# Patient Record
Sex: Male | Born: 1960 | Hispanic: Yes | Smoking: Former smoker
Health system: Southern US, Community
[De-identification: ages and names within clinical notes are randomized; demographics above are authoritative.]

## PROBLEM LIST (undated history)

## (undated) DIAGNOSIS — I219 Acute myocardial infarction, unspecified: Secondary | ICD-10-CM

## (undated) DIAGNOSIS — D759 Disease of blood and blood-forming organs, unspecified: Secondary | ICD-10-CM

## (undated) DIAGNOSIS — D696 Thrombocytopenia, unspecified: Secondary | ICD-10-CM

## (undated) DIAGNOSIS — R413 Other amnesia: Secondary | ICD-10-CM

## (undated) DIAGNOSIS — N189 Chronic kidney disease, unspecified: Secondary | ICD-10-CM

## (undated) DIAGNOSIS — T7840XA Allergy, unspecified, initial encounter: Secondary | ICD-10-CM

## (undated) DIAGNOSIS — E785 Hyperlipidemia, unspecified: Secondary | ICD-10-CM

## (undated) DIAGNOSIS — I10 Essential (primary) hypertension: Secondary | ICD-10-CM

## (undated) DIAGNOSIS — G4733 Obstructive sleep apnea (adult) (pediatric): Secondary | ICD-10-CM

## (undated) DIAGNOSIS — I5181 Takotsubo syndrome: Secondary | ICD-10-CM

## (undated) DIAGNOSIS — I639 Cerebral infarction, unspecified: Secondary | ICD-10-CM

## (undated) DIAGNOSIS — T4145XA Adverse effect of unspecified anesthetic, initial encounter: Secondary | ICD-10-CM

## (undated) DIAGNOSIS — T40601A Poisoning by unspecified narcotics, accidental (unintentional), initial encounter: Secondary | ICD-10-CM

## (undated) DIAGNOSIS — K5900 Constipation, unspecified: Secondary | ICD-10-CM

## (undated) DIAGNOSIS — G4730 Sleep apnea, unspecified: Secondary | ICD-10-CM

## (undated) DIAGNOSIS — R7303 Prediabetes: Secondary | ICD-10-CM

## (undated) DIAGNOSIS — J9600 Acute respiratory failure, unspecified whether with hypoxia or hypercapnia: Secondary | ICD-10-CM

## (undated) HISTORY — DX: Allergy, unspecified, initial encounter: T78.40XA

## (undated) HISTORY — DX: Acute myocardial infarction, unspecified: I21.9

## (undated) HISTORY — DX: Essential (primary) hypertension: I10

## (undated) HISTORY — DX: Cerebral infarction, unspecified: I63.9

## (undated) HISTORY — PX: KNEE SURGERY: SHX244

## (undated) HISTORY — DX: Sleep apnea, unspecified: G47.30

## (undated) HISTORY — DX: Takotsubo syndrome: I51.81

## (undated) HISTORY — PX: ACHILLES TENDON REPAIR: SUR1153

## (undated) HISTORY — PX: CORONARY ARTERY BYPASS GRAFT: SHX141

## (undated) HISTORY — PX: TRANSTHORACIC ECHOCARDIOGRAM: SHX275

---

## 1997-11-20 ENCOUNTER — Emergency Department (HOSPITAL_COMMUNITY): Admission: EM | Admit: 1997-11-20 | Discharge: 1997-11-20 | Payer: Self-pay | Admitting: Emergency Medicine

## 1999-04-14 ENCOUNTER — Encounter: Payer: Self-pay | Admitting: Family Medicine

## 1999-04-14 ENCOUNTER — Inpatient Hospital Stay (HOSPITAL_COMMUNITY): Admission: EM | Admit: 1999-04-14 | Discharge: 1999-04-17 | Payer: Self-pay | Admitting: Emergency Medicine

## 1999-04-16 ENCOUNTER — Encounter: Payer: Self-pay | Admitting: Family Medicine

## 1999-04-24 ENCOUNTER — Encounter: Admission: RE | Admit: 1999-04-24 | Discharge: 1999-04-24 | Payer: Self-pay | Admitting: Family Medicine

## 1999-04-28 ENCOUNTER — Encounter: Admission: RE | Admit: 1999-04-28 | Discharge: 1999-04-28 | Payer: Self-pay | Admitting: Family Medicine

## 2000-06-12 ENCOUNTER — Emergency Department (HOSPITAL_COMMUNITY): Admission: EM | Admit: 2000-06-12 | Discharge: 2000-06-12 | Payer: Self-pay | Admitting: Emergency Medicine

## 2005-09-25 ENCOUNTER — Ambulatory Visit (HOSPITAL_COMMUNITY): Admission: RE | Admit: 2005-09-25 | Discharge: 2005-09-25 | Payer: Self-pay | Admitting: Chiropractic Medicine

## 2005-10-14 ENCOUNTER — Ambulatory Visit (HOSPITAL_COMMUNITY): Admission: RE | Admit: 2005-10-14 | Discharge: 2005-10-14 | Payer: Self-pay | Admitting: Orthopedic Surgery

## 2007-12-10 ENCOUNTER — Ambulatory Visit (HOSPITAL_COMMUNITY): Admission: RE | Admit: 2007-12-10 | Discharge: 2007-12-10 | Payer: Self-pay | Admitting: Family Medicine

## 2010-06-30 ENCOUNTER — Encounter: Payer: Self-pay | Admitting: Family Medicine

## 2010-10-24 NOTE — Op Note (Signed)
NAMEPADRAIG, NHAN NO.:  [id number]   MEDICAL RECORD NO.:  [id number]          PATIENT TYPE:  AMB   LOCATION:  DAY                          FACILITY:  [facility]   PHYSICIAN:  Ollen Gross, M.D.    DATE OF BIRTH:  [birth year]   DATE OF PROCEDURE:  10/14/2005  DATE OF DISCHARGE:                                 OPERATIVE REPORT   PREOPERATIVE DIAGNOSIS:  Left knee medial meniscal tear.   POSTOPERATIVE DIAGNOSIS:  Left knee medial meniscal tear.   PROCEDURE:  Left knee arthroscopy with meniscal debridement.   SURGEON:  Ollen Gross, M.D.   ASSISTANT:  No assistant.   ANESTHESIA:  General.   ESTIMATED BLOOD LOSS:  Minimal.   DRAINS:  None.   COMPLICATIONS:  None.   CONDITION:  Stable to recovery.   BRIEF CLINICAL NOTE:  Arn is a 50 year old male with an approximately 2  month history of left knee pain and mechanical symptoms.  Exam and history  are consistent with meniscal tear confirmed by MRI. He presents now for  arthroscopy with debridement.   PROCEDURE IN DETAIL:  After successful administration of general anesthetic,  the tourniquet was placed high on the left thigh and left lower extremity  prepped and draped in the usual sterile fashion.  A standard superomedial  and inferolateral incision is made, inflow cannula passed superomedial and  camera passed inferolateral.  Arthroscopic visualization proceeds.  The  undersurface of the patella and the trochlea look normal.  Medial and  lateral gutters are visualized, there is no evidence of any loose bodies.  Flexion and valgus force is applied to the knee and the medial compartment  is entered.  Immediately upon entering the medial compartment, it is noted  that he has a large tear at the body and posterior horn of the medial  meniscus which appears to be unstable.  The medial femoral condyle and  tibial plateau looked normal. A spinal needle was used to localize the  inferomedial portal, small  incision made, dilator placed and a probe placed.  The tear is unstable. This was debrided back to a stable base with baskets  and a 4.2 mm shaver.  The edges of the meniscus are then sealed with the  ArthroCare device.  Intercondylar notch is visualized, ACL appears normal.  The lateral compartment is entered and it is normal.  Arthroscopic  equipment is then removed from the inferior portals which are closed with  interrupted 4-0 nylon.  20 mL of 0.25% Marcaine with epinephrine injected  through the inflow cannula then that is removed and that portal closed with  nylon.  A bulky sterile dressing is applied.  He is awakened and transported  to recovery in stable condition.      Ollen Gross, M.D.  Electronically Signed     FA/MEDQ  D:  10/14/2005  T:  10/15/2005  Job:  [id number]

## 2012-06-08 DIAGNOSIS — D696 Thrombocytopenia, unspecified: Secondary | ICD-10-CM

## 2012-06-08 DIAGNOSIS — J9600 Acute respiratory failure, unspecified whether with hypoxia or hypercapnia: Secondary | ICD-10-CM

## 2012-06-08 DIAGNOSIS — T40601A Poisoning by unspecified narcotics, accidental (unintentional), initial encounter: Secondary | ICD-10-CM

## 2012-06-08 DIAGNOSIS — T8859XA Other complications of anesthesia, initial encounter: Secondary | ICD-10-CM

## 2012-06-08 DIAGNOSIS — N189 Chronic kidney disease, unspecified: Secondary | ICD-10-CM

## 2012-06-08 HISTORY — DX: Other complications of anesthesia, initial encounter: T88.59XA

## 2012-06-08 HISTORY — DX: Acute respiratory failure, unspecified whether with hypoxia or hypercapnia: J96.00

## 2012-06-08 HISTORY — DX: Chronic kidney disease, unspecified: N18.9

## 2012-06-08 HISTORY — DX: Poisoning by unspecified narcotics, accidental (unintentional), initial encounter: T40.601A

## 2012-06-08 HISTORY — DX: Thrombocytopenia, unspecified: D69.6

## 2012-12-29 ENCOUNTER — Encounter (HOSPITAL_COMMUNITY)
Admission: EM | Disposition: A | Discharge status: Skilled Nursing Facility | Payer: Self-pay | Source: Home / Self Care | Attending: Pulmonary Disease

## 2012-12-29 ENCOUNTER — Inpatient Hospital Stay (HOSPITAL_COMMUNITY): Payer: BC Managed Care – PPO

## 2012-12-29 ENCOUNTER — Inpatient Hospital Stay (HOSPITAL_COMMUNITY)
Admission: EM | Admit: 2012-12-29 | Discharge: 2013-01-27 | DRG: 468 | Disposition: A | Discharge status: Skilled Nursing Facility | Payer: BC Managed Care – PPO | Attending: Internal Medicine | Admitting: Internal Medicine

## 2012-12-29 ENCOUNTER — Emergency Department (HOSPITAL_COMMUNITY): Payer: BC Managed Care – PPO

## 2012-12-29 ENCOUNTER — Encounter (HOSPITAL_COMMUNITY): Payer: Self-pay | Admitting: Emergency Medicine

## 2012-12-29 DIAGNOSIS — IMO0002 Reserved for concepts with insufficient information to code with codable children: Secondary | ICD-10-CM

## 2012-12-29 DIAGNOSIS — T50995A Adverse effect of other drugs, medicaments and biological substances, initial encounter: Secondary | ICD-10-CM | POA: Diagnosis not present

## 2012-12-29 DIAGNOSIS — T40601A Poisoning by unspecified narcotics, accidental (unintentional), initial encounter: Principal | ICD-10-CM

## 2012-12-29 DIAGNOSIS — Z87891 Personal history of nicotine dependence: Secondary | ICD-10-CM

## 2012-12-29 DIAGNOSIS — R570 Cardiogenic shock: Secondary | ICD-10-CM

## 2012-12-29 DIAGNOSIS — Z6832 Body mass index (BMI) 32.0-32.9, adult: Secondary | ICD-10-CM

## 2012-12-29 DIAGNOSIS — E785 Hyperlipidemia, unspecified: Secondary | ICD-10-CM | POA: Diagnosis present

## 2012-12-29 DIAGNOSIS — G9340 Encephalopathy, unspecified: Secondary | ICD-10-CM | POA: Diagnosis present

## 2012-12-29 DIAGNOSIS — D62 Acute posthemorrhagic anemia: Secondary | ICD-10-CM | POA: Diagnosis not present

## 2012-12-29 DIAGNOSIS — I498 Other specified cardiac arrhythmias: Secondary | ICD-10-CM | POA: Diagnosis present

## 2012-12-29 DIAGNOSIS — E872 Acidosis, unspecified: Secondary | ICD-10-CM

## 2012-12-29 DIAGNOSIS — Z4889 Encounter for other specified surgical aftercare: Secondary | ICD-10-CM

## 2012-12-29 DIAGNOSIS — I2510 Atherosclerotic heart disease of native coronary artery without angina pectoris: Secondary | ICD-10-CM | POA: Diagnosis present

## 2012-12-29 DIAGNOSIS — E875 Hyperkalemia: Secondary | ICD-10-CM

## 2012-12-29 DIAGNOSIS — T40601D Poisoning by unspecified narcotics, accidental (unintentional), subsequent encounter: Secondary | ICD-10-CM

## 2012-12-29 DIAGNOSIS — I4729 Other ventricular tachycardia: Secondary | ICD-10-CM | POA: Diagnosis present

## 2012-12-29 DIAGNOSIS — I10 Essential (primary) hypertension: Secondary | ICD-10-CM

## 2012-12-29 DIAGNOSIS — D759 Disease of blood and blood-forming organs, unspecified: Secondary | ICD-10-CM | POA: Diagnosis present

## 2012-12-29 DIAGNOSIS — I428 Other cardiomyopathies: Secondary | ICD-10-CM | POA: Diagnosis present

## 2012-12-29 DIAGNOSIS — A419 Sepsis, unspecified organism: Secondary | ICD-10-CM

## 2012-12-29 DIAGNOSIS — E8729 Other acidosis: Secondary | ICD-10-CM

## 2012-12-29 DIAGNOSIS — Z9989 Dependence on other enabling machines and devices: Secondary | ICD-10-CM

## 2012-12-29 DIAGNOSIS — N170 Acute kidney failure with tubular necrosis: Secondary | ICD-10-CM

## 2012-12-29 DIAGNOSIS — R69 Illness, unspecified: Secondary | ICD-10-CM

## 2012-12-29 DIAGNOSIS — T40601S Poisoning by unspecified narcotics, accidental (unintentional), sequela: Secondary | ICD-10-CM

## 2012-12-29 DIAGNOSIS — N179 Acute kidney failure, unspecified: Secondary | ICD-10-CM

## 2012-12-29 DIAGNOSIS — B353 Tinea pedis: Secondary | ICD-10-CM

## 2012-12-29 DIAGNOSIS — I472 Ventricular tachycardia, unspecified: Secondary | ICD-10-CM | POA: Diagnosis present

## 2012-12-29 DIAGNOSIS — I509 Heart failure, unspecified: Secondary | ICD-10-CM | POA: Diagnosis present

## 2012-12-29 DIAGNOSIS — R4182 Altered mental status, unspecified: Secondary | ICD-10-CM

## 2012-12-29 DIAGNOSIS — G4733 Obstructive sleep apnea (adult) (pediatric): Secondary | ICD-10-CM

## 2012-12-29 DIAGNOSIS — D72829 Elevated white blood cell count, unspecified: Secondary | ICD-10-CM | POA: Diagnosis present

## 2012-12-29 DIAGNOSIS — J9600 Acute respiratory failure, unspecified whether with hypoxia or hypercapnia: Secondary | ICD-10-CM

## 2012-12-29 DIAGNOSIS — E8881 Metabolic syndrome: Secondary | ICD-10-CM | POA: Diagnosis present

## 2012-12-29 DIAGNOSIS — D696 Thrombocytopenia, unspecified: Secondary | ICD-10-CM | POA: Diagnosis present

## 2012-12-29 DIAGNOSIS — N5089 Other specified disorders of the male genital organs: Secondary | ICD-10-CM | POA: Diagnosis not present

## 2012-12-29 DIAGNOSIS — R197 Diarrhea, unspecified: Secondary | ICD-10-CM | POA: Diagnosis not present

## 2012-12-29 DIAGNOSIS — I5021 Acute systolic (congestive) heart failure: Secondary | ICD-10-CM

## 2012-12-29 DIAGNOSIS — T400X1A Poisoning by opium, accidental (unintentional), initial encounter: Secondary | ICD-10-CM | POA: Diagnosis present

## 2012-12-29 DIAGNOSIS — I469 Cardiac arrest, cause unspecified: Secondary | ICD-10-CM

## 2012-12-29 DIAGNOSIS — D649 Anemia, unspecified: Secondary | ICD-10-CM

## 2012-12-29 DIAGNOSIS — R6521 Severe sepsis with septic shock: Secondary | ICD-10-CM

## 2012-12-29 DIAGNOSIS — F1910 Other psychoactive substance abuse, uncomplicated: Secondary | ICD-10-CM | POA: Diagnosis present

## 2012-12-29 DIAGNOSIS — R5381 Other malaise: Secondary | ICD-10-CM

## 2012-12-29 DIAGNOSIS — N186 End stage renal disease: Secondary | ICD-10-CM | POA: Diagnosis not present

## 2012-12-29 DIAGNOSIS — I252 Old myocardial infarction: Secondary | ICD-10-CM

## 2012-12-29 DIAGNOSIS — Z7982 Long term (current) use of aspirin: Secondary | ICD-10-CM

## 2012-12-29 DIAGNOSIS — E878 Other disorders of electrolyte and fluid balance, not elsewhere classified: Secondary | ICD-10-CM | POA: Diagnosis not present

## 2012-12-29 DIAGNOSIS — K7200 Acute and subacute hepatic failure without coma: Secondary | ICD-10-CM | POA: Diagnosis present

## 2012-12-29 DIAGNOSIS — K5900 Constipation, unspecified: Secondary | ICD-10-CM | POA: Diagnosis not present

## 2012-12-29 DIAGNOSIS — R680 Hypothermia, not associated with low environmental temperature: Secondary | ICD-10-CM | POA: Diagnosis present

## 2012-12-29 DIAGNOSIS — E8809 Other disorders of plasma-protein metabolism, not elsewhere classified: Secondary | ICD-10-CM | POA: Diagnosis present

## 2012-12-29 DIAGNOSIS — I120 Hypertensive chronic kidney disease with stage 5 chronic kidney disease or end stage renal disease: Secondary | ICD-10-CM | POA: Diagnosis not present

## 2012-12-29 DIAGNOSIS — R6520 Severe sepsis without septic shock: Secondary | ICD-10-CM

## 2012-12-29 DIAGNOSIS — A4901 Methicillin susceptible Staphylococcus aureus infection, unspecified site: Secondary | ICD-10-CM | POA: Diagnosis present

## 2012-12-29 DIAGNOSIS — Z79899 Other long term (current) drug therapy: Secondary | ICD-10-CM

## 2012-12-29 DIAGNOSIS — D539 Nutritional anemia, unspecified: Secondary | ICD-10-CM | POA: Diagnosis present

## 2012-12-29 DIAGNOSIS — I517 Cardiomegaly: Secondary | ICD-10-CM

## 2012-12-29 DIAGNOSIS — D6959 Other secondary thrombocytopenia: Secondary | ICD-10-CM | POA: Diagnosis not present

## 2012-12-29 DIAGNOSIS — L299 Pruritus, unspecified: Secondary | ICD-10-CM | POA: Diagnosis not present

## 2012-12-29 DIAGNOSIS — J690 Pneumonitis due to inhalation of food and vomit: Secondary | ICD-10-CM | POA: Diagnosis present

## 2012-12-29 DIAGNOSIS — R578 Other shock: Secondary | ICD-10-CM | POA: Diagnosis present

## 2012-12-29 HISTORY — PX: TRANSTHORACIC ECHOCARDIOGRAM: SHX275

## 2012-12-29 HISTORY — PX: CARDIAC CATHETERIZATION: SHX172

## 2012-12-29 HISTORY — DX: Obstructive sleep apnea (adult) (pediatric): G47.33

## 2012-12-29 HISTORY — PX: LEFT AND RIGHT HEART CATHETERIZATION WITH CORONARY ANGIOGRAM: SHX5449

## 2012-12-29 HISTORY — DX: Hyperlipidemia, unspecified: E78.5

## 2012-12-29 HISTORY — DX: Cerebral infarction, unspecified: I63.9

## 2012-12-29 LAB — POCT I-STAT 3, ART BLOOD GAS (G3+)
Acid-base deficit: 9 mmol/L — ABNORMAL HIGH (ref 0.0–2.0)
Bicarbonate: 22.7 mEq/L (ref 20.0–24.0)
O2 Saturation: 97 %
O2 Saturation: 93 %
Patient temperature: 98.6
TCO2: 25 mmol/L (ref 0–100)
TCO2: 16 mmol/L (ref 0–100)
pCO2 arterial: 35.7 mmHg (ref 35.0–45.0)
pO2, Arterial: 122 mmHg — ABNORMAL HIGH (ref 80.0–100.0)

## 2012-12-29 LAB — URINE MICROSCOPIC-ADD ON

## 2012-12-29 LAB — URINALYSIS, ROUTINE W REFLEX MICROSCOPIC
Bilirubin Urine: NEGATIVE
Glucose, UA: 250 mg/dL — AB
Ketones, ur: NEGATIVE mg/dL
Leukocytes, UA: NEGATIVE
Leukocytes, UA: NEGATIVE
Nitrite: NEGATIVE
Protein, ur: >300 mg/dL — AB
Specific Gravity, Urine: 1.023 (ref 1.005–1.030)
Specific Gravity, Urine: 1.011 (ref 1.005–1.030)
Urobilinogen, UA: 1 mg/dL (ref 0.0–1.0)
pH: 6 (ref 5.0–8.0)
pH: 7 (ref 5.0–8.0)

## 2012-12-29 LAB — COMPREHENSIVE METABOLIC PANEL
BUN: 19 mg/dL (ref 6–23)
CO2: 21 mEq/L (ref 19–32)
Calcium: 8.4 mg/dL (ref 8.4–10.5)
GFR calc Af Amer: 49 mL/min — ABNORMAL LOW (ref >90)
GFR calc non Af Amer: 42 mL/min — ABNORMAL LOW (ref >90)
Glucose, Bld: 163 mg/dL — ABNORMAL HIGH (ref 70–99)
Total Protein: 7.1 g/dL (ref 6.0–8.3)

## 2012-12-29 LAB — CBC WITH DIFFERENTIAL/PLATELET
Basophils Relative: 1 % (ref 0–1)
Eosinophils Absolute: 0 10*3/uL (ref 0.0–0.7)
Eosinophils Relative: 0 % (ref 0–5)
HCT: 52.2 % — ABNORMAL HIGH (ref 39.0–52.0)
Hemoglobin: 16.3 g/dL (ref 13.0–17.0)
Lymphs Abs: 2.7 10*3/uL (ref 0.7–4.0)
MCH: 33.3 pg (ref 26.0–34.0)
MCHC: 31.2 g/dL (ref 30.0–36.0)
MCV: 106.5 fL — ABNORMAL HIGH (ref 78.0–100.0)
Monocytes Absolute: 0.9 10*3/uL (ref 0.1–1.0)
Neutro Abs: 14 10*3/uL — ABNORMAL HIGH (ref 1.7–7.7)

## 2012-12-29 LAB — RAPID URINE DRUG SCREEN, HOSP PERFORMED
Barbiturates: NOT DETECTED
Benzodiazepines: NOT DETECTED
Cocaine: NOT DETECTED

## 2012-12-29 LAB — ACETAMINOPHEN LEVEL: Acetaminophen (Tylenol), Serum: <15 ug/mL (ref 10–30)

## 2012-12-29 LAB — LACTIC ACID, PLASMA: Lactic Acid, Venous: 9.4 mmol/L — ABNORMAL HIGH (ref 0.5–2.2)

## 2012-12-29 SURGERY — LEFT AND RIGHT HEART CATHETERIZATION WITH CORONARY ANGIOGRAM
Laterality: Bilateral

## 2012-12-29 MED ORDER — INSULIN ASPART 100 UNIT/ML ~~LOC~~ SOLN
2.0000 [IU] | SUBCUTANEOUS | Status: DC
  Administered 2012-12-30: 4 [IU] via SUBCUTANEOUS
  Filled 2012-12-29: qty 1

## 2012-12-29 MED ORDER — VASOPRESSIN 20 UNIT/ML IJ SOLN
0.0300 [IU]/min | INTRAVENOUS | Status: DC
  Administered 2012-12-29: 0.03 [IU]/min via INTRAVENOUS
  Filled 2012-12-29: qty 2.5

## 2012-12-29 MED ORDER — PROPOFOL 10 MG/ML IV EMUL
INTRAVENOUS | Status: AC
  Filled 2012-12-29: qty 100

## 2012-12-29 MED ORDER — SODIUM CHLORIDE 0.9 % IV SOLN
25.0000 ug/h | INTRAVENOUS | Status: DC
  Administered 2012-12-30 – 2012-12-31 (×4): 300 ug/h via INTRAVENOUS
  Administered 2013-01-01: 200 ug/h via INTRAVENOUS
  Administered 2013-01-01: 160 ug/h via INTRAVENOUS
  Administered 2013-01-02: 200 ug/h via INTRAVENOUS
  Administered 2013-01-03 – 2013-01-05 (×3): 100 ug/h via INTRAVENOUS
  Administered 2013-01-06 – 2013-01-07 (×2): 75 ug/h via INTRAVENOUS
  Filled 2012-12-29 (×13): qty 50

## 2012-12-29 MED ORDER — SODIUM CHLORIDE 0.9 % IJ SOLN
3.0000 mL | Freq: Two times a day (BID) | INTRAMUSCULAR | Status: DC
  Administered 2012-12-30 – 2013-01-01 (×5): 3 mL via INTRAVENOUS

## 2012-12-29 MED ORDER — HEPARIN SODIUM (PORCINE) 1000 UNIT/ML IJ SOLN
INTRAMUSCULAR | Status: AC
  Filled 2012-12-29: qty 1

## 2012-12-29 MED ORDER — SODIUM CHLORIDE 0.9 % IV SOLN
1.0000 mg/h | INTRAVENOUS | Status: DC
  Administered 2012-12-29: 1 mg/h via INTRAVENOUS
  Filled 2012-12-29: qty 10

## 2012-12-29 MED ORDER — HEPARIN (PORCINE) IN NACL 2-0.9 UNIT/ML-% IJ SOLN
INTRAMUSCULAR | Status: AC
  Filled 2012-12-29: qty 1000

## 2012-12-29 MED ORDER — VANCOMYCIN HCL 10 G IV SOLR
2000.0000 mg | INTRAVENOUS | Status: DC
  Filled 2012-12-29: qty 2000

## 2012-12-29 MED ORDER — SODIUM CHLORIDE 0.9 % IV SOLN: INTRAVENOUS | Status: DC

## 2012-12-29 MED ORDER — PROPOFOL 10 MG/ML IV EMUL: 5.0000 ug/kg/min | Freq: Once | INTRAVENOUS | Status: DC

## 2012-12-29 MED ORDER — FENTANYL CITRATE 0.05 MG/ML IJ SOLN
100.0000 ug | Freq: Once | INTRAMUSCULAR | Status: AC
  Administered 2012-12-29: 100 ug via INTRAVENOUS
  Filled 2012-12-29: qty 2

## 2012-12-29 MED ORDER — DOBUTAMINE IN D5W 4-5 MG/ML-% IV SOLN
2.5000 ug/kg/min | INTRAVENOUS | Status: DC
  Administered 2012-12-31: 2.5 ug/kg/min via INTRAVENOUS
  Filled 2012-12-29 (×4): qty 250

## 2012-12-29 MED ORDER — NOREPINEPHRINE BITARTRATE 1 MG/ML IJ SOLN
2.0000 ug/min | Freq: Once | INTRAVENOUS | Status: AC
  Administered 2012-12-29: 2 ug/min via INTRAVENOUS
  Filled 2012-12-29: qty 4

## 2012-12-29 MED ORDER — VANCOMYCIN HCL 10 G IV SOLR
1250.0000 mg | Freq: Two times a day (BID) | INTRAVENOUS | Status: DC
  Filled 2012-12-29 (×2): qty 1250

## 2012-12-29 MED ORDER — PHENYLEPHRINE HCL 10 MG/ML IJ SOLN
30.0000 ug/min | INTRAVENOUS | Status: DC
  Administered 2012-12-29: 100 ug/min via INTRAVENOUS
  Filled 2012-12-29 (×2): qty 1

## 2012-12-29 MED ORDER — FENTANYL BOLUS VIA INFUSION
25.0000 ug | Freq: Four times a day (QID) | INTRAVENOUS | Status: DC | PRN
  Administered 2013-01-02: 100 ug via INTRAVENOUS
  Administered 2013-01-02: 50 ug via INTRAVENOUS
  Filled 2012-12-29 (×3): qty 100

## 2012-12-29 MED ORDER — HEPARIN SODIUM (PORCINE) 5000 UNIT/ML IJ SOLN
25000.0000 [IU] | INTRAVENOUS | Status: DC
  Administered 2012-12-29 – 2012-12-31 (×2): 25000 [IU]
  Filled 2012-12-29 (×5): qty 5

## 2012-12-29 MED ORDER — NALOXONE HCL 0.4 MG/ML IJ SOLN
INTRAMUSCULAR | Status: AC
  Filled 2012-12-29: qty 2

## 2012-12-29 MED ORDER — DEXTROSE 50 % IV SOLN
INTRAVENOUS | Status: AC | PRN
  Administered 2012-12-29: 1 via INTRAVENOUS

## 2012-12-29 MED ORDER — ROCURONIUM BROMIDE 50 MG/5ML IV SOLN
INTRAVENOUS | Status: AC | PRN
  Administered 2012-12-29: 100 mg via INTRAVENOUS

## 2012-12-29 MED ORDER — HYDROCORTISONE SOD SUCCINATE 100 MG IJ SOLR
50.0000 mg | Freq: Four times a day (QID) | INTRAMUSCULAR | Status: DC
  Administered 2012-12-30 – 2012-12-31 (×6): 50 mg via INTRAVENOUS
  Filled 2012-12-29 (×8): qty 1
  Filled 2012-12-29: qty 2
  Filled 2012-12-29 (×3): qty 1

## 2012-12-29 MED ORDER — ACETAMINOPHEN 325 MG PO TABS: 650.0000 mg | ORAL_TABLET | ORAL | Status: DC | PRN

## 2012-12-29 MED ORDER — NALOXONE HCL 0.4 MG/ML IJ SOLN
INTRAMUSCULAR | Status: AC
  Administered 2012-12-29: 0.8 mg
  Filled 2012-12-29: qty 2

## 2012-12-29 MED ORDER — MAGNESIUM SULFATE 50 % IJ SOLN
INTRAMUSCULAR | Status: AC | PRN
  Administered 2012-12-29 (×2): 2 g via INTRAVENOUS

## 2012-12-29 MED ORDER — MIDAZOLAM BOLUS VIA INFUSION
1.0000 mg | INTRAVENOUS | Status: DC | PRN
  Administered 2013-01-02: 1 mg via INTRAVENOUS
  Administered 2013-01-02: 2 mg via INTRAVENOUS
  Filled 2012-12-29: qty 2

## 2012-12-29 MED ORDER — SODIUM POLYSTYRENE SULFONATE 15 GM/60ML PO SUSP: 30.0000 g | Freq: Once | ORAL | Status: DC

## 2012-12-29 MED ORDER — ONDANSETRON HCL 4 MG/2ML IJ SOLN: 4.0000 mg | Freq: Four times a day (QID) | INTRAMUSCULAR | Status: DC | PRN

## 2012-12-29 MED ORDER — SODIUM CHLORIDE 0.9 % IV SOLN
250.0000 mL | INTRAVENOUS | Status: DC | PRN
  Administered 2012-12-30: 250 mL via INTRAVENOUS
  Administered 2013-01-09: 08:00:00 via INTRAVENOUS

## 2012-12-29 MED ORDER — HYDROCORTISONE SOD SUCCINATE 100 MG IJ SOLR
100.0000 mg | Freq: Once | INTRAMUSCULAR | Status: AC
  Administered 2012-12-29: 100 mg via INTRAVENOUS

## 2012-12-29 MED ORDER — ATROPINE SULFATE 1 MG/ML IJ SOLN
INTRAMUSCULAR | Status: AC | PRN
  Administered 2012-12-29: 1 mg via INTRAVENOUS

## 2012-12-29 MED ORDER — SODIUM CHLORIDE 0.9 % IV SOLN
50.0000 ug/h | INTRAVENOUS | Status: DC
  Administered 2012-12-29: 50 ug/h via INTRAVENOUS
  Filled 2012-12-29: qty 50

## 2012-12-29 MED ORDER — PIPERACILLIN-TAZOBACTAM 4.5 G IVPB: 4.5000 g | Freq: Once | INTRAVENOUS | Status: DC

## 2012-12-29 MED ORDER — EPINEPHRINE HCL 0.1 MG/ML IJ SOSY
PREFILLED_SYRINGE | INTRAMUSCULAR | Status: AC | PRN
  Administered 2012-12-29: 1 mg via INTRAVENOUS
  Administered 2012-12-29: 0.5 mg via INTRAVENOUS
  Administered 2012-12-29 (×4): 1 mg via INTRAVENOUS

## 2012-12-29 MED ORDER — SODIUM BICARBONATE 8.4 % IV SOLN
INTRAVENOUS | Status: DC
  Administered 2012-12-29 – 2012-12-30 (×3): via INTRAVENOUS
  Filled 2012-12-29 (×7): qty 150

## 2012-12-29 MED ORDER — SODIUM CHLORIDE 0.9 % IV BOLUS (SEPSIS)
1000.0000 mL | Freq: Once | INTRAVENOUS | Status: AC
  Administered 2012-12-29: 1000 mL via INTRAVENOUS

## 2012-12-29 MED ORDER — SODIUM BICARBONATE 8.4 % IV SOLN
INTRAVENOUS | Status: AC | PRN
  Administered 2012-12-29 (×4): 50 meq via INTRAVENOUS

## 2012-12-29 MED ORDER — PIPERACILLIN-TAZOBACTAM 3.375 G IVPB
3.3750 g | Freq: Three times a day (TID) | INTRAVENOUS | Status: DC
  Administered 2012-12-30 – 2012-12-31 (×4): 3.375 g via INTRAVENOUS
  Filled 2012-12-29 (×6): qty 50

## 2012-12-29 MED ORDER — HEPARIN SODIUM (PORCINE) 5000 UNIT/ML IJ SOLN: 5000.0000 [IU] | Freq: Three times a day (TID) | INTRAMUSCULAR | Status: DC

## 2012-12-29 MED ORDER — NITROGLYCERIN 0.2 MG/ML ON CALL CATH LAB
INTRAVENOUS | Status: AC
  Filled 2012-12-29: qty 1

## 2012-12-29 MED ORDER — SODIUM CHLORIDE 0.9 % IJ SOLN: 3.0000 mL | INTRAMUSCULAR | Status: DC | PRN

## 2012-12-29 MED ORDER — PANTOPRAZOLE SODIUM 40 MG IV SOLR
40.0000 mg | Freq: Every day | INTRAVENOUS | Status: DC
  Administered 2012-12-30 – 2012-12-31 (×3): 40 mg via INTRAVENOUS
  Filled 2012-12-29 (×4): qty 40

## 2012-12-29 MED ORDER — SODIUM CHLORIDE 0.9 % IV SOLN
1.0000 mg/h | INTRAVENOUS | Status: DC
  Administered 2012-12-29: 2 mg/h via INTRAVENOUS
  Administered 2012-12-30: 5 mg/h via INTRAVENOUS
  Administered 2012-12-30: 2 mg/h via INTRAVENOUS
  Administered 2012-12-31: 7 mg/h via INTRAVENOUS
  Administered 2012-12-31 (×2): 5 mg/h via INTRAVENOUS
  Administered 2013-01-01 – 2013-01-02 (×3): 3 mg/h via INTRAVENOUS
  Administered 2013-01-03: 1.5 mg/h via INTRAVENOUS
  Filled 2012-12-29 (×10): qty 10

## 2012-12-29 MED ORDER — THIAMINE HCL 100 MG/ML IJ SOLN
Freq: Once | INTRAVENOUS | Status: DC
  Filled 2012-12-29: qty 1000

## 2012-12-29 MED ORDER — SODIUM CHLORIDE 0.9 % IV SOLN: 250.0000 mL | INTRAVENOUS | Status: DC | PRN

## 2012-12-29 MED ORDER — DEXTROSE 5 % IV SOLN
300.0000 mg | INTRAVENOUS | Status: DC | PRN
  Administered 2012-12-29: 300 mg via INTRAVENOUS

## 2012-12-29 MED ORDER — CALCIUM CHLORIDE 10 % IV SOLN
INTRAVENOUS | Status: AC | PRN
  Administered 2012-12-29: 1 g via INTRAVENOUS

## 2012-12-29 MED ORDER — PIPERACILLIN-TAZOBACTAM 3.375 G IVPB 30 MIN
3.3750 g | Freq: Once | INTRAVENOUS | Status: DC
  Filled 2012-12-29: qty 50

## 2012-12-29 MED ORDER — MIDAZOLAM HCL 2 MG/ML PO SYRP: 2.0000 mg | ORAL_SOLUTION | Freq: Once | ORAL | Status: DC

## 2012-12-29 MED ORDER — NOREPINEPHRINE BITARTRATE 1 MG/ML IJ SOLN
2.0000 ug/min | INTRAVENOUS | Status: DC
  Administered 2012-12-29: 10 ug/min via INTRAVENOUS
  Administered 2012-12-31: 2 ug/min via INTRAVENOUS
  Filled 2012-12-29 (×3): qty 4

## 2012-12-29 MED ORDER — VANCOMYCIN HCL 10 G IV SOLR
2000.0000 mg | Freq: Once | INTRAVENOUS | Status: AC
  Administered 2012-12-30: 2000 mg via INTRAVENOUS
  Filled 2012-12-29: qty 2000

## 2012-12-29 MED ORDER — LIDOCAINE HCL (PF) 1 % IJ SOLN
INTRAMUSCULAR | Status: AC
  Filled 2012-12-29: qty 30

## 2012-12-29 NOTE — Code Documentation (Signed)
Shock delivered. 150j. Compressions resumed

## 2012-12-29 NOTE — Procedures (Signed)
Arterial Catheter Insertion Procedure Note Nicholas Potts [id number] [birth year]  Procedure: Insertion of Arterial Catheter  Indications: Blood pressure monitoring  Procedure Details Consent: Unable to obtain consent because of emergent medical necessity. Time Out: Verified patient identification, verified procedure, site/side was marked, verified correct patient position, special equipment/implants available, medications/allergies/relevent history reviewed, required imaging and test results available.  Performed  Maximum sterile technique was used including antiseptics, cap, gloves, gown, hand hygiene, mask and sheet. Skin prep: Chlorhexidine; local anesthetic administered 20 gauge catheter was inserted into right femoral artery using the Seldinger technique.  Evaluation Blood flow good; BP tracing good. Complications: No apparent complications.   Procedure performed under direct supervision of Dr. Molli Knock and with ultrasound guidance for real time vessel cannulation.     Nicholas Brim, NP-C [facility] Pgr: [phone] or [phone]   12/29/2012

## 2012-12-29 NOTE — Significant Event (Signed)
Add Protonix for SUP.  Coralyn Helling, MD 12/29/2012, 10:55 PM

## 2012-12-29 NOTE — Progress Notes (Signed)
Ongoing support for spouse, children, and other family members and friends of the patient. Large gathering of support. Family is very appreciative and respectful of staff efforts for helping the patient. Presence; listening; facilitating communication between staff and family. Good spiritual support also from patient's pastors. Family is very devote in their faith and practise as Jehov[employer]'s Witnesses.

## 2012-12-29 NOTE — H&P (View-Only) (Signed)
HPI: 52 year old male for evaluation of shock. Note at the time of this evaluation the patient was intubated and had been coded 3 separate times. His history is obtained through discussions with critical care medicine and with his family. The patient had repair of his Achilles tendon 2 days prior to admission. He apparently had some difficulties with respiration postoperatively but otherwise did well. He has been taking pain medication as instructed based on his family's report. He was last seen lucid at 9 AM today. At approximately 11:30 AM his family found him unresponsive and "white foam coming from his mouth". He apparently was not complaining of dyspnea or chest pain since his surgery. EMS was called and he was brought to the emergency room where he suffered cardiac arrest 3 separate times. There is note of PEA arrest. I was called to perform an echocardiogram to assess his right heart with concern for pulmonary embolus. Upon my arrival the patient is in shock on 2 pressors and intubated and unresponsive.   (Not in a hospital admission)  No Known Allergies   Past Medical History  Diagnosis Date  . Hyperlipidemia   . OSA (obstructive sleep apnea)     Past Surgical History  Procedure Laterality Date  . Achilles tendon repair      History   Social History  . Marital Status: Married    Spouse Name: N/A    Number of Children: N/A  . Years of Education: N/A   Occupational History  . Not on file.   Social History Main Topics  . Smoking status: Not on file  . Smokeless tobacco: Not on file  . Alcohol Use: Yes     Comment: History of heavy use but only social at present  . Drug Use: Yes     Comment: Prior cocaine use but none since 2000  . Sexually Active: Not on file   Other Topics Concern  . Not on file   Social History Narrative  . No narrative on file    No family history on file.  ROS:  Unobtainable as patient is intubated.  Physical Exam:   Height 6' (1.829 m),  weight 268 lb (121.564 kg).  General:  Well developed/intubated Skin warm/dry HEENT-normal/normal eyelids Neck carotid upstroke bilaterally; no bruits chest - CTA anteriorly CV - regular rhythm and tachycardic/normal S1 and S2; no murmurs, rubs or gallops;  PMI nondisplaced Abdomen -NT/ND, no HSM, no mass, + bowel sounds, no bruit Femoral pulses difficult to palpate. Extremities-right foot in cast. Trace edema on the left. Neuro-not obtainable as patient is intubated.  ECG sinus rhythm with S1 Q 3.  Results for orders placed during the hospital encounter of 12/29/12 (from the past 48 hour(s))  CBC WITH DIFFERENTIAL     Status: Abnormal   Collection Time    12/29/12  2:30 PM      Result Value Range   WBC 17.8 (*) 4.0 - 10.5 K/uL   RBC 4.90  4.22 - 5.81 MIL/uL   Hemoglobin 16.3  13.0 - 17.0 g/dL   HCT 78.4 (*) 69.6 - 29.5 %   MCV 106.5 (*) 78.0 - 100.0 fL   MCH 33.3  26.0 - 34.0 pg   MCHC 31.2  30.0 - 36.0 g/dL   RDW 28.4  13.2 - 44.0 %   Platelets 180  150 - 400 K/uL   Neutrophils Relative % 79 (*) 43 - 77 %   Lymphocytes Relative 15  12 - 46 %   Monocytes  Relative 5  3 - 12 %   Eosinophils Relative 0  0 - 5 %   Basophils Relative 1  0 - 1 %   Neutro Abs 14.0 (*) 1.7 - 7.7 K/uL   Lymphs Abs 2.7  0.7 - 4.0 K/uL   Monocytes Absolute 0.9  0.1 - 1.0 K/uL   Eosinophils Absolute 0.0  0.0 - 0.7 K/uL   Basophils Absolute 0.2 (*) 0.0 - 0.1 K/uL   WBC Morphology MILD LEFT SHIFT (1-5% METAS, OCC MYELO, OCC BANDS)    COMPREHENSIVE METABOLIC PANEL     Status: Abnormal   Collection Time    12/29/12  2:30 PM      Result Value Range   Sodium 137  135 - 145 mEq/L   Potassium 6.2 (*) 3.5 - 5.1 mEq/L   Comment: HEMOLYSIS AT THIS LEVEL MAY AFFECT RESULT   Chloride 97  96 - 112 mEq/L   CO2 21  19 - 32 mEq/L   Glucose, Bld 163 (*) 70 - 99 mg/dL   BUN 19  6 - 23 mg/dL   Creatinine, Ser 1.61 (*) 0.50 - 1.35 mg/dL   Calcium 8.4  8.4 - 09.6 mg/dL   Total Protein 7.1  6.0 - 8.3 g/dL    Albumin 3.2 (*) 3.5 - 5.2 g/dL   AST 045 (*) 0 - 37 U/L   ALT 791 (*) 0 - 53 U/L   Alkaline Phosphatase 121 (*) 39 - 117 U/L   Total Bilirubin 0.6  0.3 - 1.2 mg/dL   GFR calc non Af Amer 42 (*) >90 mL/min   GFR calc Af Amer 49 (*) >90 mL/min   Comment:            The eGFR has been calculated     using the CKD EPI equation.     This calculation has not been     validated in all clinical     situations.     eGFR's persistently     <90 mL/min signify     possible Chronic Kidney Disease.  LACTIC ACID, PLASMA     Status: Abnormal   Collection Time    12/29/12  2:30 PM      Result Value Range   Lactic Acid, Venous 9.4 (*) 0.5 - 2.2 mmol/L  ETHANOL     Status: None   Collection Time    12/29/12  2:30 PM      Result Value Range   Alcohol, Ethyl (B) <11  0 - 11 mg/dL   Comment:            LOWEST DETECTABLE LIMIT FOR     SERUM ALCOHOL IS 11 mg/dL     FOR MEDICAL PURPOSES ONLY  ACETAMINOPHEN LEVEL     Status: None   Collection Time    12/29/12  2:30 PM      Result Value Range   Acetaminophen (Tylenol), Serum <15.0  10 - 30 ug/mL   Comment:            THERAPEUTIC CONCENTRATIONS VARY     SIGNIFICANTLY. A RANGE OF 10-30     ug/mL MAY BE AN EFFECTIVE     CONCENTRATION FOR MANY PATIENTS.     HOWEVER, SOME ARE BEST TREATED     AT CONCENTRATIONS OUTSIDE THIS     RANGE.     ACETAMINOPHEN CONCENTRATIONS     >150 ug/mL AT 4 HOURS AFTER     INGESTION AND >50 ug/mL AT 12  HOURS AFTER INGESTION ARE     OFTEN ASSOCIATED WITH TOXIC     REACTIONS.  URINALYSIS, ROUTINE W REFLEX MICROSCOPIC     Status: Abnormal   Collection Time    12/29/12  3:00 PM      Result Value Range   Color, Urine AMBER (*) YELLOW   Comment: BIOCHEMICALS MAY BE AFFECTED BY COLOR   APPearance TURBID (*) CLEAR   Specific Gravity, Urine 1.023  1.005 - 1.030   pH 6.0  5.0 - 8.0   Glucose, UA 100 (*) NEGATIVE mg/dL   Hgb urine dipstick LARGE (*) NEGATIVE   Bilirubin Urine NEGATIVE  NEGATIVE   Ketones, ur NEGATIVE   NEGATIVE mg/dL   Protein, ur >295 (*) NEGATIVE mg/dL   Urobilinogen, UA 1.0  0.0 - 1.0 mg/dL   Nitrite NEGATIVE  NEGATIVE   Leukocytes, UA NEGATIVE  NEGATIVE  URINE RAPID DRUG SCREEN (HOSP PERFORMED)     Status: None   Collection Time    12/29/12  3:00 PM      Result Value Range   Opiates NONE DETECTED  NONE DETECTED   Cocaine NONE DETECTED  NONE DETECTED   Benzodiazepines NONE DETECTED  NONE DETECTED   Amphetamines NONE DETECTED  NONE DETECTED   Tetrahydrocannabinol NONE DETECTED  NONE DETECTED   Barbiturates NONE DETECTED  NONE DETECTED   Comment:            DRUG SCREEN FOR MEDICAL PURPOSES     ONLY.  IF CONFIRMATION IS NEEDED     FOR ANY PURPOSE, NOTIFY LAB     WITHIN 5 DAYS.                LOWEST DETECTABLE LIMITS     FOR URINE DRUG SCREEN     Drug Class       Cutoff (ng/mL)     Amphetamine      1000     Barbiturate      200     Benzodiazepine   200     Tricyclics       300     Opiates          300     Cocaine          300     THC              50  URINE MICROSCOPIC-ADD ON     Status: Abnormal   Collection Time    12/29/12  3:00 PM      Result Value Range   Squamous Epithelial / LPF FEW (*) RARE   WBC, UA 0-2  <3 WBC/hpf   RBC / HPF 3-6  <3 RBC/hpf   Bacteria, UA FEW (*) RARE   Casts HYALINE CASTS (*) NEGATIVE   Comment: GRANULAR CAST   Urine-Other AMORPHOUS URATES/PHOSPHATES    POCT I-STAT 3, BLOOD GAS (G3+)     Status: Abnormal   Collection Time    12/29/12  3:36 PM      Result Value Range   pH, Arterial 7.106 (*) 7.350 - 7.450   pCO2 arterial 72.2 (*) 35.0 - 45.0 mmHg   pO2, Arterial 122.0 (*) 80.0 - 100.0 mmHg   Bicarbonate 22.7  20.0 - 24.0 mEq/L   TCO2 25  0 - 100 mmol/L   O2 Saturation 97.0     Acid-base deficit 9.0 (*) 0.0 - 2.0 mmol/L   Patient temperature 98.6 F     Collection site RADIAL, ALLEN'S TEST ACCEPTABLE  Drawn by Operator     Sample type ARTERIAL     Comment MD NOTIFIED, REPEAT TEST    POCT I-STAT 3, BLOOD GAS (G3+)     Status:  Abnormal   Collection Time    12/29/12  5:58 PM      Result Value Range   pH, Arterial 7.225 (*) 7.350 - 7.450   pCO2 arterial 35.7  35.0 - 45.0 mmHg   pO2, Arterial 77.0 (*) 80.0 - 100.0 mmHg   Bicarbonate 14.8 (*) 20.0 - 24.0 mEq/L   TCO2 16  0 - 100 mmol/L   O2 Saturation 93.0     Acid-base deficit 12.0 (*) 0.0 - 2.0 mmol/L   Patient temperature 98.6 F     Collection site RADIAL, ALLEN'S TEST ACCEPTABLE     Drawn by Operator     Sample type ARTERIAL      Ct Head Wo Contrast  12/29/2012   *RADIOLOGY REPORT*  Clinical Data: Altered mental status.  Possible overdose.  CT HEAD WITHOUT CONTRAST  Technique:  Contiguous axial images were obtained from the base of the skull through the vertex without contrast.  Comparison: Head CT 12/10/2007.  Findings: There is no evidence of acute intracranial hemorrhage, mass lesion, brain edema or extra-axial fluid collection.  The ventricles and subarachnoid spaces are appropriately sized for age. There is no CT evidence of acute cortical infarction.  There is diffuse mucosal thickening throughout the nasal passages, ethmoid and maxillary sinuses.  The nasopharyngeal airway is closed at the time of imaging (the patient is intubated).  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  The mastoids and middle ears are clear. The calvarium is intact.  IMPRESSION:  1.  No acute intracranial or calvarial findings identified. 2.  Diffuse mucosal thickening throughout the visualized nasal passages and paranasal sinuses. Significant maxillary periodontal disease bilaterally.   Original Report Authenticated By: Carey Bullocks, M.D.   Dg Chest Portable 1 View  12/29/2012   *RADIOLOGY REPORT*  Clinical Data: Unresponsive  PORTABLE CHEST - 1 VIEW  Comparison: Chest radiograph 12/29/2012  Findings: Endotracheal tube is 3.5 cm from carina.  Left central venous line is unchanged.  Stable cardiac silhouette.  There is central venous congestion similar prior.   There is bibasilar atelectasis greater on the right not changed.  IMPRESSION:  1.  Stable support apparatus. 2.  No interval change.  3.  Low lung volumes, basilar atelectasis, and central venous congestion.   Original Report Authenticated By: Genevive Bi, M.D.   Dg Chest Port 1 View  12/29/2012   *RADIOLOGY REPORT*  Clinical Data: Evaluate endotracheal tube and left subclavian line placement  PORTABLE CHEST - 1 VIEW  Comparison: Earlier same day  Findings:  Grossly unchanged enlarged cardiac silhouette and mediastinal contours, possibly attributable to decreased lung volumes and AP projection.  An endotracheal tube overlies the tracheal air column with tip superior to the carina.  Interval placement of a left subclavian vein approach central venous catheter with tip projecting over the superior cavoatrial junction.  Enteric tube tip and side port project below the left hemidiaphragm.  No pneumothorax.  Pulmonary vasculature is indistinct with cephalization of flow.  Small/trace bilateral effusions are suspected with minimal amount of fluid layering within the right minor fissure.  Worsening perihilar opacities.  Unchanged bones.  IMPRESSION: 1.  Appropriately positioned support apparatus as above.  No pneumothorax. 2.  Suspected pulmonary edema. 3.  Reduced lung volumes with worsening perihilar opacities, likely atelectasis.  findings suggestive of  pulmonary edema   Original Report Authenticated By: Tacey Ruiz, MD   Dg Chest Portable 1 View  12/29/2012   *RADIOLOGY REPORT*  Clinical Data: Status post intubation  PORTABLE CHEST - 1 VIEW  Comparison: None.  Findings: An endotracheal tube and nasogastric catheter are noted. Nasogastric catheter appears coiled within the distal esophagus. The endotracheal tube is in satisfactory position.  The lungs are well-aerated with some mild right basilar atelectasis.   IMPRESSION: Tubes and lines as described above.  Mild right basilar atelectasis.   Original Report  Authenticated By: Alcide Clever, M.D.    Assessment/Plan 1 shock-etiology of the patient's shock is unclear to me. Based on the temporal relationship to his recent procedure I am most concerned about pulmonary embolus. However I performed a quick look bedside echocardiogram to assess his right heart and on limited images did not fine right ventricular enlargement. His left ventricle was not well visualized but there appeared to be distal inferior akinesis and apical akinesis. The patient was too unstable to send for CT scan. We also felt he was not a good candidate for thrombolytic therapy if indeed he had a pulmonary embolus given his recent lower extremity surgery. He was also noted to have blood from his NG tube. Given his wall motion abnormalities on bedside echocardiogram I was also concerned about the possibility of a cardiac event. Enzymes were pending at the time of this evaluation. His electrocardiogram did not appear to show ST elevation. I discussed the case at length with Dr. Excell Seltzer and Dr Molli Knock. Given the uncertainty in his diagnosis we feel that cardiac catheterization is indicated. This would help to exclude an acute coronary event. We will also place a PA catheter. If a coronary event is excluded then he won't be treated with heparin for a pulmonary embolus as well as pressor support and ventilatory support. I discussed cardiac catheterization with his family. I explained the risks of myocardial infarction, stroke and death and they agree to proceed. I also explained that the patient was critically ill and may not survive this event. Also note orthopedic surgery removed the patient's cast from his lower extremity to evaluate his incision to make sure infection/sepsis was not contributing to his shock. They felt his incision was healing well. His white blood cell count is noted to be mildly elevated but this most likely represents demargination from the stress of his ongoing illness. Note also  there is concern that the patient aspirated. He will be treated with antibiotics by critical care medicine. 2 acute renal failure-follow renal function closely. 3 ventilator dependent respiratory failure-management per cortical care medicine. 4 elevated liver functions-most likely from shock.  60 minutes critical care time 6:30 PM to 7:30 PM. Olga Millers MD 12/29/2012, 7:34 PM

## 2012-12-29 NOTE — CV Procedure (Addendum)
Cardiac Catheterization Procedure Note  Name: Nicholas Potts MRN: [id number] DOB: 1960/12/25  Procedure: Right Heart Cath, Left Heart Cath, Selective Coronary Angiography, LV angiography, placement of Impella hemodynamic support device  Indication: Cardiogenic shock, cardiac arrest. 52 year old gentleman presented with respiratory arrest initially, then full cardiac arrest. He had episodes of ventricular tachycardia all witnessed in the emergency room and was defibrillated appropriately. He had CPR with return of spontaneous circulation. The patient required multiple vasopressor agents. Bedside echocardiogram demonstrated multiple left ventricular wall motion abnormalities. Considering his young age, we brought him for salvage cardiac catheterization and possible PCI. Emergency implied consent was obtained. I did discuss the situation with the family before the procedure and advise them of our plans for cardiac catheterization, possible PCI, and possible placement of a hemodynamic support device considering his severe shock.   Procedural Details: The left groin was prepped, draped, and anesthetized with 1% lidocaine. Using the modified Seldinger technique a 6 French sheath was placed in the left femoral artery and a 7 French sheath was placed in the left femoral vein. A Swan-Ganz catheter was used for the right heart catheterization. Standard protocol was followed for recording of right heart pressures and sampling of oxygen saturations. Fick cardiac output was calculated. Thermal dilution cardiac output was also performed. Standard Judkins catheters were used for selective coronary angiography and left ventriculography. Following diagnostic cardiac catheterization, I elected to implant an Impella hemodynamic support device. I brought the patient's daughter back to the cardiac cath lab to discuss with her. The patient is a Teacher, English as a foreign language. She understood the increased risk of bleeding with a large  sheath required for Impella. However, the patient is critically ill on 3 vasopressor drugs in severe shock, and I felt this would give him his best chance of survival and recovery. She understood and agreed with the plan. Using an Amplatz superstiff wire, the left femoral arterial sheath was upsized to a 14 [place]. The Impella catheter was prepped using normal technique. Using a pigtail catheter and changing out over a guidewire, the device was placed in the left ventricle without difficulty. There was a good cardiac output obtained of 3.6 L per minute. The device was repositioned in order to optimize this. The sheaths were sewn in place. There were no immediate procedural complications. The patient was transferred to the cardiac ICU for further monitoring and care.  Procedural Findings: Hemodynamics RA 25 RV 44/27 PA 44/33 with a mean of 38 PCWP 30 LV 101/33 AO 101/80 with a mean of 90  Oxygen saturations: PA 49 AO 90  Cardiac Output (Fick) 3.5  Cardiac Index (Fick) 1.46  Coronary angiography: Coronary dominance: right  Left mainstem: The left main is patent without significant disease.  Left anterior descending (LAD): The LAD is patent without significant disease. There are diffuse irregularities noted. There is slow flow throughout the entire coronary tree. Distal vessel disease is noted and vasospasm is suspected.  Left circumflex (LCx): The left circumflex is patent. The obtuse marginal branches are patent without significant disease.  Right coronary artery (RCA): The RCA is patent. There are diffuse irregularities. The PDA and PL branches are patent. There is slow distal flow similar to the appearance of the left coronary artery tree.  Left ventriculography: The base of the heart is hyperdynamic. The entire anterior wall, apical wall, and midinferior wall are akinetic. The estimated left ventricular ejection fraction is 20%  Final Conclusions:   1. Severe cardiogenic shock with  severe left ventricular systolic dysfunction  2. Mild nonobstructive coronary artery disease with slow coronary flow  Recommendations: Continued support in the cardiac ICU. Appreciate the management of the CCM team. A stat echocardiogram will be done to confirm impella position. The critical nature of this patient's condition was conveyed to his family and they understand that we'll take some time now to see if he will recover.  Tonny Bollman 12/29/2012, 10:21 PM

## 2012-12-29 NOTE — Progress Notes (Addendum)
ANTIBIOTIC CONSULT NOTE - INITIAL  Pharmacy Consult for vancomycin Indication: rule out pneumonia  No Known Allergies  Patient Measurements: Height: 6' (182.9 cm) IBW/kg (Calculated) : 77.6 States height/weight per family is 67in/268lbs  Vital Signs:   Intake/Output from previous day:   Intake/Output from this shift:    Labs:  Recent Labs  12/29/12 1430  WBC 17.8*  HGB 16.3  PLT 180   CrCl is unknown because no creatinine reading has been taken. No results found for this basename: VANCOTROUGH, VANCOPEAK, VANCORANDOM, GENTTROUGH, GENTPEAK, GENTRANDOM, TOBRATROUGH, TOBRAPEAK, TOBRARND, AMIKACINPEAK, AMIKACINTROU, AMIKACIN,  in the last 72 hours   Microbiology: No results found for this or any previous visit (from the past 720 hour(s)).  Medical History: No past medical history on file.  Medications:  Anti-infectives   Start     Dose/Rate Route Frequency Ordered Stop   12/29/12 1545  vancomycin (VANCOCIN) 2,000 mg in sodium chloride 0.9 % 500 mL IVPB     2,000 mg 250 mL/hr over 120 Minutes Intravenous To Emergency Dept 12/29/12 1528 12/30/12 1545   12/29/12 1530  piperacillin-tazobactam (ZOSYN) IVPB 3.375 g     3.375 g 100 mL/hr over 30 Minutes Intravenous  Once 12/29/12 1519     12/29/12 1515  piperacillin-tazobactam (ZOSYN) IVPB 4.5 g  Status:  Discontinued     4.5 g 200 mL/hr over 30 Minutes Intravenous  Once 12/29/12 1507 12/29/12 1517     Assessment: 51 yom presented to the ED with possible drug overdose. To start empiric antibiotics for possible pneumonia. No vitals available yet but WBC is elevated at 17.8. No Scr yet available and PMH is unknown at this point. MD ordered 1 x dose of zosyn.  Vanc 7/24>> Zosyn 7/24 x 1  Goal of Therapy:  Vancomycin trough level 15-20 mcg/ml  Plan:  1. Vancomycin 2gm IV x 1 2. F/u renal fxn for additional standing doses 3. F/u continuation of zosyn or other antibiotic  Jackye Dever, Drake Leach 12/29/2012,3:28  PM  Addendum: Scr 1.76  Plan: 1. After loading dose, start vancomycin 1250mg  IV Q12H 2. F/u renal fxn, C&S, clinical status and trough at [facility], PharmD, BCPS Pager # [phone] 12/29/2012 4:26 PM

## 2012-12-29 NOTE — H&P (Signed)
PULMONARY  / CRITICAL CARE MEDICINE  Name: Nicholas Potts MRN: [id number] DOB: [birth year]    ADMISSION DATE:  12/29/2012  REFERRING MD :  EDP- Dr. Dan Humphreys PRIMARY SERVICE: PCCM  CHIEF COMPLAINT:  Acute Overdose  BRIEF PATIENT DESCRIPTION: [name] who presented to [facility] ER on 7/24 with AMS related to oxycodone overdose.  Underwent R ankle surgery 7/20 with RLE cast in place on admit.   SIGNIFICANT EVENTS / STUDIES:  7/20 - R ankle surgery (? Type, no [facility] Records) ........................................................................................................ 7/24 - Admit to [facility] with ER with acute oxy IR OD  LINES / TUBES: L Alexander TLC 7/24>>> OETT 7/24>>> Aline 7/24>>>  CULTURES: Sputum 7/24>>> BCx2 7/24>>> UC 7/24>>>  ANTIBIOTICS: Vanco 7/24>>>> Zosyn 7/2/4>>>  HISTORY OF PRESENT ILLNESS:  [name], with unclear medical history,  who presented to [facility] ER on 7/24 with AMS related to oxycodone overdose - Rx filled on 7/23 and 17 tablets missing from bottle on admit.  Found to have profound lactic acidosis, acute respiratory failure, acute renal failure. Underwent R ankle surgery 7/20 with RLE cast in place on admit.   No other medical information available at time of admit.  Noted previous knee surgery in 2009  PAST MEDICAL HISTORY :   Medical history unclear and no family available at this time.  Patient is altered and unable to answer questions.  No past medical history on file. No past surgical history on file.  Prior to Admission medications   Not on File   No Known Allergies  FAMILY HISTORY:  No family history on file. SOCIAL HISTORY:  has no tobacco, alcohol, and drug history on file.  REVIEW OF SYSTEMS:  Unable to complete as patient is altered.   SUBJECTIVE: RN reports hypotension, need for advanced IV access  VITAL SIGNS: FiO2 (%):  [60 %] 60 % (07/24 1500)  HEMODYNAMICS:    VENTILATOR SETTINGS: Vent Mode:  [-] PRVC FiO2 (%):  [60 %] 60 % Set  Rate:  [12 bmp-22 bmp] 22 bmp Vt Set:  [600 mL] 600 mL PEEP:  [5 cmH20] 5 cmH20 Plateau Pressure:  [20 cmH20] 20 cmH20  INTAKE / OUTPUT: Intake/Output   None     PHYSICAL EXAMINATION: General:  Chronically ill in NAD on vent Neuro:  Altered, sedate on vent, pupils 3mm  HEENT:  OETT, mm pink/moist, short thick neck Cardiovascular:  s1s2 distant, regular Lungs:  resp's even/non-labored, lungs bilaterally coarse Abdomen:  Round/soft, bsx4 active Musculoskeletal:  No acute deformities, RLE with partial hard cast, good cap refill in LE Skin:  Cool, dry, no edema  LABS:  Recent Labs Lab 12/29/12 1430 12/29/12 1536  HGB 16.3  --   WBC 17.8*  --   PLT 180  --   NA 137  --   K 6.2*  --   CL 97  --   CO2 21  --   GLUCOSE 163*  --   BUN 19  --   CREATININE 1.76*  --   CALCIUM 8.4  --   AST 703*  --   ALT 791*  --   ALKPHOS 121*  --   BILITOT 0.6  --   PROT 7.1  --   ALBUMIN 3.2*  --   LATICACIDVEN 9.4*  --   PHART  --  7.106*  PCO2ART  --  72.2*  PO2ART  --  122.0*   No results found for this basename: GLUCAP,  in the last 168 hours  CXR: 7/24  -  low lung volumes, concern for aspiration  ASSESSMENT / PLAN:  PULMONARY A: Acute Respiratory Failure - in setting of OD +/- PE Hypoxemia  Rule out DVT Rule out Aspiration PNA P:   -full vent support -abg in one hour post intubation -CTA to rule out PE given hypoxemia -sedation protocol for goal RASS of 0 to -1 -wean PEEP/FiO2 for sats > 92% -rule out head event, then empiric heparin until CT can be obtained   CARDIOVASCULAR A:  Shock - in setting of acute oxycodone overdose.  Likely Metabolic Syndrome  P:  -levophed to support MAP > 65 -rule out MI -assess EKG -assess ECHO  RENAL A:   Acute Kidney Injury - secondary to overdose, ? Prolonged down time Hyperkalemia Anion Gap Acidosis - lactic  P:   -hydration -follow UOP / serum creatinine -bicarb gtt  GASTROINTESTINAL A:   Morbid  Obesity Increased LFT's P:   -protonix -NPO -OGT -follow LFT's  HEMATOLOGIC A:   Macrocytic Anemia - ??ETOH abuse  P:  -folate / MVI / thiamine  INFECTIOUS A:   Rule out Aspiration PNA P:   -pan culture -vanco / zosyn for aspiration - recent admit for R ankle.  Uncertain of length of stay.  If clarified, can narrow for aspiration coverage  ENDOCRINE A:   Concern for Metabolic Syndrome    P:   -SSI -check A1C  NEUROLOGIC A:   Acute Encephalopathy - in setting of overdose, known oxy IR +/- other substances ? ETOH Abuse P:   -CT head to r/o acute event, if negative empiric heparin until CTA chest obtained -supportive care -sedation protocol, if arouses, montior closely for ETOH withdrawal  I have personally obtained a history, examined the patient, evaluated laboratory and imaging results, formulated the assessment and plan and placed orders. CRITICAL CARE:  The patient is critically ill with multiple organ systems failure and requires high complexity decision making for assessment and support, frequent evaluation and titration of therapies, application of advanced monitoring technologies and extensive interpretation of multiple databases. Critical Care Time devoted to patient care services described in this note is 120 minutes.   Alyson Reedy, M.D. [facility] Pulmonary/Critical Care Medicine. Pager: [phone]. After hours pager: [phone].  12/29/2012, 4:47 PM

## 2012-12-29 NOTE — Procedures (Signed)
Central Venous Catheter Insertion Procedure Note MAXTYN NUZUM [id number] 03-14-1961  Procedure: Insertion of Central Venous Catheter Indications: Drug and/or fluid administration  Procedure Details Consent: Unable to obtain consent because of emergent medical necessity. Time Out: Verified patient identification, verified procedure, site/side was marked, verified correct patient position, special equipment/implants available, medications/allergies/relevent history reviewed, required imaging and test results available.  Performed  Maximum sterile technique was used including antiseptics, cap, gloves, gown, hand hygiene, mask and sheet. Skin prep: Chlorhexidine; local anesthetic administered A antimicrobial bonded/coated triple lumen catheter was placed in the left subclavian vein using the Seldinger technique.  Evaluation Blood flow good Complications: No apparent complications Patient did tolerate procedure well. Chest X-ray ordered to verify placement.  CXR: pending.  U/S used in placement.  YACOUB,WESAM 12/29/2012, 5:27 PM

## 2012-12-29 NOTE — Procedures (Signed)
CPR Note  Called with patient in cardiac arrest, patient developed PEA arrest, ACLS protocol followed.  ROSC with pressors.  Stabilized.  No shock required, glucose/insulin/calcium and bicarb given.  Alyson Reedy, M.D. [facility] Pulmonary/Critical Care Medicine. Pager: [phone]. After hours pager: [phone].

## 2012-12-29 NOTE — Interval H&P Note (Signed)
History and Physical Interval Note:  12/29/2012 8:00 PM  Nicholas Potts  has presented today for surgery, with the diagnosis of stemi  The various methods of treatment have been discussed with the patient and family. After consideration of risks, benefits and other options for treatment, the patient has consented to  Procedure(s): LEFT HEART CATHETERIZATION WITH CORONARY ANGIOGRAM (Bilateral) as a surgical intervention .  The patient's history has been reviewed, patient examined, no change in status, stable for surgery.  I have reviewed the patient's chart and labs.  Questions were answered to the patient's satisfaction.    Pt critically ill s/p cardiac arrest. He had prolonged CPR but now with ROSC on multiple pressor agents. I have reviewed his case in detail with Dr Jens Som and the CCM team. Plan right and left heart cath, pulmonary angiogram, possible hemodynamic support. I have spoken with the family and they understand how critical his condition is.   Tonny Bollman

## 2012-12-29 NOTE — Consult Note (Addendum)
HPI: 52 year old male for evaluation of shock. Note at the time of this evaluation the patient was intubated and had been coded 3 separate times. His history is obtained through discussions with critical care medicine and with his family. The patient had repair of his Achilles tendon 2 days prior to admission. He apparently had some difficulties with respiration postoperatively but otherwise did well. He has been taking pain medication as instructed based on his family's report. He was last seen lucid at 9 AM today. At approximately 11:30 AM his family found him unresponsive and "white foam coming from his mouth". He apparently was not complaining of dyspnea or chest pain since his surgery. EMS was called and he was brought to the emergency room where he suffered cardiac arrest 3 separate times. There is note of PEA arrest. I was called to perform an echocardiogram to assess his right heart with concern for pulmonary embolus. Upon my arrival the patient is in shock on 2 pressors and intubated and unresponsive.   (Not in a hospital admission)  No Known Allergies   Past Medical History  Diagnosis Date  . Hyperlipidemia   . OSA (obstructive sleep apnea)     Past Surgical History  Procedure Laterality Date  . Achilles tendon repair      History   Social History  . Marital Status: Married    Spouse Name: N/A    Number of Children: N/A  . Years of Education: N/A   Occupational History  . Not on file.   Social History Main Topics  . Smoking status: Not on file  . Smokeless tobacco: Not on file  . Alcohol Use: Yes     Comment: History of heavy use but only social at present  . Drug Use: Yes     Comment: Prior cocaine use but none since 2000  . Sexually Active: Not on file   Other Topics Concern  . Not on file   Social History Narrative  . No narrative on file    No family history on file.  ROS:  Unobtainable as patient is intubated.  Physical Exam:   Height 6' (1.829 m),  weight 268 lb (121.564 kg).  General:  Well developed/intubated Skin warm/dry HEENT-normal/normal eyelids Neck carotid upstroke bilaterally; no bruits chest - CTA anteriorly CV - regular rhythm and tachycardic/normal S1 and S2; no murmurs, rubs or gallops;  PMI nondisplaced Abdomen -NT/ND, no HSM, no mass, + bowel sounds, no bruit Femoral pulses difficult to palpate. Extremities-right foot in cast. Trace edema on the left. Neuro-not obtainable as patient is intubated.  ECG sinus rhythm with S1 Q 3.  Results for orders placed during the hospital encounter of 12/29/12 (from the past 48 hour(s))  CBC WITH DIFFERENTIAL     Status: Abnormal   Collection Time    12/29/12  2:30 PM      Result Value Range   WBC 17.8 (*) 4.0 - 10.5 K/uL   RBC 4.90  4.22 - 5.81 MIL/uL   Hemoglobin 16.3  13.0 - 17.0 g/dL   HCT 16.1 (*) 09.6 - 04.5 %   MCV 106.5 (*) 78.0 - 100.0 fL   MCH 33.3  26.0 - 34.0 pg   MCHC 31.2  30.0 - 36.0 g/dL   RDW 40.9  81.1 - 91.4 %   Platelets 180  150 - 400 K/uL   Neutrophils Relative % 79 (*) 43 - 77 %   Lymphocytes Relative 15  12 - 46 %   Monocytes  Relative 5  3 - 12 %   Eosinophils Relative 0  0 - 5 %   Basophils Relative 1  0 - 1 %   Neutro Abs 14.0 (*) 1.7 - 7.7 K/uL   Lymphs Abs 2.7  0.7 - 4.0 K/uL   Monocytes Absolute 0.9  0.1 - 1.0 K/uL   Eosinophils Absolute 0.0  0.0 - 0.7 K/uL   Basophils Absolute 0.2 (*) 0.0 - 0.1 K/uL   WBC Morphology MILD LEFT SHIFT (1-5% METAS, OCC MYELO, OCC BANDS)    COMPREHENSIVE METABOLIC PANEL     Status: Abnormal   Collection Time    12/29/12  2:30 PM      Result Value Range   Sodium 137  135 - 145 mEq/L   Potassium 6.2 (*) 3.5 - 5.1 mEq/L   Comment: HEMOLYSIS AT THIS LEVEL MAY AFFECT RESULT   Chloride 97  96 - 112 mEq/L   CO2 21  19 - 32 mEq/L   Glucose, Bld 163 (*) 70 - 99 mg/dL   BUN 19  6 - 23 mg/dL   Creatinine, Ser 1.19 (*) 0.50 - 1.35 mg/dL   Calcium 8.4  8.4 - 14.7 mg/dL   Total Protein 7.1  6.0 - 8.3 g/dL    Albumin 3.2 (*) 3.5 - 5.2 g/dL   AST 829 (*) 0 - 37 U/L   ALT 791 (*) 0 - 53 U/L   Alkaline Phosphatase 121 (*) 39 - 117 U/L   Total Bilirubin 0.6  0.3 - 1.2 mg/dL   GFR calc non Af Amer 42 (*) >90 mL/min   GFR calc Af Amer 49 (*) >90 mL/min   Comment:            The eGFR has been calculated     using the CKD EPI equation.     This calculation has not been     validated in all clinical     situations.     eGFR's persistently     <90 mL/min signify     possible Chronic Kidney Disease.  LACTIC ACID, PLASMA     Status: Abnormal   Collection Time    12/29/12  2:30 PM      Result Value Range   Lactic Acid, Venous 9.4 (*) 0.5 - 2.2 mmol/L  ETHANOL     Status: None   Collection Time    12/29/12  2:30 PM      Result Value Range   Alcohol, Ethyl (B) <11  0 - 11 mg/dL   Comment:            LOWEST DETECTABLE LIMIT FOR     SERUM ALCOHOL IS 11 mg/dL     FOR MEDICAL PURPOSES ONLY  ACETAMINOPHEN LEVEL     Status: None   Collection Time    12/29/12  2:30 PM      Result Value Range   Acetaminophen (Tylenol), Serum <15.0  10 - 30 ug/mL   Comment:            THERAPEUTIC CONCENTRATIONS VARY     SIGNIFICANTLY. A RANGE OF 10-30     ug/mL MAY BE AN EFFECTIVE     CONCENTRATION FOR MANY PATIENTS.     HOWEVER, SOME ARE BEST TREATED     AT CONCENTRATIONS OUTSIDE THIS     RANGE.     ACETAMINOPHEN CONCENTRATIONS     >150 ug/mL AT 4 HOURS AFTER     INGESTION AND >50 ug/mL AT 12  HOURS AFTER INGESTION ARE     OFTEN ASSOCIATED WITH TOXIC     REACTIONS.  URINALYSIS, ROUTINE W REFLEX MICROSCOPIC     Status: Abnormal   Collection Time    12/29/12  3:00 PM      Result Value Range   Color, Urine AMBER (*) YELLOW   Comment: BIOCHEMICALS MAY BE AFFECTED BY COLOR   APPearance TURBID (*) CLEAR   Specific Gravity, Urine 1.023  1.005 - 1.030   pH 6.0  5.0 - 8.0   Glucose, UA 100 (*) NEGATIVE mg/dL   Hgb urine dipstick LARGE (*) NEGATIVE   Bilirubin Urine NEGATIVE  NEGATIVE   Ketones, ur NEGATIVE   NEGATIVE mg/dL   Protein, ur >213 (*) NEGATIVE mg/dL   Urobilinogen, UA 1.0  0.0 - 1.0 mg/dL   Nitrite NEGATIVE  NEGATIVE   Leukocytes, UA NEGATIVE  NEGATIVE  URINE RAPID DRUG SCREEN (HOSP PERFORMED)     Status: None   Collection Time    12/29/12  3:00 PM      Result Value Range   Opiates NONE DETECTED  NONE DETECTED   Cocaine NONE DETECTED  NONE DETECTED   Benzodiazepines NONE DETECTED  NONE DETECTED   Amphetamines NONE DETECTED  NONE DETECTED   Tetrahydrocannabinol NONE DETECTED  NONE DETECTED   Barbiturates NONE DETECTED  NONE DETECTED   Comment:            DRUG SCREEN FOR MEDICAL PURPOSES     ONLY.  IF CONFIRMATION IS NEEDED     FOR ANY PURPOSE, NOTIFY LAB     WITHIN 5 DAYS.                LOWEST DETECTABLE LIMITS     FOR URINE DRUG SCREEN     Drug Class       Cutoff (ng/mL)     Amphetamine      1000     Barbiturate      200     Benzodiazepine   200     Tricyclics       300     Opiates          300     Cocaine          300     THC              50  URINE MICROSCOPIC-ADD ON     Status: Abnormal   Collection Time    12/29/12  3:00 PM      Result Value Range   Squamous Epithelial / LPF FEW (*) RARE   WBC, UA 0-2  <3 WBC/hpf   RBC / HPF 3-6  <3 RBC/hpf   Bacteria, UA FEW (*) RARE   Casts HYALINE CASTS (*) NEGATIVE   Comment: GRANULAR CAST   Urine-Other AMORPHOUS URATES/PHOSPHATES    POCT I-STAT 3, BLOOD GAS (G3+)     Status: Abnormal   Collection Time    12/29/12  3:36 PM      Result Value Range   pH, Arterial 7.106 (*) 7.350 - 7.450   pCO2 arterial 72.2 (*) 35.0 - 45.0 mmHg   pO2, Arterial 122.0 (*) 80.0 - 100.0 mmHg   Bicarbonate 22.7  20.0 - 24.0 mEq/L   TCO2 25  0 - 100 mmol/L   O2 Saturation 97.0     Acid-base deficit 9.0 (*) 0.0 - 2.0 mmol/L   Patient temperature 98.6 F     Collection site RADIAL, ALLEN'S TEST ACCEPTABLE  Drawn by Operator     Sample type ARTERIAL     Comment MD NOTIFIED, REPEAT TEST    POCT I-STAT 3, BLOOD GAS (G3+)     Status:  Abnormal   Collection Time    12/29/12  5:58 PM      Result Value Range   pH, Arterial 7.225 (*) 7.350 - 7.450   pCO2 arterial 35.7  35.0 - 45.0 mmHg   pO2, Arterial 77.0 (*) 80.0 - 100.0 mmHg   Bicarbonate 14.8 (*) 20.0 - 24.0 mEq/L   TCO2 16  0 - 100 mmol/L   O2 Saturation 93.0     Acid-base deficit 12.0 (*) 0.0 - 2.0 mmol/L   Patient temperature 98.6 F     Collection site RADIAL, ALLEN'S TEST ACCEPTABLE     Drawn by Operator     Sample type ARTERIAL      Ct Head Wo Contrast  12/29/2012   *RADIOLOGY REPORT*  Clinical Data: Altered mental status.  Possible overdose.  CT HEAD WITHOUT CONTRAST  Technique:  Contiguous axial images were obtained from the base of the skull through the vertex without contrast.  Comparison: Head CT 12/10/2007.  Findings: There is no evidence of acute intracranial hemorrhage, mass lesion, brain edema or extra-axial fluid collection.  The ventricles and subarachnoid spaces are appropriately sized for age. There is no CT evidence of acute cortical infarction.  There is diffuse mucosal thickening throughout the nasal passages, ethmoid and maxillary sinuses.  The nasopharyngeal airway is closed at the time of imaging (the patient is intubated).  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  The mastoids and middle ears are clear. The calvarium is intact.  IMPRESSION:  1.  No acute intracranial or calvarial findings identified. 2.  Diffuse mucosal thickening throughout the visualized nasal passages and paranasal sinuses. Significant maxillary periodontal disease bilaterally.   Original Report Authenticated By: Carey Bullocks, M.D.   Dg Chest Portable 1 View  12/29/2012   *RADIOLOGY REPORT*  Clinical Data: Unresponsive  PORTABLE CHEST - 1 VIEW  Comparison: Chest radiograph 12/29/2012  Findings: Endotracheal tube is 3.5 cm from carina.  Left central venous line is unchanged.  Stable cardiac silhouette.  There is central venous congestion similar prior.   There is bibasilar atelectasis greater on the right not changed.  IMPRESSION:  1.  Stable support apparatus. 2.  No interval change.  3.  Low lung volumes, basilar atelectasis, and central venous congestion.   Original Report Authenticated By: Genevive Bi, M.D.   Dg Chest Port 1 View  12/29/2012   *RADIOLOGY REPORT*  Clinical Data: Evaluate endotracheal tube and left subclavian line placement  PORTABLE CHEST - 1 VIEW  Comparison: Earlier same day  Findings:  Grossly unchanged enlarged cardiac silhouette and mediastinal contours, possibly attributable to decreased lung volumes and AP projection.  An endotracheal tube overlies the tracheal air column with tip superior to the carina.  Interval placement of a left subclavian vein approach central venous catheter with tip projecting over the superior cavoatrial junction.  Enteric tube tip and side port project below the left hemidiaphragm.  No pneumothorax.  Pulmonary vasculature is indistinct with cephalization of flow.  Small/trace bilateral effusions are suspected with minimal amount of fluid layering within the right minor fissure.  Worsening perihilar opacities.  Unchanged bones.  IMPRESSION: 1.  Appropriately positioned support apparatus as above.  No pneumothorax. 2.  Suspected pulmonary edema. 3.  Reduced lung volumes with worsening perihilar opacities, likely atelectasis.  findings suggestive of  pulmonary edema   Original Report Authenticated By: Tacey Ruiz, MD   Dg Chest Portable 1 View  12/29/2012   *RADIOLOGY REPORT*  Clinical Data: Status post intubation  PORTABLE CHEST - 1 VIEW  Comparison: None.  Findings: An endotracheal tube and nasogastric catheter are noted. Nasogastric catheter appears coiled within the distal esophagus. The endotracheal tube is in satisfactory position.  The lungs are well-aerated with some mild right basilar atelectasis.   IMPRESSION: Tubes and lines as described above.  Mild right basilar atelectasis.   Original Report  Authenticated By: Alcide Clever, M.D.    Assessment/Plan 1 shock-etiology of the patient's shock is unclear to me. Based on the temporal relationship to his recent procedure I am most concerned about pulmonary embolus. However I performed a quick look bedside echocardiogram to assess his right heart and on limited images did not find right ventricular enlargement. His left ventricle was not well visualized but there appeared to be distal inferior akinesis and apical akinesis. The patient was too unstable to send for CT scan. We also felt he was not a good candidate for thrombolytic therapy if indeed he had a pulmonary embolus given his recent lower extremity surgery. He was also noted to have blood from his NG tube. Given his wall motion abnormalities on bedside echocardiogram I was also concerned about the possibility of a cardiac event. Enzymes were pending at the time of this evaluation. His electrocardiogram did not appear to show ST elevation. I discussed the case at length with Dr. Excell Seltzer and Dr Molli Knock. Given the uncertainty in his diagnosis we feel that cardiac catheterization is indicated. This would help to exclude an acute coronary event. We will also place a PA catheter. If a coronary event is excluded then he will be treated with heparin for a pulmonary embolus as well as pressor support and ventilatory support. I discussed cardiac catheterization with his family. I explained the risks of myocardial infarction, stroke and death and they agree to proceed. I also explained that the patient was critically ill and may not survive this event. Also note orthopedic surgery removed the patient's cast from his lower extremity to evaluate his incision to make sure infection/sepsis was not contributing to his shock. They felt his incision was healing well. His white blood cell count is noted to be mildly elevated but this most likely represents demargination from the stress of his ongoing illness. Note also there  is concern that the patient aspirated. He will be treated with antibiotics by critical care medicine. 2 acute renal failure-follow renal function closely. 3 ventilator dependent respiratory failure-management per cortical care medicine. 4 elevated liver functions-most likely from shock.  60 minutes critical care time 6:30 PM to 7:30 PM. Olga Millers MD 12/29/2012, 7:34 PM

## 2012-12-29 NOTE — Progress Notes (Signed)
Increased rate to 22 BPM due to ABG results.

## 2012-12-29 NOTE — ED Provider Notes (Addendum)
I was asked to assist in difficult intubation in ER pt with shock and hypoxia on arrival to ER (please note that documentation is performed several hours after procedure)  INTUBATION Performed by: Lyanne Co Required items: required blood products, implants, devices, and special equipment available Patient identity confirmed: provided demographic data and hospital-assigned identification number Time out: Immediately prior to procedure a "time out" was called to verify the correct patient, procedure, equipment, support staff and site/side marked as required. Indications: hypoxia, shock Intubation method: Glidescope Laryngoscopy  Preoxygenation: BVM Sedatives: Etomidate Paralytic:Succinylcholine Tube Size: 7-5cuffed Post-procedure assessment: chest rise and ETCO2 monitor Breath sounds: equal and absent over the epigastrium Tube secured with: ETT holder Chest x-ray interpreted by radiologist and me. Chest x-ray findings: endotracheal tube in appropriate position Patient tolerated the procedure well with no immediate complications.     Lyanne Co, MD 12/29/12 2325  Lyanne Co, MD 12/29/12 [phone]

## 2012-12-29 NOTE — Progress Notes (Signed)
Patient intubated by ED physician with 8.0 ET-Tube taped at 26 at lip placed on above settings MD aware.

## 2012-12-29 NOTE — Code Documentation (Signed)
10 units insulin given IV

## 2012-12-29 NOTE — Procedures (Signed)
PATIENT NAME: KEITHEN CAPO MEDICAL RECORD NUMBER: [id number] Birthday: 06/10/1960  Age: 52 y.o. Admit Date: 12/29/2012  Provider: CCM  Indication: PEA arrest  Technical Description:   CPR performance duration: approximately 3 minutes  Was defibrillation or cardioversion used ? no  Was external pacer placed ? Not paced  Was patient intubated pre/post CPR ? pre  Was transvenous pacer placed ? no  Medications Administered Include      Yes/no Amiodarone   Atropin yes  Calcium   Epinephrine yes  Lidocaine   Magnesium   Norepinephrine yes  Phenylephrine yes  Sodium bicarbonate yes  Vasopression yes   Evaluation  Final Status - Was patient successfully resuscitated ? yes  If successfully resuscitated - what is current rhythm ? SR If successfully resuscitated - what is current hemodynamic status ? Unstable   Miscellaneous Information Called back for cardiac arrest in ER.  Initial rhythm VT, CPR initiated.  Patient treated with insulin, D50, bicarb, calcium, dextrose for hyperkalemia.  Multiple rounds of ACLS performed.   Koren Bound 7/24/20146:42 PM

## 2012-12-29 NOTE — Code Documentation (Signed)
Shock delivered 120j 

## 2012-12-29 NOTE — ED Notes (Signed)
Pt unresponsive on EMS arrival. RR 6, BVM assisted ventilation. 4mg  Narcan given, then RR20 unassisted. Pt RR dropped, EMS resumed assisted ventilation. Pt had leg surgery 4d ago. Pt's daughter reported that pt was vomiting last night and had a L sided HA around 9am today. Pt has Rx for oxycodone 5mg  IR with 23 remaining in a bottle of 40 that was filled yesterday. Pupils pinpoint.

## 2012-12-29 NOTE — Progress Notes (Signed)
Echocardiogram 2D Echocardiogram limited has been performed.  Nicholas Potts 12/29/2012, 11:53 PM

## 2012-12-29 NOTE — ED Provider Notes (Signed)
History    CSN: [id number] Arrival date & time 12/29/12  1406  First MD Initiated Contact with Patient 12/29/12 1423     Chief Complaint  Patient presents with  . Drug Overdose   (Consider location/radiation/quality/duration/timing/severity/associated sxs/prior Treatment) HPI Comments: History, review of systems, and physical exam are limited due to patient's status and acuity. Patient with recent left lower extremity surgery 4 days ago, presents via EMS with altered mental status. Per family, his postoperative course has been somewhat complicated. Just after surgery, he had some difficulty waking up after anesthesia. He required overnight hospitalizations. Since then he has continued to use his pain medications as directed; however, he has been somewhat somnolent over the last couple of days. Family noted that he had some episodes of vomiting overnight, appeared diaphoretic this morning and was less arousable. When EMS arrived to the house, he was apneic. Narcan was given without much effect. Attempts at intubation were unsuccessful.  No past medical history on file. No past surgical history on file. No family history on file. History  Substance Use Topics  . Smoking status: Not on file  . Smokeless tobacco: Not on file  . Alcohol Use: Not on file    Review of Systems  Unable to perform ROS: Mental status change  All other systems reviewed and are negative.    Allergies  Review of patient's allergies indicates not on file.  Home Medications  No current outpatient prescriptions on file. There were no vitals taken for this visit. Physical Exam CONSTITUTIONAL  and responsive. No apparent trauma. HEENT normocephalic atraumatic, external ears normal, nose normal, mucus membranes dry. Oropharynx within normal limits EYES  normal conjunctiva, no sclera icterus noted, pupils pinpoint bilaterally NECK  no nuchal rigidity  CARDIOVASCULAR  regular rate on arrival with regular  rhythm. 2+ distal pulses throughout. No significant edema noted of left lower extremity PULMONARY/CHEST WALL  patient with decreased respiratory rate. Breath sounds equal bilaterally. No wheezing, no rales, no rhonchi. ABDOMINAL obese, moderate distention, normal active bowel sounds GU  normal  genital eval MUSCULOSKELETAL  left lower extremity is in a splint. Appears well perfused distally.  NEUROLOGICAL  GCS: E1 V1 M1 = 3; not noted to move extremities. SKIN cool and dry. No rashes PSYCHIATRIC deferred  ED Course  Procedures (including critical care time) Labs Reviewed  BLOOD GAS, ARTERIAL  CBC WITH DIFFERENTIAL  COMPREHENSIVE METABOLIC PANEL  URINALYSIS, ROUTINE W REFLEX MICROSCOPIC  URINE RAPID DRUG SCREEN (HOSP PERFORMED)  LACTIC ACID, PLASMA  ETHANOL  ACETAMINOPHEN LEVEL   No results found. No diagnosis found.  ABG: PH 7.106, PCO2 72.2, PO2 122, bicarbonate 22.7 Results for orders placed during the hospital encounter of 12/29/12  CBC WITH DIFFERENTIAL      Result Value Range   WBC 17.8 (*) 4.0 - 10.5 K/uL   RBC 4.90  4.22 - 5.81 MIL/uL   Hemoglobin 16.3  13.0 - 17.0 g/dL   HCT 78.2 (*) 95.6 - 21.3 %   MCV 106.5 (*) 78.0 - 100.0 fL   MCH 33.3  26.0 - 34.0 pg   MCHC 31.2  30.0 - 36.0 g/dL   RDW 08.6  57.8 - 46.9 %   Platelets 180  150 - 400 K/uL   Neutrophils Relative % 79 (*) 43 - 77 %   Lymphocytes Relative 15  12 - 46 %   Monocytes Relative 5  3 - 12 %   Eosinophils Relative 0  0 - 5 %   Basophils  Relative 1  0 - 1 %   Neutro Abs 14.0 (*) 1.7 - 7.7 K/uL   Lymphs Abs 2.7  0.7 - 4.0 K/uL   Monocytes Absolute 0.9  0.1 - 1.0 K/uL   Eosinophils Absolute 0.0  0.0 - 0.7 K/uL   Basophils Absolute 0.2 (*) 0.0 - 0.1 K/uL   WBC Morphology MILD LEFT SHIFT (1-5% METAS, OCC MYELO, OCC BANDS)    COMPREHENSIVE METABOLIC PANEL      Result Value Range   Sodium 137  135 - 145 mEq/L   Potassium 6.2 (*) 3.5 - 5.1 mEq/L   Chloride 97  96 - 112 mEq/L   CO2 21  19 - 32 mEq/L    Glucose, Bld 163 (*) 70 - 99 mg/dL   BUN 19  6 - 23 mg/dL   Creatinine, Ser 4.54 (*) 0.50 - 1.35 mg/dL   Calcium 8.4  8.4 - 09.8 mg/dL   Total Protein 7.1  6.0 - 8.3 g/dL   Albumin 3.2 (*) 3.5 - 5.2 g/dL   AST 119 (*) 0 - 37 U/L   ALT 791 (*) 0 - 53 U/L   Alkaline Phosphatase 121 (*) 39 - 117 U/L   Total Bilirubin 0.6  0.3 - 1.2 mg/dL   GFR calc non Af Amer 42 (*) >90 mL/min   GFR calc Af Amer 49 (*) >90 mL/min  URINALYSIS, ROUTINE W REFLEX MICROSCOPIC      Result Value Range   Color, Urine AMBER (*) YELLOW   APPearance TURBID (*) CLEAR   Specific Gravity, Urine 1.023  1.005 - 1.030   pH 6.0  5.0 - 8.0   Glucose, UA 100 (*) NEGATIVE mg/dL   Hgb urine dipstick LARGE (*) NEGATIVE   Bilirubin Urine NEGATIVE  NEGATIVE   Ketones, ur NEGATIVE  NEGATIVE mg/dL   Protein, ur >147 (*) NEGATIVE mg/dL   Urobilinogen, UA 1.0  0.0 - 1.0 mg/dL   Nitrite NEGATIVE  NEGATIVE   Leukocytes, UA NEGATIVE  NEGATIVE  URINE RAPID DRUG SCREEN (HOSP PERFORMED)      Result Value Range   Opiates NONE DETECTED  NONE DETECTED   Cocaine NONE DETECTED  NONE DETECTED   Benzodiazepines NONE DETECTED  NONE DETECTED   Amphetamines NONE DETECTED  NONE DETECTED   Tetrahydrocannabinol NONE DETECTED  NONE DETECTED   Barbiturates NONE DETECTED  NONE DETECTED  LACTIC ACID, PLASMA      Result Value Range   Lactic Acid, Venous 9.4 (*) 0.5 - 2.2 mmol/L  ETHANOL      Result Value Range   Alcohol, Ethyl (B) <11  0 - 11 mg/dL  ACETAMINOPHEN LEVEL      Result Value Range   Acetaminophen (Tylenol), Serum <15.0  10 - 30 ug/mL  URINE MICROSCOPIC-ADD ON      Result Value Range   Squamous Epithelial / LPF FEW (*) RARE   WBC, UA 0-2  <3 WBC/hpf   RBC / HPF 3-6  <3 RBC/hpf   Bacteria, UA FEW (*) RARE   Casts HYALINE CASTS (*) NEGATIVE   Urine-Other AMORPHOUS URATES/PHOSPHATES    POCT I-STAT 3, BLOOD GAS (G3+)      Result Value Range   pH, Arterial 7.106 (*) 7.350 - 7.450   pCO2 arterial 72.2 (*) 35.0 - 45.0 mmHg    pO2, Arterial 122.0 (*) 80.0 - 100.0 mmHg   Bicarbonate 22.7  20.0 - 24.0 mEq/L   TCO2 25  0 - 100 mmol/L   O2 Saturation 97.0  Acid-base deficit 9.0 (*) 0.0 - 2.0 mmol/L   Patient temperature 98.6 F     Collection site RADIAL, ALLEN'S TEST ACCEPTABLE     Drawn by Operator     Sample type ARTERIAL     Comment MD NOTIFIED, REPEAT TEST     Ct Head Wo Contrast  12/29/2012   *RADIOLOGY REPORT*  Clinical Data: Altered mental status.  Possible overdose.  CT HEAD WITHOUT CONTRAST  Technique:  Contiguous axial images were obtained from the base of the skull through the vertex without contrast.  Comparison: Head CT 12/10/2007.  Findings: There is no evidence of acute intracranial hemorrhage, mass lesion, brain edema or extra-axial fluid collection.  The ventricles and subarachnoid spaces are appropriately sized for age. There is no CT evidence of acute cortical infarction.  There is diffuse mucosal thickening throughout the nasal passages, ethmoid and maxillary sinuses.  The nasopharyngeal airway is closed at the time of imaging (the patient is intubated).  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  The mastoids and middle ears are clear. The calvarium is intact.  IMPRESSION:  1.  No acute intracranial or calvarial findings identified. 2.  Diffuse mucosal thickening throughout the visualized nasal passages and paranasal sinuses. Significant maxillary periodontal disease bilaterally.   Original Report Authenticated By: Carey Bullocks, M.D.   Dg Chest Port 1 View  12/29/2012   *RADIOLOGY REPORT*  Clinical Data: Evaluate endotracheal tube and left subclavian line placement  PORTABLE CHEST - 1 VIEW  Comparison: Earlier same day  Findings:  Grossly unchanged enlarged cardiac silhouette and mediastinal contours, possibly attributable to decreased lung volumes and AP projection.  An endotracheal tube overlies the tracheal air column with tip superior to the carina.  Interval placement of a  left subclavian vein approach central venous catheter with tip projecting over the superior cavoatrial junction.  Enteric tube tip and side port project below the left hemidiaphragm.  No pneumothorax.  Pulmonary vasculature is indistinct with cephalization of flow.  Small/trace bilateral effusions are suspected with minimal amount of fluid layering within the right minor fissure.  Worsening perihilar opacities.  Unchanged bones.  IMPRESSION: 1.  Appropriately positioned support apparatus as above.  No pneumothorax. 2.  Suspected pulmonary edema. 3.  Reduced lung volumes with worsening perihilar opacities, likely atelectasis.  findings suggestive of pulmonary edema   Original Report Authenticated By: Tacey Ruiz, MD   Dg Chest Portable 1 View  12/29/2012   *RADIOLOGY REPORT*  Clinical Data: Status post intubation  PORTABLE CHEST - 1 VIEW  Comparison: None.  Findings: An endotracheal tube and nasogastric catheter are noted. Nasogastric catheter appears coiled within the distal esophagus. The endotracheal tube is in satisfactory position.  The lungs are well-aerated with some mild right basilar atelectasis.   IMPRESSION: Tubes and lines as described above.  Mild right basilar atelectasis.   Original Report Authenticated By: Alcide Clever, M.D.      Cardiopulmonary Resuscitation (CPR) Procedure Note Directed/Performed by: Ronna Polio, ANN I personally directed ancillary staff and/or performed CPR in an effort to regain return of spontaneous circulation and to maintain cardiac, neuro and systemic perfusion.   CRITICAL CARE Performed by: Ronna Polio, ANN   Total critical care time: 90 minutes  Critical care time was exclusive of separately billable procedures and treating other patients.  Critical care was necessary to treat or prevent imminent or life-threatening deterioration.  Critical care was time spent personally by me on the following activities: development of treatment plan with  patient  and/or surrogate as well as nursing, discussions with consultants, evaluation of patient's response to treatment, examination of patient, obtaining history from patient or surrogate, ordering and performing treatments and interventions, ordering and review of laboratory studies, ordering and review of radiographic studies, pulse oximetry and re-evaluation of patient's condition.  MDM  Patient with no known significant past medical history presents after having surgery on his lower extremity 4 days ago. He was noted to be unresponsive at home this morning and brought in by EMS with apnea.  The patient was intubated upon arrival due to decreased GCS as well as apnea. Initial lactate and leukocytosis as well as hypothermia makes sepsis a concern; therefore, broad-spectrum antibiotics were initiated and cultures were obtained. CT scan was obtained to rule out intracranial pathology and was negative. Initial ABG illustrated respiratory acidosis; therefore, respiratory rate on the ventilator was increased to 22. Shortly after critical care consult, the patient became hypotensive with systolic pressures in the 60s, his heart rate decreased to the 20s and 30s. He then became pulseless, CPR was initiated (VT noted on monitor during CPR and amiodarone and electricity were given) and pulses were regained. Critical care bedside with plan for admission.  The etiology of his altered mental status and shock seem unclear at this point. There is some concern for septic shock and antibiotics have been given. With recent surgery, there is also some concern that he may have developed a pulmonary embolism; however, with the risks of tPA and unclear etiology, will defer this decision to critical care who is now managing the patient. EKG shows no acute STEMI or other arrhythmias to cause hypotension.  His labs are concerning for multi-organ failure.  Family have been updated regarding the patient's status    Ashby Dawes, MD 12/29/12 1800

## 2012-12-29 NOTE — Progress Notes (Signed)
Called ED to get report. RN unable to take call due to patient coding. Left phone number for RN to call.   Alfonso Ellis, RN

## 2012-12-29 NOTE — Progress Notes (Signed)
Support for large family when [employer] called. Patient and family are Jehov[employer]'s Witnesses. Two ministers are present with them, including their [facility] Liaison Minister [place]. Communicated faith information to charge nurse and cath lab staff.

## 2012-12-30 ENCOUNTER — Inpatient Hospital Stay (HOSPITAL_COMMUNITY): Payer: BC Managed Care – PPO

## 2012-12-30 DIAGNOSIS — I517 Cardiomegaly: Secondary | ICD-10-CM

## 2012-12-30 DIAGNOSIS — M7989 Other specified soft tissue disorders: Secondary | ICD-10-CM

## 2012-12-30 LAB — POCT ACTIVATED CLOTTING TIME
Activated Clotting Time: 216 seconds
Activated Clotting Time: 160 seconds
Activated Clotting Time: 160 seconds
Activated Clotting Time: 176 seconds
Activated Clotting Time: 166 seconds

## 2012-12-30 LAB — CBC
MCHC: 32.4 g/dL (ref 30.0–36.0)
MCV: 97.5 fL (ref 78.0–100.0)
Platelets: 130 10*3/uL — ABNORMAL LOW (ref 150–400)
RBC: 4.04 MIL/uL — ABNORMAL LOW (ref 4.22–5.81)
RDW: 13.7 % (ref 11.5–15.5)
RDW: 13.6 % (ref 11.5–15.5)
WBC: 21.5 10*3/uL — ABNORMAL HIGH (ref 4.0–10.5)
WBC: 19.5 10*3/uL — ABNORMAL HIGH (ref 4.0–10.5)

## 2012-12-30 LAB — URINE CULTURE
Colony Count: NO GROWTH
Culture: NO GROWTH

## 2012-12-30 LAB — POCT I-STAT 3, ART BLOOD GAS (G3+)
Acid-base deficit: 5 mmol/L — ABNORMAL HIGH (ref 0.0–2.0)
Acid-base deficit: 7 mmol/L — ABNORMAL HIGH (ref 0.0–2.0)
Acid-base deficit: 9 mmol/L — ABNORMAL HIGH (ref 0.0–2.0)
Bicarbonate: 15.7 mEq/L — ABNORMAL LOW (ref 20.0–24.0)
Bicarbonate: 18.4 mEq/L — ABNORMAL LOW (ref 20.0–24.0)
Bicarbonate: 22.8 mEq/L (ref 20.0–24.0)
Bicarbonate: 31 mEq/L — ABNORMAL HIGH (ref 20.0–24.0)
O2 Saturation: 90 %
O2 Saturation: 96 %
O2 Saturation: 96 %
Patient temperature: 35.5
Patient temperature: 36.2
Patient temperature: 38
TCO2: 17 mmol/L (ref 0–100)
TCO2: 19 mmol/L (ref 0–100)
TCO2: 21 mmol/L (ref 0–100)
TCO2: 24 mmol/L (ref 0–100)
pCO2 arterial: 32.9 mmHg — ABNORMAL LOW (ref 35.0–45.0)
pCO2 arterial: 35.7 mmHg (ref 35.0–45.0)
pH, Arterial: 7.313 — ABNORMAL LOW (ref 7.350–7.450)
pH, Arterial: 7.448 (ref 7.350–7.450)
pH, Arterial: 7.633 (ref 7.350–7.450)
pO2, Arterial: 60 mmHg — ABNORMAL LOW (ref 80.0–100.0)

## 2012-12-30 LAB — VANCOMYCIN, RANDOM: Vancomycin Rm: 22.7 ug/mL

## 2012-12-30 LAB — POCT I-STAT 3, VENOUS BLOOD GAS (G3P V)
Acid-base deficit: 10 mmol/L — ABNORMAL HIGH (ref 0.0–2.0)
Bicarbonate: 17 mEq/L — ABNORMAL LOW (ref 20.0–24.0)
O2 Saturation: 49 %
pO2, Ven: 30 mmHg (ref 30.0–45.0)

## 2012-12-30 LAB — COMPREHENSIVE METABOLIC PANEL
ALT: 4490 U/L — ABNORMAL HIGH (ref 0–53)
Alkaline Phosphatase: 91 U/L (ref 39–117)
CO2: 24 mEq/L (ref 19–32)
GFR calc Af Amer: 32 mL/min — ABNORMAL LOW (ref >90)
GFR calc non Af Amer: 28 mL/min — ABNORMAL LOW (ref >90)
Glucose, Bld: 269 mg/dL — ABNORMAL HIGH (ref 70–99)
Potassium: 5 mEq/L (ref 3.5–5.1)
Sodium: 138 mEq/L (ref 135–145)
Total Protein: 4.9 g/dL — ABNORMAL LOW (ref 6.0–8.3)

## 2012-12-30 LAB — GLUCOSE, CAPILLARY
Glucose-Capillary: 224 mg/dL — ABNORMAL HIGH (ref 70–99)
Glucose-Capillary: 217 mg/dL — ABNORMAL HIGH (ref 70–99)
Glucose-Capillary: 168 mg/dL — ABNORMAL HIGH (ref 70–99)
Glucose-Capillary: 155 mg/dL — ABNORMAL HIGH (ref 70–99)
Glucose-Capillary: 168 mg/dL — ABNORMAL HIGH (ref 70–99)
Glucose-Capillary: 107 mg/dL — ABNORMAL HIGH (ref 70–99)
Glucose-Capillary: 103 mg/dL — ABNORMAL HIGH (ref 70–99)
Glucose-Capillary: 104 mg/dL — ABNORMAL HIGH (ref 70–99)
Glucose-Capillary: 108 mg/dL — ABNORMAL HIGH (ref 70–99)

## 2012-12-30 LAB — BASIC METABOLIC PANEL
CO2: 29 mEq/L (ref 19–32)
Chloride: 100 mEq/L (ref 96–112)
Creatinine, Ser: 3.35 mg/dL — ABNORMAL HIGH (ref 0.50–1.35)
GFR calc Af Amer: 23 mL/min — ABNORMAL LOW (ref >90)
Potassium: 3.6 mEq/L (ref 3.5–5.1)

## 2012-12-30 MED ORDER — SODIUM PHOSPHATE 3 MMOLE/ML IV SOLN: 30.0000 mmol | Freq: Once | INTRAVENOUS | Status: DC

## 2012-12-30 MED ORDER — INSULIN ASPART 100 UNIT/ML ~~LOC~~ SOLN
1.0000 [IU] | SUBCUTANEOUS | Status: DC
  Administered 2012-12-30: 1 [IU] via SUBCUTANEOUS
  Administered 2012-12-31: 2 [IU] via SUBCUTANEOUS
  Administered 2012-12-31: 1 [IU] via SUBCUTANEOUS
  Administered 2012-12-31 (×2): 2 [IU] via SUBCUTANEOUS
  Administered 2012-12-31 – 2013-01-03 (×7): 1 [IU] via SUBCUTANEOUS
  Administered 2013-01-03: 2 [IU] via SUBCUTANEOUS
  Administered 2013-01-05 – 2013-01-08 (×9): 1 [IU] via SUBCUTANEOUS

## 2012-12-30 MED ORDER — DEXTROSE 10 % IV SOLN: INTRAVENOUS | Status: DC | PRN

## 2012-12-30 MED ORDER — BIOTENE DRY MOUTH MT LIQD
15.0000 mL | Freq: Four times a day (QID) | OROMUCOSAL | Status: DC
  Administered 2012-12-30 – 2013-01-10 (×44): 15 mL via OROMUCOSAL

## 2012-12-30 MED ORDER — SODIUM GLYCEROPHOSPHATE 1 MMOLE/ML IV SOLN
10.0000 mmol | Freq: Once | INTRAVENOUS | Status: AC
  Administered 2012-12-30: 10 mmol via INTRAVENOUS
  Filled 2012-12-30: qty 10

## 2012-12-30 MED ORDER — SODIUM CHLORIDE 0.9 % IV SOLN
INTRAVENOUS | Status: DC
  Administered 2012-12-30: 19:00:00 via INTRAVENOUS

## 2012-12-30 MED ORDER — FUROSEMIDE 10 MG/ML IJ SOLN
160.0000 mg | Freq: Three times a day (TID) | INTRAVENOUS | Status: DC
  Administered 2012-12-30 – 2012-12-31 (×4): 160 mg via INTRAVENOUS
  Filled 2012-12-30 (×7): qty 16

## 2012-12-30 MED ORDER — PRO-STAT SUGAR FREE PO LIQD
60.0000 mL | Freq: Every day | ORAL | Status: DC
  Administered 2012-12-30 – 2013-01-08 (×44): 60 mL via ORAL
  Filled 2012-12-30 (×50): qty 60

## 2012-12-30 MED ORDER — HEPARIN (PORCINE) IN NACL 100-0.45 UNIT/ML-% IJ SOLN
500.0000 [IU]/h | INTRAMUSCULAR | Status: DC
  Administered 2012-12-30: 500 [IU]/h via INTRAVENOUS
  Filled 2012-12-30: qty 250

## 2012-12-30 MED ORDER — CHLORHEXIDINE GLUCONATE 0.12 % MT SOLN
15.0000 mL | Freq: Two times a day (BID) | OROMUCOSAL | Status: DC
  Administered 2012-12-30 – 2013-01-10 (×23): 15 mL via OROMUCOSAL
  Filled 2012-12-30 (×23): qty 15

## 2012-12-30 MED ORDER — INSULIN ASPART 100 UNIT/ML ~~LOC~~ SOLN
1.0000 [IU] | SUBCUTANEOUS | Status: DC
  Administered 2012-12-30 – 2013-01-08 (×46): 1 [IU] via SUBCUTANEOUS

## 2012-12-30 MED ORDER — FUROSEMIDE 10 MG/ML IJ SOLN
120.0000 mg | Freq: Three times a day (TID) | INTRAVENOUS | Status: DC
  Administered 2012-12-30: 120 mg via INTRAVENOUS
  Filled 2012-12-30 (×2): qty 12

## 2012-12-30 MED ORDER — HEPARIN SODIUM (PORCINE) 1000 UNIT/ML IJ SOLN
INTRAMUSCULAR | Status: AC
  Administered 2012-12-30: 2.8 mL via INTRAVENOUS_CENTRAL
  Filled 2012-12-30: qty 3

## 2012-12-30 MED ORDER — SODIUM CHLORIDE 0.9 % IJ SOLN
10.0000 mL | Freq: Two times a day (BID) | INTRAMUSCULAR | Status: DC
  Administered 2012-12-30: 20 mL
  Administered 2012-12-31 – 2013-01-03 (×8): 10 mL
  Administered 2013-01-04: 20 mL
  Administered 2013-01-04 – 2013-01-05 (×2): 10 mL
  Administered 2013-01-05: 40 mL
  Administered 2013-01-06: 10 mL
  Administered 2013-01-06: 40 mL
  Administered 2013-01-07 – 2013-01-08 (×4): 10 mL
  Administered 2013-01-09: 30 mL
  Administered 2013-01-09 – 2013-01-10 (×2): 10 mL
  Administered 2013-01-11: 30 mL
  Administered 2013-01-12 – 2013-01-15 (×5): 10 mL
  Administered 2013-01-16: 3 mL

## 2012-12-30 MED ORDER — PROMOTE PO LIQD
1000.0000 mL | ORAL | Status: DC
  Administered 2012-12-30 – 2013-01-03 (×3): 1000 mL
  Filled 2012-12-30 (×6): qty 1000

## 2012-12-30 MED ORDER — SODIUM CHLORIDE 0.9 % IV SOLN
20.0000 mmol | Freq: Once | INTRAVENOUS | Status: AC
  Administered 2012-12-30: 20 mmol via INTRAVENOUS
  Filled 2012-12-30: qty 20

## 2012-12-30 MED ORDER — INSULIN GLARGINE 100 UNIT/ML ~~LOC~~ SOLN
10.0000 [IU] | SUBCUTANEOUS | Status: DC
  Administered 2012-12-30 – 2013-01-07 (×9): 10 [IU] via SUBCUTANEOUS
  Filled 2012-12-30 (×15): qty 0.1

## 2012-12-30 MED ORDER — SODIUM CHLORIDE 0.9 % IV SOLN
INTRAVENOUS | Status: DC
  Administered 2012-12-30: 2 [IU]/h via INTRAVENOUS
  Filled 2012-12-30: qty 1

## 2012-12-30 MED ORDER — SODIUM CHLORIDE 0.9 % IJ SOLN: 10.0000 mL | INTRAMUSCULAR | Status: DC | PRN

## 2012-12-30 MED FILL — Rocuronium Bromide IV Soln 100 MG/10ML (10 MG/ML): INTRAVENOUS | Qty: 10 | Status: AC

## 2012-12-30 MED FILL — Medication: Qty: 1 | Status: AC

## 2012-12-30 MED FILL — Succinylcholine Chloride Inj 20 MG/ML: INTRAMUSCULAR | Qty: 10 | Status: AC

## 2012-12-30 NOTE — Progress Notes (Signed)
eLink Physician-Brief Progress Note Patient Name: Nicholas Potts DOB: [birth year] MRN: [id number]  Date of Service  12/30/2012   HPI/Events of Note  hypophosphatemia  eICU Interventions  Phos replaced   Intervention Category Intermediate Interventions: Electrolyte abnormality - evaluation and management  DETERDING,ELIZABETH 12/30/2012, 5:51 AM

## 2012-12-30 NOTE — Progress Notes (Signed)
PULMONARY  / CRITICAL CARE MEDICINE  Name: Nicholas Potts MRN: [id number] DOB: [birth year]    ADMISSION DATE:  12/29/2012  REFERRING MD :  EDP- Dr. Dan Humphreys PRIMARY SERVICE: PCCM  CHIEF COMPLAINT:  Acute Overdose  BRIEF PATIENT DESCRIPTION: [name] who presented to [facility] ER on 7/24 with AMS related to oxycodone overdose.  Underwent R ankle surgery 7/20 with RLE cast in place on admit.   SIGNIFICANT EVENTS / STUDIES:  7/20 - R ankle surgery (? Type, no MC Records) ........................................................................................................ 7/24 - Admit to [facility] with ER with acute oxy IR OD  LINES / TUBES: L [place] TLC 7/24>>> OETT 7/24>>> Aline 7/24>>> Femoral swan ganz 7/24>>> Impala device 7/24>>>  CULTURES: Sputum 7/24>>> BCx2 7/24>>> UC 7/24>>>  ANTIBIOTICS: Vanco 7/24>>>> Zosyn 7/2/4>>>  SUBJECTIVE: No events overnight  VITAL SIGNS: Temp:  [93.7 F (34.3 C)-99.9 F (37.7 C)] 99.9 F (37.7 C) (07/25 1000) Pulse Rate:  [89-106] 90 (07/25 0839) Resp:  [0-45] 35 (07/25 1000) BP: (66-164)/(18-110) 115/85 mmHg (07/25 1000) SpO2:  [86 %-100 %] 96 % (07/25 1000) Arterial Line BP: (117-144)/(78-98) 137/88 mmHg (07/25 1000) FiO2 (%):  [60 %-100 %] 80 % (07/25 0839) Weight:  [121.564 kg (268 lb)-135.8 kg (299 lb 6.2 oz)] 135.8 kg (299 lb 6.2 oz) (07/25 0500)  HEMODYNAMICS: PAP: (36-55)/(24-35) 37/26 mmHg CVP:  [15 mmHg-23 mmHg] 16 mmHg PCWP:  [20 mmHg-26 mmHg] 20 mmHg CO:  [6.3 L/min-6.5 L/min] 6.3 L/min CI:  [2.5 L/min/m2-2.6 L/min/m2] 2.5 L/min/m2  VENTILATOR SETTINGS: Vent Mode:  [-] PRVC FiO2 (%):  [60 %-100 %] 80 % Set Rate:  [12 bmp-35 bmp] 35 bmp Vt Set:  [500 mL-600 mL] 550 mL PEEP:  [5 cmH20] 5 cmH20 Plateau Pressure:  [20 cmH20-30 cmH20] 26 cmH20  INTAKE / OUTPUT: Intake/Output     07/24 0701 - 07/25 0700 07/25 0701 - 07/26 0700   I.V. (mL/kg) 2309.7 (17) 625.7 (4.6)   Other 30    IV Piggyback 820    Total Intake(mL/kg)  3159.7 (23.3) 625.7 (4.6)   Urine (mL/kg/hr) 390 20 (0)   Total Output 390 20   Net +2769.7 +605.7         PHYSICAL EXAMINATION: General:  Chronically ill in NAD on vent Neuro:  Altered, sedate on vent, pupils 3mm  HEENT:  OETT, mm pink/moist, short thick neck Cardiovascular:  s1s2 distant, regular Lungs:  resp's even/non-labored, lungs bilaterally coarse Abdomen:  Round/soft, bsx4 active Musculoskeletal:  No acute deformities, RLE with partial hard cast, good cap refill in LE Skin:  Cool, dry, no edema  LABS:  Recent Labs Lab 12/29/12 1430  12/29/12 2334 12/30/12 0037 12/30/12 0050 12/30/12 0215 12/30/12 0325 12/30/12 0349 12/30/12 0500  HGB 16.3  --   --   --   --  13.2  --   --   --   WBC 17.8*  --   --   --   --  21.5*  --   --   --   PLT 180  --   --   --   --  120*  --   --   --   NA 137  --   --   --  138  --   --   --   --   K 6.2*  --   --   --  5.0  --   --   --   --   CL 97  --   --   --  101  --   --   --   --   CO2 21  --   --   --  24  --   --   --   --   GLUCOSE 163*  --   --   --  269*  --   --   --   --   BUN 19  --   --   --  30*  --   --   --   --   CREATININE 1.76*  --   --   --  2.51*  --   --   --   --   CALCIUM 8.4  --   --   --  7.6*  --   --   --   --   MG  --   --   --   --  2.2  --   --   --   --   PHOS  --   --   --   --  0.6*  --   --   --   --   AST 703*  --   --   --  >5000*  --   --   --   --   ALT 791*  --   --   --  4490*  --   --   --   --   ALKPHOS 121*  --   --   --  91  --   --   --   --   BILITOT 0.6  --   --   --  2.1*  --   --   --   --   PROT 7.1  --   --   --  4.9*  --   --   --   --   ALBUMIN 3.2*  --   --   --  2.3*  --   --   --   --   INR  --   --   --   --   --   --  2.76*  --   --   LATICACIDVEN 9.4*  --   --   --   --   --   --   --   --   PROBNP  --   --   --   --   --   --   --   --  3296.0*  PHART  --   < > 7.332* 7.351  --   --   --  7.448  --   PCO2ART  --   < > 34.1* 35.7  --   --   --  32.9*  --   PO2ART  --    < > 83.0 85.0  --   --   --  99.0  --   < > = values in this interval not displayed.  Recent Labs Lab 12/30/12 0520 12/30/12 0617 12/30/12 0724 12/30/12 0827 12/30/12 0930  GLUCAP 229* 224* 217* 168* 168*   CXR: 7/24  - low lung volumes, concern for aspiration  ASSESSMENT / PLAN:  PULMONARY A: Acute Respiratory Failure - in setting of OD +/- PE Hypoxemia  Rule out DVT Rule out Aspiration PNA P:   - Full vent support, increase PEEP to 14 (BP tolerated PEEP) with repeat ABG. - CTA to rule out PE given hypoxemia but renal function precludes it, will continue heparin. - Sedation protocol for goal  RASS of 0 to -1. - Wean PEEP/FiO2 for sats > 92% as tolerated.  CARDIOVASCULAR A:  Shock - in setting of acute oxycodone overdose.  Likely Metabolic Syndrome  P:  - Levophed to support MAP > 65. - Per cards. - Impella device. - Pressors and swan noted.  RENAL A:   Acute Kidney Injury - secondary to overdose, ? Prolonged down time Hyperkalemia Anion Gap Acidosis - lactic  P:   - Hydration according to PCWP. - Follow UOP / serum creatinine, UOP is very low, will consult renal. - D/C bicarb drip. - Will place Trialysis catheter for HD today. - Renal consult called.  GASTROINTESTINAL A:   Morbid Obesity Increased LFT's P:   - Protonix - Start TF. - OGT. - Follow LFT's  HEMATOLOGIC A:   Macrocytic Anemia - ??ETOH abuse  P:  - Folate/MVI/thiamine  INFECTIOUS A:   Rule out Aspiration PNA P:   - Pan culture - Vanco/zosyn for aspiration - recent admit for R ankle.  Uncertain of length of stay.  If clarified, can narrow for aspiration coverage  ENDOCRINE A:   Concern for Metabolic Syndrome    P:   - SSI - Check A1C  NEUROLOGIC A:   Acute Encephalopathy - in setting of overdose, known oxy IR +/- other substances ? ETOH Abuse P:   - CT head to r/o acute event, if negative empiric heparin until CTA chest obtained. - Supportive care. - Sedation protocol  with versed/fentanyl.  I have personally obtained a history, examined the patient, evaluated laboratory and imaging results, formulated the assessment and plan and placed orders.  CRITICAL CARE: The patient is critically ill with multiple organ systems failure and requires high complexity decision making for assessment and support, frequent evaluation and titration of therapies, application of advanced monitoring technologies and extensive interpretation of multiple databases. Critical Care Time devoted to patient care services described in this note is 35 minutes.   Alyson Reedy, M.D. [facility] Pulmonary/Critical Care Medicine. Pager: [phone]. After hours pager: [phone].  12/30/2012, 10:44 AM

## 2012-12-30 NOTE — Progress Notes (Signed)
    Subjective:  Intubated, sedated. Events overnight noted. Nursing reports patient had some movements with withdrawal to pain.   Objective:  Vital Signs in the last 24 hours: Temp:  [93.7 F (34.3 C)-99.5 F (37.5 C)] 99.5 F (37.5 C) (07/25 0700) Pulse Rate:  [89-106] 91 (07/25 0700) Resp:  [0-45] 35 (07/25 0700) BP: (66-164)/(18-110) 115/82 mmHg (07/25 0600) SpO2:  [86 %-100 %] 100 % (07/25 0700) Arterial Line BP: (117-138)/(78-98) 138/98 mmHg (07/25 0700) FiO2 (%):  [60 %-100 %] 100 % (07/25 0400) Weight:  [121.564 kg (268 lb)-135.8 kg (299 lb 6.2 oz)] 135.8 kg (299 lb 6.2 oz) (07/25 0500)  Intake/Output from previous day: 07/24 0701 - 07/25 0700 In: 2963.8 [I.V.:2113.8; IV Piggyback:820] Out: 390 [Urine:390]  Physical Exam: Pt is intubated, sedated, obese male HEENT: normal Neck: JVP - unable to visualize Lungs: CTA bilaterally CV: RRR without murmur or gallop Abd: soft, obese Ext: diffuse 1+ edema Skin: warm/dry no rash Neuro: unresponsive  Lab Results:  Recent Labs  12/29/12 1430 12/30/12 0215  WBC 17.8* 21.5*  HGB 16.3 13.2  PLT 180 120*    Recent Labs  12/29/12 1430 12/30/12 0050  NA 137 138  K 6.2* 5.0  CL 97 101  CO2 21 24  GLUCOSE 163* 269*  BUN 19 30*  CREATININE 1.76* 2.51*   No results found for this basename: TROPONINI, CK, MB,  in the last 72 hours  Cardiac Studies: CXR: bilateral pulmonary infiltrates  Tele: sinus rhythm, no significant arrhythmia  Assessment/Plan:  1. Cardiogenic shock - post-arrest. Hemodynamics reviewed with CO 6.5, CI 2.6, PCWP 26, and CVP 21. Continue Impella support. On minimal vasopressors. Follow hemodynamics Q6 hours. Very severe LV dysfunction after prolonged cardiac arrest.  2. Cardiopulmonary arrest - still not entirely clear of etiology. Think most likely PE versus hypoxia/hypercarbia from obesity/sleep apnea/post-op narcotics. No significant CAD at cath. Continue IV heparin at low-dose per Impella  protocol.   3. Acute oliguric renal failure - secondary to shock. Will ask renal to see today as he is high risk of requiring at least short term RRT.  4. Anoxic encephalopathy - too early to prognosticate. Reports of withdrawal to pain are good sign.  5. VDRF - appreciate management of CCM team. CXR with bilateral diffuse airspace disease. Suspect cardiogenic pulmonary edema, but also at risk for acute lung injury/ARDS. With elevated CVP/PCWP, will start IV diuretics to try to stimulate urine output.  6. Acute hepatic injury with shock liver  The patient is critically ill with multiple organ systems failure and requires high complexity decision making for assessment and support, frequent evaluation and titration of therapies, application of advanced monitoring technologies and extensive interpretation of multiple databases.   Critical Care Time devoted to patient care services described in this note is 40 minutes.  Tonny Bollman, M.D. 12/30/2012, 7:05 AM

## 2012-12-30 NOTE — Progress Notes (Signed)
  Echocardiogram 2D Echocardiogram has been performed.  Georgian Co 12/30/2012, 9:06 AM

## 2012-12-30 NOTE — Progress Notes (Signed)
Subjective: Pt is POD 3 from right achilles tendon debridement and reconstruction, gastroc recession and Haglund excision done as an outpatient at [facility].  He is admitted after being found unresponsive by his daughter yesterday.  Notes reviewed from admission to present.  Pt intubated and sedated in the ICU.   Objective: Vital signs in last 24 hours: Temp:  [95.4 F (35.2 C)-100.6 F (38.1 C)] 100.4 F (38 C) (07/25 1700) Pulse Rate:  [89-99] 96 (07/25 1613) Resp:  [0-35] 0 (07/25 1700) BP: (76-164)/(40-88) 114/81 mmHg (07/25 1613) SpO2:  [89 %-100 %] 94 % (07/25 1700) Arterial Line BP: (117-144)/(78-98) 125/79 mmHg (07/25 1700) FiO2 (%):  [70 %-100 %] 70 % (07/25 1613) Weight:  [133.3 kg (293 lb 14 oz)-135.8 kg (299 lb 6.2 oz)] 135.8 kg (299 lb 6.2 oz) (07/25 0500)  Intake/Output from previous day: 07/24 0701 - 07/25 0700 In: 3159.7 [I.V.:2309.7; IV Piggyback:820] Out: 390 [Urine:390] Intake/Output this shift: Total I/O In: 1687.5 [I.V.:1191.5; NG/GT:120; IV Piggyback:376] Out: 120 [Urine:120]   Recent Labs  12/29/12 1430 12/30/12 0215 12/30/12 1300  HGB 16.3 13.2 12.8*    Recent Labs  12/30/12 0215 12/30/12 1300  WBC 21.5* 19.5*  RBC 4.10* 4.04*  HCT 40.7 39.4  PLT 120* 130*    Recent Labs  12/30/12 0050 12/30/12 1300  NA 138 142  K 5.0 3.6  CL 101 100  CO2 24 29  BUN 30* 41*  CREATININE 2.51* 3.35*  GLUCOSE 269* 122*  CALCIUM 7.6* 7.5*    Recent Labs  12/30/12 0325  INR 2.76*    PE:  R LE immobilized in a splint.  Last night I took the splint down and the incisions appeared normal for POD 2.  No signs of active bleeding.  No erythema, warmth, drainage.  TOday the toes are warm and well perfused with brisk cap refil.  Assessment/Plan: S/p R achilles tendon reconstruction with respiratory and cardiac arrest.  Splint can be removed any time to eval wounds as needed.  I'll continue to follow.   Toni Arthurs 12/30/2012, 5:29 PM

## 2012-12-30 NOTE — Progress Notes (Signed)
Md made changes to vent

## 2012-12-30 NOTE — Procedures (Signed)
Central Venous Catheter Insertion Procedure Note Nicholas Potts [id number] 04-26-1961  Procedure: Insertion of Central Venous Catheter Indications: CVVH  Procedure Details Consent: Risks of procedure as well as the alternatives and risks of each were explained to the (patient/caregiver).  Consent for procedure obtained. Time Out: Verified patient identification, verified procedure, site/side was marked, verified correct patient position, special equipment/implants available, medications/allergies/relevent history reviewed, required imaging and test results available.  Performed  Maximum sterile technique was used including antiseptics, cap, gloves, gown, hand hygiene, mask and sheet. Skin prep: Chlorhexidine; local anesthetic administered A antimicrobial bonded/coated trialysis catheter catheter was placed in the right internal jugular vein using the Seldinger technique.  Evaluation Blood flow good Complications: No apparent complications Patient did tolerate procedure well. Chest X-ray ordered to verify placement.  CXR: pending.  U/S used in placement.  YACOUB,WESAM 12/30/2012, 12:16 PM

## 2012-12-30 NOTE — Progress Notes (Signed)
ANTIBIOTIC CONSULT NOTE - FOLLOW UP  Pharmacy Consult for Vancomycin and Zosyn Indication: rule out pneumonia  No Known Allergies  Patient Measurements: Height: 6' (182.9 cm) Weight: 299 lb 6.2 oz (135.8 kg) IBW/kg (Calculated) : 77.6 Adjusted Body Weight:   Vital Signs: Temp: 100.2 F (37.9 C) (07/25 1500) Temp src: Core (Comment) (07/25 1200) BP: 113/79 mmHg (07/25 1400) Pulse Rate: 90 (07/25 0839) Intake/Output from previous day: 07/24 0701 - 07/25 0700 In: 3159.7 [I.V.:2309.7; IV Piggyback:820] Out: 390 [Urine:390] Intake/Output from this shift: Total I/O In: 1452.3 [I.V.:1082.3; NG/GT:60; IV Piggyback:310] Out: 95 [Urine:95]  Labs:  Recent Labs  12/29/12 1430 12/30/12 0050 12/30/12 0215 12/30/12 1300  WBC 17.8*  --  21.5* 19.5*  HGB 16.3  --  13.2 12.8*  PLT 180  --  120* 130*  CREATININE 1.76* 2.51*  --  3.35*   Estimated Creatinine Clearance: 36.4 ml/min (by C-G formula based on Cr of 3.35).  Recent Labs  12/30/12 1155  VANCORANDOM 22.7     Microbiology: Recent Results (from the past 720 hour(s))  CULTURE, BLOOD (ROUTINE X 2)     Status: None   Collection Time    12/29/12  3:27 PM      Result Value Range Status   Specimen Description BLOOD HAND LEFT   Final   Special Requests BOTTLES DRAWN AEROBIC ONLY 5CC   Final   Culture  Setup Time 12/29/2012 22:48   Final   Culture     Final   Value:        BLOOD CULTURE RECEIVED NO GROWTH TO DATE CULTURE WILL BE HELD FOR 5 DAYS BEFORE ISSUING A FINAL NEGATIVE REPORT   Report Status PENDING   Incomplete  CULTURE, BLOOD (ROUTINE X 2)     Status: None   Collection Time    12/29/12  3:37 PM      Result Value Range Status   Specimen Description BLOOD ARM LEFT   Final   Special Requests BOTTLES DRAWN AEROBIC ONLY 2CC   Final   Culture  Setup Time 12/29/2012 22:48   Final   Culture     Final   Value:        BLOOD CULTURE RECEIVED NO GROWTH TO DATE CULTURE WILL BE HELD FOR 5 DAYS BEFORE ISSUING A FINAL  NEGATIVE REPORT   Report Status PENDING   Incomplete  MRSA PCR SCREENING     Status: None   Collection Time    12/30/12  2:24 AM      Result Value Range Status   MRSA by PCR NEGATIVE  NEGATIVE Final   Comment:            The GeneXpert MRSA Assay (FDA     approved for NASAL specimens     only), is one component of a     comprehensive MRSA colonization     surveillance program. It is not     intended to diagnose MRSA     infection nor to guide or     monitor treatment for     MRSA infections.    Anti-infectives   Start     Dose/Rate Route Frequency Ordered Stop   12/30/12 1000  vancomycin (VANCOCIN) 1,250 mg in sodium chloride 0.9 % 250 mL IVPB  Status:  Discontinued     1,250 mg 166.7 mL/hr over 90 Minutes Intravenous Every 12 hours 12/29/12 1625 12/30/12 0959   12/29/12 2200  piperacillin-tazobactam (ZOSYN) IVPB 3.375 g     3.375 g  12.5 mL/hr over 240 Minutes Intravenous Every 8 hours 12/29/12 1834     12/29/12 2200  vancomycin (VANCOCIN) 2,000 mg in sodium chloride 0.9 % 500 mL IVPB     2,000 mg 250 mL/hr over 120 Minutes Intravenous  Once 12/29/12 2148 12/30/12 0236   12/29/12 1545  [MAR Hold]  vancomycin (VANCOCIN) 2,000 mg in sodium chloride 0.9 % 500 mL IVPB  Status:  Discontinued     (On MAR Hold since 12/29/12 1946)   2,000 mg 250 mL/hr over 120 Minutes Intravenous To Emergency Dept 12/29/12 1528 12/29/12 2148   12/29/12 1530  [MAR Hold]  piperacillin-tazobactam (ZOSYN) IVPB 3.375 g  Status:  Discontinued     (On MAR Hold since 12/29/12 1946)   3.375 g 100 mL/hr over 30 Minutes Intravenous  Once 12/29/12 1519 12/29/12 2149   12/29/12 1515  piperacillin-tazobactam (ZOSYN) IVPB 4.5 g  Status:  Discontinued     4.5 g 200 mL/hr over 30 Minutes Intravenous  Once 12/29/12 1507 12/29/12 1517      Assessment: 51yom continuing on Vancomycin and Zosyn for possible PNA (aspiration?).Patient's SCr has continued to increase (1.76-->3.35) and UOP is minimal (~563ml). Vancomycin 2g  was given last PM - random level was checked ~12 hours post dose to evaluate medication clearance. Patient is not clearing Vancomycin as level was reported as 22.7 mcg/ml - will not give additional Vancomycin today and check random Vancomycin level in AM to re-evaluate clearance.   Goal of Therapy:  Vancomycin trough level 15-20 mcg/ml  Plan:  1. No additional Vancomycin today 2. Random Vancomycin level with AM labs tomorrow 3. Continue Zosyn 3.375g IV q8h 4. Monitor renal function, cultures, clinical course and adjust as indicated  Cleon Dew [phone] 12/30/2012,3:28 PM

## 2012-12-30 NOTE — Progress Notes (Signed)
Follow up. Patient's sister [name] in waiting area. Other family had gone home to rest. Presence; listening.

## 2012-12-30 NOTE — Care Management Note (Signed)
    Page 1 of 1   12/30/2012     8:00:59 AM   CARE MANAGEMENT NOTE 12/30/2012  Patient:  Nicholas Potts, Nicholas Potts   Account Number:  [id number]  Date Initiated:  12/30/2012  Documentation initiated by:  Junius Creamer  Subjective/Objective Assessment:   adm w resp failure     Action/Plan:   lives w wife   Anticipated DC Date:     Anticipated DC Plan:        DC Planning Services  CM consult      Choice offered to / List presented to:             Status of service:   Medicare Important Message given?   (If response is "NO", the following Medicare IM given date fields will be blank) Date Medicare IM given:   Date Additional Medicare IM given:    Discharge Disposition:    Per UR Regulation:  Reviewed for med. necessity/level of care/duration of stay  If discussed at Long Length of Stay Meetings, dates discussed:    Comments:

## 2012-12-30 NOTE — Progress Notes (Addendum)
When I arrived, pt's nurse was bedside and informed me that pt's family is in lobby. Will ck w/family in lobby area. Elmarie Shiley Holder Chaplain  Visited w/pt's family (daughter, son and other friends) in lobby. Family was grateful for family support. Family is of  [employer]. Marjory Lies Chaplain

## 2012-12-30 NOTE — Progress Notes (Signed)
VASCULAR LAB PRELIMINARY  PRELIMINARY  PRELIMINARY  PRELIMINARY  Bilateral lower extremity venous duplex  completed.    Preliminary report:  Bilateral:  No obvious evidence of DVT, superficial thrombosis, or Baker's Cyst.  Unable to image right calf due to cast and left groin due to swan and impella device.    Nicholson Starace, RVT 12/30/2012, 11:12 AM

## 2012-12-30 NOTE — Progress Notes (Signed)
INITIAL NUTRITION ASSESSMENT  DOCUMENTATION CODES Per approved criteria  -Morbid Obesity   INTERVENTION: 1.  Enteral nutrition; initiate Promote @ 20 mL/hr continuous.  Advance by 10 mL q 4 hrs to 40 goal with Prostat 60 mL (2 packets) 5 times daily to provide 1960 kcal, 210g protein, 805 mL free water.  NUTRITION DIAGNOSIS: Inadequate oral intake related to mechanical ventilation, inability to eat as evidenced by NPO, intubated.   Monitor:  1.  Enteral nutrition; initiation with tolerance.  Enteral nutrition to provide 60-70% of estimated calorie needs (22-25 kcals/kg ideal body weight) and 100% of estimated protein needs, based on ASPEN guidelines for permissive underfeeding in critically ill obese individuals. 2.  Wt/wt change; monitor trends  Reason for Assessment: vent  52 y.o. male  Admitting Dx: respiratory failure  ASSESSMENT: Pt with recent Achilles tendon surgery admitted with AMS r/t to oxycodone overdose.  Developed PEA arrest and prolonged CPR.  Pt now with shock.  Patient is currently intubated on ventilator support.  Pt has OGT. MV: 19.3 L/min  Temp:Temp (24hrs), Avg:97.5 F (36.4 C), Min:93.7 F (34.3 C), Max:99.9 F (37.7 C)  No family at bedside.  Height: Ht Readings from Last 1 Encounters:  12/29/12 6' (1.829 m)    Weight: Wt Readings from Last 1 Encounters:  12/30/12 299 lb 6.2 oz (135.8 kg)    Ideal Body Weight: 80.9 kg  % Ideal Body Weight: 167%  Wt Readings from Last 10 Encounters:  12/30/12 299 lb 6.2 oz (135.8 kg)  12/30/12 299 lb 6.2 oz (135.8 kg)    Usual Body Weight: unknown, no family at bedside.   BMI:  Body mass index is 40.59 kg/(m^2).  Estimated Nutritional Needs: Kcal: 2833 Permissive underfeeding: 3086-5784 Protein: >/=202g Fluid: ~2.4 L/day or per MD discretion  Skin: generalized edema  Diet Order: NPO  EDUCATION NEEDS: -Education not appropriate at this time   Intake/Output Summary (Last 24 hours) at 12/30/12  1002 Last data filed at 12/30/12 0900  Gross per 24 hour  Intake 3572.52 ml  Output    395 ml  Net 3177.52 ml    Last BM: PTA  Labs:   Recent Labs Lab 12/29/12 1430 12/30/12 0050  NA 137 138  K 6.2* 5.0  CL 97 101  CO2 21 24  BUN 19 30*  CREATININE 1.76* 2.51*  CALCIUM 8.4 7.6*  MG  --  2.2  PHOS  --  0.6*  GLUCOSE 163* 269*    CBG (last 3)   Recent Labs  12/30/12 0358 12/30/12 0520 12/30/12 0617  GLUCAP 260* 229* 224*    Scheduled Meds: . antiseptic oral rinse  15 mL Mouth Rinse QID  . chlorhexidine  15 mL Mouth Rinse BID  . furosemide  120 mg Intravenous Q8H  . hydrocortisone sodium succinate  50 mg Intravenous Q6H  . impella catheter heparin 50 unit/mL  25,000 Units Intracatheter Q24H  . pantoprazole (PROTONIX) IV  40 mg Intravenous QHS  . piperacillin-tazobactam (ZOSYN)  IV  3.375 g Intravenous Q8H  . banana bag IV 1000 mL   Intravenous Once  . sodium chloride  10-40 mL Intracatheter Q12H  . sodium chloride  3 mL Intravenous Q12H  . sodium glycerophosphate 0.9% NaCl IVPB  20 mmol Intravenous Once   Followed by  . sodium glycerophosphate 0.9% NaCl IVPB  10 mmol Intravenous Once  . sodium polystyrene  30 g Per Tube Once    Continuous Infusions: . sodium chloride    . sodium chloride    .  amiodarone (NEXTERONE) IV bolus only 150 mg/100 mL    . DOBUTamine 2.5 mcg/kg/min (12/30/12 0348)  . fentaNYL infusion INTRAVENOUS 100 mcg/hr (12/29/12 2326)  . heparin 500 Units/hr (12/30/12 0101)  . insulin (NOVOLIN-R) infusion 4.3 mL/hr at 12/30/12 0830  . midazolam (VERSED) infusion 2 mg/hr (12/30/12 0032)  . norepinephrine (LEVOPHED) Adult infusion Stopped (12/30/12 0030)  . phenylephrine (NEO-SYNEPHRINE) Adult infusion 100 mcg/min (12/29/12 1808)  .  sodium bicarbonate  infusion 1000 mL 150 mL/hr at 12/30/12 0751  . vasopressin (PITRESSIN) infusion - *FOR SHOCK* Stopped (12/29/12 2300)    Past Medical History  Diagnosis Date  . Hyperlipidemia   . OSA  (obstructive sleep apnea)   . CVA (cerebral infarction)     Past Surgical History  Procedure Laterality Date  . Achilles tendon repair      Loyce Dys, MS RD LDN Clinical Inpatient Dietitian Pager: [phone] Weekend/After hours pager: [phone]

## 2012-12-30 NOTE — Progress Notes (Signed)
Impela flow noted to be down from 3.4-3.5 to 2.4-2.5L/min. I called the representative from [facility] who advised me to notify MD and suggested another Echo with possible placement adjustment. Dr. Terressa Koyanagi notifed Madilyn Fireman, [place]

## 2012-12-30 NOTE — Progress Notes (Signed)
Per RN, MD increased peep to 14

## 2012-12-30 NOTE — Consult Note (Signed)
Nicholas Potts is an 52 y.o. male referred by Dr Molli Knock   Chief Complaint: ARF HPI: 52 yo male SP Rt ankle surgery 7/20 who presented to ER with AMS thought to be due to narcotic usage.  In ER he coded 3 times and was taken emergently to cath lab and had Rt and Lt heart cath(nonobstructive ds) and placement of impella device.  Scr 1.76 on adm and now 2.5 with minimal UO.  Past Medical History  Diagnosis Date  . Hyperlipidemia   . OSA (obstructive sleep apnea)   . CVA (cerebral infarction)     Past Surgical History  Procedure Laterality Date  . Achilles tendon repair      No family history on file.unobtainable as pt intubated Social History:  reports that  drinks alcohol. He reports that he uses illicit drugs. His tobacco history is not on file.  Allergies: No Known Allergies  No prescriptions prior to admission     Lab Results: UA: 0-2 wbc, 11-20 rbc >300 protein   Recent Labs  12/29/12 1430 12/30/12 0215  WBC 17.8* 21.5*  HGB 16.3 13.2  HCT 52.2* 40.7  PLT 180 120*   BMET  Recent Labs  12/29/12 1430 12/30/12 0050  NA 137 138  K 6.2* 5.0  CL 97 101  CO2 21 24  GLUCOSE 163* 269*  BUN 19 30*  CREATININE 1.76* 2.51*  CALCIUM 8.4 7.6*  PHOS  --  0.6*   LFT  Recent Labs  12/30/12 0050  PROT 4.9*  ALBUMIN 2.3*  AST 6882*  ALT 4490*  ALKPHOS 91  BILITOT 2.1*   Ct Head Wo Contrast  12/29/2012   *RADIOLOGY REPORT*  Clinical Data: Altered mental status.  Possible overdose.  CT HEAD WITHOUT CONTRAST  Technique:  Contiguous axial images were obtained from the base of the skull through the vertex without contrast.  Comparison: Head CT 12/10/2007.  Findings: There is no evidence of acute intracranial hemorrhage, mass lesion, brain edema or extra-axial fluid collection.  The ventricles and subarachnoid spaces are appropriately sized for age. There is no CT evidence of acute cortical infarction.  There is diffuse mucosal thickening throughout the nasal passages,  ethmoid and maxillary sinuses.  The nasopharyngeal airway is closed at the time of imaging (the patient is intubated).  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  The mastoids and middle ears are clear. The calvarium is intact.  IMPRESSION:  1.  No acute intracranial or calvarial findings identified. 2.  Diffuse mucosal thickening throughout the visualized nasal passages and paranasal sinuses. Significant maxillary periodontal disease bilaterally.   Original Report Authenticated By: Carey Bullocks, M.D.   Dg Chest Port 1 View  12/30/2012   *RADIOLOGY REPORT*  Clinical Data: Endotracheal tube placement.  PORTABLE CHEST - 1 VIEW  Comparison: 12/29/2012.  Findings: Low volume chest.  The endotracheal tube tip is 24 mm from the carina.  Defibrillator pads over the chest.  Enteric tube is present with the tip not visualized.  Inferior presumed femoral approach Swan-Ganz catheter is present with the tip in the descending right pulmonary artery.  Progressive atelectasis/volume loss in the right middle and right lower lobes.  Prominent atelectasis associated with lower lung volumes.  Pulmonary vascular congestion is accentuated by the low volumes. Left subclavian central line remains present with the tip in the mid to lower SVC.  IMPRESSION:  1.  Stable support apparatus aside from the addition of what appears to be a femoral approach Swan-Ganz catheter with the  tip in the descending right pulmonary artery. 2.  Lower lung volumes with increasing atelectasis, most prominent at the right base.   Original Report Authenticated By: Andreas Newport, M.D.   Dg Chest Portable 1 View  12/29/2012   *RADIOLOGY REPORT*  Clinical Data: Unresponsive  PORTABLE CHEST - 1 VIEW  Comparison: Chest radiograph 12/29/2012  Findings: Endotracheal tube is 3.5 cm from carina.  Left central venous line is unchanged.  Stable cardiac silhouette.  There is central venous congestion similar prior.  There is bibasilar  atelectasis greater on the right not changed.  IMPRESSION:  1.  Stable support apparatus. 2.  No interval change.  3.  Low lung volumes, basilar atelectasis, and central venous congestion.   Original Report Authenticated By: Genevive Bi, M.D.   Dg Chest Port 1 View  12/29/2012   *RADIOLOGY REPORT*  Clinical Data: Evaluate endotracheal tube and left subclavian line placement  PORTABLE CHEST - 1 VIEW  Comparison: Earlier same day  Findings:  Grossly unchanged enlarged cardiac silhouette and mediastinal contours, possibly attributable to decreased lung volumes and AP projection.  An endotracheal tube overlies the tracheal air column with tip superior to the carina.  Interval placement of a left subclavian vein approach central venous catheter with tip projecting over the superior cavoatrial junction.  Enteric tube tip and side port project below the left hemidiaphragm.  No pneumothorax.  Pulmonary vasculature is indistinct with cephalization of flow.  Small/trace bilateral effusions are suspected with minimal amount of fluid layering within the right minor fissure.  Worsening perihilar opacities.  Unchanged bones.  IMPRESSION: 1.  Appropriately positioned support apparatus as above.  No pneumothorax. 2.  Suspected pulmonary edema. 3.  Reduced lung volumes with worsening perihilar opacities, likely atelectasis.  findings suggestive of pulmonary edema   Original Report Authenticated By: Tacey Ruiz, MD   Dg Chest Portable 1 View  12/29/2012   *RADIOLOGY REPORT*  Clinical Data: Status post intubation  PORTABLE CHEST - 1 VIEW  Comparison: None.  Findings: An endotracheal tube and nasogastric catheter are noted. Nasogastric catheter appears coiled within the distal esophagus. The endotracheal tube is in satisfactory position.  The lungs are well-aerated with some mild right basilar atelectasis.   IMPRESSION: Tubes and lines as described above.  Mild right basilar atelectasis.   Original Report Authenticated By:  Alcide Clever, M.D.    ROS: pt intubated  PHYSICAL EXAM: Blood pressure 115/85, pulse 90, temperature 100.2 F (37.9 C), temperature source Core (Comment), resp. rate 35, height 6' (1.829 m), weight 135.8 kg (299 lb 6.2 oz), SpO2 97.00%. HEENT: Intubated NECK:obese.  Lt subclavian triple lumen LUNGS:clear ant CARDIAC:RRR woMRG ABD:+BS distended EXT:no edema.  Swan and impella lt groin NEURO:sedated  Assessment: 1. Acute vs acute on CKD (no previous labs)  Sec to ischemic ATN from hypotension +/- contrast from heart cath 2. Cardiac arrest 3. Elevated LFT's sec shock liver 4. hypoPO4 PLAN: 1. Place HD cath per CCM as anticipate need for CVVHD in near future 2. BP support as you are doing 3. Iv Lasix to try to stimulate UO 4. Daily labs 5. Replace PO4 as you are doing 6. Recheck bmet now 7. Dc bicarb gtt   Laurali Goddard T 12/30/2012, 11:57 AM

## 2012-12-31 ENCOUNTER — Inpatient Hospital Stay (HOSPITAL_COMMUNITY): Payer: BC Managed Care – PPO

## 2012-12-31 DIAGNOSIS — I469 Cardiac arrest, cause unspecified: Secondary | ICD-10-CM

## 2012-12-31 DIAGNOSIS — J9600 Acute respiratory failure, unspecified whether with hypoxia or hypercapnia: Secondary | ICD-10-CM

## 2012-12-31 DIAGNOSIS — R570 Cardiogenic shock: Secondary | ICD-10-CM

## 2012-12-31 LAB — BASIC METABOLIC PANEL
BUN: 60 mg/dL — ABNORMAL HIGH (ref 6–23)
Calcium: 7.2 mg/dL — ABNORMAL LOW (ref 8.4–10.5)
Calcium: 7.2 mg/dL — ABNORMAL LOW (ref 8.4–10.5)
Calcium: 7.1 mg/dL — ABNORMAL LOW (ref 8.4–10.5)
Chloride: 99 mEq/L (ref 96–112)
Chloride: 103 mEq/L (ref 96–112)
Creatinine, Ser: 4.54 mg/dL — ABNORMAL HIGH (ref 0.50–1.35)
Creatinine, Ser: 5.07 mg/dL — ABNORMAL HIGH (ref 0.50–1.35)
Creatinine, Ser: 5.08 mg/dL — ABNORMAL HIGH (ref 0.50–1.35)
GFR calc Af Amer: 16 mL/min — ABNORMAL LOW (ref >90)
GFR calc Af Amer: 14 mL/min — ABNORMAL LOW (ref >90)
GFR calc Af Amer: 14 mL/min — ABNORMAL LOW (ref >90)
GFR calc non Af Amer: 13 mL/min — ABNORMAL LOW (ref >90)
GFR calc non Af Amer: 12 mL/min — ABNORMAL LOW (ref >90)

## 2012-12-31 LAB — RENAL FUNCTION PANEL
BUN: 67 mg/dL — ABNORMAL HIGH (ref 6–23)
CO2: 27 mEq/L (ref 19–32)
Chloride: 102 mEq/L (ref 96–112)
Creatinine, Ser: 4.57 mg/dL — ABNORMAL HIGH (ref 0.50–1.35)
Potassium: 4 mEq/L (ref 3.5–5.1)

## 2012-12-31 LAB — POCT I-STAT 3, ART BLOOD GAS (G3+)
O2 Saturation: 94 %
Patient temperature: 98.6
pCO2 arterial: 34.4 mmHg — ABNORMAL LOW (ref 35.0–45.0)
pH, Arterial: 7.486 — ABNORMAL HIGH (ref 7.350–7.450)

## 2012-12-31 LAB — BLOOD GAS, ARTERIAL
Bicarbonate: 27.1 mEq/L — ABNORMAL HIGH (ref 20.0–24.0)
Drawn by: 31101
PEEP: 12 cmH2O
Patient temperature: 100
pH, Arterial: 7.461 — ABNORMAL HIGH (ref 7.350–7.450)

## 2012-12-31 LAB — POCT ACTIVATED CLOTTING TIME
Activated Clotting Time: 176 seconds
Activated Clotting Time: 176 seconds
Activated Clotting Time: 171 seconds

## 2012-12-31 LAB — CBC
HCT: 37.3 % — ABNORMAL LOW (ref 39.0–52.0)
MCH: 32 pg (ref 26.0–34.0)
MCHC: 33 g/dL (ref 30.0–36.0)
MCHC: 33.7 g/dL (ref 30.0–36.0)
MCV: 94.8 fL (ref 78.0–100.0)
Platelets: 122 10*3/uL — ABNORMAL LOW (ref 150–400)
Platelets: 113 10*3/uL — ABNORMAL LOW (ref 150–400)
Platelets: 107 10*3/uL — ABNORMAL LOW (ref 150–400)
RDW: 14.6 % (ref 11.5–15.5)
RDW: 14.7 % (ref 11.5–15.5)
RDW: 14.9 % (ref 11.5–15.5)
WBC: 22.4 10*3/uL — ABNORMAL HIGH (ref 4.0–10.5)
WBC: 24.3 10*3/uL — ABNORMAL HIGH (ref 4.0–10.5)

## 2012-12-31 LAB — HEPATIC FUNCTION PANEL
ALT: 3909 U/L — ABNORMAL HIGH (ref 0–53)
Indirect Bilirubin: 0.9 mg/dL (ref 0.3–0.9)
Total Protein: 4.5 g/dL — ABNORMAL LOW (ref 6.0–8.3)

## 2012-12-31 LAB — GLUCOSE, CAPILLARY
Glucose-Capillary: 145 mg/dL — ABNORMAL HIGH (ref 70–99)
Glucose-Capillary: 156 mg/dL — ABNORMAL HIGH (ref 70–99)
Glucose-Capillary: 156 mg/dL — ABNORMAL HIGH (ref 70–99)
Glucose-Capillary: 169 mg/dL — ABNORMAL HIGH (ref 70–99)

## 2012-12-31 LAB — URINE CULTURE: Colony Count: NO GROWTH

## 2012-12-31 LAB — MAGNESIUM: Magnesium: 2 mg/dL (ref 1.5–2.5)

## 2012-12-31 LAB — VANCOMYCIN, RANDOM: Vancomycin Rm: 17.8 ug/mL

## 2012-12-31 LAB — TROPONIN I: Troponin I: 0.88 ng/mL (ref <0.30)

## 2012-12-31 MED ORDER — HEPARIN SODIUM (PORCINE) 1000 UNIT/ML DIALYSIS
1000.0000 [IU] | INTRAMUSCULAR | Status: DC | PRN
  Filled 2012-12-31: qty 6

## 2012-12-31 MED ORDER — HEPARIN (PORCINE) IN NACL 100-0.45 UNIT/ML-% IJ SOLN
500.0000 [IU]/h | INTRAMUSCULAR | Status: DC
  Filled 2012-12-31: qty 250

## 2012-12-31 MED ORDER — VANCOMYCIN HCL 10 G IV SOLR
1250.0000 mg | INTRAVENOUS | Status: DC
  Administered 2012-12-31 – 2013-01-02 (×3): 1250 mg via INTRAVENOUS
  Filled 2012-12-31 (×3): qty 1250

## 2012-12-31 MED ORDER — PIPERACILLIN-TAZOBACTAM IN DEX 2-0.25 GM/50ML IV SOLN
2.2500 g | Freq: Four times a day (QID) | INTRAVENOUS | Status: DC
  Administered 2012-12-31 – 2013-01-02 (×8): 2.25 g via INTRAVENOUS
  Filled 2012-12-31 (×10): qty 50

## 2012-12-31 MED ORDER — NOREPINEPHRINE BITARTRATE 1 MG/ML IJ SOLN: 2.0000 ug/min | INTRAVENOUS | Status: DC

## 2012-12-31 MED ORDER — PRISMASOL BGK 4/2.5 32-4-2.5 MEQ/L IV SOLN
INTRAVENOUS | Status: DC
  Administered 2012-12-31 – 2013-01-07 (×45): via INTRAVENOUS_CENTRAL
  Filled 2012-12-31 (×70): qty 5000

## 2012-12-31 MED ORDER — SODIUM PHOSPHATE 3 MMOLE/ML IV SOLN: 20.0000 mmol | Freq: Once | INTRAVENOUS | Status: DC

## 2012-12-31 MED ORDER — SODIUM GLYCEROPHOSPHATE 1 MMOLE/ML IV SOLN
20.0000 mmol | Freq: Once | INTRAVENOUS | Status: AC
  Administered 2012-12-31: 20 mmol via INTRAVENOUS
  Filled 2012-12-31: qty 20

## 2012-12-31 MED ORDER — PRISMASOL BGK 4/2.5 32-4-2.5 MEQ/L IV SOLN
INTRAVENOUS | Status: DC
  Administered 2012-12-31 – 2013-01-07 (×10): via INTRAVENOUS_CENTRAL
  Filled 2012-12-31 (×15): qty 5000

## 2012-12-31 MED ORDER — SODIUM CHLORIDE 0.9 % IV SOLN: INTRAVENOUS | Status: DC

## 2012-12-31 MED ORDER — PRISMASOL BGK 4/2.5 32-4-2.5 MEQ/L IV SOLN
INTRAVENOUS | Status: DC
  Administered 2012-12-31 – 2013-01-07 (×15): via INTRAVENOUS_CENTRAL
  Filled 2012-12-31 (×27): qty 5000

## 2012-12-31 MED ORDER — DOCUSATE SODIUM 50 MG/5ML PO LIQD
100.0000 mg | Freq: Two times a day (BID) | ORAL | Status: DC
  Administered 2012-12-31 – 2013-01-03 (×7): 100 mg via ORAL
  Filled 2012-12-31 (×8): qty 10

## 2012-12-31 MED ORDER — HEPARIN SODIUM (PORCINE) 5000 UNIT/ML IJ SOLN
5000.0000 [IU] | Freq: Three times a day (TID) | INTRAMUSCULAR | Status: DC
  Administered 2012-12-31 – 2013-01-03 (×8): 5000 [IU] via SUBCUTANEOUS
  Filled 2012-12-31 (×11): qty 1

## 2012-12-31 NOTE — Progress Notes (Signed)
Impella device weaned to off, pulled back to aorta, then removed. ACT was 150. I applied manual pressure for 30 minutes for hemostasis, and Gena Fray will apply another 30 minutes of pressure. Pt stable throughout. Groin level 0.  Tonny Bollman 12/31/2012 9:09 PM

## 2012-12-31 NOTE — Progress Notes (Addendum)
PULMONARY  / CRITICAL CARE MEDICINE  Name: Nicholas Potts MRN: [id number] DOB: June 28, 1960    ADMISSION DATE:  12/29/2012  REFERRING MD :  EDP- Dr. Dan Humphreys PRIMARY SERVICE: PCCM  CHIEF COMPLAINT:  Acute Overdose  BRIEF PATIENT DESCRIPTION: [name] who presented to [facility] ER on 7/24 with AMS related to oxycodone overdose.  Underwent R achilles tendon reconstruction  7/20 with RLE cast in place on admit.  COurse complicated by respiratory arrest initially, then full cardiac arrest. He had episodes of ventricular tachycardia and was defibrillated . He had CPR with return of spontaneous circulation. Emergent cath >> EF 20%, mild non obstructive CAD. Required placement of Impella device for cardiogenic shock    SIGNIFICANT EVENTS / STUDIES:  7/20 - R ankle surgery (? Type, no [facility] Records) ........................................................................................................ 7/24 - Admit to [facility] with ER with acute oxy IR OD 7/25 duplex neg DVT  LINES / TUBES: L Wil[place] TLC 7/24>>> OETT 7/24>>> Aline 7/24>>> Femoral swan ganz 7/24>>> Impella device 7/24>>>  CULTURES: Sputum 7/24>>> BCx2 7/24>>> UC 7/24>>>  ANTIBIOTICS: Vanco 7/24>>>> Zosyn 7/2/4>>>  SUBJECTIVE: Deeply sedated, RASS -4 On PEEP Off pressors, on dobutamine, CI 3.6 On CVVH   VITAL SIGNS: Temp:  [99.7 F (37.6 C)-100.6 F (38.1 C)] 100 F (37.8 C) (07/26 0724) Pulse Rate:  [90-106] 96 (07/26 0724) Resp:  [0-35] 26 (07/26 0724) BP: (101-119)/(51-88) 111/59 mmHg (07/26 0600) SpO2:  [92 %-100 %] 96 % (07/26 0724) Arterial Line BP: (99-144)/(60-93) 118/65 mmHg (07/26 0700) FiO2 (%):  [70 %-80 %] 70 % (07/26 0724) Weight:  [141.4 kg (311 lb 11.7 oz)] 141.4 kg (311 lb 11.7 oz) (07/26 0500)  HEMODYNAMICS: PAP: (33-52)/(14-29) 40/23 mmHg CVP:  [11 mmHg-18 mmHg] 16 mmHg PCWP:  [15 mmHg-20 mmHg] 15 mmHg CO:  [6.3 L/min-9.1 L/min] 9.1 L/min CI:  [2.5 L/min/m2-3.6 L/min/m2] 3.6 L/min/m2  VENTILATOR  SETTINGS: Vent Mode:  [-] PRVC FiO2 (%):  [70 %-80 %] 70 % Set Rate:  [26 bmp-35 bmp] 26 bmp Vt Set:  [550 mL] 550 mL PEEP:  [5 cmH20-14 cmH20] 12 cmH20 Plateau Pressure:  [26 cmH20-33 cmH20] 33 cmH20  INTAKE / OUTPUT: Intake/Output     07/25 0701 - 07/26 0700 07/26 0701 - 07/27 0700   I.V. (mL/kg) 2325.9 (16.4)    Other     NG/GT 1205    IV Piggyback 542    Total Intake(mL/kg) 4072.9 (28.8)    Urine (mL/kg/hr) 217 (0.1)    Total Output 217     Net +3855.9           PHYSICAL EXAMINATION: General:  Chronically ill in NAD on vent Neuro:  sedated on vent, pupils 3mm  HEENT:  OETT, mm pink/moist, short thick neck Cardiovascular:  s1s2 distant, regular Lungs:  resp's even/non-labored, lungs bilaterally coarse, peak pr 45 Abdomen:  Distneded, Round/soft, bsx4 active Musculoskeletal:  No acute deformities, RLE with partial hard cast, good cap refill in LE Skin:  Cool, dry, no edema  LABS:  Recent Labs Lab 12/29/12 1430  12/30/12 0037 12/30/12 0050  12/30/12 0325 12/30/12 0349 12/30/12 0500 12/30/12 1207 12/30/12 1300 12/31/12 0005 12/31/12 0259 12/31/12 0500  HGB 16.3  --   --   --   < >  --   --   --   --  12.8* 12.3*  --  12.4*  WBC 17.8*  --   --   --   < >  --   --   --   --  19.5* 22.4*  --  23.5*  PLT 180  --   --   --   < >  --   --   --   --  130* 122*  --  113*  NA 137  --   --  138  --   --   --   --   --  142 138  --  139  K 6.2*  --   --  5.0  --   --   --   --   --  3.6 4.3  --  4.0  CL 97  --   --  101  --   --   --   --   --  100 99  --  100  CO2 21  --   --  24  --   --   --   --   --  29 28  --  28  GLUCOSE 163*  --   --  269*  --   --   --   --   --  122* 165*  --  156*  BUN 19  --   --  30*  --   --   --   --   --  41* 60*  --  71*  CREATININE 1.76*  --   --  2.51*  --   --   --   --   --  3.35* 4.54*  --  5.07*  CALCIUM 8.4  --   --  7.6*  --   --   --   --   --  7.5* 7.2*  --  7.2*  MG  --   --   --  2.2  --   --   --   --   --   --   --   --   2.0  PHOS  --   --   --  0.6*  --   --   --   --   --   --   --   --  2.0*  AST 703*  --   --  4098*  --   --   --   --   --   --   --   --  6396*  ALT 791*  --   --  4490*  --   --   --   --   --   --   --   --  3909*  ALKPHOS 121*  --   --  91  --   --   --   --   --   --   --   --  105  BILITOT 0.6  --   --  2.1*  --   --   --   --   --   --   --   --  2.2*  PROT 7.1  --   --  4.9*  --   --   --   --   --   --   --   --  4.5*  ALBUMIN 3.2*  --   --  2.3*  --   --   --   --   --   --   --   --  2.1*  INR  --   --   --   --   --  2.76*  --   --   --   --   --   --   --  LATICACIDVEN 9.4*  --   --   --   --   --   --   --   --   --   --   --   --   PROBNP  --   --   --   --   --   --   --  3296.0*  --   --   --   --   --   PHART  --   < > 7.351  --   --   --  7.448  --  7.633*  --   --  7.461*  --   PCO2ART  --   < > 35.7  --   --   --  32.9*  --  29.4*  --   --  38.9  --   PO2ART  --   < > 85.0  --   --   --  99.0  --  74.0*  --   --  81.5  --   < > = values in this interval not displayed.  Recent Labs Lab 12/30/12 1631 12/30/12 1727 12/30/12 1957 12/31/12 0014 12/31/12 0415  GLUCAP 108* 104* 129* 169* 146*   CXR: 7/26  - BL effusions & edema  ASSESSMENT / PLAN:  PULMONARY A: Acute Respiratory Failure - in setting of OD +/- PE ARDS range Hypoxemia  ? Aspiration PNA P:   - Full vent support,on PEEP 14 -ttirate FIO2/PEEP per ARDS protocol - continue heparin for presumed PE & impella - Sedation protocol for goal RASS of -1. - Wean PEEP/FiO2 for sats > 92% as tolerated.  CARDIOVASCULAR A:  Cardiogenic shock - echo EF 35%, Periapical hypokinesis. in setting of acute oxycodone overdose.   P:  - Off Levophed, ct dobutmaine - Per cards- Impella device, CI appears improved 3.6 - swan readings noted -PCWP 15.  RENAL A:   Acute Kidney Injury - secondary to overdose, ? Prolonged down time Hyperkalemia Anion Gap Acidosis - lactic , resolved P:   -CVVH per Renal    GASTROINTESTINAL A:   Morbid Obesity Shock liver P:   - Protonix - ct TF. - colace regimen (on high dose fent) - Follow LFT's  HEMATOLOGIC A:   Macrocytic Anemia - ??ETOH abuse  P:  - Folate/MVI/thiamine  INFECTIOUS A:   Rule out Aspiration PNA P:   - Pan culture - Vanco/zosyn for aspiration - recent admit for R ankle.  Uncertain of length of stay.  If clarified, can narrow for aspiration coverage  ENDOCRINE A:   Concern for Metabolic Syndrome    P:   - SSI - Check A1C -dc stress dose steroids  NEUROLOGIC A:   Acute Encephalopathy - in setting of overdose, known oxy IR +/- other substances, head Ct neg ? ETOH Abuse P:   - Supportive care. - Sedation protocol with versed/fentanyl, goal RASS -1 while on high PEEP & devices in  I have personally obtained a history, examined the patient, evaluated laboratory and imaging results, formulated the assessment and plan and placed orders.  CRITICAL CARE: The patient is critically ill with multiple organ systems failure and requires high complexity decision making for assessment and support, frequent evaluation and titration of therapies, application of advanced monitoring technologies and extensive interpretation of multiple databases. Critical Care Time devoted to patient care services described in this note is 45 minutes.   Cyril Mourning MD. Tonny Bollman.  Pulmonary & Critical care Pager [phone] If no response call  [phone]    12/31/2012, 7:37 AM

## 2012-12-31 NOTE — Progress Notes (Signed)
S:itntubated O:BP 109/64  Pulse 106  Temp(Src) 100.2 F (37.9 C) (Core (Comment))  Resp 21  Ht 6' (1.829 m)  Wt 135.8 kg (299 lb 6.2 oz)  BMI 40.59 kg/m2  SpO2 94%  Intake/Output Summary (Last 24 hours) at 12/31/12 0543 Last data filed at 12/31/12 0500  Gross per 24 hour  Intake 4214.09 ml  Output    222 ml  Net 3992.09 ml   Weight change:  ZOX:WRUEAVWUJ, sedated CVS:RRR Resp:clear ant Abd:+ BS obese, mild distension WJX:BJYNW -1 + edema NEURO:sedated   . antiseptic oral rinse  15 mL Mouth Rinse QID  . chlorhexidine  15 mL Mouth Rinse BID  . feeding supplement  60 mL Oral 5 X Daily  . furosemide  160 mg Intravenous Q8H  . hydrocortisone sodium succinate  50 mg Intravenous Q6H  . impella catheter heparin 50 unit/mL  25,000 Units Intracatheter Q24H  . insulin aspart  1 Units Subcutaneous Q4H  . insulin aspart  1-3 Units Subcutaneous Q4H  . insulin glargine  10 Units Subcutaneous Q24H  . pantoprazole (PROTONIX) IV  40 mg Intravenous QHS  . piperacillin-tazobactam (ZOSYN)  IV  3.375 g Intravenous Q8H  . banana bag IV 1000 mL   Intravenous Once  . sodium chloride  10-40 mL Intracatheter Q12H  . sodium chloride  3 mL Intravenous Q12H  . sodium polystyrene  30 g Per Tube Once   Ct Head Wo Contrast  12/29/2012   *RADIOLOGY REPORT*  Clinical Data: Altered mental status.  Possible overdose.  CT HEAD WITHOUT CONTRAST  Technique:  Contiguous axial images were obtained from the base of the skull through the vertex without contrast.  Comparison: Head CT 12/10/2007.  Findings: There is no evidence of acute intracranial hemorrhage, mass lesion, brain edema or extra-axial fluid collection.  The ventricles and subarachnoid spaces are appropriately sized for age. There is no CT evidence of acute cortical infarction.  There is diffuse mucosal thickening throughout the nasal passages, ethmoid and maxillary sinuses.  The nasopharyngeal airway is closed at the time of imaging (the patient is  intubated).  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  The mastoids and middle ears are clear. The calvarium is intact.  IMPRESSION:  1.  No acute intracranial or calvarial findings identified. 2.  Diffuse mucosal thickening throughout the visualized nasal passages and paranasal sinuses. Significant maxillary periodontal disease bilaterally.   Original Report Authenticated By: Carey Bullocks, M.D.   Dg Chest Port 1 View  12/30/2012   *RADIOLOGY REPORT*  Clinical Data: Catheter placement  PORTABLE CHEST - 1 VIEW  Comparison: Same date.  Findings: No change is noted in position of endotracheal or nasogastric tubes.  Left subclavian catheter is unchanged in position with tip in SVC.  There has been interval placement of right internal jugular dialysis catheter with distal tip in expected position of the SVC.  No pneumothorax is noted. Hypoinflation of the lungs is noted.  Central pulmonary vascular congestion is noted.  IMPRESSION: Status post placement of right internal jugular dialysis catheter with tip in expected position of the SVC.  No pneumothorax is seen.   Original Report Authenticated By: Lupita Raider.,  M.D.   Dg Chest Port 1 View  12/30/2012   *RADIOLOGY REPORT*  Clinical Data: Endotracheal tube placement.  PORTABLE CHEST - 1 VIEW  Comparison: 12/29/2012.  Findings: Low volume chest.  The endotracheal tube tip is 24 mm from the carina.  Defibrillator pads over the chest.  Enteric tube is present  with the tip not visualized.  Inferior presumed femoral approach Swan-Ganz catheter is present with the tip in the descending right pulmonary artery.  Progressive atelectasis/volume loss in the right middle and right lower lobes.  Prominent atelectasis associated with lower lung volumes.  Pulmonary vascular congestion is accentuated by the low volumes. Left subclavian central line remains present with the tip in the mid to lower SVC.  IMPRESSION:  1.  Stable support apparatus aside from  the addition of what appears to be a femoral approach Swan-Ganz catheter with the tip in the descending right pulmonary artery. 2.  Lower lung volumes with increasing atelectasis, most prominent at the right base.   Original Report Authenticated By: Andreas Newport, M.D.   Dg Chest Portable 1 View  12/29/2012   *RADIOLOGY REPORT*  Clinical Data: Unresponsive  PORTABLE CHEST - 1 VIEW  Comparison: Chest radiograph 12/29/2012  Findings: Endotracheal tube is 3.5 cm from carina.  Left central venous line is unchanged.  Stable cardiac silhouette.  There is central venous congestion similar prior.  There is bibasilar atelectasis greater on the right not changed.  IMPRESSION:  1.  Stable support apparatus. 2.  No interval change.  3.  Low lung volumes, basilar atelectasis, and central venous congestion.   Original Report Authenticated By: Genevive Bi, M.D.   Dg Chest Port 1 View  12/29/2012   *RADIOLOGY REPORT*  Clinical Data: Evaluate endotracheal tube and left subclavian line placement  PORTABLE CHEST - 1 VIEW  Comparison: Earlier same day  Findings:  Grossly unchanged enlarged cardiac silhouette and mediastinal contours, possibly attributable to decreased lung volumes and AP projection.  An endotracheal tube overlies the tracheal air column with tip superior to the carina.  Interval placement of a left subclavian vein approach central venous catheter with tip projecting over the superior cavoatrial junction.  Enteric tube tip and side port project below the left hemidiaphragm.  No pneumothorax.  Pulmonary vasculature is indistinct with cephalization of flow.  Small/trace bilateral effusions are suspected with minimal amount of fluid layering within the right minor fissure.  Worsening perihilar opacities.  Unchanged bones.  IMPRESSION: 1.  Appropriately positioned support apparatus as above.  No pneumothorax. 2.  Suspected pulmonary edema. 3.  Reduced lung volumes with worsening perihilar opacities, likely  atelectasis.  findings suggestive of pulmonary edema   Original Report Authenticated By: Tacey Ruiz, MD   Dg Chest Portable 1 View  12/29/2012   *RADIOLOGY REPORT*  Clinical Data: Status post intubation  PORTABLE CHEST - 1 VIEW  Comparison: None.  Findings: An endotracheal tube and nasogastric catheter are noted. Nasogastric catheter appears coiled within the distal esophagus. The endotracheal tube is in satisfactory position.  The lungs are well-aerated with some mild right basilar atelectasis.   IMPRESSION: Tubes and lines as described above.  Mild right basilar atelectasis.   Original Report Authenticated By: Alcide Clever, M.D.   Dg Abd Portable 1v  12/30/2012   *RADIOLOGY REPORT*  Clinical Data: NG tube placement  PORTABLE ABDOMEN - 1 VIEW  Comparison: None.  Findings: The NG tube is curled in the fundus of the stomach.  The tip is in the body.  No disproportionate dilatation of bowel. Multiple linear devices project over the abdomen.  IMPRESSION: NG tube is coiled in the stomach   Original Report Authenticated By: Jolaine Click, M.D.   BMET    Component Value Date/Time   NA 138 12/31/2012 0005   K 4.3 12/31/2012 0005   CL 99 12/31/2012 0005  CO2 28 12/31/2012 0005   GLUCOSE 165* 12/31/2012 0005   BUN 60* 12/31/2012 0005   CREATININE 4.54* 12/31/2012 0005   CALCIUM 7.2* 12/31/2012 0005   GFRNONAA 13* 12/31/2012 0005   GFRAA 16* 12/31/2012 0005   CBC    Component Value Date/Time   WBC 23.5* 12/31/2012 0500   RBC 3.88* 12/31/2012 0500   HGB 12.4* 12/31/2012 0500   HCT 36.8* 12/31/2012 0500   PLT PENDING 12/31/2012 0500   MCV 94.8 12/31/2012 0500   MCH 32.0 12/31/2012 0500   MCHC 33.7 12/31/2012 0500   RDW 14.7 12/31/2012 0500   LYMPHSABS 2.7 12/29/2012 1430   MONOABS 0.9 12/29/2012 1430   EOSABS 0.0 12/29/2012 1430   BASOSABS 0.2* 12/29/2012 1430     Assessment: 1. Acute vs acute on chronic renal failure sec ischemic ATN from hypotension +/- IV contrast.  UO minimal.  Labs pending but anticipate  the need for CVVHD 2. Cardiac arrest 3. HypoPO4  Plan: 1. Start CVVHD.  Pull 50cc/hr for now.   Keishawn Darsey T

## 2012-12-31 NOTE — Progress Notes (Signed)
  Echocardiogram 2D Echocardiogram (limited for Impella repositioning) has been performed.  Jorje Guild 12/31/2012, 2:18 PM

## 2012-12-31 NOTE — Progress Notes (Addendum)
ANTIBIOTIC CONSULT NOTE - FOLLOW UP  Pharmacy Consult for Vancomycin and Zosyn Indication: rule out pneumonia  No Known Allergies  Patient Measurements: Height: 6' (182.9 cm) Weight: 299 lb 6.2 oz (135.8 kg) IBW/kg (Calculated) : 77.6 Adjusted Body Weight: 95.06kg  Vital Signs: Temp: 100.2 F (37.9 C) (07/26 0400) Temp src: Core (Comment) (07/26 0400) BP: 109/64 mmHg (07/26 0400) Pulse Rate: 106 (07/26 0308) Intake/Output from previous day: 07/25 0701 - 07/26 0700 In: 3553.7 [I.V.:2166.7; NG/GT:895; IV Piggyback:492] Out: 212 [Urine:212] Intake/Output from this shift: Total I/O In: 1637 [I.V.:826; NG/GT:695; IV Piggyback:116] Out: 62 [Urine:62]  Labs:  Recent Labs  12/30/12 1300 12/31/12 0005 12/31/12 0500  WBC 19.5* 22.4* 23.5*  HGB 12.8* 12.3* 12.4*  PLT 130* 122* PENDING  CREATININE 3.35* 4.54* 5.07*   Estimated Creatinine Clearance: 24 ml/min (by C-G formula based on Cr of 5.07).  Recent Labs  12/30/12 1155 12/31/12 0500  VANCORANDOM 22.7 17.8    Microbiology: Recent Results (from the past 720 hour(s))  URINE CULTURE     Status: None   Collection Time    12/29/12  3:08 PM      Result Value Range Status   Specimen Description URINE, CATHETERIZED   Final   Special Requests NONE   Final   Culture  Setup Time 12/29/2012 23:00   Final   Colony Count NO GROWTH   Final   Culture NO GROWTH   Final   Report Status 12/30/2012 FINAL   Final  CULTURE, BLOOD (ROUTINE X 2)     Status: None   Collection Time    12/29/12  3:27 PM      Result Value Range Status   Specimen Description BLOOD HAND LEFT   Final   Special Requests BOTTLES DRAWN AEROBIC ONLY 5CC   Final   Culture  Setup Time 12/29/2012 22:48   Final   Culture     Final   Value:        BLOOD CULTURE RECEIVED NO GROWTH TO DATE CULTURE WILL BE HELD FOR 5 DAYS BEFORE ISSUING A FINAL NEGATIVE REPORT   Report Status PENDING   Incomplete  CULTURE, BLOOD (ROUTINE X 2)     Status: None   Collection Time   12/29/12  3:37 PM      Result Value Range Status   Specimen Description BLOOD ARM LEFT   Final   Special Requests BOTTLES DRAWN AEROBIC ONLY 2CC   Final   Culture  Setup Time 12/29/2012 22:48   Final   Culture     Final   Value:        BLOOD CULTURE RECEIVED NO GROWTH TO DATE CULTURE WILL BE HELD FOR 5 DAYS BEFORE ISSUING A FINAL NEGATIVE REPORT   Report Status PENDING   Incomplete  URINE CULTURE     Status: None   Collection Time    12/29/12  7:11 PM      Result Value Range Status   Specimen Description URINE, CATHETERIZED   Final   Special Requests NONE   Final   Culture  Setup Time 12/29/2012 21:09   Final   Colony Count NO GROWTH   Final   Culture NO GROWTH   Final   Report Status 12/31/2012 FINAL   Final  MRSA PCR SCREENING     Status: None   Collection Time    12/30/12  2:24 AM      Result Value Range Status   MRSA by PCR NEGATIVE  NEGATIVE Final  Comment:            The GeneXpert MRSA Assay (FDA     approved for NASAL specimens     only), is one component of a     comprehensive MRSA colonization     surveillance program. It is not     intended to diagnose MRSA     infection nor to guide or     monitor treatment for     MRSA infections.    Anti-infectives   Start     Dose/Rate Route Frequency Ordered Stop   12/30/12 1000  vancomycin (VANCOCIN) 1,250 mg in sodium chloride 0.9 % 250 mL IVPB  Status:  Discontinued     1,250 mg 166.7 mL/hr over 90 Minutes Intravenous Every 12 hours 12/29/12 1625 12/30/12 0959   12/29/12 2200  piperacillin-tazobactam (ZOSYN) IVPB 3.375 g     3.375 g 12.5 mL/hr over 240 Minutes Intravenous Every 8 hours 12/29/12 1834     12/29/12 2200  vancomycin (VANCOCIN) 2,000 mg in sodium chloride 0.9 % 500 mL IVPB     2,000 mg 250 mL/hr over 120 Minutes Intravenous  Once 12/29/12 2148 12/30/12 0236   12/29/12 1545  [MAR Hold]  vancomycin (VANCOCIN) 2,000 mg in sodium chloride 0.9 % 500 mL IVPB  Status:  Discontinued     (On MAR Hold since 12/29/12  1946)   2,000 mg 250 mL/hr over 120 Minutes Intravenous To Emergency Dept 12/29/12 1528 12/29/12 2148   12/29/12 1530  [MAR Hold]  piperacillin-tazobactam (ZOSYN) IVPB 3.375 g  Status:  Discontinued     (On MAR Hold since 12/29/12 1946)   3.375 g 100 mL/hr over 30 Minutes Intravenous  Once 12/29/12 1519 12/29/12 2149   12/29/12 1515  piperacillin-tazobactam (ZOSYN) IVPB 4.5 g  Status:  Discontinued     4.5 g 200 mL/hr over 30 Minutes Intravenous  Once 12/29/12 1507 12/29/12 1517     Assessment: 51yom continuing on Vancomycin and Zosyn for possible PNA (aspiration?).Patient's SCr has continued to increase (1.76-->3.35>>5.07) and UOP is minimal and continues to decrease.  He is very positive with I&O's last 24-48 hr.  His repeat Vancomycin level is 17.89mcg/ml which indicates inadequate clearance.  We will continue to hold further dosing until his renal function improves and his random Vancomycin level is < 15 mcg/ml.  Will repeat random Vancomycin level with AM labs.   Goal of Therapy:  Vancomycin trough level 15-20 mcg/ml  Plan:  1. No additional Vancomycin today 2. Random Vancomycin level with AM labs tomorrow 3. Continue Zosyn 3.375g IV q8h 4. Monitor renal function, cultures, clinical course and adjust as indicated  Nadara Mustard, PharmD., MS Clinical Pharmacist Pager:  [phone] Thank you for allowing pharmacy to be part of this patients care team. 12/31/2012,5:52 AM   Addendum: Patient has been started on CVVHD which does filter antibiotics - will adjust Vancomycin and Zosyn doses.  Plan: 1. Vancomycin 1250mg  IV q24h 2. Change Zosyn to 2.25g IV q6h 3. Monitor CVVHD plans, cultures, clinical course and adjust as indicated  Wilfred Lacy, PharmD Clinical Pharmacist [phone] 12/31/2012, 8:18 AM

## 2012-12-31 NOTE — Progress Notes (Signed)
Spoke with Dr. Excell Seltzer, heparin off, no plans to re-start, low suspicion for PE

## 2012-12-31 NOTE — Progress Notes (Signed)
Dr Arta Silence notified w/ no orders for heparin in the CRRT for now-will evaluate in am as ordered.

## 2012-12-31 NOTE — Progress Notes (Signed)
Impella Repositioning Note:  Notified that motor current changed on Impella console and catheter position error noted. Repositioned catheter but unable to obtain good motor current and output. Echo called and catheter was repositioned under echo guidance. Long attempt made to find appropriate position but echo windows are extremely poor. Catheter output is clearly on aortic side with mosaic pattern identified on color doppler. Flow down to 2.6 but unable to find better position. Catheter at 86 cm. Will obtain CXR to further identify position.  Tonny Bollman 12/31/2012 11:21 AM

## 2012-12-31 NOTE — Progress Notes (Signed)
Notified Dr. Excell Seltzer that Impella is alarming not in position and that it has been weaned down to P-2. Heparin has been placed on hold and Impella flow rate is maintaining 2.0 L/min with no current changes in patients hemodynamics. Night shift nurse informed of plan to discontinue Impella around 2000 and that if heparin is not restarted to contact renal physician for heparin orders with CRRT.

## 2012-12-31 NOTE — Progress Notes (Signed)
eLink Physician-Brief Progress Note Patient Name: Nicholas Potts DOB: [birth year] MRN: [id number]  Date of Service  12/31/2012   HPI/Events of Note   Hypophosphatemia   eICU Interventions  Phos replaced   Intervention Category Minor Interventions: Electrolytes abnormality - evaluation and management  DETERDING,ELIZABETH 12/31/2012, 6:04 AM

## 2012-12-31 NOTE — Progress Notes (Signed)
RN noted Flow rate for Impella decreased from 3.2L/min to 2.9 L/min. Clinical specialist was notified and discussed situation. Pt is hemodynamically stable with no changes to BP nor CVP. MD was paged and notified. MD to bedside to check placement of Impella. Bedside echo performed,  Impella was advanced from 84 to 85. Motor current waveform and placement signal excellent, flow rate still decreased to 2.4-2.5. MD ok with placement and flow rate, MD states if pt changes hemodynamically to notify.

## 2012-12-31 NOTE — Progress Notes (Signed)
eLink Physician-Brief Progress Note Patient Name: Nicholas Potts DOB: 05/17/1961 MRN: [id number]  Date of Service  12/31/2012   HPI/Events of Note  Patient with AoCRF now on CRRT with goal to remove 50 cc/hr.  Has scheduled lasix which had been ordered on 7.25 to stimulate UOP with no success.   eICU Interventions  Plan: Since on CRRT with volume removal will d/c lasix for now.   Intervention Category Intermediate Interventions: Oliguria - evaluation and management Minor Interventions: Routine modifications to care plan (e.g. PRN medications for pain, fever)  Bentley Haralson 12/31/2012, 11:48 PM

## 2012-12-31 NOTE — Progress Notes (Signed)
Manual pressure applied for another 30 minutes in the left femoral w/ 1+ post pedal pulse.site level 1-pressure dressing applied.

## 2012-12-31 NOTE — Progress Notes (Signed)
Subjective:  Patient remains intubated, sedated. Impella device in LV. EF 20%.  Nonobstructive CAD. Hx oxycodone overdose, respiratory and cardiac arrest, ventricular tachycardia. Acute renal failure on CVVH. Rhythm NSR.  Objective:  Vital Signs in the last 24 hours: Temp:  [99.7 F (37.6 C)-100.6 F (38.1 C)] 100 F (37.8 C) (07/26 0724) Pulse Rate:  [90-106] 96 (07/26 0724) Resp:  [0-35] 26 (07/26 0724) BP: (101-119)/(51-88) 111/59 mmHg (07/26 0600) SpO2:  [92 %-97 %] 96 % (07/26 0724) Arterial Line BP: (99-137)/(60-88) 118/65 mmHg (07/26 0700) FiO2 (%):  [60 %-80 %] 60 % (07/26 0757) Weight:  [311 lb 11.7 oz (141.4 kg)] 311 lb 11.7 oz (141.4 kg) (07/26 0500)  Intake/Output from previous day: 07/25 0701 - 07/26 0700 In: 4072.9 [I.V.:2325.9; NG/GT:1205; IV Piggyback:542] Out: 217 [Urine:217] Intake/Output from this shift: Total I/O In: -  Out: 19 [Other:19]  . antiseptic oral rinse  15 mL Mouth Rinse QID  . chlorhexidine  15 mL Mouth Rinse BID  . docusate  100 mg Oral BID  . feeding supplement  60 mL Oral 5 X Daily  . furosemide  160 mg Intravenous Q8H  . impella catheter heparin 50 unit/mL  25,000 Units Intracatheter Q24H  . insulin aspart  1 Units Subcutaneous Q4H  . insulin aspart  1-3 Units Subcutaneous Q4H  . insulin glargine  10 Units Subcutaneous Q24H  . pantoprazole (PROTONIX) IV  40 mg Intravenous QHS  . piperacillin-tazobactam (ZOSYN)  IV  2.25 g Intravenous Q6H  . banana bag IV 1000 mL   Intravenous Once  . sodium chloride  10-40 mL Intracatheter Q12H  . sodium chloride  3 mL Intravenous Q12H  . sodium glycerophosphate 0.9% NaCl IVPB  20 mmol Intravenous Once  . sodium polystyrene  30 g Per Tube Once  . vancomycin  1,250 mg Intravenous Q24H   . sodium chloride 20 mL/hr at 12/30/12 1900  . amiodarone (NEXTERONE) IV bolus only 150 mg/100 mL    . dextrose    . DOBUTamine 2.5 mcg/kg/min (12/30/12 0348)  . feeding supplement (PROMOTE) 1,000 mL (12/30/12  1300)  . fentaNYL infusion INTRAVENOUS 300 mcg/hr (12/30/12 2204)  . heparin 500 Units/hr (12/30/12 0101)  . midazolam (VERSED) infusion 5 mg/hr (12/31/12 0247)  . norepinephrine (LEVOPHED) Adult infusion Stopped (12/30/12 0030)  . phenylephrine (NEO-SYNEPHRINE) Adult infusion 100 mcg/min (12/29/12 1808)  . dialysis replacement fluid (prismasate) 500 mL/hr at 12/31/12 0750  . dialysis replacement fluid (prismasate) 300 mL/hr at 12/31/12 0750  . dialysate (PRISMASATE) 1,500 mL/hr at 12/31/12 0750    Physical Exam: The patient is sedated on vent.   Chest good breath sounds bilaterally.  Heart sounds are very distant. No murmur gallop or rub.  The abdomen soft. Decreased bowel sounds.  Extremities reveal no phlebitis or edema.  Pedal pulses are good.  There is no cyanosis or clubbing.  Neurologic exam not testable.  Integument reveals no rash  Lab Results:  Recent Labs  12/31/12 0005 12/31/12 0500  WBC 22.4* 23.5*  HGB 12.3* 12.4*  PLT 122* 113*    Recent Labs  12/31/12 0005 12/31/12 0500  NA 138 139  K 4.3 4.0  CL 99 100  CO2 28 28  GLUCOSE 165* 156*  BUN 60* 71*  CREATININE 4.54* 5.07*   No results found for this basename: TROPONINI, CK, MB,  in the last 72 hours Hepatic Function Panel  Recent Labs  12/31/12 0500  PROT 4.5*  ALBUMIN 2.1*  AST 6396*  ALT 3909*  ALKPHOS 105  BILITOT 2.2*  BILIDIR 1.3*  IBILI 0.9   No results found for this basename: CHOL,  in the last 72 hours No results found for this basename: PROTIME,  in the last 72 hours  Imaging: Imaging results have been reviewed  Cardiac Studies: Telemetry shows NSR. PAWP 19 C.I. 2.98 Assessment/Plan:  1. Cardiogenic shock.  Nonobstructive CAD by cath. Impella device in LV. 2. Oxycodone overdose, S/P respiratory and cardiac arrest. 3. Acute renal failure. 4. Acute respiratory failure.  Plan: Continue hemodynamic support with Impella device.           Check troponin, check followup  EKG.   LOS: 2 days    Cassell Clement 12/31/2012, 8:36 AM

## 2012-12-31 NOTE — Progress Notes (Signed)
Pt evaluated. Notes of Dr Patty Sermons and Dr Vassie Loll reviewed. Impella data and invasive cardiac hemodynamics reviewed and look good. Pt has started CVVHD with goal of 50 cc/hr negative fluid balance. Plan continue Impella support today and d/c tomorrow am. Will write to begin to wean the Impella at midnight tonight.  Tonny Bollman 12/31/2012 9:44 AM

## 2013-01-01 ENCOUNTER — Inpatient Hospital Stay (HOSPITAL_COMMUNITY): Payer: BC Managed Care – PPO

## 2013-01-01 LAB — CBC
Hemoglobin: 10.4 g/dL — ABNORMAL LOW (ref 13.0–17.0)
MCH: 32.2 pg (ref 26.0–34.0)
MCH: 31.8 pg (ref 26.0–34.0)
MCHC: 33 g/dL (ref 30.0–36.0)
MCV: 97.2 fL (ref 78.0–100.0)
Platelets: 94 10*3/uL — ABNORMAL LOW (ref 150–400)
Platelets: 74 10*3/uL — ABNORMAL LOW (ref 150–400)
RBC: 3.51 MIL/uL — ABNORMAL LOW (ref 4.22–5.81)
RBC: 3.27 MIL/uL — ABNORMAL LOW (ref 4.22–5.81)
RDW: 15.3 % (ref 11.5–15.5)
WBC: 24.3 10*3/uL — ABNORMAL HIGH (ref 4.0–10.5)

## 2013-01-01 LAB — GLUCOSE, CAPILLARY
Glucose-Capillary: 100 mg/dL — ABNORMAL HIGH (ref 70–99)
Glucose-Capillary: 114 mg/dL — ABNORMAL HIGH (ref 70–99)
Glucose-Capillary: 122 mg/dL — ABNORMAL HIGH (ref 70–99)
Glucose-Capillary: 127 mg/dL — ABNORMAL HIGH (ref 70–99)

## 2013-01-01 LAB — POCT I-STAT 3, ART BLOOD GAS (G3+)
Bicarbonate: 24.1 mEq/L — ABNORMAL HIGH (ref 20.0–24.0)
TCO2: 25 mmol/L (ref 0–100)
pCO2 arterial: 40.9 mmHg (ref 35.0–45.0)
pH, Arterial: 7.376 (ref 7.350–7.450)

## 2013-01-01 LAB — RENAL FUNCTION PANEL
Albumin: 2.1 g/dL — ABNORMAL LOW (ref 3.5–5.2)
BUN: 58 mg/dL — ABNORMAL HIGH (ref 6–23)
CO2: 24 mEq/L (ref 19–32)
CO2: 25 mEq/L (ref 19–32)
Calcium: 7.9 mg/dL — ABNORMAL LOW (ref 8.4–10.5)
Calcium: 8 mg/dL — ABNORMAL LOW (ref 8.4–10.5)
Creatinine, Ser: 4.03 mg/dL — ABNORMAL HIGH (ref 0.50–1.35)
GFR calc Af Amer: 17 mL/min — ABNORMAL LOW (ref >90)
GFR calc non Af Amer: 15 mL/min — ABNORMAL LOW (ref >90)
Glucose, Bld: 146 mg/dL — ABNORMAL HIGH (ref 70–99)
Phosphorus: 4 mg/dL (ref 2.3–4.6)
Sodium: 137 mEq/L (ref 135–145)

## 2013-01-01 MED ORDER — BISACODYL 5 MG PO TBEC
10.0000 mg | DELAYED_RELEASE_TABLET | Freq: Once | ORAL | Status: AC
  Administered 2013-01-01: 10 mg via ORAL
  Filled 2013-01-01: qty 2

## 2013-01-01 MED ORDER — PANTOPRAZOLE SODIUM 40 MG PO PACK
40.0000 mg | PACK | Freq: Every day | ORAL | Status: DC
  Administered 2013-01-01 – 2013-01-07 (×7): 40 mg
  Filled 2013-01-01 (×8): qty 20

## 2013-01-01 MED ORDER — NOREPINEPHRINE BITARTRATE 1 MG/ML IJ SOLN
2.0000 ug/min | INTRAVENOUS | Status: DC
  Administered 2013-01-01: 20 ug/min via INTRAVENOUS
  Filled 2013-01-01 (×2): qty 16

## 2013-01-01 NOTE — Progress Notes (Signed)
SVR in the 400 range Dr Excell Seltzer made aware.

## 2013-01-01 NOTE — Progress Notes (Signed)
Nicholas Potts and cortis d/c'd (Lt groin)per Dr [employer] telephone orders . Pressure held to site x 20 min .VSS .

## 2013-01-01 NOTE — Progress Notes (Signed)
CVVHD management.  Still on levophed and dobutamine.  Lytes OK.  Trying fo net 50cc/hr. CVP 13  Not on heparin.  Pt reportedly a Jehovah's Witness.  Nurse says filters doing OK without need for anticoagulation.

## 2013-01-01 NOTE — Progress Notes (Signed)
PULMONARY  / CRITICAL CARE MEDICINE  Name: Nicholas Potts MRN: [id number] DOB: 12-05-1960    ADMISSION DATE:  12/29/2012  REFERRING MD :  EDP- Dr. Dan Humphreys PRIMARY SERVICE: PCCM  CHIEF COMPLAINT:  Acute Overdose  BRIEF PATIENT DESCRIPTION: [name] who presented to [facility] ER on 7/24 with AMS related to oxycodone overdose.  Underwent R achilles tendon reconstruction 7/20 with RLE cast in place on admit.  COurse complicated by respiratory arrest initially, then full cardiac arrest. He had episodes of ventricular tachycardia and was defibrillated . He had CPR with return of spontaneous circulation. Emergent cath >> EF 20%, mild non obstructive CAD. Required placement of Impella device for cardiogenic shock    SIGNIFICANT EVENTS / STUDIES:  7/20 - R ankle surgery (? Type, no MC Records) ........................................................................................................ 7/24 - Admit to [facility] with ER with acute oxy IR OD 7/25 duplex neg DVT  LINES / TUBES: L [place] TLC 7/24>>> OETT 7/24>>> Aline 7/24>>> Femoral swan ganz 7/24>>> Impella device 7/24>>>7/26  CULTURES: Sputum 7/24>>> BCx2 7/24>>>ng UC 7/24>>>ng  ANTIBIOTICS: Vanco 7/24>>>> Zosyn 7/2/4>>>  SUBJECTIVE: Deeply sedated, RASS -3 On PEEP 10 ON levophed, on dobutamine, CI 3.6 On CVVH -50   VITAL SIGNS: Temp:  [97 F (36.1 C)-99.7 F (37.6 C)] 97.5 F (36.4 C) (07/27 0700) Pulse Rate:  [82-102] 95 (07/27 0318) Resp:  [0-26] 24 (07/27 0700) BP: (89-140)/(46-67) 100/52 mmHg (07/27 0318) SpO2:  [88 %-100 %] 96 % (07/27 0700) Arterial Line BP: (84-145)/(45-72) 111/54 mmHg (07/27 0700) FiO2 (%):  [50 %-70 %] 70 % (07/27 0700) Weight:  [141 kg (310 lb 13.6 oz)] 141 kg (310 lb 13.6 oz) (07/27 0441)  HEMODYNAMICS: PAP: (38-79)/(22-40) 50/27 mmHg CVP:  [13 mmHg-22 mmHg] 18 mmHg PCWP:  [14 mmHg-21 mmHg] 15 mmHg CO:  [6.9 L/min-10 L/min] 9.6 L/min CI:  [2.7 L/min/m2-4 L/min/m2] 3.8  L/min/m2  VENTILATOR SETTINGS: Vent Mode:  [-] PRVC FiO2 (%):  [50 %-70 %] 70 % Set Rate:  [24 bmp-26 bmp] 24 bmp Vt Set:  [550 mL] 550 mL PEEP:  [10 cmH20] 10 cmH20 Plateau Pressure:  [25 cmH20-33 cmH20] 25 cmH20  INTAKE / OUTPUT: Intake/Output     07/26 0701 - 07/27 0700 07/27 0701 - 07/28 0700   I.V. (mL/kg) 2131.9 (15.1)    NG/GT 1370    IV Piggyback 825    Total Intake(mL/kg) 4326.9 (30.7)    Urine (mL/kg/hr) 25 (0)    Other 4872 (1.4)    Total Output 4897     Net -570.1           PHYSICAL EXAMINATION: General: acutely ill  on vent Neuro:  sedated on vent, pupils 3mm RTL, withdraws to pain, RASS -3  HEENT:  OETT, mm pink/moist, short thick neck Cardiovascular:  s1s2 distant, regular Lungs:  resp's even/non-labored, lungs bilaterally coarse, peak pr 37 Abdomen:  Distended, Round/soft, bsx4 active Musculoskeletal:  No acute deformities, RLE with partial hard cast, good cap refill in LE Skin:  Cool, dry, no edema  LABS:  Recent Labs Lab 12/29/12 1430  12/30/12 0037  12/30/12 0050  12/30/12 0325 12/30/12 0349 12/30/12 0500 12/30/12 1207  12/31/12 0259 12/31/12 0500 12/31/12 0942 12/31/12 1032 12/31/12 1245 12/31/12 1430 12/31/12 1606 01/01/13 0433 01/01/13 0500  HGB 16.3  --   --   --   --   < >  --   --   --   --   < >  --  12.4*  --   --  11.0*  --   --  11.3*  --   WBC 17.8*  --   --   --   --   < >  --   --   --   --   < >  --  23.5*  --   --  24.3*  --   --  27.6*  --   PLT 180  --   --   --   --   < >  --   --   --   --   < >  --  113*  --   --  107*  --   --  94*  --   NA 137  --   --   --  138  --   --   --   --   --   < >  --  139  --   --  140  --  138  --  137  K 6.2*  --   --   --  5.0  --   --   --   --   --   < >  --  4.0  --   --  4.1  --  4.0  --  4.4  CL 97  --   --   --  101  --   --   --   --   --   < >  --  100  --   --  103  --  102  --  102  CO2 21  --   --   --  24  --   --   --   --   --   < >  --  28  --   --  26  --  27  --  24   GLUCOSE 163*  --   --   --  269*  --   --   --   --   --   < >  --  156*  --   --  183*  --  171*  --  123*  BUN 19  --   --   --  30*  --   --   --   --   --   < >  --  71*  --   --  72*  --  67*  --  59*  CREATININE 1.76*  --   --   --  2.51*  --   --   --   --   --   < >  --  5.07*  --   --  5.08*  --  4.57*  --  4.14*  CALCIUM 8.4  --   --   --  7.6*  --   --   --   --   --   < >  --  7.2*  --   --  7.1*  --  7.4*  --  7.9*  MG  --   --   --   --  2.2  --   --   --   --   --   --   --  2.0  --   --   --   --   --   --   --   PHOS  --   --   --   < > 0.6*  --   --   --   --   --   --   --  2.0*  --   --   --   --  3.7  --  4.0  AST 703*  --   --   --  [place]u*  --   --   --   --   --   --   --  6396*  --   --   --   --   --   --   --   ALT 791*  --   --   --  4490*  --   --   --   --   --   --   --  3909*  --   --   --   --   --   --   --   ALKPHOS 121*  --   --   --  91  --   --   --   --   --   --   --  105  --   --   --   --   --   --   --   BILITOT 0.6  --   --   --  2.1*  --   --   --   --   --   --   --  2.2*  --   --   --   --   --   --   --   PROT 7.1  --   --   --  4.9*  --   --   --   --   --   --   --  4.5*  --   --   --   --   --   --   --   ALBUMIN 3.2*  --   --   --  2.3*  --   --   --   --   --   --   --  2.1*  --   --   --   --  2.0*  --  2.1*  INR  --   --   --   --   --   --  2.76*  --   --   --   --   --   --   --   --   --   --   --   --   --   LATICACIDVEN 9.4*  --   --   --   --   --   --   --   --   --   --   --   --   --   --   --   --   --   --   --   TROPONINI  --   --   --   --   --   --   --   --   --   --   --   --   --   --  0.88*  --  0.66*  --   --   --   PROBNP  --   --   --   --   --   --   --   --  3296.0*  --   --   --   --   --   --   --   --   --   --   --   PHART  --   < > 7.351  --   --   --   --  7.448  --  7.633*  --  7.461*  --  7.486*  --   --   --   --   --   --   PCO2ART  --   < > 35.7  --   --   --   --  32.9*  --  29.4*  --  38.9  --  34.4*  --   --    --   --   --   --   PO2ART  --   < > 85.0  --   --   --   --  99.0  --  74.0*  --  81.5  --  67.0*  --   --   --   --   --   --   < > = values in this interval not displayed.  Recent Labs Lab 12/31/12 1223 12/31/12 1549 12/31/12 2001 12/31/12 2331 01/01/13 0430  GLUCAP 145* 156* 109* 101* 114*   CXR: 7/26  - BL effusions & edema  ASSESSMENT / PLAN:  PULMONARY A: Acute Respiratory Failure - in setting of acute oxycodone overdose.  ARDS -low PCWP ? Aspiration PNA P:   - Full vent support,on PEEP 10 -ttirate FIO2/PEEP per ARDS protocol - Sedation protocol for goal RASS of -1. - Wean PEEP/FiO2 for sats > 92% as tolerated, rpt ABG/ CXR  CARDIOVASCULAR A:  Cardiogenic shock - echo EF 35%, Periapical hypokinesis. Post VT arrest P:  - ct Levophed, taper dobutamine -- swan readings noted,  -  Impella device out, CI appears improved 3.6 -Off heparin   RENAL A:   Acute Kidney Injury - secondary to overdose, ? Prolonged down time Hyperkalemia Anion Gap Acidosis - lactic , resolved P:   -CVVH per Renal , would keep even while on pressors  GASTROINTESTINAL A:   Morbid Obesity Shock liver P:   - Protonix - ct TF. - colace regimen (on high dose fent), add dulcolax - Follow LFT's  HEMATOLOGIC A:   Macrocytic Anemia - ??ETOH abuse  P:  - Folate/MVI/thiamine  INFECTIOUS A:   Rule out Aspiration PNA P:   - Pan culture - Vanco/zosyn for aspiration  -  If cx neg, can narrow for aspiration coverage  ENDOCRINE A:   Concern for Metabolic Syndrome  , HbA1c 5.6  P:   - SSI -dc stress dose steroids  NEUROLOGIC A:   Acute Encephalopathy - in setting of overdose, known oxy IR +/- other substances, head Ct neg ? ETOH Abuse P:   - Supportive care. - Sedation protocol with versed/fentanyl, goal RASS -1 while on high PEEP & devices in  Updated daughter at bedside  I have personally obtained a history, examined the patient, evaluated laboratory and imaging results,  formulated the assessment and plan and placed orders.  CRITICAL CARE: The patient is critically ill with multiple organ systems failure and requires high complexity decision making for assessment and support, frequent evaluation and titration of therapies, application of advanced monitoring technologies and extensive interpretation of multiple databases. Critical Care Time devoted to patient care services described in this note is 45 minutes.   Cyril Mourning MD. Tonny Bollman. [facility] Pager [phone] If no response call [phone]    01/01/2013, 7:28 AM

## 2013-01-01 NOTE — Progress Notes (Signed)
Subjective:  Patient remains intubated, sedated. Impella device was removed last night by Dr. Excell Seltzer. EF 20%.  Nonobstructive CAD. Hx oxycodone overdose, respiratory and cardiac arrest, ventricular tachycardia. Acute renal failure on CVVH. Rhythm remains NSR.  Objective:  Vital Signs in the last 24 hours: Temp:  [97 F (36.1 C)-98.2 F (36.8 C)] 98.1 F (36.7 C) (07/27 0800) Pulse Rate:  [82-102] 92 (07/27 0736) Resp:  [0-26] 24 (07/27 1030) BP: (89-140)/(46-65) 108/51 mmHg (07/27 0736) SpO2:  [88 %-100 %] 96 % (07/27 1030) Arterial Line BP: (84-145)/(45-68) 119/58 mmHg (07/27 1030) FiO2 (%):  [50 %-70 %] 70 % (07/27 1000) Weight:  [310 lb 13.6 oz (141 kg)] 310 lb 13.6 oz (141 kg) (07/27 0441)  Intake/Output from previous day: 07/26 0701 - 07/27 0700 In: 4326.9 [I.V.:2131.9; NG/GT:1370; IV Piggyback:825] Out: 4897 [Urine:25] Intake/Output from this shift: Total I/O In: 790.3 [I.V.:155.3; Other:210; NG/GT:160; IV Piggyback:265] Out: 1146 [Other:1146]  . antiseptic oral rinse  15 mL Mouth Rinse QID  . chlorhexidine  15 mL Mouth Rinse BID  . docusate  100 mg Oral BID  . feeding supplement  60 mL Oral 5 X Daily  . heparin subcutaneous  5,000 Units Subcutaneous Q8H  . insulin aspart  1 Units Subcutaneous Q4H  . insulin aspart  1-3 Units Subcutaneous Q4H  . insulin glargine  10 Units Subcutaneous Q24H  . pantoprazole sodium  40 mg Per Tube Q1200  . piperacillin-tazobactam (ZOSYN)  IV  2.25 g Intravenous Q6H  . banana bag IV 1000 mL   Intravenous Once  . sodium chloride  10-40 mL Intracatheter Q12H  . sodium chloride  3 mL Intravenous Q12H  . sodium polystyrene  30 g Per Tube Once  . vancomycin  1,250 mg Intravenous Q24H   . sodium chloride 20 mL/hr at 12/31/12 1017  . sodium chloride 10 mL/hr at 12/31/12 0700  . amiodarone (NEXTERONE) IV bolus only 150 mg/100 mL    . dextrose    . DOBUTamine 2.5 mcg/kg/min (12/31/12 2340)  . feeding supplement (PROMOTE) 1,000 mL  (12/31/12 0700)  . fentaNYL infusion INTRAVENOUS 160 mcg/hr (01/01/13 0700)  . midazolam (VERSED) infusion 3 mg/hr (01/01/13 0600)  . norepinephrine (LEVOPHED) Adult infusion 10.027 mcg/min (01/01/13 1020)  . phenylephrine (NEO-SYNEPHRINE) Adult infusion 100 mcg/min (12/29/12 1808)  . dialysis replacement fluid (prismasate) 500 mL/hr at 01/01/13 0524  . dialysis replacement fluid (prismasate) 300 mL/hr at 01/01/13 0146  . dialysate (PRISMASATE) 1,500 mL/hr at 01/01/13 1610    Physical Exam: The patient is sedated on vent.   Chest good breath sounds anteriorly bilaterally.  Heart sounds are very distant. No murmur gallop or rub.  The abdomen soft. Decreased bowel sounds.  Extremities reveal no phlebitis or edema.  Pedal pulses are good.  There is no cyanosis or clubbing.  Neurologic exam not testable.  Integument reveals no rash  Lab Results:  Recent Labs  12/31/12 1245 01/01/13 0433  WBC 24.3* 27.6*  HGB 11.0* 11.3*  PLT 107* 94*    Recent Labs  12/31/12 1606 01/01/13 0500  NA 138 137  K 4.0 4.4  CL 102 102  CO2 27 24  GLUCOSE 171* 123*  BUN 67* 59*  CREATININE 4.57* 4.14*    Recent Labs  12/31/12 1032 12/31/12 1430  TROPONINI 0.88* 0.66*   Hepatic Function Panel  Recent Labs  12/31/12 0500  01/01/13 0500  PROT 4.5*  --   --   ALBUMIN 2.1*  < > 2.1*  AST 6396*  --   --  ALT 3909*  --   --   ALKPHOS 105  --   --   BILITOT 2.2*  --   --   BILIDIR 1.3*  --   --   IBILI 0.9  --   --   < > = values in this interval not displayed. No results found for this basename: CHOL,  in the last 72 hours No results found for this basename: PROTIME,  in the last 72 hours  Imaging: Imaging results have been reviewed  Cardiac Studies: Telemetry shows NSR. EKG today shows no ischemia, no MI. Low voltage is present. Assessment/Plan:  1. Cardiogenic shock.  Nonobstructive CAD by cath. Impella device is out. Presently BP stable on dolbutamine and levophed 2.  Oxycodone overdose, S/P respiratory and cardiac arrest. 3. Acute renal failure. 4. Acute respiratory failure.  Plan: Continue hemodynamic support.            LOS: 3 days    Cassell Clement 01/01/2013, 11:11 AM

## 2013-01-02 ENCOUNTER — Inpatient Hospital Stay (HOSPITAL_COMMUNITY): Payer: BC Managed Care – PPO

## 2013-01-02 ENCOUNTER — Encounter (HOSPITAL_COMMUNITY): Payer: Self-pay | Admitting: *Deleted

## 2013-01-02 LAB — RENAL FUNCTION PANEL
Albumin: 2 g/dL — ABNORMAL LOW (ref 3.5–5.2)
BUN: 59 mg/dL — ABNORMAL HIGH (ref 6–23)
CO2: 27 meq/L (ref 19–32)
CO2: 25 mEq/L (ref 19–32)
Calcium: 7.9 mg/dL — ABNORMAL LOW (ref 8.4–10.5)
Chloride: 101 meq/L (ref 96–112)
Chloride: 101 mEq/L (ref 96–112)
Creatinine, Ser: 3.74 mg/dL — ABNORMAL HIGH (ref 0.50–1.35)
Creatinine, Ser: 3.53 mg/dL — ABNORMAL HIGH (ref 0.50–1.35)
GFR calc Af Amer: 20 mL/min — ABNORMAL LOW
GFR calc non Af Amer: 17 mL/min — ABNORMAL LOW
GFR calc non Af Amer: 18 mL/min — ABNORMAL LOW (ref >90)
Glucose, Bld: 128 mg/dL — ABNORMAL HIGH (ref 70–99)
Phosphorus: 3.2 mg/dL (ref 2.3–4.6)
Potassium: 4.2 meq/L (ref 3.5–5.1)
Potassium: 3.9 mEq/L (ref 3.5–5.1)
Sodium: 135 meq/L (ref 135–145)

## 2013-01-02 LAB — BLOOD GAS, ARTERIAL
Acid-Base Excess: 1.5 mmol/L (ref 0.0–2.0)
Bicarbonate: 25.9 meq/L — ABNORMAL HIGH (ref 20.0–24.0)
Drawn by: 33176
FIO2: 0.6 %
MECHVT: 550 mL
O2 Saturation: 97.8 %
PEEP: 10 cmH2O
Patient temperature: 98.6
RATE: 24 {breaths}/min
TCO2: 27.2 mmol/L (ref 0–100)
pCO2 arterial: 42.9 mmHg (ref 35.0–45.0)
pH, Arterial: 7.398 (ref 7.350–7.450)
pO2, Arterial: 96.2 mmHg (ref 80.0–100.0)

## 2013-01-02 LAB — CULTURE, RESPIRATORY W GRAM STAIN

## 2013-01-02 LAB — CBC
HCT: 30.3 % — ABNORMAL LOW (ref 39.0–52.0)
HCT: 30.3 % — ABNORMAL LOW (ref 39.0–52.0)
Hemoglobin: 9.9 g/dL — ABNORMAL LOW (ref 13.0–17.0)
Hemoglobin: 9.5 g/dL — ABNORMAL LOW (ref 13.0–17.0)
MCH: 31 pg (ref 26.0–34.0)
MCH: 31.9 pg (ref 26.0–34.0)
MCH: 32.3 pg (ref 26.0–34.0)
MCHC: 31.4 g/dL (ref 30.0–36.0)
MCHC: 32.7 g/dL (ref 30.0–36.0)
MCHC: 33 g/dL (ref 30.0–36.0)
MCV: 97.7 fL (ref 78.0–100.0)
MCV: 97.7 fL (ref 78.0–100.0)
MCV: 99 fL (ref 78.0–100.0)
Platelets: 63 K/uL — ABNORMAL LOW (ref 150–400)
Platelets: 69 10*3/uL — ABNORMAL LOW (ref 150–400)
Platelets: 70 K/uL — ABNORMAL LOW (ref 150–400)
RBC: 3.1 MIL/uL — ABNORMAL LOW (ref 4.22–5.81)
RBC: 3.06 MIL/uL — ABNORMAL LOW (ref 4.22–5.81)
RDW: 15.4 % (ref 11.5–15.5)
RDW: 15.4 % (ref 11.5–15.5)
RDW: 15.5 % (ref 11.5–15.5)
WBC: 24.8 K/uL — ABNORMAL HIGH (ref 4.0–10.5)
WBC: 23.9 K/uL — ABNORMAL HIGH (ref 4.0–10.5)

## 2013-01-02 LAB — BASIC METABOLIC PANEL
CO2: 24 mEq/L (ref 19–32)
CO2: 27 mEq/L (ref 19–32)
Calcium: 7.8 mg/dL — ABNORMAL LOW (ref 8.4–10.5)
Calcium: 7.9 mg/dL — ABNORMAL LOW (ref 8.4–10.5)
Chloride: 103 mEq/L (ref 96–112)
Creatinine, Ser: 3.92 mg/dL — ABNORMAL HIGH (ref 0.50–1.35)
GFR calc Af Amer: 21 mL/min — ABNORMAL LOW (ref >90)
GFR calc non Af Amer: 16 mL/min — ABNORMAL LOW (ref >90)
Glucose, Bld: 131 mg/dL — ABNORMAL HIGH (ref 70–99)
Sodium: 137 mEq/L (ref 135–145)

## 2013-01-02 LAB — IRON AND TIBC
Iron: 36 ug/dL — ABNORMAL LOW (ref 42–135)
Saturation Ratios: 21 % (ref 20–55)
TIBC: 173 ug/dL — ABNORMAL LOW (ref 215–435)
UIBC: 137 ug/dL (ref 125–400)

## 2013-01-02 LAB — FERRITIN: Ferritin: 6946 ng/mL — ABNORMAL HIGH (ref 22–322)

## 2013-01-02 LAB — GLUCOSE, CAPILLARY
Glucose-Capillary: 109 mg/dL — ABNORMAL HIGH (ref 70–99)
Glucose-Capillary: 121 mg/dL — ABNORMAL HIGH (ref 70–99)
Glucose-Capillary: 119 mg/dL — ABNORMAL HIGH (ref 70–99)

## 2013-01-02 MED ORDER — DEXTROSE 5 % IV SOLN
1.0000 g | INTRAVENOUS | Status: DC
  Administered 2013-01-02 – 2013-01-04 (×3): 1 g via INTRAVENOUS
  Filled 2013-01-02 (×4): qty 10

## 2013-01-02 MED ORDER — DARBEPOETIN ALFA-POLYSORBATE 150 MCG/0.3ML IJ SOLN
150.0000 ug | INTRAMUSCULAR | Status: DC
  Administered 2013-01-02 – 2013-01-23 (×4): 150 ug via SUBCUTANEOUS
  Filled 2013-01-02 (×5): qty 0.3

## 2013-01-02 NOTE — Progress Notes (Addendum)
[facility] Rounding Note  S:Remains intubated and sedated Impella device out, still on dobutamine but off pressors CRRT pulling - 50/hour Effluent dose 18 ml/kg/hour  O:BP 146/69  Pulse 83  Temp(Src) 97.7 F (36.5 C) (Core (Comment))  Resp 24  Ht 6' (1.829 m)  Wt 138.5 kg (305 lb 5.4 oz)  BMI 41.4 kg/m2  SpO2 99%  Intake/Output Summary (Last 24 hours) at 01/02/13 1014 Last data filed at 01/02/13 0900  Gross per 24 hour  Intake 2577.6 ml  Output   4042 ml  Net -1464.4 ml   Weight change: -2.5 kg (-5 lb 8.2 oz) ZOX:[id number], sedated On CRRT via temp right IJ catheter (7/26) CVS:RRR Resp:clear anteriorly  Abd:+ BS obese, mild distension, very tight and firm No obvious pain with palpation Ext:2+ edema right leg splinted NEURO:sedated  WEIGHTS: 01/02/13 0500 ! 138.5 kg  01/01/13 0441 ! 141 kg   12/31/12 0500 ! 141.4 kg   12/30/12 0500 135.8 kg   12/29/12 2300 133.3 kg   12/29/12 1708 121.564 kg   MEDICATIONS  . antiseptic oral rinse  15 mL Mouth Rinse QID  . chlorhexidine  15 mL Mouth Rinse BID  . docusate  100 mg Oral BID  . feeding supplement  60 mL Oral 5 X Daily  . heparin subcutaneous  5,000 Units Subcutaneous Q8H  . insulin aspart  1 Units Subcutaneous Q4H  . insulin aspart  1-3 Units Subcutaneous Q4H  . insulin glargine  10 Units Subcutaneous Q24H  . pantoprazole sodium  40 mg Per Tube Q1200  . piperacillin-tazobactam (ZOSYN)  IV  2.25 g Intravenous Q6H  . banana bag IV 1000 mL   Intravenous Once  . sodium chloride  10-40 mL Intracatheter Q12H  . sodium chloride  3 mL Intravenous Q12H  . sodium polystyrene  30 g Per Tube Once  . vancomycin  1,250 mg Intravenous Q24H   . sodium chloride Stopped (01/01/13 1400)  . sodium chloride Stopped (12/31/12 2300)  . amiodarone (NEXTERONE) IV bolus only 150 mg/100 mL    . dextrose    . DOBUTamine 2.5 mcg/kg/min (01/01/13 2000)  . feeding supplement (PROMOTE) 1,000 mL (01/02/13 0000)  . fentaNYL  infusion INTRAVENOUS 160 mcg/hr (01/01/13 2300)  . midazolam (VERSED) infusion 3 mg/hr (01/02/13 0416)  . norepinephrine (LEVOPHED) Adult infusion Stopped (01/01/13 1800)  . phenylephrine (NEO-SYNEPHRINE) Adult infusion 100 mcg/min (12/29/12 1808)  . dialysis replacement fluid (prismasate) 500 mL/hr at 01/02/13 0300  . dialysis replacement fluid (prismasate) 300 mL/hr at 01/01/13 1921  . dialysate (PRISMASATE) 1,500 mL/hr at 01/02/13 0652   Basic Metabolic Panel:  Recent Labs  81/19/14 0433  01/01/13 1555 01/02/13 01/02/13 0330  NA  --   < > 137 137 135  K  --   < > 4.5 4.3 4.2  CL  --   < > 101 103 101  CO2  --   < > 25 27 27   GLUCOSE  --   < > 146* 131* 128*  BUN  --   < > 58* 60* 59*  CREATININE  --   < > 4.03* 3.92* 3.74*  CALCIUM  --   < > 8.0* 7.8* 7.9*  MG 2.2  --   --   --  2.6*  PHOS  --   < > 3.5  --  3.2  < > = values in this interval not displayed.  Recent Labs  12/31/12 0500  01/01/13 1555 01/02/13 0330  AST 6396*  --   --   --  ALT 3909*  --   --   --   ALKPHOS 105  --   --   --   BILITOT 2.2*  --   --   --   PROT 4.5*  --   --   --   ALBUMIN 2.1*  < > 2.0* 2.0*  < > = values in this interval not displayed. CBC:  Recent Labs  01/02/13 01/02/13 0330  WBC 24.2* 24.8*  HGB 10.0* 9.9*  HCT 30.3* 30.3*  MCV 97.7 97.7  PLT 69* 70*   Anemia Panel: No results found for this basename: VITAMINB12, FOLATE, FERRITIN, TIBC, IRON, RETICCTPCT,  in the last 72 hours  Dg Chest Port 1 View  01/02/2013   *RADIOLOGY REPORT*  Clinical Data: Endotracheal tube position  PORTABLE CHEST - 1 VIEW  Comparison: 01/01/2013  Findings: Endotracheal tube is appropriately positioned.  Insert NG right IJ central line tip terminates over the distal SVC.  Mild cardiomegaly process of central vascular congestion and small pleural effusions obscuring lung bases.  IMPRESSION: Support apparatus as above.  Central vascular congestion with small pleural effusions.   Original Report  Authenticated By: Christiana Pellant, M.D.   Dg Chest Port 1 View  01/01/2013   *RADIOLOGY REPORT*  Clinical Data: Hypoxia  PORTABLE CHEST - 1 VIEW  Comparison: December 31, 2012  Findings:  Endotracheal tube tip is 2.5 cm above the carina. Central catheters are unchanged in position. Nasogastric tube tip and side port are in the stomach region.  No apparent pneumothorax.  There is consolidation in the left lower lobe with small left effusion.  The right lung is clear.  Heart is borderline prominent with normal pulmonary vascularity.  No appreciable adenopathy.  IMPRESSION:  Tube and catheter positions as described.  No pneumothorax. There is consolidation in the left lower lobe with small left effusion.  Right lung clear.   Original Report Authenticated By: Bretta Bang, M.D.   Dg Chest Port 1 View  12/31/2012   *RADIOLOGY REPORT*  Clinical Data: Critically ill patient with possible change in position of one of the indwelling support apparatus, the Impella device.  PORTABLE CHEST - 1 VIEW 12/31/2012 1141 hours:  Comparison: Portable chest x-ray earlier same date 0608 hours.  Findings: The Impella device which has been placed from a femoral approach is unchanged in position since the examination earlier today, with the distal hook in the left ventricle and the device traversing the aortic valve.  Endotracheal tube tip somewhat low, chest approximately 2 cm above the carina, as noted previously. Remaining support apparatus satisfactory with the Swan-Ganz catheter tip overlying the proximal right main pulmonary artery, the right jugular central venous catheter tip overlying the lower SVC, the left subclavian central venous catheter tip projecting over the upper SVC, and the nasogastric tube looped in the stomach. External pacing pads.  Markedly suboptimal inspiration with atelectasis in the lower lobes, right greater than left, unchanged. Pulmonary venous hypertension without overt edema, unchanged.  No new  pulmonary parenchymal abnormalities.  IMPRESSION:  1.  Support apparatus satisfactory with the exception that the endotracheal tube tip is low, just about 2 cm above the carina. Specifically, no change in the position of the Impella device since earlier same date. 2.  Suboptimal inspiration with atelectasis in the lung bases, right greater than left, unchanged since earlier in the day. 3.  Pulmonary venous hypertension without overt edema, also unchanged. 4.  No new pulmonary parenchymal abnormalities.   Original Report Authenticated By: Hulan Saas,  M.D.   Assessment/Recommendations 52 yo male SP Rt ankle surgery 7/20 who presented to ER with AMS thought to be due to narcotic usage,  coded 3 times in the ED,  taken emergently to cath lab and had Rt and Lt heart cath(nonobstructive ds/EF 20%) and placement of impella device. Scr 1.76 on adm with unknown baseline, with development of ischemic ATN requiring initiation of CRRT (7/25)  Acute vs acute on chronic renal failure sec ischemic ATN from hypotension/cardiiogenic shock +/- IV contrast.   UO remains minimal.   BP stable.  Impella device out.   Continue CRRT Increase to -100 per hour No heparin d/t Jehovah Witness status Adding aranesp 150/week for anemia since cannot transfuse and check iron studies K and phos look OK at present  Nicholas Potts B

## 2013-01-02 NOTE — Progress Notes (Signed)
Subjective: POD 6 s/p outpatient right achilles tendon debridement and reconstruction.  Events of the weekend noted.  Pt still intubated and sedated.    Objective: Vital signs in last 24 hours: Temp:  [97.5 F (36.4 C)-98.8 F (37.1 C)] 97.9 F (36.6 C) (07/28 0700) Pulse Rate:  [90-101] 90 (07/28 0301) Resp:  [16-24] 24 (07/28 0700) BP: (108-135)/(51-67) 135/67 mmHg (07/28 0301) SpO2:  [94 %-100 %] 99 % (07/28 0700) Arterial Line BP: (100-144)/(51-69) 134/65 mmHg (07/28 0700) FiO2 (%):  [60 %-70 %] 60 % (07/28 0400) Weight:  [138.5 kg (305 lb 5.4 oz)] 138.5 kg (305 lb 5.4 oz) (07/28 0500)  Intake/Output from previous day: 07/27 0701 - 07/28 0700 In: 2882.3 [I.V.:907.3; NG/GT:1150; IV Piggyback:465] Out: 4450 [Urine:25] Intake/Output this shift:     Recent Labs  12/31/12 1245 01/01/13 0433 01/01/13 1555 01/02/13 01/02/13 0330  HGB 11.0* 11.3* 10.4* 10.0* 9.9*    Recent Labs  01/02/13 01/02/13 0330  WBC 24.2* 24.8*  RBC 3.10* 3.10*  HCT 30.3* 30.3*  PLT 69* 70*    Recent Labs  01/02/13 01/02/13 0330  NA 137 135  K 4.3 4.2  CL 103 101  CO2 27 27  BUN 60* 59*  CREATININE 3.92* 3.74*  GLUCOSE 131* 128*  CALCIUM 7.8* 7.9*   No results found for this basename: LABPT, INR,  in the last 72 hours  PE:  splint intact and in good condition.  Toes well perfused.  Assessment/Plan: Pt splint may be removed if necessary.  Wounds are on post calf and heel. I'll continue to follow.    Toni Arthurs 01/02/2013, 7:35 AM

## 2013-01-02 NOTE — Progress Notes (Signed)
PULMONARY  / CRITICAL CARE MEDICINE  Name: Nicholas Potts MRN: [id number] DOB: [birth year]    ADMISSION DATE:  12/29/2012  REFERRING MD :  EDP- Dr. Dan Humphreys PRIMARY SERVICE: PCCM  CHIEF COMPLAINT:  Acute Overdose  BRIEF PATIENT DESCRIPTION: [name] who presented to [facility] ER on 7/24 with AMS related to oxycodone overdose.  Underwent R achilles tendon reconstruction 7/20 with RLE cast in place on admit.  COurse complicated by respiratory arrest initially, then full cardiac arrest. He had episodes of ventricular tachycardia and was defibrillated . He had CPR with return of spontaneous circulation. Emergent cath >> EF 20%, mild non obstructive CAD. Required placement of Impella device for cardiogenic shock  SIGNIFICANT EVENTS / STUDIES:  7/20 - R ankle surgery (? Type, no MC Records) ........................................................................................................ 7/24 - Admit to [facility] with ER with acute oxy IR OD 7/25 duplex neg DVT 7/26- impella out 7/28-Dobutamine, levo remains  LINES / TUBES: L [facility] TLC 7/24>>> OETT 7/24>>> Aline 7/24>>> Femoral swan ganz 7/24>>>7/27 Impella device 7/24>>>7/26  CULTURES: Sputum 7/25>>>non-pathogenic oropharyngeal-type flora insolated (rare WBC present, mostly PMN) BCx2 7/24>>> UC 7/24>>>ng  ANTIBIOTICS: Vanco 7/24>>>>7/28 Zosyn 7/2/4>>>7/28 Ceftriaxone 7/28>>>  SUBJECTIVE: Patient intubated and sedated, nurse at bedside.   VITAL SIGNS: Temp:  [97.9 F (36.6 C)-98.8 F (37.1 C)] 97.9 F (36.6 C) (07/28 0700) Pulse Rate:  [86-101] 86 (07/28 0800) Resp:  [16-24] 24 (07/28 0700) BP: (115-135)/(60-67) 135/67 mmHg (07/28 0301) SpO2:  [94 %-100 %] 99 % (07/28 0700) Arterial Line BP: (100-144)/(51-69) 134/65 mmHg (07/28 0700) FiO2 (%):  [60 %-70 %] 60 % (07/28 0800) Weight:  [305 lb 5.4 oz (138.5 kg)] 305 lb 5.4 oz (138.5 kg) (07/28 0500)  HEMODYNAMICS: PAP: (45-53)/(26-31) 45/27 mmHg CVP:  [11 mmHg-19 mmHg] 18  mmHg PCWP:  [14 mmHg] 14 mmHg CO:  [9.3 L/min] 9.3 L/min CI:  [3.7 L/min/m2] 3.7 L/min/m2  VENTILATOR SETTINGS: Vent Mode:  [-] PRVC FiO2 (%):  [60 %-70 %] 60 % Set Rate:  [24 bmp] 24 bmp Vt Set:  [550 mL] 550 mL PEEP:  [10 cmH20] 10 cmH20 Plateau Pressure:  [27 cmH20-30 cmH20] 27 cmH20  INTAKE / OUTPUT: Intake/Output     07/27 0701 - 07/28 0700 07/28 0701 - 07/29 0700   I.V. (mL/kg) 907.3 (6.6) 33.6 (0.2)   Other 360    NG/GT 1150 40   IV Piggyback 465    Total Intake(mL/kg) 2882.3 (20.8) 73.6 (0.5)   Urine (mL/kg/hr) 25 (0) 5 (0)   Other 4425 (1.3) 154 (0.7)   Total Output 4450 159   Net -1567.7 -85.4         No BM since admission  PHYSICAL EXAMINATION: General: acutely ill  on vent Neuro:  sedated on vent, not responsive to commands, RASS -3, tremor rt hand HEENT: short thick neck Cardiovascular:  s1s2 distant, regular Lungs:  resp's course and even/non-labored bilaterally Abdomen:  Distended, Round/soft, bsx4 active Musculoskeletal:  No acute deformities, RLE with partial hard cast Skin:  Cool, dry, no edema  LABS:  Recent Labs Lab 12/29/12 1430  12/30/12 0037  12/30/12 0050  12/30/12 0325 12/30/12 0349 12/30/12 0500  12/31/12 0500 12/31/12 0942 12/31/12 1032  12/31/12 1430  01/01/13 0433 01/01/13 0500 01/01/13 0752 01/01/13 1555 01/02/13 01/02/13 0330 01/02/13 0348  HGB 16.3  --   --   --   --   < >  --   --   --   < > 12.4*  --   --   < >  --   --  11.3*  --   --  10.4* 10.0* 9.9*  --   WBC 17.8*  --   --   --   --   < >  --   --   --   < > 23.5*  --   --   < >  --   --  27.6*  --   --  24.3* 24.2* 24.8*  --   PLT 180  --   --   --   --   < >  --   --   --   < > 113*  --   --   < >  --   --  94*  --   --  74* 69* 70*  --   NA 137  --   --   --  138  --   --   --   --   < > 139  --   --   < >  --   < >  --  137  --  137 137 135  --   K 6.2*  --   --   --  5.0  --   --   --   --   < > 4.0  --   --   < >  --   < >  --  4.4  --  4.5 4.3 4.2  --   CL  97  --   --   --  101  --   --   --   --   < > 100  --   --   < >  --   < >  --  102  --  101 103 101  --   CO2 21  --   --   --  24  --   --   --   --   < > 28  --   --   < >  --   < >  --  24  --  25 27 27   --   GLUCOSE 163*  --   --   --  269*  --   --   --   --   < > 156*  --   --   < >  --   < >  --  123*  --  146* 131* 128*  --   BUN 19  --   --   --  30*  --   --   --   --   < > 71*  --   --   < >  --   < >  --  59*  --  58* 60* 59*  --   CREATININE 1.76*  --   --   --  2.51*  --   --   --   --   < > 5.07*  --   --   < >  --   < >  --  4.14*  --  4.03* 3.92* 3.74*  --   CALCIUM 8.4  --   --   --  7.6*  --   --   --   --   < > 7.2*  --   --   < >  --   < >  --  7.9*  --  8.0* 7.8* 7.9*  --   MG  --   --   --   < >  2.2  --   --   --   --   --  2.0  --   --   --   --   --  2.2  --   --   --   --  2.6*  --   PHOS  --   --   --   < > 0.6*  --   --   --   --   --  2.0*  --   --   --   --   < >  --  4.0  --  3.5  --  3.2  --   AST 703*  --   --   --  4696*  --   --   --   --   --  [place]*  --   --   --   --   --   --   --   --   --   --   --   --   ALT 791*  --   --   --  4490*  --   --   --   --   --  3909*  --   --   --   --   --   --   --   --   --   --   --   --   ALKPHOS 121*  --   --   --  91  --   --   --   --   --  105  --   --   --   --   --   --   --   --   --   --   --   --   BILITOT 0.6  --   --   --  2.1*  --   --   --   --   --  2.2*  --   --   --   --   --   --   --   --   --   --   --   --   PROT 7.1  --   --   --  4.9*  --   --   --   --   --  4.5*  --   --   --   --   --   --   --   --   --   --   --   --   ALBUMIN 3.2*  --   --   --  2.3*  --   --   --   --   --  2.1*  --   --   --   --   < >  --  2.1*  --  2.0*  --  2.0*  --   INR  --   --   --   --   --   --  2.76*  --   --   --   --   --   --   --   --   --   --   --   --   --   --   --   --   LATICACIDVEN 9.4*  --   --   --   --   --   --   --   --   --   --   --   --   --   --   --   --   --   --   --   --   --   --  TROPONINI   --   --   --   --   --   --   --   --   --   --   --   --  0.88*  --  0.66*  --   --   --   --   --   --   --   --   PROBNP  --   --   --   --   --   --   --   --  3296.0*  --   --   --   --   --   --   --   --   --   --   --   --   --   --   PHART  --   < > 7.351  --   --   --   --  7.448  --   < >  --  7.486*  --   --   --   --   --   --  7.376  --   --   --  7.398  PCO2ART  --   < > 35.7  --   --   --   --  32.9*  --   < >  --  34.4*  --   --   --   --   --   --  40.9  --   --   --  42.9  PO2ART  --   < > 85.0  --   --   --   --  99.0  --   < >  --  67.0*  --   --   --   --   --   --  67.0*  --   --   --  96.2  < > = values in this interval not displayed.  Recent Labs Lab 01/01/13 1128 01/01/13 1557 01/01/13 1931 01/01/13 2337 01/02/13 0341  GLUCAP 122* 133* 109* 127* 109*   CXR: 7/28 - central vascular congestion with small pleural effusions, ett wnl  ASSESSMENT / PLAN:  PULMONARY A: Acute Respiratory Failure - in setting of acute oxycodone overdose.  ARDS -low PCWP ? Aspiration PNA edema P:   - Full vent support,on PEEP 10 -ttirate FIO2/PEEP per ARDS protocol - Wean PEEP/FiO2 for sats > 92% as tolerated, hope with further neg balance will improve peep to goal 5 once to 40% -agree for neg balance further -pcxr in am   -if to peep 5 40%, then cpap 5 ps 5  CARDIOVASCULAR A:  Cardiogenic shock resolved - echo EF 35%, Periapical hypokinesis. Post VT arrest EKG 7/28 - Normal sinus rhythm, low voltage QRS, nonspecific T wave abnormality  P:  - continue Levophed, taper dobutamine to off, will d/w cards -off pressos, agree increase volume off  RENAL A:   Acute Kidney Injury - secondary to overdose, ? Prolonged down time Hyperkalemia - resolved Anion Gap Acidosis - lactic  Non-Gap Metabolic Acidosis - ? Early renal failure P:   -CVVH per Renal , would keep further negative -ABG in AM -monitor BMP daily  GASTROINTESTINAL A:   Morbid Obesity Shock  liver Constipation P:   - Protonix - continue TF - colace regimen (on high dose fent) with dulcolax, may need enema - Monitor LFT's in am   HEMATOLOGIC A:   Macrocytic Anemia - ??ETOH abuse Thrombocytopenia P:  - Folate/MVI/thiamine - monitor CBC daily -  monitor for bleeding -scd left  INFECTIOUS A:   Rule out Aspiration PNA - not impressed P:   - Vanco/zosyn for aspiration-narrow as culture neg to ceftriaxone  ENDOCRINE A:   Concern for Metabolic Syndrome, HbA1c 5.6  P:   - SSI  NEUROLOGIC A:   Acute Encephalopathy - in setting of overdose, known oxy IR +/- other substances, head Ct neg ? ETOH Abuse R/o seizure focus P:   - Supportive care. - Sedation protocol with versed/fentanyl, WUA -eeg now ,assess partial seizure rt  Today's summary: goal to neg 100 cc/hr cvvhd, goal to peep 5, 40% then wean if able, consider dc dobut  I have personally obtained a history, examined the patient, evaluated laboratory and imaging results, formulated the assessment and plan and placed orders.  CRITICAL CARE: The patient is critically ill with multiple organ systems failure and requires high complexity decision making for assessment and support, frequent evaluation and titration of therapies, application of advanced monitoring technologies and extensive interpretation of multiple databases. Critical Care Time devoted to patient care services described in this note is 45 minutes.   Leo Rod, PA-S  Mcarthur Rossetti. Tyson Alias, MD, FACP Pgr: [phone] [facility]

## 2013-01-02 NOTE — Progress Notes (Signed)
Subjective:  Patient remains intubated, sedated. Moves upper ext with stimulation and withdraws to pain  Objective:  Vital Signs in the last 24 hours: Temp:  [97.5 F (36.4 C)-98.8 F (37.1 C)] 98.1 F (36.7 C) (07/28 0600) Pulse Rate:  [90-101] 90 (07/28 0301) Resp:  [16-24] 24 (07/28 0600) BP: (108-135)/(51-67) 135/67 mmHg (07/28 0301) SpO2:  [94 %-100 %] 99 % (07/28 0600) Arterial Line BP: (100-144)/(47-69) 144/69 mmHg (07/28 0600) FiO2 (%):  [60 %-70 %] 60 % (07/28 0400) Weight:  [305 lb 5.4 oz (138.5 kg)] 305 lb 5.4 oz (138.5 kg) (07/28 0500)  Intake/Output from previous day: 07/27 0701 - 07/28 0700 In: 2808.7 [I.V.:873.7; NG/GT:1110; IV Piggyback:465] Out: 4185 [Urine:25] Intake/Output from this shift: Total I/O In: 1159.6 [I.V.:369.6; NG/GT:690; IV Piggyback:100] Out: 1578 [Urine:25; Other:1553]  . antiseptic oral rinse  15 mL Mouth Rinse QID  . chlorhexidine  15 mL Mouth Rinse BID  . docusate  100 mg Oral BID  . feeding supplement  60 mL Oral 5 X Daily  . heparin subcutaneous  5,000 Units Subcutaneous Q8H  . insulin aspart  1 Units Subcutaneous Q4H  . insulin aspart  1-3 Units Subcutaneous Q4H  . insulin glargine  10 Units Subcutaneous Q24H  . pantoprazole sodium  40 mg Per Tube Q1200  . piperacillin-tazobactam (ZOSYN)  IV  2.25 g Intravenous Q6H  . banana bag IV 1000 mL   Intravenous Once  . sodium chloride  10-40 mL Intracatheter Q12H  . sodium chloride  3 mL Intravenous Q12H  . sodium polystyrene  30 g Per Tube Once  . vancomycin  1,250 mg Intravenous Q24H   . sodium chloride Stopped (01/01/13 1400)  . sodium chloride Stopped (12/31/12 2300)  . amiodarone (NEXTERONE) IV bolus only 150 mg/100 mL    . dextrose    . DOBUTamine 2.5 mcg/kg/min (01/01/13 2000)  . feeding supplement (PROMOTE) 1,000 mL (01/02/13 0000)  . fentaNYL infusion INTRAVENOUS 160 mcg/hr (01/01/13 2300)  . midazolam (VERSED) infusion 3 mg/hr (01/02/13 0416)  . norepinephrine (LEVOPHED)  Adult infusion Stopped (01/01/13 1800)  . phenylephrine (NEO-SYNEPHRINE) Adult infusion 100 mcg/min (12/29/12 1808)  . dialysis replacement fluid (prismasate) 500 mL/hr at 01/02/13 0300  . dialysis replacement fluid (prismasate) 300 mL/hr at 01/01/13 1921  . dialysate (PRISMASATE) 1,500 mL/hr at 01/02/13 0321    Physical Exam: The patient is sedated on vent. Diffuse anasarca  Neck with dialysis and central venous catheter  Chest diffuse rhonchi  Heart sounds are very distant. No murmur gallop or rub.  Abd mildly distended, no masses Scrotal edema  Extremities reveal 2 + edema  Neurologic exam not testable.   Lab Results:  Recent Labs  01/02/13 01/02/13 0330  WBC 24.2* 24.8*  HGB 10.0* 9.9*  PLT 69* 70*    Recent Labs  01/02/13 01/02/13 0330  NA 137 135  K 4.3 4.2  CL 103 101  CO2 27 27  GLUCOSE 131* 128*  BUN 60* 59*  CREATININE 3.92* 3.74*    Recent Labs  12/31/12 1032 12/31/12 1430  TROPONINI 0.88* 0.66*   Hepatic Function Panel  Recent Labs  12/31/12 0500  01/02/13 0330  PROT 4.5*  --   --   ALBUMIN 2.1*  < > 2.0*  AST 6396*  --   --   ALT 3909*  --   --   ALKPHOS 105  --   --   BILITOT 2.2*  --   --   BILIDIR 1.3*  --   --  IBILI 0.9  --   --   < > = values in this interval not displayed.  Imaging: Imaging results have been reviewed  Cardiac Studies: Telemetry shows NSR. NSVT yesterday  Assessment/Plan:  1. Cardiogenic shock- improved; nonobstructive CAD by cath. Impella device is out. BP improved now off all pressors other than dobutamine. Will continue for now and begin wean in AM; if BP stable, add low dose hydralazine/nitrates in AM followed by beta blockade; no ACEI for now given acute renal insuff. 2. S/P respiratory and cardiac arrest-etiology of events at time of admission still not entirely clear; we have not ruled out pulmonary embolus. Lower ext dopplers are neg. Heparin DCed which I think is appropriate given acute blood  loss anemia and inability to transfuse (Jehovah's witness). If he improves, would like to pursue VQ scan following extubation. No CT given renal insuff. Presently on DVT prophylaxis heparin. 3. Acute renal failure-most likely ATN from cardiac arrest/cardiogenic shock; also probable contribution from contrast; patient markedly volume overloaded; little urine output; continue CVVHD; nephrology managing. 4. VDRF-management per CCM 5. Acute blood loss anemia-patient is Jehovah's witness. 6. Thrombocytopenia-follow 7. Pneumonia-continue antibiotics.            LOS: 4 days    Nicholas Potts 01/02/2013, 6:48 AM

## 2013-01-02 NOTE — Progress Notes (Signed)
Portable EEG completed

## 2013-01-02 NOTE — Procedures (Signed)
ELECTROENCEPHALOGRAM REPORT   Patient: Nicholas Potts       Room #: 1610 EEG No. [id number] Age: 52 y.o.        Sex: male Referring Physician: Molli Knock Report Date:  01/02/2013        Interpreting Physician: Aline Brochure  History: [name] is an 53 y.o. male was admitted following an overdose of oxycodone, and subsequently experienced her expiratory and cardiac arrest.  Indications for study:  Assess severity of encephalopathy; rule out seizure activity.  Technique: This is an 18 channel routine scalp EEG performed at the bedside with bipolar and monopolar montages arranged in accordance to the international 10/20 system of electrode placement.   Description: EEG recording was performed at the patient's bedside in the intensive care unit. Patient was intubated and on mechanical ventilation as well as sedated with fentanyl and Versed. Predominant background activity consisted of low amplitude 1-2 Hz diffuse symmetrical irregular delta activity with superimposed diffuse fast activity which was most prominent in the frontal and central regions. Photic stimulation was not performed. No epileptiform discharges were recorded. There were no areas of disproportionate focal slowing.  Interpretation: This EEG is abnormal with moderately severe generalized nonspecific slowing of cerebral activity, which can be seen with a wide variety of encephalopathic processes. No epileptic activity was recorded.  Venetia Maxon M.D. [facility] [phone]

## 2013-01-02 NOTE — Progress Notes (Signed)
Dr. Tyson Alias notified of no results from tap water enema.  Small smear of stool noted on pad, pad otherwise dry.

## 2013-01-03 ENCOUNTER — Inpatient Hospital Stay (HOSPITAL_COMMUNITY): Payer: BC Managed Care – PPO

## 2013-01-03 DIAGNOSIS — R609 Edema, unspecified: Secondary | ICD-10-CM

## 2013-01-03 LAB — GLUCOSE, CAPILLARY
Glucose-Capillary: 105 mg/dL — ABNORMAL HIGH (ref 70–99)
Glucose-Capillary: 133 mg/dL — ABNORMAL HIGH (ref 70–99)
Glucose-Capillary: 102 mg/dL — ABNORMAL HIGH (ref 70–99)

## 2013-01-03 LAB — CBC WITH DIFFERENTIAL/PLATELET
Basophils Absolute: 0.2 10*3/uL — ABNORMAL HIGH (ref 0.0–0.1)
Eosinophils Absolute: 0.2 10*3/uL (ref 0.0–0.7)
HCT: 31.4 % — ABNORMAL LOW (ref 39.0–52.0)
Lymphocytes Relative: 9 % — ABNORMAL LOW (ref 12–46)
Monocytes Relative: 10 % (ref 3–12)
Neutro Abs: 18 10*3/uL — ABNORMAL HIGH (ref 1.7–7.7)
Neutrophils Relative %: 79 % — ABNORMAL HIGH (ref 43–77)
Platelets: 58 10*3/uL — ABNORMAL LOW (ref 150–400)
RDW: 15.4 % (ref 11.5–15.5)
WBC: 22.8 10*3/uL — ABNORMAL HIGH (ref 4.0–10.5)

## 2013-01-03 LAB — CBC
HCT: 31.9 % — ABNORMAL LOW (ref 39.0–52.0)
HCT: 33 % — ABNORMAL LOW (ref 39.0–52.0)
Hemoglobin: 10.3 g/dL — ABNORMAL LOW (ref 13.0–17.0)
Hemoglobin: 10.3 g/dL — ABNORMAL LOW (ref 13.0–17.0)
MCHC: 31.2 g/dL (ref 30.0–36.0)
MCHC: 32.3 g/dL (ref 30.0–36.0)
MCV: 99.7 fL (ref 78.0–100.0)
RBC: 3.24 MIL/uL — ABNORMAL LOW (ref 4.22–5.81)
RDW: 15.4 % (ref 11.5–15.5)
WBC: 25.1 10*3/uL — ABNORMAL HIGH (ref 4.0–10.5)

## 2013-01-03 LAB — BLOOD GAS, ARTERIAL
Acid-Base Excess: 0.8 mmol/L (ref 0.0–2.0)
Drawn by: 33176
FIO2: 0.4 %
MECHVT: 550 mL
O2 Saturation: 97.9 %
RATE: 24 resp/min
pO2, Arterial: 80.8 mmHg (ref 80.0–100.0)

## 2013-01-03 LAB — BASIC METABOLIC PANEL
BUN: 57 mg/dL — ABNORMAL HIGH (ref 6–23)
BUN: 60 mg/dL — ABNORMAL HIGH (ref 6–23)
CO2: 24 mEq/L (ref 19–32)
Chloride: 100 mEq/L (ref 96–112)
Chloride: 100 mEq/L (ref 96–112)
Creatinine, Ser: 3.41 mg/dL — ABNORMAL HIGH (ref 0.50–1.35)
GFR calc Af Amer: 22 mL/min — ABNORMAL LOW (ref >90)
GFR calc non Af Amer: 19 mL/min — ABNORMAL LOW (ref >90)
Potassium: 3.9 mEq/L (ref 3.5–5.1)
Potassium: 4.2 mEq/L (ref 3.5–5.1)
Sodium: 134 mEq/L — ABNORMAL LOW (ref 135–145)

## 2013-01-03 LAB — COMPREHENSIVE METABOLIC PANEL
Alkaline Phosphatase: 215 U/L — ABNORMAL HIGH (ref 39–117)
BUN: 59 mg/dL — ABNORMAL HIGH (ref 6–23)
CO2: 25 mEq/L (ref 19–32)
GFR calc Af Amer: 21 mL/min — ABNORMAL LOW (ref >90)
GFR calc non Af Amer: 18 mL/min — ABNORMAL LOW (ref >90)
Glucose, Bld: 95 mg/dL (ref 70–99)
Potassium: 4.4 mEq/L (ref 3.5–5.1)
Total Protein: 5.9 g/dL — ABNORMAL LOW (ref 6.0–8.3)

## 2013-01-03 LAB — RENAL FUNCTION PANEL
Albumin: 1.9 g/dL — ABNORMAL LOW (ref 3.5–5.2)
BUN: 62 mg/dL — ABNORMAL HIGH (ref 6–23)
Chloride: 101 mEq/L (ref 96–112)
Creatinine, Ser: 3.34 mg/dL — ABNORMAL HIGH (ref 0.50–1.35)
Glucose, Bld: 154 mg/dL — ABNORMAL HIGH (ref 70–99)
Phosphorus: 3.6 mg/dL (ref 2.3–4.6)
Potassium: 4 mEq/L (ref 3.5–5.1)

## 2013-01-03 LAB — PATHOLOGIST SMEAR REVIEW

## 2013-01-03 MED ORDER — LACTULOSE 10 GM/15ML PO SOLN
30.0000 g | Freq: Two times a day (BID) | ORAL | Status: DC
  Administered 2013-01-03: 30 g via ORAL
  Filled 2013-01-03 (×2): qty 45

## 2013-01-03 MED ORDER — ISOSORBIDE DINITRATE 10 MG PO TABS
10.0000 mg | ORAL_TABLET | Freq: Three times a day (TID) | ORAL | Status: DC
  Administered 2013-01-03 – 2013-01-10 (×18): 10 mg
  Filled 2013-01-03 (×28): qty 1

## 2013-01-03 MED ORDER — ACETYLCYSTEINE 20 % IN SOLN
70.0000 mg/kg | RESPIRATORY_TRACT | Status: DC
  Administered 2013-01-03 – 2013-01-05 (×11): 9560 mg
  Filled 2013-01-03 (×19): qty 60

## 2013-01-03 MED ORDER — ACETYLCYSTEINE 20 % IN SOLN
140.0000 mg/kg | Freq: Once | RESPIRATORY_TRACT | Status: AC
  Administered 2013-01-03: 19120 mg
  Filled 2013-01-03: qty 120

## 2013-01-03 MED ORDER — ISOSORBIDE MONONITRATE ER 30 MG PO TB24
30.0000 mg | ORAL_TABLET | Freq: Every day | ORAL | Status: DC
  Filled 2013-01-03: qty 1

## 2013-01-03 MED ORDER — ACETYLCYSTEINE 20 % IN SOLN
70.0000 mg/kg | RESPIRATORY_TRACT | Status: DC
  Filled 2013-01-03 (×6): qty 60

## 2013-01-03 MED ORDER — METRONIDAZOLE 50 MG/ML ORAL SUSPENSION
500.0000 mg | Freq: Three times a day (TID) | ORAL | Status: DC
  Administered 2013-01-03 – 2013-01-05 (×6): 500 mg
  Filled 2013-01-03 (×9): qty 10

## 2013-01-03 MED ORDER — PROMOTE PO LIQD
1000.0000 mL | ORAL | Status: DC
  Filled 2013-01-03 (×5): qty 1000

## 2013-01-03 MED ORDER — ACETYLCYSTEINE 20 % IN SOLN
140.0000 mg/kg | Freq: Once | RESPIRATORY_TRACT | Status: DC
  Filled 2013-01-03: qty 120

## 2013-01-03 MED ORDER — ACETYLCYSTEINE 20 % IN SOLN
140.0000 mg/kg | Freq: Once | RESPIRATORY_TRACT | Status: DC
Start: 2013-01-03 — End: 2013-01-03
  Filled 2013-01-03: qty 120

## 2013-01-03 MED ORDER — PROPOFOL 10 MG/ML IV EMUL
5.0000 ug/kg/min | INTRAVENOUS | Status: DC
  Administered 2013-01-03: 5 ug/kg/min via INTRAVENOUS
  Administered 2013-01-03: 10 ug/kg/min via INTRAVENOUS
  Administered 2013-01-04: 20 ug/kg/min via INTRAVENOUS
  Administered 2013-01-04: 10 ug/kg/min via INTRAVENOUS
  Administered 2013-01-04: 30 ug/kg/min via INTRAVENOUS
  Administered 2013-01-04 – 2013-01-05 (×2): 20 ug/kg/min via INTRAVENOUS
  Administered 2013-01-05: 30 ug/kg/min via INTRAVENOUS
  Administered 2013-01-05: 20 ug/kg/min via INTRAVENOUS
  Administered 2013-01-05: 30 ug/kg/min via INTRAVENOUS
  Administered 2013-01-06 (×2): 20 ug/kg/min via INTRAVENOUS
  Administered 2013-01-06: 30 ug/kg/min via INTRAVENOUS
  Filled 2013-01-03: qty 200
  Filled 2013-01-03 (×13): qty 100

## 2013-01-03 MED ORDER — HYDRALAZINE HCL 10 MG PO TABS
10.0000 mg | ORAL_TABLET | Freq: Four times a day (QID) | ORAL | Status: DC
  Administered 2013-01-03 – 2013-01-04 (×4): 10 mg via ORAL
  Filled 2013-01-03 (×8): qty 1

## 2013-01-03 NOTE — Progress Notes (Signed)
Subjective:  Patient remains intubated, sedated. Moves upper ext with stimulation and withdraws to pain  Objective:  Vital Signs in the last 24 hours: Temp:  [97.7 F (36.5 C)-99.3 F (37.4 C)] 99.1 F (37.3 C) (07/29 0700) Pulse Rate:  [83-97] 90 (07/29 0300) Resp:  [8-24] 8 (07/29 0700) BP: (132-146)/(65-73) 144/71 mmHg (07/29 0300) SpO2:  [91 %-100 %] 98 % (07/29 0700) Arterial Line BP: (125-155)/(59-75) 146/68 mmHg (07/29 0700) FiO2 (%):  [40 %-50 %] 40 % (07/29 0400) Weight:  [301 lb 2.4 oz (136.6 kg)] 301 lb 2.4 oz (136.6 kg) (07/29 0500)  Intake/Output from previous day: 07/28 0701 - 07/29 0700 In: 2607.9 [I.V.:677.9; NG/GT:1640; IV Piggyback:290] Out: 5330 [Urine:22] Intake/Output from this shift:    . antiseptic oral rinse  15 mL Mouth Rinse QID  . cefTRIAXone (ROCEPHIN)  IV  1 g Intravenous Q24H  . chlorhexidine  15 mL Mouth Rinse BID  . darbepoetin (ARANESP) injection - NON-DIALYSIS  150 mcg Subcutaneous Q Mon-1800  . docusate  100 mg Oral BID  . feeding supplement  60 mL Oral 5 X Daily  . heparin subcutaneous  5,000 Units Subcutaneous Q8H  . insulin aspart  1 Units Subcutaneous Q4H  . insulin aspart  1-3 Units Subcutaneous Q4H  . insulin glargine  10 Units Subcutaneous Q24H  . pantoprazole sodium  40 mg Per Tube Q1200  . banana bag IV 1000 mL   Intravenous Once  . sodium chloride  10-40 mL Intracatheter Q12H  . sodium polystyrene  30 g Per Tube Once   . sodium chloride Stopped (01/01/13 1400)  . sodium chloride Stopped (12/31/12 2300)  . dextrose    . DOBUTamine Stopped (01/02/13 1630)  . feeding supplement (PROMOTE) 1,000 mL (01/03/13 0547)  . fentaNYL infusion INTRAVENOUS 200 mcg/hr (01/02/13 2000)  . midazolam (VERSED) infusion 3 mg/hr (01/02/13 2000)  . dialysis replacement fluid (prismasate) 500 mL/hr at 01/03/13 0000  . dialysis replacement fluid (prismasate) 300 mL/hr at 01/03/13 0718  . dialysate (PRISMASATE) 1,500 mL/hr at 01/03/13 5409     Physical Exam: The patient is sedated on vent. Diffuse anasarca  Neck with dialysis and central venous catheter  Chest diffuse rhonchi  Heart sounds are very distant. No murmur gallop or rub.  Abd mildly distended, no masses Scrotal edema  Extremities reveal 2 + edema  Neurologic exam not testable.   Lab Results:  Recent Labs  01/02/13 2345 01/03/13 0530  WBC 25.1* 22.8*  HGB 10.3* 9.9*  PLT 67* 58*    Recent Labs  01/03/13 0004 01/03/13 0530  NA 134* 136  K 3.9 4.4  CL 100 102  CO2 24 25  GLUCOSE 123* 95  BUN 57* 59*  CREATININE 3.40* 3.53*    Recent Labs  12/31/12 1032 12/31/12 1430  TROPONINI 0.88* 0.66*   Hepatic Function Panel  Recent Labs  01/03/13 0530  PROT 5.9*  ALBUMIN 2.2*  AST 244*  ALT 1183*  ALKPHOS 215*  BILITOT 1.6*    Imaging: Imaging results have been reviewed  Cardiac Studies: Telemetry shows NSR.   Assessment/Plan:  1. Cardiogenic shock- improved; nonobstructive CAD by cath. Impella device is out. BP improved now off all pressors. Add low dose hydralazine/nitrates; beta blockade in AM if BP stable; no ACEI for now given acute renal insuff. 2. S/P respiratory and cardiac arrest-etiology of events at time of admission still not entirely clear; we have not ruled out pulmonary embolus. Lower ext dopplers are neg. Heparin DCed which I  think is appropriate given acute blood loss anemia and inability to transfuse (Jehovah's witness). If he improves, would like to pursue VQ scan following extubation. No CT given renal insuff.  3. Acute renal failure-most likely ATN from cardiac arrest/cardiogenic shock; also probable contribution from contrast; patient markedly volume overloaded; little urine output; continue CVVHD; nephrology managing. 4. VDRF-management per CCM 5. Acute blood loss anemia-patient is Jehovah's witness. 6. Thrombocytopenia-Plt continue to fall; would DC heparin completely and use SCDs. Check HIT panel 7.  Pneumonia-continue antibiotics.            LOS: 5 days    Nicholas Potts 01/03/2013, 8:01 AM

## 2013-01-03 NOTE — Progress Notes (Signed)
45cc versed drip 1 mg/ml wasted in sink followed by water flush.

## 2013-01-03 NOTE — Progress Notes (Signed)
Right femoral arterial line removed.  Pressure held for 10 minutes.  Site soft, no evidence of bleeding or hematoma.  Patient tolerated procedure without incident.

## 2013-01-03 NOTE — Progress Notes (Signed)
eLink Physician-Brief Progress Note Patient Name: Nicholas Potts DOB: 1960-07-30 MRN: [id number]  Date of Service  01/03/2013   HPI/Events of Note  Upper ex SVT +   eICU Interventions  proobably needs oral DTI for 1 month. Bedside MD to decide   Intervention Category Intermediate Interventions: Oliguria - evaluation and management Minor Interventions: Routine modifications to care plan (e.g. PRN medications for pain, fever)  Nicholas Potts 01/03/2013, 6:32 PM

## 2013-01-03 NOTE — Progress Notes (Signed)
NUTRITION FOLLOW UP  Intervention:   1.  Enteral nutrition; decrease Promote to 35 mL/hr and continue Prostat 60 mL (2 packets) 5 times daily to provide 1840 kcal, 206g protein, 704 mL free water.   Nutrition provided by TF + propofol:  1948 kcal, 206g protein, 704 mL free water.   Nutrition Dx:   Inadequate oral intake, ongoing  Monitor:   1. Enteral nutrition; initiation with tolerance. Enteral nutrition to provide 60-70% of estimated calorie needs (22-25 kcals/kg ideal body weight) and 100% of estimated protein needs, based on ASPEN guidelines for permissive underfeeding in critically ill obese individuals. Met. 2. Wt/wt change; monitor trends.  Ongoing.  Assessment:   Pt with recent Achilles tendon surgery admitted with AMS; etiology unclear.  Developed PEA arrest requiring CPR and intubation. Pt now with cardiogenic shock, acute kidney injury, and shock liver.  Requiring CVVHD. Patient is currently intubated on ventilator support.  MV: 13.6 L/min Temp:Temp (24hrs), Avg:98.7 F (37.1 C), Min:97.9 F (36.6 C), Max:99.3 F (37.4 C)  Propofol: 0 ml/hr currently, however per RN to resume at 5 mcg/kg which provides 108 kcal/d.    Height: Ht Readings from Last 1 Encounters:  12/29/12 6' (1.829 m)    Weight Status:   Wt Readings from Last 1 Encounters:  01/03/13 301 lb 2.4 oz (136.6 kg)    Re-estimated needs:  Kcal: 2612 Permissive underfeeding goals:  1780-2020 Protein: >/=202g Fluid: ~2.4 L/day or per MD discretion  Skin: generalized edema  Diet Order:  NPO   Intake/Output Summary (Last 24 hours) at 01/03/13 1015 Last data filed at 01/03/13 1000  Gross per 24 hour  Intake 2274.48 ml  Output   5271 ml  Net -2996.52 ml    Last BM: PTA   Labs:   Recent Labs Lab 01/01/13 0433  01/02/13 0330  01/02/13 1615 01/03/13 0004 01/03/13 0530  NA  --   < > 135  < > 135 134* 136  K  --   < > 4.2  < > 3.9 3.9 4.4  CL  --   < > 101  < > 101 100 102  CO2  --   < >  27  < > 25 24 25   BUN  --   < > 59*  < > 57* 57* 59*  CREATININE  --   < > 3.74*  < > 3.53* 3.40* 3.53*  CALCIUM  --   < > 7.9*  < > 7.9* 8.0* 8.1*  MG 2.2  --  2.6*  --   --   --  2.9*  PHOS  --   < > 3.2  --  3.3  --  3.6  GLUCOSE  --   < > 128*  < > 126* 123* 95  < > = values in this interval not displayed.  CBG (last 3)   Recent Labs  01/02/13 1917 01/02/13 2334 01/03/13 0345  GLUCAP 105* 104* 94    Scheduled Meds: . acetylcysteine  70 mg/kg Per Tube Q4H   Followed by  . acetylcysteine  140 mg/kg Per Tube Once  . antiseptic oral rinse  15 mL Mouth Rinse QID  . cefTRIAXone (ROCEPHIN)  IV  1 g Intravenous Q24H  . chlorhexidine  15 mL Mouth Rinse BID  . darbepoetin (ARANESP) injection - NON-DIALYSIS  150 mcg Subcutaneous Q Mon-1800  . docusate  100 mg Oral BID  . feeding supplement  60 mL Oral 5 X Daily  . hydrALAZINE  10 mg  Oral Q6H  . insulin aspart  1 Units Subcutaneous Q4H  . insulin aspart  1-3 Units Subcutaneous Q4H  . insulin glargine  10 Units Subcutaneous Q24H  . isosorbide dinitrate  10 mg Per Tube TID  . isosorbide mononitrate  30 mg Oral Daily  . lactulose  30 g Oral BID  . pantoprazole sodium  40 mg Per Tube Q1200  . banana bag IV 1000 mL   Intravenous Once  . sodium chloride  10-40 mL Intracatheter Q12H  . sodium polystyrene  30 g Per Tube Once    Continuous Infusions: . sodium chloride Stopped (01/01/13 1400)  . sodium chloride Stopped (12/31/12 2300)  . dextrose    . feeding supplement (PROMOTE) 1,000 mL (01/03/13 0547)  . fentaNYL infusion INTRAVENOUS 100 mcg/hr (01/03/13 0932)  . dialysis replacement fluid (prismasate) 500 mL/hr at 01/03/13 0000  . dialysis replacement fluid (prismasate) 300 mL/hr at 01/03/13 0718  . dialysate (PRISMASATE) 1,500 mL/hr at 01/03/13 0739  . propofol 5 mcg/kg/min (01/03/13 0955)    Loyce Dys, MS RD LDN Clinical Inpatient Dietitian Pager: [phone] Weekend/After hours pager: [phone]

## 2013-01-03 NOTE — Progress Notes (Signed)
PULMONARY  / CRITICAL CARE MEDICINE  Name: Nicholas Potts MRN: [id number] DOB: 06-Feb-1961    ADMISSION DATE:  12/29/2012  REFERRING MD :  EDP- Dr. Dan Humphreys PRIMARY SERVICE: PCCM  CHIEF COMPLAINT:  Acute Overdose  BRIEF PATIENT DESCRIPTION: [name] who presented to [facility] ER on 7/24 with AMS related to oxycodone overdose.  Underwent R achilles tendon reconstruction 7/20 with RLE cast in place on admit.  COurse complicated by respiratory arrest initially, then full cardiac arrest. He had episodes of ventricular tachycardia and was defibrillated . He had CPR with return of spontaneous circulation. Emergent cath >> EF 20%, mild non obstructive CAD. Required placement of Impella device for cardiogenic shock  SIGNIFICANT EVENTS / STUDIES:  7/20 - R ankle surgery (? Type, no [facility] Records) ........................................................................................................ 7/24 - Admit to [facility] with ER with acute oxy IR OD 7/25 duplex neg DVT 7/26- impella out 7/28-Dobutamine 7/28 eeg- This EEG is abnormal with moderately severe generalized nonspecific slowing of cerebral activity, which can be seen with a wide variety of encephalopathic processes. No epileptic activity was recorded.  7/29 - vent settings improved to peep 5, neg 2.5 liters cvvhd  LINES / TUBES: L  TLC 7/24>>> OETT 7/24>>> Aline 7/24>>>729 Femoral swan ganz 7/24>>>7/27 Impella device 7/24>>>7/26  CULTURES: Sputum 7/25>>>non-pathogenic oropharyngeal-type flora insolated (rare WBC present, mostly PMN) BCx2 7/24>>> UC 7/24>>>ng  ANTIBIOTICS: Vanco 7/24>>>>7/28 Zosyn 7/2/4>>>7/28 Ceftriaxone 7/28>>>  SUBJECTIVE:  Patient sedated, responds to some commands  VITAL SIGNS: Temp:  [97.7 F (36.5 C)-99.3 F (37.4 C)] 99.1 F (37.3 C) (07/29 0700) Pulse Rate:  [83-97] 90 (07/29 0300) Resp:  [8-24] 8 (07/29 0700) BP: (132-146)/(65-73) 144/71 mmHg (07/29 0300) SpO2:  [91 %-100 %] 98 % (07/29  0700) Arterial Line BP: (125-155)/(59-75) 146/68 mmHg (07/29 0700) FiO2 (%):  [40 %-60 %] 40 % (07/29 0400) Weight:  [301 lb 2.4 oz (136.6 kg)] 301 lb 2.4 oz (136.6 kg) (07/29 0500)  HEMODYNAMICS: CVP:  [13 mmHg-19 mmHg] 16 mmHg  VENTILATOR SETTINGS: Vent Mode:  [-] PRVC FiO2 (%):  [40 %-60 %] 40 % Set Rate:  [24 bmp] 24 bmp Vt Set:  [550 mL] 550 mL PEEP:  [8 cmH20-10 cmH20] 8 cmH20 Plateau Pressure:  [28 cmH20-30 cmH20] 29 cmH20  INTAKE / OUTPUT: Intake/Output     07/28 0701 - 07/29 0700 07/29 0701 - 07/30 0700   I.V. (mL/kg) 677.9 (5)    Other     NG/GT 1640    IV Piggyback 290    Total Intake(mL/kg) 2607.9 (19.1)    Urine (mL/kg/hr) 22 (0)    Other 5308 (1.6)    Total Output 5330     Net -2722.1          Stool Occurrence 1 x      PHYSICAL EXAMINATION: General: acutely ill  on vent Neuro:  sedated on vent, some response to commands, RASS -3, tremors of upper extremity, patient could squeeze nurses hands HEENT: short thick neck Cardiovascular:  s1s2 distant, regular Lungs:  resp's course and even/non-labored bilaterally Abdomen:  Distended, Round/soft, bsx4 active Musculoskeletal:  No acute deformities, RLE with partial hard cast Skin:  Cool, dry, no edema, L groin region has large bruising at site of impella, scrotal enlargement   LABS:  Recent Labs Lab 12/29/12 1430  12/30/12 0037  12/30/12 0050  12/30/12 0325 12/30/12 0349 12/30/12 0500  12/31/12 0500  12/31/12 1032  12/31/12 1430  01/01/13 0433  01/01/13 0752  01/02/13 0330 01/02/13 0348 01/02/13 1115  01/02/13 1615 01/02/13 2345 01/03/13 0004 01/03/13 0325 01/03/13 0530  HGB 16.3  --   --   --   --   < >  --   --   --   < > 12.4*  --   --   < >  --   --  11.3*  --   --   < > 9.9*  --  9.5*  --  10.3*  --   --  9.9*  WBC 17.8*  --   --   --   --   < >  --   --   --   < > 23.5*  --   --   < >  --   --  27.6*  --   --   < > 24.8*  --  23.9*  --  25.1*  --   --  22.8*  PLT 180  --   --   --   --   <  >  --   --   --   < > 113*  --   --   < >  --   --  94*  --   --   < > 70*  --  63*  --  67*  --   --  58*  NA 137  --   --   --  138  --   --   --   --   < > 139  --   --   < >  --   < >  --   < >  --   < > 135  --  137 135  --  134*  --  136  K 6.2*  --   --   --  5.0  --   --   --   --   < > 4.0  --   --   < >  --   < >  --   < >  --   < > 4.2  --  4.3 3.9  --  3.9  --  4.4  CL 97  --   --   --  101  --   --   --   --   < > 100  --   --   < >  --   < >  --   < >  --   < > 101  --  103 101  --  100  --  102  CO2 21  --   --   --  24  --   --   --   --   < > 28  --   --   < >  --   < >  --   < >  --   < > 27  --  24 25  --  24  --  25  GLUCOSE 163*  --   --   --  269*  --   --   --   --   < > 156*  --   --   < >  --   < >  --   < >  --   < > 128*  --  146* 126*  --  123*  --  95  BUN 19  --   --   --  30*  --   --   --   --   < >  71*  --   --   < >  --   < >  --   < >  --   < > 59*  --  59* 57*  --  57*  --  59*  CREATININE 1.76*  --   --   --  2.51*  --   --   --   --   < > 5.07*  --   --   < >  --   < >  --   < >  --   < > 3.74*  --  3.59* 3.53*  --  3.40*  --  3.53*  CALCIUM 8.4  --   --   --  7.6*  --   --   --   --   < > 7.2*  --   --   < >  --   < >  --   < >  --   < > 7.9*  --  7.9* 7.9*  --  8.0*  --  8.1*  MG  --   --   --   < > 2.2  --   --   --   --   --  2.0  --   --   --   --   --  2.2  --   --   --  2.6*  --   --   --   --   --   --  2.9*  PHOS  --   --   --   < > 0.6*  --   --   --   --   --  2.0*  --   --   --   --   < >  --   < >  --   < > 3.2  --   --  3.3  --   --   --  3.6  AST 703*  --   --   --  4098*  --   --   --   --   --  [place]*  --   --   --   --   --   --   --   --   --   --   --   --   --   --   --   --  244*  ALT 791*  --   --   --  4490*  --   --   --   --   --  3909*  --   --   --   --   --   --   --   --   --   --   --   --   --   --   --   --  1183*  ALKPHOS 121*  --   --   --  91  --   --   --   --   --  105  --   --   --   --   --   --   --   --   --   --   --   --    --   --   --   --  215*  BILITOT 0.6  --   --   --  2.1*  --   --   --   --   --  2.2*  --   --   --   --   --   --   --   --   --   --   --   --   --   --   --   --  1.6*  PROT 7.1  --   --   --  4.9*  --   --   --   --   --  4.5*  --   --   --   --   --   --   --   --   --   --   --   --   --   --   --   --  5.9*  ALBUMIN 3.2*  --   --   --  2.3*  --   --   --   --   --  2.1*  --   --   --   --   < >  --   < >  --   < > 2.0*  --   --  2.1*  --   --   --  2.2*  INR  --   --   --   --   --   --  2.76*  --   --   --   --   --   --   --   --   --   --   --   --   --   --   --   --   --   --   --   --   --   LATICACIDVEN 9.4*  --   --   --   --   --   --   --   --   --   --   --   --   --   --   --   --   --   --   --   --   --   --   --   --   --   --   --   TROPONINI  --   --   --   --   --   --   --   --   --   --   --   --  0.88*  --  0.66*  --   --   --   --   --   --   --   --   --   --   --   --   --   PROBNP  --   --   --   --   --   --   --   --  3296.0*  --   --   --   --   --   --   --   --   --   --   --   --   --   --   --   --   --   --   --   PHART  --   < > 7.351  --   --   --   --  7.448  --   < >  --   < >  --   --   --   --   --   --  7.376  --   --  7.398  --   --   --   --  7.350  --   PCO2ART  --   < > 35.7  --   --   --   --  32.9*  --   < >  --   < >  --   --   --   --   --   --  40.9  --   --  42.9  --   --   --   --  47.9*  --   PO2ART  --   < > 85.0  --   --   --   --  99.0  --   < >  --   < >  --   --   --   --   --   --  67.0*  --   --  96.2  --   --   --   --  80.8  --   < > = values in this interval not displayed.  Recent Labs Lab 01/02/13 1129 01/02/13 1616 01/02/13 1917 01/02/13 2334 01/03/13 0345  GLUCAP 119* 125* 105* 104* 94   CXR: 7/29 - low inspiratory volume, edema, bil layering effusions and infiltrate v. atx  ASSESSMENT / PLAN:  PULMONARY A: Acute Respiratory Failure - in setting of acute oxycodone overdose.  ARDS -low PCWP ? Aspiration  PNA Edema  P:   - ABg reviewed, plat 27, NO increase TV, keep rate 24 -wean attempts cpap 5 ps 5, goal 2 hrs, need wua -continue neg fluid balance -pcxr in am   -retract ett 1 cm  CARDIOVASCULAR A:  Cardiogenic shock - resolved - echo EF 35%, Periapical hypokinesis. Post VT arrest EKG 7/29 - Normal sinus rhythm, nonspecific T wave abnormality  P:  - BP meds initiated per cardiology -neg on cvvhd same goal  RENAL A:   Acute Kidney Injury - secondary to overdose, ? Prolonged down time Hyperkalemia - resolved Anion Gap Acidosis - lactic  Hypermagnesemia  P:   -CVVH per Renal , ? Continuing  Vs int hd -ABG in AM -monitor BMP daily  GASTROINTESTINAL A:   Morbid Obesity Shock liver resolving, r/o admission tylenol toxicity (no level noted) Constipation - resolving Hypoalbuminemia  Elevated LFTs - improving from admission P:   - Protonix - continue TF - colace regimen (on high dose fent) with dulcolax -add lactulose, see neuro -as LFt still increased, could make an argument to start empiric mucomyst's until resolved enzymes -no role levels at this stage -lft repeat in am   HEMATOLOGIC A:   Macrocytic Anemia - ??ETOH abuse Thrombocytopenia - possibly due to hematoma in R groin region Leukocytosis  Unlikely HITT (timing does not support, doppler neg legs, other causes unlikely), dilutional / sepsis / MODS / liver dz from etoh? P:  - Folate/MVI/thiamine - monitor CBC daily - monitor for bleeding - scd left leg -dc all forms heparin -doppler upper ext to build evidence further against DX, HITT assay sent, NO role DTi empiric for now  INFECTIOUS A:   Rule out Aspiration PNA - not impressed P:  -maintain ceftriaxone  ENDOCRINE A:   Concern for Metabolic Syndrome, HbA1c 5.6  P:   - SSI  NEUROLOGIC A:   Acute Encephalopathy - in setting of overdose, known oxy IR +/- other substances, head Ct neg ? ETOH Abuse R/o seizure focus Component hepatic enceph? P:    - Sedation protocol with versed/fentanyl, consider transition to propofol -eeg reviewed - WUA -add lactulose  Today's summary: goal to neg again today, add lactulose, empiric mucomysts fornow, lft in am , weaning attempts  I have personally obtained a history, examined the patient, evaluated laboratory and imaging results, formulated the assessment and plan and placed orders.  CRITICAL CARE: The patient is critically ill with multiple organ systems failure and requires high complexity decision making for  assessment and support, frequent evaluation and titration of therapies, application of advanced monitoring technologies and extensive interpretation of multiple databases. Critical Care Time devoted to patient care services described in this note is 35 minutes.   Leo Rod, PA-S  Mcarthur Rossetti. Tyson Alias, MD, FACP Pgr: [phone] [place]

## 2013-01-03 NOTE — Progress Notes (Signed)
[facility] Rounding Note  S:Remains intubated and sedated Impella device out, still on dobutamine No pressirs CRRT pulling - 100/hour  O:BP 153/74  Pulse 91  Temp(Src) 99.3 F (37.4 C) (Core (Comment))  Resp 24  Ht 6' (1.829 m)  Wt 136.6 kg (301 lb 2.4 oz)  BMI 40.83 kg/m2  SpO2 98%  Intake/Output Summary (Last 24 hours) at 01/03/13 1003 Last data filed at 01/03/13 1000  Gross per 24 hour  Intake 2274.48 ml  Output   5271 ml  Net -2996.52 ml   Weight change: -1.9 kg (-4 lb 3 oz) ZOX:WRUEAVWUJ, sedated On CRRT via temp right IJ catheter (7/26) WJX:BJYNWGN 80's Resp:clear anteriorly  Abd:+ BS obese, mild distension, very tight and firm No obvious pain with palpation Ext:2+ edema right leg splinted NEURO:sedated Minimal amt dark muddy brown urine in foley  WEIGHTS: 01/03/13 0500 ! 136.6 kg 01/02/13 0500 ! 138.5 kg  (incr neg 100/hr w/CRRT) 01/01/13 0441 ! 141 kg   12/31/12 0500 ! 141.4 kg   12/30/12 0500 135.8 kg     (CRRT started neg 50/hour) 12/29/12 2300 133.3 kg   12/29/12 1708 121.564 kg   MEDICATIONS  . acetylcysteine  70 mg/kg Per Tube Q4H   Followed by  . acetylcysteine  140 mg/kg Per Tube Once  . antiseptic oral rinse  15 mL Mouth Rinse QID  . cefTRIAXone (ROCEPHIN)  IV  1 g Intravenous Q24H  . chlorhexidine  15 mL Mouth Rinse BID  . darbepoetin (ARANESP) injection - NON-DIALYSIS  150 mcg Subcutaneous Q Mon-1800  . docusate  100 mg Oral BID  . feeding supplement  60 mL Oral 5 X Daily  . hydrALAZINE  10 mg Oral Q6H  . insulin aspart  1 Units Subcutaneous Q4H  . insulin aspart  1-3 Units Subcutaneous Q4H  . insulin glargine  10 Units Subcutaneous Q24H  . isosorbide dinitrate  10 mg Per Tube TID  . isosorbide mononitrate  30 mg Oral Daily  . lactulose  30 g Oral BID  . pantoprazole sodium  40 mg Per Tube Q1200  . banana bag IV 1000 mL   Intravenous Once  . sodium chloride  10-40 mL Intracatheter Q12H  . sodium polystyrene  30 g Per  Tube Once   . sodium chloride Stopped (01/01/13 1400)  . sodium chloride Stopped (12/31/12 2300)  . dextrose    . feeding supplement (PROMOTE) 1,000 mL (01/03/13 0547)  . fentaNYL infusion INTRAVENOUS 100 mcg/hr (01/03/13 0932)  . dialysis replacement fluid (prismasate) 500 mL/hr at 01/03/13 0000  . dialysis replacement fluid (prismasate) 300 mL/hr at 01/03/13 0718  . dialysate (PRISMASATE) 1,500 mL/hr at 01/03/13 0739  . propofol 5 mcg/kg/min (01/03/13 0955)   Basic Metabolic Panel:  Recent Labs  56/21/30 0330  01/02/13 1615 01/03/13 0004 01/03/13 0530  NA 135  < > 135 134* 136  K 4.2  < > 3.9 3.9 4.4  CL 101  < > 101 100 102  CO2 27  < > 25 24 25   GLUCOSE 128*  < > 126* 123* 95  BUN 59*  < > 57* 57* 59*  CREATININE 3.74*  < > 3.53* 3.40* 3.53*  CALCIUM 7.9*  < > 7.9* 8.0* 8.1*  MG 2.6*  --   --   --  2.9*  PHOS 3.2  --  3.3  --  3.6  < > = values in this interval not displayed.  Recent Labs  01/02/13 1615 01/03/13 0530  AST  --  244*  ALT  --  1183*  ALKPHOS  --  215*  BILITOT  --  1.6*  PROT  --  5.9*  ALBUMIN 2.1* 2.2*   CBC:  Recent Labs  01/02/13 2345 01/03/13 0530  WBC 25.1* 22.8*  NEUTROABS  --  18.0*  HGB 10.3* 9.9*  HCT 31.9* 31.4*  MCV 98.5 99.1  PLT 67* 58*   Anemia Panel:  Recent Labs  01/02/13 1115  FERRITIN 6946*  TIBC 173*  IRON 36*   Results for Nicholas Potts (MRN [id number]) as of 01/03/2013 10:07  Ref. Range 12/31/2012 05:00 01/03/2013 05:30  AST Latest Range: 0-37 U/L 6396 (H) 244 (H)  ALT Latest Range: 0-53 U/L 3909 (H) 1183 (H)  Total Protein Latest Range: 6.0-8.3 g/dL 4.5 (L) 5.9 (L)  Bilirubin, Direct Latest Range: 0.0-0.3 mg/dL 1.3 (H)   Indirect Bilirubin Latest Range: 0.3-0.9 mg/dL 0.9   Total Bilirubin Latest Range: 0.3-1.2 mg/dL 2.2 (H) 1.6 (H)   Dg Chest Port 1 View  01/03/2013   *RADIOLOGY REPORT*  Clinical Data: Endotracheal tube placement  PORTABLE CHEST - 1 VIEW  Comparison: Prior chest x-ray 01/02/2013   Findings: The tip of the endotracheal tube is 1.5 cm above the carina.  The patient is in a kyphotic position.  The right IJ temporary hemodialysis catheter tip projects over the distal SVC. The tip of the left subclavian approach central venous catheter projects over the superior cavoatrial junction. The nasogastric tube can be seen coiled within the gastric fundus.  Inspiratory volumes are very low.  There is mild pulmonary edema and diffuse bilateral interstitial and airspace opacities.  Probable bilateral layering effusions.  Stable cardiomegaly.  IMPRESSION:  1.  The tip of the endotracheal tube is 1.5 cm above the carina. Relatively distal placement of the endotracheal tube likely exaggerated by the patient is kyphotic positioning. 2.  Other support apparatus in stable and satisfactory position. 3.  Similar appearance of the lungs with very low inspiratory volumes, edema, bilateral layering effusions and infiltrate versus atelectasis.   Original Report Authenticated By: Malachy Moan, M.D.   Dg Chest Port 1 View  01/02/2013   *RADIOLOGY REPORT*  Clinical Data: Endotracheal tube position  PORTABLE CHEST - 1 VIEW  Comparison: 01/01/2013  Findings: Endotracheal tube is appropriately positioned.  Insert NG right IJ central line tip terminates over the distal SVC.  Mild cardiomegaly process of central vascular congestion and small pleural effusions obscuring lung bases.  IMPRESSION: Support apparatus as above.  Central vascular congestion with small pleural effusions.   Original Report Authenticated By: Christiana Pellant, M.D.   Assessment/Recommendations  52 yo male SP Rt ankle surgery 7/20 who presented to ER with AMS thought to be due to narcotic usage,  coded 3 times in the ED,  taken emergently to cath lab and had Rt and Lt heart cath(nonobstructive ds/EF 20%) and placement of impella device. Scr 1.76 on adm with unknown baseline, with development of ischemic ATN requiring initiation of CRRT  (7/25)  Acute vs acute on chronic renal failure sec ischemic ATN from hypotension/cardiogenic shock +/- IV contrast.   UO remains minimal.   BP stable off pressors Impella device out.  Still 15 kg above adm weight  Continue CRRT (started 7/25) Increase to -150 per hour No heparin d/t Jehov[employer] Witness status Added aranesp 150/week for anemia (7/28)since cannot transfuse  Iron low but will not replete with ferritin of nearly 7000 K and phos look OK at present  Nicholas Potts

## 2013-01-03 NOTE — Progress Notes (Signed)
Bilateral upper extremity venous duplex completed.  Right:  No evidence of DVT.  There appears to be superficial thrombosis in the basilic and antecubital communicating veins.  Left:  No evidence of DVT or superficial thrombosis.

## 2013-01-04 ENCOUNTER — Inpatient Hospital Stay (HOSPITAL_COMMUNITY): Payer: BC Managed Care – PPO

## 2013-01-04 LAB — GLUCOSE, CAPILLARY
Glucose-Capillary: 100 mg/dL — ABNORMAL HIGH (ref 70–99)
Glucose-Capillary: 118 mg/dL — ABNORMAL HIGH (ref 70–99)
Glucose-Capillary: 104 mg/dL — ABNORMAL HIGH (ref 70–99)

## 2013-01-04 LAB — BASIC METABOLIC PANEL
BUN: 58 mg/dL — ABNORMAL HIGH (ref 6–23)
BUN: 67 mg/dL — ABNORMAL HIGH (ref 6–23)
CO2: 24 mEq/L (ref 19–32)
Chloride: 99 mEq/L (ref 96–112)
Creatinine, Ser: 3.62 mg/dL — ABNORMAL HIGH (ref 0.50–1.35)
GFR calc Af Amer: 23 mL/min — ABNORMAL LOW (ref >90)
GFR calc Af Amer: 21 mL/min — ABNORMAL LOW (ref >90)
GFR calc non Af Amer: 20 mL/min — ABNORMAL LOW (ref >90)
GFR calc non Af Amer: 21 mL/min — ABNORMAL LOW (ref >90)
Glucose, Bld: 119 mg/dL — ABNORMAL HIGH (ref 70–99)
Glucose, Bld: 144 mg/dL — ABNORMAL HIGH (ref 70–99)
Potassium: 4.2 mEq/L (ref 3.5–5.1)
Potassium: 4.3 mEq/L (ref 3.5–5.1)
Sodium: 137 mEq/L (ref 135–145)
Sodium: 137 mEq/L (ref 135–145)

## 2013-01-04 LAB — CBC
HCT: 32.1 % — ABNORMAL LOW (ref 39.0–52.0)
HCT: 26.9 % — ABNORMAL LOW (ref 39.0–52.0)
Hemoglobin: 10.2 g/dL — ABNORMAL LOW (ref 13.0–17.0)
MCH: 32.2 pg (ref 26.0–34.0)
MCH: 32.4 pg (ref 26.0–34.0)
MCHC: 33.1 g/dL (ref 30.0–36.0)
MCHC: 33.8 g/dL (ref 30.0–36.0)
MCV: 97.2 fL (ref 78.0–100.0)
Platelets: 62 10*3/uL — ABNORMAL LOW (ref 150–400)
RBC: 3.17 MIL/uL — ABNORMAL LOW (ref 4.22–5.81)
RDW: 15.1 % (ref 11.5–15.5)
RDW: 15.1 % (ref 11.5–15.5)
WBC: 28.3 10*3/uL — ABNORMAL HIGH (ref 4.0–10.5)

## 2013-01-04 LAB — RENAL FUNCTION PANEL
Albumin: 2.1 g/dL — ABNORMAL LOW (ref 3.5–5.2)
BUN: 65 mg/dL — ABNORMAL HIGH (ref 6–23)
Calcium: 8.5 mg/dL (ref 8.4–10.5)
Creatinine, Ser: 3.53 mg/dL — ABNORMAL HIGH (ref 0.50–1.35)
Glucose, Bld: 129 mg/dL — ABNORMAL HIGH (ref 70–99)
Phosphorus: 3.4 mg/dL (ref 2.3–4.6)
Potassium: 4 mEq/L (ref 3.5–5.1)

## 2013-01-04 LAB — LACTIC ACID, PLASMA: Lactic Acid, Venous: 0.9 mmol/L (ref 0.5–2.2)

## 2013-01-04 LAB — HEPATIC FUNCTION PANEL
Albumin: 2.3 g/dL — ABNORMAL LOW (ref 3.5–5.2)
Total Protein: 6.7 g/dL (ref 6.0–8.3)

## 2013-01-04 LAB — CULTURE, BLOOD (ROUTINE X 2): Culture: NO GROWTH

## 2013-01-04 MED ORDER — CARVEDILOL 3.125 MG PO TABS
3.1250 mg | ORAL_TABLET | Freq: Two times a day (BID) | ORAL | Status: DC
  Administered 2013-01-04 – 2013-01-08 (×9): 3.125 mg via NASOGASTRIC
  Filled 2013-01-04 (×13): qty 1

## 2013-01-04 MED ORDER — HYDRALAZINE HCL 25 MG PO TABS
25.0000 mg | ORAL_TABLET | Freq: Three times a day (TID) | ORAL | Status: DC
  Administered 2013-01-04 – 2013-01-13 (×20): 25 mg via ORAL
  Filled 2013-01-04 (×30): qty 1

## 2013-01-04 NOTE — Progress Notes (Signed)
Dr.Alva made aware of continious shivering and that warm blankets had been placed on patient. Oral temperature is stable. Sedation did have to be turned down in order for patient to open eyes to stimulus and nodded head to hearing my voice. Continue to monitor closely.

## 2013-01-04 NOTE — Procedures (Signed)
Bronchoscopy Procedure Note FAWZI MELMAN [id number] [birth year]  Procedure: Bronchoscopy Indications: Diagnostic evaluation of the airways, Obtain specimens for culture and/or other diagnostic studies and Remove secretions  Procedure Details Consent: Risks of procedure as well as the alternatives and risks of each were explained to the (patient/caregiver).  Consent for procedure obtained. Time Out: Verified patient identification, verified procedure, site/side was marked, verified correct patient position, special equipment/implants available, medications/allergies/relevent history reviewed, required imaging and test results available.  Performed  In preparation for procedure, patient was given 100% FiO2 and bronchoscope lubricated. Sedation: propofol  Airway entered and the following bronchi were examined: RUL, RML, RLL, LUL, LLL and Bronchi.   Procedures performed: Brushings performed Bronchoscope removed.    Evaluation Hemodynamic Status: BP stable throughout; O2 sats: stable throughout Patient's Current Condition: stable Specimens:  Sent serosanguinous fluid Complications: No apparent complications Patient did tolerate procedure well.   Nelda Bucks. 01/04/2013   1. No signiicant secretions 2. No major distal plugging 3. BAL RLL, clear, white  Mcarthur Rossetti. Tyson Alias, MD, FACP Pgr: [phone] [facility]

## 2013-01-04 NOTE — Progress Notes (Signed)
PULMONARY  / CRITICAL CARE MEDICINE  Name: CALLIN ASHE MRN: [id number] DOB: 06-18-1960    ADMISSION DATE:  12/29/2012  REFERRING MD :  EDP- Dr. Dan Humphreys PRIMARY SERVICE: PCCM  CHIEF COMPLAINT:  Acute Overdose  BRIEF PATIENT DESCRIPTION: [name] who presented to [facility] ER on 7/24 with AMS related to oxycodone overdose.  Underwent R achilles tendon reconstruction 7/20 with RLE cast in place on admit.  COurse complicated by respiratory arrest initially, then full cardiac arrest. He had episodes of ventricular tachycardia and was defibrillated . He had CPR with return of spontaneous circulation. Emergent cath >> EF 20%, mild non obstructive CAD. Required placement of Impella device for cardiogenic shock  SIGNIFICANT EVENTS / STUDIES:  7/20 - R ankle surgery (? Type, no [facility] Records) ........................................................................................................ 7/24 - Admit to [facility] with ER with acute oxy IR OD 7/25 duplex neg DVT 7/26- impella out 7/28-Dobutamine 7/28 eeg- This EEG is abnormal with moderately severe generalized nonspecific slowing of cerebral activity, which can be seen with a wide variety of encephalopathic processes. No epileptic activity was recorded.  7/29 - vent settings improved to peep 5, neg 2.5 liters cvvhd 7/30 upper ext doppler US - no obvious evidence of DVT of R or L upper ext, appears to be superficial thrombosis in R basilic and antecubital communicating veins  LINES / TUBES: L Silo TLC 7/24>>> OETT 7/24>>> Aline 7/24>>>729 Femoral swan ganz 7/24>>>7/27 Impella device 7/24>>>7/26  CULTURES: Sputum 7/25>>>non-pathogenic oropharyngeal-type flora insolated (rare WBC present, mostly PMN) BCx2 7/24>>> UC 7/24>>>ng C. Diff 7/29>>>neg  ANTIBIOTICS: Vanco 7/24>>>>7/28 Zosyn 7/2/4>>>7/28 Ceftriaxone 7/28>>>  SUBJECTIVE:  Secretions thick  VITAL SIGNS: Temp:  [98.2 F (36.8 C)-99.7 F (37.6 C)] 98.8 F (37.1 C) (07/30  0400) Pulse Rate:  [88-109] 109 (07/30 0734) Resp:  [14-29] 27 (07/30 0734) BP: (105-154)/(43-80) 142/78 mmHg (07/30 0734) SpO2:  [95 %-100 %] 99 % (07/30 0700) Arterial Line BP: (122-157)/(63-75) 122/63 mmHg (07/29 1800) FiO2 (%):  [40 %] 40 % (07/30 0734) Weight:  [288 lb 9.3 oz (130.9 kg)] 288 lb 9.3 oz (130.9 kg) (07/30 0400)  HEMODYNAMICS: CVP:  [12 mmHg-19 mmHg] 12 mmHg  VENTILATOR SETTINGS: Vent Mode:  [-] PRVC FiO2 (%):  [40 %] 40 % Set Rate:  [24 bmp] 24 bmp Vt Set:  [500 mL-550 mL] 550 mL PEEP:  [5 cmH20] 5 cmH20 Plateau Pressure:  [21 cmH20-29 cmH20] 21 cmH20  INTAKE / OUTPUT: Intake/Output     07/29 0701 - 07/30 0700 07/30 0701 - 07/31 0700   I.V. (mL/kg) 481.2 (3.7)    NG/GT 2755    IV Piggyback 50    Total Intake(mL/kg) 3286.2 (25.1)    Urine (mL/kg/hr) 61 (0)    Other 6034 (1.9)    Stool 580 (0.2)    Total Output 6675     Net -3388.8          Stool Occurrence 1 x      PHYSICAL EXAMINATION: General: acutely ill  on vent Neuro:  sedated on vent, some response to commands, RASS -1, tremors of upper extremity resolved, patient could squeeze nurses hands HEENT: short thick neck Cardiovascular:  s1s2 distant, regular Lungs:  coarse Abdomen:  Distended, Round/soft, bsx4 active Musculoskeletal:  No acute deformities, RLE with partial hard cast Skin:  Cool, dry, no edema, L groin region has large bruising at site of impella, scrotal enlargement about same  LABS:  Recent Labs Lab 12/29/12 1430  12/30/12 0037  12/30/12 0325 12/30/12 0349 12/30/12 0500  12/31/12 0500  12/31/12 1032  12/31/12 1430  01/01/13 0752  01/02/13 0330 01/02/13 0348  01/02/13 1615  01/03/13 0325 01/03/13 0530 01/03/13 1150 01/03/13 1610 01/04/13 0007 01/04/13 0400 01/04/13 0405  HGB 16.3  --   --   < >  --   --   --   < > 12.4*  --   --   < >  --   < >  --   < > 9.9*  --   < >  --   < >  --  9.9* 10.3*  --  10.2*  --  10.7*  WBC 17.8*  --   --   < >  --   --   --   < >  23.5*  --   --   < >  --   < >  --   < > 24.8*  --   < >  --   < >  --  22.8* 23.9*  --  25.6*  --  28.3*  PLT 180  --   --   < >  --   --   --   < > 113*  --   --   < >  --   < >  --   < > 70*  --   < >  --   < >  --  58* 61*  --  59*  --  62*  NA 137  --   --   < >  --   --   --   < > 139  --   --   < >  --   < >  --   < > 135  --   < > 135  < >  --  136 133* 137 137  --  137  K 6.2*  --   --   < >  --   --   --   < > 4.0  --   --   < >  --   < >  --   < > 4.2  --   < > 3.9  < >  --  4.4 4.2 4.0 4.2  --  4.3  CL 97  --   --   < >  --   --   --   < > 100  --   --   < >  --   < >  --   < > 101  --   < > 101  < >  --  102 100 101 97  --  97  CO2 21  --   --   < >  --   --   --   < > 28  --   --   < >  --   < >  --   < > 27  --   < > 25  < >  --  25 24 23 24   --  25  GLUCOSE 163*  --   --   < >  --   --   --   < > 156*  --   --   < >  --   < >  --   < > 128*  --   < > 126*  < >  --  95 143* 154* 119*  --  99  BUN 19  --   --   < >  --   --   --   < >  71*  --   --   < >  --   < >  --   < > 59*  --   < > 57*  < >  --  59* 60* 62* 58*  --  60*  CREATININE 1.76*  --   --   < >  --   --   --   < > 5.07*  --   --   < >  --   < >  --   < > 3.74*  --   < > 3.53*  < >  --  3.53* 3.41* 3.34* 3.15*  --  3.29*  CALCIUM 8.4  --   --   < >  --   --   --   < > 7.2*  --   --   < >  --   < >  --   < > 7.9*  --   < > 7.9*  < >  --  8.1* 8.2* 7.9* 8.3*  --  8.6  MG  --   --   --   < >  --   --   --   --  2.0  --   --   --   --   < >  --   --  2.6*  --   --   --   --   --  2.9*  --   --   --   --  3.1*  PHOS  --   --   --   < >  --   --   --   --  2.0*  --   --   --   --   < >  --   < > 3.2  --   --  3.3  --   --  3.6  --  3.6  --   --   --   AST 703*  --   --   < >  --   --   --   --  1610*  --   --   --   --   --   --   --   --   --   --   --   --   --  244*  --   --   --   --  117*  ALT 791*  --   --   < >  --   --   --   --  3909*  --   --   --   --   --   --   --   --   --   --   --   --   --  1183*  --   --   --   --   803*  ALKPHOS 121*  --   --   < >  --   --   --   --  105  --   --   --   --   --   --   --   --   --   --   --   --   --  215*  --   --   --   --  237*  BILITOT 0.6  --   --   < >  --   --   --   --  2.2*  --   --   --   --   --   --   --   --   --   --   --   --   --  1.6*  --   --   --   --  1.0  PROT 7.1  --   --   < >  --   --   --   --  4.5*  --   --   --   --   --   --   --   --   --   --   --   --   --  5.9*  --   --   --   --  6.7  ALBUMIN 3.2*  --   --   < >  --   --   --   --  2.1*  --   --   --   --   < >  --   < > 2.0*  --   --  2.1*  --   --  2.2*  --  1.9*  --   --  2.3*  INR  --   --   --   --  2.76*  --   --   --   --   --   --   --   --   --   --   --   --   --   --   --   --   --   --   --   --   --   --   --   LATICACIDVEN 9.4*  --   --   --   --   --   --   --   --   --   --   --   --   --   --   --   --   --   --   --   --   --   --   --   --   --  0.9  --   TROPONINI  --   --   --   --   --   --   --   --   --   --  0.88*  --  0.66*  --   --   --   --   --   --   --   --   --   --   --   --   --   --   --   PROBNP  --   --   --   --   --   --  3296.0*  --   --   --   --   --   --   --   --   --   --   --   --   --   --   --   --   --   --   --   --   --   PHART  --   < > 7.351  --   --  7.448  --   < >  --   < >  --   --   --   --  7.376  --   --  7.398  --   --   --  7.350  --   --   --   --   --   --   PCO2ART  --   < > 35.7  --   --  32.9*  --   < >  --   < >  --   --   --   --  40.9  --   --  42.9  --   --   --  47.9*  --   --   --   --   --   --   PO2ART  --   < > 85.0  --   --  99.0  --   < >  --   < >  --   --   --   --  67.0*  --   --  96.2  --   --   --  80.8  --   --   --   --   --   --   < > = values in this interval not displayed.  Recent Labs Lab 01/03/13 1150 01/03/13 1612 01/03/13 1927 01/03/13 2338 01/04/13 0358  GLUCAP 133* 151* 102* 101* 100*   CXR: 7/30 - unchanged pulm aeration with bil airspace dz/edema, pleural effusions and atx  ASSESSMENT /  PLAN:  PULMONARY A: Acute Respiratory Failure - in setting of acute oxycodone overdose.  ARDS ? Aspiration PNA Edema P:   -wean attempts cpap 5 ps 5, goal 2 hrs, escalate to ps 12-15 if needed -continue neg fluid balance, has tolerated well -pcxr in am -ATX, dramatic failure weaning, desaturation, consider thera bnronch -may need mucomyst  CARDIOVASCULAR A:  Cardiogenic shock - resolved - echo EF 35%, Periapical hypokinesis. Post VT arrest EKG 7/29 - Normal sinus rhythm, nonspecific T wave abnormality  P:  - BP meds initiated per cardiology -neg on cvvhd same goal -echo in 8 weeks  RENAL A:   Acute Kidney Injury - secondary to overdose, ? Prolonged down time Hyperkalemia - resolved Anion Gap Acidosis - lactic  Hypermagnesemia  P:   -CVVH per Renal, did well with neg 3.3 liters, continue -monitor BMP daily  GASTROINTESTINAL A:   Morbid Obesity Shock liver resolving, r/o admission tylenol toxicity (no level noted) Constipation - resolved, big BM noted 7/29 Hypoalbuminemia  Elevated LFTs - improving from admission, r/o shock liver vs tylenal P:   - Protonix - continue TF - colace regimen (on high dose fent) with dulcolax - dc lactulose - continue mucomyst until resolution lFT  HEMATOLOGIC A:   Macrocytic Anemia - ??ETOH abuse Thrombocytopenia - possibly due to hematoma in R groin region Leukocytosis  Unlikely HITT (timing does not support, doppler neg legs, other causes unlikely), dilutional / sepsis / MODS / liver dz from etoh? superfical thrombosis rt P:  - Folate/MVI/thiamine - scd left leg -elevation warm pack to sup thrombosis  INFECTIOUS A:   Rule out Aspiration PNA - not impressed P:  -maintain ceftriaxone, add stop date 8 days  ENDOCRINE A:   Concern for Metabolic Syndrome, HbA1c 5.6  P:   - SSI  NEUROLOGIC A:   Acute Encephalopathy - in setting of overdose, known oxy IR +/- other substances, head Ct neg ? ETOH Abuse R/o seizure  focus Component hepatic enceph? P:   - continue propofol - WUA - dc lactulose as BM noted  Today's summary: goal to neg again today, failed weaning attempts, may need thera bronch  I have personally obtained a history, examined the patient, evaluated laboratory and imaging results, formulated the assessment and plan and placed orders.  CRITICAL CARE: The patient is critically ill with multiple organ systems failure and requires high complexity decision making for assessment and support, frequent evaluation and titration of therapies, application of advanced monitoring technologies and extensive interpretation of multiple databases. Critical Care Time devoted to  patient care services described in this note is 35 minutes.   Leo Rod, PA-S  Mcarthur Rossetti. Tyson Alias, MD, FACP Pgr: [phone] [facility]

## 2013-01-04 NOTE — Progress Notes (Signed)
Subjective: Pt is now POD 9 s/p elective Achilles tendon reconstruction.  He is intubated and sedated in the ICU.  Events of the last 2 days noted.   Objective: Vital signs in last 24 hours: Temp:  [98.2 F (36.8 C)-99.2 F (37.3 C)] 98.6 F (37 C) (07/30 1600) Pulse Rate:  [91-109] 91 (07/30 1205) Resp:  [17-29] 18 (07/30 1800) BP: (113-148)/(51-89) 135/71 mmHg (07/30 1800) SpO2:  [94 %-100 %] 95 % (07/30 1800) FiO2 (%):  [30 %-40 %] 30 % (07/30 1800) Weight:  [130.9 kg (288 lb 9.3 oz)] 130.9 kg (288 lb 9.3 oz) (07/30 0400)  Intake/Output from previous day: 07/29 0701 - 07/30 0700 In: 3286.2 [I.V.:481.2; NG/GT:2755; IV Piggyback:50] Out: 6675 [Urine:61; Stool:580] Intake/Output this shift:     Recent Labs  01/03/13 0530 01/03/13 1150 01/04/13 0007 01/04/13 0405 01/04/13 1200  HGB 9.9* 10.3* 10.2* 10.7* 9.1*    Recent Labs  01/04/13 0405 01/04/13 1200  WBC 28.3* 22.8*  RBC 3.33* 2.81*  HCT 32.1* 26.9*  PLT 62* 53*    Recent Labs  01/04/13 1200 01/04/13 1600  NA 138 137  K 4.2 4.0  CL 99 99  CO2 23 24  BUN 67* 65*  CREATININE 3.62* 3.53*  GLUCOSE 144* 129*  CALCIUM 8.3* 8.5   No results found for this basename: LABPT, INR,  in the last 72 hours  PE:  R LE splinted.  Toes warm with brisk cap refill.  Assessment/Plan: Will continue to follow with critical care team.   Toni Arthurs 01/04/2013, 7:04 PM

## 2013-01-04 NOTE — Progress Notes (Addendum)
Subjective:  Patient remains intubated, sedated. Moves ext  Objective:  Vital Signs in the last 24 hours: Temp:  [98.2 F (36.8 C)-99.7 F (37.6 C)] 98.8 F (37.1 C) (07/30 0400) Pulse Rate:  [88-107] 107 (07/30 0335) Resp:  [14-29] 24 (07/30 0700) BP: (105-154)/(43-80) 142/78 mmHg (07/30 0700) SpO2:  [95 %-100 %] 99 % (07/30 0700) Arterial Line BP: (122-157)/(63-75) 122/63 mmHg (07/29 1800) FiO2 (%):  [40 %] 40 % (07/30 0400) Weight:  [288 lb 9.3 oz (130.9 kg)] 288 lb 9.3 oz (130.9 kg) (07/30 0400)  Intake/Output from previous day: 07/29 0701 - 07/30 0700 In: 3286.2 [I.V.:481.2; NG/GT:2755; IV Piggyback:50] Out: 0981 [Urine:61; Stool:580] Intake/Output from this shift:    . acetylcysteine  70 mg/kg Per Tube Q4H  . antiseptic oral rinse  15 mL Mouth Rinse QID  . cefTRIAXone (ROCEPHIN)  IV  1 g Intravenous Q24H  . chlorhexidine  15 mL Mouth Rinse BID  . darbepoetin (ARANESP) injection - NON-DIALYSIS  150 mcg Subcutaneous Q Mon-1800  . feeding supplement  60 mL Oral 5 X Daily  . hydrALAZINE  10 mg Oral Q6H  . insulin aspart  1 Units Subcutaneous Q4H  . insulin aspart  1-3 Units Subcutaneous Q4H  . insulin glargine  10 Units Subcutaneous Q24H  . isosorbide dinitrate  10 mg Per Tube TID  . metroNIDAZOLE  500 mg Per Tube Q8H  . pantoprazole sodium  40 mg Per Tube Q1200  . banana bag IV 1000 mL   Intravenous Once  . sodium chloride  10-40 mL Intracatheter Q12H  . sodium polystyrene  30 g Per Tube Once   . sodium chloride Stopped (01/01/13 1400)  . sodium chloride Stopped (12/31/12 2300)  . dextrose    . feeding supplement (PROMOTE) 1,000 mL (01/03/13 2000)  . fentaNYL infusion INTRAVENOUS 100 mcg/hr (01/04/13 1914)  . dialysis replacement fluid (prismasate) 500 mL/hr at 01/03/13 2258  . dialysis replacement fluid (prismasate) 300 mL/hr at 01/04/13 0236  . dialysate (PRISMASATE) 1,500 mL/hr at 01/04/13 0634  . propofol 10 mcg/kg/min (01/03/13 2210)    Physical  Exam: The patient is sedated on vent. Diffuse anasarca but improved  Neck with dialysis and central venous catheter  Chest diffuse rhonchi  Heart sounds are very distant. No murmur gallop or rub.  Abd mildly distended, no masses Scrotal edema  Extremities reveal 1 + edema  Neurologic exam not testable.   Lab Results:  Recent Labs  01/04/13 0007 01/04/13 0405  WBC 25.6* 28.3*  HGB 10.2* 10.7*  PLT 59* 62*    Recent Labs  01/04/13 0007 01/04/13 0405  NA 137 137  K 4.2 4.3  CL 97 97  CO2 24 25  GLUCOSE 119* 99  BUN 58* 60*  CREATININE 3.15* 3.29*     Recent Labs  01/04/13 0405  PROT 6.7  ALBUMIN 2.3*  AST 117*  ALT 803*  ALKPHOS 237*  BILITOT 1.0  BILIDIR 0.6*  IBILI 0.4      Cardiac Studies: Telemetry shows NSR.   Assessment/Plan:  1. Cardiogenic shock- improved; nonobstructive CAD by cath. Severe LV dsyfunction. Continue hydralazine/nitrates (change hydralazine to 25 mg Q 8); no ACEI for now given acute renal insuff. Add coreg 3.125 mg BID. Increase meds as tolerated by pulse and BP; repeat echo in 8 weeks to see if LV function has improved. 2. S/P respiratory and cardiac arrest-etiology of events at time of admission still not entirely clear; felt to most likely be respiratory arrest related to  sedatives and OSA. We were not able to completely rule out pulmonary embolus. Lower ext dopplers neg. Heparin DCed given acute blood loss anemia and inability to transfuse ([employer]ehovah's witness) as well as thrombocytopenia. If he improves, will consider VQ scan following extubation. No CT given renal insuff.  3. Acute renal failure-most likely ATN from cardiac arrest/cardiogenic shock; also probable contribution from contrast; patient markedly volume overloaded but improving with CVVHD; little urine output; nephrology managing. 4. VDRF-management per CCM 5. Acute blood loss anemia-patient is Jehovah's witness. 6. Thrombocytopenia-Plt unchanged this AM; HIT  pending. 7. Pneumonia-continue antibiotics.            LOS: 6 days    Nicholas Potts 01/04/2013, 7:20 AM

## 2013-01-04 NOTE — Progress Notes (Signed)
[facility] Rounding Note  S: Duplex arms SVT right arm no communic w/deep system CRRT pulling - 150/hour and tolerating well but filter has clotted - re-priming RN says to have bronch today b/c unable to wean  O:BP 130/51  Pulse 108  Temp(Src) 99 F (37.2 C) (Oral)  Resp 24  Ht 6' (1.829 m)  Wt 130.9 kg (288 lb 9.3 oz)  BMI 39.13 kg/m2  SpO2 98%  Intake/Output Summary (Last 24 hours) at 01/04/13 1111 Last data filed at 01/04/13 1000  Gross per 24 hour  Intake 3207.7 ml  Output   6920 ml  Net -3712.3 ml   Weight change: -5.7 kg (-12 lb 9.1 oz) ZOX:WRUEAVWUJ, sedated  right IJ catheter (7/26) WJX:BJYNWGN 80's Resp:clear anteriorly  Abd:+ BS obese, mild distension, very tight and firm No obvious pain with palpation Ext:2+ edema right leg splinted NEURO:sedated Minimal amt dark muddy brown urine in foley (30 cc/24 hours) Massive scrotal swelling Ecchymotic left groin  Large amount liquid stool in flexiseal  WEIGHTS: 01/04/13 0400 130.9 kg  01/03/13 0500 136.6 kg  (incr neg 150/hr w/CRRT) 01/02/13 0500 138.5 kg  (incr neg 100/hr w/CRRT) 01/01/13 0441 141 kg   12/31/12 0500 141.4 kg   12/30/12 0500 135.8 kg   (CRRT started neg 50/hour) 12/29/12 2300 133.3 kg   12/29/12 1708 121.564 kg   MEDICATIONS  . acetylcysteine  70 mg/kg Per Tube Q4H  . antiseptic oral rinse  15 mL Mouth Rinse QID  . carvedilol  3.125 mg Per NG tube BID WC  . cefTRIAXone (ROCEPHIN)  IV  1 g Intravenous Q24H  . chlorhexidine  15 mL Mouth Rinse BID  . darbepoetin (ARANESP) injection - NON-DIALYSIS  150 mcg Subcutaneous Q Mon-1800  . feeding supplement  60 mL Oral 5 X Daily  . hydrALAZINE  25 mg Oral Q8H  . insulin aspart  1 Units Subcutaneous Q4H  . insulin aspart  1-3 Units Subcutaneous Q4H  . insulin glargine  10 Units Subcutaneous Q24H  . isosorbide dinitrate  10 mg Per Tube TID  . metroNIDAZOLE  500 mg Per Tube Q8H  . pantoprazole sodium  40 mg Per Tube Q1200  . banana  bag IV 1000 mL   Intravenous Once  . sodium chloride  10-40 mL Intracatheter Q12H  . sodium polystyrene  30 g Per Tube Once   . sodium chloride Stopped (01/01/13 1400)  . sodium chloride Stopped (12/31/12 2300)  . dextrose    . feeding supplement (PROMOTE) 1,000 mL (01/03/13 2000)  . fentaNYL infusion INTRAVENOUS 100 mcg/hr (01/04/13 0745)  . dialysis replacement fluid (prismasate) 500 mL/hr at 01/04/13 0900  . dialysis replacement fluid (prismasate) 300 mL/hr at 01/04/13 0236  . dialysate (PRISMASATE) 1,500 mL/hr at 01/04/13 1000  . propofol 20 mcg/kg/min (01/04/13 1000)   Basic Metabolic Panel:  Recent Labs  56/21/30 0530  01/03/13 1610 01/04/13 0007 01/04/13 0405  NA 136  < > 137 137 137  K 4.4  < > 4.0 4.2 4.3  CL 102  < > 101 97 97  CO2 25  < > 23 24 25   GLUCOSE 95  < > 154* 119* 99  BUN 59*  < > 62* 58* 60*  CREATININE 3.53*  < > 3.34* 3.15* 3.29*  CALCIUM 8.1*  < > 7.9* 8.3* 8.6  MG 2.9*  --   --   --  3.1*  PHOS 3.6  --  3.6  --   --   < > =  values in this interval not displayed.  Recent Labs  01/03/13 0530 01/03/13 1610 01/04/13 0405  AST 244*  --  117*  ALT 1183*  --  803*  ALKPHOS 215*  --  237*  BILITOT 1.6*  --  1.0  PROT 5.9*  --  6.7  ALBUMIN 2.2* 1.9* 2.3*   CBC:  Recent Labs  01/03/13 0530  01/04/13 0007 01/04/13 0405  WBC 22.8*  < > 25.6* 28.3*  NEUTROABS 18.0*  --   --   --   HGB 9.9*  < > 10.2* 10.7*  HCT 31.4*  < > 30.8* 32.1*  MCV 99.1  < > 97.2 96.4  PLT 58*  < > 59* 62*  < > = values in this interval not displayed. Anemia Panel:  Recent Labs  01/02/13 1115  FERRITIN 6946*  TIBC 173*  IRON 36*   Results for Nicholas Potts, Nicholas Potts (MRN [id number]) as of 01/03/2013 10:07  Ref. Range 12/31/2012 05:00 01/03/2013 05:30  AST Latest Range: 0-37 U/L 6396 (H) 244 (H)  ALT Latest Range: 0-53 U/L 3909 (H) 1183 (H)  Total Protein Latest Range: 6.0-8.3 g/dL 4.5 (L) 5.9 (L)  Bilirubin, Direct Latest Range: 0.0-0.3 mg/dL 1.3 (H)   Indirect  Bilirubin Latest Range: 0.3-0.9 mg/dL 0.9   Total Bilirubin Latest Range: 0.3-1.2 mg/dL 2.2 (H) 1.6 (H)   Dg Chest Port 1 View  01/04/2013   *RADIOLOGY REPORT*  Clinical Data: Evaluate endotracheal tube position.  Infiltrate and atelectasis.  PORTABLE CHEST - 1 VIEW  Comparison: 01/03/2013.  Findings: Endotracheal tube tip is 28 mm from the carina.  Right IJ vas cath.  Enteric tube is present with the tip not visualized. There is probably a loop of the enteric tube in the proximal fundus of the stomach.  Markedly low lung volumes remain present with basilar atelectasis and diffuse bilateral basilar predominant airspace disease most compatible with pulmonary edema.  Bilateral pleural effusions are also present.  Left subclavian central line appears similar with the tip in the mid-to-lower SVC.  IMPRESSION:  1.  Endotracheal tube tip 28 mm from the carina. 2.  Other support apparatus unchanged. 3.  Unchanged pulmonary aeration with bilateral airspace disease/edema, pleural effusions and atelectasis.   Original Report Authenticated By: Andreas Newport, M.D.   Dg Chest Port 1 View  01/03/2013   *RADIOLOGY REPORT*  Clinical Data: Endotracheal tube placement  PORTABLE CHEST - 1 VIEW  Comparison: Prior chest x-ray 01/02/2013  Findings: The tip of the endotracheal tube is 1.5 cm above the carina.  The patient is in a kyphotic position.  The right IJ temporary hemodialysis catheter tip projects over the distal SVC. The tip of the left subclavian approach central venous catheter projects over the superior cavoatrial junction. The nasogastric tube can be seen coiled within the gastric fundus.  Inspiratory volumes are very low.  There is mild pulmonary edema and diffuse bilateral interstitial and airspace opacities.  Probable bilateral layering effusions.  Stable cardiomegaly.  IMPRESSION:  1.  The tip of the endotracheal tube is 1.5 cm above the carina. Relatively distal placement of the endotracheal tube likely  exaggerated by the patient is kyphotic positioning. 2.  Other support apparatus in stable and satisfactory position. 3.  Similar appearance of the lungs with very low inspiratory volumes, edema, bilateral layering effusions and infiltrate versus atelectasis.   Original Report Authenticated By: Malachy Moan, M.D.   Assessment/Recommendations  52 yo male SP Rt ankle surgery 7/20 who presented to ER with AMS  thought to be due to narcotic usage,  coded 3 times in the ED,  taken emergently to cath lab and had Rt and Lt heart cath(nonobstructive ds/EF 20%) and placement of impella device. Scr 1.76 on adm with unknown baseline, with development of ischemic ATN requiring initiation of CRRT (7/25),   Acute vs acute on chronic renal failure (baseline creatinine not known) sec to ischemic ATN from hypotension/cardiogenic shock +/- IV contrast.   Oliguric No pressors now Impella device out.  Still 10 kg above adm weight  Continue CRRT (started 7/25) at neg 150/hour No heparin d/t Jehovah Witness status Added aranesp 150/week for anemia (7/28)since cannot transfuse  Iron low but will not replete with ferritin of nearly 7000 K and phos continue to look fine  Jarrick Fjeld B

## 2013-01-05 ENCOUNTER — Inpatient Hospital Stay (HOSPITAL_COMMUNITY): Payer: BC Managed Care – PPO

## 2013-01-05 LAB — HEPARIN INDUCED THROMBOCYTOPENIA PNL
UFH High Dose UFH H: 0 % Release
UFH Low Dose 0.1 IU/mL: 0 % Release
UFH Low Dose 0.5 IU/mL: 0 % Release
UFH SRA Result: NEGATIVE

## 2013-01-05 LAB — COMPREHENSIVE METABOLIC PANEL
AST: 62 U/L — ABNORMAL HIGH (ref 0–37)
Albumin: 2.2 g/dL — ABNORMAL LOW (ref 3.5–5.2)
Alkaline Phosphatase: 242 U/L — ABNORMAL HIGH (ref 39–117)
BUN: 65 mg/dL — ABNORMAL HIGH (ref 6–23)
Chloride: 99 mEq/L (ref 96–112)
Creatinine, Ser: 3.4 mg/dL — ABNORMAL HIGH (ref 0.50–1.35)
Potassium: 4.4 mEq/L (ref 3.5–5.1)
Total Bilirubin: 0.8 mg/dL (ref 0.3–1.2)
Total Protein: 6.5 g/dL (ref 6.0–8.3)

## 2013-01-05 LAB — CBC
MCH: 31.6 pg (ref 26.0–34.0)
MCHC: 33 g/dL (ref 30.0–36.0)
MCV: 95.6 fL (ref 78.0–100.0)
MCV: 96 fL (ref 78.0–100.0)
Platelets: 57 10*3/uL — ABNORMAL LOW (ref 150–400)
Platelets: 71 10*3/uL — ABNORMAL LOW (ref 150–400)
RBC: 3.15 MIL/uL — ABNORMAL LOW (ref 4.22–5.81)
RDW: 15.2 % (ref 11.5–15.5)
RDW: 15.3 % (ref 11.5–15.5)
WBC: 26.2 10*3/uL — ABNORMAL HIGH (ref 4.0–10.5)

## 2013-01-05 LAB — BLOOD GAS, ARTERIAL

## 2013-01-05 LAB — BASIC METABOLIC PANEL
BUN: 64 mg/dL — ABNORMAL HIGH (ref 6–23)
CO2: 23 mEq/L (ref 19–32)
Calcium: 8.4 mg/dL (ref 8.4–10.5)
Creatinine, Ser: 3.32 mg/dL — ABNORMAL HIGH (ref 0.50–1.35)
GFR calc non Af Amer: 20 mL/min — ABNORMAL LOW (ref >90)
Glucose, Bld: 115 mg/dL — ABNORMAL HIGH (ref 70–99)
Sodium: 140 mEq/L (ref 135–145)

## 2013-01-05 LAB — RENAL FUNCTION PANEL
Albumin: 2.1 g/dL — ABNORMAL LOW (ref 3.5–5.2)
BUN: 66 mg/dL — ABNORMAL HIGH (ref 6–23)
Creatinine, Ser: 3.21 mg/dL — ABNORMAL HIGH (ref 0.50–1.35)
Phosphorus: 4.8 mg/dL — ABNORMAL HIGH (ref 2.3–4.6)

## 2013-01-05 LAB — GLUCOSE, CAPILLARY
Glucose-Capillary: 127 mg/dL — ABNORMAL HIGH (ref 70–99)
Glucose-Capillary: 106 mg/dL — ABNORMAL HIGH (ref 70–99)

## 2013-01-05 LAB — POCT I-STAT 3, ART BLOOD GAS (G3+)
Acid-base deficit: 2 mmol/L (ref 0.0–2.0)
Bicarbonate: 23.1 mEq/L (ref 20.0–24.0)
Patient temperature: 98.6

## 2013-01-05 LAB — MAGNESIUM: Magnesium: 3.1 mg/dL — ABNORMAL HIGH (ref 1.5–2.5)

## 2013-01-05 LAB — PHOSPHORUS: Phosphorus: 4 mg/dL (ref 2.3–4.6)

## 2013-01-05 MED ORDER — PROMOTE PO LIQD
1000.0000 mL | ORAL | Status: DC
  Administered 2013-01-05 – 2013-01-07 (×3): 1000 mL
  Filled 2013-01-05 (×3): qty 1000

## 2013-01-05 NOTE — Progress Notes (Signed)
Subjective:  Patient remains intubated, sedated.   Objective:  Vital Signs in the last 24 hours: Temp:  [97.8 F (36.6 C)-99.6 F (37.6 C)] 97.8 F (36.6 C) (07/31 0800) Pulse Rate:  [86-108] 96 (07/31 0326) Resp:  [18-24] 24 (07/31 0326) BP: (98-138)/(51-80) 120/61 mmHg (07/31 0800) SpO2:  [93 %-100 %] 97 % (07/31 0800) FiO2 (%):  [30 %-40 %] 40 % (07/31 0800) Weight:  [280 lb 3.3 oz (127.1 kg)] 280 lb 3.3 oz (127.1 kg) (07/31 0500)  Intake/Output from previous day: 07/30 0701 - 07/31 0700 In: 2160.8 [I.V.:700.8; NG/GT:1410; IV Piggyback:50] Out: 5435 [Urine:61; Stool:1000] Intake/Output from this shift: Total I/O In: -  Out: 213 [Other:213]  . acetylcysteine  70 mg/kg Per Tube Q4H  . antiseptic oral rinse  15 mL Mouth Rinse QID  . carvedilol  3.125 mg Per NG tube BID WC  . cefTRIAXone (ROCEPHIN)  IV  1 g Intravenous Q24H  . chlorhexidine  15 mL Mouth Rinse BID  . darbepoetin (ARANESP) injection - NON-DIALYSIS  150 mcg Subcutaneous Q Mon-1800  . feeding supplement  60 mL Oral 5 X Daily  . hydrALAZINE  25 mg Oral Q8H  . insulin aspart  1 Units Subcutaneous Q4H  . insulin aspart  1-3 Units Subcutaneous Q4H  . insulin glargine  10 Units Subcutaneous Q24H  . isosorbide dinitrate  10 mg Per Tube TID  . metroNIDAZOLE  500 mg Per Tube Q8H  . pantoprazole sodium  40 mg Per Tube Q1200  . banana bag IV 1000 mL   Intravenous Once  . sodium chloride  10-40 mL Intracatheter Q12H  . sodium polystyrene  30 g Per Tube Once   . sodium chloride Stopped (01/01/13 1400)  . sodium chloride Stopped (12/31/12 2300)  . dextrose    . feeding supplement (PROMOTE) 1,000 mL (01/03/13 2000)  . fentaNYL infusion INTRAVENOUS 100 mcg/hr (01/05/13 0647)  . dialysis replacement fluid (prismasate) 500 mL/hr at 01/05/13 0745  . dialysis replacement fluid (prismasate) 300 mL/hr at 01/04/13 2111  . dialysate (PRISMASATE) 1,500 mL/hr at 01/05/13 0109  . propofol 20 mcg/kg/min (01/05/13 0800)     Physical Exam: The patient is sedated on vent. Diffuse anasarca but continues to improve  Neck with dialysis and central venous catheter  Chest diffuse rhonchi  Heart sounds are very distant. No murmur gallop or rub.  Abd mildly distended, no masses Scrotal edema improving  Extremities reveal 1 + edema  Neurologic exam not testable.   Lab Results:  Recent Labs  01/04/13 2330 01/05/13 0500  WBC 24.5* 26.2*  HGB 9.4* 10.1*  PLT 57* 71*    Recent Labs  01/04/13 2330 01/05/13 0500  NA 140 141  K 4.1 4.4  CL 101 99  CO2 23 23  GLUCOSE 115* 96  BUN 64* 65*  CREATININE 3.32* 3.40*     Recent Labs  01/04/13 0405  01/05/13 0500  PROT 6.7  --  6.5  ALBUMIN 2.3*  < > 2.2*  AST 117*  --  62*  ALT 803*  --  477*  ALKPHOS 237*  --  242*  BILITOT 1.0  --  0.8  BILIDIR 0.6*  --   --   IBILI 0.4  --   --   < > = values in this interval not displayed.    Cardiac Studies: Telemetry shows NSR.   Assessment/Plan:  1. Cardiogenic shock- improved; nonobstructive CAD by cath. Severe LV dsyfunction. Continue hydralazine/nitrates; no ACEI for now given acute  renal insuff. Continue coreg 3.125 mg BID. Increase meds as tolerated by pulse and BP; repeat echo in 8 weeks to see if LV function has improved. 2. S/P respiratory and cardiac arrest-felt to most likely be respiratory arrest related to sedatives and OSA followed by aspiration. We were not able to completely rule out pulmonary embolus. Lower ext dopplers neg. Heparin DCed given acute blood loss anemia and inability to transfuse (Jehovah's witness) as well as thrombocytopenia.  3. Acute renal failure-most likely ATN from cardiac arrest/cardiogenic shock; also probable contribution from contrast; patient remains volume overloaded but improving with CVVHD; little urine output; nephrology managing. 4. VDRF-management per CCM 5. Acute blood loss anemia-patient is Jehovah's witness. 6. Thrombocytopenia-Plt unchanged this  AM; HIT pending. 7. Pneumonia-continue antibiotics.            LOS: 7 days    Olga Millers 01/05/2013, 8:54 AM

## 2013-01-05 NOTE — Progress Notes (Signed)
NUTRITION FOLLOW UP  Intervention:   1.  Enteral nutrition; decrease Promote to 20 mL/hr and continue Prostat 60 mL (2 packets) 5 times daily to provide 1480 kcal, 180g protein, 402 mL free water.   Nutrition provided by TF + propofol:  2097 kcal, 180g protein, 402 mL free water.   2.  Nutrition-related medications; consider bowel regimen such as lomotil.   Nutrition Dx:   Inadequate oral intake, ongoing  Monitor:   1. Enteral nutrition; initiation with tolerance. Enteral nutrition to provide 60-70% of estimated calorie needs (22-25 kcals/kg ideal body weight) and 100% of estimated protein needs, based on ASPEN guidelines for permissive underfeeding in critically ill obese individuals. Met. 2. Wt/wt change; monitor trends.  Ongoing.  Assessment:   Pt with recent Achilles tendon surgery admitted with AMS; etiology unclear.  Developed PEA arrest requiring CPR and intubation. Pt now with cardiogenic shock, acute kidney injury, and shock liver.  Requiring CVVHD. Patient is currently intubated on ventilator support.  MV: 12.3 L/min Temp:Temp (24hrs), Avg:98.7 F (37.1 C), Min:97.8 F (36.6 C), Max:99.6 F (37.6 C)  Propofol: 23.4 ml/hr currently providing 617 kcal/d Discussed with RN.  After receiving bowel regimen earlier this week, pt has had liquid stools.  Now with rectal tube.  Pt also with abdominal edema.  Pt with significant output- 1000 mL stool output yesterday which is excessive and not proportionate to input of 840 mL daily via TF.  Continues on antibiotics.  Will make adjustments based on propofol rate.   Note change in BMI with fluid loss.   Height: Ht Readings from Last 1 Encounters:  12/29/12 6' (1.829 m)    Weight Status:   Wt Readings from Last 1 Encounters:  01/05/13 280 lb 3.3 oz (127.1 kg)   Re-estimated needs:  Kcal: 2612 Permissive underfeeding goals:  1780-2020 Protein: >/=177g Fluid: ~2.4 L/day or per MD discretion  Skin: generalized edema  Diet  Order:  NPO   Intake/Output Summary (Last 24 hours) at 01/05/13 1117 Last data filed at 01/05/13 1100  Gross per 24 hour  Intake 2203.05 ml  Output   5182 ml  Net -2978.95 ml    Last BM: 7/31, rectal tube   Labs:   Recent Labs Lab 01/03/13 0530  01/03/13 1610  01/04/13 0405  01/04/13 1600 01/04/13 2330 01/05/13 0500  NA 136  < > 137  < > 137  < > 137 140 141  K 4.4  < > 4.0  < > 4.3  < > 4.0 4.1 4.4  CL 102  < > 101  < > 97  < > 99 101 99  CO2 25  < > 23  < > 25  < > 24 23 23   BUN 59*  < > 62*  < > 60*  < > 65* 64* 65*  CREATININE 3.53*  < > 3.34*  < > 3.29*  < > 3.53* 3.32* 3.40*  CALCIUM 8.1*  < > 7.9*  < > 8.6  < > 8.5 8.4 8.7  MG 2.9*  --   --   --  3.1*  --   --   --  3.1*  PHOS 3.6  --  3.6  --   --   --  3.4  --  4.0  GLUCOSE 95  < > 154*  < > 99  < > 129* 115* 96  < > = values in this interval not displayed.  CBG (last 3)   Recent Labs  01/04/13 2311  01/05/13 0308 01/05/13 0750  GLUCAP 104* 127* 144*    Scheduled Meds: . antiseptic oral rinse  15 mL Mouth Rinse QID  . carvedilol  3.125 mg Per NG tube BID WC  . chlorhexidine  15 mL Mouth Rinse BID  . darbepoetin (ARANESP) injection - NON-DIALYSIS  150 mcg Subcutaneous Q Mon-1800  . feeding supplement  60 mL Oral 5 X Daily  . hydrALAZINE  25 mg Oral Q8H  . insulin aspart  1 Units Subcutaneous Q4H  . insulin aspart  1-3 Units Subcutaneous Q4H  . insulin glargine  10 Units Subcutaneous Q24H  . isosorbide dinitrate  10 mg Per Tube TID  . metroNIDAZOLE  500 mg Per Tube Q8H  . pantoprazole sodium  40 mg Per Tube Q1200  . banana bag IV 1000 mL   Intravenous Once  . sodium chloride  10-40 mL Intracatheter Q12H  . sodium polystyrene  30 g Per Tube Once    Continuous Infusions: . sodium chloride Stopped (01/01/13 1400)  . sodium chloride Stopped (12/31/12 2300)  . dextrose    . feeding supplement (PROMOTE) 1,000 mL (01/03/13 2000)  . fentaNYL infusion INTRAVENOUS 100 mcg/hr (01/05/13 0800)  .  dialysis replacement fluid (prismasate) 500 mL/hr at 01/05/13 0745  . dialysis replacement fluid (prismasate) 300 mL/hr at 01/04/13 2111  . dialysate (PRISMASATE) 1,500 mL/hr at 01/05/13 0109  . propofol 30 mcg/kg/min (01/05/13 0830)    Loyce Dys, MS RD LDN Clinical Inpatient Dietitian Pager: [phone] Weekend/After hours pager: [phone]

## 2013-01-05 NOTE — Progress Notes (Signed)
[facility] Rounding Note  Subjective: CRRT pulling - 150/hour and tolerating well  BP remains stable off pressors and dobutamine Bronch yesterday see note Moves ext's and nods, grips during wakeup eval  O:BP 102/61  Pulse 96  Temp(Src) 99.6 F (37.6 C) (Oral)  Resp 24  Ht 6' (1.829 m)  Wt 127.1 kg (280 lb 3.3 oz)  BMI 37.99 kg/m2  SpO2 100%  Intake/Output Summary (Last 24 hours) at 01/05/13 0823 Last data filed at 01/05/13 0800  Gross per 24 hour  Intake 2084.35 ml  Output   5456 ml  Net -3371.65 ml   Weight change: -3.8 kg (-8 lb 6 oz) ZOX:WRUEAVWUJ, sedated right IJ catheter (7/26) intact WJX:BJYNWGN 80's Resp:clear anteriorly  Abd:+ BS obese, mild distension, very tight and firm No obvious pain with palpation Ext:1+ edema right leg splinted NEURO:when sedation down - nods, grips, moves UE's Minimal amt dark muddy brown urine in foley Marked scrotal swelling but somewhat better Ecchymotic left groin with dressings in place (prior Impella device) Large amount liquid stool in flexiseal  WEIGHTS: 01/05/13 0500 127.1 kg  01/04/13 0400 130.9 kg  01/03/13 0500 136.6 kg  (incr neg 150/hr w/CRRT) 01/02/13 0500 138.5 kg  (incr neg 100/hr w/CRRT) 01/01/13 0441 141 kg   12/31/12 0500 141.4 kg   12/30/12 0500 135.8 kg   (CRRT started neg 50/hour) 12/29/12 2300 133.3 kg   12/29/12 1708 121.564 kg   MEDICATIONS  . acetylcysteine  70 mg/kg Per Tube Q4H  . antiseptic oral rinse  15 mL Mouth Rinse QID  . carvedilol  3.125 mg Per NG tube BID WC  . cefTRIAXone (ROCEPHIN)  IV  1 g Intravenous Q24H  . chlorhexidine  15 mL Mouth Rinse BID  . darbepoetin (ARANESP) injection - NON-DIALYSIS  150 mcg Subcutaneous Q Mon-1800  . feeding supplement  60 mL Oral 5 X Daily  . hydrALAZINE  25 mg Oral Q8H  . insulin aspart  1 Units Subcutaneous Q4H  . insulin aspart  1-3 Units Subcutaneous Q4H  . insulin glargine  10 Units Subcutaneous Q24H  . isosorbide dinitrate  10 mg  Per Tube TID  . metroNIDAZOLE  500 mg Per Tube Q8H  . pantoprazole sodium  40 mg Per Tube Q1200  . banana bag IV 1000 mL   Intravenous Once  . sodium chloride  10-40 mL Intracatheter Q12H  . sodium polystyrene  30 g Per Tube Once   . sodium chloride Stopped (01/01/13 1400)  . sodium chloride Stopped (12/31/12 2300)  . dextrose    . feeding supplement (PROMOTE) 1,000 mL (01/03/13 2000)  . fentaNYL infusion INTRAVENOUS 100 mcg/hr (01/05/13 0647)  . dialysis replacement fluid (prismasate) 500 mL/hr at 01/05/13 0745  . dialysis replacement fluid (prismasate) 300 mL/hr at 01/04/13 2111  . dialysate (PRISMASATE) 1,500 mL/hr at 01/05/13 0109  . propofol 20 mcg/kg/min (01/05/13 0800)   Basic Metabolic Panel:  Recent Labs  56/21/30 0405  01/04/13 1600 01/04/13 2330 01/05/13 0500  NA 137  < > 137 140 141  K 4.3  < > 4.0 4.1 4.4  CL 97  < > 99 101 99  CO2 25  < > 24 23 23   GLUCOSE 99  < > 129* 115* 96  BUN 60*  < > 65* 64* 65*  CREATININE 3.29*  < > 3.53* 3.32* 3.40*  CALCIUM 8.6  < > 8.5 8.4 8.7  MG 3.1*  --   --   --  3.1*  PHOS  --   --  3.4  --  4.0  < > = values in this interval not displayed.  Recent Labs  01/04/13 0405 01/04/13 1600 01/05/13 0500  AST 117*  --  62*  ALT 803*  --  477*  ALKPHOS 237*  --  242*  BILITOT 1.0  --  0.8  PROT 6.7  --  6.5  ALBUMIN 2.3* 2.1* 2.2*     Recent Labs  01/03/13 0530  01/04/13 2330 01/05/13 0500  WBC 22.8*  < > 24.5* 26.2*  NEUTROABS 18.0*  --   --   --   HGB 9.9*  < > 9.4* 10.1*  HCT 31.4*  < > 28.5* 30.1*  MCV 99.1  < > 96.0 95.6  PLT 58*  < > 57* 71*  < > = values in this interval not displayed.    Recent Labs  01/02/13 1115  FERRITIN 6946*  TIBC 173*  IRON 36*    ABG    Component Value Date/Time   PHART 7.353 01/05/2013 0508   PCO2ART 41.6 01/05/2013 0508   PO2ART 67.0* 01/05/2013 0508   HCO3 23.1 01/05/2013 0508   TCO2 24 01/05/2013 0508   ACIDBASEDEF 2.0 01/05/2013 0508   O2SAT 92.0 01/05/2013 0508      Dg Chest Port 1 View  01/05/2013   *RADIOLOGY REPORT*  Clinical Data: Atelectasis.  PORTABLE CHEST - 1 VIEW  Comparison: 01/04/2013.  Findings: The support apparatus is unchanged.  Endotracheal tube tip is 29 mm from the carina.  Pulmonary aeration is improved compared yesterday's exam with decreasing basilar atelectasis. Positional shift in the pleural effusions.  Pulmonary vascular congestion persists.  Cardiopericardial silhouette grossly appears unchanged.  IMPRESSION:  1.  Stable support apparatus. 2.  Improving pulmonary aeration with positional shift in the pleural effusions.  Overall low volume chest.   Original Report Authenticated By: Andreas Newport, M.D.   Dg Chest Port 1 View  01/04/2013   *RADIOLOGY REPORT*  Clinical Data: Evaluate endotracheal tube position.  Infiltrate and atelectasis.  PORTABLE CHEST - 1 VIEW  Comparison: 01/03/2013.  Findings: Endotracheal tube tip is 28 mm from the carina.  Right IJ vas cath.  Enteric tube is present with the tip not visualized. There is probably a loop of the enteric tube in the proximal fundus of the stomach.  Markedly low lung volumes remain present with basilar atelectasis and diffuse bilateral basilar predominant airspace disease most compatible with pulmonary edema.  Bilateral pleural effusions are also present.  Left subclavian central line appears similar with the tip in the mid-to-lower SVC.  IMPRESSION:  1.  Endotracheal tube tip 28 mm from the carina. 2.  Other support apparatus unchanged. 3.  Unchanged pulmonary aeration with bilateral airspace disease/edema, pleural effusions and atelectasis.   Original Report Authenticated By: Andreas Newport, M.D.   Assessment/Recommendations  52 yo male SP Rt ankle surgery 7/20 who presented to ER with AMS thought to be due to narcotic usage,  coded 3 times in the ED,  taken emergently to cath lab and had Rt and Lt heart cath(nonobstructive ds/EF 20%) and placement of impella device (now removed). Scr  1.76 on adm with unknown baseline, with development of ischemic ATN requiring initiation of CRRT (7/25)   Acute vs acute on chronic renal failure (baseline creatinine not known) sec to ischemic ATN from hypotension/cardiogenic shock +/- IV contrast.   Oliguric No pressors or inotropes now Impella device out.  Still several kg above adm weight; CXR improving but still with congestion/effusions and persistent but  improving anasarca on physical  Continue CRRT (started 7/25) at neg 150/hour  All 4K fluids No heparin d/t Jehovah Witness status Added aranesp 150/week for anemia (7/28)since cannot transfuse and Hb improving  Iron low but will not replete with ferritin of nearly 7000 K and phos continue to look fine  Gracelin Weisberg B

## 2013-01-05 NOTE — Progress Notes (Signed)
Placed back on rest mode d/t RN reports that Dr. Tyson Alias wants pt to wean a couple of hours in the morning.  PER MD note, goal was to wean 2 hours. Pt was in no resp distress on wean.  Pt tol change to [facility] well. VVS Sat 97%.

## 2013-01-05 NOTE — Progress Notes (Signed)
PULMONARY  / CRITICAL CARE MEDICINE  Name: Nicholas Potts MRN: [id number] DOB: 09-07-1960    ADMISSION DATE:  12/29/2012  REFERRING MD :  EDP- Dr. Dan Humphreys PRIMARY SERVICE: PCCM  CHIEF COMPLAINT:  Acute Overdose  BRIEF PATIENT DESCRIPTION: [name] who presented to [facility] ER on 7/24 with AMS related to oxycodone overdose.  Underwent R achilles tendon reconstruction 7/20 with RLE cast in place on admit.  COurse complicated by respiratory arrest initially, then full cardiac arrest. He had episodes of ventricular tachycardia and was defibrillated . He had CPR with return of spontaneous circulation. Emergent cath >> EF 20%, mild non obstructive CAD. Required placement of Impella device for cardiogenic shock  SIGNIFICANT EVENTS / STUDIES:  7/20 - R ankle surgery (? Type, no [facility] Records) ........................................................................................................ 7/24 - Admit to [facility] with ER with acute oxy IR OD 7/25 duplex neg DVT 7/26- impella out 7/28-Dobutamine 7/28 eeg- This EEG is abnormal with moderately severe generalized nonspecific slowing of cerebral activity, which can be seen with a wide variety of encephalopathic processes. No epileptic activity was recorded.  7/29 - vent settings improved to peep 5, neg 2.5 liters cvvhd 7/30 upper ext doppler US - no obvious evidence of DVT of R or L upper ext, appears to be superficial thrombosis in R basilic and antecubital communicating veins 7/30 bronch - no significant secretions, no major distal plugging, BAL RLL, clear, white  LINES / TUBES: L [place] TLC 7/24>>> OETT 7/24>>> Aline 7/24>>>729 Femoral swan ganz 7/24>>>7/27 Impella device 7/24>>>7/26  CULTURES: Sputum 7/25>>>non-pathogenic oropharyngeal-type flora insolated (rare WBC present, mostly PMN) BCx2 7/24>>>ng UC 7/24>>>ng C. Diff 7/29>>>neg Bronch bal 7/30>>>  ANTIBIOTICS: Vanco 7/24>>>>7/28 Zosyn 7/2/4>>>7/28 Ceftriaxone 7/28>>>plan stop  7/31  SUBJECTIVE:  Patient intubated and sedated. No significant events overnight.   VITAL SIGNS: Temp:  [98.3 F (36.8 C)-99.6 F (37.6 C)] 99.6 F (37.6 C) (07/31 0300) Pulse Rate:  [86-109] 96 (07/31 0326) Resp:  [18-27] 24 (07/31 0326) BP: (98-148)/(51-89) 102/61 mmHg (07/31 0700) SpO2:  [93 %-100 %] 100 % (07/31 0700) FiO2 (%):  [30 %-40 %] 40 % (07/31 0700) Weight:  [280 lb 3.3 oz (127.1 kg)] 280 lb 3.3 oz (127.1 kg) (07/31 0500)  HEMODYNAMICS: CVP:  [4 mmHg-17 mmHg] 4 mmHg  VENTILATOR SETTINGS: Vent Mode:  [-] PRVC FiO2 (%):  [30 %-40 %] 40 % Set Rate:  [24 bmp] 24 bmp Vt Set:  [550 mL] 550 mL PEEP:  [5 cmH20] 5 cmH20 Pressure Support:  [12 cmH20] 12 cmH20 Plateau Pressure:  [18 cmH20-23 cmH20] 20 cmH20  INTAKE / OUTPUT: Intake/Output     07/30 0701 - 07/31 0700 07/31 0701 - 08/01 0700   I.V. (mL/kg) 700.8 (5.5)    NG/GT 1410    IV Piggyback 50    Total Intake(mL/kg) 2160.8 (17)    Urine (mL/kg/hr) 61 (0)    Other 4374 (1.4)    Stool 1000 (0.3)    Total Output 5435     Net -3274.3            PHYSICAL EXAMINATION: General: acutely ill  on vent, in NAD Neuro:  sedated on vent, some response to commands, RASS -1, tremors of upper extremity resolved HEENT: short thick neck Cardiovascular:  s1s2 distant, regular Lungs:  Coarse bil Abdomen:  Distended, Round/soft, bsx4 active Musculoskeletal:  No acute deformities, RLE with partial hard cast Skin:  Cool, dry, no edema, L groin region has large bruising at site of impella, scrotal enlargement about same  LABS:  Recent Labs Lab 12/29/12 1430  12/30/12 0037  12/30/12 0325 12/30/12 0349 12/30/12 0500  12/31/12 0500  12/31/12 1032  12/31/12 1430  01/03/13 0325 01/03/13 0530  01/03/13 1610  01/04/13 0400 01/04/13 0405 01/04/13 1200 01/04/13 1600 01/04/13 2330 01/05/13 0500 01/05/13 0508  HGB 16.3  --   --   < >  --   --   --   < > 12.4*  --   --   < >  --   < >  --  9.9*  < >  --   < >  --  10.7*  9.1*  --  9.4* 10.1*  --   WBC 17.8*  --   --   < >  --   --   --   < > 23.5*  --   --   < >  --   < >  --  22.8*  < >  --   < >  --  28.3* 22.8*  --  24.5* 26.2*  --   PLT 180  --   --   < >  --   --   --   < > 113*  --   --   < >  --   < >  --  58*  < >  --   < >  --  62* 53*  --  57* 71*  --   NA 137  --   --   < >  --   --   --   < > 139  --   --   < >  --   < >  --  136  < > 137  < >  --  137 138 137 140 141  --   K 6.2*  --   --   < >  --   --   --   < > 4.0  --   --   < >  --   < >  --  4.4  < > 4.0  < >  --  4.3 4.2 4.0 4.1 4.4  --   CL 97  --   --   < >  --   --   --   < > 100  --   --   < >  --   < >  --  102  < > 101  < >  --  97 99 99 101 99  --   CO2 21  --   --   < >  --   --   --   < > 28  --   --   < >  --   < >  --  25  < > 23  < >  --  25 23 24 23 23   --   GLUCOSE 163*  --   --   < >  --   --   --   < > 156*  --   --   < >  --   < >  --  95  < > 154*  < >  --  99 144* 129* 115* 96  --   BUN 19  --   --   < >  --   --   --   < > 71*  --   --   < >  --   < >  --  59*  < > 62*  < >  --  60* 67* 65* 64* 65*  --   CREATININE 1.76*  --   --   < >  --   --   --   < > 5.07*  --   --   < >  --   < >  --  3.53*  < > 3.34*  < >  --  3.29* 3.62* 3.53* 3.32* 3.40*  --   CALCIUM 8.4  --   --   < >  --   --   --   < > 7.2*  --   --   < >  --   < >  --  8.1*  < > 7.9*  < >  --  8.6 8.3* 8.5 8.4 8.7  --   MG  --   --   --   < >  --   --   --   --  2.0  --   --   --   --   < >  --  2.9*  --   --   --   --  3.1*  --   --   --  3.1*  --   PHOS  --   --   --   < >  --   --   --   --  2.0*  --   --   --   --   < >  --  3.6  --  3.6  --   --   --   --  3.4  --  4.0  --   AST 703*  --   --   < >  --   --   --   --  7829*  --   --   --   --   --   --  244*  --   --   --   --  117*  --   --   --  62*  --   ALT 791*  --   --   < >  --   --   --   --  3909*  --   --   --   --   --   --  1183*  --   --   --   --  803*  --   --   --  477*  --   ALKPHOS 121*  --   --   < >  --   --   --   --  105  --   --   --    --   --   --  215*  --   --   --   --  237*  --   --   --  242*  --   BILITOT 0.6  --   --   < >  --   --   --   --  2.2*  --   --   --   --   --   --  1.6*  --   --   --   --  1.0  --   --   --  0.8  --   PROT 7.1  --   --   < >  --   --   --   --  4.5*  --   --   --   --   --   --  5.9*  --   --   --   --  6.7  --   --   --  6.5  --   ALBUMIN 3.2*  --   --   < >  --   --   --   --  2.1*  --   --   --   --   < >  --  2.2*  --  1.9*  --   --  2.3*  --  2.1*  --  2.2*  --   INR  --   --   --   --  2.76*  --   --   --   --   --   --   --   --   --   --   --   --   --   --   --   --   --   --   --   --   --   LATICACIDVEN 9.4*  --   --   --   --   --   --   --   --   --   --   --   --   --   --   --   --   --   --  0.9  --   --   --   --   --   --   TROPONINI  --   --   --   --   --   --   --   --   --   --  0.88*  --  0.66*  --   --   --   --   --   --   --   --   --   --   --   --   --   PROBNP  --   --   --   --   --   --  3296.0*  --   --   --   --   --   --   --   --   --   --   --   --   --   --   --   --   --   --   --   PHART  --   < > 7.351  --   --  7.448  --   < >  --   < >  --   --   --   < > 7.350  --   --   --   --   --   --   --   --   --  PENDING 7.353  PCO2ART  --   < > 35.7  --   --  32.9*  --   < >  --   < >  --   --   --   < > 47.9*  --   --   --   --   --   --   --   --   --  PENDING 41.6  PO2ART  --   < > 85.0  --   --  99.0  --   < >  --   < >  --   --   --   < > 80.8  --   --   --   --   --   --   --   --   --  PENDING 67.0*  < > = values in this interval not displayed.  Recent Labs Lab 01/04/13 1146 01/04/13 1633 01/04/13 2001 01/04/13 2311 01/05/13 0308  GLUCAP 132* 90 118* 104* 127*   CXR: 7/31 - improving pulmonary aeration with positional shift in pleural effusions, overall low volume chest  ASSESSMENT / PLAN:  PULMONARY A: Acute Respiratory Failure - in setting of acute oxycodone overdose.  ARDS-resolving ? Aspiration PNA Edema P:   -wean attempts cpap 5 ps  5, goal 2 hrs, upright position important, goal rate less 35 , tv greater 5 cc/kg, rsbi less 105 -continue neg fluid balance, this is the most importnt piece to extubation in future -pcxr in am as we continue to pull volume  CARDIOVASCULAR A:  Cardiogenic shock - resolved - echo EF 35%, Periapical hypokinesis. Post VT arrest EKG 7/29 - Normal sinus rhythm, nonspecific T wave abnormality  P:  - per cardiology - echo in 8 weeks  RENAL A:   Acute Kidney Injury - secondary to overdose, ? Prolonged down time Hyperkalemia - resolved Anion Gap Acidosis - lactic, resolved  Hypermagnesemia  P:   -CVVH per Renal, did well with neg 3.3 liters, continue this and thank you -monitor BMP in am   GASTROINTESTINAL A:   Morbid Obesity Shock liver resolving, r/o admission tylenol toxicity (no level noted) Constipation - resolved, big BM noted 7/29 Hypoalbuminemia  Elevated LFTs - improving from admission, r/o shock liver vs tylenal P:   - Protonix - continue TF - colace regimen (on high dose fent) with dulcolax-off as stools increased - continue mucomyst until resolution LFT-consider today  HEMATOLOGIC A:   Macrocytic Anemia - ??ETOH abuse, resolved  Thrombocytopenia - possibly due to hematoma in R groin region, improving  Leukocytosis - no fevers  Unlikely HITT (timing does not support, doppler neg legs, other causes unlikely), dilutional / sepsis / MODS / liver dz from etoh? R Superificial Thrombosis (not deep) Wbc hemoconcetration 7/31 P:  - Folate/MVI/thiamine - scd left leg - continue elevation and warm pack to sup thrombosis -if HITT neg would add sub q heparin, not lovenox with arf  INFECTIOUS A:   Rule out Aspiration PNA - not impressed, Bronch unimpresive P:  - maintain ceftriaxone, add stop date 7 days = today - follow fever curve   ENDOCRINE A:   Concern for Metabolic Syndrome, HbA1c 5.6  P:   - SSI  NEUROLOGIC A:   Acute Encephalopathy - in setting of  overdose, known oxy IR +/- other substances, head Ct neg ? ETOH Abuse R/o seizure focus Component hepatic enceph? P:   - continue propofol -continue to hold lactulose as neurostatus is improved and loose stools - WUA  Today's summary: goal to neg again today, continue neg balance, continue weaning trials  I have personally obtained a history, examined the patient, evaluated laboratory and imaging results, formulated the assessment and plan and placed orders.  CRITICAL CARE: The patient is critically ill with multiple organ systems failure and requires high complexity decision making for assessment and support, frequent evaluation and titration of therapies, application of advanced monitoring technologies and extensive interpretation of multiple databases. Critical Care Time devoted to patient care services described in this note is 35 minutes.   Leo Rod, PA-S  Mcarthur Rossetti. Tyson Alias, MD, FACP Pgr: [phone] [employer] [facility]

## 2013-01-05 NOTE — Progress Notes (Signed)
MEDICATION RELATED CONSULT NOTE - FOLLOW UP   Pharmacy Consult for Mucomyst Indication: ? Tylenol overdose  No Known Allergies  Patient Measurements: Height: 6' (182.9 cm) Weight: 280 lb 3.3 oz (127.1 kg) IBW/kg (Calculated) : 77.6  Labs:  Recent Labs  01/03/13 0530  01/03/13 1610  01/04/13 0405 01/04/13 1200 01/04/13 1600 01/04/13 2330 01/05/13 0500  WBC 22.8*  < >  --   < > 28.3* 22.8*  --  24.5* 26.2*  HGB 9.9*  < >  --   < > 10.7* 9.1*  --  9.4* 10.1*  HCT 31.4*  < >  --   < > 32.1* 26.9*  --  28.5* 30.1*  PLT 58*  < >  --   < > 62* 53*  --  57* 71*  CREATININE 3.53*  < > 3.34*  < > 3.29* 3.62* 3.53* 3.32* 3.40*  MG 2.9*  --   --   --  3.1*  --   --   --  3.1*  PHOS 3.6  --  3.6  --   --   --  3.4  --  4.0  ALBUMIN 2.2*  --  1.9*  --  2.3*  --  2.1*  --  2.2*  PROT 5.9*  --   --   --  6.7  --   --   --  6.5  AST 244*  --   --   --  117*  --   --   --  62*  ALT 1183*  --   --   --  803*  --   --   --  477*  ALKPHOS 215*  --   --   --  237*  --   --   --  242*  BILITOT 1.6*  --   --   --  1.0  --   --   --  0.8  BILIDIR  --   --   --   --  0.6*  --   --   --   --   IBILI  --   --   --   --  0.4  --   --   --   --   < > = values in this interval not displayed. Estimated Creatinine Clearance: 34.6 ml/min (by C-G formula based on Cr of 3.4).  Assessment: 52 year old admitted 7/24 with ? Oxycodone over dose.  Tylenol level on admission < 15.  Liver enzymes peaked 7/26 and have trended downward since then.  Mucomyst per tube started 7/29 for a planned 17 doses for ? Tylenol overdose.  Plan:  1) Continue Mucomyst 9560 mg (70 mg/kg) per tube every 4 hours hours 2) Last dose August 1 at 6 am  Thank you. Okey Regal, PharmD [phone]  01/05/2013,9:19 AM

## 2013-01-05 NOTE — Progress Notes (Signed)
UR Completed.  Nicholas Potts [phone] 01/05/2013  

## 2013-01-06 ENCOUNTER — Inpatient Hospital Stay (HOSPITAL_COMMUNITY): Payer: BC Managed Care – PPO

## 2013-01-06 LAB — POCT I-STAT 3, ART BLOOD GAS (G3+)
Acid-base deficit: 2 mmol/L (ref 0.0–2.0)
Bicarbonate: 26 mEq/L — ABNORMAL HIGH (ref 20.0–24.0)
O2 Saturation: 92 %
Patient temperature: 98.6
pO2, Arterial: 77 mmHg — ABNORMAL LOW (ref 80.0–100.0)

## 2013-01-06 LAB — BASIC METABOLIC PANEL
BUN: 67 mg/dL — ABNORMAL HIGH (ref 6–23)
Calcium: 8.5 mg/dL (ref 8.4–10.5)
GFR calc non Af Amer: 21 mL/min — ABNORMAL LOW (ref >90)
Glucose, Bld: 119 mg/dL — ABNORMAL HIGH (ref 70–99)

## 2013-01-06 LAB — RENAL FUNCTION PANEL
BUN: 70 mg/dL — ABNORMAL HIGH (ref 6–23)
CO2: 23 mEq/L (ref 19–32)
Chloride: 99 mEq/L (ref 96–112)
Creatinine, Ser: 3.33 mg/dL — ABNORMAL HIGH (ref 0.50–1.35)
Glucose, Bld: 138 mg/dL — ABNORMAL HIGH (ref 70–99)
Potassium: 4.3 mEq/L (ref 3.5–5.1)

## 2013-01-06 LAB — GLUCOSE, CAPILLARY
Glucose-Capillary: 102 mg/dL — ABNORMAL HIGH (ref 70–99)
Glucose-Capillary: 99 mg/dL (ref 70–99)
Glucose-Capillary: 132 mg/dL — ABNORMAL HIGH (ref 70–99)
Glucose-Capillary: 119 mg/dL — ABNORMAL HIGH (ref 70–99)

## 2013-01-06 LAB — CBC
HCT: 30.1 % — ABNORMAL LOW (ref 39.0–52.0)
Hemoglobin: 10 g/dL — ABNORMAL LOW (ref 13.0–17.0)
MCH: 32.5 pg (ref 26.0–34.0)
MCHC: 34 g/dL (ref 30.0–36.0)
MCV: 95.9 fL (ref 78.0–100.0)
Platelets: 106 10*3/uL — ABNORMAL LOW (ref 150–400)
RDW: 15.7 % — ABNORMAL HIGH (ref 11.5–15.5)
WBC: 26.3 10*3/uL — ABNORMAL HIGH (ref 4.0–10.5)

## 2013-01-06 LAB — COMPREHENSIVE METABOLIC PANEL
Alkaline Phosphatase: 249 U/L — ABNORMAL HIGH (ref 39–117)
BUN: 66 mg/dL — ABNORMAL HIGH (ref 6–23)
CO2: 23 mEq/L (ref 19–32)
Chloride: 99 mEq/L (ref 96–112)
GFR calc Af Amer: 23 mL/min — ABNORMAL LOW (ref >90)
GFR calc non Af Amer: 20 mL/min — ABNORMAL LOW (ref >90)
Glucose, Bld: 106 mg/dL — ABNORMAL HIGH (ref 70–99)
Potassium: 4.3 mEq/L (ref 3.5–5.1)
Total Bilirubin: 0.7 mg/dL (ref 0.3–1.2)
Total Protein: 7.1 g/dL (ref 6.0–8.3)

## 2013-01-06 MED ORDER — HEPARIN SODIUM (PORCINE) 5000 UNIT/ML IJ SOLN
5000.0000 [IU] | Freq: Three times a day (TID) | INTRAMUSCULAR | Status: DC
  Administered 2013-01-06 – 2013-01-27 (×61): 5000 [IU] via SUBCUTANEOUS
  Filled 2013-01-06 (×67): qty 1

## 2013-01-06 MED ORDER — VANCOMYCIN HCL 10 G IV SOLR
2000.0000 mg | Freq: Once | INTRAVENOUS | Status: AC
  Administered 2013-01-06: 2000 mg via INTRAVENOUS
  Filled 2013-01-06: qty 2000

## 2013-01-06 MED ORDER — VANCOMYCIN HCL 10 G IV SOLR
1250.0000 mg | INTRAVENOUS | Status: DC
  Administered 2013-01-07: 1250 mg via INTRAVENOUS
  Filled 2013-01-06: qty 1250

## 2013-01-06 MED ORDER — PROPOFOL 10 MG/ML IV EMUL
5.0000 ug/kg/min | INTRAVENOUS | Status: DC
  Administered 2013-01-06 (×2): 28 ug/kg/min via INTRAVENOUS
  Administered 2013-01-07: 20 ug/kg/min via INTRAVENOUS
  Administered 2013-01-07 – 2013-01-08 (×3): 15 ug/kg/min via INTRAVENOUS
  Filled 2013-01-06 (×6): qty 100

## 2013-01-06 NOTE — Progress Notes (Signed)
Subjective:  Patient remains intubated, responds to commands  Objective:  Vital Signs in the last 24 hours: Temp:  [97.6 F (36.4 C)-99.8 F (37.7 C)] 99.8 F (37.7 C) (08/01 0716) Pulse Rate:  [77-95] 77 (07/31 1540) Resp:  [19-30] 21 (08/01 0700) BP: (88-136)/(48-68) 109/66 mmHg (08/01 0700) SpO2:  [95 %-100 %] 99 % (08/01 0700) FiO2 (%):  [40 %] 40 % (08/01 0700) Weight:  [269 lb 10 oz (122.3 kg)] 269 lb 10 oz (122.3 kg) (08/01 0448)  Intake/Output from previous day: 07/31 0701 - 08/01 0700 In: 1999.9 [I.V.:844.9; NG/GT:1155] Out: 5889 [Urine:18; Stool:50] Intake/Output from this shift:    . antiseptic oral rinse  15 mL Mouth Rinse QID  . carvedilol  3.125 mg Per NG tube BID WC  . chlorhexidine  15 mL Mouth Rinse BID  . darbepoetin (ARANESP) injection - NON-DIALYSIS  150 mcg Subcutaneous Q Mon-1800  . feeding supplement  60 mL Oral 5 X Daily  . hydrALAZINE  25 mg Oral Q8H  . insulin aspart  1 Units Subcutaneous Q4H  . insulin aspart  1-3 Units Subcutaneous Q4H  . insulin glargine  10 Units Subcutaneous Q24H  . isosorbide dinitrate  10 mg Per Tube TID  . pantoprazole sodium  40 mg Per Tube Q1200  . banana bag IV 1000 mL   Intravenous Once  . sodium chloride  10-40 mL Intracatheter Q12H  . sodium polystyrene  30 g Per Tube Once   . sodium chloride Stopped (01/01/13 1400)  . sodium chloride Stopped (12/31/12 2300)  . dextrose    . feeding supplement (PROMOTE) 1,000 mL (01/05/13 1329)  . fentaNYL infusion INTRAVENOUS 75 mcg/hr (01/06/13 0500)  . dialysis replacement fluid (prismasate) 500 mL/hr at 01/06/13 0531  . dialysis replacement fluid (prismasate) 300 mL/hr at 01/05/13 1513  . dialysate (PRISMASATE) 1,500 mL/hr at 01/06/13 0615  . propofol 20 mcg/kg/min (01/06/13 0500)    Physical Exam: The patient is intubated. Diffuse anasarca but continues to improve  Neck with dialysis and central venous catheter  Chest CTA anteriorly  Heart sounds are very  distant. No murmur gallop or rub.  Abd mildly distended, no masses Scrotal edema improving  Extremities reveal 1 + edema  Neurologic exam not testable.   Lab Results:  Recent Labs  01/06/13 01/06/13 0500  WBC 25.6* 26.3*  HGB 9.8* 10.0*  PLT 106* 109*    Recent Labs  01/06/13 01/06/13 0500  NA 134* 138  K 4.1 4.3  CL 96 99  CO2 23 23  GLUCOSE 119* 106*  BUN 67* 66*  CREATININE 3.19* 3.28*     Recent Labs  01/04/13 0405  01/06/13 0500  PROT 6.7  < > 7.1  ALBUMIN 2.3*  < > 2.5*  AST 117*  < > 58*  ALT 803*  < > 321*  ALKPHOS 237*  < > 249*  BILITOT 1.0  < > 0.7  BILIDIR 0.6*  --   --   IBILI 0.4  --   --   < > = values in this interval not displayed.       Assessment/Plan:  1. Cardiogenic shock- improved; nonobstructive CAD by cath. Severe LV dsyfunction. Continue hydralazine/nitrates; no ACEI for now given acute renal insuff. Continue coreg 3.125 mg BID. Increase meds as tolerated by pulse and BP; repeat echo in 8 weeks to see if LV function has improved. 2. S/P respiratory and cardiac arrest-felt to most likely be respiratory arrest related to sedatives and OSA followed  by aspiration. We were not able to completely rule out pulmonary embolus. Lower ext dopplers neg. Heparin DCed given acute blood loss anemia and inability to transfuse (Jehovah's witness) as well as thrombocytopenia.  3. Acute renal failure-most likely ATN from cardiac arrest/cardiogenic shock; also probable contribution from contrast; patient remains volume overloaded but improving with CVVHD; little urine output; nephrology managing. 4. VDRF-management per CCM 5. Acute blood loss anemia-patient is Jehovah's witness. 6. Thrombocytopenia-Plts improving; HIT pending. If neg, would resume DVT prophylaxis heparin 7. Pneumonia-continue antibiotics.            LOS: 8 days    Olga Millers 01/06/2013, 7:54 AM

## 2013-01-06 NOTE — Progress Notes (Signed)
ANTIBIOTIC CONSULT NOTE - INITIAL  Pharmacy Consult for Vancomycin Indication: pneumonia  No Known Allergies  Patient Measurements: Height: 6' (182.9 cm) Weight: 269 lb 10 oz (122.3 kg) IBW/kg (Calculated) : 77.6 Adjusted Body Weight: n/a  Vital Signs: Temp: 98.4 F (36.9 C) (08/01 1602) Temp src: Oral (08/01 1602) BP: 113/65 mmHg (08/01 1617) Pulse Rate: 80 (08/01 1617) Intake/Output from previous day: 07/31 0701 - 08/01 0700 In: 1999.9 [I.V.:844.9; NG/GT:1155] Out: 5889 [Urine:18; Stool:50] Intake/Output from this shift: Total I/O In: 563.5 [I.V.:243.5; NG/GT:320] Out: 2040 [Other:1915; Stool:125]  Labs:  Recent Labs  01/05/13 0500  01/06/13 01/06/13 0500 01/06/13 1600  WBC 26.2*  --  25.6* 26.3*  --   HGB 10.1*  --  9.8* 10.0*  --   PLT 71*  --  106* 109*  --   CREATININE 3.40*  < > 3.19* 3.28* 3.33*  < > = values in this interval not displayed. Estimated Creatinine Clearance: 34.7 ml/min (by C-G formula based on Cr of 3.33). No results found for this basename: VANCOTROUGH, VANCOPEAK, VANCORANDOM, GENTTROUGH, GENTPEAK, GENTRANDOM, TOBRATROUGH, TOBRAPEAK, TOBRARND, AMIKACINPEAK, AMIKACINTROU, AMIKACIN,  in the last 72 hours   Microbiology: Recent Results (from the past 720 hour(s))  URINE CULTURE     Status: None   Collection Time    12/29/12  3:08 PM      Result Value Range Status   Specimen Description URINE, CATHETERIZED   Final   Special Requests NONE   Final   Culture  Setup Time 12/29/2012 23:00   Final   Colony Count NO GROWTH   Final   Culture NO GROWTH   Final   Report Status 12/30/2012 FINAL   Final  CULTURE, BLOOD (ROUTINE X 2)     Status: None   Collection Time    12/29/12  3:27 PM      Result Value Range Status   Specimen Description BLOOD HAND LEFT   Final   Special Requests BOTTLES DRAWN AEROBIC ONLY 5CC   Final   Culture  Setup Time 12/29/2012 22:48   Final   Culture NO GROWTH 5 DAYS   Final   Report Status 01/04/2013 FINAL   Final   CULTURE, BLOOD (ROUTINE X 2)     Status: None   Collection Time    12/29/12  3:37 PM      Result Value Range Status   Specimen Description BLOOD ARM LEFT   Final   Special Requests BOTTLES DRAWN AEROBIC ONLY 2CC   Final   Culture  Setup Time 12/29/2012 22:48   Final   Culture NO GROWTH 5 DAYS   Final   Report Status 01/04/2013 FINAL   Final  URINE CULTURE     Status: None   Collection Time    12/29/12  7:11 PM      Result Value Range Status   Specimen Description URINE, CATHETERIZED   Final   Special Requests NONE   Final   Culture  Setup Time 12/29/2012 21:09   Final   Colony Count NO GROWTH   Final   Culture NO GROWTH   Final   Report Status 12/31/2012 FINAL   Final  MRSA PCR SCREENING     Status: None   Collection Time    12/30/12  2:24 AM      Result Value Range Status   MRSA by PCR NEGATIVE  NEGATIVE Final   Comment:            The GeneXpert MRSA Assay (FDA  approved for NASAL specimens     only), is one component of a     comprehensive MRSA colonization     surveillance program. It is not     intended to diagnose MRSA     infection nor to guide or     monitor treatment for     MRSA infections.  CULTURE, RESPIRATORY (NON-EXPECTORATED)     Status: None   Collection Time    12/30/12 11:51 AM      Result Value Range Status   Specimen Description TRACHEAL ASPIRATE   Final   Special Requests NONE   Final   Gram Stain     Final   Value: RARE WBC PRESENT, PREDOMINANTLY PMN     NO SQUAMOUS EPITHELIAL CELLS SEEN     NO ORGANISMS SEEN   Culture Non-Pathogenic Oropharyngeal-type Flora Isolated.   Final   Report Status 01/02/2013 FINAL   Final  CLOSTRIDIUM DIFFICILE BY PCR     Status: None   Collection Time    01/03/13  4:35 PM      Result Value Range Status   C difficile by pcr NEGATIVE  NEGATIVE Final  CULTURE, BAL-QUANTITATIVE     Status: None   Collection Time    01/04/13  3:35 PM      Result Value Range Status   Specimen Description BRONCHIAL ALVEOLAR LAVAGE    Final   Special Requests BRONCH WASH LL LOBE    Final   Gram Stain     Final   Value: RARE WBC PRESENT, PREDOMINANTLY PMN     NO SQUAMOUS EPITHELIAL CELLS SEEN     NO ORGANISMS SEEN   Colony Count 30,000 COLONIES/ML   Final   Culture     Final   Value: STAPHYLOCOCCUS AUREUS     Note: RIFAMPIN AND GENTAMICIN SHOULD NOT BE USED AS SINGLE DRUGS FOR TREATMENT OF STAPH INFECTIONS.   Report Status PENDING   Incomplete    Medical History: Past Medical History  Diagnosis Date  . Hyperlipidemia   . OSA (obstructive sleep apnea)   . CVA (cerebral infarction)     Medications:  Scheduled:  . antiseptic oral rinse  15 mL Mouth Rinse QID  . carvedilol  3.125 mg Per NG tube BID WC  . chlorhexidine  15 mL Mouth Rinse BID  . darbepoetin (ARANESP) injection - NON-DIALYSIS  150 mcg Subcutaneous Q Mon-1800  . feeding supplement  60 mL Oral 5 X Daily  . heparin subcutaneous  5,000 Units Subcutaneous Q8H  . hydrALAZINE  25 mg Oral Q8H  . insulin aspart  1 Units Subcutaneous Q4H  . insulin aspart  1-3 Units Subcutaneous Q4H  . insulin glargine  10 Units Subcutaneous Q24H  . isosorbide dinitrate  10 mg Per Tube TID  . pantoprazole sodium  40 mg Per Tube Q1200  . sodium chloride  10-40 mL Intracatheter Q12H  . [START ON 01/07/2013] vancomycin  1,250 mg Intravenous Q24H  . vancomycin  2,000 mg Intravenous Once   Assessment: 52 yo male admitted with oxycodone overdose, course complicated by cardiac arrest.  Now growing staph aureus in BAL.  He was previously on vancomycin this admission, last dose given 7/28.  He remains on CVVH.  Goal of Therapy:  Vancomycin trough level 15-20 mcg/ml  Plan:  1. Vancomycin 2g IV x 1 now. 2. Then start Vancomycin 10 mg/kg q 24 hrs while on CVVH (1250 mg q 24 hrs). 3. Continue to follow cultures and renal function, vancomycin  trough level as appropriate.  Tad Moore, BCPS  Clinical Pharmacist Pager [phone]  01/06/2013 4:54 PM

## 2013-01-06 NOTE — Progress Notes (Signed)
CRRT Management Note  Continues to pull -150/hour without difficulty and stable BP Using all 4K fluids No heparin (Jehovah Witness) Weight continues to drop and despite nearing first hosp weight, still with anasarca (much better though)  WEIGHTS:  01/06/13 0448 122.3 kg  01/05/13 0500 127.1 kg  01/04/13 0400 130.9 kg  01/03/13 0500 136.6 kg (incr neg 150/hr w/CRRT)  01/02/13 0500 138.5 kg (incr neg 100/hr w/CRRT)  01/01/13 0441 141 kg  12/31/12 0500 141.4 kg  12/30/12 0500 135.8 kg (CRRT started neg 50/hour)  12/29/12 2300 133.3 kg  12/29/12 1708 121.564 kg   Potassium and phosphorus still looking OK. Still not making urine (18 cc)  Continue current prescription.

## 2013-01-06 NOTE — Progress Notes (Signed)
eLink Physician-Brief Progress Note Patient Name: Nicholas Potts DOB: 1961-03-02 MRN: [id number]  Date of Service  01/06/2013   HPI/Events of Note     eICU Interventions  Resume vanc until ID of staph in BAL clear   Intervention Category Intermediate Interventions: Diagnostic test evaluation  Tarrah Furuta V. 01/06/2013, 4:26 PM

## 2013-01-06 NOTE — Progress Notes (Signed)
NUTRITION FOLLOW UP  Intervention:   1.  Enteral nutrition; increase Promote to 20 mL/hr and continue Prostat 60 mL (2 packets) 5 times daily to provide 1480 kcal, 180g protein, 402 mL free water.   Nutrition provided by TF + propofol:  2012 kcal, 180g protein, 402 mL free water.     Nutrition Dx:   Inadequate oral intake, ongoing  Monitor:   1. Enteral nutrition; initiation with tolerance. Enteral nutrition to provide 60-70% of estimated calorie needs (22-25 kcals/kg ideal body weight) and 100% of estimated protein needs, based on ASPEN guidelines for permissive underfeeding in critically ill obese individuals. Met. 2. Wt/wt change; monitor trends.  Ongoing.  Assessment:   Pt with recent Achilles tendon surgery admitted with AMS; etiology unclear.  Developed PEA arrest requiring CPR and intubation. Pt now with cardiogenic shock, acute kidney injury, and shock liver.  Requiring CVVHD. Patient is currently intubated on ventilator support.  MV: 13.3 L/min Temp:Temp (24hrs), Avg:98.7 F (37.1 C), Min:97.6 F (36.4 C), Max:99.8 F (37.7 C)  Propofol: 20.4 ml/hr currently providing 538 kcal/d Stool output improved. Will make adjustments based on propofol rate.  Discussed with RN.  TFs currently on hold due to possible extubation.  RN to resume if extubation plans change.   Note change in BMI with fluid loss.   Height: Ht Readings from Last 1 Encounters:  12/29/12 6' (1.829 m)    Weight Status:   Wt Readings from Last 1 Encounters:  01/06/13 269 lb 10 oz (122.3 kg)   Re-estimated needs:  Kcal: 2612 Permissive underfeeding goals:  1780-2020 Protein: >/=177g Fluid: ~2.4 L/day or per MD discretion  Skin: generalized edema  Diet Order:  NPO   Intake/Output Summary (Last 24 hours) at 01/06/13 0910 Last data filed at 01/06/13 0800  Gross per 24 hour  Intake 1908.7 ml  Output   5814 ml  Net -3905.3 ml    Last BM: 8/1, rectal tube   Labs:   Recent Labs Lab  01/04/13 0405  01/05/13 0500 01/05/13 1600 01/06/13 01/06/13 0500  NA 137  < > 141 139 134* 138  K 4.3  < > 4.4 3.9 4.1 4.3  CL 97  < > 99 99 96 99  CO2 25  < > 23 22 23 23   BUN 60*  < > 65* 66* 67* 66*  CREATININE 3.29*  < > 3.40* 3.21* 3.19* 3.28*  CALCIUM 8.6  < > 8.7 8.3* 8.5 8.9  MG 3.1*  --  3.1*  --   --  3.1*  PHOS  --   < > 4.0 4.8*  --  5.3*  GLUCOSE 99  < > 96 117* 119* 106*  < > = values in this interval not displayed.  CBG (last 3)   Recent Labs  01/06/13 0007 01/06/13 0430 01/06/13 0718  GLUCAP 102* 99 128*    Scheduled Meds: . antiseptic oral rinse  15 mL Mouth Rinse QID  . carvedilol  3.125 mg Per NG tube BID WC  . chlorhexidine  15 mL Mouth Rinse BID  . darbepoetin (ARANESP) injection - NON-DIALYSIS  150 mcg Subcutaneous Q Mon-1800  . feeding supplement  60 mL Oral 5 X Daily  . heparin subcutaneous  5,000 Units Subcutaneous Q8H  . hydrALAZINE  25 mg Oral Q8H  . insulin aspart  1 Units Subcutaneous Q4H  . insulin aspart  1-3 Units Subcutaneous Q4H  . insulin glargine  10 Units Subcutaneous Q24H  . isosorbide dinitrate  10 mg  Per Tube TID  . pantoprazole sodium  40 mg Per Tube Q1200  . banana bag IV 1000 mL   Intravenous Once  . sodium chloride  10-40 mL Intracatheter Q12H  . sodium polystyrene  30 g Per Tube Once    Continuous Infusions: . sodium chloride Stopped (01/01/13 1400)  . sodium chloride Stopped (12/31/12 2300)  . dextrose    . feeding supplement (PROMOTE) 1,000 mL (01/05/13 1329)  . fentaNYL infusion INTRAVENOUS 75 mcg/hr (01/06/13 0500)  . dialysis replacement fluid (prismasate) 500 mL/hr at 01/06/13 0531  . dialysis replacement fluid (prismasate) 300 mL/hr at 01/05/13 1513  . dialysate (PRISMASATE) 1,500 mL/hr at 01/06/13 0615  . propofol 20 mcg/kg/min (01/06/13 0500)    Loyce Dys, MS RD LDN Clinical Inpatient Dietitian Pager: [phone] Weekend/After hours pager: [phone]

## 2013-01-06 NOTE — Progress Notes (Signed)
PULMONARY  / CRITICAL CARE MEDICINE  Name: Nicholas Potts MRN: [id number] DOB: [birth year]    ADMISSION DATE:  12/29/2012  REFERRING MD :  EDP- Dr. Dan Humphreys PRIMARY SERVICE: PCCM  CHIEF COMPLAINT:  Acute Overdose  BRIEF PATIENT DESCRIPTION: [name] who presented to [facility] ER on 7/24 with AMS related to oxycodone overdose.  Underwent R achilles tendon reconstruction 7/20 with RLE cast in place on admit.  COurse complicated by respiratory arrest initially, then full cardiac arrest. He had episodes of ventricular tachycardia and was defibrillated . He had CPR with return of spontaneous circulation. Emergent cath >> EF 20%, mild non obstructive CAD. Required placement of Impella device for cardiogenic shock  SIGNIFICANT EVENTS / STUDIES:  7/20 - R ankle surgery (? Type, no [facility] Records) ........................................................................................................ 7/24 - Admit to [facility] with ER with acute oxy IR OD 7/25 duplex neg DVT 7/26- impella out 7/28-Dobutamine 7/28 eeg- This EEG is abnormal with moderately severe generalized nonspecific slowing of cerebral activity, which can be seen with a wide variety of encephalopathic processes. No epileptic activity was recorded.  7/29 - vent settings improved to peep 5, neg 2.5 liters cvvhd 7/30 upper ext doppler US - no obvious evidence of DVT of R or L upper ext, appears to be superficial thrombosis in R basilic and antecubital communicating veins 7/30 bronch - no significant secretions, no major distal plugging, BAL RLL, clear, white 8/1- weaning well, excellent neurostatus, neg 3.7 liters  LINES / TUBES: L [place] TLC 7/24>>> OETT 7/24>>> Aline 7/24>>>729 Femoral swan ganz 7/24>>>7/27 Impella device 7/24>>>7/26  CULTURES: Sputum 7/25>>>non-pathogenic oropharyngeal-type flora insolated (rare WBC present, mostly PMN) BCx2 7/24>>>ng UC 7/24>>>ng C. Diff 7/29>>>neg Bronch bal 7/30>>>rare WBC, mostly  PMN>>>  ANTIBIOTICS: Vanco 7/24>>>>7/28 Zosyn 7/2/4>>>7/28 Ceftriaxone 7/28>>>7/31  SUBJECTIVE:  Patient is awake and alert this AM. Responding to commands. Tried to write bathroom yesterday. Currently weaning.   VITAL SIGNS: Temp:  [97.6 F (36.4 C)-99.8 F (37.7 C)] 99.8 F (37.7 C) (08/01 0716) Pulse Rate:  [77-95] 77 (07/31 1540) Resp:  [19-30] 21 (08/01 0700) BP: (88-136)/(48-68) 109/66 mmHg (08/01 0700) SpO2:  [95 %-100 %] 99 % (08/01 0700) FiO2 (%):  [40 %] 40 % (08/01 0700) Weight:  [269 lb 10 oz (122.3 kg)] 269 lb 10 oz (122.3 kg) (08/01 0448)  HEMODYNAMICS: CVP:  [4 mmHg-11 mmHg] 4 mmHg  VENTILATOR SETTINGS: Vent Mode:  [-] PRVC FiO2 (%):  [40 %] 40 % Set Rate:  [24 bmp] 24 bmp Vt Set:  [550 mL] 550 mL PEEP:  [5 cmH20] 5 cmH20 Pressure Support:  [5 cmH20] 5 cmH20 Plateau Pressure:  [18 cmH20-22 cmH20] 22 cmH20  INTAKE / OUTPUT: Intake/Output     07/31 0701 - 08/01 0700 08/01 0701 - 08/02 0700   I.V. (mL/kg) 844.9 (6.9)    NG/GT 1155    IV Piggyback     Total Intake(mL/kg) 1999.9 (16.4)    Urine (mL/kg/hr) 18 (0)    Other 5821 (2)    Stool 50 (0)    Total Output 5889     Net -3889.1            PHYSICAL EXAMINATION: General: acutely ill  on vent, in NAD Neuro:  Awake, some response to commands, RASS 0, tremors occur intermittently and with stimulus per nurse HEENT: short thick neck Cardiovascular:  s1s2 distant, regular Lungs:  Coarse bil improved Abdomen:  Distended mild, Round/soft, bsx4 active Musculoskeletal:  No acute deformities, RLE with partial hard cast Skin:  Cool, dry, no edema, L groin region has large bruising at site of impella, scrotal enlargement about same  LABS:  Recent Labs Lab 12/31/12 0500  12/31/12 1032  12/31/12 1430  01/03/13 0325  01/04/13 0400 01/04/13 0405  01/05/13 0500 01/05/13 0508 01/05/13 1600 01/06/13 01/06/13 0500  HGB 12.4*  --   --   < >  --   < >  --   < >  --  10.7*  < > 10.1*  --   --  9.8* 10.0*   WBC 23.5*  --   --   < >  --   < >  --   < >  --  28.3*  < > 26.2*  --   --  25.6* 26.3*  PLT 113*  --   --   < >  --   < >  --   < >  --  62*  < > 71*  --   --  106* 109*  NA 139  --   --   < >  --   < >  --   < >  --  137  < > 141  --  139 134* 138  K 4.0  --   --   < >  --   < >  --   < >  --  4.3  < > 4.4  --  3.9 4.1 4.3  CL 100  --   --   < >  --   < >  --   < >  --  97  < > 99  --  99 96 99  CO2 28  --   --   < >  --   < >  --   < >  --  25  < > 23  --  22 23 23   GLUCOSE 156*  --   --   < >  --   < >  --   < >  --  99  < > 96  --  117* 119* 106*  BUN 71*  --   --   < >  --   < >  --   < >  --  60*  < > 65*  --  66* 67* 66*  CREATININE 5.07*  --   --   < >  --   < >  --   < >  --  3.29*  < > 3.40*  --  3.21* 3.19* 3.28*  CALCIUM 7.2*  --   --   < >  --   < >  --   < >  --  8.6  < > 8.7  --  8.3* 8.5 8.9  MG 2.0  --   --   --   --   < >  --   < >  --  3.1*  --  3.1*  --   --   --  3.1*  PHOS 2.0*  --   --   --   --   < >  --   < >  --   --   < > 4.0  --  4.8*  --  5.3*  AST 6396*  --   --   --   --   --   --   < >  --  117*  --  62*  --   --   --  58*  ALT 3909*  --   --   --   --   --   --   < >  --  803*  --  477*  --   --   --  321*  ALKPHOS 105  --   --   --   --   --   --   < >  --  237*  --  242*  --   --   --  249*  BILITOT 2.2*  --   --   --   --   --   --   < >  --  1.0  --  0.8  --   --   --  0.7  PROT 4.5*  --   --   --   --   --   --   < >  --  6.7  --  6.5  --   --   --  7.1  ALBUMIN 2.1*  --   --   --   --   < >  --   < >  --  2.3*  < > 2.2*  --  2.1*  --  2.5*  LATICACIDVEN  --   --   --   --   --   --   --   --  0.9  --   --   --   --   --   --   --   TROPONINI  --   --  0.88*  --  0.66*  --   --   --   --   --   --   --   --   --   --   --   PHART  --   < >  --   --   --   < > 7.350  --   --   --   --  TEST WILL BE CREDITED 7.353  --   --   --   PCO2ART  --   < >  --   --   --   < > 47.9*  --   --   --   --  TEST WILL BE CREDITED 41.6  --   --   --   PO2ART  --   < >  --    --   --   < > 80.8  --   --   --   --  TEST WILL BE CREDITED 67.0*  --   --   --   < > = values in this interval not displayed.  Recent Labs Lab 01/05/13 1136 01/05/13 1556 01/05/13 1934 01/06/13 0007 01/06/13 0430  GLUCAP 113* 100* 106* 102* 99   CXR: 8/1 -  Low lung volumes, bil pleural effusions, L base atx or infiltrate, no major changes , small lung volumes, exhilation (may appear hilar prom from this)  ASSESSMENT / PLAN:  PULMONARY A: Acute Respiratory Failure - in setting of acute oxycodone overdose.  ARDS-resolving ? Aspiration PNA Edema MAJOR issues P:   -wean attempts cpap 5 ps 5, goal 2 hrs, upright position important, neurostatus excellent today, abg  -continue neg fluid balance, this has made huge important -will need BIPAP nocturnal if extubated -Bp controlled  CARDIOVASCULAR A:  Cardiogenic shock - resolved - echo EF 35%, Periapical hypokinesis. Post VT arrest EKG 7/29 - Normal sinus rhythm, nonspecific T wave abnormality  P:  -  per cardiology - echo in 8 weeks  RENAL A:   Acute Kidney Injury - secondary to overdose, ? Prolonged down time Hyperkalemia - resolved Anion Gap Acidosis - lactic, resolved  Hypermagnesemia  P:   -CVVH per Renal, did well with neg 3.7 liters -kvo -monitor BMP in am   GASTROINTESTINAL A:   Morbid Obesity Shock liver resolving, r/o admission tylenol toxicity (no level noted) Elevated LFTs - improving from admission, r/o shock liver vs tylenol P:   - Protonix - hold TF as weaning well - colace regimen (on high dose fent) with dulcolax-off as stools increased  HEMATOLOGIC A:   Macrocytic Anemia - ??ETOH abuse, resolved  Thrombocytopenia - possibly due to hematoma in R groin region, improving  Leukocytosis - no fevers  HITT neg  R Superificial Thrombosis (not deep) Wbc hemoconcetration 8/1 P:  - Folate/MVI/thiamine - scd left leg, add sub q heparin today , HITT neg - continue elevation and warm pack to sup  thrombosis  INFECTIOUS A:   Rule out Aspiration PNA - not impressed, Bronch unimpresive P:  - follow fever curve  -pcxr no defined infiltrate  ENDOCRINE A:   Concern for Metabolic Syndrome, HbA1c 5.6  P:   - SSI  NEUROLOGIC A:   Acute Encephalopathy - in setting of overdose, known oxy IR +/- other substances, head Ct neg ? ETOH Abuse R/o seizure focus Component hepatic enceph? P:   - continue propofol -continue to hold lactulose as neurostatus is improved and loose stools - WUA  Today's summary: goal to neg again today, continue neg balance, continue weaning trials, consider extubating today  I have personally obtained a history, examined the patient, evaluated laboratory and imaging results, formulated the assessment and plan and placed orders.  CRITICAL CARE: The patient is critically ill with multiple organ systems failure and requires high complexity decision making for assessment and support, frequent evaluation and titration of therapies, application of advanced monitoring technologies and extensive interpretation of multiple databases. Critical Care Time devoted to patient care services described in this note is 35 minutes.   Leo Rod, PA-S  Mcarthur Rossetti. Tyson Alias, MD, FACP Pgr: [phone] [facility]

## 2013-01-07 ENCOUNTER — Inpatient Hospital Stay (HOSPITAL_COMMUNITY): Payer: BC Managed Care – PPO

## 2013-01-07 LAB — RENAL FUNCTION PANEL
Albumin: 2.5 g/dL — ABNORMAL LOW (ref 3.5–5.2)
Albumin: 2.6 g/dL — ABNORMAL LOW (ref 3.5–5.2)
BUN: 74 mg/dL — ABNORMAL HIGH (ref 6–23)
CO2: 22 mEq/L (ref 19–32)
Calcium: 8.5 mg/dL (ref 8.4–10.5)
Chloride: 97 mEq/L (ref 96–112)
Chloride: 96 mEq/L (ref 96–112)
Creatinine, Ser: 3.67 mg/dL — ABNORMAL HIGH (ref 0.50–1.35)
GFR calc Af Amer: 21 mL/min — ABNORMAL LOW (ref >90)
GFR calc non Af Amer: 18 mL/min — ABNORMAL LOW (ref >90)
Potassium: 4.3 mEq/L (ref 3.5–5.1)
Sodium: 135 mEq/L (ref 135–145)

## 2013-01-07 LAB — CBC
Hemoglobin: 10 g/dL — ABNORMAL LOW (ref 13.0–17.0)
MCH: 31.2 pg (ref 26.0–34.0)
MCHC: 32.2 g/dL (ref 30.0–36.0)
RDW: 15.4 % (ref 11.5–15.5)

## 2013-01-07 LAB — GLUCOSE, CAPILLARY
Glucose-Capillary: 118 mg/dL — ABNORMAL HIGH (ref 70–99)
Glucose-Capillary: 139 mg/dL — ABNORMAL HIGH (ref 70–99)

## 2013-01-07 LAB — CULTURE, BAL-QUANTITATIVE W GRAM STAIN: Colony Count: 30000

## 2013-01-07 LAB — MAGNESIUM: Magnesium: 3 mg/dL — ABNORMAL HIGH (ref 1.5–2.5)

## 2013-01-07 MED ORDER — CEFTRIAXONE SODIUM 1 G IJ SOLR
1.0000 g | Freq: Two times a day (BID) | INTRAMUSCULAR | Status: AC
  Administered 2013-01-07 – 2013-01-08 (×3): 1 g via INTRAVENOUS
  Filled 2013-01-07 (×4): qty 10

## 2013-01-07 MED ORDER — HEPARIN SODIUM (PORCINE) 1000 UNIT/ML IJ SOLN
INTRAMUSCULAR | Status: AC
  Administered 2013-01-07: 1400 [IU]
  Filled 2013-01-07: qty 3

## 2013-01-07 NOTE — Progress Notes (Signed)
ANTIBIOTIC CONSULT NOTE - INITIAL  Pharmacy Consult for Ceftriaxone Indication: pneumonia  No Known Allergies  Patient Measurements: Height: 6' (182.9 cm) Weight: 263 lb 7.2 oz (119.5 kg) IBW/kg (Calculated) : 77.6   Vital Signs: Temp: 99 F (37.2 C) (08/02 1600) Temp src: Oral (08/02 1600) BP: 110/62 mmHg (08/02 1700) Pulse Rate: 93 (08/02 1547) Intake/Output from previous day: 08/01 0701 - 08/02 0700 In: 1780.1 [I.V.:640.1; NG/GT:640; IV Piggyback:500] Out: 5584 [Urine:25; Stool:575] Intake/Output from this shift: Total I/O In: 823.5 [I.V.:203.5; NG/GT:370; IV Piggyback:250] Out: 1964 [Other:1964]  Labs:  Recent Labs  01/06/13 01/06/13 0500 01/06/13 1600 01/07/13 0400 01/07/13 1600  WBC 25.6* 26.3*  --  21.2*  --   HGB 9.8* 10.0*  --  10.0*  --   PLT 106* 109*  --  155  --   CREATININE 3.19* 3.28* 3.33* 3.61* 3.67*   Estimated Creatinine Clearance: 31.1 ml/min (by C-G formula based on Cr of 3.67).    Microbiology: Recent Results (from the past 720 hour(s))  URINE CULTURE     Status: None   Collection Time    12/29/12  3:08 PM      Result Value Range Status   Specimen Description URINE, CATHETERIZED   Final   Special Requests NONE   Final   Culture  Setup Time 12/29/2012 23:00   Final   Colony Count NO GROWTH   Final   Culture NO GROWTH   Final   Report Status 12/30/2012 FINAL   Final  CULTURE, BLOOD (ROUTINE X 2)     Status: None   Collection Time    12/29/12  3:27 PM      Result Value Range Status   Specimen Description BLOOD HAND LEFT   Final   Special Requests BOTTLES DRAWN AEROBIC ONLY 5CC   Final   Culture  Setup Time 12/29/2012 22:48   Final   Culture NO GROWTH 5 DAYS   Final   Report Status 01/04/2013 FINAL   Final  CULTURE, BLOOD (ROUTINE X 2)     Status: None   Collection Time    12/29/12  3:37 PM      Result Value Range Status   Specimen Description BLOOD ARM LEFT   Final   Special Requests BOTTLES DRAWN AEROBIC ONLY 2CC   Final   Culture  Setup Time 12/29/2012 22:48   Final   Culture NO GROWTH 5 DAYS   Final   Report Status 01/04/2013 FINAL   Final  URINE CULTURE     Status: None   Collection Time    12/29/12  7:11 PM      Result Value Range Status   Specimen Description URINE, CATHETERIZED   Final   Special Requests NONE   Final   Culture  Setup Time 12/29/2012 21:09   Final   Colony Count NO GROWTH   Final   Culture NO GROWTH   Final   Report Status 12/31/2012 FINAL   Final  MRSA PCR SCREENING     Status: None   Collection Time    12/30/12  2:24 AM      Result Value Range Status   MRSA by PCR NEGATIVE  NEGATIVE Final   Comment:            The GeneXpert MRSA Assay (FDA     approved for NASAL specimens     only), is one component of a     comprehensive MRSA colonization     surveillance program. It is  not     intended to diagnose MRSA     infection nor to guide or     monitor treatment for     MRSA infections.  CULTURE, RESPIRATORY (NON-EXPECTORATED)     Status: None   Collection Time    12/30/12 11:51 AM      Result Value Range Status   Specimen Description TRACHEAL ASPIRATE   Final   Special Requests NONE   Final   Gram Stain     Final   Value: RARE WBC PRESENT, PREDOMINANTLY PMN     NO SQUAMOUS EPITHELIAL CELLS SEEN     NO ORGANISMS SEEN   Culture Non-Pathogenic Oropharyngeal-type Flora Isolated.   Final   Report Status 01/02/2013 FINAL   Final  CLOSTRIDIUM DIFFICILE BY PCR     Status: None   Collection Time    01/03/13  4:35 PM      Result Value Range Status   C difficile by pcr NEGATIVE  NEGATIVE Final  CULTURE, BAL-QUANTITATIVE     Status: None   Collection Time    01/04/13  3:35 PM      Result Value Range Status   Specimen Description BRONCHIAL ALVEOLAR LAVAGE   Final   Special Requests BRONCH WASH LL LOBE    Final   Gram Stain     Final   Value: RARE WBC PRESENT, PREDOMINANTLY PMN     NO SQUAMOUS EPITHELIAL CELLS SEEN     NO ORGANISMS SEEN   Colony Count 30,000 COLONIES/ML    Final   Culture     Final   Value: STAPHYLOCOCCUS AUREUS     Note: RIFAMPIN AND GENTAMICIN SHOULD NOT BE USED AS SINGLE DRUGS FOR TREATMENT OF STAPH INFECTIONS.   Report Status 01/07/2013 FINAL   Final   Organism ID, Bacteria STAPHYLOCOCCUS AUREUS   Final    Medical History: Past Medical History  Diagnosis Date  . Hyperlipidemia   . OSA (obstructive sleep apnea)   . CVA (cerebral infarction)     Assessment: 89 YOM admitted with oxycodone overdose which has been complicated by cardiac arrest. BAL has grown out MSSA and he is switched from vancomycin to ceftriaxone. He remains on CVVH.  Goal of Therapy:  Eradication of infection  Plan:  1. Start ceftriaxone 1g IV Q12h 2. Follow up renal function and transition off CVVH 3. Follow up LOT and clinical course  Ulah Olmo D. Wilhemenia Camba, PharmD Clinical Pharmacist Pager: [phone] 01/07/2013 5:40 PM

## 2013-01-07 NOTE — Progress Notes (Signed)
Patient ID: Nicholas Potts, male   DOB: 1960/07/06, 52 y.o.   MRN: [id number]   Subjective:  Patient remains intubated, responds to commands  Objective:  Vital Signs in the last 24 hours: Temp:  [98.4 F (36.9 C)-99.8 F (37.7 C)] 98.9 F (37.2 C) (08/02 0400) Pulse Rate:  [80-90] 90 (08/02 0806) Resp:  [21-33] 24 (08/02 0806) BP: (83-142)/(43-69) 107/48 mmHg (08/02 0806) SpO2:  [95 %-100 %] 98 % (08/02 0806) FiO2 (%):  [40 %] 40 % (08/02 0806) Weight:  [263 lb 7.2 oz (119.5 kg)] 263 lb 7.2 oz (119.5 kg) (08/02 0441)  Intake/Output from previous day: 08/01 0701 - 08/02 0700 In: 1780.1 [I.V.:640.1; NG/GT:640; IV Piggyback:500] Out: 5584 [Urine:25; Stool:575] Intake/Output from this shift: Total I/O In: -  Out: 235 [Other:235]  . antiseptic oral rinse  15 mL Mouth Rinse QID  . carvedilol  3.125 mg Per NG tube BID WC  . chlorhexidine  15 mL Mouth Rinse BID  . darbepoetin (ARANESP) injection - NON-DIALYSIS  150 mcg Subcutaneous Q Mon-1800  . feeding supplement  60 mL Oral 5 X Daily  . heparin subcutaneous  5,000 Units Subcutaneous Q8H  . hydrALAZINE  25 mg Oral Q8H  . insulin aspart  1 Units Subcutaneous Q4H  . insulin aspart  1-3 Units Subcutaneous Q4H  . insulin glargine  10 Units Subcutaneous Q24H  . isosorbide dinitrate  10 mg Per Tube TID  . pantoprazole sodium  40 mg Per Tube Q1200  . sodium chloride  10-40 mL Intracatheter Q12H  . vancomycin  1,250 mg Intravenous Q24H   . sodium chloride Stopped (01/01/13 1400)  . sodium chloride Stopped (12/31/12 2300)  . dextrose    . feeding supplement (PROMOTE) 1,000 mL (01/06/13 1400)  . fentaNYL infusion INTRAVENOUS 75 mcg/hr (01/06/13 1330)  . dialysis replacement fluid (prismasate) 500 mL/hr at 01/07/13 0420  . dialysis replacement fluid (prismasate) 300 mL/hr at 01/07/13 0419  . dialysate (PRISMASATE) 1,500 mL/hr at 01/07/13 0534  . propofol 15 mcg/kg/min (01/07/13 0400)    Physical Exam: The patient is  intubated. Anasarca has resolved  Neck with dialysis and central venous catheter  Chest CTA anteriorly  Heart sounds are very distant. No murmur gallop or rub.  Abd mildly distended, no masses Scrotal edema improving  Extremities reveal trace edema  Neurologic exam - patient can move all extremities and appears to understand conversation despite limited english  Lab Results:  Recent Labs  01/06/13 0500 01/07/13 0400  WBC 26.3* 21.2*  HGB 10.0* 10.0*  PLT 109* 155    Recent Labs  01/06/13 1600 01/07/13 0400  NA 136 135  K 4.3 4.3  CL 99 97  CO2 23 22  GLUCOSE 138* 115*  BUN 70* 74*  CREATININE 3.33* 3.61*     Recent Labs  01/06/13 0500  01/07/13 0400  PROT 7.1  --   --   ALBUMIN 2.5*  < > 2.5*  AST 58*  --   --   ALT 321*  --   --   ALKPHOS 249*  --   --   BILITOT 0.7  --   --   < > = values in this interval not displayed.       Assessment/Plan:  1. Cardiogenic shock- improved; nonobstructive CAD by cath. Severe LV dsyfunction. Continue hydralazine/nitrates; no ACEI for now given acute renal insuff. Continue coreg 3.125 mg BID. Increase meds as tolerated by pulse and BP; repeat echo in 8 weeks to see if  LV function has improved. 2. S/P respiratory and cardiac arrest-felt to most likely be respiratory arrest related to sedatives and OSA followed by aspiration. We were not able to completely rule out pulmonary embolus. Lower ext dopplers neg. Heparin DCed given acute blood loss anemia and inability to transfuse (Jehovah's witness) as well as thrombocytopenia.  3. Acute renal failure-most likely ATN from cardiac arrest/cardiogenic shock; also probable contribution from contrast,  patient remains volume overloaded but improving with CVVHD; little urine output; nephrology managing. His weight is down over 30 lbs. 4. VDRF-management per CCM, weaning trials ongoing 5. Acute blood loss anemia-patient is Jehovah's witness. 6. Thrombocytopenia-Plts improving; HIT  pending. If neg, would resume DVT prophylaxis heparin 7. Pneumonia-continue antibiotics.            LOS: 9 days    Cori Justus,M.D. 01/07/2013, 8:29 AM

## 2013-01-07 NOTE — Progress Notes (Signed)
Dr.Shertz notified that CRRT was starting to clot off.100 cc given back. HD cath capped off and filled with Heparin as ordered with another RN supervising

## 2013-01-07 NOTE — Progress Notes (Signed)
[facility] Rounding Note  Subjective: CRRT pulling - 150/hour continues to tolerate well BP remains stable off pressors and dobutamine Staph in BAL Vancoymicin added Moves ext's and nods, grips during wakeup eval  O:BP 128/65  Pulse 80  Temp(Src) 98.9 F (37.2 C) (Oral)  Resp 26  Ht 6' (1.829 m)  Wt 119.5 kg (263 lb 7.2 oz)  BMI 35.72 kg/m2  SpO2 97%  Intake/Output Summary (Last 24 hours) at 01/07/13 0750 Last data filed at 01/07/13 0700  Gross per 24 hour  Intake 1780.07 ml  Output   5584 ml  Net -3803.93 ml   Weight change: -2.8 kg (-6 lb 2.8 oz) UJW:JXBJYNWGN, sedated but was awake earlier per daughter and able to write down fam member's name right IJ catheter (7/26) intact FAO:ZHYQMVH 80's Resp:clear anteriorly  Abd:+ BS obese, mild distension, softer, No obvious pain with palpation QIO:NGEXB edemaleft/ right leg splinted NEURO:when sedation down - nods, grips, moves UE's,  Minimal amt dark muddy brown urine in foley Scrotal edema much better Ecchymotic left groin from prior  Impella device Large amount liquid stool in flexiseal Foley - a feww cc's of muddy brown urine  WEIGHTS: 01/07/13 0441 119.5 kg  01/06/13 0448 122.3 kg 01/05/13 0500 127.1 kg  01/04/13 0400 130.9 kg  01/03/13 0500 136.6 kg  (incr neg 150/hr w/CRRT) 01/02/13 0500 138.5 kg  (incr neg 100/hr w/CRRT) 01/01/13 0441 141 kg   12/31/12 0500 141.4 kg   12/30/12 0500 135.8 kg   (CRRT started neg 50/hour) 12/29/12 2300 133.3 kg   12/29/12 1708 121.564 kg (1st post rescuscitation weight)  MEDICATIONS  . antiseptic oral rinse  15 mL Mouth Rinse QID  . carvedilol  3.125 mg Per NG tube BID WC  . chlorhexidine  15 mL Mouth Rinse BID  . darbepoetin (ARANESP) injection - NON-DIALYSIS  150 mcg Subcutaneous Q Mon-1800  . feeding supplement  60 mL Oral 5 X Daily  . heparin subcutaneous  5,000 Units Subcutaneous Q8H  . hydrALAZINE  25 mg Oral Q8H  . insulin aspart  1 Units Subcutaneous  Q4H  . insulin aspart  1-3 Units Subcutaneous Q4H  . insulin glargine  10 Units Subcutaneous Q24H  . isosorbide dinitrate  10 mg Per Tube TID  . pantoprazole sodium  40 mg Per Tube Q1200  . sodium chloride  10-40 mL Intracatheter Q12H  . vancomycin  1,250 mg Intravenous Q24H   . sodium chloride Stopped (01/01/13 1400)  . sodium chloride Stopped (12/31/12 2300)  . dextrose    . feeding supplement (PROMOTE) 1,000 mL (01/06/13 1400)  . fentaNYL infusion INTRAVENOUS 75 mcg/hr (01/06/13 1330)  . dialysis replacement fluid (prismasate) 500 mL/hr at 01/07/13 0420  . dialysis replacement fluid (prismasate) 300 mL/hr at 01/07/13 0419  . dialysate (PRISMASATE) 1,500 mL/hr at 01/07/13 0534  . propofol 15 mcg/kg/min (01/07/13 0400)     Recent Labs  01/06/13 0500 01/06/13 1600 01/07/13 0400  NA 138 136 135  K 4.3 4.3 4.3  CL 99 99 97  CO2 23 23 22   GLUCOSE 106* 138* 115*  BUN 66* 70* 74*  CREATININE 3.28* 3.33* 3.61*  CALCIUM 8.9 8.6 8.7  MG 3.1*  --  3.0*  PHOS 5.3* 5.5* 6.1*     Recent Labs  01/05/13 0500  01/06/13 0500 01/06/13 1600 01/07/13 0400  AST 62*  --  58*  --   --   ALT 477*  --  321*  --   --   ALKPHOS  242*  --  249*  --   --   BILITOT 0.8  --  0.7  --   --   PROT 6.5  --  7.1  --   --   ALBUMIN 2.2*  < > 2.5* 2.4* 2.5*  < > = values in this interval not displayed.    Recent Labs  01/06/13 0500 01/07/13 0400  WBC 26.3* 21.2*  HGB 10.0* 10.0*  HCT 30.1* 31.1*  MCV 95.9 96.9  PLT 109* 155    ABG    Component Value Date/Time   PHART 7.244* 01/06/2013 0927   PCO2ART 60.2* 01/06/2013 0927   PO2ART 77.0* 01/06/2013 0927   HCO3 26.0* 01/06/2013 0927   TCO2 28 01/06/2013 0927   ACIDBASEDEF 2.0 01/06/2013 0927   O2SAT 92.0 01/06/2013 0927   Microbiology Results    Procedure Component Value Units Date/Time   Culture, bal-quantitative [[id number]] Collected: 01/04/13 1535   Order Status: Completed  Updated: 01/06/13 0800    Specimen Description BRONCHIAL ALVEOLAR  LAVAGE     Special Requests BRONCH WASH LL LOBE      Gram Stain -     Result:     RARE WBC PRESENT, PREDOMINANTLY PMN NO SQUAMOUS EPITHELIAL CELLS SEEN NO ORGANISMS SEEN    Colony Count 30,000 COLONIES/ML     Culture -     Result:     STAPHYLOCOCCUS AUREUS Note: RIFAMPIN AND GENTAMICIN SHOULD NOT BE USED AS SINGLE DRUGS FOR TREATMENT OF STAPH INFECTIONS.    Report Status PENDING      Dg Chest Port 1 View  01/07/2013   *RADIOLOGY REPORT*  Clinical Data: Assess infiltrate  PORTABLE CHEST - 1 VIEW  Comparison: Prior radiograph from 01/06/2013  Findings: The the patient remains intubated with the tip of the endotracheal tube located 2.9 cm above the carina.  Enteric tube courses into the abdomen, and appears coiled within the gastric fundus, similar to prior. Right IJ central venous catheter is unchanged.  Left subclavian central venous catheter is in stable position with tip at cavoatrial junction.  Heart size is unchanged.  The lungs remain hypoinflated.  Bilateral layering pleural effusions persist, likely capping the right lung apex.  Associated bibasilar airspace opacities are also not significantly changed, which may read represent atelectasis or infiltrate.  No overt pulmonary edema.  No pneumothorax.  The bony structures are intact.  IMPRESSION: 1.  Support apparatus as above.  Tip of endotracheal tube 2.9 cm above the carina. 2.  Similar appearance of the chest with persistent hypoinflation with bilateral pleural effusions and associated bibasilar airspace opacities, which may reflect atelectasis or infiltrate.   Original Report Authenticated By: Rise Mu, M.D.   Dg Chest Port 1 View  01/06/2013   *RADIOLOGY REPORT*  Clinical Data: Pleural effusions.  PORTABLE CHEST - 1 VIEW  Comparison: 01/05/2013.  Findings: Endotracheal tube ends 2 cm above the carina.  Gastric suction tube is coiled in the stomach.  The right IJ sheath has been retracted slightly, still in good position at the  level of the upper SVC. Left subclavian central venous catheter, tip at the level of the superior cavoatrial junction.  Unchanged heart size and mediastinal contours, distorted by low lung volumes.  Layering bilateral pleural effusions, likely capping the apical lungs.  There is likely also a lung opacity at the left base. No evident pneumothorax.  IMPRESSION:  1.  Satisfactory positioning of tubes and lines.  Please note that the right IJ catheter has been retracted since  yesterday. 2. Low volume lungs with bilateral pleural effusions and left base atelectasis or infiltrate.   Original Report Authenticated By: Tiburcio Pea   Assessment/Recommendations  52 yo male SP Rt ankle surgery 7/20 who presented to ER with AMS thought to be due to narcotic excess,  coded 3 times in the ED,  taken emergently to cath lab and had Rt and Lt heart cath(nonobstructive ds/EF 20%) and placement of impella device (now removed). Scr 1.76 on adm with unknown baseline, with development of ischemic ATN requiring initiation of CRRT (7/25)   Acute vs acute on chronic renal failure (baseline creatinine not known) sec to ischemic ATN from hypotension/cardiogenic shock +/- IV contrast.   Pressors/inotropes off Impella device out.  No evidence at this time of any renal functional recovery  Volume status MUCH improved with CRRT  Continue CRRT (started 7/25) at neg 150/hour for at least another 24 hours then probably can transition to IHD IJ temp cath in since 7/25 --> first of the week will arrange for placement of tunnelled HD catheter Continue with all 4K fluids No heparin d/t Jehovah Witness status Added aranesp 150/week for anemia (7/28)since cannot transfuse and Hb improved/stable around 10  Iron low not repleted with ferritin of nearly 7000  Dashley Monts B

## 2013-01-07 NOTE — Progress Notes (Signed)
Subjective: Pt intubated and sedated.  Notes reviewed.   Objective: Vital signs in last 24 hours: Temp:  [98.4 F (36.9 C)-99.8 F (37.7 C)] 98.5 F (36.9 C) (08/02 0800) Pulse Rate:  [80-90] 90 (08/02 0806) Resp:  [21-28] 26 (08/02 0900) BP: (83-142)/(43-69) 114/66 mmHg (08/02 0900) SpO2:  [94 %-100 %] 94 % (08/02 0900) FiO2 (%):  [40 %] 40 % (08/02 0806) Weight:  [119.5 kg (263 lb 7.2 oz)] 119.5 kg (263 lb 7.2 oz) (08/02 0441)  Intake/Output from previous day: 08/01 0701 - 08/02 0700 In: 1780.1 [I.V.:640.1; NG/GT:640; IV Piggyback:500] Out: 5584 [Urine:25; Stool:575] Intake/Output this shift: Total I/O In: 107 [I.V.:37; NG/GT:70] Out: 438 [Other:438]   Recent Labs  01/04/13 2330 01/05/13 0500 01/06/13 01/06/13 0500 01/07/13 0400  HGB 9.4* 10.1* 9.8* 10.0* 10.0*    Recent Labs  01/06/13 0500 01/07/13 0400  WBC 26.3* 21.2*  RBC 3.14* 3.21*  HCT 30.1* 31.1*  PLT 109* 155    Recent Labs  01/06/13 1600 01/07/13 0400  NA 136 135  K 4.3 4.3  CL 99 97  CO2 23 22  BUN 70* 74*  CREATININE 3.33* 3.61*  GLUCOSE 138* 115*  CALCIUM 8.6 8.7   No results found for this basename: LABPT, INR,  in the last 72 hours  R LE splinted.  Brisk cap refill at toes.  Assessment/Plan: 2 weeks s/p R achilles tendon debridement - I'll continue to follow along.   Toni Arthurs 01/07/2013, 9:53 AM

## 2013-01-07 NOTE — Progress Notes (Signed)
PULMONARY  / CRITICAL CARE MEDICINE  Name: Nicholas Potts MRN: [id number] DOB: 1961-01-13    ADMISSION DATE:  12/29/2012  REFERRING MD :  EDP- Dr. Dan Humphreys PRIMARY SERVICE: PCCM  CHIEF COMPLAINT:  Acute Overdose  BRIEF PATIENT DESCRIPTION: [name] who presented to [facility] ER on 7/24 with AMS related to oxycodone overdose.  Underwent R achilles tendon reconstruction 7/20 with RLE cast in place on admit.  COurse complicated by respiratory arrest initially, then full cardiac arrest. He had episodes of ventricular tachycardia and was defibrillated . He had CPR with return of spontaneous circulation. Emergent cath >> EF 20%, mild non obstructive CAD. Required placement of Impella device for cardiogenic shock  SIGNIFICANT EVENTS / STUDIES:  7/20 - R ankle surgery (? Type, no [facility] Records) ........................................................................................................ 7/24 - Admit to [facility] with ER with acute oxy IR OD 7/25 duplex neg DVT 7/26- impella out 7/28-Dobutamine 7/28 eeg- This EEG is abnormal with moderately severe generalized nonspecific slowing of cerebral activity, which can be seen with a wide variety of encephalopathic processes. No epileptic activity was recorded.  7/29 - vent settings improved to peep 5, neg 2.5 liters cvvhd 7/30 upper ext doppler US - no obvious evidence of DVT of R or L upper ext, appears to be superficial thrombosis in R basilic and antecubital communicating veins 7/30 bronch - no significant secretions, no major distal plugging, BAL RLL, clear, white 8/1- weaning well, excellent neurostatus, neg 3.7 liters  LINES / TUBES: L Anderson TLC 7/24>>> OETT 7/24>>> Aline 7/24>>>729 Femoral swan ganz 7/24>>>7/27 Impella device 7/24>>>7/26  CULTURES: Sputum 7/25>>>non-pathogenic oropharyngeal-type flora insolated (rare WBC present, mostly PMN) BCx2 7/24>>>ng UC 7/24>>>ng C. Diff 7/29>>>neg Bronch bal 7/30>>>rare WBC, mostly PMN>>>Staph  aureus  ANTIBIOTICS: Vanco 7/24>>>>7/28 Zosyn 7/2/4>>>7/28 Ceftriaxone 7/28>>>7/31 Vanco 8/1>>>  SUBJECTIVE:   Tolerating pressure support.  Still has increased secretions.  VITAL SIGNS: Temp:  [98.4 F (36.9 C)-99.8 F (37.7 C)] 98.9 F (37.2 C) (08/02 0400) Pulse Rate:  [80-90] 90 (08/02 0806) Resp:  [21-33] 24 (08/02 0806) BP: (83-142)/(43-69) 107/48 mmHg (08/02 0806) SpO2:  [95 %-100 %] 98 % (08/02 0806) FiO2 (%):  [40 %] 40 % (08/02 0806) Weight:  [263 lb 7.2 oz (119.5 kg)] 263 lb 7.2 oz (119.5 kg) (08/02 0441)  HEMODYNAMICS: CVP:  [4 mmHg-7 mmHg] 7 mmHg  VENTILATOR SETTINGS: Vent Mode:  [-] PSV;CPAP FiO2 (%):  [40 %] 40 % Set Rate:  [24 bmp] 24 bmp Vt Set:  [550 mL] 550 mL PEEP:  [5 cmH20] 5 cmH20 Pressure Support:  [10 cmH20] 10 cmH20 Plateau Pressure:  [18 cmH20-20 cmH20] 18 cmH20  INTAKE / OUTPUT: Intake/Output     08/01 0701 - 08/02 0700 08/02 0701 - 08/03 0700   I.V. (mL/kg) 640.1 (5.4)    NG/GT 640    IV Piggyback 500    Total Intake(mL/kg) 1780.1 (14.9)    Urine (mL/kg/hr) 25 (0)    Other 4984 (1.7) 235 (1.4)   Stool 575 (0.2)    Total Output 5584 235   Net -3803.9 -235          PHYSICAL EXAMINATION: General: No distress Neuro:  Follows commands, moves extremities, RASS 0 HEENT: ETT in place Cardiovascular: regular Lungs: scattered rhonchi Abdomen: soft, non tender Musculoskeletal:  No edema, RLE with partial hard cast Skin: no rashes  LABS: CBC Recent Labs    01/06/13  01/06/13  0500  01/07/13  0400  WBC  25.6*  26.3*  21.2*  HGB  9.8*  10.0*  10.0*  HCT  28.8*  30.1*  31.1*  PLT  106*  109*  155   BMET Recent Labs     01/06/13  0500  01/06/13  1600  01/07/13  0400  NA  138  136  135  K  4.3  4.3  4.3  CL  99  99  97  CO2  23  23  22   BUN  66*  70*  74*  CREATININE  3.28*  3.33*  3.61*  GLUCOSE  106*  138*  115*    Electrolytes Recent Labs     01/05/13  0500   01/06/13  0500  01/06/13  1600  01/07/13  0400   CALCIUM  8.7   < >  8.9  8.6  8.7  MG  3.1*   --   3.1*   --   3.0*  PHOS  4.0   < >  5.3*  5.5*  6.1*   < > = values in this interval not displayed.   ABG Recent Labs     01/05/13  0500  01/05/13  0508  01/06/13  0927  PHART  TEST WILL BE CREDITED  7.353  7.244*  PCO2ART  TEST WILL BE CREDITED  41.6  60.2*  PO2ART  TEST WILL BE CREDITED  67.0*  77.0*    Liver Enzymes Recent Labs     01/05/13  0500   01/06/13  0500  01/06/13  1600  01/07/13  0400  AST  62*   --   58*   --    --   ALT  477*   --   321*   --    --   ALKPHOS  242*   --   249*   --    --   BILITOT  0.8   --   0.7   --    --   ALBUMIN  2.2*   < >  2.5*  2.4*  2.5*   < > = values in this interval not displayed.   Glucose Recent Labs     01/06/13  0718  01/06/13  1152  01/06/13  1604  01/06/13  1956  01/06/13  2319  01/07/13  0416  GLUCAP  128*  138*  132*  119*  135*  107*    Imaging Dg Chest Port 1 View  01/07/2013   *RADIOLOGY REPORT*  Clinical Data: Assess infiltrate  PORTABLE CHEST - 1 VIEW  Comparison: Prior radiograph from 01/06/2013  Findings: The the patient remains intubated with the tip of the endotracheal tube located 2.9 cm above the carina.  Enteric tube courses into the abdomen, and appears coiled within the gastric fundus, similar to prior. Right IJ central venous catheter is unchanged.  Left subclavian central venous catheter is in stable position with tip at cavoatrial junction.  Heart size is unchanged.  The lungs remain hypoinflated.  Bilateral layering pleural effusions persist, likely capping the right lung apex.  Associated bibasilar airspace opacities are also not significantly changed, which may read represent atelectasis or infiltrate.  No overt pulmonary edema.  No pneumothorax.  The bony structures are intact.  IMPRESSION: 1.  Support apparatus as above.  Tip of endotracheal tube 2.9 cm above the carina. 2.  Similar appearance of the chest with persistent hypoinflation with bilateral  pleural effusions and associated bibasilar airspace opacities, which may reflect atelectasis or infiltrate.   Original Report Authenticated By: Rise Mu, M.D.   Dg  Chest Port 1 View  01/06/2013   *RADIOLOGY REPORT*  Clinical Data: Pleural effusions.  PORTABLE CHEST - 1 VIEW  Comparison: 01/05/2013.  Findings: Endotracheal tube ends 2 cm above the carina.  Gastric suction tube is coiled in the stomach.  The right IJ sheath has been retracted slightly, still in good position at the level of the upper SVC. Left subclavian central venous catheter, tip at the level of the superior cavoatrial junction.  Unchanged heart size and mediastinal contours, distorted by low lung volumes.  Layering bilateral pleural effusions, likely capping the apical lungs.  There is likely also a lung opacity at the left base. No evident pneumothorax.  IMPRESSION:  1.  Satisfactory positioning of tubes and lines.  Please note that the right IJ catheter has been retracted since yesterday. 2. Low volume lungs with bilateral pleural effusions and left base atelectasis or infiltrate.   Original Report Authenticated By: Tiburcio Pea      ASSESSMENT / PLAN:  PULMONARY A: Acute respiratory failure in setting of aspiration pneumonia and acute pulmonary edema from accidental opiate overdose. P:   -pressure support wean as tolerated >> not ready for extubation yet -f/u CXR -will need CPAP/BiPAP after extubation  CARDIOVASCULAR A: Acute systolic CHF - EF 35%. Cardiogenic shock >> resolved. VT cardiac arrest >> resolved. HTN. Hyperlipidemia. P:  -continue coreg, hydralazine isosorbide -echo in 8 weeks from initial event -hold simvastatin until LFT's improved  RENAL A: Acute kidney injury 2nd to shock. Hyperkalemia >> resolved. Lactic acidosis >> resolved. P:   -continue CRRT >>plan to transition to intermittent HD 8/02 or 8/03   GASTROINTESTINAL A: Nutrition. Elevated LFT's 2nd to shock >> improved. P:    -Protonix for SUP -tube feeds while on vent -f/u LFT's  HEMATOLOGIC A: Anemia >> Jehovah's witness. Thrombocytopenia >> improved, HITT negative. P:  -Continue folate/MVI/thiamine -SQ heparin for DVT prevention  INFECTIOUS A: Aspiration pneumonia >> Staph aureus in BAL from 7/30. P:  -continue vancomycin pending cx results  ENDOCRINE A: Hyperglycemia. P:   -SSI  NEUROLOGIC A: Acute encephalopathy 2nd to VT arrest, opiate overdose, renal failure >> much improved. P:   -limit sedation while weaning vent >> goal RASS 0  Today's summary: goal to neg again today, continue neg balance, continue weaning trials, consider extubating today  Updated family at bedside.  Discussed with Dr. Eliott Nine.  CC time 35 minutes.  Coralyn Helling, MD [facility] Pulmonary/Critical Care 01/07/2013, 8:35 AM Pager:  [phone] After 3pm call: [phone]

## 2013-01-07 NOTE — Significant Event (Signed)
BAL from 7/30 with MSSA.  Will d/c vancomycin and change to rocephin.  Coralyn Helling, MD [facility] Pulmonary/Critical Care 01/07/2013, 5:18 PM Pager:  [phone] After 3pm call: [phone]

## 2013-01-08 ENCOUNTER — Inpatient Hospital Stay (HOSPITAL_COMMUNITY): Payer: BC Managed Care – PPO

## 2013-01-08 DIAGNOSIS — N186 End stage renal disease: Secondary | ICD-10-CM

## 2013-01-08 LAB — CBC
HCT: 30.7 % — ABNORMAL LOW (ref 39.0–52.0)
Hemoglobin: 9.8 g/dL — ABNORMAL LOW (ref 13.0–17.0)
MCV: 97.2 fL (ref 78.0–100.0)
RBC: 3.16 MIL/uL — ABNORMAL LOW (ref 4.22–5.81)
WBC: 20 10*3/uL — ABNORMAL HIGH (ref 4.0–10.5)

## 2013-01-08 LAB — HEPATIC FUNCTION PANEL
AST: 61 U/L — ABNORMAL HIGH (ref 0–37)
Albumin: 2.5 g/dL — ABNORMAL LOW (ref 3.5–5.2)
Alkaline Phosphatase: 180 U/L — ABNORMAL HIGH (ref 39–117)
Total Bilirubin: 0.5 mg/dL (ref 0.3–1.2)
Total Protein: 7.4 g/dL (ref 6.0–8.3)

## 2013-01-08 LAB — GLUCOSE, CAPILLARY
Glucose-Capillary: 146 mg/dL — ABNORMAL HIGH (ref 70–99)
Glucose-Capillary: 119 mg/dL — ABNORMAL HIGH (ref 70–99)
Glucose-Capillary: 116 mg/dL — ABNORMAL HIGH (ref 70–99)

## 2013-01-08 LAB — BASIC METABOLIC PANEL
CO2: 20 mEq/L (ref 19–32)
Calcium: 8.3 mg/dL — ABNORMAL LOW (ref 8.4–10.5)
Chloride: 96 mEq/L (ref 96–112)
Creatinine, Ser: 4.82 mg/dL — ABNORMAL HIGH (ref 0.50–1.35)
Glucose, Bld: 120 mg/dL — ABNORMAL HIGH (ref 70–99)

## 2013-01-08 MED ORDER — FENTANYL CITRATE 0.05 MG/ML IJ SOLN
25.0000 ug | INTRAMUSCULAR | Status: DC | PRN
  Administered 2013-01-09: 25 ug via INTRAVENOUS
  Administered 2013-01-09: 100 ug via INTRAVENOUS
  Filled 2013-01-08 (×2): qty 2

## 2013-01-08 MED ORDER — DEXTROSE 5 % IV SOLN
2.0000 g | INTRAVENOUS | Status: DC
  Administered 2013-01-09 – 2013-01-11 (×3): 2 g via INTRAVENOUS
  Filled 2013-01-08 (×3): qty 2

## 2013-01-08 MED ORDER — INSULIN ASPART 100 UNIT/ML ~~LOC~~ SOLN
0.0000 [IU] | SUBCUTANEOUS | Status: DC
  Administered 2013-01-10: 2 [IU] via SUBCUTANEOUS
  Administered 2013-01-10: 1 [IU] via SUBCUTANEOUS

## 2013-01-08 MED ORDER — PANTOPRAZOLE SODIUM 40 MG IV SOLR
40.0000 mg | INTRAVENOUS | Status: DC
  Administered 2013-01-09: 40 mg via INTRAVENOUS
  Filled 2013-01-08 (×2): qty 40

## 2013-01-08 MED ORDER — METOPROLOL TARTRATE 1 MG/ML IV SOLN
5.0000 mg | INTRAVENOUS | Status: DC | PRN
  Filled 2013-01-08: qty 5

## 2013-01-08 MED ORDER — HYDRALAZINE HCL 20 MG/ML IJ SOLN: 10.0000 mg | INTRAMUSCULAR | Status: DC | PRN

## 2013-01-08 NOTE — Procedures (Signed)
Extubation Procedure Note  Patient Details:   Name: Nicholas Potts DOB: 23-Mar-1961 MRN: [id number]   Airway Documentation:  Airway 8 mm (Active)  Secured at (cm) 24 cm 01/08/2013  7:42 AM  Measured From Lips 01/08/2013  7:42 AM  Secured Location Left 01/08/2013  7:42 AM  Secured By [employer] 01/08/2013  7:42 AM  Tube Holder Repositioned Yes 01/08/2013  7:42 AM  Cuff Pressure (cm H2O) 22 cm H2O 01/07/2013  7:51 PM  Site Condition Dry 01/08/2013  7:42 AM    Evaluation  O2 sats: stable throughout Complications: No apparent complications Patient did tolerate procedure well. Bilateral Breath Sounds: Clear Suctioning: Airway Yes.   Extubated per Dr. Jarrett Ables.  Pt 's vitals are good  Nicholas Potts V 01/08/2013, 10:18 AM

## 2013-01-08 NOTE — Evaluation (Signed)
Clinical/Bedside Swallow Evaluation Patient Details  Name: Nicholas Potts MRN: [id number] Date of Birth: 01-Feb-1961  Today's Date: 01/08/2013 Time: 9562-1308 SLP Time Calculation (min): 25 min  Past Medical History:  Past Medical History  Diagnosis Date  . Hyperlipidemia   . OSA (obstructive sleep apnea)   . CVA (cerebral infarction)    Past Surgical History:  Past Surgical History  Procedure Laterality Date  . Achilles tendon repair     HPI:  History, review of systems, and physical exam are limited due to patient's status and acuity. Patient with recent left lower extremity surgery 4 days ago, presents via EMS with altered mental status. Per family, his postoperative course has been somewhat complicated. Just after surgery, he had some difficulty waking up after anesthesia. He required overnight hospitalizations. Since then he has continued to use his pain medications as directed; however, he has been somewhat somnolent over the last couple of days. Family noted that he had some episodes of vomiting overnight, appeared diaphoretic this morning and was less arousable. When EMS arrived to the house, he was apneic. Narcan was given without much effect.  Intubated 12/29/12 to 01/08/13.   Assessment / Plan / Recommendation Clinical Impression  Suspected reversable pharyngeal dysphagia.  Evaluation limited to ice chips and thin water by spoon/cup due to s/s present and overall decreased reserve.  Decrease in initiation of swallow with outward s/s of penetration vs. aspiration  s/p swallow of thin water.  Change in vital signs with increased WOB s/p PO trials. Due to risk factors of lengthy intubation and current weakness recommend continued NPO status with exception of ice chips sparingly s/p oral care.  ST to reassess for PO readiness on 01/09/13.  Sharee Pimple ability to resume PO's good    Aspiration Risk  Moderate    Diet Recommendation NPO;Ice chips PRN after oral care        Other   Recommendations Oral Care Recommendations: Oral care QID   Follow Up Recommendations  Other (comment) (TBD)    Frequency and Duration min 2x/week  2 weeks       SLP Swallow Goals Goal #3: Consume diagnostic PO trials of various consistencies with no outward s/s of aspiration/change in vital signs to determine PO readiness   Swallow Study Prior Functional Status   Lives at home with no history of dysphagia     General Date of Onset: 12/29/12 HPI: History, review of systems, and physical exam are limited due to patient's status and acuity. Type of Study: Bedside swallow evaluation Diet Prior to this Study: NPO Temperature Spikes Noted: No History of Recent Intubation: Yes Length of Intubations (days): 9 days Date extubated: 01/08/13 Behavior/Cognition: Cooperative;Lethargic;Pleasant mood Oral Cavity - Dentition: Adequate natural dentition Self-Feeding Abilities: Total assist Patient Positioning: Upright in bed Baseline Vocal Quality: Hoarse Volitional Cough: Strong Volitional Swallow: Able to elicit    Oral/Motor/Sensory Function Overall Oral Motor/Sensory Function: Appears within functional limits for tasks assessed   Ice Chips Ice chips: Impaired Pharyngeal Phase Impairments: Suspected delayed Swallow;Change in Vital Signs   Thin Liquid Thin Liquid: Impaired Pharyngeal  Phase Impairments: Suspected delayed Swallow;Throat Clearing - Delayed;Change in Vital Signs    Nectar Thick Nectar Thick Liquid: Not tested   Honey Thick Honey Thick Liquid: Not tested   Puree Puree: Not tested   Solid   GO    Solid: Not tested      Moreen Fowler MS, CCC-SLP [phone] [facility] 01/08/2013,4:57 PM

## 2013-01-08 NOTE — Progress Notes (Signed)
[facility] Rounding Note  Subjective: Filter clotted X 3 yesterday so CRRT discontinued Weight down another 3 kg Planning for extubation today Patient alert, oriented, following commands (Talked to patient with wife as interpreter)  O:BP 138/77  Pulse 106  Temp(Src) 98.4 F (36.9 C) (Oral)  Resp 23  Ht 6' (1.829 m)  Wt 116.8 kg (257 lb 8 oz)  BMI 34.92 kg/m2  SpO2 98%  Intake/Output Summary (Last 24 hours) at 01/08/13 1006 Last data filed at 01/08/13 0935  Gross per 24 hour  Intake 1307.97 ml  Output   2272 ml  Net -964.03 ml   Weight change: -2.7 kg (-5 lb 15.2 oz) Gen: intubated, awake and alert Right IJ catheter (7/26) intact ZOX:[id number] 80's Resp:clear anteriorly  Abd:+ BS obese, mild distension, softer, No obvious pain with palpation UJW:JXBJY edemaleft/ right leg splinted NEURO:awake and alert  Minimal amt dark muddy brown urine in foley Scrotal edema much better Ecchymotic left groin from prior  Impella device  WEIGHTS: 01/08/13 0500 116.8 kg  (CRRT discontinued) 01/07/13 0441 119.5 kg  01/06/13 0448 122.3 kg 01/05/13 0500 127.1 kg  01/04/13 0400 130.9 kg  01/03/13 0500 136.6 kg  (incr neg 150/hr w/CRRT) 01/02/13 0500 138.5 kg  (incr neg 100/hr w/CRRT) 01/01/13 0441 141 kg   12/31/12 0500 141.4 kg   12/30/12 0500 135.8 kg   (CRRT started neg 50/hour) 12/29/12 2300 133.3 kg   12/29/12 1708 121.564 kg (1st post rescuscitation weight)  MEDICATIONS  . antiseptic oral rinse  15 mL Mouth Rinse QID  . carvedilol  3.125 mg Per NG tube BID WC  . cefTRIAXone (ROCEPHIN)  IV  1 g Intravenous Q12H  . chlorhexidine  15 mL Mouth Rinse BID  . darbepoetin (ARANESP) injection - NON-DIALYSIS  150 mcg Subcutaneous Q Mon-1800  . heparin subcutaneous  5,000 Units Subcutaneous Q8H  . hydrALAZINE  25 mg Oral Q8H  . insulin aspart  1 Units Subcutaneous Q4H  . insulin aspart  1-3 Units Subcutaneous Q4H  . insulin glargine  10 Units Subcutaneous Q24H  .  isosorbide dinitrate  10 mg Per Tube TID  . pantoprazole (PROTONIX) IV  40 mg Intravenous Q24H  . sodium chloride  10-40 mL Intracatheter Q12H   . sodium chloride Stopped (01/01/13 1400)  . sodium chloride Stopped (12/31/12 2300)  . dextrose    . dialysis replacement fluid (prismasate) 500 mL/hr at 01/07/13 1424  . dialysis replacement fluid (prismasate) 300 mL/hr at 01/07/13 0419  . dialysate (PRISMASATE) 1,500 mL/hr at 01/07/13 1704     Recent Labs  01/07/13 0400 01/07/13 1600 01/08/13 0320  NA 135 135 133*  K 4.3 4.4 4.8  CL 97 96 96  CO2 22 24 20   GLUCOSE 115* 113* 120*  BUN 74* 74* 94*  CREATININE 3.61* 3.67* 4.82*  CALCIUM 8.7 8.5 8.3*  MG 3.0*  --  3.2*  PHOS 6.1* 7.0* 8.4*     Recent Labs  01/06/13 0500  01/07/13 1600 01/08/13 0320  AST 58*  --   --  61*  ALT 321*  --   --  159*  ALKPHOS 249*  --   --  180*  BILITOT 0.7  --   --  0.5  PROT 7.1  --   --  7.4  ALBUMIN 2.5*  < > 2.6* 2.5*  < > = values in this interval not displayed.    Recent Labs  01/07/13 0400 01/08/13 0320  WBC 21.2* 20.0*  HGB 10.0* 9.8*  HCT 31.1* 30.7*  MCV 96.9 97.2  PLT 155 213    Microbiology Results    Procedure Component Value Units Date/Time   Culture, bal-quantitative [[id number]] Collected: 01/04/13 1535   Order Status: Completed  Updated: 01/06/13 0800    Specimen Description BRONCHIAL ALVEOLAR LAVAGE     Special Requests BRONCH WASH LL LOBE      Gram Stain -     Result:     RARE WBC PRESENT, PREDOMINANTLY PMN NO SQUAMOUS EPITHELIAL CELLS SEEN NO ORGANISMS SEEN    Colony Count 30,000 COLONIES/ML     Culture -     Result:     STAPHYLOCOCCUS AUREUS - MSSA Note: RIFAMPIN AND GENTAMICIN SHOULD NOT BE USED AS SINGLE DRUGS FOR TREATMENT OF STAPH INFECTIONS.    Report Status PENDING      Dg Chest Port 1 View  01/08/2013   *RADIOLOGY REPORT*  Clinical Data: Follow up pneumonia  PORTABLE CHEST - 1 VIEW  Comparison: Prior chest x-ray 01/07/2013  Findings: The  endotracheal tube is 3.5 cm above the carina.  Left subclavian approach central venous catheter with the tip at the superior cavoatrial junction.  The right IJ approach temporary hemodialysis catheter is noted with the tip in the upper SVC. Visualized nasogastric tube in unchanged position.  Cardiac and mediastinal contours are unchanged.  Inspiratory volumes remain very low and there is left basilar atelectasis versus infiltrate. Small right pleural effusion is improving and there is improved aeration in the right base.  IMPRESSION:  1.  Stable and satisfactory support apparatus. 2.  Similar appearance of dense left basilar opacity which may represent a combination of pleural fluid with atelectasis and / or infiltrate. 3.  Improved aeration in the right base with decreasing pleural fluid and opacity.   Original Report Authenticated By: Malachy Moan, M.D.   Dg Chest Port 1 View  01/07/2013   *RADIOLOGY REPORT*  Clinical Data: Assess infiltrate  PORTABLE CHEST - 1 VIEW  Comparison: Prior radiograph from 01/06/2013  Findings: The the patient remains intubated with the tip of the endotracheal tube located 2.9 cm above the carina.  Enteric tube courses into the abdomen, and appears coiled within the gastric fundus, similar to prior. Right IJ central venous catheter is unchanged.  Left subclavian central venous catheter is in stable position with tip at cavoatrial junction.  Heart size is unchanged.  The lungs remain hypoinflated.  Bilateral layering pleural effusions persist, likely capping the right lung apex.  Associated bibasilar airspace opacities are also not significantly changed, which may read represent atelectasis or infiltrate.  No overt pulmonary edema.  No pneumothorax.  The bony structures are intact.  IMPRESSION: 1.  Support apparatus as above.  Tip of endotracheal tube 2.9 cm above the carina. 2.  Similar appearance of the chest with persistent hypoinflation with bilateral pleural effusions and  associated bibasilar airspace opacities, which may reflect atelectasis or infiltrate.   Original Report Authenticated By: Rise Mu, M.D.   Assessment/Recommendations  52 yo male SP Rt ankle surgery 7/20 who presented to ER with AMS thought to be due to narcotic excess,  coded 3 times in the ED,  taken emergently to cath lab and had Rt and Lt heart cath(nonobstructive ds/EF 20%) and placement of impella device (now removed). Scr 1.76 on adm with unknown baseline, with development of ischemic ATN requiring initiation of CRRT (7/25)   Acute (vs acute on chronic) Renal failure (baseline creatinine not known) sec to ischemic ATN from hypotension/cardiogenic shock +/-  IV contrast.   Pressors/inotropes off Impella device out.  No evidence at this time of any renal functional recovery  Volume status MUCH improved with CRRT CRRT off - will need temp cath replaced with tunnelled HD catheter I have consulted VVS Keep pt NPO p MN Begin intermittent HD until recovery  Anemia Continue  aranesp 150/week  (7/28) improved/stable around 10  Iron low not repleted with ferritin of nearly 7000 (Patient is Jehovah Witness)  Cardiac S/p arrest X3 No CAD by cath EF20% Impella out, no inotropes or pressors now Cards following  VDRF For extubation today  S/p right achilles reconstruction Per ortho  MSSA in BAL On rocephin (8/2)  Belma Dyches B

## 2013-01-08 NOTE — Progress Notes (Signed)
PULMONARY  / CRITICAL CARE MEDICINE  Name: Nicholas Potts MRN: [id number] DOB: Jul 23, 1960    ADMISSION DATE:  12/29/2012  REFERRING MD :  EDP- Dr. Dan Humphreys PRIMARY SERVICE: PCCM  CHIEF COMPLAINT:  Acute Overdose  BRIEF PATIENT DESCRIPTION: [name] who presented to [facility] ER on 7/24 with AMS related to oxycodone overdose.  Underwent R achilles tendon reconstruction 7/20 with RLE cast in place on admit.  COurse complicated by respiratory arrest initially, then full cardiac arrest. He had episodes of ventricular tachycardia and was defibrillated . He had CPR with return of spontaneous circulation. Emergent cath >> EF 20%, mild non obstructive CAD. Required placement of Impella device for cardiogenic shock  SIGNIFICANT EVENTS / STUDIES:  7/20 - R ankle surgery (? Type, no [facility] Records) ........................................................................................................ 7/24 - Admit to [facility] with ER with acute oxy IR OD 7/25 duplex neg DVT 7/26- impella out 7/28-Dobutamine 7/28 eeg- This EEG is abnormal with moderately severe generalized nonspecific slowing of cerebral activity, which can be seen with a wide variety of encephalopathic processes. No epileptic activity was recorded.  7/29 - vent settings improved to peep 5, neg 2.5 liters cvvhd 7/30 upper ext doppler US - no obvious evidence of DVT of R or L upper ext, appears to be superficial thrombosis in R basilic and antecubital communicating veins 7/30 bronch - no significant secretions, no major distal plugging, BAL RLL, clear, white 8/1- weaning well, excellent neurostatus, neg 3.7 liters  LINES / TUBES: L [place] TLC 7/24>>> OETT 7/24>>> Aline 7/24>>>729 Femoral swan ganz 7/24>>>7/27 Impella device 7/24>>>7/26  CULTURES: Sputum 7/25>>>non-pathogenic oropharyngeal-type flora insolated (rare WBC present, mostly PMN) BCx2 7/24>>>ng UC 7/24>>>ng C. Diff 7/29>>>neg Bronch bal 7/30>>>rare WBC, mostly PMN>>>Staph  aureus  ANTIBIOTICS: Vanco 7/24>>>>7/28 Zosyn 7/2/4>>>7/28 Ceftriaxone 7/28>>>7/31 Vanco 8/1>>>  SUBJECTIVE:   Tolerating pressure support.  Denies chest pain.  VITAL SIGNS: Temp:  [98.9 F (37.2 C)-100.2 F (37.9 C)] 100.2 F (37.9 C) (08/03 0300) Pulse Rate:  [82-106] 106 (08/03 0742) Resp:  [17-25] 21 (08/03 0742) BP: (95-153)/(54-74) 137/73 mmHg (08/03 0850) SpO2:  [91 %-100 %] 97 % (08/03 0742) FiO2 (%):  [40 %] 40 % (08/03 0742) Weight:  [257 lb 8 oz (116.8 kg)] 257 lb 8 oz (116.8 kg) (08/03 0500)  HEMODYNAMICS: CVP:  [3 mmHg-40 mmHg] 40 mmHg  VENTILATOR SETTINGS: Vent Mode:  [-] PSV;CPAP FiO2 (%):  [40 %] 40 % Set Rate:  [24 bmp] 24 bmp Vt Set:  [550 mL] 550 mL PEEP:  [5 cmH20] 5 cmH20 Pressure Support:  [10 cmH20] 10 cmH20 Plateau Pressure:  [18 cmH20-20 cmH20] 19 cmH20  INTAKE / OUTPUT: Intake/Output     08/02 0701 - 08/03 0700 08/03 0701 - 08/04 0700   I.V. (mL/kg) 417 (3.6)    NG/GT 620    IV Piggyback 300    Total Intake(mL/kg) 1337 (11.4)    Urine (mL/kg/hr) 15 (0)    Other 2402 (0.9)    Stool 475 (0.2)    Total Output 2892     Net -1555            PHYSICAL EXAMINATION: General: No distress Neuro:  Follows commands, moves extremities, RASS 0 HEENT: ETT in place Cardiovascular: regular Lungs: scattered rhonchi Abdomen: soft, non tender Musculoskeletal:  No edema, RLE with partial hard cast Skin: no rashes  LABS: CBC Recent Labs     01/06/13  0500  01/07/13  0400  01/08/13  0320  WBC  26.3*  21.2*  20.0*  HGB  10.0*  10.0*  9.8*  HCT  30.1*  31.1*  30.7*  PLT  109*  155  213   BMET Recent Labs     01/07/13  0400  01/07/13  1600  01/08/13  0320  NA  135  135  133*  K  4.3  4.4  4.8  CL  97  96  96  CO2  22  24  20   BUN  74*  74*  94*  CREATININE  3.61*  3.67*  4.82*  GLUCOSE  115*  113*  120*    Electrolytes Recent Labs     01/06/13  0500   01/07/13  0400  01/07/13  1600  01/08/13  0320  CALCIUM  8.9   < >  8.7   8.5  8.3*  MG  3.1*   --   3.0*   --   3.2*  PHOS  5.3*   < >  6.1*  7.0*  8.4*   < > = values in this interval not displayed.   ABG Recent Labs     01/06/13  0927  PHART  7.244*  PCO2ART  60.2*  PO2ART  77.0*    Liver Enzymes Recent Labs     01/06/13  0500   01/07/13  0400  01/07/13  1600  01/08/13  0320  AST  58*   --    --    --   61*  ALT  321*   --    --    --   159*  ALKPHOS  249*   --    --    --   180*  BILITOT  0.7   --    --    --   0.5  ALBUMIN  2.5*   < >  2.5*  2.6*  2.5*   < > = values in this interval not displayed.   Glucose Recent Labs     01/07/13  1236  01/07/13  1637  01/07/13  2011  01/07/13  2346  01/08/13  0307  01/08/13  0749  GLUCAP  113*  97  141*  118*  115*  146*    Imaging Dg Chest Port 1 View  01/08/2013   *RADIOLOGY REPORT*  Clinical Data: Follow up pneumonia  PORTABLE CHEST - 1 VIEW  Comparison: Prior chest x-ray 01/07/2013  Findings: The endotracheal tube is 3.5 cm above the carina.  Left subclavian approach central venous catheter with the tip at the superior cavoatrial junction.  The right IJ approach temporary hemodialysis catheter is noted with the tip in the upper SVC. Visualized nasogastric tube in unchanged position.  Cardiac and mediastinal contours are unchanged.  Inspiratory volumes remain very low and there is left basilar atelectasis versus infiltrate. Small right pleural effusion is improving and there is improved aeration in the right base.  IMPRESSION:  1.  Stable and satisfactory support apparatus. 2.  Similar appearance of dense left basilar opacity which may represent a combination of pleural fluid with atelectasis and / or infiltrate. 3.  Improved aeration in the right base with decreasing pleural fluid and opacity.   Original Report Authenticated By: Malachy Moan, M.D.   Dg Chest Port 1 View  01/07/2013   *RADIOLOGY REPORT*  Clinical Data: Assess infiltrate  PORTABLE CHEST - 1 VIEW  Comparison: Prior radiograph from  01/06/2013  Findings: The the patient remains intubated with the tip of the endotracheal tube located 2.9 cm above the carina.  Enteric tube courses into the abdomen, and appears coiled within the gastric fundus, similar to prior. Right IJ central venous catheter is unchanged.  Left subclavian central venous catheter is in stable position with tip at cavoatrial junction.  Heart size is unchanged.  The lungs remain hypoinflated.  Bilateral layering pleural effusions persist, likely capping the right lung apex.  Associated bibasilar airspace opacities are also not significantly changed, which may read represent atelectasis or infiltrate.  No overt pulmonary edema.  No pneumothorax.  The bony structures are intact.  IMPRESSION: 1.  Support apparatus as above.  Tip of endotracheal tube 2.9 cm above the carina. 2.  Similar appearance of the chest with persistent hypoinflation with bilateral pleural effusions and associated bibasilar airspace opacities, which may reflect atelectasis or infiltrate.   Original Report Authenticated By: Rise Mu, M.D.      ASSESSMENT / PLAN:  PULMONARY A: Acute respiratory failure in setting of aspiration pneumonia and acute pulmonary edema from accidental opiate overdose. P:   -pressure support wean as tolerated >> attempt extubation 8/03 -f/u CXR -will need CPAP/BiPAP after extubation  CARDIOVASCULAR A: Acute systolic CHF - EF 35%. Cardiogenic shock >> resolved. VT cardiac arrest >> resolved. HTN. Hyperlipidemia. P:  -continue coreg, hydralazine isosorbide -echo in 8 weeks from initial event -hold simvastatin until LFT's improved  RENAL A: Acute kidney injury 2nd to shock. Hyperkalemia >> resolved. Lactic acidosis >> resolved. P:   -continue CRRT >>plan to transition to intermittent HD 8/02 or 8/03  -negative fluid balance as tolerated  GASTROINTESTINAL A: Nutrition. Elevated LFT's 2nd to shock >> improved. P:   -Protonix for SUP -swallow  study after extubation -f/u LFT's intermittently  HEMATOLOGIC A: Anemia >> Jehovah's witness. Thrombocytopenia >> improved, HITT negative. P:  -Continue folate/MVI/thiamine -SQ heparin for DVT prevention  INFECTIOUS A: Aspiration pneumonia >> Staph aureus in BAL from 7/30 >> MSSA. P:  -continue rocephin   ENDOCRINE A: Hyperglycemia. P:   -SSI  NEUROLOGIC A: Acute encephalopathy 2nd to VT arrest, opiate overdose, renal failure >> much improved. P:   -prn fentanyl after extubation   Updated family at bedside.  Discussed with Dr. Ladona Ridgel.  CC time 35 minutes.  Coralyn Helling, MD [facility] Pulmonary/Critical Care 01/08/2013, 9:28 AM Pager:  [phone] After 3pm call: [phone]

## 2013-01-08 NOTE — Progress Notes (Signed)
Patient ID: Nicholas Potts, male   DOB: 18-May-1961, 52 y.o.   MRN: [id number]    Subjective:  Patient remains intubated, responds to commands, note trouble with filter last night suspending CRRT. No complaints  Objective:  Vital Signs in the last 24 hours: Temp:  [98.5 F (36.9 C)-100.2 F (37.9 C)] 100.2 F (37.9 C) (08/03 0300) Pulse Rate:  [82-106] 106 (08/03 0742) Resp:  [17-26] 21 (08/03 0742) BP: (95-153)/(48-74) 119/65 mmHg (08/03 0742) SpO2:  [91 %-100 %] 97 % (08/03 0742) FiO2 (%):  [40 %] 40 % (08/03 0742) Weight:  [257 lb 8 oz (116.8 kg)] 257 lb 8 oz (116.8 kg) (08/03 0500)  Intake/Output from previous day: 08/02 0701 - 08/03 0700 In: 1337 [I.V.:417; NG/GT:620; IV Piggyback:300] Out: 2892 [Urine:15; Stool:475] Intake/Output from this shift:    . antiseptic oral rinse  15 mL Mouth Rinse QID  . carvedilol  3.125 mg Per NG tube BID WC  . cefTRIAXone (ROCEPHIN)  IV  1 g Intravenous Q12H  . chlorhexidine  15 mL Mouth Rinse BID  . darbepoetin (ARANESP) injection - NON-DIALYSIS  150 mcg Subcutaneous Q Mon-1800  . feeding supplement  60 mL Oral 5 X Daily  . heparin subcutaneous  5,000 Units Subcutaneous Q8H  . hydrALAZINE  25 mg Oral Q8H  . insulin aspart  1 Units Subcutaneous Q4H  . insulin aspart  1-3 Units Subcutaneous Q4H  . insulin glargine  10 Units Subcutaneous Q24H  . isosorbide dinitrate  10 mg Per Tube TID  . pantoprazole sodium  40 mg Per Tube Q1200  . sodium chloride  10-40 mL Intracatheter Q12H   . sodium chloride Stopped (01/01/13 1400)  . sodium chloride Stopped (12/31/12 2300)  . dextrose    . feeding supplement (PROMOTE) 1,000 mL (01/07/13 1411)  . fentaNYL infusion INTRAVENOUS 75 mcg/hr (01/07/13 2052)  . dialysis replacement fluid (prismasate) 500 mL/hr at 01/07/13 1424  . dialysis replacement fluid (prismasate) 300 mL/hr at 01/07/13 0419  . dialysate (PRISMASATE) 1,500 mL/hr at 01/07/13 1704  . propofol 15 mcg/kg/min (01/08/13 7829)     Physical Exam: The patient is intubated. Anasarca has resolved  Neck with dialysis and central venous catheter  Chest CTA anteriorly  Heart sounds are very distant. No murmur gallop or rub.  Abd mildly distended, no masses Scrotal edema improving  Extremities reveal trace edema  Neurologic exam - patient can move all extremities and appears to understand conversation despite limited english  Lab Results:  Recent Labs  01/07/13 0400 01/08/13 0320  WBC 21.2* 20.0*  HGB 10.0* 9.8*  PLT 155 213    Recent Labs  01/07/13 1600 01/08/13 0320  NA 135 133*  K 4.4 4.8  CL 96 96  CO2 24 20  GLUCOSE 113* 120*  BUN 74* 94*  CREATININE 3.67* 4.82*     Recent Labs  01/08/13 0320  PROT 7.4  ALBUMIN 2.5*  AST 61*  ALT 159*  ALKPHOS 180*  BILITOT 0.5  BILIDIR 0.3  IBILI 0.2*         Assessment/Plan:  1. Cardiogenic shock- improved; nonobstructive CAD by cath. Severe LV dsyfunction. Continue hydralazine/nitrates; no ACEI for now given acute renal insuff. Continue coreg 3.125 mg BID. Increase meds as tolerated by pulse and BP; repeat echo in 8 weeks to see if LV function has improved. 2. S/P respiratory and cardiac arrest-felt to most likely be respiratory arrest related to sedatives and OSA followed by aspiration. We were not able to completely rule  out pulmonary embolus. Lower ext dopplers neg. Heparin DCed given acute blood loss anemia and inability to transfuse (Jehovah's witness) as well as thrombocytopenia.  3. Acute renal failure-most likely ATN from cardiac arrest/cardiogenic shock; also probable contribution from contrast,  patient remains volume overloaded but improving with CRRT; little urine output; nephrology managing. His weight is down over 30 lbs. 4. VDRF-management per CCM, weaning trials ongoing 5. Acute blood loss anemia-patient is Jehovah's witness. 6. Thrombocytopenia-Plts improving; HIT pending. If neg, would resume DVT prophylaxis heparin 7.  Pneumonia-continue antibiotics, switching to Cephalosporin from vanco.            LOS: 10 days    Erna Brossard,M.D. 01/08/2013, 7:53 AM

## 2013-01-08 NOTE — Progress Notes (Signed)
FENTANYL 150 CC WASTED IN SINK BY S.PILAND RN, WITNESSED Pricilla Handler, RN

## 2013-01-08 NOTE — Consult Note (Signed)
I was asked to see the patient for hemodialysis access. He is alert and oriented currently. He speaks very little English but his wife and son are fluent and her here to help translate. He suffered a rest related to narcotic overdose following ankle surgery. He has a multisystem failure to include renal failure. He was undergoing dialysis via a temporary catheter in his right IJ. This quit functioning yesterday and we've been asked to place a permanent dialysis catheter tomorrow. He is right-handed.  Past Medical History  Diagnosis Date  . Hyperlipidemia   . OSA (obstructive sleep apnea)   . CVA (cerebral infarction)     History  Substance Use Topics  . Smoking status: Former Smoker  . Smokeless tobacco: Never Used  . Alcohol Use: Yes     Comment: History of heavy use but only social at present    No family history on file.  No Known Allergies  Current facility-administered medications:0.9 %  sodium chloride infusion, 250 mL, Intravenous, PRN, Brandi L Ollis, NP, 250 mL at 01/01/13 2000;  0.9 %  sodium chloride infusion, , Intravenous, Continuous, Wesam G Yacoub, MD;  0.9 %  sodium chloride infusion, , Intravenous, Continuous, Rakesh V Alva, MD;  antiseptic oral rinse (BIOTENE) solution 15 mL, 15 mL, Mouth Rinse, QID, Wesam G Yacoub, MD, 15 mL at 01/08/13 0308 carvedilol (COREG) tablet 3.125 mg, 3.125 mg, Per NG tube, BID WC, Brian S Crenshaw, MD, 3.125 mg at 01/08/13 0850;  cefTRIAXone (ROCEPHIN) 1 g in dextrose 5 % 50 mL IVPB, 1 g, Intravenous, Q12H, Wesam G Yacoub, MD, 1 g at 01/08/13 1000;  [START ON 01/09/2013] cefTRIAXone (ROCEPHIN) 2 g in dextrose 5 % 50 mL IVPB, 2 g, Intravenous, Q24H, Wesam G Yacoub, MD chlorhexidine (PERIDEX) 0.12 % solution 15 mL, 15 mL, Mouth Rinse, BID, Wesam G Yacoub, MD, 15 mL at 01/08/13 0951;  darbepoetin (ARANESP) injection 150 mcg, 150 mcg, Subcutaneous, Q Mon-1800, Cynthia B Dunham, MD, 150 mcg at 01/02/13 1813;  dextrose 10 % infusion, , Intravenous,  Continuous PRN, Wesam G Yacoub, MD;  fentaNYL (SUBLIMAZE) injection 25-100 mcg, 25-100 mcg, Intravenous, Q2H PRN, Vineet Sood, MD heparin injection 5,000 Units, 5,000 Units, Subcutaneous, Q8H, Daniel J Feinstein, MD, 5,000 Units at 01/08/13 0642;  hydrALAZINE (APRESOLINE) injection 10 mg, 10 mg, Intravenous, Q4H PRN, Vineet Sood, MD;  hydrALAZINE (APRESOLINE) tablet 25 mg, 25 mg, Oral, Q8H, Brian S Crenshaw, MD, 25 mg at 01/08/13 0642;  insulin aspart (novoLOG) injection 1 Units, 1 Units, Subcutaneous, Q4H, Wesam G Yacoub, MD, 1 Units at 01/08/13 0754 insulin aspart (novoLOG) injection 1-3 Units, 1-3 Units, Subcutaneous, Q4H, Wesam G Yacoub, MD, 1 Units at 01/08/13 0754;  insulin glargine (LANTUS) injection 10 Units, 10 Units, Subcutaneous, Q24H, Wesam G Yacoub, MD, 10 Units at 01/07/13 1707;  isosorbide dinitrate (ISORDIL) tablet 10 mg, 10 mg, Per Tube, TID, Wesam G Yacoub, MD, 10 mg at 01/08/13 1002;  metoprolol (LOPRESSOR) injection 5 mg, 5 mg, Intravenous, Q4H PRN, Vineet Sood, MD ondansetron (ZOFRAN) injection 4 mg, 4 mg, Intravenous, Q6H PRN, Michael Cooper, MD;  pantoprazole (PROTONIX) injection 40 mg, 40 mg, Intravenous, Q24H, Vineet Sood, MD;  sodium chloride 0.9 % injection 10-40 mL, 10-40 mL, Intracatheter, Q12H, Wesam G Yacoub, MD, 10 mL at 01/07/13 2230;  sodium chloride 0.9 % injection 10-40 mL, 10-40 mL, Intracatheter, PRN, Wesam G Yacoub, MD  BP 147/74  Pulse 106  Temp(Src) 98.4 F (36.9 C) (Oral)  Resp 23  Ht 6' (1.829 m)  Wt 257   lb 8 oz (116.8 kg)  BMI 34.92 kg/m2  SpO2 98%  Body mass index is 34.92 kg/(m^2).       Review of systems reviewed in his history and physical with nothing to  add  Physical exam alert gentleman in no acute distress 2+ radial pulses bilaterally with the operable cephalic veins bilaterally Respirations equal and nonlabored  He does have a left subclavian temporary catheter and a right IJ temporary catheter in place Skin without ulcers or  rashes Extremities with dressing in place over his ankle surgery on the right lower extremity Neurologically he is grossly intact  Impression and plan old acute renal failure secondary to arrest. I discussed options for hemodialysis with the patient and his family present. We will place a tunneled dialysis catheter tomorrow in the operating room. Surgery will be approximately 8:30 AM. Dr. Dickson will do the procedure. I explained the potential risks to include bleeding and infection and slight risk of pneumothorax. Patient understands and wishes to proceed  

## 2013-01-09 ENCOUNTER — Inpatient Hospital Stay (HOSPITAL_COMMUNITY): Payer: BC Managed Care – PPO | Admitting: Certified Registered"

## 2013-01-09 ENCOUNTER — Encounter (HOSPITAL_COMMUNITY)
Admission: EM | Disposition: A | Discharge status: Skilled Nursing Facility | Payer: Self-pay | Source: Home / Self Care | Attending: Pulmonary Disease

## 2013-01-09 ENCOUNTER — Inpatient Hospital Stay (HOSPITAL_COMMUNITY): Payer: BC Managed Care – PPO

## 2013-01-09 ENCOUNTER — Encounter (HOSPITAL_COMMUNITY): Payer: Self-pay | Admitting: Certified Registered"

## 2013-01-09 HISTORY — PX: INSERTION OF DIALYSIS CATHETER: SHX1324

## 2013-01-09 LAB — GLUCOSE, CAPILLARY
Glucose-Capillary: 96 mg/dL (ref 70–99)
Glucose-Capillary: 103 mg/dL — ABNORMAL HIGH (ref 70–99)

## 2013-01-09 LAB — CBC WITH DIFFERENTIAL/PLATELET
Basophils Absolute: 0.1 10*3/uL (ref 0.0–0.1)
Eosinophils Relative: 3 % (ref 0–5)
Lymphocytes Relative: 12 % (ref 12–46)
MCV: 94.8 fL (ref 78.0–100.0)
Platelets: 279 10*3/uL (ref 150–400)
RDW: 14.3 % (ref 11.5–15.5)
WBC: 19.7 10*3/uL — ABNORMAL HIGH (ref 4.0–10.5)

## 2013-01-09 LAB — RENAL FUNCTION PANEL
CO2: 18 mEq/L — ABNORMAL LOW (ref 19–32)
Chloride: 95 mEq/L — ABNORMAL LOW (ref 96–112)
GFR calc Af Amer: 7 mL/min — ABNORMAL LOW (ref >90)
Glucose, Bld: 101 mg/dL — ABNORMAL HIGH (ref 70–99)
Phosphorus: 13.2 mg/dL — ABNORMAL HIGH (ref 2.3–4.6)
Potassium: 5.4 mEq/L — ABNORMAL HIGH (ref 3.5–5.1)
Sodium: 136 mEq/L (ref 135–145)

## 2013-01-09 LAB — HEPATITIS B SURFACE ANTIBODY,QUALITATIVE: Hep B S Ab: POSITIVE — AB

## 2013-01-09 SURGERY — INSERTION OF DIALYSIS CATHETER
Anesthesia: Monitor Anesthesia Care | Site: Chest | Laterality: Right | Wound class: Clean

## 2013-01-09 MED ORDER — SODIUM CHLORIDE 0.9 % IV SOLN: 100.0000 mL | INTRAVENOUS | Status: DC | PRN

## 2013-01-09 MED ORDER — ALTEPLASE 2 MG IJ SOLR
4.0000 mg | Freq: Once | INTRAMUSCULAR | Status: AC
  Administered 2013-01-09: 4 mg
  Filled 2013-01-09 (×2): qty 4

## 2013-01-09 MED ORDER — PENTAFLUOROPROP-TETRAFLUOROETH EX AERO: 1.0000 "application " | INHALATION_SPRAY | CUTANEOUS | Status: DC | PRN

## 2013-01-09 MED ORDER — NYSTATIN-TRIAMCINOLONE 100000-0.1 UNIT/GM-% EX CREA
TOPICAL_CREAM | Freq: Three times a day (TID) | CUTANEOUS | Status: DC
  Administered 2013-01-09 – 2013-01-15 (×18): via TOPICAL
  Administered 2013-01-16: 1 via TOPICAL
  Administered 2013-01-16 – 2013-01-18 (×6): via TOPICAL
  Filled 2013-01-09 (×3): qty 15

## 2013-01-09 MED ORDER — HYDROMORPHONE HCL PF 1 MG/ML IJ SOLN: 0.2500 mg | INTRAMUSCULAR | Status: DC | PRN

## 2013-01-09 MED ORDER — SIMVASTATIN 20 MG PO TABS
20.0000 mg | ORAL_TABLET | Freq: Every day | ORAL | Status: DC
  Administered 2013-01-09 – 2013-01-27 (×19): 20 mg via ORAL
  Filled 2013-01-09 (×19): qty 1

## 2013-01-09 MED ORDER — NEPRO/CARBSTEADY PO LIQD
237.0000 mL | ORAL | Status: DC | PRN
  Filled 2013-01-09: qty 237

## 2013-01-09 MED ORDER — LIDOCAINE-PRILOCAINE 2.5-2.5 % EX CREA
1.0000 "application " | TOPICAL_CREAM | CUTANEOUS | Status: DC | PRN
  Filled 2013-01-09: qty 5

## 2013-01-09 MED ORDER — NYSTATIN 100000 UNIT/GM EX POWD
Freq: Three times a day (TID) | CUTANEOUS | Status: DC
  Administered 2013-01-09 – 2013-01-18 (×26): via TOPICAL
  Filled 2013-01-09 (×2): qty 15

## 2013-01-09 MED ORDER — HEPARIN SODIUM (PORCINE) 1000 UNIT/ML IJ SOLN
INTRAMUSCULAR | Status: DC | PRN
  Administered 2013-01-09: 4.6 mL

## 2013-01-09 MED ORDER — OXYCODONE HCL 5 MG PO TABS
10.0000 mg | ORAL_TABLET | ORAL | Status: DC | PRN
Start: 2013-01-09 — End: 2013-01-20
  Administered 2013-01-17: 10 mg via ORAL

## 2013-01-09 MED ORDER — LIDOCAINE HCL (PF) 1 % IJ SOLN: 5.0000 mL | INTRAMUSCULAR | Status: DC | PRN

## 2013-01-09 MED ORDER — ASPIRIN 325 MG PO TABS
325.0000 mg | ORAL_TABLET | Freq: Every day | ORAL | Status: DC
  Administered 2013-01-09 – 2013-01-27 (×19): 325 mg via ORAL
  Filled 2013-01-09 (×19): qty 1

## 2013-01-09 MED ORDER — 0.9 % SODIUM CHLORIDE (POUR BTL) OPTIME
TOPICAL | Status: DC | PRN
  Administered 2013-01-09: 1000 mL

## 2013-01-09 MED ORDER — OXYCODONE HCL 5 MG PO TABS: 5.0000 mg | ORAL_TABLET | Freq: Once | ORAL | Status: DC | PRN

## 2013-01-09 MED ORDER — MIDAZOLAM HCL 5 MG/5ML IJ SOLN
INTRAMUSCULAR | Status: DC | PRN
  Administered 2013-01-09: 2 mg via INTRAVENOUS

## 2013-01-09 MED ORDER — LIDOCAINE-EPINEPHRINE (PF) 1 %-1:200000 IJ SOLN
INTRAMUSCULAR | Status: AC
  Filled 2013-01-09: qty 10

## 2013-01-09 MED ORDER — OXYCODONE HCL 5 MG/5ML PO SOLN: 5.0000 mg | Freq: Once | ORAL | Status: DC | PRN

## 2013-01-09 MED ORDER — ALTEPLASE 2 MG IJ SOLR
2.0000 mg | Freq: Once | INTRAMUSCULAR | Status: DC
  Filled 2013-01-09: qty 2

## 2013-01-09 MED ORDER — ONE-A-DAY 50 PLUS PO TABS: 1.0000 | ORAL_TABLET | Freq: Every day | ORAL | Status: DC

## 2013-01-09 MED ORDER — DARBEPOETIN ALFA-POLYSORBATE 150 MCG/0.3ML IJ SOLN
INTRAMUSCULAR | Status: AC
  Administered 2013-01-09: 200 ug
  Filled 2013-01-09: qty 0.3

## 2013-01-09 MED ORDER — CARVEDILOL 6.25 MG PO TABS
6.2500 mg | ORAL_TABLET | Freq: Two times a day (BID) | ORAL | Status: DC
  Administered 2013-01-10 – 2013-01-27 (×32): 6.25 mg via NASOGASTRIC
  Filled 2013-01-09 (×38): qty 1

## 2013-01-09 MED ORDER — OXYCODONE HCL 5 MG PO CAPS: 10.0000 mg | ORAL_CAPSULE | ORAL | Status: DC | PRN

## 2013-01-09 MED ORDER — SODIUM CHLORIDE 0.9 % IR SOLN
Status: DC | PRN
  Administered 2013-01-09: 08:00:00

## 2013-01-09 MED ORDER — ADULT MULTIVITAMIN W/MINERALS CH
1.0000 | ORAL_TABLET | Freq: Every day | ORAL | Status: DC
  Administered 2013-01-10 – 2013-01-27 (×18): 1 via ORAL
  Filled 2013-01-09 (×18): qty 1

## 2013-01-09 MED ORDER — CALCIUM ACETATE 667 MG PO CAPS
1334.0000 mg | ORAL_CAPSULE | Freq: Three times a day (TID) | ORAL | Status: DC
  Administered 2013-01-10 – 2013-01-27 (×49): 1334 mg via ORAL
  Filled 2013-01-09 (×57): qty 2

## 2013-01-09 MED ORDER — ESMOLOL HCL 10 MG/ML IV SOLN
INTRAVENOUS | Status: DC | PRN
  Administered 2013-01-09: 20 mg via INTRAVENOUS

## 2013-01-09 MED ORDER — HEPARIN SODIUM (PORCINE) 1000 UNIT/ML DIALYSIS
1000.0000 [IU] | INTRAMUSCULAR | Status: DC | PRN
  Filled 2013-01-09: qty 1

## 2013-01-09 MED ORDER — MELOXICAM 15 MG PO TABS: 15.0000 mg | ORAL_TABLET | Freq: Every day | ORAL | Status: DC

## 2013-01-09 MED ORDER — FENTANYL CITRATE 0.05 MG/ML IJ SOLN
INTRAMUSCULAR | Status: DC | PRN
  Administered 2013-01-09: 50 ug via INTRAVENOUS
  Administered 2013-01-09 (×2): 25 ug via INTRAVENOUS
  Administered 2013-01-09 (×2): 50 ug via INTRAVENOUS

## 2013-01-09 MED ORDER — HEPARIN SODIUM (PORCINE) 1000 UNIT/ML IJ SOLN
INTRAMUSCULAR | Status: AC
  Filled 2013-01-09: qty 1

## 2013-01-09 MED ORDER — LIDOCAINE-EPINEPHRINE (PF) 1 %-1:200000 IJ SOLN
INTRAMUSCULAR | Status: DC | PRN
  Administered 2013-01-09: 10 mL via INTRADERMAL

## 2013-01-09 MED ORDER — ALTEPLASE 2 MG IJ SOLR
2.0000 mg | Freq: Once | INTRAMUSCULAR | Status: AC | PRN
  Filled 2013-01-09: qty 2

## 2013-01-09 MED ORDER — TRAMADOL HCL 50 MG PO TABS
50.0000 mg | ORAL_TABLET | Freq: Every day | ORAL | Status: DC | PRN
  Administered 2013-01-17: 50 mg via ORAL
  Filled 2013-01-09: qty 1

## 2013-01-09 SURGICAL SUPPLY — 40 items
BAG BANDED W/RUBBER/TAPE 36X54 (MISCELLANEOUS) ×2 IMPLANT
BAG DECANTER FOR FLEXI CONT (MISCELLANEOUS) ×2 IMPLANT
CATH CANNON HEMO 15F 50CM (CATHETERS) IMPLANT
CATH CANNON HEMO 15FR 19 (HEMODIALYSIS SUPPLIES) IMPLANT
CATH CANNON HEMO 15FR 23CM (HEMODIALYSIS SUPPLIES) ×2 IMPLANT
CATH CANNON HEMO 15FR 31CM (HEMODIALYSIS SUPPLIES) IMPLANT
CATH CANNON HEMO 15FR 32CM (HEMODIALYSIS SUPPLIES) IMPLANT
CHLORAPREP W/TINT 26ML (MISCELLANEOUS) ×2 IMPLANT
CLOTH BEACON ORANGE TIMEOUT ST (SAFETY) ×2 IMPLANT
COVER DOME SNAP 22 D (MISCELLANEOUS) ×2 IMPLANT
COVER PROBE W GEL 5X96 (DRAPES) IMPLANT
COVER SURGICAL LIGHT HANDLE (MISCELLANEOUS) ×2 IMPLANT
DRAPE C-ARM 42X72 X-RAY (DRAPES) ×2 IMPLANT
DRAPE CHEST BREAST 15X10 FENES (DRAPES) ×2 IMPLANT
GAUZE SPONGE 2X2 8PLY STRL LF (GAUZE/BANDAGES/DRESSINGS) ×1 IMPLANT
GAUZE SPONGE 4X4 16PLY XRAY LF (GAUZE/BANDAGES/DRESSINGS) ×2 IMPLANT
GLOVE BIO SURGEON STRL SZ7.5 (GLOVE) ×2 IMPLANT
GLOVE BIOGEL PI IND STRL 8 (GLOVE) ×1 IMPLANT
GLOVE BIOGEL PI INDICATOR 8 (GLOVE) ×1
GOWN STRL NON-REIN LRG LVL3 (GOWN DISPOSABLE) ×4 IMPLANT
KIT BASIN OR (CUSTOM PROCEDURE TRAY) ×2 IMPLANT
KIT ROOM TURNOVER OR (KITS) ×2 IMPLANT
NEEDLE 18GX1X1/2 (RX/OR ONLY) (NEEDLE) ×2 IMPLANT
NEEDLE 22X1 1/2 (OR ONLY) (NEEDLE) ×2 IMPLANT
NEEDLE HYPO 25GX1X1/2 BEV (NEEDLE) ×2 IMPLANT
NS IRRIG 1000ML POUR BTL (IV SOLUTION) ×2 IMPLANT
PACK SURGICAL SETUP 50X90 (CUSTOM PROCEDURE TRAY) ×2 IMPLANT
PAD ARMBOARD 7.5X6 YLW CONV (MISCELLANEOUS) ×4 IMPLANT
SPONGE GAUZE 2X2 STER 10/PKG (GAUZE/BANDAGES/DRESSINGS) ×1
SUT ETHILON 3 0 PS 1 (SUTURE) ×2 IMPLANT
SUT VICRYL 4-0 PS2 18IN ABS (SUTURE) ×2 IMPLANT
SYR 20CC LL (SYRINGE) ×4 IMPLANT
SYR 30ML LL (SYRINGE) IMPLANT
SYR 5ML LL (SYRINGE) ×4 IMPLANT
SYR CONTROL 10ML LL (SYRINGE) ×2 IMPLANT
SYRINGE 10CC LL (SYRINGE) ×2 IMPLANT
TAPE CLOTH SURG 4X10 WHT LF (GAUZE/BANDAGES/DRESSINGS) ×2 IMPLANT
TOWEL OR 17X24 6PK STRL BLUE (TOWEL DISPOSABLE) ×2 IMPLANT
TOWEL OR 17X26 10 PK STRL BLUE (TOWEL DISPOSABLE) ×2 IMPLANT
WATER STERILE IRR 1000ML POUR (IV SOLUTION) ×2 IMPLANT

## 2013-01-09 NOTE — Preoperative (Deleted)
Beta Blockers   Reason not to administer Beta Blockers:Not Applicable 

## 2013-01-09 NOTE — H&P (View-Only) (Signed)
I was asked to see the patient for hemodialysis access. He is alert and oriented currently. He speaks very little [place] but his wife and son are fluent and her here to help translate. He suffered a rest related to narcotic overdose following ankle surgery. He has a multisystem failure to include renal failure. He was undergoing dialysis via a temporary catheter in his right IJ. This quit functioning yesterday and we've been asked to place a permanent dialysis catheter tomorrow. He is right-handed.  Past Medical History  Diagnosis Date  . Hyperlipidemia   . OSA (obstructive sleep apnea)   . CVA (cerebral infarction)     History  Substance Use Topics  . Smoking status: Former Games developer  . Smokeless tobacco: Never Used  . Alcohol Use: Yes     Comment: History of heavy use but only social at present    No family history on file.  No Known Allergies  Current facility-administered medications:0.9 %  sodium chloride infusion, 250 mL, Intravenous, PRN, Jeanella Craze, NP, 250 mL at 01/01/13 2000;  0.9 %  sodium chloride infusion, , Intravenous, Continuous, Alyson Reedy, MD;  0.9 %  sodium chloride infusion, , Intravenous, Continuous, Oretha Milch, MD;  antiseptic oral rinse (BIOTENE) solution 15 mL, 15 mL, Mouth Rinse, QID, Alyson Reedy, MD, 15 mL at 01/08/13 0308 carvedilol (COREG) tablet 3.125 mg, 3.125 mg, Per NG tube, BID WC, Lewayne Bunting, MD, 3.125 mg at 01/08/13 0850;  cefTRIAXone (ROCEPHIN) 1 g in dextrose 5 % 50 mL IVPB, 1 g, Intravenous, Q12H, Alyson Reedy, MD, 1 g at 01/08/13 1000;  [START ON 01/09/2013] cefTRIAXone (ROCEPHIN) 2 g in dextrose 5 % 50 mL IVPB, 2 g, Intravenous, Q24H, Alyson Reedy, MD chlorhexidine (PERIDEX) 0.12 % solution 15 mL, 15 mL, Mouth Rinse, BID, Alyson Reedy, MD, 15 mL at 01/08/13 0951;  darbepoetin (ARANESP) injection 150 mcg, 150 mcg, Subcutaneous, Q Mon-1800, Sadie Haber, MD, 150 mcg at 01/02/13 1813;  dextrose 10 % infusion, , Intravenous,  Continuous PRN, Alyson Reedy, MD;  fentaNYL (SUBLIMAZE) injection 25-100 mcg, 25-100 mcg, Intravenous, Q2H PRN, Coralyn Helling, MD heparin injection 5,000 Units, 5,000 Units, Subcutaneous, Q8H, Nelda Bucks, MD, 5,000 Units at 01/08/13 [phone];  hydrALAZINE (APRESOLINE) injection 10 mg, 10 mg, Intravenous, Q4H PRN, Coralyn Helling, MD;  hydrALAZINE (APRESOLINE) tablet 25 mg, 25 mg, Oral, Q8H, Lewayne Bunting, MD, 25 mg at 01/08/13 [phone];  insulin aspart (novoLOG) injection 1 Units, 1 Units, Subcutaneous, Q4H, Alyson Reedy, MD, 1 Units at 01/08/13 0754 insulin aspart (novoLOG) injection 1-3 Units, 1-3 Units, Subcutaneous, Q4H, Alyson Reedy, MD, 1 Units at 01/08/13 0754;  insulin glargine (LANTUS) injection 10 Units, 10 Units, Subcutaneous, Q24H, Alyson Reedy, MD, 10 Units at 01/07/13 1707;  isosorbide dinitrate (ISORDIL) tablet 10 mg, 10 mg, Per Tube, TID, Alyson Reedy, MD, 10 mg at 01/08/13 1002;  metoprolol (LOPRESSOR) injection 5 mg, 5 mg, Intravenous, Q4H PRN, Coralyn Helling, MD ondansetron (ZOFRAN) injection 4 mg, 4 mg, Intravenous, Q6H PRN, Tonny Bollman, MD;  pantoprazole (PROTONIX) injection 40 mg, 40 mg, Intravenous, Q24H, Vineet Sood, MD;  sodium chloride 0.9 % injection 10-40 mL, 10-40 mL, Intracatheter, Q12H, Alyson Reedy, MD, 10 mL at 01/07/13 2230;  sodium chloride 0.9 % injection 10-40 mL, 10-40 mL, Intracatheter, PRN, Alyson Reedy, MD  BP 147/74  Pulse 106  Temp(Src) 98.4 F (36.9 C) (Oral)  Resp 23  Ht 6' (1.829 m)  Wt 257  lb 8 oz (116.8 kg)  BMI 34.92 kg/m2  SpO2 98%  Body mass index is 34.92 kg/(m^2).       Review of systems reviewed in his history and physical with nothing to  add  Physical exam alert gentleman in no acute distress 2+ radial pulses bilaterally with the operable cephalic veins bilaterally Respirations equal and nonlabored  He does have a left subclavian temporary catheter and a right IJ temporary catheter in place Skin without ulcers or  rashes Extremities with dressing in place over his ankle surgery on the right lower extremity Neurologically he is grossly intact  Impression and plan old acute renal failure secondary to arrest. I discussed options for hemodialysis with the patient and his family present. We will place a tunneled dialysis catheter tomorrow in the operating room. Surgery will be approximately 8:30 AM. Dr. Edilia Bo will do the procedure. I explained the potential risks to include bleeding and infection and slight risk of pneumothorax. Patient understands and wishes to proceed

## 2013-01-09 NOTE — Preoperative (Signed)
Beta Blockers   Reason not to administer Beta Blockers:patient has not received beta blocker therapy on nursing unit in 24 hours. will administer iv beta blocker during surgery.

## 2013-01-09 NOTE — Progress Notes (Addendum)
PULMONARY  / CRITICAL CARE MEDICINE  Name: CASHEL BELLINA MRN: [id number] DOB: May 17, 1961    ADMISSION DATE:  12/29/2012  REFERRING MD :  EDP- Dr. Dan Humphreys PRIMARY SERVICE: PCCM  CHIEF COMPLAINT:  Acute Overdose  BRIEF PATIENT DESCRIPTION: [name] who presented to [facility] ER on 7/24 with AMS related to oxycodone overdose.  Underwent R achilles tendon reconstruction 7/20 with RLE cast in place on admit.  COurse complicated by respiratory arrest initially, then full cardiac arrest. He had episodes of ventricular tachycardia and was defibrillated . He had CPR with return of spontaneous circulation. Emergent cath >> EF 20%, mild non obstructive CAD. Required placement of Impella device for cardiogenic shock  SIGNIFICANT EVENTS / STUDIES:  7/20 - R ankle surgery (? Type, no MC Records) ........................................................................................................ 7/24 - Admit to [facility] with ER with acute oxy IR OD 7/25 duplex neg DVT 7/26- impella out 7/28-Dobutamine 7/28 eeg- This EEG is abnormal with moderately severe generalized nonspecific slowing of cerebral activity, which can be seen with a wide variety of encephalopathic processes. No epileptic activity was recorded.  7/29 - vent settings improved to peep 5, neg 2.5 liters cvvhd 7/30 upper ext doppler US - no obvious evidence of DVT of R or L upper ext, appears to be superficial thrombosis in R basilic and antecubital communicating veins 7/30 bronch - no significant secretions, no major distal plugging, BAL RLL, clear, white 8/1- weaning well, excellent neurostatus, neg 3.7 liters  LINES / TUBES: L [place] TLC 7/24>>> OETT 7/24>>> Al[place] 7/24>>>729 Femoral swan ganz 7/24>>>7/27 Impella device 7/24>>>7/26  CULTURES: Sputum 7/25>>>non-pathogenic oropharyngeal-type flora insolated (rare WBC present, mostly PMN) BCx2 7/24>>>ng UC 7/24>>>ng C. Diff 7/29>>>neg Bronch bal 7/30>>>rare WBC, mostly PMN>>>Staph  aureus  ANTIBIOTICS: Vanco 7/24>>>>7/28 Zosyn 7/2/4>>>7/28 Ceftriaxone 7/28>>>7/31 Vanco 8/1>>>  SUBJECTIVE:   Tolerating pressure support.  Denies chest pain.  VITAL SIGNS: Temp:  [97.3 F (36.3 C)-98.7 F (37.1 C)] 97.3 F (36.3 C) (08/04 0915) Pulse Rate:  [89-94] 90 (08/04 0930) Resp:  [16-30] 16 (08/04 0930) BP: (113-149)/(58-86) 124/69 mmHg (08/04 0929) SpO2:  [93 %-99 %] 94 % (08/04 0930) Weight:  [116.1 kg (255 lb 15.3 oz)] 116.1 kg (255 lb 15.3 oz) (08/04 0500)  HEMODYNAMICS: CVP:  [4 mmHg] 4 mmHg  VENTILATOR SETTINGS:    INTAKE / OUTPUT: Intake/Output     08/03 0701 - 08/04 0700 08/04 0701 - 08/05 0700   I.V. (mL/kg) 84.8 (0.7)    NG/GT 91.7    IV Piggyback 100    Total Intake(mL/kg) 276.5 (2.4)    Urine (mL/kg/hr) 20 (0)    Other     Stool 301 (0.1) 100 (0.3)   Total Output 321 100   Net -44.5 -100          PHYSICAL EXAMINATION: General: No distress, extubated but lethargic. Neuro:  Follows commands, moves extremities. HEENT: [place]/AT, PERRL, EOM-I and -LAN. Cardiovascular: regular Lungs: scattered rhonchi Abdomen: soft, non tender Musculoskeletal:  No edema, RLE with partial hard cast Skin: no rashes  LABS: CBC Recent Labs     01/07/13  0400  01/08/13  0320  01/09/13  0430  WBC  21.2*  20.0*  19.7*  HGB  10.0*  9.8*  9.7*  HCT  31.1*  30.7*  29.4*  PLT  155  213  279   BMET Recent Labs     01/07/13  1600  01/08/13  0320  01/09/13  0430  NA  135  133*  136  K  4.4  4.8  5.4*  CL  96  96  95*  CO2  24  20  18*  BUN  74*  94*  144*  CREATININE  3.67*  4.82*  8.95*  GLUCOSE  113*  120*  101*    Electrolytes Recent Labs     01/07/13  0400  01/07/13  1600  01/08/13  0320  01/09/13  0430  CALCIUM  8.7  8.5  8.3*  8.4  MG  3.0*   --   3.2*   --   PHOS  6.1*  7.0*  8.4*  13.2*   ABG No results found for this basename: PHART, PCO2ART, PO2ART,  in the last 72 hours  Liver Enzymes Recent Labs     01/07/13  1600  01/08/13   0320  01/09/13  0430  AST   --   61*   --   ALT   --   159*   --   ALKPHOS   --   180*   --   BILITOT   --   0.5   --   ALBUMIN  2.6*  2.5*  2.6*   Glucose Recent Labs     01/08/13  1346  01/08/13  1602  01/08/13  1949  01/08/13  2314  01/09/13  0330  01/09/13  0725  GLUCAP  126*  119*  123*  116*  96  101*    Imaging Dg Chest Port 1 View  01/09/2013   *RADIOLOGY REPORT*  Clinical Data: Central catheter placement  PORTABLE CHEST - 1 VIEW  Comparison: Study obtained earlier in the day  Findings: Dual lumen catheter tip is at the cavoatrial junction. Left-sided catheter tip is in the right atrium.  No pneumothorax.  There is mild atelectasis in the left base.  Elsewhere, lungs are clear.  Heart is upper normal in size with normal pulmonary vascularity.  No adenopathy.  IMPRESSION: Catheters as described without pneumothorax.  Mild left base atelectasis.   Original Report Authenticated By: Bretta Bang, M.D.   Dg Chest Port 1 View  01/09/2013   *RADIOLOGY REPORT*  Clinical Data: Follow up pneumonia  PORTABLE CHEST - 1 VIEW  Comparison: Prior radiograph from 01/08/2013  Findings: The the patient has been extubated in the interim.  Right IJ Cordis catheter is stable in position.  Enteric tube is been removed.  Cardiac silhouette is unchanged.  The lungs remain hypoinflated.  There is been interval improvement in aeration of both lung bases with improved bibasilar opacities. Veiling opacity overlying the costophrenic angles are consistent with small pleural effusions, slightly larger on the left.  Osseous structures are unchanged.  IMPRESSION: 1.  Interval improvement in aeration of both lung bases with improved bibasilar opacities status post extubation. Particularly, dense opacity at the left lung base is improved, which may represent improved atelectasis, pleural fluid,  and / or infiltrate. 2.  Small bilateral pleural effusions, left greater than right.   Original Report Authenticated By:  Rise Mu, M.D.   Dg Chest Port 1 View  01/08/2013   *RADIOLOGY REPORT*  Clinical Data: Follow up pneumonia  PORTABLE CHEST - 1 VIEW  Comparison: Prior chest x-ray 01/07/2013  Findings: The endotracheal tube is 3.5 cm above the carina.  Left subclavian approach central venous catheter with the tip at the superior cavoatrial junction.  The right IJ approach temporary hemodialysis catheter is noted with the tip in the upper SVC. Visualized nasogastric tube in unchanged position.  Cardiac and  mediastinal contours are unchanged.  Inspiratory volumes remain very low and there is left basilar atelectasis versus infiltrate. Small right pleural effusion is improving and there is improved aeration in the right base.  IMPRESSION:  1.  Stable and satisfactory support apparatus. 2.  Similar appearance of dense left basilar opacity which may represent a combination of pleural fluid with atelectasis and / or infiltrate. 3.  Improved aeration in the right base with decreasing pleural fluid and opacity.   Original Report Authenticated By: Malachy Moan, M.D.   ASSESSMENT / PLAN:  PULMONARY A: Acute respiratory failure in setting of aspiration pneumonia and acute pulmonary edema from accidental opiate overdose. P:   - Titrate O2 for sats. - F/u CXR - CPAP QHS. - IS and flutter valve.  CARDIOVASCULAR A: Acute systolic CHF - EF 35%. Cardiogenic shock >> resolved. VT cardiac arrest >> resolved. HTN. Hyperlipidemia. P:  - Continue coreg, hydralazine isosorbide. - Echo in 8 weeks from initial event. - Hold simvastatin until LFT's improved  RENAL A: Acute kidney injury 2nd to shock. Hyperkalemia >> on HD. Lactic acidosis >> resolved. P:   - HD catheter to placed today then HD per renal. - Negative fluid balance as tolerated via HD. - Treat hyperkalemia via HD.  GASTROINTESTINAL A: Nutrition. Elevated LFT's 2nd to shock >> improved. P:   - Protonix for SUP. - Swallow study in AM. - F/u  LFT's intermittently.  HEMATOLOGIC A: Anemia >> Jehovah's witness. Thrombocytopenia >> improved, HITT negative. P:  - Continue folate/MVI/thiamine - SQ heparin for DVT prevention  INFECTIOUS A: Aspiration pneumonia >> Staph aureus in BAL from 7/30 >> MSSA. P:  - Continue rocephin   ENDOCRINE A: Hyperglycemia. P:   - SSI  NEUROLOGIC A: Acute encephalopathy 2nd to VT arrest, opiate overdose, renal failure >> much improved. P:   - PRN fentanyl for pain.  Alyson Reedy, M.D. [facility] Pulmonary/Critical Care Medicine. Pager: [phone]. After hours pager: [phone].

## 2013-01-09 NOTE — Op Note (Signed)
01/09/2013  PREOP DIAGNOSIS: Chronic kidney disease  POSTOP DIAGNOSIS: Chronic kidney disease  PROCEDURE:  1. Removal of right IJ temporary HD catheter 2. Ultrasound guided placement of right IJ Diatek catheter  SURGEON: Di Kindle. Edilia Bo, MD, FACS  ASSIST: none  ANESTHESIA: local with sedation   EBL: minimal  FINDINGS: patent right IJ  TECHNIQUE: The patient was taken to the operating room and sedated by anesthesia. The previous temporary catheter entered the vein high in the neck and I felt that changing the catheter over a wire would result in kinking of the catheter. Therefore, this catheter was removed and pressure held for hemostasis. The neck and upper chest were prepped and draped in the usual sterile fashion. After the skin was anesthetized with 1% lidocaine, and under ultrasound guidance, the right IJ was cannulated and a guidewire introduced into the superior vena cava under fluoroscopic control. The tract over the wire was dilated and then the dilator and peel-away sheath were passed over the wire and the wire and dilator removed. The catheter was passed through the peel-away sheath and positioned in the right atrium. The exit site for the catheter was selected and the skin anesthetized between the 2 areas. The catheter was then brought through the tunnel, cut to the appropriate length, and the distal ports were attached. Both ports withdrew easily, were then flushed with heparinized saline and filled with concentrated heparin. The catheter was secured at its exit site with a 3-0 nylon suture. The IJ cannulation site was closed with a 4-0 subcuticular stitch. A sterile dressing was applied. The patient tolerated the procedure well and was transferred to the recovery room in stable condition. All needle and sponge counts were correct.  Waverly Ferrari, MD, FACS [facility]  DATE OF OPERATION: 01/09/2013 DATE OF DICTATION: 01/09/2013

## 2013-01-09 NOTE — Anesthesia Preprocedure Evaluation (Addendum)
Anesthesia Evaluation  Patient identified by MRN, date of birth, ID band Patient awake    Reviewed: Allergy & Precautions, H&P , NPO status , Patient's Chart, lab work & pertinent test results  Airway Mallampati: III TM Distance: >3 FB Neck ROM: Full    Dental no notable dental hx. (+) Teeth Intact and Dental Advisory Given   Pulmonary sleep apnea ,  breath sounds clear to auscultation  Pulmonary exam normal       Cardiovascular negative cardio ROS  Rhythm:Regular Rate:Normal     Neuro/Psych negative neurological ROS  negative psych ROS   GI/Hepatic negative GI ROS, Neg liver ROS,   Endo/Other  Morbid obesity  Renal/GU ESRF and DialysisRenal disease  negative genitourinary   Musculoskeletal   Abdominal   Peds  Hematology negative hematology ROS (+)   Anesthesia Other Findings   Reproductive/Obstetrics negative OB ROS                          Anesthesia Physical Anesthesia Plan  ASA: III  Anesthesia Plan: MAC   Post-op Pain Management:    Induction: Intravenous  Airway Management Planned: Simple Face Mask  Additional Equipment:   Intra-op Plan:   Post-operative Plan:   Informed Consent: I have reviewed the patients History and Physical, chart, labs and discussed the procedure including the risks, benefits and alternatives for the proposed anesthesia with the patient or authorized representative who has indicated his/her understanding and acceptance.   Dental advisory given  Plan Discussed with: CRNA and Surgeon  Anesthesia Plan Comments:        Anesthesia Quick Evaluation

## 2013-01-09 NOTE — Interval H&P Note (Signed)
History and Physical Interval Note:  01/09/2013 7:28 AM  Nicholas Potts  has presented today for surgery, with the diagnosis of end stage renal disease  The various methods of treatment have been discussed with the patient and family. After consideration of risks, benefits and other options for treatment, the patient has consented to  Procedure(s): INSERTION OF DIALYSIS CATHETER (N/A) as a surgical intervention .  The patient's history has been reviewed, patient examined, no change in status, stable for surgery.  I have reviewed the patient's chart and labs.  Questions were answered to the patient's satisfaction.     [name] S

## 2013-01-09 NOTE — Progress Notes (Addendum)
Subjective: Extubated on 2L [place] yesterday.  Last HD 01/07/13 S/p tunneled R IJ CVC today Pt w/o complaints.  Asks about HD  Active Meds Reviewed  Physical Exam:  Blood pressure 125/69, pulse 88, temperature 98.6 F (37 C), temperature source Oral, resp. rate 17, height 6' (1.829 m), weight 116.1 kg (255 lb 15.3 oz), SpO2 94.00%. 20mL UOP yesterday NAD, obese Breathing comfortably, diminished BS b/l RRR, nl s1s2.  Trace LEE ABD s/nt/nd.  Hypoactive BS No rashes Nonfocal neuro exam  ASSESSMENT 1. AKI 2/2 ATN [unclear baseline] / cardiogenic shock and contrast nephropathy 2. Cardiogenic shock 3. Hypoxic RF 4. Anemia 5. MSSA on BAL 6. Hyperphosphatemia  PLAN 1. HD today 2. Cont Aranesp, follow Hb 3. Hopeful for recovery of renal function 4. Begin binder, PHosLo 2 caps with meals   Sabra Heck MD 01/09/2013, 10:15 AM   Recent Labs Lab 01/07/13 1600 01/08/13 0320 01/09/13 0430  NA 135 133* 136  K 4.4 4.8 5.4*  CL 96 96 95*  CO2 24 20 18*  GLUCOSE 113* 120* 101*  BUN 74* 94* 144*  CREATININE 3.67* 4.82* 8.95*  CALCIUM 8.5 8.3* 8.4  PHOS 7.0* 8.4* 13.2*    Recent Labs Lab 01/03/13 0530  01/07/13 0400 01/08/13 0320 01/09/13 0430  WBC 22.8*  < > 21.2* 20.0* 19.7*  NEUTROABS 18.0*  --   --   --  15.2*  HGB 9.9*  < > 10.0* 9.8* 9.7*  HCT 31.4*  < > 31.1* 30.7* 29.4*  MCV 99.1  < > 96.9 97.2 94.8  PLT 58*  < > 155 213 279  < > = values in this interval not displayed.   Update: HD cathter poor Qb with session despite reversing lines.  Have cut session early and will do tPA dwell and bring back tomorrow for repeat session . If unsuccessful will have catheter replaced.  RS 1930

## 2013-01-09 NOTE — Anesthesia Procedure Notes (Signed)
Procedure Name: MAC Date/Time: 01/09/2013 8:32 AM Performed by: Marena Chancy Pre-anesthesia Checklist: Patient identified, Emergency Drugs available, Suction available, Patient being monitored and Timeout performed Patient Re-evaluated:Patient Re-evaluated prior to inductionOxygen Delivery Method: Nasal cannula Placement Confirmation: positive ETCO2

## 2013-01-09 NOTE — Transfer of Care (Signed)
Immediate Anesthesia Transfer of Care Note  Patient: Nicholas Potts  Procedure(s) Performed: Procedure(s): INSERTION OF DIALYSIS CATHETER (Right)  Patient Location: PACU  Anesthesia Type:MAC  Level of Consciousness: awake, alert  and oriented  Airway & Oxygen Therapy: Patient Spontanous Breathing and Patient connected to nasal cannula oxygen  Post-op Assessment: Report given to PACU RN  Post vital signs: Reviewed and stable  Complications: No apparent anesthesia complications

## 2013-01-09 NOTE — Anesthesia Postprocedure Evaluation (Signed)
  Anesthesia Post-op Note  Patient: Nicholas Potts  Procedure(s) Performed: Procedure(s): INSERTION OF DIALYSIS CATHETER (Right)  Patient Location: PACU  Anesthesia Type:MAC  Level of Consciousness: awake and alert   Airway and Oxygen Therapy: Patient Spontanous Breathing and Patient connected to nasal cannula oxygen  Post-op Pain: none  Post-op Assessment: Post-op Vital signs reviewed, Patient's Cardiovascular Status Stable, Respiratory Function Stable, Patent Airway and No signs of Nausea or vomiting  Post-op Vital Signs: Reviewed and stable  Complications: No apparent anesthesia complications

## 2013-01-09 NOTE — Progress Notes (Signed)
Cath closed withTPA 2mg  to each port to dwell until next tx 01/10/13 per Dr. Marisue Humble. Floor nurse also informed of the TPA in cath ports. Dialysis pending for 01/513

## 2013-01-09 NOTE — Evaluation (Signed)
Physical Therapy Evaluation Patient Details Name: Nicholas Potts MRN: [id number] DOB: [birth year] Today's Date: 01/09/2013 Time: 1201-1231 PT Time Calculation (min): 30 min  PT Assessment / Plan / Recommendation History of Present Illness  52 year old male for evaluation of shock. Note at the time of this evaluation the patient was intubated and had been coded 3 separate times. His history is obtained through discussions with critical care medicine and with his family. The patient had repair of his Achilles tendon 2 days prior to admission. He apparently had some difficulties with respiration postoperatively but otherwise did well. He has been taking pain medication as instructed based on his family's report. He was last seen lucid at 9 AM today. At approximately 11:30 AM his family found him unresponsive and "white foam coming from his mouth". He apparently was not complaining of dyspnea or chest pain since his surgery. EMS was called and he was brought to the emergency room where he suffered cardiac arrest 3 separate times. There is note of PEA arrest. I was called to perform an echocardiogram to assess his right heart with concern for pulmonary embolus. Upon my arrival the patient is in shock on 2 pressors and intubated and unresponsive.  Clinical Impression  Pt significantly weaker than PTA most likely requiring ST-SNF prior to d/c home due to pt unable to maintain R LE NWB and decreased activity tolerance from prolonged hospital stay. If pt receives advanced R LE WBIng orders pt may go be able to return home with HHPT and 24/7 assist.    PT Assessment  Patient needs continued PT services    Follow Up Recommendations  SNF;Supervision/Assistance - 24 hour    Does the patient have the potential to tolerate intense rehabilitation      Barriers to Discharge   pt significantly weaker than PTA, pending progression pt may need ST-SNF prior to d/c home    Equipment Recommendations  Rolling  walker with 5" wheels    Recommendations for Other Services     Frequency Min 5X/week    Precautions / Restrictions Precautions Precautions: Fall Required Braces or Orthoses:  (R LE in cast) Restrictions Weight Bearing Restrictions: Yes RLE Weight Bearing: Non weight bearing   Pertinent Vitals/Pain Denies R LE pain at rest but increases with upright position      Mobility  Bed Mobility Bed Mobility: Supine to Sit Supine to Sit: 2: Max assist;With rails;HOB elevated Details for Bed Mobility Assistance: assist for trunk elevation and R LE mvmt, definite use of rails Transfers Transfers: Sit to Stand;Stand to Sit;Stand Pivot Transfers Sit to Stand: 1: +2 Total assist;With upper extremity assist;From bed Sit to Stand: Patient Percentage: 40% Stand to Sit: 1: +2 Total assist;With upper extremity assist;To chair/3-in-1 Stand to Sit: Patient Percentage: 50% Stand Pivot Transfers: 1: +2 Total assist (with use of RW,) Stand Pivot Transfers: Patient Percentage: 50% Details for Transfer Assistance: pt unable to maintain  R LE NWB, appeared to place <50% weight down Ambulation/Gait Ambulation/Gait Assistance: Not tested (comment) (pt unable to tolerate)    Exercises     PT Diagnosis: Difficulty walking;Generalized weakness  PT Problem List: Decreased strength;Decreased activity tolerance;Decreased balance;Decreased mobility PT Treatment Interventions: DME instruction;Gait training;Stair training;Functional mobility training;Therapeutic activities;Therapeutic exercise     PT Goals(Current goals can be found in the care plan section) Acute Rehab PT Goals Patient Stated Goal: did not say PT Goal Formulation: With patient/family Time For Goal Achievement: 01/23/13 Potential to Achieve Goals: Good  Visit Information  Last PT Received  On: 01/09/13 Assistance Needed: +2 History of Present Illness: 52 year old male for evaluation of shock. Note at the time of this evaluation the  patient was intubated and had been coded 3 separate times. His history is obtained through discussions with critical care medicine and with his family. The patient had repair of his Achilles tendon 2 days prior to admission. He apparently had some difficulties with respiration postoperatively but otherwise did well. He has been taking pain medication as instructed based on his family's report. He was last seen lucid at 9 AM today. At approximately 11:30 AM his family found him unresponsive and "white foam coming from his mouth". He apparently was not complaining of dyspnea or chest pain since his surgery. EMS was called and he was brought to the emergency room where he suffered cardiac arrest 3 separate times. There is note of PEA arrest. I was called to perform an echocardiogram to assess his right heart with concern for pulmonary embolus. Upon my arrival the patient is in shock on 2 pressors and intubated and unresponsive.       Prior Functioning  Home Living Family/patient expects to be discharged to:: Private residence Living Arrangements: Children;Spouse/significant other Available Help at Discharge: Family;Available 24 hours/day Type of Home: House Home Access: Stairs to enter [employer] of Steps: 3 Entrance Stairs-Rails: Right Home Layout: One level Home Equipment: Crutches Prior Function Level of Independence: Needs assistance Gait / Transfers Assistance Needed: pt with limited ambulation due to pain, pt with difficulty maintaining R LE NWB ADL's / Homemaking Assistance Needed: family assisted due to R LE NWB Communication Communication: Prefers language other than English (spanish speaking) Dominant Hand: Right    Cognition  Cognition Arousal/Alertness: Awake/alert Behavior During Therapy: WFL for tasks assessed/performed Overall Cognitive Status: Within Functional Limits for tasks assessed    Extremity/Trunk Assessment Upper Extremity Assessment Upper Extremity  Assessment: Generalized weakness Lower Extremity Assessment Lower Extremity Assessment: RLE deficits/detail RLE Deficits / Details: R LE in cast = no ankle ROM Cervical / Trunk Assessment Cervical / Trunk Assessment: Normal   Balance Balance Balance Assessed: Yes Static Sitting Balance Static Sitting - Balance Support: Bilateral upper extremity supported Static Sitting - Level of Assistance: 4: Min assist Static Sitting - Comment/# of Minutes: 4 min Static Standing Balance Static Standing - Balance Support: Bilateral upper extremity supported Static Standing - Level of Assistance: 2: Max assist Static Standing - Comment/# of Minutes: 1 min for hygiene s/p BM  End of Session PT - End of Session Equipment Utilized During Treatment: Gait belt Activity Tolerance: Patient limited by fatigue;Patient limited by pain Patient left: in chair;with call bell/phone within reach;with family/visitor present Nurse Communication: Mobility status (RN present to assist pt)  GP     Marcene Brawn 01/09/2013, 3:39 PM  Lewis Shock, PT, DPT Pager #: [phone] Office #: [phone]

## 2013-01-09 NOTE — Progress Notes (Signed)
SLP Cancellation Note  Patient Details Name: Nicholas Potts MRN: [id number] DOB: Mar 11, 1961   Cancelled treatment:         Attempted to see patient for Swallow Evaluation.  Pt. Currently in PACU.  Will return 8/5.   Maryjo Rochester T 01/09/2013, 9:13 AM

## 2013-01-09 NOTE — Progress Notes (Signed)
Upon receiving report on pt; HD RN came and took pt up to HD for treatment;

## 2013-01-09 NOTE — Progress Notes (Signed)
Subjective:  No chest pain except with cough or deep inspiration. Breathing better.   For recheck of swallow eval today. Currently strict NPO.  Objective:  Vital Signs in the last 24 hours: Temp:  [97.5 F (36.4 C)-98.7 F (37.1 C)] 97.5 F (36.4 C) (08/04 0723) Pulse Rate:  [89-90] 89 (08/04 0349) Resp:  [17-30] 21 (08/04 0600) BP: (113-149)/(58-86) 141/86 mmHg (08/04 0600) SpO2:  [95 %-99 %] 96 % (08/04 0600) Weight:  [255 lb 15.3 oz (116.1 kg)] 255 lb 15.3 oz (116.1 kg) (08/04 0500)  Intake/Output from previous day: 08/03 0701 - 08/04 0700 In: 276.5 [I.V.:84.8; NG/GT:91.7; IV Piggyback:100] Out: 321 [Urine:20; Stool:301]  Physical Exam: Pt is alert and oriented, weak HEENT: normal Neck: JVP - normal but difficult to assess secondary to body habitus, carotids 2+/= without bruits Lungs: coarse rales bilateral bases. CV: RRR without murmur or gallop; heart sounds a little distant Abd: soft, NT, Positive BS, no hepatomegaly Ext: no C/C/E, distal pulses intact and equal; RLE in a splint, not disturbed Skin: warm/dry no rash   Lab Results:  Recent Labs  01/08/13 0320 01/09/13 0430  WBC 20.0* 19.7*  HGB 9.8* 9.7*  PLT 213 279    Recent Labs  01/08/13 0320 01/09/13 0430  NA 133* 136  K 4.8 5.4*  CL 96 95*  CO2 20 18*  GLUCOSE 120* 101*  BUN 94* 144*  CREATININE 4.82* 8.95*   Lab Results  Component Value Date   TROPONINI 0.66* 12/31/2012   Cardiac Studies: Cath: non-obstructive disease; EF 20%  Echo: 12/31/2012 - Left ventricle: Impella noted in LV cavity. The cavity size was normal. Wall thickness was increased in a pattern of mild LVH. The estimated ejection fraction was 35%. Periapical hypokinesis. - Aortic valve: Cannot comment on aortic insufficiency due to Impella flow. Impressions: - Limited study for Impella position. LV systolic function does look a bit better than on the prior study.  Tele: SR, no significant ectopy.  Dg Chest Port  1 View 01/09/2013   *RADIOLOGY REPORT*  Clinical Data: Central catheter placement  PORTABLE CHEST - 1 VIEW  Comparison: Study obtained earlier in the day  Findings: Dual lumen catheter tip is at the cavoatrial junction. Left-sided catheter tip is in the right atrium.  No pneumothorax.  There is mild atelectasis in the left base.  Elsewhere, lungs are clear.  Heart is upper normal in size with normal pulmonary vascularity.  No adenopathy.  IMPRESSION: Catheters as described without pneumothorax.  Mild left base atelectasis.   Original Report Authenticated By: Bretta Bang, M.D.   Dg Chest Port 1 View 01/09/2013   *RADIOLOGY REPORT*  Clinical Data: Follow up pneumonia  PORTABLE CHEST - 1 VIEW  Comparison: Prior radiograph from 01/08/2013  Findings: The the patient has been extubated in the interim.  Right IJ Cordis catheter is stable in position.  Enteric tube is been removed.  Cardiac silhouette is unchanged.  The lungs remain hypoinflated.  There is been interval improvement in aeration of both lung bases with improved bibasilar opacities. Veiling opacity overlying the costophrenic angles are consistent with small pleural effusions, slightly larger on the left.  Osseous structures are unchanged.  IMPRESSION: 1.  Interval improvement in aeration of both lung bases with improved bibasilar opacities status post extubation. Particularly, dense opacity at the left lung base is improved, which may represent improved atelectasis, pleural fluid,  and / or infiltrate. 2.  Small bilateral pleural effusions, left greater than right.   Original  Report Authenticated By: Rise Mu, M.D.    Scheduled Meds: . antiseptic oral rinse  15 mL Mouth Rinse QID  . carvedilol  3.125 mg Per NG tube BID WC  . cefTRIAXone (ROCEPHIN)  IV  2 g Intravenous Q24H  . chlorhexidine  15 mL Mouth Rinse BID  . darbepoetin (ARANESP) injection - NON-DIALYSIS  150 mcg Subcutaneous Q Mon-1800  . heparin subcutaneous  5,000 Units  Subcutaneous Q8H  . hydrALAZINE  25 mg Oral Q8H  . insulin aspart  0-9 Units Subcutaneous Q4H  . isosorbide dinitrate  10 mg Per Tube TID  . nystatin   Topical TID  . nystatin-triamcinolone   Topical TID  . pantoprazole (PROTONIX) IV  40 mg Intravenous Q24H  . sodium chloride  10-40 mL Intracatheter Q12H   Continuous Infusions: . sodium chloride Stopped (01/01/13 1400)  . sodium chloride Stopped (12/31/12 2300)    Assessment/Plan:  52 yo male with no previous cardiac issues was admitted 7/24 with AMS 2nd narcotics after right ankle surgery. Resp arrest->cardiac arrest w/ VT. Coded x 3. Cath w/ non-obs dz, EF 20%. CGS with Impella device placed at cath, out 7/26. Extubated 8/3. Cx positive for MSSA, abx per CCM. Acute renal failure, was getting CRRT, d/c'd when catheter clotted, R IJ Diatek placed 8/4.  Active Problems:   Acute respiratory failure - mgt per CCM   Cardiogenic shock - volume mgt per nephrology, off pressors since 7/31.    Altered mental status   Acute renal failure (ARF) - for HD today.   Hyperkalemia   Acidosis   Cardiac arrest - on low-dose Coreg, pressure may tolerate an increase, MD advise on LifeVest. Recheck echo - ?in 2 months?    NICM  - No ACE with renal failure. On BB as BP/HR will allow. MD advise on increase Coreg to 6.25 BID.   Otherwise, per CCM.  Theodore Demark, M.D. 01/09/2013, 8:08 AM  Patient seen, examined. Available data reviewed. Agree with findings, assessment, and plan as outlined by Theodore Demark, PA-C. Pt independently interviewed and examined. He is extubated, awake, and alert. Heart is RRR without murmur, lungs clear anteriorly, legs without edema. Labs and radiographic data reviewed. Agree with increase carvedilol - will write order. Also recommend repeat 2D echo with echo contrast agent tomorrow to eval LV function and decide if LifeVest necessary at discharge. Otherwise appears to be progressing well.  Tonny Bollman, M.D. 01/09/2013 4:15  PM

## 2013-01-09 NOTE — Progress Notes (Signed)
eLink Physician-Brief Progress Note Patient Name: Nicholas Potts DOB: 1960-08-26 MRN: [id number]  Date of Service  01/09/2013   HPI/Events of Note  Call from nurse reporting new onset of rash under upper arms, back, and lateral flank.  Itching in arms only.  Started rocephin today.  Exam shows erythematous raised discrete areas under upper arms in areas of moisture.  There is no rash along the arms on abd other than flank area and backside.     eICU Interventions  Plan: Suspect yeast - will order nystatin with steroid.  Continue to monitor - check CBC with diff to evaluate eos - consider drug rxn if rash becomes more diffuse or does not respond to current intervention.   Intervention Category Minor Interventions: Routine modifications to care plan (e.g. PRN medications for pain, fever)  Nesa Distel 01/09/2013, 1:37 AM

## 2013-01-09 NOTE — Procedures (Signed)
I was present at this dialysis session. I have reviewed the session itself and made appropriate changes.   Sabra Heck  MD 01/09/2013, 7:28 PM

## 2013-01-10 ENCOUNTER — Encounter (HOSPITAL_COMMUNITY): Payer: Self-pay | Admitting: Vascular Surgery

## 2013-01-10 LAB — GLUCOSE, CAPILLARY
Glucose-Capillary: 125 mg/dL — ABNORMAL HIGH (ref 70–99)
Glucose-Capillary: 152 mg/dL — ABNORMAL HIGH (ref 70–99)
Glucose-Capillary: 95 mg/dL (ref 70–99)
Glucose-Capillary: 99 mg/dL (ref 70–99)

## 2013-01-10 LAB — CBC
HCT: 28.4 % — ABNORMAL LOW (ref 39.0–52.0)
Hemoglobin: 9.5 g/dL — ABNORMAL LOW (ref 13.0–17.0)
RBC: 2.99 MIL/uL — ABNORMAL LOW (ref 4.22–5.81)
WBC: 20.9 10*3/uL — ABNORMAL HIGH (ref 4.0–10.5)

## 2013-01-10 LAB — RENAL FUNCTION PANEL
Albumin: 2.5 g/dL — ABNORMAL LOW (ref 3.5–5.2)
Chloride: 96 mEq/L (ref 96–112)
Creatinine, Ser: 8.79 mg/dL — ABNORMAL HIGH (ref 0.50–1.35)
GFR calc non Af Amer: 6 mL/min — ABNORMAL LOW (ref >90)
Potassium: 5.1 mEq/L (ref 3.5–5.1)

## 2013-01-10 LAB — MAGNESIUM: Magnesium: 3.2 mg/dL — ABNORMAL HIGH (ref 1.5–2.5)

## 2013-01-10 MED ORDER — INSULIN ASPART 100 UNIT/ML ~~LOC~~ SOLN: 0.0000 [IU] | Freq: Three times a day (TID) | SUBCUTANEOUS | Status: DC

## 2013-01-10 MED ORDER — INSULIN ASPART 100 UNIT/ML ~~LOC~~ SOLN
0.0000 [IU] | Freq: Three times a day (TID) | SUBCUTANEOUS | Status: DC
  Administered 2013-01-11 – 2013-01-12 (×3): 1 [IU] via SUBCUTANEOUS
  Administered 2013-01-13: 5 [IU] via SUBCUTANEOUS
  Administered 2013-01-13: 2 [IU] via SUBCUTANEOUS
  Administered 2013-01-14: 7 [IU] via SUBCUTANEOUS
  Administered 2013-01-15 (×2): 1 [IU] via SUBCUTANEOUS
  Administered 2013-01-15 – 2013-01-20 (×5): 2 [IU] via SUBCUTANEOUS
  Administered 2013-01-27: 1 [IU] via SUBCUTANEOUS

## 2013-01-10 MED ORDER — ALTEPLASE 2 MG IJ SOLR
2.0000 mg | Freq: Once | INTRAMUSCULAR | Status: AC | PRN
  Filled 2013-01-10: qty 2

## 2013-01-10 MED ORDER — SODIUM CHLORIDE 0.9 % IV SOLN: 100.0000 mL | INTRAVENOUS | Status: DC | PRN

## 2013-01-10 MED ORDER — OXYCODONE HCL 5 MG PO TABS
ORAL_TABLET | ORAL | Status: AC
  Administered 2013-01-10: 10 mg
  Filled 2013-01-10: qty 2

## 2013-01-10 MED ORDER — PANTOPRAZOLE SODIUM 40 MG PO TBEC
40.0000 mg | DELAYED_RELEASE_TABLET | Freq: Every day | ORAL | Status: DC
  Administered 2013-01-10 – 2013-01-27 (×18): 40 mg via ORAL
  Filled 2013-01-10 (×16): qty 1

## 2013-01-10 MED ORDER — BIOTENE DRY MOUTH MT LIQD
15.0000 mL | Freq: Three times a day (TID) | OROMUCOSAL | Status: DC
  Administered 2013-01-11 – 2013-01-27 (×23): 15 mL via OROMUCOSAL

## 2013-01-10 MED ORDER — HEPARIN SODIUM (PORCINE) 1000 UNIT/ML DIALYSIS
20.0000 [IU]/kg | INTRAMUSCULAR | Status: DC | PRN
  Administered 2013-01-10 – 2013-01-17 (×2): 2300 [IU] via INTRAVENOUS_CENTRAL
  Filled 2013-01-10: qty 3

## 2013-01-10 NOTE — Evaluation (Signed)
Clinical/Bedside Swallow Evaluation Patient Details  Name: Nicholas Potts MRN: [id number] Date of Birth: August 01, 1960  Today's Date: 01/10/2013 Time: 4098-1191 SLP Time Calculation (min): 44 min  Past Medical History:  Past Medical History  Diagnosis Date  . Hyperlipidemia   . OSA (obstructive sleep apnea)   . CVA (cerebral infarction)    Past Surgical History:  Past Surgical History  Procedure Laterality Date  . Achilles tendon repair    . Insertion of dialysis catheter Right 01/09/2013    Procedure: INSERTION OF DIALYSIS CATHETER;  Surgeon: Chuck Hint, MD;  Location: [facility] OR;  Service: Vascular;  Laterality: Right;   HPI:  52 year old male for evaluation of shock. Note at the time of this evaluation the patient was intubated and had been coded 3 separate times. His history is obtained through discussions with critical care medicine and with his family. The patient had repair of his Achilles tendon 2 days prior to admission. He apparently had some difficulties with respiration postoperatively but otherwise did well. He has been taking pain medication as instructed based on his family's report. He was last seen lucid at 9 AM today. At approximately 11:30 AM his family found him unresponsive and "white foam coming from his mouth". He apparently was not complaining of dyspnea or chest pain since his surgery. EMS was called and he was brought to the emergency room where he suffered cardiac arrest 3 separate times   Assessment / Plan / Recommendation Clinical Impression  Pt. appears to be swallowing normally.  Timely swallow initiation, good laryngeal elevation palpated, no cough or throat clearing after swallows, and voice remains clear.    Aspiration Risk  Mild    Diet Recommendation Dysphagia 3 (Mechanical Soft);Thin liquid   Liquid Administration via: Cup;Straw Medication Administration: Whole meds with puree Supervision: Patient able to self feed;Full supervision/cueing  for compensatory strategies Compensations: Slow rate;Small sips/bites Postural Changes and/or Swallow Maneuvers: Seated upright 90 degrees    Other  Recommendations Oral Care Recommendations: Oral care QID Other Recommendations: Clarify dietary restrictions   Follow Up Recommendations  Inpatient Rehab    Frequency and Duration min 2x/week  2 weeks   Pertinent Vitals/Pain n/a    SLP Swallow Goals Patient will consume recommended diet without observed clinical signs of aspiration with: Minimal assistance Patient will utilize recommended strategies during swallow to increase swallowing safety with: Minimal assistance   Swallow Study Prior Functional Status       General Date of Onset: 12/29/12 HPI: 52 year old male for evaluation of shock. Note at the time of this evaluation the patient was intubated and had been coded 3 separate times. His history is obtained through discussions with critical care medicine and with his family. The patient had repair of his Achilles tendon 2 days prior to admission. He apparently had some difficulties with respiration postoperatively but otherwise did well. He has been taking pain medication as instructed based on his family's report. He was last seen lucid at 9 AM today. At approximately 11:30 AM his family found him unresponsive and "white foam coming from his mouth". He apparently was not complaining of dyspnea or chest pain since his surgery. EMS was called and he was brought to the emergency room where he suffered cardiac arrest 3 separate times Type of Study: Bedside swallow evaluation Previous Swallow Assessment: BSE 8/3, Cont. NPO Diet Prior to this Study: NPO Temperature Spikes Noted: No Respiratory Status: Supplemental O2 delivered via (comment) History of Recent Intubation: Yes Length of Intubations (days): 9  days Date extubated: 01/08/13 Behavior/Cognition: Alert;Cooperative;Pleasant mood Oral Cavity - Dentition: Adequate natural  dentition Self-Feeding Abilities: Able to feed self;Needs assist;Needs set up Patient Positioning: Upright in bed Baseline Vocal Quality: Clear Volitional Cough: Strong Volitional Swallow: Able to elicit    Oral/Motor/Sensory Function Overall Oral Motor/Sensory Function: Appears within functional limits for tasks assessed   Ice Chips Ice chips: Within functional limits Presentation: Spoon   Thin Liquid Thin Liquid: Within functional limits Presentation: Cup;Straw    Nectar Thick Nectar Thick Liquid: Not tested   Honey Thick Honey Thick Liquid: Not tested   Puree Puree: Within functional limits Presentation: Spoon   Solid   GO    Solid: Within functional limits Presentation: Self Daine Gravel, Dilana Mcphie T 01/10/2013,4:49 PM

## 2013-01-10 NOTE — Progress Notes (Signed)
PULMONARY  / CRITICAL CARE MEDICINE  Name: Nicholas Potts MRN: [id number] DOB: September 09, 1960    ADMISSION DATE:  12/29/2012  REFERRING MD :  EDP- Dr. Dan Humphreys PRIMARY SERVICE: PCCM  CHIEF COMPLAINT:  Acute Overdose  BRIEF PATIENT DESCRIPTION: [name] who presented to [facility] ER on 7/24 with AMS related to oxycodone overdose.  Underwent R achilles tendon reconstruction 7/20 with RLE cast in place on admit.  COurse complicated by respiratory arrest initially, then full cardiac arrest. He had episodes of ventricular tachycardia and was defibrillated . He had CPR with return of spontaneous circulation. Emergent cath >> EF 20%, mild non obstructive CAD. Required placement of Impella device for cardiogenic shock  SIGNIFICANT EVENTS / STUDIES:  7/20 - R ankle surgery (? Type, no [facility] Records) ........................................................................................................ 7/24 - Admit to [facility] with ER with acute oxy IR OD 7/25 duplex neg DVT 7/26- impella out 7/28-Dobutamine 7/28 eeg- This EEG is abnormal with moderately severe generalized nonspecific slowing of cerebral activity, which can be seen with a wide variety of encephalopathic processes. No epileptic activity was recorded.  7/29 - vent settings improved to peep 5, neg 2.5 liters cvvhd 7/30 upper ext doppler US - no obvious evidence of DVT of R or L upper ext, appears to be superficial thrombosis in R basilic and antecubital communicating veins 7/30 bronch - no significant secretions, no major distal plugging, BAL RLL, clear, white 8/1- weaning well, excellent neurostatus, neg 3.7 liters  LINES / TUBES: L [place] TLC 7/24>>> OETT 7/24>>>8/03 [place] 7/24>>>729 Femoral [place] 7/24>>>7/27 Impella device 7/24>>>7/26 R HD Catheter 8/4>>>  CULTURES: Sputum 7/25>>>non-pathogenic oropharyngeal-type flora insolated (rare WBC present, mostly PMN) BCx2 7/24>>>ng UC 7/24>>>ng C. Diff 7/29>>>neg Bronch bal 7/30>>>rare WBC,  mostly PMN>>>Staph aureus  ANTIBIOTICS: Vanco 7/24>>>>7/28 Zosyn 7/2/4>>>7/28 Ceftriaxone 7/28>>>7/31>>>8/4>>> Vanco 8/1>>>8/2  SUBJECTIVE:   Neg 1.4 L today. Complaining of lower back pain this AM and attributes it to lying in bed. Denies CP or pain with deep inspiration. Daughter translating at bedside.   VITAL SIGNS: Temp:  [97.3 F (36.3 C)-98.6 F (37 C)] 97.4 F (36.3 C) (08/05 0400) Pulse Rate:  [71-104] 90 (08/05 0000) Resp:  [14-23] 19 (08/05 0600) BP: (95-148)/(43-86) 140/76 mmHg (08/05 0600) SpO2:  [87 %-100 %] 97 % (08/05 0600) FiO2 (%):  [0 %] 0 % (08/04 0920) Weight:  [248 lb 3.8 oz (112.6 kg)-251 lb 5.2 oz (114 kg)] 248 lb 10.9 oz (112.8 kg) (08/05 0500)  HEMODYNAMICS: CVP:  [4 mmHg] 4 mmHg  VENTILATOR SETTINGS: Vent Mode:  [-]  FiO2 (%):  [0 %] 0 %  INTAKE / OUTPUT: Intake/Output     08/04 0701 - 08/05 0700 08/05 0701 - 08/06 0700   P.O. 150    I.V. (mL/kg)     NG/GT     IV Piggyback 50    Total Intake(mL/kg) 200 (1.8)    Urine (mL/kg/hr)     Other 1484 (0.5)    Stool 153 (0.1)    Total Output 1637     Net -1437          Urine Occurrence 1 x      PHYSICAL EXAMINATION: General: No distress, extubated, alert.  Neuro:  Follows commands, moves extremities. HEENT: [place]/AT, PERRL, EOM-I and -LAN. Cardiovascular: regular but distant Lungs: scattered rhonchi Abdomen: soft, non tender, +BS Musculoskeletal:  No edema, RLE with partial hard cast Skin: no rashes  LABS: CBC Recent Labs     01/08/13  0320  01/09/13  0430  01/10/13  0545  WBC  20.0*  19.7*  20.9*  HGB  9.8*  9.7*  9.5*  HCT  30.7*  29.4*  28.4*  PLT  213  279  316   BMET Recent Labs     01/08/13  0320  01/09/13  0430  01/10/13  0545  NA  133*  136  137  K  4.8  5.4*  5.1  CL  96  95*  96  CO2  20  18*  21  BUN  94*  144*  118*  CREATININE  4.82*  8.95*  8.79*  GLUCOSE  120*  101*  100*    Electrolytes Recent Labs     01/08/13  0320  01/09/13  0430  01/10/13  0545   CALCIUM  8.3*  8.4  8.6  MG  3.2*   --   3.2*  PHOS  8.4*  13.2*  11.5*   ABG No results found for this basename: PHART, PCO2ART, PO2ART,  in the last 72 hours  Liver Enzymes Recent Labs     01/08/13  0320  01/09/13  0430  01/10/13  0545  AST  61*   --    --   ALT  159*   --    --   ALKPHOS  180*   --    --   BILITOT  0.5   --    --   ALBUMIN  2.5*  2.6*  2.5*   Glucose Recent Labs     01/09/13  0952  01/09/13  1203  01/09/13  1858  01/09/13  2006  01/10/13  0031  01/10/13  0353  GLUCAP  106*  105*  104*  103*  125*  95    Imaging Dg Chest Port 1 View  01/09/2013   *RADIOLOGY REPORT*  Clinical Data: Central catheter placement  PORTABLE CHEST - 1 VIEW  Comparison: Study obtained earlier in the day  Findings: Dual lumen catheter tip is at the cavoatrial junction. Left-sided catheter tip is in the right atrium.  No pneumothorax.  There is mild atelectasis in the left base.  Elsewhere, lungs are clear.  Heart is upper normal in size with normal pulmonary vascularity.  No adenopathy.  IMPRESSION: Catheters as described without pneumothorax.  Mild left base atelectasis.   Original Report Authenticated By: Bretta Bang, M.D.   Dg Chest Port 1 View  01/09/2013   *RADIOLOGY REPORT*  Clinical Data: Follow up pneumonia  PORTABLE CHEST - 1 VIEW  Comparison: Prior radiograph from 01/08/2013  Findings: The the patient has been extubated in the interim.  Right IJ Cordis catheter is stable in position.  Enteric tube is been removed.  Cardiac silhouette is unchanged.  The lungs remain hypoinflated.  There is been interval improvement in aeration of both lung bases with improved bibasilar opacities. Veiling opacity overlying the costophrenic angles are consistent with small pleural effusions, slightly larger on the left.  Osseous structures are unchanged.  IMPRESSION: 1.  Interval improvement in aeration of both lung bases with improved bibasilar opacities status post extubation. Particularly,  dense opacity at the left lung base is improved, which may represent improved atelectasis, pleural fluid,  and / or infiltrate. 2.  Small bilateral pleural effusions, left greater than right.   Original Report Authenticated By: Rise Mu, M.D.   ASSESSMENT / PLAN:  PULMONARY A: Acute respiratory failure in setting of aspiration pneumonia and acute pulmonary edema from accidental opiate overdose. P:   - Titrate  O2 for sats. - F/u CXR. - CPAP QHS. - IS and flutter valve.  CARDIOVASCULAR A: Acute systolic CHF - EF 35%. Cardiogenic shock >> resolved. VT cardiac arrest >> resolved. HTN. Hyperlipidemia. P:  - Continue coreg, hydralazine isosorbide. - Metoprolol q 4 hrs prn for htn - Cardiology plans for echo today to evaluate LV function post arrest - Simvastatin restarted per cards  RENAL A: Acute renal failure 2nd to shock. Hyperkalemia >> on HD. Lactic acidosis >> resolved. P:   - HD catheter placed yesterday without incident. HD began 8/4.  - Negative fluid balance as tolerated via HD. - Treat hyperkalemia via HD.  GASTROINTESTINAL A: Nutrition. Elevated LFT's 2nd to shock >> improved. P:   - Protonix for SUP. - Renal diet started 8/4 with fluid restriction of 1200 mL - SLP for swallow eval today 8/5.  - F/u LFT's intermittently.  HEMATOLOGIC A: Anemia >> Jehovah's witness. Thrombocytopenia >> improved, HITT negative. P:  - Continue folate/MVI/thiamine - Patient on darbepoetin weekly on Mondays.  - SQ heparin for DVT prevention  INFECTIOUS A: Aspiration pneumonia >> Staph aureus in BAL from 7/30 >> MSSA. P:  - Continue rocephin for total of 8 days then D/C.  ENDOCRINE A: Hyperglycemia. P:   - SSI  NEUROLOGIC A: Acute encephalopathy 2nd to VT arrest, opiate overdose, renal failure >> much improved. P:   - PRN fentanyl for pain.  Transfer to SDU and to [facility], PCCM will sign off, please call back if needed.  Alyson Reedy, M.D. [facility]  Pulmonary/Critical Care Medicine. Pager: [phone]. After hours pager: [phone].

## 2013-01-10 NOTE — Progress Notes (Signed)
    Subjective:  Feels ok. No CP or dyspnea. He is hungry and thirsty. Awaiting swallow eval.  Objective:  Vital Signs in the last 24 hours: Temp:  [97.3 F (36.3 C)-98.6 F (37 C)] 97.4 F (36.3 C) (08/05 0400) Pulse Rate:  [71-104] 90 (08/05 0000) Resp:  [14-22] 19 (08/05 0600) BP: (95-148)/(43-86) 140/76 mmHg (08/05 0600) SpO2:  [87 %-100 %] 97 % (08/05 0600) FiO2 (%):  [0 %] 0 % (08/04 0920) Weight:  [112.6 kg (248 lb 3.8 oz)-114 kg (251 lb 5.2 oz)] 112.8 kg (248 lb 10.9 oz) (08/05 0500)  Intake/Output from previous day: 08/04 0701 - 08/05 0700 In: 200 [P.O.:150; IV Piggyback:50] Out: 1637 [Stool:153]  Physical Exam: Pt is alert and oriented, obese male in NAD HEENT: normal Neck: JVP - unable to visualize because of body habitus Lungs: CTA bilaterally CV: RRR without murmur or gallop Abd: soft, NT, Positive BS Ext: no C/C/E Skin: warm/dry no rash  Lab Results:  Recent Labs  01/09/13 0430 01/10/13 0545  WBC 19.7* 20.9*  HGB 9.7* 9.5*  PLT 279 316    Recent Labs  01/09/13 0430 01/10/13 0545  NA 136 137  K 5.4* 5.1  CL 95* 96  CO2 18* 21  GLUCOSE 101* 100*  BUN 144* 118*  CREATININE 8.95* 8.79*   No results found for this basename: TROPONINI, CK, MB,  in the last 72 hours  Cardiac Studies: 2D Echo to be repeated today  Tele: Sinus rhythm, no arrhythmia, personally reviewed  Assessment/Plan:  1. Cardiogenic shock s/p cardiorespiratory arrest: resolved 2. Acute renal failure: now on HD, management per nephrology 3. 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 Tolerating medical therapy. Hopefully transient LV dysfunction related to prolonged arrest. Consider LifeVest if LVEF less than 30-35% 4. HTN - on carvedilol, hydralazine, idordil. Continue same  Overall he is making good progress. Will follow-up in am after echo results available.    Tonny Bollman, M.D. 01/10/2013, 8:13 AM

## 2013-01-10 NOTE — Progress Notes (Signed)
Physical Therapy Treatment Patient Details Name: Nicholas Potts MRN: [id number] DOB: [birth year] Today's Date: 01/10/2013 Time: 1006-1030 PT Time Calculation (min): 24 min  PT Assessment / Plan / Recommendation  History of Present Illness 52 year old male for evaluation of shock. Note at the time of this evaluation the patient was intubated and had been coded 3 separate times. His history is obtained through discussions with critical care medicine and with his family. The patient had repair of his Achilles tendon 2 days prior to admission. He apparently had some difficulties with respiration postoperatively but otherwise did well. He has been taking pain medication as instructed based on his family's report. He was last seen lucid at 9 AM today. At approximately 11:30 AM his family found him unresponsive and "white foam coming from his mouth". He apparently was not complaining of dyspnea or chest pain since his surgery. EMS was called and he was brought to the emergency room where he suffered cardiac arrest 3 separate times. There is note of PEA arrest. I was called to perform an echocardiogram to assess his right heart with concern for pulmonary embolus. Upon my arrival the patient is in shock on 2 pressors and intubated and unresponsive.   PT Comments   Pt progressing slowly with mobility.  Unable to maintain NWBing RLE at this time.  [name] present entire session & interpreted session.  Cont to recommend SNF at d/c unless pt receives advanced RLE WBing orders & mobility progresses.     Follow Up Recommendations  SNF;Supervision/Assistance - 24 hour     Does the patient have the potential to tolerate intense rehabilitation     Barriers to Discharge        Equipment Recommendations  Rolling walker with 5" wheels    Recommendations for Other Services    Frequency Min 5X/week   Progress towards PT Goals Progress towards PT goals: Progressing toward goals (slowly)  Plan Current plan  remains appropriate    Precautions / Restrictions Precautions Precautions: Fall Restrictions Weight Bearing Restrictions: Yes RLE Weight Bearing: Non weight bearing   Pertinent Vitals/Pain No pain reported.      Mobility  Bed Mobility Bed Mobility: Supine to Sit Supine to Sit: HOB flat;With rails;4: Min assist Details for Bed Mobility Assistance: Max cues for technique & use of UE's to assist with sitting upright Transfers Transfers: Sit to Stand;Stand to Sit Sit to Stand: 1: +2 Total assist;From bed;With upper extremity assist Sit to Stand: Patient Percentage: 40% Stand to Sit: 3: Mod assist;With upper extremity assist;To bed Details for Transfer Assistance: Cues for hand placement & R LE NWB.  Pt unable to maintain NWBing.   Ambulation/Gait Ambulation/Gait Assistance: 1: +2 Total assist Ambulation Distance (Feet):  (3 side steps towards HOB) Assistive device: Rolling walker Ambulation/Gait Assistance Details: Pt unable to maintain NWBing RLE.       PT Goals (current goals can now be found in the care plan section) Acute Rehab PT Goals Patient Stated Goal: did not say PT Goal Formulation: With patient/family Time For Goal Achievement: 01/23/13 Potential to Achieve Goals: Good  Visit Information  Last PT Received On: 01/10/13 Assistance Needed: +2 History of Present Illness: 52 year old male for evaluation of shock. Note at the time of this evaluation the patient was intubated and had been coded 3 separate times. His history is obtained through discussions with critical care medicine and with his family. The patient had repair of his Achilles tendon 2 days prior to admission. He apparently had some  difficulties with respiration postoperatively but otherwise did well. He has been taking pain medication as instructed based on his family's report. He was last seen lucid at 9 AM today. At approximately 11:30 AM his family found him unresponsive and "white foam coming from his  mouth". He apparently was not complaining of dyspnea or chest pain since his surgery. EMS was called and he was brought to the emergency room where he suffered cardiac arrest 3 separate times. There is note of PEA arrest. I was called to perform an echocardiogram to assess his right heart with concern for pulmonary embolus. Upon my arrival the patient is in shock on 2 pressors and intubated and unresponsive.    Subjective Data  Patient Stated Goal: did not say   Cognition  Cognition Arousal/Alertness: Awake/alert Behavior During Therapy: WFL for tasks assessed/performed Overall Cognitive Status: Within Functional Limits for tasks assessed    Balance  Balance Balance Assessed: No  End of Session PT - End of Session Equipment Utilized During Treatment: Gait belt Activity Tolerance: Patient tolerated treatment well Patient left: in bed;with call bell/phone within reach;with family/visitor present Nurse Communication: Mobility status   GP     Lara Mulch 01/10/2013, 12:54 PM  Verdell Face, PTA [phone] 01/10/2013

## 2013-01-10 NOTE — Progress Notes (Signed)
SLP Cancellation Note  Patient Details Name: Nicholas Potts MRN: [id number] DOB: 07-Nov-1960   Cancelled treatment: Attempt x1 to complete BSE.  Not completed as patient in HD.  Will re-attempt this date. Moreen Fowler MS, CCC-SLP [phone] [facility] 01/10/2013, 11:05 AM

## 2013-01-10 NOTE — Progress Notes (Signed)
NUTRITION FOLLOW UP  Intervention:   1.  Modify diet; per SLP/MD discretion.  Pt has been NPO since extubation.  2.  Enteral nutrition; if pt unable to initiate PO diet, recommend initiation of Vital 1.2 @ 30 mL/hr.  Advance by 10 mL q 4 hrs to 75 mL/hr goal to provide 2160 kcal, 135g protein, and 1380 mL free water.   Nutrition Dx:   Inadequate oral intake, ongoing  Monitor:   1. Enteral nutrition; initiation with tolerance if appropriate 2. Wt/wt change; monitor trends.  Ongoing. 1.  Food/Beverage; pt meeting >/=90% estimated needs with tolerance if PO diet per SLP/MD discretion  Assessment:   Pt with recent Achilles tendon surgery admitted with AMS; etiology unclear.  Developed PEA arrest requiring CPR and intubation.  Pt has been extubated.  TFs held and pt is planning for SLP assessment.  Pt in HD at time of visit.  Per MD note, pt reporting hunger and thirst.  Diet per MD.  Pt currently with AKI and hopeful for recovery.  Stool output improved. Decreased UOP.    Height: Ht Readings from Last 1 Encounters:  12/29/12 6' (1.829 m)    Weight Status:   Wt Readings from Last 1 Encounters:  01/10/13 251 lb 1.7 oz (113.9 kg)  Admission wt: 268 lbs  Re-estimated needs:  Kcal: 2280-2500 Protein: 105-125g Fluid: ~2.4 L/day or per MD discretion based on dialysis needs  Skin: generalized edema  Diet Order: NPO   Intake/Output Summary (Last 24 hours) at 01/10/13 1121 Last data filed at 01/10/13 0800  Gross per 24 hour  Intake    160 ml  Output   1538 ml  Net  -1378 ml    Last BM: 8/5   Labs:   Recent Labs Lab 01/07/13 0400  01/08/13 0320 01/09/13 0430 01/10/13 0545  NA 135  < > 133* 136 137  K 4.3  < > 4.8 5.4* 5.1  CL 97  < > 96 95* 96  CO2 22  < > 20 18* 21  BUN 74*  < > 94* 144* 118*  CREATININE 3.61*  < > 4.82* 8.95* 8.79*  CALCIUM 8.7  < > 8.3* 8.4 8.6  MG 3.0*  --  3.2*  --  3.2*  PHOS 6.1*  < > 8.4* 13.2* 11.5*  GLUCOSE 115*  < > 120* 101* 100*   < > = values in this interval not displayed.  CBG (last 3)   Recent Labs  01/10/13 0031 01/10/13 0353 01/10/13 0801  GLUCAP 125* 95 99    Scheduled Meds: . antiseptic oral rinse  15 mL Mouth Rinse QID  . aspirin  325 mg Oral Daily  . calcium acetate  1,334 mg Oral TID WC  . carvedilol  6.25 mg Per NG tube BID WC  . cefTRIAXone (ROCEPHIN)  IV  2 g Intravenous Q24H  . chlorhexidine  15 mL Mouth Rinse BID  . darbepoetin (ARANESP) injection - NON-DIALYSIS  150 mcg Subcutaneous Q Mon-1800  . heparin subcutaneous  5,000 Units Subcutaneous Q8H  . hydrALAZINE  25 mg Oral Q8H  . insulin aspart  0-9 Units Subcutaneous Q4H  . isosorbide dinitrate  10 mg Per Tube TID  . multivitamin with minerals  1 tablet Oral Daily  . nystatin   Topical TID  . nystatin-triamcinolone   Topical TID  . pantoprazole  40 mg Oral Daily  . simvastatin  20 mg Oral q1800  . sodium chloride  10-40 mL Intracatheter Q12H  Continuous Infusions: . sodium chloride Stopped (01/01/13 1400)  . sodium chloride Stopped (12/31/12 2300)    Loyce Dys, MS RD LDN Clinical Inpatient Dietitian Pager: [phone] Weekend/After hours pager: [phone]

## 2013-01-10 NOTE — Progress Notes (Signed)
Subjective:  Nicholas Potts w/ acute systolic HF / cardiogenic shock and AKI 2/2 ATN, req RRT after R achilles repair 7/20 and cardiac arrest with VTach and resp failure req Impella device.  Poor blood flow on CVC yesterday at HD, treament cut short and TPA dwell o/n.  TTE scheduled today Urinated yesterday x1, none today Pt hungry, thirst Weak, but oob yesterday No complaints  Current meds: reviewed, includes PhosLo, carvedilol, aranesp 150, CTX,  Current Labs: reviewed, WBC 20.9; BUN 118, SCr 8.79  Physical Exam:  Blood pressure 128/75, pulse 90, temperature 98.6 F (37 C), temperature source Oral, resp. rate 22, height 6' (1.829 m), weight 112.8 kg (248 lb 10.9 oz), SpO2 92.00%. 24h I/O: 200/1637; no UOP yesterday NAD, appears comfortable RRR,nl s1s2 CTAB S/NT/ND No LEE No rashes/lesions; R IJ CVC bandaged No FOley Nonfocal, aaox3   Assessment/Plan 1. AKI 2/2 ATN [unclear baseline] / cardiogenic shock, arrest, and contrast nephropathy: HD again today; if CVC with poor flow will have exchanged.  Hopeful for recovery of function though little UOP to this point.   2. Cardiogenic shock; per cards, TTE today;  3. Hypoxic RF; resolved 4. Anemia; on aranesp, follow 5. MSSA on BAL  6. Hyperphosphatemia; Binders started 8/4  Sabra Heck MD 01/10/2013, 8:50 AM   Recent Labs Lab 01/08/13 0320 01/09/13 0430 01/10/13 0545  NA 133* 136 137  K 4.8 5.4* 5.1  CL 96 95* 96  CO2 20 18* 21  GLUCOSE 120* 101* 100*  BUN 94* 144* 118*  CREATININE 4.82* 8.95* 8.79*  CALCIUM 8.3* 8.4 8.6  PHOS 8.4* 13.2* 11.5*    Recent Labs Lab 01/08/13 0320 01/09/13 0430 01/10/13 0545  WBC 20.0* 19.7* 20.9*  NEUTROABS  --  15.2*  --   HGB 9.8* 9.7* 9.5*  HCT 30.7* 29.4* 28.4*  MCV 97.2 94.8 95.0  PLT 213 279 316    Current Facility-Administered Medications  Medication Dose Route Frequency Provider Last Rate Last Dose  . 0.9 %  sodium chloride infusion  250 mL Intravenous PRN Jeanella Craze, NP 0  mL/hr at 01/02/13 1700    . 0.9 %  sodium chloride infusion   Intravenous Continuous Alyson Reedy, MD      . 0.9 %  sodium chloride infusion   Intravenous Continuous Oretha Milch, MD      . 0.9 %  sodium chloride infusion  100 mL Intravenous PRN Arita Miss, MD      . 0.9 %  sodium chloride infusion  100 mL Intravenous PRN Arita Miss, MD      . antiseptic oral rinse (BIOTENE) solution 15 mL  15 mL Mouth Rinse QID Alyson Reedy, MD   15 mL at 01/10/13 0000  . aspirin tablet 325 mg  325 mg Oral Daily Chuck Hint, MD   325 mg at 01/09/13 2143  . calcium acetate (PHOSLO) capsule 1,334 mg  1,334 mg Oral TID WC Arita Miss, MD   1,334 mg at 01/10/13 0803  . carvedilol (COREG) tablet 6.25 mg  6.25 mg Per NG tube BID WC Tonny Bollman, MD   6.25 mg at 01/10/13 0806  . cefTRIAXone (ROCEPHIN) 2 g in dextrose 5 % 50 mL IVPB  2 g Intravenous Q24H Alyson Reedy, MD   2 g at 01/09/13 1020  . chlorhexidine (PERIDEX) 0.12 % solution 15 mL  15 mL Mouth Rinse BID Alyson Reedy, MD   15 mL at 01/10/13 0806  . darbepoetin (  ARANESP) injection 150 mcg  150 mcg Subcutaneous Q Mon-1800 Sadie Haber, MD   150 mcg at 01/09/13 1558  . feeding supplement (NEPRO CARB STEADY) liquid 237 mL  237 mL Oral PRN Arita Miss, MD      . fentaNYL (SUBLIMAZE) injection 25-100 mcg  25-100 mcg Intravenous Q2H PRN Coralyn Helling, MD   25 mcg at 01/09/13 2017  . heparin injection 1,000 Units  1,000 Units Dialysis PRN Arita Miss, MD      . heparin injection 5,000 Units  5,000 Units Subcutaneous Q8H Nelda Bucks, MD   5,000 Units at 01/10/13 0645  . hydrALAZINE (APRESOLINE) injection 10 mg  10 mg Intravenous Q4H PRN Coralyn Helling, MD      . hydrALAZINE (APRESOLINE) tablet 25 mg  25 mg Oral Q8H Lewayne Bunting, MD   25 mg at 01/10/13 0645  . insulin aspart (novoLOG) injection 0-9 Units  0-9 Units Subcutaneous Q4H Coralyn Helling, MD   1 Units at 01/10/13 0041  . isosorbide dinitrate (ISORDIL) tablet 10 mg  10  mg Per Tube TID Alyson Reedy, MD   10 mg at 01/09/13 2142  . lidocaine (PF) (XYLOCAINE) 1 % injection 5 mL  5 mL Intradermal PRN Arita Miss, MD      . lidocaine-prilocaine (EMLA) cream 1 application  1 application Topical PRN Arita Miss, MD      . metoprolol (LOPRESSOR) injection 5 mg  5 mg Intravenous Q4H PRN Coralyn Helling, MD      . multivitamin with minerals tablet 1 tablet  1 tablet Oral Daily Alyson Reedy, MD      . nystatin (MYCOSTATIN/NYSTOP) topical powder   Topical TID Zigmund Gottron, MD      . nystatin-triamcinolone [facility]) cream   Topical TID Zigmund Gottron, MD      . ondansetron [facility]) injection 4 mg  4 mg Intravenous Q6H PRN Tonny Bollman, MD      . oxyCODONE (Oxy IR/ROXICODONE) immediate release tablet 10 mg  10 mg Oral Q4H PRN Chuck Hint, MD      . pantoprazole (PROTONIX) EC tablet 40 mg  40 mg Oral Daily Alyson Reedy, MD      . pentafluoroprop-tetrafluoroeth (GEBAUERS) aerosol 1 application  1 application Topical PRN Arita Miss, MD      . simvastatin (ZOCOR) tablet 20 mg  20 mg Oral q1800 Chuck Hint, MD   20 mg at 01/09/13 2142  . sodium chloride 0.9 % injection 10-40 mL  10-40 mL Intracatheter Q12H Alyson Reedy, MD   30 mL at 01/09/13 2148  . sodium chloride 0.9 % injection 10-40 mL  10-40 mL Intracatheter PRN Alyson Reedy, MD      . traMADol Janean Sark) tablet 50 mg  50 mg Oral Daily PRN Chuck Hint, MD

## 2013-01-10 NOTE — Procedures (Signed)
I was present at this dialysis session. I have reviewed the session itself and made appropriate changes.   Qb 350.  Good access pressures.  Cont with CVC.  Sabra Heck  MD 01/10/2013, 11:44 AM

## 2013-01-11 ENCOUNTER — Inpatient Hospital Stay (HOSPITAL_COMMUNITY): Payer: BC Managed Care – PPO

## 2013-01-11 DIAGNOSIS — D696 Thrombocytopenia, unspecified: Secondary | ICD-10-CM | POA: Diagnosis present

## 2013-01-11 DIAGNOSIS — J690 Pneumonitis due to inhalation of food and vomit: Secondary | ICD-10-CM | POA: Diagnosis present

## 2013-01-11 DIAGNOSIS — I517 Cardiomegaly: Secondary | ICD-10-CM

## 2013-01-11 DIAGNOSIS — I5021 Acute systolic (congestive) heart failure: Secondary | ICD-10-CM | POA: Diagnosis not present

## 2013-01-11 DIAGNOSIS — D649 Anemia, unspecified: Secondary | ICD-10-CM | POA: Diagnosis not present

## 2013-01-11 DIAGNOSIS — T40601A Poisoning by unspecified narcotics, accidental (unintentional), initial encounter: Secondary | ICD-10-CM

## 2013-01-11 DIAGNOSIS — N170 Acute kidney failure with tubular necrosis: Secondary | ICD-10-CM | POA: Diagnosis present

## 2013-01-11 LAB — GLUCOSE, CAPILLARY
Glucose-Capillary: 108 mg/dL — ABNORMAL HIGH (ref 70–99)
Glucose-Capillary: 125 mg/dL — ABNORMAL HIGH (ref 70–99)
Glucose-Capillary: 132 mg/dL — ABNORMAL HIGH (ref 70–99)
Glucose-Capillary: 128 mg/dL — ABNORMAL HIGH (ref 70–99)

## 2013-01-11 LAB — RENAL FUNCTION PANEL
Albumin: 2.6 g/dL — ABNORMAL LOW (ref 3.5–5.2)
Calcium: 8.7 mg/dL (ref 8.4–10.5)
GFR calc Af Amer: 9 mL/min — ABNORMAL LOW (ref >90)
Glucose, Bld: 107 mg/dL — ABNORMAL HIGH (ref 70–99)
Phosphorus: 9.4 mg/dL — ABNORMAL HIGH (ref 2.3–4.6)
Potassium: 4.7 mEq/L (ref 3.5–5.1)
Sodium: 135 mEq/L (ref 135–145)

## 2013-01-11 LAB — CBC
HCT: 29.4 % — ABNORMAL LOW (ref 39.0–52.0)
Hemoglobin: 9.8 g/dL — ABNORMAL LOW (ref 13.0–17.0)
MCH: 31.9 pg (ref 26.0–34.0)
RBC: 3.07 MIL/uL — ABNORMAL LOW (ref 4.22–5.81)

## 2013-01-11 MED ORDER — PERFLUTREN LIPID MICROSPHERE
INTRAVENOUS | Status: AC
  Administered 2013-01-11: 2 mL
  Filled 2013-01-11: qty 10

## 2013-01-11 MED ORDER — PERFLUTREN LIPID MICROSPHERE
1.0000 mL | INTRAVENOUS | Status: AC | PRN
  Filled 2013-01-11: qty 10

## 2013-01-11 NOTE — Progress Notes (Signed)
ANTIBIOTIC CONSULT NOTE  - Follow Up Pharmacy Consult for Ceftriaxone Indication: pneumonia  No Known Allergies  Patient Measurements: Height: 6' (182.9 cm) Weight: 248 lb 3.8 oz (112.6 kg) IBW/kg (Calculated) : 77.6   Vital Signs: Temp: 99.3 F (37.4 C) (08/06 1117) Temp src: Oral (08/06 1117) BP: 131/72 mmHg (08/06 1117) Pulse Rate: 89 (08/06 1117) Intake/Output from previous day: 08/05 0701 - 08/06 0700 In: 470 [P.O.:420; IV Piggyback:50] Out: 2813 [Urine:25; Stool:2] Intake/Output from this shift: Total I/O In: 240 [P.O.:240] Out: -   Labs:  Recent Labs  01/09/13 0430 01/10/13 0545 01/11/13 0500  WBC 19.7* 20.9* 17.3*  HGB 9.7* 9.5* 9.8*  PLT 279 316 384  CREATININE 8.95* 8.79* 7.46*   Estimated Creatinine Clearance: 14.8 ml/min (by C-G formula based on Cr of 7.46).    Microbiology: Recent Results (from the past 720 hour(s))  URINE CULTURE     Status: None   Collection Time    12/29/12  3:08 PM      Result Value Range Status   Specimen Description URINE, CATHETERIZED   Final   Special Requests NONE   Final   Culture  Setup Time 12/29/2012 23:00   Final   Colony Count NO GROWTH   Final   Culture NO GROWTH   Final   Report Status 12/30/2012 FINAL   Final  CULTURE, BLOOD (ROUTINE X 2)     Status: None   Collection Time    12/29/12  3:27 PM      Result Value Range Status   Specimen Description BLOOD HAND LEFT   Final   Special Requests BOTTLES DRAWN AEROBIC ONLY 5CC   Final   Culture  Setup Time 12/29/2012 22:48   Final   Culture NO GROWTH 5 DAYS   Final   Report Status 01/04/2013 FINAL   Final  CULTURE, BLOOD (ROUTINE X 2)     Status: None   Collection Time    12/29/12  3:37 PM      Result Value Range Status   Specimen Description BLOOD ARM LEFT   Final   Special Requests BOTTLES DRAWN AEROBIC ONLY 2CC   Final   Culture  Setup Time 12/29/2012 22:48   Final   Culture NO GROWTH 5 DAYS   Final   Report Status 01/04/2013 FINAL   Final  URINE  CULTURE     Status: None   Collection Time    12/29/12  7:11 PM      Result Value Range Status   Specimen Description URINE, CATHETERIZED   Final   Special Requests NONE   Final   Culture  Setup Time 12/29/2012 21:09   Final   Colony Count NO GROWTH   Final   Culture NO GROWTH   Final   Report Status 12/31/2012 FINAL   Final  MRSA PCR SCREENING     Status: None   Collection Time    12/30/12  2:24 AM      Result Value Range Status   MRSA by PCR NEGATIVE  NEGATIVE Final   Comment:            The GeneXpert MRSA Assay (FDA     approved for NASAL specimens     only), is one component of a     comprehensive MRSA colonization     surveillance program. It is not     intended to diagnose MRSA     infection nor to guide or     monitor treatment for  MRSA infections.  CULTURE, RESPIRATORY (NON-EXPECTORATED)     Status: None   Collection Time    12/30/12 11:51 AM      Result Value Range Status   Specimen Description TRACHEAL ASPIRATE   Final   Special Requests NONE   Final   Gram Stain     Final   Value: RARE WBC PRESENT, PREDOMINANTLY PMN     NO SQUAMOUS EPITHELIAL CELLS SEEN     NO ORGANISMS SEEN   Culture Non-Pathogenic Oropharyngeal-type Flora Isolated.   Final   Report Status 01/02/2013 FINAL   Final  CLOSTRIDIUM DIFFICILE BY PCR     Status: None   Collection Time    01/03/13  4:35 PM      Result Value Range Status   C difficile by pcr NEGATIVE  NEGATIVE Final  CULTURE, BAL-QUANTITATIVE     Status: None   Collection Time    01/04/13  3:35 PM      Result Value Range Status   Specimen Description BRONCHIAL ALVEOLAR LAVAGE   Final   Special Requests BRONCH WASH LL LOBE    Final   Gram Stain     Final   Value: RARE WBC PRESENT, PREDOMINANTLY PMN     NO SQUAMOUS EPITHELIAL CELLS SEEN     NO ORGANISMS SEEN   Colony Count 30,000 COLONIES/ML   Final   Culture     Final   Value: STAPHYLOCOCCUS AUREUS     Note: RIFAMPIN AND GENTAMICIN SHOULD NOT BE USED AS SINGLE DRUGS FOR  TREATMENT OF STAPH INFECTIONS.   Report Status 01/07/2013 FINAL   Final   Organism ID, Bacteria STAPHYLOCOCCUS AUREUS   Final    Medical History: Past Medical History  Diagnosis Date  . Hyperlipidemia   . OSA (obstructive sleep apnea)   . CVA (cerebral infarction)     Assessment: 52 yo M admitted 12/29/2012 found unresponsive, ?OD>>pt coded. Pharmacy consulted to dose ceftriaxone for MSSA Pneumonia  Events: for TEE this am, HD yesterday,   AC: VTE Px (achilles tendon reconstruction 7/20), UE doppler with superficial thombosis in R basilic and antecubital veins. SQ heparin  ID: MSSA Pneumonia; afeb, wbc 17.2 trend down. D#5 ceftriaxone Rocephin 7/29>7/31 8/2>> Vanc 7/24>>7/29 8/1>>8/2 Zosyn 7/24>>7/29   BCx >> NGTD (final) Urine >> NG Sputum>>Normal Flora BAL>> staph aureus (30K) - MSSA.  Cdiff negative  BP: scds, HIT neg, sq heparin  Goal of Therapy:  Eradication of infection  Plan:  1. Continue ceftriaxone.  Define plan for course of therapy. 2. Follow up SCr, UOP, cultures, clinical course and adjust as clinically indicated. 3. Pharmacy will sign off a no further dose adjustments indicated.  Please call with questions.  Thank you for allowing pharmacy to be a part of this patients care team.  Lovenia Kim Pharm.D., BCPS Clinical Pharmacist 01/11/2013 11:21 AM Pager: [phone] Phone: [phone]

## 2013-01-11 NOTE — Progress Notes (Signed)
Speech Language Pathology Dysphagia Treatment Patient Details Name: Nicholas Potts MRN: [id number] DOB: [birth year] Today's Date: 01/11/2013 Time: 4098-1191 SLP Time Calculation (min): 30 min  Assessment / Plan / Recommendation Clinical Impression  Son and RN report 2 incidences of coughing when taking large consecutive swallows of thin liquids using a straw.  SLP observed pt. with lunch and educated pt. and son to only take small sips from the cup (no straws).  Pt. was able to tolerate thin liquids without difficulty when sipping from cup.  Pt. chews and swallows solids in a timely manner, with no pocketing or oral residue noted.  Will advance to regular diet, as oral prep phase appears normal.  Recommend continued aspiration precautions with thin liquids, no to include no straws.    Diet Recommendation  Initiate / Change Diet: Regular;Thin liquid    SLP Plan Continue with current plan of care   Pertinent Vitals/Pain n/a   Swallowing Goals  SLP Swallowing Goals Patient will consume recommended diet without observed clinical signs of aspiration with: Minimal assistance Patient will utilize recommended strategies during swallow to increase swallowing safety with: Minimal assistance Swallow Study Goal #2 - Progress: Progressing toward goal Goal #3: Consume diagnostic PO trials of various consistencies with no outward s/s of aspiration/change in vital signs to determine PO readiness Swallow Study Goal #3 - Progress: Met  General Temperature Spikes Noted: No Respiratory Status: Supplemental O2 delivered via (comment) Behavior/Cognition: Alert;Cooperative;Pleasant mood Oral Cavity - Dentition: Adequate natural dentition Patient Positioning: Upright in bed  Oral Cavity - Oral Hygiene Does patient have any of the following "at risk" factors?: None of the above Brush patient's teeth BID with toothbrush (using toothpaste with fluoride): Yes Patient is HIGH RISK - Oral Care Protocol  followed (see row info): Yes Patient is AT RISK - Oral Care Protocol followed (see row info): Yes Patient is mechanically ventilated, follow VAP prevention protocol for oral care: Oral care provided every 4 hours   Dysphagia Treatment Treatment focused on: Skilled observation of diet tolerance;Upgraded PO texture trials;Patient/family/caregiver Dealer Educated: Son Treatment Methods/Modalities: Skilled observation Patient observed directly with PO's: Yes Type of PO's observed: Dysphagia 3 (soft);Regular;Thin liquids Feeding: Able to feed self Liquids provided via: Cup;No straw   GO     Maryjo Rochester T 01/11/2013, 1:24 PM

## 2013-01-11 NOTE — Progress Notes (Signed)
Subjective: 2 weeks post iop from achilles tendon debridement and reconstruction.  No c/o heel pain.  Objective: Vital signs in last 24 hours: Temp:  [97.8 F (36.6 C)-99.8 F (37.7 C)] 99.3 F (37.4 C) (08/06 1117) Pulse Rate:  [79-104] 89 (08/06 1117) Resp:  [18-25] 19 (08/06 1117) BP: (84-131)/(41-75) 131/72 mmHg (08/06 1117) SpO2:  [91 %-100 %] 93 % (08/06 1117) Weight:  [111.4 kg (245 lb 9.5 oz)-112.6 kg (248 lb 3.8 oz)] 112.6 kg (248 lb 3.8 oz) (08/06 0455)  Intake/Output from previous day: 08/05 0701 - 08/06 0700 In: 470 [P.O.:420; IV Piggyback:50] Out: 2813 [Urine:25; Stool:2] Intake/Output this shift: Total I/O In: 410 [P.O.:360; IV Piggyback:50] Out: -    Recent Labs  01/09/13 0430 01/10/13 0545 01/11/13 0500  HGB 9.7* 9.5* 9.8*    Recent Labs  01/10/13 0545 01/11/13 0500  WBC 20.9* 17.3*  RBC 2.99* 3.07*  HCT 28.4* 29.4*  PLT 316 384    Recent Labs  01/10/13 0545 01/11/13 0500  NA 137 135  K 5.1 4.7  CL 96 95*  CO2 21 23  BUN 118* 81*  CREATININE 8.79* 7.46*  GLUCOSE 100* 107*  CALCIUM 8.6 8.7   No results found for this basename: LABPT, INR,  in the last 72 hours  PE:  posterior incision healing well.  No signs of infection.  Assessment/Plan: Sutures removed from right heel and calf wounds.  9/16" heel lift placed in cam boot.  Pt may bear weight on the right foot as tolerated with the heel lift in place.  Toni Arthurs 01/11/2013, 1:45 PM

## 2013-01-11 NOTE — Progress Notes (Signed)
18M w/ acute systolic HF / cardiogenic shock and AKI 2/2 ATN, req RRT after R achilles repair 7/20 and cardiac arrest with VTach and resp failure req Impella device.    Subjective:  HD yesterday, 2.8L UF.  CVC worked well after overnight TPA dwell TTE this AM Pt eating and drinking No SOB/CP Scant UOP  Current meds: reviewed includes PhosLo, Coreg, Aranesp, hydralazine,  Current Labs: reviewed  Physical Exam:  Blood pressure 106/58, pulse 86, temperature 98.8 F (37.1 C), temperature source Oral, resp. rate 18, height 6' (1.829 m), weight 112.6 kg (248 lb 3.8 oz), SpO2 93.00%.  08/05 0701 - 08/06 0700 In: 470 [P.O.:420; IV Piggyback:50] Out: 2813 [Urine:25; Stool:2]  NAD, appears well RRR, nl s1s2 CTAB, nl wob S/nt/nd Minimal LEE Nonfocal, aaox3  Assessment/Plan 1. AKI 2/2 ATN [unclear baseline] / cardiogenic shock, arrest, and contrast nephropathy:  Started HD 7/25.  HD yesterday.  Remains anuric.  Plan for HD tomorrow unless UOP improves.  Hopeful for recovery.   2. Cardiogenic shock; per cards, improved 3. Hypoxic RF; resolved  4. Anemia; on aranesp, follow  5. MSSA on BAL  6. Hyperphosphatemia; Binders started 8/4   Sabra Heck MD 01/11/2013, 8:19 AM   Recent Labs Lab 01/09/13 0430 01/10/13 0545 01/11/13 0500  NA 136 137 135  K 5.4* 5.1 4.7  CL 95* 96 95*  CO2 18* 21 23  GLUCOSE 101* 100* 107*  BUN 144* 118* 81*  CREATININE 8.95* 8.79* 7.46*  CALCIUM 8.4 8.6 8.7  PHOS 13.2* 11.5* 9.4*    Recent Labs Lab 01/09/13 0430 01/10/13 0545 01/11/13 0500  WBC 19.7* 20.9* 17.3*  NEUTROABS 15.2*  --   --   HGB 9.7* 9.5* 9.8*  HCT 29.4* 28.4* 29.4*  MCV 94.8 95.0 95.8  PLT 279 316 384    Current Facility-Administered Medications  Medication Dose Route Frequency Provider Last Rate Last Dose  . 0.9 %  sodium chloride infusion  250 mL Intravenous PRN Jeanella Craze, NP 0 mL/hr at 01/02/13 1700    . 0.9 %  sodium chloride infusion   Intravenous Continuous  Alyson Reedy, MD      . 0.9 %  sodium chloride infusion   Intravenous Continuous Oretha Milch, MD      . 0.9 %  sodium chloride infusion  100 mL Intravenous PRN Arita Miss, MD      . 0.9 %  sodium chloride infusion  100 mL Intravenous PRN Arita Miss, MD      . 0.9 %  sodium chloride infusion  100 mL Intravenous PRN Arita Miss, MD      . 0.9 %  sodium chloride infusion  100 mL Intravenous PRN Arita Miss, MD      . antiseptic oral rinse (BIOTENE) solution 15 mL  15 mL Mouth Rinse TID WC & HS Alyson Reedy, MD      . aspirin tablet 325 mg  325 mg Oral Daily Chuck Hint, MD   325 mg at 01/10/13 1448  . calcium acetate (PHOSLO) capsule 1,334 mg  1,334 mg Oral TID WC Arita Miss, MD   1,334 mg at 01/10/13 1804  . carvedilol (COREG) tablet 6.25 mg  6.25 mg Per NG tube BID WC Tonny Bollman, MD   6.25 mg at 01/10/13 1804  . cefTRIAXone (ROCEPHIN) 2 g in dextrose 5 % 50 mL IVPB  2 g Intravenous Q24H Alyson Reedy, MD   2 g at  01/10/13 1449  . darbepoetin (ARANESP) injection 150 mcg  150 mcg Subcutaneous Q Mon-1800 Sadie Haber, MD   150 mcg at 01/09/13 1558  . feeding supplement (NEPRO CARB STEADY) liquid 237 mL  237 mL Oral PRN Arita Miss, MD      . fentaNYL (SUBLIMAZE) injection 25-100 mcg  25-100 mcg Intravenous Q2H PRN Coralyn Helling, MD   25 mcg at 01/09/13 2017  . heparin injection 1,000 Units  1,000 Units Dialysis PRN Arita Miss, MD      . heparin injection 2,300 Units  20 Units/kg Dialysis PRN Arita Miss, MD   2,300 Units at 01/10/13 1038  . heparin injection 5,000 Units  5,000 Units Subcutaneous Q8H Nelda Bucks, MD   5,000 Units at 01/11/13 0502  . hydrALAZINE (APRESOLINE) injection 10 mg  10 mg Intravenous Q4H PRN Coralyn Helling, MD      . hydrALAZINE (APRESOLINE) tablet 25 mg  25 mg Oral Q8H Lewayne Bunting, MD   25 mg at 01/11/13 0502  . insulin aspart (novoLOG) injection 0-9 Units  0-9 Units Subcutaneous TID AC & HS Alyson Reedy, MD      .  lidocaine (PF) (XYLOCAINE) 1 % injection 5 mL  5 mL Intradermal PRN Arita Miss, MD      . lidocaine-prilocaine (EMLA) cream 1 application  1 application Topical PRN Arita Miss, MD      . metoprolol (LOPRESSOR) injection 5 mg  5 mg Intravenous Q4H PRN Coralyn Helling, MD      . multivitamin with minerals tablet 1 tablet  1 tablet Oral Daily Alyson Reedy, MD   1 tablet at 01/10/13 1448  . nystatin (MYCOSTATIN/NYSTOP) topical powder   Topical TID Zigmund Gottron, MD      . nystatin-triamcinolone [facility]) cream   Topical TID Zigmund Gottron, MD      . ondansetron [facility]) injection 4 mg  4 mg Intravenous Q6H PRN Tonny Bollman, MD      . oxyCODONE (Oxy IR/ROXICODONE) immediate release tablet 10 mg  10 mg Oral Q4H PRN Chuck Hint, MD      . pantoprazole (PROTONIX) EC tablet 40 mg  40 mg Oral Daily Alyson Reedy, MD   40 mg at 01/10/13 1448  . pentafluoroprop-tetrafluoroeth (GEBAUERS) aerosol 1 application  1 application Topical PRN Arita Miss, MD      . simvastatin (ZOCOR) tablet 20 mg  20 mg Oral q1800 Chuck Hint, MD   20 mg at 01/10/13 1805  . sodium chloride 0.9 % injection 10-40 mL  10-40 mL Intracatheter Q12H Alyson Reedy, MD   10 mL at 01/10/13 2157  . sodium chloride 0.9 % injection 10-40 mL  10-40 mL Intracatheter PRN Alyson Reedy, MD      . traMADol Janean Sark) tablet 50 mg  50 mg Oral Daily PRN Chuck Hint, MD

## 2013-01-11 NOTE — Progress Notes (Signed)
    Subjective:  No chest pain or shortness of breath  Objective:  Vital Signs in the last 24 hours: Temp:  [97.8 F (36.6 C)-99.8 F (37.7 C)] 99.8 F (37.7 C) (08/06 0400) Pulse Rate:  [79-104] 87 (08/06 0400) Resp:  [18-23] 18 (08/06 0400) BP: (84-128)/(41-75) 96/51 mmHg (08/06 0502) SpO2:  [91 %-99 %] 98 % (08/06 0400) Weight:  [111.4 kg (245 lb 9.5 oz)-113.9 kg (251 lb 1.7 oz)] 112.6 kg (248 lb 3.8 oz) (08/06 0455)  Intake/Output from previous day: 08/05 0701 - 08/06 0700 In: 470 [P.O.:420; IV Piggyback:50] Out: 2813 [Urine:25; Stool:2]  Physical Exam: Pt is alert and oriented, obese male in NAD HEENT: normal Neck: JVP - normal, carotids 2+= without bruits Lungs: CTA bilaterally CV: RRR without murmur or gallop Abd: soft, NT, Positive BS, no hepatomegaly Ext: no C/C/E Skin: warm/dry no rash  Lab Results:  Recent Labs  01/10/13 0545 01/11/13 0500  WBC 20.9* 17.3*  HGB 9.5* 9.8*  PLT 316 384    Recent Labs  01/10/13 0545 01/11/13 0500  NA 137 135  K 5.1 4.7  CL 96 95*  CO2 21 23  GLUCOSE 100* 107*  BUN 118* 81*  CREATININE 8.79* 7.46*   No results found for this basename: TROPONINI, CK, MB,  in the last 72 hours  Tele: Sinus rhythm, personally reviewed  Assessment/Plan:  1. Cardiogenic shock - resolved 2. Acute renal failure - still on HD 3. NICM - LVEF severely depressed post-arrest. Repeat echo today 4. HTN - BP low. Stop isordil today.  Tonny Bollman, M.D. 01/11/2013, 7:09 AM

## 2013-01-11 NOTE — Progress Notes (Signed)
Received pt. As a transfer from 2900.Pt. From home,pt. Uses a walker.pt. Was place on tele box # 21.keep monitoring pt. Closely and assessing his needs.

## 2013-01-11 NOTE — Progress Notes (Signed)
Echocardiogram 2D Echocardiogram limited with Definity has been performed.  Nicholas Potts 01/11/2013, 1:38 PM

## 2013-01-11 NOTE — Progress Notes (Signed)
[facility] Progress Note Nicholas Potts TEAM 1 - Stepdown/ICU TEAM   Nicholas Potts WUJ:[id number] DOB: 10/17/1960 DOA: 12/29/2012 PCP: No primary provider on file.  Brief narrative: 52 year old male patient who recently underwent right Achilles tendon reconstruction on 12/25/2012 by Dr. Victorino Dike in the outpatient setting. He presented to [facility] ER on 12/29/2012 with altered mental status suspected related to unintentional narcotic overdose. EMS arrived to the patient's home to find him apneic. They supported his respirations until he arrived to the ER. Because of continued apnea and low GCS score he was immediately intubated. Was found to be acidotic with leukocytosis and hypothermia therefore concerns initially were for sepsis. His ABG was consistent with respiratory acidosis. Critical care medicine evaluated the patient in the ER. Patient later developed hypotension with systolic blood pressures in the 60s and he subsequently developed profound bradycardia and became pulseless requiring CPR. Patient noted to have ventricular tachycardia during the resuscitation effort and required defibrillation and amiodarone. He later developed a pulse and stabilized breifly. He was subsequently admitted to the ICU.  Cardiology was also notified during the initial resuscitative period and an emergent echocardiogram was done to assess his right heart for concern for possible pulmonary embolus. No PE was found the patient appeared to have significant regional wall motion abnormalities. After the initial resuscitation in the emergency department patient subsequently coded 2 more times and was taken emergently to the cath lab. This revealed mild nonobstructive coronary disease with slow coronary flow with severe cardiogenic shock/severe LV systolic dysfunction with LV gram revealing an ejection fraction of 20%. Because of patient's ongoing shock an Impella hemodynamic support device was placed in addition to  multiple pressors agents.  In addition, in the acute postarrest phase the patient developed acute oliguric renal failure related to shock and nephrology was consulted. Patient did require placement of a hemodialysis cath and CVVHD was initiated. In the acute postarrest face patient had severe encephalopathy. A neurology consultation was obtained and an EEG was completed. This revealed moderate to severe generalized nonspecific slowing consistent with encephalopathic process. Vascular surgery was also consulted because the patient's initial temporary IJ catheter had ceased to function appropriately and a right IJ Diatek catheter was placed during this admission.  The patient eventually did wake up and was able to be extubated and is now alert and oriented. He does not speak English but his family is available to translate as needed. He eventually stabilized enough to be able to transfered out of ICU to the step down area - Team 1 assumed care of this patient on 01/11/2013.  Assessment/Plan:  Cardiac and respiratory arrest with VT due to Accidental opiate poisoning with Aspiration pneumonia -required ventilator but now stable on RA -last CXR 8/6: Mild subsegmental atelectasis left lung base w/ no acute abnormality -DC Rocephin - BAL was positive for MSSA - has completed > 7 days tx total   Cardiogenic shock/Acute systolic congestive heart failure, NYHA class 3 -Cards following -required Impella for circulatory support initially  -EF 20% per cath initially -nonobstructive CAD so suspect etiology 2/2 cardiac arrest and shock -Impella device removed 7/26 -FU ECHO pending -cont Coreg, Apresoline and Zocor  ARF (acute renal failure) with tubular necrosis requiring HD -Nephro following -hopeful for full renal recovery -has transitioned from CVVHD to HD -baseline Scr at admission was 1.76 with current Scr 7.46 -scant UOP at this point  HTN -as above -BP recently has dropped again so Cards dc'd  Isordil  Anemia/Thrombocytopenia, unspecified -primarily r/t  shock and recent critical illness/ARF -Aranesp per Nephro -platelets have normalized -hgb at admission was 16.3 but this likely r/t [facility] - follow up 2 days later ~ 12  Hyperglycemia -HgbA1c 5.6 this admission - mother has diabetes  Recent right Achilles tendon repair as an outpatient -Dr. Dyanne Iha did evaluate the patient during this admission, the last visit being on 01/07/2013 -Plans to follow along and direct mobility and physical therapy instructions as patient progresses   DVT prophylaxis: Subcutaneous heparin Code Status: Full Family Communication: Patient via son translating at bedside Disposition Plan/Expected LOS: Transfer to telemetry / renal  Consultants: Cardiology Nephrology Neurology Vascular surgery Orthopedic surgery  Procedures: Left heart catheterization  12/30/2012 1. Severe cardiogenic shock with severe left ventricular systolic dysfunction  2. Mild nonobstructive coronary artery disease with slow coronary flow  EEG  01/02/2013 abnormal with moderately severe generalized nonspecific slowing of cerebral activity, which can be seen with a wide variety of encephalopathic processes. No epileptic activity was recorded.  Removal of right IJ temporary HD catheter and Ultrasound guided placement of right IJ Diatek catheter  01/09/2013  CULTURES:  Sputum 7/25>>>non-pathogenic oropharyngeal-type flora insolated (rare WBC present, mostly PMN)  BCx2 7/24>>>ng  UC 7/24>>>ng  C. Diff 7/29>>>neg  Bronch bal 7/30>>>rare WBC, mostly PMN>>>Staph aureus  Antibiotics: Vanco 7/24>>>>7/28  Zosyn 7/2/4>>>7/28  Ceftriaxone 7/28>>>7/31>>>8/4>>>8/6  Vanco 8/1>>>8/2   HPI/Subjective: Patient alert and without complaints of chest pain or shortness of breath. He does endorse some minor numbness of left forearm but without any associated weakness or difficulty with gripping or holding items.  Objective: Blood  pressure 131/72, pulse 89, temperature 99.3 F (37.4 C), temperature source Oral, resp. rate 19, height 6' (1.829 m), weight 112.6 kg (248 lb 3.8 oz), SpO2 93.00%.  Intake/Output Summary (Last 24 hours) at 01/11/13 1254 Last data filed at 01/11/13 1100  Gross per 24 hour  Intake    820 ml  Output   2812 ml  Net  -1992 ml   Exam: General: No acute respiratory distress Lungs: Clear to auscultation bilaterally without wheezes or crackles, RA Cardiovascular: Regular rate and rhythm without murmur gallop or rub normal S1 and S2, no peripheral edema or JVD Abdomen: Nontender, nondistended, soft, bowel sounds positive, no rebound, no ascites, no appreciable mass Musculoskeletal: No significant cyanosis, clubbing of bilateral lower extremities Neurological: Alert and oriented x 3, moves all extremities x 4 without focal neurological deficits, CN 2-12 intact  Scheduled Meds: Scheduled Meds: . antiseptic oral rinse  15 mL Mouth Rinse TID WC & HS  . aspirin  325 mg Oral Daily  . calcium acetate  1,334 mg Oral TID WC  . carvedilol  6.25 mg Per NG tube BID WC  . cefTRIAXone (ROCEPHIN)  IV  2 g Intravenous Q24H  . darbepoetin (ARANESP) injection - NON-DIALYSIS  150 mcg Subcutaneous Q Mon-1800  . heparin subcutaneous  5,000 Units Subcutaneous Q8H  . hydrALAZINE  25 mg Oral Q8H  . insulin aspart  0-9 Units Subcutaneous TID AC & HS  . multivitamin with minerals  1 tablet Oral Daily  . nystatin   Topical TID  . nystatin-triamcinolone   Topical TID  . pantoprazole  40 mg Oral Daily  . simvastatin  20 mg Oral q1800  . sodium chloride  10-40 mL Intracatheter Q12H    Data Reviewed: Basic Metabolic Panel:  Recent Labs Lab 01/05/13 0500  01/06/13 0500  01/07/13 0400 01/07/13 1600 01/08/13 0320 01/09/13 0430 01/10/13 0545 01/11/13 0500  NA 141  < >  138  < > 135 135 133* 136 137 135  K 4.4  < > 4.3  < > 4.3 4.4 4.8 5.4* 5.1 4.7  CL 99  < > 99  < > 97 96 96 95* 96 95*  CO2 23  < > 23  < >  22 24 20  18* 21 23  GLUCOSE 96  < > 106*  < > 115* 113* 120* 101* 100* 107*  BUN 65*  < > 66*  < > 74* 74* 94* 144* 118* 81*  CREATININE 3.40*  < > 3.28*  < > 3.61* 3.67* 4.82* 8.95* 8.79* 7.46*  CALCIUM 8.7  < > 8.9  < > 8.7 8.5 8.3* 8.4 8.6 8.7  MG 3.1*  --  3.1*  --  3.0*  --  3.2*  --  3.2*  --   PHOS 4.0  < > 5.3*  < > 6.1* 7.0* 8.4* 13.2* 11.5* 9.4*  < > = values in this interval not displayed. Liver Function Tests:  Recent Labs Lab 01/05/13 0500  01/06/13 0500  01/07/13 1600 01/08/13 0320 01/09/13 0430 01/10/13 0545 01/11/13 0500  AST 62*  --  58*  --   --  61*  --   --   --   ALT 477*  --  321*  --   --  159*  --   --   --   ALKPHOS 242*  --  249*  --   --  180*  --   --   --   BILITOT 0.8  --  0.7  --   --  0.5  --   --   --   PROT 6.5  --  7.1  --   --  7.4  --   --   --   ALBUMIN 2.2*  < > 2.5*  < > 2.6* 2.5* 2.6* 2.5* 2.6*  < > = values in this interval not displayed. CBC:  Recent Labs Lab 01/07/13 0400 01/08/13 0320 01/09/13 0430 01/10/13 0545 01/11/13 0500  WBC 21.2* 20.0* 19.7* 20.9* 17.3*  NEUTROABS  --   --  15.2*  --   --   HGB 10.0* 9.8* 9.7* 9.5* 9.8*  HCT 31.1* 30.7* 29.4* 28.4* 29.4*  MCV 96.9 97.2 94.8 95.0 95.8  PLT 155 213 279 316 384   CBG:  Recent Labs Lab 01/10/13 0801 01/10/13 1656 01/10/13 2032 01/11/13 0736 01/11/13 1117  GLUCAP 99 152* 107* 108* 125*    Recent Results (from the past 240 hour(s))  CLOSTRIDIUM DIFFICILE BY PCR     Status: None   Collection Time    01/03/13  4:35 PM      Result Value Range Status   C difficile by pcr NEGATIVE  NEGATIVE Final  CULTURE, BAL-QUANTITATIVE     Status: None   Collection Time    01/04/13  3:35 PM      Result Value Range Status   Specimen Description BRONCHIAL ALVEOLAR LAVAGE   Final   Special Requests BRONCH WASH LL LOBE    Final   Gram Stain     Final   Value: RARE WBC PRESENT, PREDOMINANTLY PMN     NO SQUAMOUS EPITHELIAL CELLS SEEN     NO ORGANISMS SEEN   Colony Count  30,000 COLONIES/ML   Final   Culture     Final   Value: STAPHYLOCOCCUS AUREUS     Note: RIFAMPIN AND GENTAMICIN SHOULD NOT BE USED AS SINGLE DRUGS FOR TREATMENT OF STAPH  INFECTIONS.   Report Status 01/07/2013 FINAL   Final   Organism ID, Bacteria STAPHYLOCOCCUS AUREUS   Final     Studies:  Recent x-ray studies have been reviewed in detail by the Attending Physician    Junious Silk, ANP [facility] Office  [phone] Pager [phone]  **If unable to reach the above provider after paging please contact the Flow Manager @ [phone]  On-Call/Text Page:      [email or web address]      password [email or web address]  If 7PM-7AM, please contact night-coverage [email or web address] Password [email or web address] 01/11/2013, 12:54 PM   LOS: 13 days   I have personally examined this patient and reviewed the entire database. I have reviewed the above note, made any necessary editorial changes, and agree with its content.  Lonia Blood, MD [facility]

## 2013-01-11 NOTE — Progress Notes (Signed)
Physical Therapy Treatment Patient Details Name: Nicholas Potts MRN: [id number] DOB: January 09, 1961 Today's Date: 01/11/2013 Time: 0832-0900 PT Time Calculation (min): 28 min  PT Assessment / Plan / Recommendation  History of Present Illness 52 year old male for evaluation of shock. Note at the time of this evaluation the patient was intubated and had been coded 3 separate times. His history is obtained through discussions with critical care medicine and with his family. The patient had repair of his Achilles tendon 2 days prior to admission. He apparently had some difficulties with respiration postoperatively but otherwise did well. He has been taking pain medication as instructed based on his family's report. He was last seen lucid at 9 AM today. At approximately 11:30 AM his family found him unresponsive and "white foam coming from his mouth". He apparently was not complaining of dyspnea or chest pain since his surgery. EMS was called and he was brought to the emergency room where he suffered cardiac arrest 3 separate times. There is note of PEA arrest. I was called to perform an echocardiogram to assess his right heart with concern for pulmonary embolus. Upon my arrival the patient is in shock on 2 pressors and intubated and unresponsive.   PT Comments   Pt unable to maintain NWBing during this session despite max attempt to keep elevated off floor.  **As entering note, ortho MD wrote progress note & lifted WBing restrictions & pt is now WBAT in cam boot**   Follow Up Recommendations  SNF;Supervision/Assistance - 24 hour     Does the patient have the potential to tolerate intense rehabilitation     Barriers to Discharge        Equipment Recommendations  Rolling walker with 5" wheels    Recommendations for Other Services    Frequency Min 5X/week   Progress towards PT Goals Progress towards PT goals: Not progressing toward goals - comment  Plan Current plan remains appropriate     Precautions / Restrictions Precautions Precautions: Fall Restrictions RLE Weight Bearing: Non weight bearing   Pertinent Vitals/Pain C/o Lt thigh discomfort.      Mobility  Bed Mobility Bed Mobility: Supine to Sit;Sitting - Scoot to Edge of Bed Supine to Sit: 4: Min assist;HOB flat;With rails Details for Bed Mobility Assistance: incr. time.  cues for sequencing & technique.  Transfers Transfers: Sit to Stand;Stand to Sit;Stand Pivot Transfers Sit to Stand: 1: +2 Total assist;With upper extremity assist;With armrests;From bed;From chair/3-in-1 Sit to Stand: Patient Percentage: 30% Stand to Sit: 1: +2 Total assist;With upper extremity assist;With armrests;To chair/3-in-1 Stand to Sit: Patient Percentage: 40% Stand Pivot Transfers: 1: +2 Total assist Stand Pivot Transfers: Patient Percentage: 50% Details for Transfer Assistance: Pt cont's to not maintain NWBing RLE despite max cues.  Pt states LE is too heavy to hold off floor.   Ambulation/Gait Ambulation/Gait Assistance: Not tested (comment) Ambulation/Gait Assistance Details: Not tested due to pt unable to maintain NWBing      PT Goals (current goals can now be found in the care plan section) Acute Rehab PT Goals PT Goal Formulation: With patient/family Time For Goal Achievement: 01/23/13 Potential to Achieve Goals: Good  Visit Information  Last PT Received On: 01/11/13 Assistance Needed: +2 History of Present Illness: 52 year old male for evaluation of shock. Note at the time of this evaluation the patient was intubated and had been coded 3 separate times. His history is obtained through discussions with critical care medicine and with his family. The patient had repair of his Achilles  tendon 2 days prior to admission. He apparently had some difficulties with respiration postoperatively but otherwise did well. He has been taking pain medication as instructed based on his family's report. He was last seen lucid at 9 AM today. At  approximately 11:30 AM his family found him unresponsive and "white foam coming from his mouth". He apparently was not complaining of dyspnea or chest pain since his surgery. EMS was called and he was brought to the emergency room where he suffered cardiac arrest 3 separate times. There is note of PEA arrest. I was called to perform an echocardiogram to assess his right heart with concern for pulmonary embolus. Upon my arrival the patient is in shock on 2 pressors and intubated and unresponsive.    Subjective Data      Cognition  Cognition Arousal/Alertness: Awake/alert Behavior During Therapy: WFL for tasks assessed/performed Overall Cognitive Status: Impaired/Different from baseline Area of Impairment: Problem solving;Following commands Following Commands: Follows one step commands with increased time Problem Solving: Slow processing;Requires verbal cues;Requires tactile cues General Comments: Pt slow to respond & process commands.  [name] & daughter state they have noticed decreased processing for pt    Balance     End of Session PT - End of Session Equipment Utilized During Treatment: Gait belt Patient left: in chair;with call bell/phone within reach;with family/visitor present Nurse Communication: Mobility status   GP     Nicholas Potts 01/11/2013, 3:10 PM   Nicholas Potts, PTA [phone] 01/11/2013

## 2013-01-12 DIAGNOSIS — J690 Pneumonitis due to inhalation of food and vomit: Secondary | ICD-10-CM

## 2013-01-12 DIAGNOSIS — J9600 Acute respiratory failure, unspecified whether with hypoxia or hypercapnia: Secondary | ICD-10-CM

## 2013-01-12 DIAGNOSIS — T400X1A Poisoning by opium, accidental (unintentional), initial encounter: Secondary | ICD-10-CM

## 2013-01-12 DIAGNOSIS — I5021 Acute systolic (congestive) heart failure: Secondary | ICD-10-CM

## 2013-01-12 DIAGNOSIS — R5381 Other malaise: Secondary | ICD-10-CM

## 2013-01-12 DIAGNOSIS — G931 Anoxic brain damage, not elsewhere classified: Secondary | ICD-10-CM

## 2013-01-12 DIAGNOSIS — N179 Acute kidney failure, unspecified: Secondary | ICD-10-CM

## 2013-01-12 LAB — RENAL FUNCTION PANEL
Albumin: 2.8 g/dL — ABNORMAL LOW (ref 3.5–5.2)
BUN: 107 mg/dL — ABNORMAL HIGH (ref 6–23)
CO2: 21 mEq/L (ref 19–32)
Chloride: 91 mEq/L — ABNORMAL LOW (ref 96–112)
GFR calc non Af Amer: 5 mL/min — ABNORMAL LOW (ref >90)
Potassium: 5.3 mEq/L — ABNORMAL HIGH (ref 3.5–5.1)

## 2013-01-12 LAB — GLUCOSE, CAPILLARY
Glucose-Capillary: 94 mg/dL (ref 70–99)
Glucose-Capillary: 196 mg/dL — ABNORMAL HIGH (ref 70–99)

## 2013-01-12 LAB — HEPATITIS B CORE ANTIBODY, TOTAL: Hep B Core Total Ab: POSITIVE — AB

## 2013-01-12 MED ORDER — HEPARIN SODIUM (PORCINE) 1000 UNIT/ML DIALYSIS
20.0000 [IU]/kg | INTRAMUSCULAR | Status: DC | PRN
  Administered 2013-01-12: 2300 [IU] via INTRAVENOUS_CENTRAL

## 2013-01-12 MED ORDER — LANTHANUM CARBONATE 500 MG PO CHEW
1000.0000 mg | CHEWABLE_TABLET | Freq: Three times a day (TID) | ORAL | Status: DC
  Administered 2013-01-12 – 2013-01-27 (×42): 1000 mg via ORAL
  Filled 2013-01-12 (×48): qty 2

## 2013-01-12 NOTE — Progress Notes (Signed)
Rehab Admissions Coordinator Note:  Patient was screened by Brock Ra for appropriateness for an Inpatient Acute Rehab Consult.  At this time, we are recommending Inpatient Rehab consult.  Brock Ra 01/12/2013, 12:15 PM  I can be reached at [phone].

## 2013-01-12 NOTE — Progress Notes (Signed)
37M w/ acute systolic HF / cardiogenic shock and AKI 2/2 ATN, req RRT after R achilles repair 7/20 and cardiac arrest with VTach and resp failure req Impella device.   Subjective:  No new events  08/06 0701 - 08/07 0700 In: 710 [P.O.:660; IV Piggyback:50] Out: -   Current meds: reviewed Current Labs: reviewed   Physical Exam:  Blood pressure 108/73, pulse 83, temperature 98.2 F (36.8 C), temperature source Oral, resp. rate 20, height 6' (1.829 m), weight 112.81 kg (248 lb 11.2 oz), SpO2 99.00%. NAD, lying in bed RRR CTAB Obese, soft, nontender nonfocal 1+ LEE  Assessment/Plan 1. AKI 2/2 ATN [unclear baseline] / cardiogenic shock, arrest, and contrast nephropathy: Started HD 7/25. HD today. Remains anuric. Will keep on THS until any sign of renal recovery.  2. Cardiogenic shock; per cards, improved  3. Hypoxic RF; resolved  4. Anemia; on aranesp, follow  5. MSSA on BAL  6. Hyperphosphatemia; Binders started 8/4; PHos is 10.4 today.  Add Lanthanum at least for a temporary time.    Sabra Heck MD 01/12/2013, 11:16 AM   Recent Labs Lab 01/10/13 0545 01/11/13 0500 01/12/13 0502  NA 137 135 132*  K 5.1 4.7 5.3*  CL 96 95* 91*  CO2 21 23 21   GLUCOSE 100* 107* 90  BUN 118* 81* 107*  CREATININE 8.79* 7.46* 10.50*  CALCIUM 8.6 8.7 8.8  PHOS 11.5* 9.4* 10.4*    Recent Labs Lab 01/09/13 0430 01/10/13 0545 01/11/13 0500  WBC 19.7* 20.9* 17.3*  NEUTROABS 15.2*  --   --   HGB 9.7* 9.5* 9.8*  HCT 29.4* 28.4* 29.4*  MCV 94.8 95.0 95.8  PLT 279 316 384    Current Facility-Administered Medications  Medication Dose Route Frequency Provider Last Rate Last Dose  . 0.9 %  sodium chloride infusion  250 mL Intravenous PRN Jeanella Craze, NP 0 mL/hr at 01/02/13 1700    . 0.9 %  sodium chloride infusion   Intravenous Continuous Alyson Reedy, MD      . 0.9 %  sodium chloride infusion   Intravenous Continuous Oretha Milch, MD      . antiseptic oral rinse (BIOTENE) solution  15 mL  15 mL Mouth Rinse TID WC & HS Alyson Reedy, MD   15 mL at 01/11/13 1703  . aspirin tablet 325 mg  325 mg Oral Daily Chuck Hint, MD   325 mg at 01/12/13 1033  . calcium acetate (PHOSLO) capsule 1,334 mg  1,334 mg Oral TID WC Arita Miss, MD   1,334 mg at 01/12/13 0803  . carvedilol (COREG) tablet 6.25 mg  6.25 mg Per NG tube BID WC Tonny Bollman, MD   6.25 mg at 01/11/13 1705  . darbepoetin (ARANESP) injection 150 mcg  150 mcg Subcutaneous Q Mon-1800 Sadie Haber, MD   150 mcg at 01/09/13 1558  . feeding supplement (NEPRO CARB STEADY) liquid 237 mL  237 mL Oral PRN Arita Miss, MD      . fentaNYL (SUBLIMAZE) injection 25-100 mcg  25-100 mcg Intravenous Q2H PRN Coralyn Helling, MD   25 mcg at 01/09/13 2017  . heparin injection 1,000 Units  1,000 Units Dialysis PRN Arita Miss, MD      . heparin injection 2,300 Units  20 Units/kg Dialysis PRN Arita Miss, MD   2,300 Units at 01/10/13 1038  . heparin injection 5,000 Units  5,000 Units Subcutaneous Q8H Nelda Bucks, MD   5,000  Units at 01/12/13 0657  . hydrALAZINE (APRESOLINE) injection 10 mg  10 mg Intravenous Q4H PRN Coralyn Helling, MD      . hydrALAZINE (APRESOLINE) tablet 25 mg  25 mg Oral Q8H Lewayne Bunting, MD   25 mg at 01/12/13 0657  . insulin aspart (novoLOG) injection 0-9 Units  0-9 Units Subcutaneous TID AC & HS Alyson Reedy, MD   1 Units at 01/11/13 1709  . lidocaine (PF) (XYLOCAINE) 1 % injection 5 mL  5 mL Intradermal PRN Arita Miss, MD      . lidocaine-prilocaine (EMLA) cream 1 application  1 application Topical PRN Arita Miss, MD      . metoprolol (LOPRESSOR) injection 5 mg  5 mg Intravenous Q4H PRN Coralyn Helling, MD      . multivitamin with minerals tablet 1 tablet  1 tablet Oral Daily Alyson Reedy, MD   1 tablet at 01/12/13 1033  . nystatin (MYCOSTATIN/NYSTOP) topical powder   Topical TID Zigmund Gottron, MD      . nystatin-triamcinolone [facility] II) cream   Topical TID Zigmund Gottron, MD      . ondansetron [facility]) injection 4 mg  4 mg Intravenous Q6H PRN Tonny Bollman, MD      . oxyCODONE (Oxy IR/ROXICODONE) immediate release tablet 10 mg  10 mg Oral Q4H PRN Chuck Hint, MD      . pantoprazole (PROTONIX) EC tablet 40 mg  40 mg Oral Daily Alyson Reedy, MD   40 mg at 01/12/13 1033  . pentafluoroprop-tetrafluoroeth (GEBAUERS) aerosol 1 application  1 application Topical PRN Arita Miss, MD      . simvastatin (ZOCOR) tablet 20 mg  20 mg Oral q1800 Chuck Hint, MD   20 mg at 01/11/13 1709  . sodium chloride 0.9 % injection 10-40 mL  10-40 mL Intracatheter Q12H Alyson Reedy, MD   10 mL at 01/12/13 1034  . sodium chloride 0.9 % injection 10-40 mL  10-40 mL Intracatheter PRN Alyson Reedy, MD      . traMADol Janean Sark) tablet 50 mg  50 mg Oral Daily PRN Chuck Hint, MD

## 2013-01-12 NOTE — Procedures (Signed)
Patient was seen on dialysis and the procedure was supervised.  BFR 400  Via cath BP is  123/68.   Patient appears to be tolerating treatment well  Gisella Alwine A 01/12/2013

## 2013-01-12 NOTE — Progress Notes (Signed)
Physical Therapy Treatment Patient Details Name: Nicholas Potts MRN: [id number] DOB: 1961-02-26 Today's Date: 01/12/2013 Time: 7829-5621 PT Time Calculation (min): 23 min  PT Assessment / Plan / Recommendation  History of Present Illness 52 year old male for evaluation of shock. The patient was intubated and had been coded 3 separate times. The patient had repair of his Achilles tendon 2 days prior to admission. He apparently had some difficulties with respiration postoperatively but otherwise did well. He has been taking pain medication as instructed based on his family's report. He was last seen lucid at 9 AM and at approximately 11:30 AM his family found him unresponsive and "white foam coming from his mouth". EMS was called and he was brought to the emergency room where he suffered cardiac arrest 3 separate times. There is note of PEA arrest.   PT Comments   Pt much improved now that he is WBAT in [employer].  Pt fatigues easily, but is better able to participate in mobility.    Follow Up Recommendations  CIR     Does the patient have the potential to tolerate intense rehabilitation     Barriers to Discharge        Equipment Recommendations  Rolling walker with 5" wheels    Recommendations for Other Services Rehab consult  Frequency Min 5X/week   Progress towards PT Goals Progress towards PT goals: Progressing toward goals  Plan Current plan remains appropriate    Precautions / Restrictions Precautions Precautions: Fall Restrictions Weight Bearing Restrictions: Yes RLE Weight Bearing: Weight bearing as tolerated Other Position/Activity Restrictions: Must wear Cam boot for WBAT.     Pertinent Vitals/Pain Indicates pain at achilles only during ambulation.      Mobility  Bed Mobility Bed Mobility: Supine to Sit;Sitting - Scoot to Edge of Bed Supine to Sit: 4: Min assist;HOB flat;With rails Sitting - Scoot to Edge of Bed: 5: Supervision Details for Bed Mobility  Assistance: Needs increased time.   Transfers Transfers: Sit to Stand;Stand to Sit Sit to Stand: 4: Min assist;With upper extremity assist;From bed Stand to Sit: 4: Min assist;With upper extremity assist;To chair/3-in-1;With armrests Details for Transfer Assistance: Cues for UE use and controlling descent to chair.   Ambulation/Gait Ambulation/Gait Assistance: 4: Min assist Ambulation Distance (Feet): 15 Feet Assistive device: Rolling walker Ambulation/Gait Assistance Details: cues for positioning within RW Gait Pattern: Step-to pattern;Decreased step length - right;Decreased stance time - left;Trunk flexed Stairs: No Wheelchair Mobility Wheelchair Mobility: No    Exercises     PT Diagnosis:    PT Problem List:   PT Treatment Interventions:     PT Goals (current goals can now be found in the care plan section) Acute Rehab PT Goals PT Goal Formulation: With patient/family Time For Goal Achievement: 01/23/13 Potential to Achieve Goals: Good  Visit Information  Last PT Received On: 01/12/13 Assistance Needed: +2 (helpful for chair follow) History of Present Illness: 52 year old male for evaluation of shock. The patient was intubated and had been coded 3 separate times. The patient had repair of his Achilles tendon 2 days prior to admission. He apparently had some difficulties with respiration postoperatively but otherwise did well. He has been taking pain medication as instructed based on his family's report. He was last seen lucid at 9 AM and at approximately 11:30 AM his family found him unresponsive and "white foam coming from his mouth". EMS was called and he was brought to the emergency room where he suffered cardiac arrest 3 separate times. There  is note of PEA arrest.    Subjective Data      Cognition  Cognition Arousal/Alertness: Awake/alert Behavior During Therapy: WFL for tasks assessed/performed Overall Cognitive Status: Impaired/Different from baseline Area of  Impairment: Problem solving;Following commands Following Commands: Follows one step commands with increased time Problem Solving: Slow processing;Requires verbal cues;Requires tactile cues General Comments: Pt slow to respond & process commands.  [name] & daughter state they have noticed decreased processing for pt    Balance  Balance Balance Assessed: Yes Static Standing Balance Static Standing - Balance Support: Bilateral upper extremity supported Static Standing - Level of Assistance: 5: Stand by assistance Static Standing - Comment/# of Minutes: pt able to remain standing for Nsg Tech to check blood sugars.    End of Session PT - End of Session Equipment Utilized During Treatment: Gait belt (Cam Boot) Activity Tolerance: Patient tolerated treatment well Patient left: in chair;with call bell/phone within reach;with family/visitor present Nurse Communication: Mobility status   GP     Sunny Schlein, [place] [phone] 01/12/2013, 11:51 AM

## 2013-01-12 NOTE — Consult Note (Signed)
Physical Medicine and Rehabilitation Consult Reason for Consult: Deconditioning/multi-medical Referring Physician: [facility]   HPI: Nicholas Potts is a 52 y.o. male with history of recently undergoing right Achilles tendon reconstruction on 12/25/2012 by Dr. Victorino Dike in the outpatient setting. Presented to [facility] 12/29/2012 with altered mental status suspected related to unintentional mycotic overdose. EMS arrived to the patient's home to find him apneic and he was immediately intubated. ABG is consistent with respiratory acidosis. In the emergency department patient became hypotensive with systolic blood pressure in the 60s and he subsequently developed profound bradycardia and became pulseless requiring CPR. Patient noted to have ventricular tachycardia during resuscitation effort required defibrillation with amiodarone. Cranial CT scan showed no acute intracranial abnormalities. Cardiology services followup for cardiogenic shock/acute systolic congestive heart failure. Echocardiogram with ejection fraction of 20%. Cardiac catheterization per Dr. Excell Seltzer showed severe cardiogenic shock with severe left ventricular systolic dysfunction. Hospital course complicated by acute renal failure with tubular necrosis elevate creatinine 7.46 followup renal services requiring hemodialysis. Subcutaneous heparin added for DVT prophylaxis. Patient is weightbearing as tolerated right lower extremity with Cam boot after recent Achilles tendon reconstruction. Physical therapy evaluation completed an ongoing with recommendations of physical medicine and rehabilitation consult to consider inpatient rehabilitation services.  Patient returned from dialysis. Family is visiting. Son is interpreting Review of Systems  Respiratory: Positive for shortness of breath.   Musculoskeletal: Positive for myalgias and joint pain.  All other systems reviewed and are negative.   Past Medical History  Diagnosis Date  .  Hyperlipidemia   . OSA (obstructive sleep apnea)   . CVA (cerebral infarction)    Past Surgical History  Procedure Laterality Date  . Achilles tendon repair    . Insertion of dialysis catheter Right 01/09/2013    Procedure: INSERTION OF DIALYSIS CATHETER;  Surgeon: Chuck Hint, MD;  Location: [facility] OR;  Service: Vascular;  Laterality: Right;   No family history on file. Social History:  reports that he has quit smoking. He has never used smokeless tobacco. He reports that  drinks alcohol. He reports that he uses illicit drugs. Allergies: No Known Allergies Medications Prior to Admission  Medication Sig Dispense Refill  . aspirin 325 MG tablet Take 325 mg by mouth daily.      . meloxicam (MOBIC) 15 MG tablet Take 15 mg by mouth daily.      . Multiple Vitamins-Minerals (ONE-A-DAY 50 PLUS) TABS Take 1 tablet by mouth daily.      Marland Kitchen oxycodone (OXY-IR) 5 MG capsule Take 10 mg by mouth every 4 (four) hours as needed for pain.      . simvastatin (ZOCOR) 20 MG tablet Take 20 mg by mouth daily.      . traMADol (ULTRAM) 50 MG tablet Take 50 mg by mouth daily.        Home: Home Living Family/patient expects to be discharged to:: Private residence Living Arrangements: Children;Spouse/significant other Available Help at Discharge: Family;Available 24 hours/day Type of Home: House Home Access: Stairs to enter [employer] of Steps: 3 Entrance Stairs-Rails: Right Home Layout: One level Home Equipment: Crutches  Functional History:   Functional Status:  Mobility: Bed Mobility Bed Mobility: Supine to Sit;Sitting - Scoot to Edge of Bed Supine to Sit: 4: Min assist;HOB flat;With rails Sitting - Scoot to Edge of Bed: 5: Supervision Transfers Transfers: Sit to Stand;Stand to Sit Sit to Stand: 4: Min assist;With upper extremity assist;From bed Sit to Stand: Patient Percentage: 30% Stand to Sit: 4: Min assist;With  upper extremity assist;To chair/3-in-1;With armrests Stand to  Sit: Patient Percentage: 40% Stand Pivot Transfers: 1: +2 Total assist Stand Pivot Transfers: Patient Percentage: 50% Ambulation/Gait Ambulation/Gait Assistance: 4: Min assist Ambulation Distance (Feet): 15 Feet Assistive device: Rolling walker Ambulation/Gait Assistance Details: cues for positioning within RW Gait Pattern: Step-to pattern;Decreased step length - right;Decreased stance time - left;Trunk flexed Stairs: No Wheelchair Mobility Wheelchair Mobility: No  ADL:    Cognition: Cognition Overall Cognitive Status: Impaired/Different from baseline Orientation Level: Oriented X4 Cognition Arousal/Alertness: Awake/alert Behavior During Therapy: WFL for tasks assessed/performed Overall Cognitive Status: Impaired/Different from baseline Area of Impairment: Problem solving;Following commands Following Commands: Follows one step commands with increased time Problem Solving: Slow processing;Requires verbal cues;Requires tactile cues General Comments: Pt slow to respond & process commands.  [name] & daughter state they have noticed decreased processing for pt  Blood pressure 108/73, pulse 83, temperature 98.2 F (36.8 C), temperature source Oral, resp. rate 20, height 6' (1.829 m), weight 112.81 kg (248 lb 11.2 oz), SpO2 99.00%. Physical Exam  HENT:  Head: Normocephalic.  Eyes: EOM are normal.  Neck: Normal range of motion. Neck supple. No thyromegaly present.  Cardiovascular: Normal rate and regular rhythm.   Pulmonary/Chest: Effort normal and breath sounds normal. No respiratory distress.  Abdominal: Soft. Bowel sounds are normal. He exhibits no distension.  Neurological: He is alert.  Patient speaks very little [place]. He follows demonstrate commands. He answered questions appropriately in Spanish for his son  Skin:  CAM boot in place to right lower extremity   motor strength is 5/5 in bilateral deltoid, bicep, tricep, grip, 4/5 in bilateral hip flexors, knee extensors,  ankle dorsiflexors and plantar flexors Supine to sit requires Max assist Difficulty with rolling. Complaints of fatigue after dialysis. Sensation intact to light touch in both upper and lower limbs Results for orders placed during the hospital encounter of 12/29/12 (from the past 24 hour(s))  GLUCOSE, CAPILLARY     Status: Abnormal   Collection Time    01/11/13  4:24 PM      Result Value Range   Glucose-Capillary 132 (*) 70 - 99 mg/dL  GLUCOSE, CAPILLARY     Status: Abnormal   Collection Time    01/11/13 10:51 PM      Result Value Range   Glucose-Capillary 128 (*) 70 - 99 mg/dL   Comment 1 Documented in Chart     Comment 2 Notify RN    RENAL FUNCTION PANEL     Status: Abnormal   Collection Time    01/12/13  5:02 AM      Result Value Range   Sodium 132 (*) 135 - 145 mEq/L   Potassium 5.3 (*) 3.5 - 5.1 mEq/L   Chloride 91 (*) 96 - 112 mEq/L   CO2 21  19 - 32 mEq/L   Glucose, Bld 90  70 - 99 mg/dL   BUN 161 (*) 6 - 23 mg/dL   Creatinine, Ser 09.60 (*) 0.50 - 1.35 mg/dL   Calcium 8.8  8.4 - 45.4 mg/dL   Phosphorus 09.8 (*) 2.3 - 4.6 mg/dL   Albumin 2.8 (*) 3.5 - 5.2 g/dL   GFR calc non Af Amer 5 (*) >90 mL/min   GFR calc Af Amer 6 (*) >90 mL/min  GLUCOSE, CAPILLARY     Status: None   Collection Time    01/12/13  7:29 AM      Result Value Range   Glucose-Capillary 88  70 - 99 mg/dL  GLUCOSE,  CAPILLARY     Status: Abnormal   Collection Time    01/12/13 11:28 AM      Result Value Range   Glucose-Capillary 128 (*) 70 - 99 mg/dL   Dg Chest Port 1 View  01/11/2013   *RADIOLOGY REPORT*  Clinical Data: Shortness of breath.  Pneumonia.  PORTABLE CHEST - 1 VIEW  Comparison: Plain film chest 01/08/2013 and 01/09/2013.  Findings: Dialysis catheter remains in place.  Lung volumes are low.  Mild left basilar atelectasis is identified.  There is no pulmonary edema.  No pneumothorax or pleural fluid is identified. Heart size is enlarged.  IMPRESSION:  Mild subsegmental atelectasis left lung  base.  No acute abnormality.   Original Report Authenticated By: Holley Dexter, M.D.    Assessment/Plan: 1. Diagnosis: Deconditioning after respiratory failure leading to cardiogenic shockDoes the need for close, 24 hr/day medical supervision in concert with the patient's rehab needs make it unreasonable for this patient to be served in a less intensive setting? Yes 2. Co-Morbidities requiring supervision/potential complications: Acute renal failure requiring hemodialysis, recent aspiration pneumonia 3. Due to bladder management, bowel management, safety, skin/wound care, disease management, medication administration, pain management and patient education, does the patient require 24 hr/day rehab nursing? Yes 4. Does the patient require coordinated care of a physician, rehab nurse, PT (1-2 hrs/day, 5 days/week), OT (1-2 hrs/day, 5 days/week) and SLP (0.5-1 hrs/day, 5 days/week) to address physical and functional deficits in the context of the above medical diagnosis(es)? Yes and Potentially Addressing deficits in the following areas: balance, endurance, locomotion, strength, transferring, bowel/bladder control, bathing, dressing, feeding, grooming, toileting and cognition 5. Can the patient actively participate in an intensive therapy program of at least 3 hrs of therapy per day at least 5 days per week? Yes 6. The potential for patient to make measurable gains while on inpatient rehab is good 7. Anticipated functional outcomes upon discharge from inpatient rehab are supervision to modified independent mobility with PT, supervision to modified independent ADLs with OT, assess cognition with SLP. 8. Estimated rehab length of stay to reach the above functional goals is: 7-10 days 9. Does the patient have adequate social supports to accommodate these discharge functional goals? Yes 10. Anticipated D/C setting: Home 11. Anticipated post D/C treatments: HH therapy 12. Overall Rehab/Functional  Prognosis: good  RECOMMENDATIONS: This patient's condition is appropriate for continued rehabilitative care in the following setting: CIR Patient has agreed to participate in recommended program. Yes Note that insurance prior authorization may be required for reimbursement for recommended care.  Comment: Ready for CIR from a PM and R. standpoint    01/12/2013

## 2013-01-12 NOTE — Progress Notes (Signed)
Speech Language Pathology Dysphagia Treatment Patient Details Name: Nicholas Potts MRN: [id number] DOB: February 02, 1961 Today's Date: 01/12/2013 Time: 1219-1227 SLP Time Calculation (min): 8 min  Assessment / Plan / Recommendation Clinical Impression  Pt tolerating regular consistency diet with thin liquids with improvements.  No s/s of aspiration; able to use straw without difficulty.  Dysphagia appears to have resolved - SLP to sign-off.  Pt/son agree with plan.    Diet Recommendation  Continue with Current Diet: Regular;Thin liquid    SLP Plan All goals met      Swallowing Goals  SLP Swallowing Goals Patient will consume recommended diet without observed clinical signs of aspiration with: Minimal assistance Swallow Study Goal #1 - Progress: Met Patient will utilize recommended strategies during swallow to increase swallowing safety with: Minimal assistance Swallow Study Goal #2 - Progress: Met at mod i level  General Temperature Spikes Noted: No Respiratory Status: Supplemental O2 delivered via (comment) Behavior/Cognition: Alert;Cooperative;Pleasant mood Oral Cavity - Dentition: Adequate natural dentition Patient Positioning: Upright in bed  Oral Cavity - Oral Hygiene     Dysphagia Treatment Treatment focused on: Skilled observation of diet tolerance;Patient/family/caregiver Dealer Educated: Son Treatment Methods/Modalities: Skilled observation Patient observed directly with PO's: Yes Type of PO's observed: Regular;Thin liquids Feeding: Able to feed self Liquids provided via: Cup;Straw Type of cueing: Verbal Amount of cueing:  (mod i)   GO     Carolan Shiver 01/12/2013, 12:33 PM

## 2013-01-12 NOTE — Progress Notes (Signed)
[facility] PROGRESS NOTE  Nicholas Potts JYN:[id number] DOB: 05-19-1961 DOA: 12/29/2012 PCP: No primary provider on file.    Brief narrative:  Please refer to detailed  narrative from 8/6., in brief 52 year old hispanic male with recent  right Achilles tendon reconstruction was brought in for apnea due to  Unintentional narcotic overdose.  Patient was found to have respiratory acidosis and hypothermia requiring intubation in the ER. Subsequent course complicated by hypotension, profound bradycardia and became pulseless requiring CPR. Patient then went into V. tach requiring defibrillation and amiodarone. He was stabilized and transferred to ICU. Emergent echo done showed severe wall motion abnormality. He subsequently coded 2 more times and taken to cath lab immediately. Patient found to have severe cardiogenic shock and severely in the dysfunction with EF of 20%. Patient was placed on the impella dynamic support system along with pressors . Patient also developed oliguric renal failure related to shock for which a hemodialysis catheter was placed and CVVHD initiated. Patient had severe encephalopathy secondary to acute insult. Neurology was consulted and had an EEG done which showed moderate to severe generalized nonspecific slowing. Patient's mental status slowly started to improve and after successful extubation he was transferred to step down. Patient now transferred to medical floor for further management.  Assessment/Plan:  Cardiac and respiratory arrest with ventricular tachycardia Likely due to Accidental opiate overdose with Aspiration pneumonia  -Patient required ventilatory initially now successfully extubated and stable. -Patient has been empirically treated with 7 days of antibiotics for pneumonia. Cultures negative  Cardiogenic shock -required Impella for circulatory support initially  -EF 20% per cath. -nonobstructive CAD noted as well. -Impella device removed 7/26   -Deep to the echo done on 8/6 showing improvement in EF of 55-60% -cont Coreg, Apresoline and Zocor   ARF (acute renal failure)  -Likely ATN with circulatory shock -Patient initiated on CVVHD and now getting hemodialysis. Currently anuric  -hopeful for full renal recovery. Renal following closely.   HTN  -Continue hydralazine. Isodril discontinued by cardiology given low-normal blood pressure  Anemia and thrombocytopenia  related to acute shock. Getting Aranesp with dialysis. Cytopenia has resolved   Recent right Achilles tendon repair   -Seen by Dr. Victorino Dike on 8/6. Sutures removed from right CM cath was recommends weight reading on the right foot as tolerated with heel lift  in place  DVT prophylaxis: Subcutaneous heparin  Code Status: Full  Family Communication: Patient's son and daughter at Bedside Who Will Translate for the Patient   Disposition Plan; pending duration of HD ( transient vs longterm if patient needs CLIP). Eventually needs SNF  Consultants:  Cardiology  Nephrology  Neurology  Vascular surgery  Orthopedic surgery   Procedures:  Left heart catheterization 12/30/2012  1. Severe cardiogenic shock with severe left ventricular systolic dysfunction  2. Mild nonobstructive coronary artery disease with slow coronary flow  EEG 01/02/2013  abnormal with moderately severe generalized nonspecific slowing of cerebral activity, which can be seen with a wide variety of encephalopathic processes. No epileptic activity was recorded.  Removal of right IJ temporary HD catheter and Ultrasound guided placement of right IJ Diatek catheter 01/09/2013   CULTURES:  Sputum 7/25>>>non-pathogenic oropharyngeal-type flora insolated (rare WBC present, mostly PMN)  BCx2 7/24>>>ng  UC 7/24>>>ng  C. Diff 7/29>>>neg  Bronch bal 7/30>>>rare WBC, mostly PMN>>>Staph aureus   Antibiotics:  Vanco 7/24>>>>7/28  Zosyn 7/2/4>>>7/28  Ceftriaxone 7/28>>>7/31>>>8/4>>>8/6  Vanco 8/1>>>8/2    HPI/Subjective:  Patient seen and examined this morning. Complains of some left thigh  pain. No overnight issues.   Objective: Filed Vitals:   01/12/13 1500  BP: 122/67  Pulse: 82  Temp:   Resp:     Intake/Output Summary (Last 24 hours) at 01/12/13 1531 Last data filed at 01/12/13 0900  Gross per 24 hour  Intake    540 ml  Output      0 ml  Net    540 ml   Filed Weights   01/11/13 0455 01/11/13 2255 01/12/13 1303  Weight: 112.6 kg (248 lb 3.8 oz) 112.81 kg (248 lb 11.2 oz) 113 kg (249 lb 1.9 oz)    Exam:   General: Middle-aged male lying in bed in no acute distress  Chest: Clear to auscultation bilaterally, no added sounds, right dialysis catheter  Cardiovascular: Normal S1 and S2, no murmurs rub or gallop  Abdomen: Soft, nontender, nondistended, bowel sounds present, 14 place  Extremities: Warm, no edema, sutures over lift activities and then removed  CNS: AAO x3, nonfocal   Data Reviewed: Basic Metabolic Panel:  Recent Labs Lab 01/06/13 0500  01/07/13 0400  01/08/13 0320 01/09/13 0430 01/10/13 0545 01/11/13 0500 01/12/13 0502  NA 138  < > 135  < > 133* 136 137 135 132*  K 4.3  < > 4.3  < > 4.8 5.4* 5.1 4.7 5.3*  CL 99  < > 97  < > 96 95* 96 95* 91*  CO2 23  < > 22  < > 20 18* 21 23 21   GLUCOSE 106*  < > 115*  < > 120* 101* 100* 107* 90  BUN 66*  < > 74*  < > 94* 144* 118* 81* 107*  CREATININE 3.28*  < > 3.61*  < > 4.82* 8.95* 8.79* 7.46* 10.50*  CALCIUM 8.9  < > 8.7  < > 8.3* 8.4 8.6 8.7 8.8  MG 3.1*  --  3.0*  --  3.2*  --  3.2*  --   --   PHOS 5.3*  < > 6.1*  < > 8.4* 13.2* 11.5* 9.4* 10.4*  < > = values in this interval not displayed. Liver Function Tests:  Recent Labs Lab 01/06/13 0500  01/08/13 0320 01/09/13 0430 01/10/13 0545 01/11/13 0500 01/12/13 0502  AST 58*  --  61*  --   --   --   --   ALT 321*  --  159*  --   --   --   --   ALKPHOS 249*  --  180*  --   --   --   --   BILITOT 0.7  --  0.5  --   --   --   --   PROT 7.1  --   7.4  --   --   --   --   ALBUMIN 2.5*  < > 2.5* 2.6* 2.5* 2.6* 2.8*  < > = values in this interval not displayed. No results found for this basename: LIPASE, AMYLASE,  in the last 168 hours No results found for this basename: AMMONIA,  in the last 168 hours CBC:  Recent Labs Lab 01/07/13 0400 01/08/13 0320 01/09/13 0430 01/10/13 0545 01/11/13 0500  WBC 21.2* 20.0* 19.7* 20.9* 17.3*  NEUTROABS  --   --  15.2*  --   --   HGB 10.0* 9.8* 9.7* 9.5* 9.8*  HCT 31.1* 30.7* 29.4* 28.4* 29.4*  MCV 96.9 97.2 94.8 95.0 95.8  PLT 155 213 279 316 384   Cardiac Enzymes: No results found for this basename:  CKTOTAL, CKMB, CKMBINDEX, TROPONINI,  in the last 168 hours BNP (last 3 results)  Recent Labs  12/30/12 0500  PROBNP 3296.0*   CBG:  Recent Labs Lab 01/11/13 1117 01/11/13 1624 01/11/13 2251 01/12/13 0729 01/12/13 1128  GLUCAP 125* 132* 128* 88 128*    Recent Results (from the past 240 hour(s))  CLOSTRIDIUM DIFFICILE BY PCR     Status: None   Collection Time    01/03/13  4:35 PM      Result Value Range Status   C difficile by pcr NEGATIVE  NEGATIVE Final  CULTURE, BAL-QUANTITATIVE     Status: None   Collection Time    01/04/13  3:35 PM      Result Value Range Status   Specimen Description BRONCHIAL ALVEOLAR LAVAGE   Final   Special Requests BRONCH WASH LL LOBE    Final   Gram Stain     Final   Value: RARE WBC PRESENT, PREDOMINANTLY PMN     NO SQUAMOUS EPITHELIAL CELLS SEEN     NO ORGANISMS SEEN   Colony Count 30,000 COLONIES/ML   Final   Culture     Final   Value: STAPHYLOCOCCUS AUREUS     Note: RIFAMPIN AND GENTAMICIN SHOULD NOT BE USED AS SINGLE DRUGS FOR TREATMENT OF STAPH INFECTIONS.   Report Status 01/07/2013 FINAL   Final   Organism ID, Bacteria STAPHYLOCOCCUS AUREUS   Final     Studies: Dg Chest Port 1 View  01/11/2013   *RADIOLOGY REPORT*  Clinical Data: Shortness of breath.  Pneumonia.  PORTABLE CHEST - 1 VIEW  Comparison: Plain film chest 01/08/2013 and  01/09/2013.  Findings: Dialysis catheter remains in place.  Lung volumes are low.  Mild left basilar atelectasis is identified.  There is no pulmonary edema.  No pneumothorax or pleural fluid is identified. Heart size is enlarged.  IMPRESSION:  Mild subsegmental atelectasis left lung base.  No acute abnormality.   Original Report Authenticated By: Holley Dexter, M.D.    Scheduled Meds: . antiseptic oral rinse  15 mL Mouth Rinse TID WC & HS  . aspirin  325 mg Oral Daily  . calcium acetate  1,334 mg Oral TID WC  . carvedilol  6.25 mg Per NG tube BID WC  . darbepoetin (ARANESP) injection - NON-DIALYSIS  150 mcg Subcutaneous Q Mon-1800  . heparin subcutaneous  5,000 Units Subcutaneous Q8H  . hydrALAZINE  25 mg Oral Q8H  . insulin aspart  0-9 Units Subcutaneous TID AC & HS  . lanthanum  1,000 mg Oral TID WC  . multivitamin with minerals  1 tablet Oral Daily  . nystatin   Topical TID  . nystatin-triamcinolone   Topical TID  . pantoprazole  40 mg Oral Daily  . simvastatin  20 mg Oral q1800  . sodium chloride  10-40 mL Intracatheter Q12H   Continuous Infusions: . sodium chloride Stopped (01/01/13 1400)  . sodium chloride Stopped (12/31/12 2300)      Time spent: 25 minutes    Brityn Mastrogiovanni  [facility] Pager [phone]. If 7PM-7AM, please contact night-coverage at [email or web address], password [facility] 01/12/2013, 3:31 PM  LOS: 14 days

## 2013-01-12 NOTE — Progress Notes (Signed)
    Subjective:  Pt seen in hemodialysis. No complaints.   Objective:  Vital Signs in the last 24 hours: Temp:  [98 F (36.7 C)-98.9 F (37.2 C)] 98 F (36.7 C) (08/07 1710) Pulse Rate:  [74-101] 96 (08/07 1710) Resp:  [18-23] 20 (08/07 1710) BP: (103-133)/(63-74) 129/69 mmHg (08/07 1710) SpO2:  [93 %-99 %] 97 % (08/07 1303) Weight:  [112.81 kg (248 lb 11.2 oz)-113 kg (249 lb 1.9 oz)] 113 kg (249 lb 1.9 oz) (08/07 1303)  Intake/Output from previous day: 08/06 0701 - 08/07 0700 In: 710 [P.O.:660; IV Piggyback:50] Out: -   Physical Exam: Pt is alert and oriented, pleasant obese male in NAD HEENT: normal Neck: JVP - normal, carotids 2+= without bruits Lungs: CTA bilaterally CV: RRR without murmur or gallop Abd: soft, NT, Positive BS, no hepatomegaly Ext: no C/C/E, distal pulses intact and equal Skin: warm/dry no rash   Lab Results:  Recent Labs  01/10/13 0545 01/11/13 0500  WBC 20.9* 17.3*  HGB 9.5* 9.8*  PLT 316 384    Recent Labs  01/11/13 0500 01/12/13 0502  NA 135 132*  K 4.7 5.3*  CL 95* 91*  CO2 23 21  GLUCOSE 107* 90  BUN 81* 107*  CREATININE 7.46* 10.50*   No results found for this basename: TROPONINI, CK, MB,  in the last 72 hours  Cardiac Studies: 2D Echo: Left ventricle: The cavity size was normal. Wall thickness was normal. Systolic function was normal. The estimated ejection fraction was in the range of 55% to 60%. Although no diagnostic regional wall motion abnormality was identified, this possibility cannot be completely excluded on the basis of this study. Definity echo contrast was used. Doppler parameters are consistent with abnormal left ventricular relaxation (grade 1 diastolic dysfunction).  ------------------------------------------------------------ Aortic valve: Doppler: There was no stenosis. No regurgitation.  ------------------------------------------------------------ Aorta: Aortic root: The aortic root was normal in  size. Ascending aorta: The ascending aorta was normal in size.  ------------------------------------------------------------ Mitral valve: Doppler: There was no evidence for stenosis. No significant regurgitation.  ------------------------------------------------------------ Left atrium: The atrium was mildly dilated.  ------------------------------------------------------------ Right ventricle: The cavity size was normal. Systolic function was normal.  ------------------------------------------------------------ Right atrium: The atrium was normal in size.  ------------------------------------------------------------ Pericardium: There was no pericardial effusion.  Assessment/Plan:  1. Cardiogenic shock - resolved  2. Acute renal failure - still on HD  3. NICM - LVEF severely depressed post-arrest. Repeat echo has shown full recovery of LV function 4. HTN - BP stable on current medical therapy.  Tonny Bollman, M.D. 01/12/2013, 5:12 PM

## 2013-01-13 DIAGNOSIS — I5021 Acute systolic (congestive) heart failure: Secondary | ICD-10-CM

## 2013-01-13 DIAGNOSIS — D649 Anemia, unspecified: Secondary | ICD-10-CM

## 2013-01-13 DIAGNOSIS — I509 Heart failure, unspecified: Secondary | ICD-10-CM

## 2013-01-13 DIAGNOSIS — I10 Essential (primary) hypertension: Secondary | ICD-10-CM | POA: Diagnosis present

## 2013-01-13 LAB — GLUCOSE, CAPILLARY
Glucose-Capillary: 267 mg/dL — ABNORMAL HIGH (ref 70–99)
Glucose-Capillary: 100 mg/dL — ABNORMAL HIGH (ref 70–99)
Glucose-Capillary: 155 mg/dL — ABNORMAL HIGH (ref 70–99)

## 2013-01-13 LAB — RENAL FUNCTION PANEL
BUN: 48 mg/dL — ABNORMAL HIGH (ref 6–23)
CO2: 25 mEq/L (ref 19–32)
Chloride: 95 mEq/L — ABNORMAL LOW (ref 96–112)
GFR calc Af Amer: 10 mL/min — ABNORMAL LOW (ref >90)
Glucose, Bld: 97 mg/dL (ref 70–99)
Potassium: 4.7 mEq/L (ref 3.5–5.1)
Sodium: 135 mEq/L (ref 135–145)

## 2013-01-13 NOTE — Progress Notes (Signed)
18M w/ acute systolic HF / cardiogenic shock [resolved] and AKI 2/2 ATN, req RRT after R achilles repair 7/20 and cardiac arrest with VTach and resp failure req Impella device.   Subjective:  HD yesterday, 2L UF Small amt of urine made yesterday  No new events  08/07 0701 - 08/08 0700 In: 960 [P.O.:960] Out: 2100 [Urine:100]  Filed Weights   01/11/13 2255 01/12/13 1303 01/12/13 2053  Weight: 112.81 kg (248 lb 11.2 oz) 113 kg (249 lb 1.9 oz) 113 kg (249 lb 1.9 oz)    Current meds: reviewed PhosLo and Fosrenol Current Labs: reviewed    Physical Exam:  Blood pressure 94/60, pulse 96, temperature 98.7 F (37.1 C), temperature source Oral, resp. rate 18, height 6' (1.829 m), weight 113 kg (249 lb 1.9 oz), SpO2 94.00%. NAD CTAB RRR nl s1s2 No LEE Obese, abd s/nt/nd No rashes R IJ tunneled, clean bandage.  Assessment/Plan 1. AKI 2/2 ATN [unclear baseline] / cardiogenic shock, arrest, and contrast nephropathy: Started HD 7/25. HD today. Remains anuric. Will keep on THS until any sign of renal recovery.  2. Cardiogenic shock; per cards, improved  3. Hypoxic RF; resolved  4. Anemia; on aranesp, follow  5. MSSA on BAL  6. Hyperphosphatemia; Binders started 8/4; added lanthanum when PHos was 10.4, when < 4.5 will d/c to [facility] only  Sabra Heck MD 01/13/2013, 11:11 AM   Recent Labs Lab 01/11/13 0500 01/12/13 0502 01/13/13 0440  NA 135 132* 135  K 4.7 5.3* 4.7  CL 95* 91* 95*  CO2 23 21 25   GLUCOSE 107* 90 97  BUN 81* 107* 48*  CREATININE 7.46* 10.50* 6.64*  CALCIUM 8.7 8.8 8.7  PHOS 9.4* 10.4* 6.7*    Recent Labs Lab 01/09/13 0430 01/10/13 0545 01/11/13 0500  WBC 19.7* 20.9* 17.3*  NEUTROABS 15.2*  --   --   HGB 9.7* 9.5* 9.8*  HCT 29.4* 28.4* 29.4*  MCV 94.8 95.0 95.8  PLT 279 316 384    Current Facility-Administered Medications  Medication Dose Route Frequency Provider Last Rate Last Dose  . 0.9 %  sodium chloride infusion  250 mL Intravenous PRN Jeanella Craze, NP 0 mL/hr at 01/02/13 1700    . 0.9 %  sodium chloride infusion   Intravenous Continuous Alyson Reedy, MD      . 0.9 %  sodium chloride infusion   Intravenous Continuous Oretha Milch, MD      . antiseptic oral rinse (BIOTENE) solution 15 mL  15 mL Mouth Rinse TID WC & HS Alyson Reedy, MD   15 mL at 01/11/13 1703  . aspirin tablet 325 mg  325 mg Oral Daily Chuck Hint, MD   325 mg at 01/13/13 1043  . calcium acetate (PHOSLO) capsule 1,334 mg  1,334 mg Oral TID WC Arita Miss, MD   1,334 mg at 01/13/13 0900  . carvedilol (COREG) tablet 6.25 mg  6.25 mg Per NG tube BID WC Tonny Bollman, MD   6.25 mg at 01/13/13 0859  . darbepoetin (ARANESP) injection 150 mcg  150 mcg Subcutaneous Q Mon-1800 Sadie Haber, MD   150 mcg at 01/09/13 1558  . feeding supplement (NEPRO CARB STEADY) liquid 237 mL  237 mL Oral PRN Arita Miss, MD      . fentaNYL (SUBLIMAZE) injection 25-100 mcg  25-100 mcg Intravenous Q2H PRN Coralyn Helling, MD   25 mcg at 01/09/13 2017  . heparin injection 1,000 Units  1,000  Units Dialysis PRN Arita Miss, MD      . heparin injection 2,300 Units  20 Units/kg Dialysis PRN Arita Miss, MD   2,300 Units at 01/10/13 1038  . heparin injection 5,000 Units  5,000 Units Subcutaneous Q8H Nelda Bucks, MD   5,000 Units at 01/13/13 [phone]  . hydrALAZINE (APRESOLINE) injection 10 mg  10 mg Intravenous Q4H PRN Coralyn Helling, MD      . insulin aspart (novoLOG) injection 0-9 Units  0-9 Units Subcutaneous TID AC & HS Alyson Reedy, MD   5 Units at 01/13/13 0900  . lanthanum (FOSRENOL) chewable tablet 1,000 mg  1,000 mg Oral TID WC Arita Miss, MD   1,000 mg at 01/13/13 0859  . lidocaine (PF) (XYLOCAINE) 1 % injection 5 mL  5 mL Intradermal PRN Arita Miss, MD      . lidocaine-prilocaine (EMLA) cream 1 application  1 application Topical PRN Arita Miss, MD      . metoprolol (LOPRESSOR) injection 5 mg  5 mg Intravenous Q4H PRN Coralyn Helling, MD      .  multivitamin with minerals tablet 1 tablet  1 tablet Oral Daily Alyson Reedy, MD   1 tablet at 01/13/13 1043  . nystatin (MYCOSTATIN/NYSTOP) topical powder   Topical TID Zigmund Gottron, MD      . nystatin-triamcinolone [facility]) cream   Topical TID Zigmund Gottron, MD      . ondansetron [facility]) injection 4 mg  4 mg Intravenous Q6H PRN Tonny Bollman, MD      . oxyCODONE (Oxy IR/ROXICODONE) immediate release tablet 10 mg  10 mg Oral Q4H PRN Chuck Hint, MD      . pantoprazole (PROTONIX) EC tablet 40 mg  40 mg Oral Daily Alyson Reedy, MD   40 mg at 01/13/13 1043  . pentafluoroprop-tetrafluoroeth (GEBAUERS) aerosol 1 application  1 application Topical PRN Arita Miss, MD      . simvastatin (ZOCOR) tablet 20 mg  20 mg Oral q1800 Chuck Hint, MD   20 mg at 01/12/13 1752  . sodium chloride 0.9 % injection 10-40 mL  10-40 mL Intracatheter Q12H Alyson Reedy, MD   10 mL at 01/13/13 1043  . sodium chloride 0.9 % injection 10-40 mL  10-40 mL Intracatheter PRN Alyson Reedy, MD      . traMADol Janean Sark) tablet 50 mg  50 mg Oral Daily PRN Chuck Hint, MD

## 2013-01-13 NOTE — Progress Notes (Signed)
Will f/up w/ pt on Monday to see how he is progressing in therapy & w/ renal improvement, & to find out if he will have insurance auth for CIR.  [phone]

## 2013-01-13 NOTE — Progress Notes (Signed)
[facility] PROGRESS NOTE  Nicholas Potts:[id number] DOB: 05/01/1961 DOA: 12/29/2012 PCP: No primary provider on file.    Brief narrative:  Please refer to detailed  narrative from 8/6., in brief 52 year old hispanic male with recent  right Achilles tendon reconstruction was brought in for apnea due to  Unintentional narcotic overdose.  Patient was found to have respiratory acidosis and hypothermia requiring intubation in the ER. Subsequent course complicated by hypotension, profound bradycardia and became pulseless requiring CPR. Patient then went into V. tach requiring defibrillation and amiodarone. He was stabilized and transferred to ICU. Emergent echo done showed severe wall motion abnormality. He subsequently coded 2 more times and taken to cath lab immediately. Patient found to have severe cardiogenic shock and severely in the dysfunction with EF of 20%. Patient was placed on the impella dynamic support system along with pressors . Patient also developed oliguric renal failure related to shock for which a hemodialysis catheter was placed and CVVHD initiated. Patient had severe encephalopathy secondary to acute insult. Neurology was consulted and had an EEG done which showed moderate to severe generalized nonspecific slowing. Patient's mental status slowly started to improve and after successful extubation he was transferred to step down. Patient now transferred to medical floor for further management.  Assessment/Plan:  Cardiac and respiratory arrest with ventricular tachycardia Likely due to Accidental opiate overdose with Aspiration pneumonia  -Patient required ventilatory initially now successfully extubated and stable. -Patient has been empirically treated with 7 days of antibiotics for pneumonia. Cultures negative - Saturating in the mid 90s on room air.  Cardiogenic shock -required Impella for circulatory support initially  -EF 20% per cath. -nonobstructive CAD noted  as well. -Impella device removed 7/26  -Deep to the echo done on 8/6 showing improvement in EF of 55-60%.  -cont Coreg, and Zocor. Hydralazine DC'd by cardiology due to borderline blood pressures.  - Cardiology following. Repeat echo shows full recovery of LV function.  ARF (acute renal failure)  -Likely ATN with circulatory shock & contrast nephropathy -Patient initiated on CVVHD and now getting hemodialysis. Currently anuric.  -hopeful for full renal recovery. Renal following closely.  - Last HD on 8/7.  HTN  -Continue carvedilol. Hydralazine discontinued by cardiology due to borderline blood pressures.  Anemia and thrombocytopenia  related to acute shock. Getting Aranesp with dialysis. Cytopenia has resolved  Recent right Achilles tendon repair   -Seen by Dr. Victorino Dike on 8/6. Sutures removed from right CM cath was recommends weight reading on the right foot as tolerated with heel lift  in place. Patient seen ambulating with PT and rolling walker.  OSA -Continue nightly CPAP  Leukocytosis -? Etiology. No clear focus of sepsis. Follow CBCs.  DVT prophylaxis: Subcutaneous heparin  Code Status: Full  Family Communication: Discussed with patient's son at bedside.  Disposition Plan; pending duration of HD ( transient vs longterm if patient needs CLIP). Eventually needs SNF.? CIR pending insurance  Consultants:  Cardiology  Nephrology  Neurology  Vascular surgery  Orthopedic surgery   Procedures:  Left heart catheterization 12/30/2012  1. Severe cardiogenic shock with severe left ventricular systolic dysfunction  2. Mild nonobstructive coronary artery disease with slow coronary flow  EEG 01/02/2013  abnormal with moderately severe generalized nonspecific slowing of cerebral activity, which can be seen with a wide variety of encephalopathic processes. No epileptic activity was recorded.  Removal of right IJ temporary HD catheter and Ultrasound guided placement of right IJ Diatek  catheter 01/09/2013   CULTURES:  Sputum  7/25>>>non-pathogenic oropharyngeal-type flora insolated (rare WBC present, mostly PMN)  BCx2 7/24>>>ng  UC 7/24>>>ng  C. Diff 7/29>>>neg  Bronch bal 7/30>>>rare WBC, mostly PMN>>>Staph aureus   Antibiotics:  Vanco 7/24>>>>7/28  Zosyn 7/2/4>>>7/28  Ceftriaxone 7/28>>>7/31>>>8/4>>>8/6  Vanco 8/1>>>8/2   HPI/Subjective:  Interpreter-patient's son at bedside. Patient has done very well compared to admission. No specific complaints.? Hard breathing and breathing sounds at times-during sleep.  Objective: Filed Vitals:   01/13/13 1354  BP: 115/54  Pulse: 90  Temp: 98.3 F (36.8 C)  Resp: 18    Intake/Output Summary (Last 24 hours) at 01/13/13 1512 Last data filed at 01/13/13 0800  Gross per 24 hour  Intake    480 ml  Output   2130 ml  Net  -1650 ml   Filed Weights   01/11/13 2255 01/12/13 1303 01/12/13 2053  Weight: 112.81 kg (248 lb 11.2 oz) 113 kg (249 lb 1.9 oz) 113 kg (249 lb 1.9 oz)    Exam:   General: Middle-aged male lying in bed in no acute distress  Chest: Clear to auscultation bilaterally, no added sounds, right dialysis catheter. No increased work of breathing.  Cardiovascular: Normal S1 and S2, no murmurs rub or gallop. No pedal edema. Telemetry: Sinus rhythm.  Abdomen: Soft, nontender, nondistended, bowel sounds present, 14 place  Extremities: Warm, no edema,   CNS: AAO x3, nonfocal   Data Reviewed: Basic Metabolic Panel:  Recent Labs Lab 01/07/13 0400  01/08/13 0320 01/09/13 0430 01/10/13 0545 01/11/13 0500 01/12/13 0502 01/13/13 0440  NA 135  < > 133* 136 137 135 132* 135  K 4.3  < > 4.8 5.4* 5.1 4.7 5.3* 4.7  CL 97  < > 96 95* 96 95* 91* 95*  CO2 22  < > 20 18* 21 23 21 25   GLUCOSE 115*  < > 120* 101* 100* 107* 90 97  BUN 74*  < > 94* 144* 118* 81* 107* 48*  CREATININE 3.61*  < > 4.82* 8.95* 8.79* 7.46* 10.50* 6.64*  CALCIUM 8.7  < > 8.3* 8.4 8.6 8.7 8.8 8.7  MG 3.0*  --  3.2*  --  3.2*  --    --   --   PHOS 6.1*  < > 8.4* 13.2* 11.5* 9.4* 10.4* 6.7*  < > = values in this interval not displayed. Liver Function Tests:  Recent Labs Lab 01/08/13 0320 01/09/13 0430 01/10/13 0545 01/11/13 0500 01/12/13 0502 01/13/13 0440  AST 61*  --   --   --   --   --   ALT 159*  --   --   --   --   --   ALKPHOS 180*  --   --   --   --   --   BILITOT 0.5  --   --   --   --   --   PROT 7.4  --   --   --   --   --   ALBUMIN 2.5* 2.6* 2.5* 2.6* 2.8* 2.8*   No results found for this basename: LIPASE, AMYLASE,  in the last 168 hours No results found for this basename: AMMONIA,  in the last 168 hours CBC:  Recent Labs Lab 01/07/13 0400 01/08/13 0320 01/09/13 0430 01/10/13 0545 01/11/13 0500  WBC 21.2* 20.0* 19.7* 20.9* 17.3*  NEUTROABS  --   --  15.2*  --   --   HGB 10.0* 9.8* 9.7* 9.5* 9.8*  HCT 31.1* 30.7* 29.4* 28.4* 29.4*  MCV 96.9  97.2 94.8 95.0 95.8  PLT 155 213 279 316 384   Cardiac Enzymes: No results found for this basename: CKTOTAL, CKMB, CKMBINDEX, TROPONINI,  in the last 168 hours BNP (last 3 results)  Recent Labs  12/30/12 0500  PROBNP 3296.0*   CBG:  Recent Labs Lab 01/12/13 1128 01/12/13 1742 01/12/13 2056 01/13/13 0828 01/13/13 1206  GLUCAP 128* 94 196* 267* 100*    Recent Results (from the past 240 hour(s))  CLOSTRIDIUM DIFFICILE BY PCR     Status: None   Collection Time    01/03/13  4:35 PM      Result Value Range Status   C difficile by pcr NEGATIVE  NEGATIVE Final  CULTURE, BAL-QUANTITATIVE     Status: None   Collection Time    01/04/13  3:35 PM      Result Value Range Status   Specimen Description BRONCHIAL ALVEOLAR LAVAGE   Final   Special Requests BRONCH WASH LL LOBE    Final   Gram Stain     Final   Value: RARE WBC PRESENT, PREDOMINANTLY PMN     NO SQUAMOUS EPITHELIAL CELLS SEEN     NO ORGANISMS SEEN   Colony Count 30,000 COLONIES/ML   Final   Culture     Final   Value: STAPHYLOCOCCUS AUREUS     Note: RIFAMPIN AND GENTAMICIN  SHOULD NOT BE USED AS SINGLE DRUGS FOR TREATMENT OF STAPH INFECTIONS.   Report Status 01/07/2013 FINAL   Final   Organism ID, Bacteria STAPHYLOCOCCUS AUREUS   Final     Studies: No results found.  Scheduled Meds: . antiseptic oral rinse  15 mL Mouth Rinse TID WC & HS  . aspirin  325 mg Oral Daily  . calcium acetate  1,334 mg Oral TID WC  . carvedilol  6.25 mg Per NG tube BID WC  . darbepoetin (ARANESP) injection - NON-DIALYSIS  150 mcg Subcutaneous Q Mon-1800  . heparin subcutaneous  5,000 Units Subcutaneous Q8H  . insulin aspart  0-9 Units Subcutaneous TID AC & HS  . lanthanum  1,000 mg Oral TID WC  . multivitamin with minerals  1 tablet Oral Daily  . nystatin   Topical TID  . nystatin-triamcinolone   Topical TID  . pantoprazole  40 mg Oral Daily  . simvastatin  20 mg Oral q1800  . sodium chloride  10-40 mL Intracatheter Q12H   Continuous Infusions: . sodium chloride Stopped (01/01/13 1400)  . sodium chloride Stopped (12/31/12 2300)      Time spent: 25 minutes    [facility]  [facility] Pager [phone]. If 7PM-7AM, please contact night-coverage at [email or web address], password [facility] 01/13/2013, 3:12 PM  LOS: 15 days

## 2013-01-13 NOTE — Progress Notes (Signed)
Physical Therapy Treatment Patient Details Name: Nicholas Potts MRN: [id number] DOB: 11/06/1960 Today's Date: 01/13/2013 Time: 4098-1191 PT Time Calculation (min): 29 min  PT Assessment / Plan / Recommendation  History of Present Illness 52 year old male for evaluation of shock. The patient was intubated and had been coded 3 separate times. The patient had repair of his Achilles tendon 2 days prior to admission. He apparently had some difficulties with respiration postoperatively but otherwise did well. He has been taking pain medication as instructed based on his family's report. He was last seen lucid at 9 AM and at approximately 11:30 AM his family found him unresponsive and "white foam coming from his mouth". EMS was called and he was brought to the emergency room where he suffered cardiac arrest 3 separate times. There is note of PEA arrest.   PT Comments   Pt continues to show improvement in mobility, though is limited by pain and fatigue.  Pt denies need for pain meds and per son pt never took meds for pain at home.  Still feel pt would benefit from CIR.    Follow Up Recommendations  CIR     Does the patient have the potential to tolerate intense rehabilitation     Barriers to Discharge        Equipment Recommendations  Rolling walker with 5" wheels    Recommendations for Other Services Rehab consult  Frequency Min 5X/week   Progress towards PT Goals Progress towards PT goals: Progressing toward goals  Plan Current plan remains appropriate    Precautions / Restrictions Precautions Precautions: Fall Restrictions Weight Bearing Restrictions: Yes RLE Weight Bearing: Weight bearing as tolerated Other Position/Activity Restrictions: Must wear Cam boot for WBAT.     Pertinent Vitals/Pain Indicates pain, but denies need for pain meds.      Mobility  Bed Mobility Bed Mobility: Supine to Sit;Sitting - Scoot to Edge of Bed Supine to Sit: 5: Supervision;With rails;HOB  elevated Sitting - Scoot to Edge of Bed: 5: Supervision Details for Bed Mobility Assistance: Needs increased time.   Transfers Transfers: Sit to Stand;Stand to Sit Sit to Stand: 4: Min assist;With upper extremity assist;From bed;From chair/3-in-1;With armrests Stand to Sit: 4: Min assist;With upper extremity assist;To chair/3-in-1;With armrests Details for Transfer Assistance: Cues for UE use and to control descent to chair.   Ambulation/Gait Ambulation/Gait Assistance: 4: Min guard Ambulation Distance (Feet): 15 Feet (and 3 and 8) Assistive device: Rolling walker Ambulation/Gait Assistance Details: pt fatigues quickly and indicates pain in R achilles during ambulation.  cues fo upright posture.   Gait Pattern: Step-to pattern;Decreased step length - right;Decreased stance time - left;Trunk flexed Stairs: No Wheelchair Mobility Wheelchair Mobility: No    Exercises     PT Diagnosis:    PT Problem List:   PT Treatment Interventions:     PT Goals (current goals can now be found in the care plan section) Acute Rehab PT Goals PT Goal Formulation: With patient/family Time For Goal Achievement: 01/23/13 Potential to Achieve Goals: Good  Visit Information  Last PT Received On: 01/13/13 Assistance Needed: +2 (helpful for chair follow) History of Present Illness: 52 year old male for evaluation of shock. The patient was intubated and had been coded 3 separate times. The patient had repair of his Achilles tendon 2 days prior to admission. He apparently had some difficulties with respiration postoperatively but otherwise did well. He has been taking pain medication as instructed based on his family's report. He was last seen lucid at 9 AM  and at approximately 11:30 AM his family found him unresponsive and "white foam coming from his mouth". EMS was called and he was brought to the emergency room where he suffered cardiac arrest 3 separate times. There is note of PEA arrest.    Subjective  Data      Cognition  Cognition Arousal/Alertness: Awake/alert Behavior During Therapy: WFL for tasks assessed/performed Overall Cognitive Status: Impaired/Different from baseline Area of Impairment: Problem solving;Following commands Following Commands: Follows one step commands with increased time Problem Solving: Slow processing;Requires verbal cues;Requires tactile cues General Comments: Pt slow to respond & process commands.  [name] & daughter state they have noticed decreased processing for pt    Balance  Balance Balance Assessed: No  End of Session PT - End of Session Equipment Utilized During Treatment: Gait belt (Cam Boot) Activity Tolerance: Patient tolerated treatment well Patient left: in chair;with call bell/phone within reach;with family/visitor present Nurse Communication: Mobility status   GP     Nicholas Potts, [place] [phone] 01/13/2013, 9:27 AM

## 2013-01-13 NOTE — Progress Notes (Signed)
Subjective: 4 Days Post-Op Procedure(s) (LRB): INSERTION OF DIALYSIS CATHETER (Right) Patient reports pain as mild.  Able to walk in boot with heel lift.  Objective: Vital signs in last 24 hours: Temp:  [98 F (36.7 C)-98.9 F (37.2 C)] 98.9 F (37.2 C) (08/08 0556) Pulse Rate:  [82-101] 89 (08/08 0556) Resp:  [20-23] 20 (08/08 0556) BP: (98-133)/(57-73) 100/57 mmHg (08/08 0556) SpO2:  [95 %-99 %] 97 % (08/08 0556) Weight:  [113 kg (249 lb 1.9 oz)] 113 kg (249 lb 1.9 oz) (08/07 2053)  Intake/Output from previous day: 08/07 0701 - 08/08 0700 In: 960 [P.O.:960] Out: 2100 [Urine:100] Intake/Output this shift:     Recent Labs  01/11/13 0500  HGB 9.8*    Recent Labs  01/11/13 0500  WBC 17.3*  RBC 3.07*  HCT 29.4*  PLT 384    Recent Labs  01/12/13 0502 01/13/13 0440  NA 132* 135  K 5.3* 4.7  CL 91* 95*  CO2 21 25  BUN 107* 48*  CREATININE 10.50* 6.64*  GLUCOSE 90 97  CALCIUM 8.8 8.7   No results found for this basename: LABPT, INR,  in the last 72 hours  PE:  right foot incisions healing well with no signs of infection  Assessment/Plan: 4 Days Post-Op Procedure(s) (LRB): INSERTION OF DIALYSIS CATHETER (Right) Up with therapy  WBAT on RLE in cam boot and heel left.  Active ROM ok out of theboot.   Toni Arthurs 01/13/2013, 7:42 AM

## 2013-01-13 NOTE — Progress Notes (Signed)
    Subjective:  No chest pain or dyspnea  Objective:  Vital Signs in the last 24 hours: Temp:  [98 F (36.7 C)-98.9 F (37.2 C)] 98.1 F (36.7 C) (08/08 0822) Pulse Rate:  [82-101] 96 (08/08 0822) Resp:  [16-23] 16 (08/08 0822) BP: (98-133)/(44-73) 100/44 mmHg (08/08 0822) SpO2:  [95 %-99 %] 97 % (08/08 0822) Weight:  [249 lb 1.9 oz (113 kg)] 249 lb 1.9 oz (113 kg) (08/07 2053)  Intake/Output from previous day: 08/07 0701 - 08/08 0700 In: 960 [P.O.:960] Out: 2100 [Urine:100]  Physical Exam: Pt is alert and oriented, pleasant obese male in NAD HEENT: normal Neck: supple Lungs: CTA bilaterally CV: RRR without murmur or gallop Abd: soft, NT/ND Ext: no edema Skin: warm/dry no rash   Lab Results:  Recent Labs  01/11/13 0500  WBC 17.3*  HGB 9.8*  PLT 384    Recent Labs  01/12/13 0502 01/13/13 0440  NA 132* 135  K 5.3* 4.7  CL 91* 95*  CO2 21 25  GLUCOSE 90 97  BUN 107* 48*  CREATININE 10.50* 6.64*    Cardiac Studies: 2D Echo: Left ventricle: The cavity size was normal. Wall thickness was normal. Systolic function was normal. The estimated ejection fraction was in the range of 55% to 60%. Although no diagnostic regional wall motion abnormality was identified, this possibility cannot be completely excluded on the basis of this study. Definity echo contrast was used. Doppler parameters are consistent with abnormal left ventricular relaxation (grade 1 diastolic dysfunction).  ------------------------------------------------------------ Aortic valve: Doppler: There was no stenosis. No regurgitation.  ------------------------------------------------------------ Aorta: Aortic root: The aortic root was normal in size. Ascending aorta: The ascending aorta was normal in size.  ------------------------------------------------------------ Mitral valve: Doppler: There was no evidence for stenosis. No significant  regurgitation.  ------------------------------------------------------------ Left atrium: The atrium was mildly dilated.  ------------------------------------------------------------ Right ventricle: The cavity size was normal. Systolic function was normal.  ------------------------------------------------------------ Right atrium: The atrium was normal in size.  ------------------------------------------------------------ Pericardium: There was no pericardial effusion.  Assessment/Plan:  1. Cardiogenic shock - resolved 2. Acute renal failure - still on HD, management per nephrology 3. NICM - LVEF severely depressed post-arrest. Repeat echo has shown full recovery of LV function 4. HTN - BP borderline; as LV function has improved, will DC hydralazine. We will follow from a distance.  Olga Millers, M.D. 01/13/2013, 9:24 AM

## 2013-01-13 NOTE — Progress Notes (Signed)
Pt does not wear O2 at home during the day. Pt tested on room air, currently sats are at 94% on room air. Will continue to monitor.

## 2013-01-14 LAB — CBC
Hemoglobin: 9.9 g/dL — ABNORMAL LOW (ref 13.0–17.0)
MCH: 31.1 pg (ref 26.0–34.0)
MCHC: 31.7 g/dL (ref 30.0–36.0)
RDW: 14.5 % (ref 11.5–15.5)

## 2013-01-14 LAB — RENAL FUNCTION PANEL
CO2: 25 mEq/L (ref 19–32)
Calcium: 9 mg/dL (ref 8.4–10.5)
Creatinine, Ser: 9.75 mg/dL — ABNORMAL HIGH (ref 0.50–1.35)
GFR calc non Af Amer: 5 mL/min — ABNORMAL LOW (ref >90)
Phosphorus: 8.2 mg/dL — ABNORMAL HIGH (ref 2.3–4.6)

## 2013-01-14 LAB — GLUCOSE, CAPILLARY: Glucose-Capillary: 315 mg/dL — ABNORMAL HIGH (ref 70–99)

## 2013-01-14 NOTE — Progress Notes (Signed)
[facility] PROGRESS NOTE  Nicholas Potts WUJ:[id number] DOB: January 15, 1961 DOA: 12/29/2012 PCP: No primary provider on file.    Brief narrative:  Please refer to detailed  narrative from 8/6., in brief 52 year old hispanic male with recent  right Achilles tendon reconstruction was brought in for apnea due to  Unintentional narcotic overdose.  Patient was found to have respiratory acidosis and hypothermia requiring intubation in the ER. Subsequent course complicated by hypotension, profound bradycardia and became pulseless requiring CPR. Patient then went into V. tach requiring defibrillation and amiodarone. He was stabilized and transferred to ICU. Emergent echo done showed severe wall motion abnormality. He subsequently coded 2 more times and taken to cath lab immediately. Patient found to have severe cardiogenic shock and severely in the dysfunction with EF of 20%. Patient was placed on the impella dynamic support system along with pressors . Patient also developed oliguric renal failure related to shock for which a hemodialysis catheter was placed and CVVHD initiated. Patient had severe encephalopathy secondary to acute insult. Neurology was consulted and had an EEG done which showed moderate to severe generalized nonspecific slowing. Patient's mental status slowly started to improve and after successful extubation he was transferred to step down. Patient now transferred to medical floor for further management.  Assessment/Plan:  Cardiac and respiratory arrest with ventricular tachycardia - Likely due to Accidental opiate overdose with Aspiration pneumonia  - Patient required ventilatory initially now successfully extubated and stable. - Patient has been empirically treated with 7 days of antibiotics for pneumonia. Cultures negative - Saturating in the mid 90s on room air.  Cardiogenic shock -required Impella for circulatory support initially  -EF 20% per cath. -nonobstructive CAD  noted as well. -Impella device removed 7/26  -2-D echo done on 8/6 showing improvement in EF of 55-60%.  -cont Coreg, and Zocor. Hydralazine DC'd by cardiology due to borderline blood pressures.  - Cardiology signed off. Repeat echo shows full recovery of LV function.  ARF (acute renal failure)  -Likely ATN with circulatory shock & contrast nephropathy -Patient initiated on CVVHD and now getting hemodialysis. Currently anuric.  -hopeful for full renal recovery. Renal following closely.  - Management per nephrology. Plans for HD today and then monitor of HD for 2 days.  HTN  -Continue carvedilol. Hydralazine discontinued by cardiology due to borderline blood pressures.  Anemia and thrombocytopenia  related to acute shock. Getting Aranesp with dialysis. Anemia stable. Thrombocytopenia resolved.  Recent right Achilles tendon repair   -Seen by Dr. Victorino Dike on 8/6. Sutures removed from right CM cath was recommends weight reading on the right foot as tolerated with heel lift  in place. Patient seen ambulating with PT and rolling walker.  OSA -Continue nightly CPAP  Leukocytosis -? Etiology. No clear focus of sepsis. Improving  Hyperglycemia - Hemoglobin A1c 5.6. - Continue SSI and avoid precipitating diet/drinks.  DVT prophylaxis: Subcutaneous heparin  Code Status: Full  Family Communication: Discussed with patient's daughter at bedside  Disposition Plan; pending duration of HD ( transient vs longterm if patient needs CLIP). Eventually needs SNF.? CIR pending insurance  Consultants:  Cardiology  Nephrology  Neurology  Vascular surgery  Orthopedic surgery   Procedures:  Left heart catheterization 12/30/2012  1. Severe cardiogenic shock with severe left ventricular systolic dysfunction  2. Mild nonobstructive coronary artery disease with slow coronary flow  EEG 01/02/2013  abnormal with moderately severe generalized nonspecific slowing of cerebral activity, which can be seen with  a wide variety of encephalopathic processes. No epileptic  activity was recorded.  Removal of right IJ temporary HD catheter and Ultrasound guided placement of right IJ Diatek catheter 01/09/2013   CULTURES:  Sputum 7/25>>>non-pathogenic oropharyngeal-type flora insolated (rare WBC present, mostly PMN)  BCx2 7/24>>>ng  UC 7/24>>>ng  C. Diff 7/29>>>neg  Bronch bal 7/30>>>rare WBC, mostly PMN>>>Staph aureus   Antibiotics:  Vanco 7/24>>>>7/28  Zosyn 7/2/4>>>7/28  Ceftriaxone 7/28>>>7/31>>>8/4>>>8/6  Vanco 8/1>>>8/2   HPI/Subjective:  Denies complaints.  Objective: Filed Vitals:   01/14/13 1200  BP: 136/68  Pulse: 88  Temp:   Resp: 21    Intake/Output Summary (Last 24 hours) at 01/14/13 1232 Last data filed at 01/14/13 0800  Gross per 24 hour  Intake    360 ml  Output    200 ml  Net    160 ml   Filed Weights   01/12/13 2053 01/13/13 2119 01/14/13 1037  Weight: 113 kg (249 lb 1.9 oz) 113 kg (249 lb 1.9 oz) 109.3 kg (240 lb 15.4 oz)    Exam:   General: Middle-aged male lying in bed in no acute distress  Chest: Clear to auscultation bilaterally, no added sounds, right dialysis catheter. No increased work of breathing.  Cardiovascular: Normal S1 and S2, no murmurs rub or gallop. No pedal edema.  Abdomen: Soft, nontender, nondistended, bowel sounds present,  Extremities: Warm, no edema,   CNS: AAO x3, nonfocal   Data Reviewed: Basic Metabolic Panel:  Recent Labs Lab 01/08/13 0320  01/10/13 0545 01/11/13 0500 01/12/13 0502 01/13/13 0440 01/14/13 0515  NA 133*  < > 137 135 132* 135 135  K 4.8  < > 5.1 4.7 5.3* 4.7 5.0  CL 96  < > 96 95* 91* 95* 94*  CO2 20  < > 21 23 21 25 25   GLUCOSE 120*  < > 100* 107* 90 97 86  BUN 94*  < > 118* 81* 107* 48* 72*  CREATININE 4.82*  < > 8.79* 7.46* 10.50* 6.64* 9.75*  CALCIUM 8.3*  < > 8.6 8.7 8.8 8.7 9.0  MG 3.2*  --  3.2*  --   --   --   --   PHOS 8.4*  < > 11.5* 9.4* 10.4* 6.7* 8.2*  < > = values in this interval  not displayed. Liver Function Tests:  Recent Labs Lab 01/08/13 0320  01/10/13 0545 01/11/13 0500 01/12/13 0502 01/13/13 0440 01/14/13 0515  AST 61*  --   --   --   --   --   --   ALT 159*  --   --   --   --   --   --   ALKPHOS 180*  --   --   --   --   --   --   BILITOT 0.5  --   --   --   --   --   --   PROT 7.4  --   --   --   --   --   --   ALBUMIN 2.5*  < > 2.5* 2.6* 2.8* 2.8* 2.8*  < > = values in this interval not displayed. No results found for this basename: LIPASE, AMYLASE,  in the last 168 hours No results found for this basename: AMMONIA,  in the last 168 hours CBC:  Recent Labs Lab 01/08/13 0320 01/09/13 0430 01/10/13 0545 01/11/13 0500 01/14/13 0500  WBC 20.0* 19.7* 20.9* 17.3* 14.1*  NEUTROABS  --  15.2*  --   --   --   HGB  9.8* 9.7* 9.5* 9.8* 9.9*  HCT 30.7* 29.4* 28.4* 29.4* 31.2*  MCV 97.2 94.8 95.0 95.8 98.1  PLT 213 279 316 384 432*   Cardiac Enzymes: No results found for this basename: CKTOTAL, CKMB, CKMBINDEX, TROPONINI,  in the last 168 hours BNP (last 3 results)  Recent Labs  12/30/12 0500  PROBNP 3296.0*   CBG:  Recent Labs Lab 01/13/13 0828 01/13/13 1206 01/13/13 1821 01/13/13 2122 01/14/13 0834  GLUCAP 267* 100* 155* 130* 315*    Recent Results (from the past 240 hour(s))  CULTURE, BAL-QUANTITATIVE     Status: None   Collection Time    01/04/13  3:35 PM      Result Value Range Status   Specimen Description BRONCHIAL ALVEOLAR LAVAGE   Final   Special Requests BRONCH WASH LL LOBE    Final   Gram Stain     Final   Value: RARE WBC PRESENT, PREDOMINANTLY PMN     NO SQUAMOUS EPITHELIAL CELLS SEEN     NO ORGANISMS SEEN   Colony Count 30,000 COLONIES/ML   Final   Culture     Final   Value: STAPHYLOCOCCUS AUREUS     Note: RIFAMPIN AND GENTAMICIN SHOULD NOT BE USED AS SINGLE DRUGS FOR TREATMENT OF STAPH INFECTIONS.   Report Status 01/07/2013 FINAL   Final   Organism ID, Bacteria STAPHYLOCOCCUS AUREUS   Final     Studies: No  results found.  Scheduled Meds: . antiseptic oral rinse  15 mL Mouth Rinse TID WC & HS  . aspirin  325 mg Oral Daily  . calcium acetate  1,334 mg Oral TID WC  . carvedilol  6.25 mg Per NG tube BID WC  . darbepoetin (ARANESP) injection - NON-DIALYSIS  150 mcg Subcutaneous Q Mon-1800  . heparin subcutaneous  5,000 Units Subcutaneous Q8H  . insulin aspart  0-9 Units Subcutaneous TID AC & HS  . lanthanum  1,000 mg Oral TID WC  . multivitamin with minerals  1 tablet Oral Daily  . nystatin   Topical TID  . nystatin-triamcinolone   Topical TID  . pantoprazole  40 mg Oral Daily  . simvastatin  20 mg Oral q1800  . sodium chloride  10-40 mL Intracatheter Q12H   Continuous Infusions: . sodium chloride Stopped (01/01/13 1400)  . sodium chloride Stopped (12/31/12 2300)      Time spent: 25 minutes    [facility]  [facility] Pager [phone]. If 7PM-7AM, please contact night-coverage at [email or web address], password [facility] 01/14/2013, 12:32 PM  LOS: 16 days

## 2013-01-14 NOTE — Progress Notes (Signed)
Admit: 12/29/2012 LOS: 16  70M w/ acute systolic HF / cardiogenic shock [resolved] and AKI 2/2 ATN, req RRT after R achilles repair 7/20 and cardiac arrest with VTach and resp failure req Impella device.   Subjective:  No events No SOB Some inc in UOP   08/08 0701 - 08/09 0700 In: 120 [P.O.:120] Out: 130 [Urine:130]  Current meds: reviewed  Current Labs: reviewed    Physical Exam:  Blood pressure 121/57, pulse 79, temperature 98.8 F (37.1 C), temperature source Oral, resp. rate 18, height 6' (1.829 m), weight 113 kg (249 lb 1.9 oz), SpO2 96.00%. NAD, appears well RRR CTAB No LEE NO rashes R tunneled HD Cath c/d/i aao x3  Assessment/Plan 1. AKI 2/2 ATN [unclear baseline] / cardiogenic shock, arrest, and contrast nephropathy: Started HD 7/25. HD today. Remains anuric but perhaps some beginning UOP. Will keep on THS until any sign of renal recovery. After HD today will have two days to evaluate clearance. Will avoid excessive UF during HD today to ensure no further hypovolemic insults given recovery of LVEF.   2. Cardiogenic shock; per cards, improved  3. Hypoxic RF; resolved  4. Anemia; on aranesp, follow  5. MSSA on BAL  6. Hyperphosphatemia; Binders started 8/4; added lanthanum when PHos was 10.4, when < 5.5 will d/c to [facility] only   Sabra Heck MD 01/14/2013, 9:20 AM   Recent Labs Lab 01/12/13 0502 01/13/13 0440 01/14/13 0515  NA 132* 135 135  K 5.3* 4.7 5.0  CL 91* 95* 94*  CO2 21 25 25   GLUCOSE 90 97 86  BUN 107* 48* 72*  CREATININE 10.50* 6.64* 9.75*  CALCIUM 8.8 8.7 9.0  PHOS 10.4* 6.7* 8.2*    Recent Labs Lab 01/09/13 0430 01/10/13 0545 01/11/13 0500 01/14/13 0500  WBC 19.7* 20.9* 17.3* 14.1*  NEUTROABS 15.2*  --   --   --   HGB 9.7* 9.5* 9.8* 9.9*  HCT 29.4* 28.4* 29.4* 31.2*  MCV 94.8 95.0 95.8 98.1  PLT 279 316 384 432*    Current Facility-Administered Medications  Medication Dose Route Frequency Provider Last Rate Last Dose  . 0.9 %   sodium chloride infusion  250 mL Intravenous PRN Jeanella Craze, NP 0 mL/hr at 01/02/13 1700    . 0.9 %  sodium chloride infusion   Intravenous Continuous Alyson Reedy, MD      . 0.9 %  sodium chloride infusion   Intravenous Continuous Oretha Milch, MD      . antiseptic oral rinse (BIOTENE) solution 15 mL  15 mL Mouth Rinse TID WC & HS Alyson Reedy, MD   15 mL at 01/14/13 0857  . aspirin tablet 325 mg  325 mg Oral Daily Chuck Hint, MD   325 mg at 01/13/13 1043  . calcium acetate (PHOSLO) capsule 1,334 mg  1,334 mg Oral TID WC Arita Miss, MD   1,334 mg at 01/14/13 0857  . carvedilol (COREG) tablet 6.25 mg  6.25 mg Per NG tube BID WC Tonny Bollman, MD   6.25 mg at 01/14/13 0857  . darbepoetin (ARANESP) injection 150 mcg  150 mcg Subcutaneous Q Mon-1800 Sadie Haber, MD   150 mcg at 01/09/13 1558  . feeding supplement (NEPRO CARB STEADY) liquid 237 mL  237 mL Oral PRN Arita Miss, MD      . fentaNYL (SUBLIMAZE) injection 25-100 mcg  25-100 mcg Intravenous Q2H PRN Coralyn Helling, MD   25 mcg at 01/09/13 2017  .  heparin injection 1,000 Units  1,000 Units Dialysis PRN Arita Miss, MD      . heparin injection 2,300 Units  20 Units/kg Dialysis PRN Arita Miss, MD   2,300 Units at 01/10/13 1038  . heparin injection 5,000 Units  5,000 Units Subcutaneous Q8H Nelda Bucks, MD   5,000 Units at 01/13/13 2232  . hydrALAZINE (APRESOLINE) injection 10 mg  10 mg Intravenous Q4H PRN Coralyn Helling, MD      . insulin aspart (novoLOG) injection 0-9 Units  0-9 Units Subcutaneous TID AC & HS Alyson Reedy, MD   7 Units at 01/14/13 0857  . lanthanum (FOSRENOL) chewable tablet 1,000 mg  1,000 mg Oral TID WC Arita Miss, MD   1,000 mg at 01/14/13 0856  . lidocaine (PF) (XYLOCAINE) 1 % injection 5 mL  5 mL Intradermal PRN Arita Miss, MD      . lidocaine-prilocaine (EMLA) cream 1 application  1 application Topical PRN Arita Miss, MD      . metoprolol (LOPRESSOR) injection 5 mg  5  mg Intravenous Q4H PRN Coralyn Helling, MD      . multivitamin with minerals tablet 1 tablet  1 tablet Oral Daily Alyson Reedy, MD   1 tablet at 01/13/13 1043  . nystatin (MYCOSTATIN/NYSTOP) topical powder   Topical TID Zigmund Gottron, MD      . nystatin-triamcinolone [facility] II) cream   Topical TID Zigmund Gottron, MD      . ondansetron [facility]) injection 4 mg  4 mg Intravenous Q6H PRN Tonny Bollman, MD      . oxyCODONE (Oxy IR/ROXICODONE) immediate release tablet 10 mg  10 mg Oral Q4H PRN Chuck Hint, MD      . pantoprazole (PROTONIX) EC tablet 40 mg  40 mg Oral Daily Alyson Reedy, MD   40 mg at 01/13/13 1043  . pentafluoroprop-tetrafluoroeth (GEBAUERS) aerosol 1 application  1 application Topical PRN Arita Miss, MD      . simvastatin (ZOCOR) tablet 20 mg  20 mg Oral q1800 Chuck Hint, MD   20 mg at 01/13/13 1809  . sodium chloride 0.9 % injection 10-40 mL  10-40 mL Intracatheter Q12H Alyson Reedy, MD   10 mL at 01/13/13 1043  . sodium chloride 0.9 % injection 10-40 mL  10-40 mL Intracatheter PRN Alyson Reedy, MD      . traMADol Janean Sark) tablet 50 mg  50 mg Oral Daily PRN Chuck Hint, MD

## 2013-01-14 NOTE — Progress Notes (Signed)
Clinical Social Work Department BRIEF PSYCHOSOCIAL ASSESSMENT 01/14/2013  Patient:  Nicholas Potts, Nicholas Potts     Account Number:  [id number]     Admit date:  12/29/2012  Clinical Social Worker:  Hadley Pen  Date/Time:  01/14/2013 11:36 AM  Referred by:  Physician  Date Referred:  01/13/2013 Referred for  SNF Placement   Other Referral:   Interview type:  Patient Other interview type:   Family- Daughter Nicholas Potts    PSYCHOSOCIAL DATA Living Status:  ALONE Admitted from facility:   Level of care:   Primary support name:  Nicholas Potts Primary support relationship to patient:  CHILD, ADULT Degree of support available:   Fair    CURRENT CONCERNS Current Concerns  Post-Acute Placement   Other Concerns:    SOCIAL WORK ASSESSMENT / PLAN Weekend CSW met with patient and his daughter Nicholas Potts to discuss recommended PT following discharge. CSW informed pt and daughter that CIR would be evaluating patient on Monday 01/16/13 and that SNF would be a beneficial back up plan. Daughter and patient consented for CSW to fax out information for potential bed offers. Weekday CSW to follow for discharge planning.   Assessment/plan status:  Information/Referral to [facility] Other assessment/ plan:   Information/referral to community resources:   Weekend CSW provided patient and daughter with a list of SNF facilities in the [place] area.    PATIENT'S/FAMILY'S RESPONSE TO PLAN OF CARE: Patient and daughter Nicholas Potts were agreeable to CIR or SNF placement for short term PT.     Nicholas Potts Nicholas Potts, Nicholas Potts Emergency Dept. [phone]

## 2013-01-14 NOTE — Procedures (Signed)
I was present at this dialysis session. I have reviewed the session itself and made appropriate changes.   Qb 400.  UF goal no more than 1L. Pt w/o complaints.  BP and HR ok.   Sabra Heck  MD 01/14/2013, 11:30 AM

## 2013-01-14 NOTE — Progress Notes (Addendum)
Clinical Social Work Department CLINICAL SOCIAL WORK PLACEMENT NOTE 01/14/2013  Patient:  Nicholas Potts, Nicholas Potts  Account Number:  [id number] Admit date:  12/29/2012  Clinical Social Worker:  Samuella Bruin, Theresia Majors  Date/time:  01/14/2013 11:46 AM  Clinical Social Work is seeking post-discharge placement for this patient at the following level of care:   SKILLED NURSING   (*CSW will update this form in Epic as items are completed)   01/14/2013  Patient/family provided with [facility] Department of Clinical Social Work's list of facilities offering this level of care within the geographic area requested by the patient (or if unable, by the patient's family).  01/14/2013  Patient/family informed of their freedom to choose among providers that offer the needed level of care, that participate in Medicare, Medicaid or managed care program needed by the patient, have an available bed and are willing to accept the patient.  01/14/2013  Patient/family informed of [facility]' ownership interest in [facility], as well as of the fact that they are under no obligation to receive care at this facility.  PASARR submitted to EDS on 01/14/2013 PASARR number received from EDS on 01/14/2013 [id number]   FL2 transmitted to all facilities in geographic area requested by pt/family on  01/14/2013 FL2 transmitted to all facilities within larger geographic area on   Patient informed that his/her managed care company has contracts with or will negotiate with  certain facilities, including the following:     Patient/family informed of bed offers received:   Patient chooses bed at [facility] Physician recommends and patient chooses bed at    Patient to be transferred to [facility] on 01/27/13  Patient to be transferred to facility by ambulance  The following physician request were entered in Epic:  Additional Comments: 01/20/13 - [employer] received insurance  authorization.  01/26/13 - CSW talked with patient's son Keyontay and other family that was in the room. Son will communicate with his sister's that patient will d/c Friday to [facility]. Son concerned about CPAP machine and CSW informed him that facility will have machine for him on Friday. 01/27/13 - Additional clinicals sent to SNF for insurance company. Patient approved for 5 days initially.   Samuella Bruin, LCSWA [facility] Emergency Dept. [phone]

## 2013-01-14 NOTE — Progress Notes (Signed)
Weekend CSW met with patient and patient's daughter, [name], regarding PT following discharge. Weekend CSW informed them that CIR would be doing an assessment on patient on Monday, but that SNF placement could be beneficial if he was not accepted into CIR. Patient and daughter consented to have CSW fax out for SNF bed offers. Daughter, [name], also provided telephone number for patient's other daughter [name] [phone]. Weekday CSW to follow.  Samuella Bruin, LCSWA [facility] Emergency Dept. [phone]

## 2013-01-15 LAB — GLUCOSE, CAPILLARY
Glucose-Capillary: 84 mg/dL (ref 70–99)
Glucose-Capillary: 89 mg/dL (ref 70–99)

## 2013-01-15 LAB — CBC
HCT: 30.8 % — ABNORMAL LOW (ref 39.0–52.0)
MCV: 98.7 fL (ref 78.0–100.0)
RBC: 3.12 MIL/uL — ABNORMAL LOW (ref 4.22–5.81)
RDW: 14.9 % (ref 11.5–15.5)
WBC: 13 10*3/uL — ABNORMAL HIGH (ref 4.0–10.5)

## 2013-01-15 LAB — RENAL FUNCTION PANEL
BUN: 47 mg/dL — ABNORMAL HIGH (ref 6–23)
Glucose, Bld: 92 mg/dL (ref 70–99)
Phosphorus: 6.3 mg/dL — ABNORMAL HIGH (ref 2.3–4.6)
Potassium: 4.6 mEq/L (ref 3.5–5.1)

## 2013-01-15 NOTE — Progress Notes (Signed)
Admit: 12/29/2012 LOS: 17  28M w/ acute systolic HF / cardiogenic shock [resolved] and AKI 2/2 ATN, req RRT after R achilles repair 7/20 and cardiac arrest with VTach and resp failure req Impella device.   Subjective:  No events HD yesterday, 1L UF OOB to chair more Some UOP and he knows importance of measuring all UOP.   08/09 0701 - 08/10 0700 In: 960 [P.O.:960] Out: 1150 [Urine:150]  Current meds: reviewed including PhosLo, Aranesp, Fosrenol Current Labs: reviewed    Physical Exam:  Blood pressure 104/58, pulse 85, temperature 98.4 F (36.9 C), temperature source Oral, resp. rate 20, height 6' (1.829 m), weight 108 kg (238 lb 1.6 oz), SpO2 95.00%. NAD, appears well RRR Some bibasilar crackles, nl wob No LEE NO rashes R tunneled HD Cath c/d/i aao x3  Assessment/Plan 1. AKI 2/2 ATN [unclear baseline] / cardiogenic shock, arrest, and contrast nephropathy: Started HD 7/25. HD today. Remains anuric but perhaps some beginning UOP. Will keep on THS until any sign of renal recovery.  Have tried to keep volume status euvolemic or slightly up.  BP ok and on RA with normal breathing.  Crackles likely atelectasis on exam today.  2. Cardiogenic shock; per cards, improved  3. Hypoxic RF; resolved  4. Anemia; on aranesp, follow  5. MSSA on BAL  6. Hyperphosphatemia; Binders started 8/4; added lanthanum when PHos was 10.4, when < 5.5 will d/c to [facility] only   Sabra Heck MD 01/15/2013, 8:43 AM   Recent Labs Lab 01/13/13 0440 01/14/13 0515 01/15/13 0500  NA 135 135 136  K 4.7 5.0 4.6  CL 95* 94* 96  CO2 25 25 26   GLUCOSE 97 86 92  BUN 48* 72* 47*  CREATININE 6.64* 9.75* 7.80*  CALCIUM 8.7 9.0 8.7  PHOS 6.7* 8.2* 6.3*    Recent Labs Lab 01/09/13 0430  01/11/13 0500 01/14/13 0500 01/15/13 0500  WBC 19.7*  < > 17.3* 14.1* 13.0*  NEUTROABS 15.2*  --   --   --   --   HGB 9.7*  < > 9.8* 9.9* 9.8*  HCT 29.4*  < > 29.4* 31.2* 30.8*  MCV 94.8  < > 95.8 98.1 98.7  PLT 279   < > 384 432* 425*  < > = values in this interval not displayed.  Current Facility-Administered Medications  Medication Dose Route Frequency Provider Last Rate Last Dose  . 0.9 %  sodium chloride infusion  250 mL Intravenous PRN Jeanella Craze, NP 0 mL/hr at 01/02/13 1700    . 0.9 %  sodium chloride infusion   Intravenous Continuous Alyson Reedy, MD      . 0.9 %  sodium chloride infusion   Intravenous Continuous Oretha Milch, MD      . antiseptic oral rinse (BIOTENE) solution 15 mL  15 mL Mouth Rinse TID WC & HS Alyson Reedy, MD   15 mL at 01/15/13 0834  . aspirin tablet 325 mg  325 mg Oral Daily Chuck Hint, MD   325 mg at 01/14/13 1612  . calcium acetate (PHOSLO) capsule 1,334 mg  1,334 mg Oral TID WC Arita Miss, MD   1,334 mg at 01/15/13 0834  . carvedilol (COREG) tablet 6.25 mg  6.25 mg Per NG tube BID WC Tonny Bollman, MD   6.25 mg at 01/15/13 0834  . darbepoetin (ARANESP) injection 150 mcg  150 mcg Subcutaneous Q Mon-1800 Sadie Haber, MD   150 mcg at 01/09/13 1558  .  feeding supplement (NEPRO CARB STEADY) liquid 237 mL  237 mL Oral PRN Arita Miss, MD      . fentaNYL (SUBLIMAZE) injection 25-100 mcg  25-100 mcg Intravenous Q2H PRN Coralyn Helling, MD   25 mcg at 01/09/13 2017  . heparin injection 1,000 Units  1,000 Units Dialysis PRN Arita Miss, MD      . heparin injection 2,300 Units  20 Units/kg Dialysis PRN Arita Miss, MD   2,300 Units at 01/10/13 1038  . heparin injection 5,000 Units  5,000 Units Subcutaneous Q8H Nelda Bucks, MD   5,000 Units at 01/15/13 [phone]  . hydrALAZINE (APRESOLINE) injection 10 mg  10 mg Intravenous Q4H PRN Coralyn Helling, MD      . insulin aspart (novoLOG) injection 0-9 Units  0-9 Units Subcutaneous TID AC & HS Alyson Reedy, MD   1 Units at 01/15/13 [phone]  . lanthanum (FOSRENOL) chewable tablet 1,000 mg  1,000 mg Oral TID WC Arita Miss, MD   1,000 mg at 01/15/13 0834  . lidocaine (PF) (XYLOCAINE) 1 % injection 5 mL  5 mL  Intradermal PRN Arita Miss, MD      . lidocaine-prilocaine (EMLA) cream 1 application  1 application Topical PRN Arita Miss, MD      . metoprolol (LOPRESSOR) injection 5 mg  5 mg Intravenous Q4H PRN Coralyn Helling, MD      . multivitamin with minerals tablet 1 tablet  1 tablet Oral Daily Alyson Reedy, MD   1 tablet at 01/14/13 1620  . nystatin (MYCOSTATIN/NYSTOP) topical powder   Topical TID Zigmund Gottron, MD      . nystatin-triamcinolone [facility] II) cream   Topical TID Zigmund Gottron, MD      . ondansetron [facility] Iii) injection 4 mg  4 mg Intravenous Q6H PRN Tonny Bollman, MD      . oxyCODONE (Oxy IR/ROXICODONE) immediate release tablet 10 mg  10 mg Oral Q4H PRN Chuck Hint, MD      . pantoprazole (PROTONIX) EC tablet 40 mg  40 mg Oral Daily Alyson Reedy, MD   40 mg at 01/14/13 1625  . pentafluoroprop-tetrafluoroeth (GEBAUERS) aerosol 1 application  1 application Topical PRN Arita Miss, MD      . simvastatin (ZOCOR) tablet 20 mg  20 mg Oral q1800 Chuck Hint, MD   20 mg at 01/14/13 1626  . sodium chloride 0.9 % injection 10-40 mL  10-40 mL Intracatheter Q12H Alyson Reedy, MD   10 mL at 01/14/13 2240  . sodium chloride 0.9 % injection 10-40 mL  10-40 mL Intracatheter PRN Alyson Reedy, MD      . traMADol Janean Sark) tablet 50 mg  50 mg Oral Daily PRN Chuck Hint, MD

## 2013-01-15 NOTE — Progress Notes (Signed)
[facility] PROGRESS NOTE  OMARRI EICH WGN:[id number] DOB: 06-22-1960 DOA: 12/29/2012 PCP: No primary provider on file.    Brief narrative:  Please refer to detailed  narrative from 8/6., in brief 52 year old hispanic male with recent  right Achilles tendon reconstruction was brought in for apnea due to  Unintentional narcotic overdose.  Patient was found to have respiratory acidosis and hypothermia requiring intubation in the ER. Subsequent course complicated by hypotension, profound bradycardia and became pulseless requiring CPR. Patient then went into V. tach requiring defibrillation and amiodarone. He was stabilized and transferred to ICU. Emergent echo done showed severe wall motion abnormality. He subsequently coded 2 more times and taken to cath lab immediately. Patient found to have severe cardiogenic shock and severely in the dysfunction with EF of 20%. Patient was placed on the impella dynamic support system along with pressors . Patient also developed oliguric renal failure related to shock for which a hemodialysis catheter was placed and CVVHD initiated. Patient had severe encephalopathy secondary to acute insult. Neurology was consulted and had an EEG done which showed moderate to severe generalized nonspecific slowing. Patient's mental status slowly started to improve and after successful extubation he was transferred to step down. Cardiomyopathy has resolved. ARF not resolving and still oliguric and nephrology continues to follow.  Assessment/Plan:  Cardiac and respiratory arrest with ventricular tachycardia - Likely due to Accidental opiate overdose with Aspiration pneumonia  - Patient required ventilatory initially now successfully extubated and stable. - Patient has been empirically treated with 7 days of antibiotics for pneumonia. Cultures negative - Saturating in the mid 90s on room air.  Cardiogenic shock -required Impella for circulatory support initially  -EF  20% per cath. -nonobstructive CAD noted as well. -Impella device removed 7/26  -2-D echo done on 8/6 showing improvement in EF of 55-60%.  -cont Coreg, and Zocor. Hydralazine DC'd by cardiology due to borderline blood pressures.  - Cardiology signed off. Repeat echo shows full recovery of LV function.  - Tele DCed 8/10  ARF (acute renal failure)  -Likely ATN with circulatory shock & contrast nephropathy -Patient initiated on CVVHD and now getting hemodialysis. Currently anuric.  -hopeful for full renal recovery. Renal following closely.  - Management per nephrology. UO may be improving.   HTN  -Continue carvedilol. Hydralazine discontinued by cardiology due to borderline blood pressures.  Anemia and thrombocytopenia  related to acute shock. Getting Aranesp with dialysis. Anemia stable. Thrombocytopenia resolved.  Recent right Achilles tendon repair   -Seen by Dr. Victorino Dike on 8/6. Sutures removed from right CM cath was recommends weight reading on the right foot as tolerated with heel lift  in place. Patient seen ambulating with PT and rolling walker.  OSA -Continue nightly CPAP  Leukocytosis -? Etiology. No clear focus of sepsis. Improving  Hyperglycemia - Hemoglobin A1c 5.6. - Continue SSI and avoid precipitating diet/drinks.  DVT prophylaxis: Subcutaneous heparin  Code Status: Full  Family Communication: Discussed with patient's daughter at bedside  Disposition Plan; pending duration of HD ( transient vs longterm if patient needs CLIP). Eventually needs SNF.? CIR pending insurance  Consultants:  Cardiology  Nephrology  Neurology  Vascular surgery  Orthopedic surgery   Procedures:  Left heart catheterization 12/30/2012  1. Severe cardiogenic shock with severe left ventricular systolic dysfunction  2. Mild nonobstructive coronary artery disease with slow coronary flow  EEG 01/02/2013  abnormal with moderately severe generalized nonspecific slowing of cerebral activity,  which can be seen with a wide variety of encephalopathic  processes. No epileptic activity was recorded.  Removal of right IJ temporary HD catheter and Ultrasound guided placement of right IJ Diatek catheter 01/09/2013   CULTURES:  Sputum 7/25>>>non-pathogenic oropharyngeal-type flora insolated (rare WBC present, mostly PMN)  BCx2 7/24>>>ng  UC 7/24>>>ng  C. Diff 7/29>>>neg  Bronch bal 7/30>>>rare WBC, mostly PMN>>>Staph aureus   Antibiotics:  Vanco 7/24>>>>7/28  Zosyn 7/2/4>>>7/28  Ceftriaxone 7/28>>>7/31>>>8/4>>>8/6  Vanco 8/1>>>8/2   HPI/Subjective:  Denies complaints.  Objective: Filed Vitals:   01/15/13 0730  BP: 118/73  Pulse: 75  Temp: 97.4 F (36.3 C)  Resp: 20    Intake/Output Summary (Last 24 hours) at 01/15/13 1142 Last data filed at 01/15/13 0810  Gross per 24 hour  Intake    540 ml  Output   1050 ml  Net   -510 ml   Filed Weights   01/13/13 2119 01/14/13 1037 01/14/13 1400  Weight: 113 kg (249 lb 1.9 oz) 109.3 kg (240 lb 15.4 oz) 108 kg (238 lb 1.6 oz)    Exam:   General: Middle-aged male sitting on chair in no acute distress  Chest: Clear to auscultation bilaterally, no added sounds, right dialysis catheter. No increased work of breathing.  Cardiovascular: Normal S1 and S2, no murmurs rub or gallop. No pedal edema. Tele: SR without arrythmia alarms.  Abdomen: Soft, nontender, nondistended, bowel sounds present,  Extremities: Warm, no edema,   CNS: AAO x3, nonfocal   Data Reviewed: Basic Metabolic Panel:  Recent Labs Lab 01/10/13 0545 01/11/13 0500 01/12/13 0502 01/13/13 0440 01/14/13 0515 01/15/13 0500  NA 137 135 132* 135 135 136  K 5.1 4.7 5.3* 4.7 5.0 4.6  CL 96 95* 91* 95* 94* 96  CO2 21 23 21 25 25 26   GLUCOSE 100* 107* 90 97 86 92  BUN 118* 81* 107* 48* 72* 47*  CREATININE 8.79* 7.46* 10.50* 6.64* 9.75* 7.80*  CALCIUM 8.6 8.7 8.8 8.7 9.0 8.7  MG 3.2*  --   --   --   --   --   PHOS 11.5* 9.4* 10.4* 6.7* 8.2* 6.3*   Liver  Function Tests:  Recent Labs Lab 01/11/13 0500 01/12/13 0502 01/13/13 0440 01/14/13 0515 01/15/13 0500  ALBUMIN 2.6* 2.8* 2.8* 2.8* 2.7*   No results found for this basename: LIPASE, AMYLASE,  in the last 168 hours No results found for this basename: AMMONIA,  in the last 168 hours CBC:  Recent Labs Lab 01/09/13 0430 01/10/13 0545 01/11/13 0500 01/14/13 0500 01/15/13 0500  WBC 19.7* 20.9* 17.3* 14.1* 13.0*  NEUTROABS 15.2*  --   --   --   --   HGB 9.7* 9.5* 9.8* 9.9* 9.8*  HCT 29.4* 28.4* 29.4* 31.2* 30.8*  MCV 94.8 95.0 95.8 98.1 98.7  PLT 279 316 384 432* 425*   Cardiac Enzymes: No results found for this basename: CKTOTAL, CKMB, CKMBINDEX, TROPONINI,  in the last 168 hours BNP (last 3 results)  Recent Labs  12/30/12 0500  PROBNP 3296.0*   CBG:  Recent Labs Lab 01/13/13 2122 01/14/13 0834 01/14/13 1625 01/14/13 2237 01/15/13 0734  GLUCAP 130* 315* 109* 95 84    No results found for this or any previous visit (from the past 240 hour(s)).   Studies: No results found.  Scheduled Meds: . antiseptic oral rinse  15 mL Mouth Rinse TID WC & HS  . aspirin  325 mg Oral Daily  . calcium acetate  1,334 mg Oral TID WC  . carvedilol  6.25 mg Per NG  tube BID WC  . darbepoetin (ARANESP) injection - NON-DIALYSIS  150 mcg Subcutaneous Q Mon-1800  . heparin subcutaneous  5,000 Units Subcutaneous Q8H  . insulin aspart  0-9 Units Subcutaneous TID AC & HS  . lanthanum  1,000 mg Oral TID WC  . multivitamin with minerals  1 tablet Oral Daily  . nystatin   Topical TID  . nystatin-triamcinolone   Topical TID  . pantoprazole  40 mg Oral Daily  . simvastatin  20 mg Oral q1800  . sodium chloride  10-40 mL Intracatheter Q12H   Continuous Infusions: . sodium chloride Stopped (01/01/13 1400)  . sodium chloride Stopped (12/31/12 2300)      Time spent: 25 minutes    [facility]  [facility] Pager [phone]. If 7PM-7AM, please contact night-coverage at  [email or web address], password [facility] 01/15/2013, 11:42 AM  LOS: 17 days

## 2013-01-16 LAB — RENAL FUNCTION PANEL
BUN: 68 mg/dL — ABNORMAL HIGH (ref 6–23)
CO2: 23 mEq/L (ref 19–32)
Chloride: 95 mEq/L — ABNORMAL LOW (ref 96–112)
Creatinine, Ser: 10.77 mg/dL — ABNORMAL HIGH (ref 0.50–1.35)
Glucose, Bld: 89 mg/dL (ref 70–99)

## 2013-01-16 LAB — GLUCOSE, CAPILLARY
Glucose-Capillary: 88 mg/dL (ref 70–99)
Glucose-Capillary: 90 mg/dL (ref 70–99)
Glucose-Capillary: 106 mg/dL — ABNORMAL HIGH (ref 70–99)

## 2013-01-16 NOTE — Progress Notes (Signed)
Pt daughter concerned re: possible worsening of ringworm to pt RLE. MD notified verbally, states will be in to see pt. Will continue to monitor.

## 2013-01-16 NOTE — Progress Notes (Addendum)
Physical Therapy Treatment Patient Details Name: Nicholas Potts MRN: [id number] DOB: [birth year] Today's Date: 01/16/2013 Time: 5621-3086 PT Time Calculation (min): 16 min  PT Assessment / Plan / Recommendation  History of Present Illness 52 year old male for evaluation of shock. The patient was intubated and had been coded 3 separate times. The patient had repair of his Achilles tendon 2 days prior to admission. He apparently had some difficulties with respiration postoperatively but otherwise did well. He has been taking pain medication as instructed based on his family's report. He was last seen lucid at 9 AM and at approximately 11:30 AM his family found him unresponsive and "white foam coming from his mouth". EMS was called and he was brought to the emergency room where he suffered cardiac arrest 3 separate times. There is note of PEA arrest.   PT Comments   Pt able to increase ambulation distance a little today however limited by pain and fatigue.  Also discussed with son that pt performing ankle pumps and circles for AROM of R ankle is good however pt not to stretch or do strengthening exercises yet per MD orders.   Follow Up Recommendations  CIR     Does the patient have the potential to tolerate intense rehabilitation     Barriers to Discharge        Equipment Recommendations  Rolling walker with 5" wheels    Recommendations for Other Services    Frequency     Progress towards PT Goals Progress towards PT goals: Progressing toward goals  Plan Current plan remains appropriate    Precautions / Restrictions Precautions Precautions: Fall Required Braces or Orthoses: Other Brace/Splint Other Brace/Splint: CAM boot with heel lift Restrictions RLE Weight Bearing: Weight bearing as tolerated Other Position/Activity Restrictions: CAM boot with heel lift for WBAT   Pertinent Vitals/Pain 4-5/10 R achilles pain during ambulation, son reports pt doesn't take pain meds "too  proud"    Mobility  Bed Mobility Bed Mobility: Supine to Sit;Sitting - Scoot to Edge of Bed;Sit to Supine Supine to Sit: 5: Supervision;With rails;HOB elevated Sitting - Scoot to Edge of Bed: 5: Supervision Sit to Supine: 5: Supervision Details for Bed Mobility Assistance: Needs increased time.  Required assist to don/doff CAM boot. Transfers Transfers: Sit to Stand;Stand to Sit Sit to Stand: 4: Min assist;With upper extremity assist;From bed;From chair/3-in-1;With armrests Stand to Sit: 4: Min assist;With upper extremity assist;To chair/3-in-1;With armrests;To bed Stand Pivot Transfers: 4: Min assist Details for Transfer Assistance: Cues for UE use and to control descent to chair.   Ambulation/Gait Ambulation/Gait Assistance: 4: Min guard Ambulation Distance (Feet): 12 Feet (x2) Assistive device: Rolling walker Ambulation/Gait Assistance Details: fatigues quickly, also reports pain in R achilles 4-5/10 during ambulation requiring seated rest break Gait Pattern: Step-to pattern;Trunk flexed;Decreased stance time - right Stairs: No Wheelchair Mobility Wheelchair Mobility: No    Exercises     PT Diagnosis:    PT Problem List:   PT Treatment Interventions:     PT Goals (current goals can now be found in the care plan section)    Visit Information  Last PT Received On: 01/16/13 Assistance Needed: +2 (chair following) History of Present Illness: 52 year old male for evaluation of shock. The patient was intubated and had been coded 3 separate times. The patient had repair of his Achilles tendon 2 days prior to admission. He apparently had some difficulties with respiration postoperatively but otherwise did well. He has been taking pain medication as instructed based on his family's  report. He was last seen lucid at 9 AM and at approximately 11:30 AM his family found him unresponsive and "white foam coming from his mouth". EMS was called and he was brought to the emergency room where  he suffered cardiac arrest 3 separate times. There is note of PEA arrest.    Subjective Data      Cognition  Cognition Arousal/Alertness: Awake/alert Behavior During Therapy: WFL for tasks assessed/performed Overall Cognitive Status: Impaired/Different from baseline Area of Impairment: Problem solving;Following commands Following Commands: Follows one step commands with increased time Problem Solving: Slow processing;Requires verbal cues;Requires tactile cues General Comments: Pt slow to respond & process commands.  [name] & daughter state they have noticed decreased processing for pt    Balance     End of Session PT - End of Session Equipment Utilized During Treatment: Gait belt;Other (comment) (CAM boot) Activity Tolerance: Patient tolerated treatment well Patient left: with call bell/phone within reach;with family/visitor present;in bed   GP     Minas Bonser,KATHrine E 01/16/2013, 4:02 PM Zenovia Jarred, PT, DPT 01/16/2013 Pager: [phone]

## 2013-01-16 NOTE — Progress Notes (Signed)
Need up to date PT/OT notes to evaluate continued need for CIR & to communicate to insurance.  Also, need to know when HD clipping will be completed.  Will f/up.  [phone]

## 2013-01-16 NOTE — Progress Notes (Signed)
[facility] PROGRESS NOTE  YANI LAL ZOX:[id number] DOB: 21-Jul-1960 DOA: 12/29/2012 PCP: No primary provider on file.    Brief narrative:  Please refer to detailed  narrative from 8/6., in brief 52 year old hispanic male with recent  right Achilles tendon reconstruction was brought in for apnea due to  Unintentional narcotic overdose.  Patient was found to have respiratory acidosis and hypothermia requiring intubation in the ER. Subsequent course complicated by hypotension, profound bradycardia and became pulseless requiring CPR. Patient then went into V. tach requiring defibrillation and amiodarone. He was stabilized and transferred to ICU. Emergent echo done showed severe wall motion abnormality. He subsequently coded 2 more times and taken to cath lab immediately. Patient found to have severe cardiogenic shock and severely in the dysfunction with EF of 20%. Patient was placed on the impella dynamic support system along with pressors . Patient also developed oliguric renal failure related to shock for which a hemodialysis catheter was placed and CVVHD initiated. Patient had severe encephalopathy secondary to acute insult. Neurology was consulted and had an EEG done which showed moderate to severe generalized nonspecific slowing. Patient's mental status slowly started to improve and after successful extubation he was transferred to step down. Cardiomyopathy has resolved. ARF not resolving and still oliguric and nephrology continues to follow.  Assessment/Plan:  Cardiac and respiratory arrest with ventricular tachycardia - Likely due to Accidental opiate overdose with Aspiration pneumonia  - Patient required ventilatory initially now successfully extubated and stable. - Patient has been empirically treated with 7 days of antibiotics for pneumonia. Cultures negative - Saturating in the mid 90s on room air.  Cardiogenic shock -required Impella for circulatory support initially  -EF  20% per cath. -nonobstructive CAD noted as well. -Impella device removed 7/26  -2-D echo done on 8/6 showing improvement in EF of 55-60%.  -cont Coreg, and Zocor. Hydralazine DC'd by cardiology due to borderline blood pressures.  - Cardiology signed off. Repeat echo shows full recovery of LV function.  - Tele DCed 8/10  ARF (acute renal failure)  -Likely ATN with circulatory shock & contrast nephropathy -Patient initiated on CVVHD and now getting hemodialysis. Currently anuric.  -hopeful for full renal recovery. Renal following closely.  - Management per nephrology- planning access by VVS and CLIP. Possibly ESRD.   HTN  -Continue carvedilol. Hydralazine discontinued by cardiology due to borderline blood pressures.  Anemia and thrombocytopenia  related to acute shock. Getting Aranesp with dialysis. Anemia stable. Thrombocytopenia resolved.  Recent right Achilles tendon repair   -Seen by Dr. Victorino Dike on 8/6. Sutures removed from right CM cath was recommends weight reading on the right foot as tolerated with heel lift  in place. Patient seen ambulating with PT and rolling walker. Ortho FU appreciated.  OSA -Continue nightly CPAP  Leukocytosis -? Etiology. No clear focus of sepsis. Improving  Hyperglycemia - Hemoglobin A1c 5.6. - Continue SSI and avoid precipitating diet/drinks.  DVT prophylaxis: Subcutaneous heparin  Code Status: Full  Family Communication: Discussed with patient's daughter at bedside  Disposition Plan; pending duration of HD ( transient vs longterm if patient needs CLIP). Eventually needs SNF.? CIR pending insurance  Consultants:  Cardiology  Nephrology  Neurology  Vascular surgery  Orthopedic surgery   Procedures:  Left heart catheterization 12/30/2012  1. Severe cardiogenic shock with severe left ventricular systolic dysfunction  2. Mild nonobstructive coronary artery disease with slow coronary flow  EEG 01/02/2013  abnormal with moderately severe  generalized nonspecific slowing of cerebral activity, which can be  seen with a wide variety of encephalopathic processes. No epileptic activity was recorded.  Removal of right IJ temporary HD catheter and Ultrasound guided placement of right IJ Diatek catheter 01/09/2013   CULTURES:  Sputum 7/25>>>non-pathogenic oropharyngeal-type flora insolated (rare WBC present, mostly PMN)  BCx2 7/24>>>ng  UC 7/24>>>ng  C. Diff 7/29>>>neg  Bronch bal 7/30>>>rare WBC, mostly PMN>>>Staph aureus   Antibiotics:  Vanco 7/24>>>>7/28  Zosyn 7/2/4>>>7/28  Ceftriaxone 7/28>>>7/31>>>8/4>>>8/6  Vanco 8/1>>>8/2   HPI/Subjective:  Denies complaints.  Objective: Filed Vitals:   01/16/13 0911  BP: 131/77  Pulse: 90  Temp: 100.3 F (37.9 C)  Resp: 20    Intake/Output Summary (Last 24 hours) at 01/16/13 1807 Last data filed at 01/16/13 0950  Gross per 24 hour  Intake    723 ml  Output      0 ml  Net    723 ml   Filed Weights   01/14/13 1037 01/14/13 1400 01/15/13 2016  Weight: 109.3 kg (240 lb 15.4 oz) 108 kg (238 lb 1.6 oz) 113.3 kg (249 lb 12.5 oz)    Exam:   General: Middle-aged male sitting on chair in no acute distress  Chest: Clear to auscultation bilaterally, no added sounds, right dialysis catheter. No increased work of breathing.  Cardiovascular: Normal S1 and S2, no murmurs rub or gallop. No pedal edema. Tele: SR without arrythmia alarms.  Abdomen: Soft, nontender, nondistended, bowel sounds present,  Extremities: Warm, no edema,   CNS: AAO x3, nonfocal   Data Reviewed: Basic Metabolic Panel:  Recent Labs Lab 01/10/13 0545  01/12/13 0502 01/13/13 0440 01/14/13 0515 01/15/13 0500 01/16/13 0525  NA 137  < > 132* 135 135 136 135  K 5.1  < > 5.3* 4.7 5.0 4.6 5.2*  CL 96  < > 91* 95* 94* 96 95*  CO2 21  < > 21 25 25 26 23   GLUCOSE 100*  < > 90 97 86 92 89  BUN 118*  < > 107* 48* 72* 47* 68*  CREATININE 8.79*  < > 10.50* 6.64* 9.75* 7.80* 10.77*  CALCIUM 8.6  < > 8.8  8.7 9.0 8.7 8.8  MG 3.2*  --   --   --   --   --   --   PHOS 11.5*  < > 10.4* 6.7* 8.2* 6.3* 6.4*  < > = values in this interval not displayed. Liver Function Tests:  Recent Labs Lab 01/12/13 0502 01/13/13 0440 01/14/13 0515 01/15/13 0500 01/16/13 0525  ALBUMIN 2.8* 2.8* 2.8* 2.7* 2.7*   No results found for this basename: LIPASE, AMYLASE,  in the last 168 hours No results found for this basename: AMMONIA,  in the last 168 hours CBC:  Recent Labs Lab 01/10/13 0545 01/11/13 0500 01/14/13 0500 01/15/13 0500  WBC 20.9* 17.3* 14.1* 13.0*  HGB 9.5* 9.8* 9.9* 9.8*  HCT 28.4* 29.4* 31.2* 30.8*  MCV 95.0 95.8 98.1 98.7  PLT 316 384 432* 425*   Cardiac Enzymes: No results found for this basename: CKTOTAL, CKMB, CKMBINDEX, TROPONINI,  in the last 168 hours BNP (last 3 results)  Recent Labs  12/30/12 0500  PROBNP 3296.0*   CBG:  Recent Labs Lab 01/15/13 1129 01/15/13 1553 01/15/13 2201 01/16/13 0751 01/16/13 1222  GLUCAP 89 122* 166* 88 90    No results found for this or any previous visit (from the past 240 hour(s)).   Studies: No results found.  Scheduled Meds: . antiseptic oral rinse  15 mL Mouth Rinse TID WC &  HS  . aspirin  325 mg Oral Daily  . calcium acetate  1,334 mg Oral TID WC  . carvedilol  6.25 mg Per NG tube BID WC  . darbepoetin (ARANESP) injection - NON-DIALYSIS  150 mcg Subcutaneous Q Mon-1800  . heparin subcutaneous  5,000 Units Subcutaneous Q8H  . insulin aspart  0-9 Units Subcutaneous TID AC & HS  . lanthanum  1,000 mg Oral TID WC  . multivitamin with minerals  1 tablet Oral Daily  . nystatin   Topical TID  . nystatin-triamcinolone   Topical TID  . pantoprazole  40 mg Oral Daily  . simvastatin  20 mg Oral q1800  . sodium chloride  10-40 mL Intracatheter Q12H   Continuous Infusions: . sodium chloride Stopped (01/01/13 1400)  . sodium chloride Stopped (12/31/12 2300)      Time spent: 25 minutes    [facility]  [facility] Pager [phone]. If 7PM-7AM, please contact night-coverage at [email or web address], password [facility] 01/16/2013, 6:07 PM  LOS: 18 days

## 2013-01-16 NOTE — Progress Notes (Signed)
[facility] ROUNDING NOTE   Subjective:   Interval History: fatigued easily  Objective:  Vital signs in last 24 hours:  Temp:  [98 F (36.7 C)-98.9 F (37.2 C)] 98.9 F (37.2 C) (08/11 0525) Pulse Rate:  [81-99] 99 (08/11 0530) Resp:  [18-22] 22 (08/11 0525) BP: (109-128)/(56-76) 127/70 mmHg (08/11 0530) SpO2:  [93 %-98 %] 93 % (08/11 0525) Weight:  [113.3 kg (249 lb 12.5 oz)] 113.3 kg (249 lb 12.5 oz) (08/10 2016)  Weight change: 4 kg (8 lb 13.1 oz) Filed Weights   01/14/13 1037 01/14/13 1400 01/15/13 2016  Weight: 109.3 kg (240 lb 15.4 oz) 108 kg (238 lb 1.6 oz) 113.3 kg (249 lb 12.5 oz)    Intake/Output: I/O last 3 completed shifts: In: 1320 [P.O.:1320] Out: 65 [Urine:65]   Intake/Output this shift:  Total I/O In: 360 [P.O.:360] Out: -   CVS- RRR RS- CTA ABD- BS present soft non-distended EXT- no edema   Basic Metabolic Panel:  Recent Labs Lab 01/10/13 0545  01/12/13 0502 01/13/13 0440 01/14/13 0515 01/15/13 0500 01/16/13 0525  NA 137  < > 132* 135 135 136 135  K 5.1  < > 5.3* 4.7 5.0 4.6 5.2*  CL 96  < > 91* 95* 94* 96 95*  CO2 21  < > 21 25 25 26 23   GLUCOSE 100*  < > 90 97 86 92 89  BUN 118*  < > 107* 48* 72* 47* 68*  CREATININE 8.79*  < > 10.50* 6.64* 9.75* 7.80* 10.77*  CALCIUM 8.6  < > 8.8 8.7 9.0 8.7 8.8  MG 3.2*  --   --   --   --   --   --   PHOS 11.5*  < > 10.4* 6.7* 8.2* 6.3* 6.4*  < > = values in this interval not displayed.  Liver Function Tests:  Recent Labs Lab 01/12/13 0502 01/13/13 0440 01/14/13 0515 01/15/13 0500 01/16/13 0525  ALBUMIN 2.8* 2.8* 2.8* 2.7* 2.7*   No results found for this basename: LIPASE, AMYLASE,  in the last 168 hours No results found for this basename: AMMONIA,  in the last 168 hours  CBC:  Recent Labs Lab 01/10/13 0545 01/11/13 0500 01/14/13 0500 01/15/13 0500  WBC 20.9* 17.3* 14.1* 13.0*  HGB 9.5* 9.8* 9.9* 9.8*  HCT 28.4* 29.4* 31.2* 30.8*  MCV 95.0 95.8 98.1 98.7  PLT  316 384 432* 425*    Cardiac Enzymes: No results found for this basename: CKTOTAL, CKMB, CKMBINDEX, TROPONINI,  in the last 168 hours  BNP: No components found with this basename: POCBNP,   CBG:  Recent Labs Lab 01/15/13 0734 01/15/13 1129 01/15/13 1553 01/15/13 2201 01/16/13 0751  GLUCAP 84 89 122* 166* 88    Microbiology: Results for orders placed during the hospital encounter of 12/29/12  URINE CULTURE     Status: None   Collection Time    12/29/12  3:08 PM      Result Value Range Status   Specimen Description URINE, CATHETERIZED   Final   Special Requests NONE   Final   Culture  Setup Time 12/29/2012 23:00   Final   Colony Count NO GROWTH   Final   Culture NO GROWTH   Final   Report Status 12/30/2012 FINAL   Final  CULTURE, BLOOD (ROUTINE X 2)     Status: None   Collection Time    12/29/12  3:27 PM      Result Value Range Status  Specimen Description BLOOD HAND LEFT   Final   Special Requests BOTTLES DRAWN AEROBIC ONLY 5CC   Final   Culture  Setup Time 12/29/2012 22:48   Final   Culture NO GROWTH 5 DAYS   Final   Report Status 01/04/2013 FINAL   Final  CULTURE, BLOOD (ROUTINE X 2)     Status: None   Collection Time    12/29/12  3:37 PM      Result Value Range Status   Specimen Description BLOOD ARM LEFT   Final   Special Requests BOTTLES DRAWN AEROBIC ONLY 2CC   Final   Culture  Setup Time 12/29/2012 22:48   Final   Culture NO GROWTH 5 DAYS   Final   Report Status 01/04/2013 FINAL   Final  URINE CULTURE     Status: None   Collection Time    12/29/12  7:11 PM      Result Value Range Status   Specimen Description URINE, CATHETERIZED   Final   Special Requests NONE   Final   Culture  Setup Time 12/29/2012 21:09   Final   Colony Count NO GROWTH   Final   Culture NO GROWTH   Final   Report Status 12/31/2012 FINAL   Final  MRSA PCR SCREENING     Status: None   Collection Time    12/30/12  2:24 AM      Result Value Range Status   MRSA by PCR NEGATIVE   NEGATIVE Final   Comment:            The GeneXpert MRSA Assay (FDA     approved for NASAL specimens     only), is one component of a     comprehensive MRSA colonization     surveillance program. It is not     intended to diagnose MRSA     infection nor to guide or     monitor treatment for     MRSA infections.  CULTURE, RESPIRATORY (NON-EXPECTORATED)     Status: None   Collection Time    12/30/12 11:51 AM      Result Value Range Status   Specimen Description TRACHEAL ASPIRATE   Final   Special Requests NONE   Final   Gram Stain     Final   Value: RARE WBC PRESENT, PREDOMINANTLY PMN     NO SQUAMOUS EPITHELIAL CELLS SEEN     NO ORGANISMS SEEN   Culture Non-Pathogenic Oropharyngeal-type Flora Isolated.   Final   Report Status 01/02/2013 FINAL   Final  CLOSTRIDIUM DIFFICILE BY PCR     Status: None   Collection Time    01/03/13  4:35 PM      Result Value Range Status   C difficile by pcr NEGATIVE  NEGATIVE Final  CULTURE, BAL-QUANTITATIVE     Status: None   Collection Time    01/04/13  3:35 PM      Result Value Range Status   Specimen Description BRONCHIAL ALVEOLAR LAVAGE   Final   Special Requests BRONCH WASH LL LOBE    Final   Gram Stain     Final   Value: RARE WBC PRESENT, PREDOMINANTLY PMN     NO SQUAMOUS EPITHELIAL CELLS SEEN     NO ORGANISMS SEEN   Colony Count 30,000 COLONIES/ML   Final   Culture     Final   Value: STAPHYLOCOCCUS AUREUS     Note: RIFAMPIN AND GENTAMICIN SHOULD NOT BE USED AS SINGLE DRUGS  FOR TREATMENT OF STAPH INFECTIONS.   Report Status 01/07/2013 FINAL   Final   Organism ID, Bacteria STAPHYLOCOCCUS AUREUS   Final    Coagulation Studies: No results found for this basename: LABPROT, INR,  in the last 72 hours  Urinalysis: No results found for this basename: COLORURINE, APPERANCEUR, LABSPEC, PHURINE, GLUCOSEU, HGBUR, BILIRUBINUR, KETONESUR, PROTEINUR, UROBILINOGEN, NITRITE, LEUKOCYTESUR,  in the last 72 hours    Imaging: No results  found.   Medications:   . sodium chloride Stopped (01/01/13 1400)  . sodium chloride Stopped (12/31/12 2300)   . antiseptic oral rinse  15 mL Mouth Rinse TID WC & HS  . aspirin  325 mg Oral Daily  . calcium acetate  1,334 mg Oral TID WC  . carvedilol  6.25 mg Per NG tube BID WC  . darbepoetin (ARANESP) injection - NON-DIALYSIS  150 mcg Subcutaneous Q Mon-1800  . heparin subcutaneous  5,000 Units Subcutaneous Q8H  . insulin aspart  0-9 Units Subcutaneous TID AC & HS  . lanthanum  1,000 mg Oral TID WC  . multivitamin with minerals  1 tablet Oral Daily  . nystatin   Topical TID  . nystatin-triamcinolone   Topical TID  . pantoprazole  40 mg Oral Daily  . simvastatin  20 mg Oral q1800  . sodium chloride  10-40 mL Intracatheter Q12H   sodium chloride, feeding supplement (NEPRO CARB STEADY), fentaNYL, heparin, heparin, hydrALAZINE, lidocaine (PF), lidocaine-prilocaine, ondansetron (ZOFRAN) IV, oxyCODONE, pentafluoroprop-tetrafluoroeth, sodium chloride, traMADol  Assessment/Plan   1. AKI 2/2 ATN [unclear baseline] / cardiogenic shock, arrest, and contrast nephropathy: Started HD 7/25.  Remains anuric. Will need VVS consult. Suggest CLIP 2. Cardiogenic shock; per cards, improved  3. Hypoxic RF; resolved  4. Anemia; on aranesp, follow  5. MSSA on BAL  6. Hyperphosphatemia   I would prepare patient for ESRD management   LOS: 18 Nicholas Potts W @TODAY @9 :37 AM

## 2013-01-16 NOTE — Progress Notes (Signed)
Subjective: Pt is now about 3 weeks post op from right achilles tendon debridement and reconstruction with gastroc recession.  He denies any pain in the heel.  He has been walking some in his room in the cam boot.  He is WBAT in the boot with a heel lift.  Currently dialyzing 3x / wk for renal failure.  Objective: Vital signs in last 24 hours: Temp:  [98 F (36.7 C)-98.9 F (37.2 C)] 98.9 F (37.2 C) (08/11 0525) Pulse Rate:  [81-99] 99 (08/11 0530) Resp:  [18-22] 22 (08/11 0525) BP: (109-128)/(56-76) 127/70 mmHg (08/11 0530) SpO2:  [93 %-98 %] 93 % (08/11 0525) Weight:  [113.3 kg (249 lb 12.5 oz)] 113.3 kg (249 lb 12.5 oz) (08/10 2016)  Intake/Output from previous day: 08/10 0701 - 08/11 0700 In: 960 [P.O.:960] Out: 15 [Urine:15] Intake/Output this shift:     Recent Labs  01/14/13 0500 01/15/13 0500  HGB 9.9* 9.8*    Recent Labs  01/14/13 0500 01/15/13 0500  WBC 14.1* 13.0*  RBC 3.18* 3.12*  HCT 31.2* 30.8*  PLT 432* 425*    Recent Labs  01/15/13 0500 01/16/13 0525  NA 136 135  K 4.6 5.2*  CL 96 95*  CO2 26 23  BUN 47* 68*  CREATININE 7.80* 10.77*  GLUCOSE 92 89  CALCIUM 8.7 8.8   No results found for this basename: LABPT, INR,  in the last 72 hours  PE:  wn wd male in nad.  Assessment/Plan: 7 Days Post-Op Procedure(s) (LRB): INSERTION OF DIALYSIS CATHETER (Right) Up with therapy  Continue WBAT in boot.  Encouraged pt to ambulate and continue working on active ROM.  HEWITTJonny Ruiz 01/16/2013, 7:30 AM

## 2013-01-16 NOTE — Progress Notes (Signed)
NUTRITION FOLLOW UP  Intervention:   1. Renal diet education provided to son (pt sleeping)  Nutrition Dx:   Inadequate oral intake, improving  Goal:   Meet >/=90% estimated nutrition needs. Improving  Monitor:   Po intake, weight trends, labs, I/O's  Assessment:   Pt diet advanced per SLP. Eating well most meals.  Likely will need to continue HD per notes. RD consulted for diet education. Pt speaks primarily Spanish. Was on Bipap and sleeping at time of RD visit. Son, who speaks English, in room and provided with renal diet information and hand outs.   Height: Ht Readings from Last 1 Encounters:  01/13/13 6' (1.829 m)    Weight Status:   Wt Readings from Last 1 Encounters:  01/15/13 249 lb 12.5 oz (113.3 kg)    Re-estimated needs:  Kcal: 2300-2500 Protein: 120-130 gm  Fluid: 1.2 L   Skin: chest and heel incision, skin tear to groin. Generalized edema.   Diet Order: Cardiac   Intake/Output Summary (Last 24 hours) at 01/16/13 1432 Last data filed at 01/16/13 0950  Gross per 24 hour  Intake   1083 ml  Output      0 ml  Net   1083 ml    Last BM: 8/11   Labs:   Recent Labs Lab 01/10/13 0545  01/14/13 0515 01/15/13 0500 01/16/13 0525  NA 137  < > 135 136 135  K 5.1  < > 5.0 4.6 5.2*  CL 96  < > 94* 96 95*  CO2 21  < > 25 26 23   BUN 118*  < > 72* 47* 68*  CREATININE 8.79*  < > 9.75* 7.80* 10.77*  CALCIUM 8.6  < > 9.0 8.7 8.8  MG 3.2*  --   --   --   --   PHOS 11.5*  < > 8.2* 6.3* 6.4*  GLUCOSE 100*  < > 86 92 89  < > = values in this interval not displayed.  CBG (last 3)   Recent Labs  01/15/13 2201 01/16/13 0751 01/16/13 1222  GLUCAP 166* 88 90    Scheduled Meds: . antiseptic oral rinse  15 mL Mouth Rinse TID WC & HS  . aspirin  325 mg Oral Daily  . calcium acetate  1,334 mg Oral TID WC  . carvedilol  6.25 mg Per NG tube BID WC  . darbepoetin (ARANESP) injection - NON-DIALYSIS  150 mcg Subcutaneous Q Mon-1800  . heparin subcutaneous   5,000 Units Subcutaneous Q8H  . insulin aspart  0-9 Units Subcutaneous TID AC & HS  . lanthanum  1,000 mg Oral TID WC  . multivitamin with minerals  1 tablet Oral Daily  . nystatin   Topical TID  . nystatin-triamcinolone   Topical TID  . pantoprazole  40 mg Oral Daily  . simvastatin  20 mg Oral q1800  . sodium chloride  10-40 mL Intracatheter Q12H    Continuous Infusions: . sodium chloride Stopped (01/01/13 1400)  . sodium chloride Stopped (12/31/12 2300)    Clarene Duke RD, LDN Pager [phone] After Hours pager [phone]

## 2013-01-17 DIAGNOSIS — N19 Unspecified kidney failure: Secondary | ICD-10-CM

## 2013-01-17 LAB — RENAL FUNCTION PANEL
Albumin: 2.7 g/dL — ABNORMAL LOW (ref 3.5–5.2)
Albumin: 2.7 g/dL — ABNORMAL LOW (ref 3.5–5.2)
BUN: 90 mg/dL — ABNORMAL HIGH (ref 6–23)
CO2: 22 mEq/L (ref 19–32)
Chloride: 94 mEq/L — ABNORMAL LOW (ref 96–112)
Chloride: 93 mEq/L — ABNORMAL LOW (ref 96–112)
Creatinine, Ser: 13.77 mg/dL — ABNORMAL HIGH (ref 0.50–1.35)
GFR calc non Af Amer: 4 mL/min — ABNORMAL LOW (ref >90)
Phosphorus: 7.2 mg/dL — ABNORMAL HIGH (ref 2.3–4.6)
Potassium: 5.7 mEq/L — ABNORMAL HIGH (ref 3.5–5.1)
Potassium: 5.2 mEq/L — ABNORMAL HIGH (ref 3.5–5.1)

## 2013-01-17 LAB — CBC
HCT: 28.1 % — ABNORMAL LOW (ref 39.0–52.0)
Hemoglobin: 9.1 g/dL — ABNORMAL LOW (ref 13.0–17.0)
MCV: 95.6 fL (ref 78.0–100.0)
RBC: 2.94 MIL/uL — ABNORMAL LOW (ref 4.22–5.81)
WBC: 12.3 10*3/uL — ABNORMAL HIGH (ref 4.0–10.5)

## 2013-01-17 LAB — GLUCOSE, CAPILLARY
Glucose-Capillary: 82 mg/dL (ref 70–99)
Glucose-Capillary: 80 mg/dL (ref 70–99)
Glucose-Capillary: 88 mg/dL (ref 70–99)

## 2013-01-17 NOTE — Progress Notes (Signed)
PT Cancellation Note  Patient Details Name: Nicholas Potts MRN: [id number] DOB: 27-Jul-1960   Cancelled Treatment:    Reason Eval/Treat Not Completed: Patient at procedure or test/unavailable; was in hemodialysis earlier this am, now down for vein mapping.  Spoke to family member in room regarding walking with assist later if able.  Will attempt tomorrow.   WYNN,CYNDI 01/17/2013, 1:58 PM

## 2013-01-17 NOTE — Procedures (Signed)
I have seen and examined this patient and agree with the plan of care. No apparent recovery of renal function. Hyperkalemia. Using catheter.  Maitri Schnoebelen W 01/17/2013, 8:19 AM

## 2013-01-17 NOTE — Progress Notes (Signed)
[facility] PROGRESS NOTE  CHASYN CINQUE ZOX:[id number] DOB: 07/02/1960 DOA: 12/29/2012 PCP: No primary provider on file.    Brief narrative:  Please refer to detailed  narrative from 8/6., in brief 52 year old hispanic male with recent  right Achilles tendon reconstruction was brought in for apnea due to  Unintentional narcotic overdose.  Patient was found to have respiratory acidosis and hypothermia requiring intubation in the ER. Subsequent course complicated by hypotension, profound bradycardia and became pulseless requiring CPR. Patient then went into V. tach requiring defibrillation and amiodarone. He was stabilized and transferred to ICU. Emergent echo done showed severe wall motion abnormality. He subsequently coded 2 more times and taken to cath lab immediately. Patient found to have severe cardiogenic shock and severely in the dysfunction with EF of 20%. Patient was placed on the impella dynamic support system along with pressors . Patient also developed oliguric renal failure related to shock for which a hemodialysis catheter was placed and CVVHD initiated. Patient had severe encephalopathy secondary to acute insult. Neurology was consulted and had an EEG done which showed moderate to severe generalized nonspecific slowing. Patient's mental status slowly started to improve and after successful extubation he was transferred to step down. Cardiomyopathy has resolved. ARF not resolving and still oliguric and nephrology continues to follow- now likely ESRD. Plan for CIR or SNF after CLIP process complete by Nephrology  Assessment/Plan:  Cardiac and respiratory arrest with ventricular tachycardia - Likely due to Accidental opiate overdose with Aspiration pneumonia  - Patient required ventilatory initially now successfully extubated and stable. - Patient has been empirically treated with 7 days of antibiotics for pneumonia. Cultures negative - Saturating in the mid 90s on room  air.  Cardiogenic shock -required Impella for circulatory support initially  -EF 20% per cath. -nonobstructive CAD noted as well. -Impella device removed 7/26  -2-D echo done on 8/6 showing improvement in EF of 55-60%.  -cont Coreg, and Zocor. Hydralazine DC'd by cardiology due to borderline blood pressures.  - Cardiology signed off. Repeat echo shows full recovery of LV function.  - Tele DCed 8/10  ARF (acute renal failure)  -Likely ATN with circulatory shock & contrast nephropathy -Patient initiated on CVVHD and now getting hemodialysis. Currently anuric.  -hopeful for full renal recovery. Renal following closely.  - Management per nephrology- planning access by VVS and CLIP. Possibly ESRD. - Hyperkalemic 8/12- completed HD  HTN  -Continue carvedilol. Hydralazine discontinued by cardiology due to borderline blood pressures.  Anemia and thrombocytopenia  related to acute shock. Getting Aranesp with dialysis. Anemia stable. Thrombocytopenia resolved.  Recent right Achilles tendon repair   -Seen by Dr. Victorino Dike on 8/6. Sutures removed from right CM cath was recommends weight reading on the right foot as tolerated with heel lift  in place. Patient seen ambulating with PT and rolling walker. Ortho FU appreciated.  OSA -Continue nightly CPAP  Leukocytosis -? Etiology. No clear focus of sepsis. Improving  Hyperglycemia - Hemoglobin A1c 5.6. - Continue SSI and avoid precipitating diet/drinks.  DVT prophylaxis: Subcutaneous heparin  Code Status: Full  Family Communication: Discussed with patient's son  Disposition Plan; Either CIR or SNF in 48 hours after CLIP process completed by Nephrology.  Consultants:  Cardiology  Nephrology  Neurology  Vascular surgery  Orthopedic surgery   Procedures:  Left heart catheterization 12/30/2012  1. Severe cardiogenic shock with severe left ventricular systolic dysfunction  2. Mild nonobstructive coronary artery disease with slow coronary  flow  EEG 01/02/2013  abnormal with  moderately severe generalized nonspecific slowing of cerebral activity, which can be seen with a wide variety of encephalopathic processes. No epileptic activity was recorded.  Removal of right IJ temporary HD catheter and Ultrasound guided placement of right IJ Diatek catheter 01/09/2013   CULTURES:  Sputum 7/25>>>non-pathogenic oropharyngeal-type flora insolated (rare WBC present, mostly PMN)  BCx2 7/24>>>ng  UC 7/24>>>ng  C. Diff 7/29>>>neg  Bronch bal 7/30>>>rare WBC, mostly PMN>>>Staph aureus   Antibiotics:  Vanco 7/24>>>>7/28  Zosyn 7/2/4>>>7/28  Ceftriaxone 7/28>>>7/31>>>8/4>>>8/6  Vanco 8/1>>>8/2   HPI/Subjective:  Denies complaints. Per son, patient feels down after hearing ESRD diagnosis. He was seen at HD this morning.  Objective: Filed Vitals:   01/17/13 1127  BP: 119/71  Pulse:   Temp: 98.6 F (37 C)  Resp: 16    Intake/Output Summary (Last 24 hours) at 01/17/13 1800 Last data filed at 01/17/13 1127  Gross per 24 hour  Intake    360 ml  Output   1100 ml  Net   -740 ml   Filed Weights   01/16/13 2034 01/17/13 0810 01/17/13 1127  Weight: 113.3 kg (249 lb 12.5 oz) 112.8 kg (248 lb 10.9 oz) 111.5 kg (245 lb 13 oz)    Exam:   General: Comfortable. Getting HD  Chest: Clear to auscultation bilaterally, no added sounds, right dialysis catheter. No increased work of breathing.  Cardiovascular: Normal S1 and S2, no murmurs rub or gallop. No pedal edema.   Abdomen: Soft, nontender, nondistended, bowel sounds present,  Extremities: Warm, no edema. No overt lesions seen  CNS: AAO x3, nonfocal   Data Reviewed: Basic Metabolic Panel:  Recent Labs Lab 01/14/13 0515 01/15/13 0500 01/16/13 0525 01/17/13 0545 01/17/13 0830  NA 135 136 135 134* 132*  K 5.0 4.6 5.2* 5.7* 5.2*  CL 94* 96 95* 94* 93*  CO2 25 26 23 22 22   GLUCOSE 86 92 89 88 164*  BUN 72* 47* 68* 89* 90*  CREATININE 9.75* 7.80* 10.77* 13.81* 13.77*   CALCIUM 9.0 8.7 8.8 8.8 8.7  PHOS 8.2* 6.3* 6.4* 7.2* 7.0*   Liver Function Tests:  Recent Labs Lab 01/14/13 0515 01/15/13 0500 01/16/13 0525 01/17/13 0545 01/17/13 0830  ALBUMIN 2.8* 2.7* 2.7* 2.7* 2.7*   No results found for this basename: LIPASE, AMYLASE,  in the last 168 hours No results found for this basename: AMMONIA,  in the last 168 hours CBC:  Recent Labs Lab 01/11/13 0500 01/14/13 0500 01/15/13 0500 01/17/13 0808  WBC 17.3* 14.1* 13.0* 12.3*  HGB 9.8* 9.9* 9.8* 9.1*  HCT 29.4* 31.2* 30.8* 28.1*  MCV 95.8 98.1 98.7 95.6  PLT 384 432* 425* 384   Cardiac Enzymes: No results found for this basename: CKTOTAL, CKMB, CKMBINDEX, TROPONINI,  in the last 168 hours BNP (last 3 results)  Recent Labs  12/30/12 0500  PROBNP 3296.0*   CBG:  Recent Labs Lab 01/16/13 1808 01/16/13 2038 01/17/13 0730 01/17/13 1209 01/17/13 1718  GLUCAP 106* 100* 82 80 88    No results found for this or any previous visit (from the past 240 hour(s)).   Studies: No results found.  Scheduled Meds: . antiseptic oral rinse  15 mL Mouth Rinse TID WC & HS  . aspirin  325 mg Oral Daily  . calcium acetate  1,334 mg Oral TID WC  . carvedilol  6.25 mg Per NG tube BID WC  . darbepoetin (ARANESP) injection - NON-DIALYSIS  150 mcg Subcutaneous Q Mon-1800  . heparin subcutaneous  5,000 Units Subcutaneous  Q8H  . insulin aspart  0-9 Units Subcutaneous TID AC & HS  . lanthanum  1,000 mg Oral TID WC  . multivitamin with minerals  1 tablet Oral Daily  . nystatin   Topical TID  . nystatin-triamcinolone   Topical TID  . pantoprazole  40 mg Oral Daily  . simvastatin  20 mg Oral q1800   Continuous Infusions: . sodium chloride Stopped (01/01/13 1400)  . sodium chloride Stopped (12/31/12 2300)      Time spent: 20 minutes    [facility]  [facility] Pager [phone]. If 7PM-7AM, please contact night-coverage at [email or web address], password [facility] 01/17/2013, 6:00 PM  LOS: 19 days

## 2013-01-17 NOTE — Progress Notes (Signed)
Patient not wearing CPAP at this time.  Stated that when he was ready for bed that he could place himself on. RT will continue to monitor.

## 2013-01-17 NOTE — Progress Notes (Signed)
Patient ID: Nicholas Potts, male   DOB: 27-Aug-1960, 52 y.o.   MRN: [id number] Patient was seen in consult and had hemodialysis catheter placed last week. We have not been consulted to place AV fistula next week if renal function continues to not improve. Await vein mapping.

## 2013-01-17 NOTE — Progress Notes (Signed)
OT Cancellation Note  Patient Details Name: Nicholas Potts MRN: [id number] DOB: [birth year]   Cancelled Treatment:    Reason Eval/Treat Not Completed: Patient at procedure or test/ unavailable--hemodialysis.  Evette Georges [phone] 01/17/2013, 10:06 AM

## 2013-01-17 NOTE — Progress Notes (Signed)
Patient has already placed himself on the Auto Bipap machine.  Patient is using a nasal mask with 2L of oxygen bled in.  Pt resting comfortably.  RT will continue to monitor.

## 2013-01-17 NOTE — Progress Notes (Signed)
Utilization review completed.  

## 2013-01-17 NOTE — Progress Notes (Signed)
Right  Upper Extremity Vein Map    Cephalic  Segment Diameter Depth Comment  1. Axilla 2.46mm    2. Mid upper arm 4.72mm  Branch  3. Above AC 5mm    4. In [facility] 7.50mm    5. Below AC 3.32mm    6. Mid forearm 2.88mm    7. Wrist 2.57mm                     Left Upper Extremity Vein Map    Cephalic  Segment Diameter Depth Comment  1. Axilla 3.60mm    2. Mid upper arm 3.44mm    3. Above [facility] 3.62mm    4. In [facility] 4.40mm    5. Below AC 4.55mm    6. Mid forearm 3.60mm    7. Wrist 3.76mm  Branch                   01/17/2013 3:08 PM Gertie Fey, RVT, RDCS, RDMS

## 2013-01-17 NOTE — Progress Notes (Signed)
Continuing to follow pt for possible CIR.  Pt is progressing in PT-will see how he does w/ OT [thanks for the order].  Await completion of vein mapping & clipping process [?when might VVS place access?].  Have discussed w/ pt's CM, Annette.  Also, spoke w/ pt's daughter, [name], this morning.  [phone]

## 2013-01-18 DIAGNOSIS — B353 Tinea pedis: Secondary | ICD-10-CM | POA: Diagnosis present

## 2013-01-18 LAB — RENAL FUNCTION PANEL
CO2: 29 mEq/L (ref 19–32)
Calcium: 8.7 mg/dL (ref 8.4–10.5)
GFR calc Af Amer: 5 mL/min — ABNORMAL LOW (ref >90)
GFR calc non Af Amer: 5 mL/min — ABNORMAL LOW (ref >90)
Glucose, Bld: 84 mg/dL (ref 70–99)
Phosphorus: 6.2 mg/dL — ABNORMAL HIGH (ref 2.3–4.6)
Potassium: 5.5 mEq/L — ABNORMAL HIGH (ref 3.5–5.1)
Sodium: 135 mEq/L (ref 135–145)

## 2013-01-18 LAB — GLUCOSE, CAPILLARY
Glucose-Capillary: 95 mg/dL (ref 70–99)
Glucose-Capillary: 163 mg/dL — ABNORMAL HIGH (ref 70–99)

## 2013-01-18 MED ORDER — HYDROXYZINE HCL 25 MG PO TABS
25.0000 mg | ORAL_TABLET | Freq: Three times a day (TID) | ORAL | Status: DC | PRN
  Administered 2013-01-18: 25 mg via ORAL
  Filled 2013-01-18 (×2): qty 1

## 2013-01-18 MED ORDER — KETOCONAZOLE 2 % EX CREA
TOPICAL_CREAM | Freq: Two times a day (BID) | CUTANEOUS | Status: DC
  Administered 2013-01-23 – 2013-01-27 (×7): via TOPICAL
  Filled 2013-01-18: qty 15

## 2013-01-18 NOTE — Progress Notes (Signed)
Inpatient rehab bed is not available today. I have discussed with SW. [phone]

## 2013-01-18 NOTE — Progress Notes (Addendum)
Vein mapping reviewed - pt has adequate Cephalic vein blalt. Left forearm cephalic with diameters above 3mm so may be candidate for radiocephalic AVF if pt renal function does not improve and perm access needed.   SCR down to 11.18 (8/13) from 13.77  ROCZNIAK,REGINA J 8:36 AM 01/18/2013 I have examined the patient, reviewed and agree with above.  Jovannie Ulibarri, MD 01/18/2013 12:27 PM

## 2013-01-18 NOTE — Progress Notes (Signed)
Patient's daughter expressing concern that patient ended up in the hospital "because he didn't have a CPAP." Concern that patient will be discharged without CPAP machine or sleep study. RN made aware. Attempt will be made to have MD communicate with patient and family.

## 2013-01-18 NOTE — Progress Notes (Signed)
01/18/2013 11:52 AM Hemodialysis Outpatient Note; this patient has been accepted at the [facility] dialysis on a Tues/Thurs/Sat 2nd shift schedule. His first day will be Thursday August 14th and he will need to arrive at the center for 11:30 AM to sign paperwork.  Thank you. Nicholas Potts

## 2013-01-18 NOTE — Progress Notes (Signed)
Pt continues with flat affect. MD verbally notified about concerns for ?psych consult. Will continue to monitor.

## 2013-01-18 NOTE — Progress Notes (Signed)
Hypoglycemic Event  CBG: 65  Treatment: 15 GM carbohydrate snack  Symptoms: None  Follow-up CBG: Time:0800 CBG Result:138   Possible Reasons for Event: Unknown  Comments/MD notified:no    Nicholas Potts D  Remember to initiate Hypoglycemia Order Set & complete

## 2013-01-18 NOTE — Progress Notes (Signed)
Physical Therapy Treatment Patient Details Name: Nicholas Potts MRN: [id number] DOB: 08/19/1960 Today's Date: 01/18/2013 Time: 1308-6578 PT Time Calculation (min): 13 min  PT Assessment / Plan / Recommendation  History of Present Illness 52 year old male for evaluation of shock. The patient was intubated and had been coded 3 separate times. The patient had repair of his Achilles tendon 2 days prior to admission. He apparently had some difficulties with respiration postoperatively but otherwise did well. He has been taking pain medication as instructed based on his family's report. He was last seen lucid at 9 AM and at approximately 11:30 AM his family found him unresponsive and "white foam coming from his mouth". EMS was called and he was brought to the emergency room where he suffered cardiac arrest 3 separate times. There is note of PEA arrest.   PT Comments   Pt able to tolerate increase in distance today without a rest break.  Follow Up Recommendations  CIR     Does the patient have the potential to tolerate intense rehabilitation     Barriers to Discharge        Equipment Recommendations  Rolling walker with 5" wheels    Recommendations for Other Services    Frequency     Progress towards PT Goals Progress towards PT goals: Progressing toward goals  Plan Current plan remains appropriate    Precautions / Restrictions Precautions Precautions: Fall Required Braces or Orthoses: Other Brace/Splint Other Brace/Splint: CAM boot with heel lift Restrictions RLE Weight Bearing: Weight bearing as tolerated Other Position/Activity Restrictions: CAM boot with heel lift for WBAT   Pertinent Vitals/Pain States no pain with ambulation    Mobility  Bed Mobility Bed Mobility: Supine to Sit;Sit to Supine Supine to Sit: 5: Supervision;With rails;HOB elevated Sit to Supine: 5: Supervision;HOB elevated Details for Bed Mobility Assistance: Needs increased time.  Required assist to  don/doff CAM boot. Transfers Transfers: Sit to Stand;Stand to Sit Sit to Stand: With upper extremity assist;From bed;4: Min guard Stand to Sit: With upper extremity assist;To bed;4: Min guard Details for Transfer Assistance: verbal cues for hand placement Ambulation/Gait Ambulation/Gait Assistance: 4: Min guard Ambulation Distance (Feet): 80 Feet Assistive device: Rolling walker Ambulation/Gait Assistance Details: reports no pain ambulating today and able to tolerate a greater distance without any rest breaks Gait Pattern: Step-to pattern;Trunk flexed;Decreased stance time - right Stairs: No Wheelchair Mobility Wheelchair Mobility: No    Exercises     PT Diagnosis:    PT Problem List:   PT Treatment Interventions:     PT Goals (current goals can now be found in the care plan section)    Visit Information  Last PT Received On: 01/18/13 Assistance Needed: +1 History of Present Illness: 52 year old male for evaluation of shock. The patient was intubated and had been coded 3 separate times. The patient had repair of his Achilles tendon 2 days prior to admission. He apparently had some difficulties with respiration postoperatively but otherwise did well. He has been taking pain medication as instructed based on his family's report. He was last seen lucid at 9 AM and at approximately 11:30 AM his family found him unresponsive and "white foam coming from his mouth". EMS was called and he was brought to the emergency room where he suffered cardiac arrest 3 separate times. There is note of PEA arrest.    Subjective Data      Cognition  Cognition Arousal/Alertness: Awake/alert Behavior During Therapy: WFL for tasks assessed/performed Overall Cognitive Status: Within Functional Limits  for tasks assessed    Balance     End of Session PT - End of Session Equipment Utilized During Treatment: Other (comment) (CAM boot) Activity Tolerance: Patient tolerated treatment well Patient left:  with call bell/phone within reach;with family/visitor present;in bed   GP     Nicholas Potts,KATHrine E 01/18/2013, 4:36 PM Zenovia Jarred, PT, DPT 01/18/2013 Pager: [phone]

## 2013-01-18 NOTE — Progress Notes (Signed)
Patient ID: Nicholas Potts, male   DOB: 1961-05-23, 52 y.o.   MRN: [id number] Remains on hemodialysis. We have been requested to place permanent AV access Maryclare Nydam next week. The patient's son is present who is fluent in [place] and [place]. I explained the need for long-term access for hemodialysis. His vein map did show a nice cephalic vein on the left arm. He is right-handed. We will plan left Cimino AV fistula Elden Brucato next week. Explain the procedure to include minimum of 3 months need for maturation and possible non-maturation.

## 2013-01-18 NOTE — Progress Notes (Signed)
[facility] ROUNDING NOTE   Subjective:   Interval History: appears a little low in spirits today  Objective:  Vital signs in last 24 hours:  Temp:  [98.5 F (36.9 C)-99.3 F (37.4 C)] 99 F (37.2 C) (08/13 0850) Pulse Rate:  [73-83] 73 (08/13 0850) Resp:  [16-18] 18 (08/13 0850) BP: (112-120)/(57-71) 115/61 mmHg (08/13 0850) SpO2:  [94 %-100 %] 100 % (08/13 0850) Weight:  [111.5 kg (245 lb 13 oz)] 111.5 kg (245 lb 13 oz) (08/12 2022)  Weight change: -0.5 kg (-1 lb 1.6 oz) Filed Weights   01/17/13 0810 01/17/13 1127 01/17/13 2022  Weight: 112.8 kg (248 lb 10.9 oz) 111.5 kg (245 lb 13 oz) 111.5 kg (245 lb 13 oz)    Intake/Output: I/O last 3 completed shifts: In: 480 [P.O.:480] Out: 1200 [Urine:200; Other:1000]   Intake/Output this shift:     CVS- RRR RS- CTA ABD- BS present soft non-distended EXT- no edema   Basic Metabolic Panel:  Recent Labs Lab 01/15/13 0500 01/16/13 0525 01/17/13 0545 01/17/13 0830 01/18/13 0602  NA 136 135 134* 132* 135  K 4.6 5.2* 5.7* 5.2* 5.5*  CL 96 95* 94* 93* 95*  CO2 26 23 22 22 29   GLUCOSE 92 89 88 164* 84  BUN 47* 68* 89* 90* 58*  CREATININE 7.80* 10.77* 13.81* 13.77* 11.18*  CALCIUM 8.7 8.8 8.8 8.7 8.7  PHOS 6.3* 6.4* 7.2* 7.0* 6.2*    Liver Function Tests:  Recent Labs Lab 01/15/13 0500 01/16/13 0525 01/17/13 0545 01/17/13 0830 01/18/13 0602  ALBUMIN 2.7* 2.7* 2.7* 2.7* 2.7*   No results found for this basename: LIPASE, AMYLASE,  in the last 168 hours No results found for this basename: AMMONIA,  in the last 168 hours  CBC:  Recent Labs Lab 01/14/13 0500 01/15/13 0500 01/17/13 0808  WBC 14.1* 13.0* 12.3*  HGB 9.9* 9.8* 9.1*  HCT 31.2* 30.8* 28.1*  MCV 98.1 98.7 95.6  PLT 432* 425* 384    Cardiac Enzymes: No results found for this basename: CKTOTAL, CKMB, CKMBINDEX, TROPONINI,  in the last 168 hours  BNP: No components found with this basename: POCBNP,   CBG:  Recent Labs Lab  01/17/13 1209 01/17/13 1718 01/17/13 2025 01/18/13 0731 01/18/13 0818  GLUCAP 80 88 155* 65* 138*    Microbiology: Results for orders placed during the hospital encounter of 12/29/12  URINE CULTURE     Status: None   Collection Time    12/29/12  3:08 PM      Result Value Range Status   Specimen Description URINE, CATHETERIZED   Final   Special Requests NONE   Final   Culture  Setup Time 12/29/2012 23:00   Final   Colony Count NO GROWTH   Final   Culture NO GROWTH   Final   Report Status 12/30/2012 FINAL   Final  CULTURE, BLOOD (ROUTINE X 2)     Status: None   Collection Time    12/29/12  3:27 PM      Result Value Range Status   Specimen Description BLOOD HAND LEFT   Final   Special Requests BOTTLES DRAWN AEROBIC ONLY 5CC   Final   Culture  Setup Time 12/29/2012 22:48   Final   Culture NO GROWTH 5 DAYS   Final   Report Status 01/04/2013 FINAL   Final  CULTURE, BLOOD (ROUTINE X 2)     Status: None   Collection Time    12/29/12  3:37 PM  Result Value Range Status   Specimen Description BLOOD ARM LEFT   Final   Special Requests BOTTLES DRAWN AEROBIC ONLY 2CC   Final   Culture  Setup Time 12/29/2012 22:48   Final   Culture NO GROWTH 5 DAYS   Final   Report Status 01/04/2013 FINAL   Final  URINE CULTURE     Status: None   Collection Time    12/29/12  7:11 PM      Result Value Range Status   Specimen Description URINE, CATHETERIZED   Final   Special Requests NONE   Final   Culture  Setup Time 12/29/2012 21:09   Final   Colony Count NO GROWTH   Final   Culture NO GROWTH   Final   Report Status 12/31/2012 FINAL   Final  MRSA PCR SCREENING     Status: None   Collection Time    12/30/12  2:24 AM      Result Value Range Status   MRSA by PCR NEGATIVE  NEGATIVE Final   Comment:            The GeneXpert MRSA Assay (FDA     approved for NASAL specimens     only), is one component of a     comprehensive MRSA colonization     surveillance program. It is not     intended  to diagnose MRSA     infection nor to guide or     monitor treatment for     MRSA infections.  CULTURE, RESPIRATORY (NON-EXPECTORATED)     Status: None   Collection Time    12/30/12 11:51 AM      Result Value Range Status   Specimen Description TRACHEAL ASPIRATE   Final   Special Requests NONE   Final   Gram Stain     Final   Value: RARE WBC PRESENT, PREDOMINANTLY PMN     NO SQUAMOUS EPITHELIAL CELLS SEEN     NO ORGANISMS SEEN   Culture Non-Pathogenic Oropharyngeal-type Flora Isolated.   Final   Report Status 01/02/2013 FINAL   Final  CLOSTRIDIUM DIFFICILE BY PCR     Status: None   Collection Time    01/03/13  4:35 PM      Result Value Range Status   C difficile by pcr NEGATIVE  NEGATIVE Final  CULTURE, BAL-QUANTITATIVE     Status: None   Collection Time    01/04/13  3:35 PM      Result Value Range Status   Specimen Description BRONCHIAL ALVEOLAR LAVAGE   Final   Special Requests BRONCH WASH LL LOBE    Final   Gram Stain     Final   Value: RARE WBC PRESENT, PREDOMINANTLY PMN     NO SQUAMOUS EPITHELIAL CELLS SEEN     NO ORGANISMS SEEN   Colony Count 30,000 COLONIES/ML   Final   Culture     Final   Value: STAPHYLOCOCCUS AUREUS     Note: RIFAMPIN AND GENTAMICIN SHOULD NOT BE USED AS SINGLE DRUGS FOR TREATMENT OF STAPH INFECTIONS.   Report Status 01/07/2013 FINAL   Final   Organism ID, Bacteria STAPHYLOCOCCUS AUREUS   Final    Coagulation Studies: No results found for this basename: LABPROT, INR,  in the last 72 hours  Urinalysis: No results found for this basename: COLORURINE, APPERANCEUR, LABSPEC, PHURINE, GLUCOSEU, HGBUR, BILIRUBINUR, KETONESUR, PROTEINUR, UROBILINOGEN, NITRITE, LEUKOCYTESUR,  in the last 72 hours    Imaging: No results found.  Medications:   . sodium chloride Stopped (01/01/13 1400)  . sodium chloride Stopped (12/31/12 2300)   . antiseptic oral rinse  15 mL Mouth Rinse TID WC & HS  . aspirin  325 mg Oral Daily  . calcium acetate  1,334 mg  Oral TID WC  . carvedilol  6.25 mg Per NG tube BID WC  . darbepoetin (ARANESP) injection - NON-DIALYSIS  150 mcg Subcutaneous Q Mon-1800  . heparin subcutaneous  5,000 Units Subcutaneous Q8H  . insulin aspart  0-9 Units Subcutaneous TID AC & HS  . lanthanum  1,000 mg Oral TID WC  . multivitamin with minerals  1 tablet Oral Daily  . nystatin   Topical TID  . nystatin-triamcinolone   Topical TID  . pantoprazole  40 mg Oral Daily  . simvastatin  20 mg Oral q1800   sodium chloride, feeding supplement (NEPRO CARB STEADY), fentaNYL, heparin, heparin, hydrALAZINE, lidocaine (PF), lidocaine-prilocaine, ondansetron (ZOFRAN) IV, oxyCODONE, pentafluoroprop-tetrafluoroeth, traMADol  Assessment/ Plan:  1. AKI 2/2 ATN [unclear baseline] / cardiogenic shock, arrest, and contrast nephropathy: Started HD 7/25. Remains anuric. Appreciate Dr Early help 2. Cardiogenic shock; per cards, improved  3. Hypoxic RF; resolved  4. Anemia; on aranesp, follow  5. MSSA on BAL  6. Hyperphosphatemia   I would prepare patient for ESRD management. No return of renal function anticipated soon. Will need placement of AVF early next week with discharge plans to [facility] TTS    LOS: 20 Nicholas Potts @TODAY @11 :04 AM

## 2013-01-18 NOTE — Discharge Summary (Addendum)
Physician Discharge Summary  Nicholas Potts ZOX:[id number] DOB: 1961-02-21 DOA: 12/29/2012  PCP: No primary provider on file.  Admit date: 12/29/2012 Discharge date: 01/18/2013  Time spent:40 minutes  Recommendations for Outpatient Follow-up:  1. Cardiogenic shock/acute CHF NYHA class III; resolved, echocardiogram from 01/11/2013 shows grade 1 diastolic dysfunction.    2. HTN; within AHA guidelines no change the meds require  3. Acute renal failure/ATN; now on hemodialysis T/TH/Sat; per   Dr Hyman Hopes (Nephrologist) I would prepare patient for ESRD management. No return of renal function anticipated soon. Will need placement of AVF early next week with discharge plans to [facility] TTS. Counseled patient and son that they were more than likely be here until at least Sunday  4. Accidental opiate poisoning; was on  5. Tinea Pedis; Added Ketoconazole and Pumice stone for Lt Foot, Hydroxyazine for pruritis.    Discharge Diagnoses:  Principal Problem:   Acute respiratory failure Active Problems:   Cardiogenic shock   Acute renal failure (ARF)   Cardiac and respiratory arrest with VT due to:   Aspiration pneumonia   Accidental opiate poisoning   ARF (acute renal failure) with tubular necrosis requiring HD   Acute systolic congestive heart failure, NYHA class 3   Anemia   Thrombocytopenia, unspecified   HTN (hypertension)   Discharge Condition:   Diet recommendation:   Filed Weights   01/17/13 0810 01/17/13 1127 01/17/13 2022  Weight: 112.8 kg (248 lb 10.9 oz) 111.5 kg (245 lb 13 oz) 111.5 kg (245 lb 13 oz)    History of present illness:  2- yo HM PMHx Rt Achilles tendon reconstruction was brought in for apnea due to Unintentional narcotic overdose. Patient was found to have respiratory acidosis and hypothermia requiring intubation in the ER. Subsequent course complicated by hypotension, profound bradycardia and became pulseless requiring CPR. Patient then went  into V. tach requiring defibrillation and amiodarone. He was stabilized and transferred to ICU. Emergent echo done showed severe wall motion abnormality. He subsequently coded 2 more times and taken to cath lab immediately. Patient found to have severe cardiogenic shock and severely in the dysfunction with EF of 20%. Patient was placed on the impella dynamic support system along with pressors . Patient also developed oliguric renal failure related to shock for which a hemodialysis catheter was placed and CVVHD initiated. Patient had severe encephalopathy secondary to acute insult. Neurology was consulted and had an EEG done which showed moderate to severe generalized nonspecific slowing. Patient's mental status slowly started to improve and after successful extubation he was transferred to step down. Cardiomyopathy has resolved. ARF not resolving and still oliguric and nephrology continues to follow.   Hospital Course:    Procedures:  Echocardiogram 01/11/2013 - Left ventricle: The cavity size was normal. Wall thickness was normal. Systolic function was normal. LVEF= 55% to 60%.  Doppler parameters are consistent with abnormal left ventricular relaxation (grade 1 diastolic dysfunction). - Aortic valve: There was no stenosis. - Mitral valve: No significant regurgitation. - Left atrium: The atrium was mildly dilated. - Right ventricle: The cavity size was normal. Systolic function was normal.   Consultations:  Nephrology, orthopedics, vascular surgery, cardiology, critical care  Discharge Exam: Filed Vitals:   01/17/13 1235 01/17/13 1605 01/17/13 2022 01/18/13 0444  BP: 120/71 116/68 112/60 118/57  Pulse: 73 75 83 79  Temp: 98.7 F (37.1 C) 98.6 F (37 C) 98.5 F (36.9 C) 99.3 F (37.4 C)  TempSrc: Oral Oral Oral Oral  Resp:  16 18 18 18   Height:      Weight:   111.5 kg (245 lb 13 oz)   SpO2: 98% 99% 98% 96%    General: Alert, NAD Cardiovascular: Regular rhythm and rate,  negative murmurs rubs or gallops, DP/TP pulses 2+ bilateral Respiratory: Clear to auscultation bilateral Muscle skeletal; left plantar surface of foot showed significant tinea pedis, right Achilles tendon repair no sign of infection Steri-Strips still in place, right chest wall Vas-Cath no sign of infection  Discharge Instructions     Medication List    ASK your doctor about these medications       aspirin 325 MG tablet  Take 325 mg by mouth daily.     meloxicam 15 MG tablet  Commonly known as:  MOBIC  Take 15 mg by mouth daily.     ONE-A-DAY 50 PLUS Tabs  Take 1 tablet by mouth daily.     oxycodone 5 MG capsule  Commonly known as:  OXY-IR  Take 10 mg by mouth every 4 (four) hours as needed for pain.     simvastatin 20 MG tablet  Commonly known as:  ZOCOR  Take 20 mg by mouth daily.     traMADol 50 MG tablet  Commonly known as:  ULTRAM  Take 50 mg by mouth daily.       No Known Allergies    The results of significant diagnostics from this hospitalization (including imaging, microbiology, ancillary and laboratory) are listed below for reference.    Significant Diagnostic Studies: Ct Head Wo Contrast  12/29/2012   *RADIOLOGY REPORT*  Clinical Data: Altered mental status.  Possible overdose.  CT HEAD WITHOUT CONTRAST  Technique:  Contiguous axial images were obtained from the base of the skull through the vertex without contrast.  Comparison: Head CT 12/10/2007.  Findings: There is no evidence of acute intracranial hemorrhage, mass lesion, brain edema or extra-axial fluid collection.  The ventricles and subarachnoid spaces are appropriately sized for age. There is no CT evidence of acute cortical infarction.  There is diffuse mucosal thickening throughout the nasal passages, ethmoid and maxillary sinuses.  The nasopharyngeal airway is closed at the time of imaging (the patient is intubated).  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  The mastoids and  middle ears are clear. The calvarium is intact.  IMPRESSION:  1.  No acute intracranial or calvarial findings identified. 2.  Diffuse mucosal thickening throughout the visualized nasal passages and paranasal sinuses. Significant maxillary periodontal disease bilaterally.   Original Report Authenticated By: Carey Bullocks, M.D.   Dg Chest Port 1 View  01/11/2013   *RADIOLOGY REPORT*  Clinical Data: Shortness of breath.  Pneumonia.  PORTABLE CHEST - 1 VIEW  Comparison: Plain film chest 01/08/2013 and 01/09/2013.  Findings: Dialysis catheter remains in place.  Lung volumes are low.  Mild left basilar atelectasis is identified.  There is no pulmonary edema.  No pneumothorax or pleural fluid is identified. Heart size is enlarged.  IMPRESSION:  Mild subsegmental atelectasis left lung base.  No acute abnormality.   Original Report Authenticated By: Holley Dexter, M.D.   Dg Chest Port 1 View  01/09/2013   *RADIOLOGY REPORT*  Clinical Data: Central catheter placement  PORTABLE CHEST - 1 VIEW  Comparison: Study obtained earlier in the day  Findings: Dual lumen catheter tip is at the cavoatrial junction. Left-sided catheter tip is in the right atrium.  No pneumothorax.  There is mild atelectasis in the left base.  Elsewhere, lungs are clear.  Heart is upper normal in size with normal pulmonary vascularity.  No adenopathy.  IMPRESSION: Catheters as described without pneumothorax.  Mild left base atelectasis.   Original Report Authenticated By: Bretta Bang, M.D.   Dg Chest Port 1 View  01/09/2013   *RADIOLOGY REPORT*  Clinical Data: Follow up pneumonia  PORTABLE CHEST - 1 VIEW  Comparison: Prior radiograph from 01/08/2013  Findings: The the patient has been extubated in the interim.  Right IJ Cordis catheter is stable in position.  Enteric tube is been removed.  Cardiac silhouette is unchanged.  The lungs remain hypoinflated.  There is been interval improvement in aeration of both lung bases with improved bibasilar  opacities. Veiling opacity overlying the costophrenic angles are consistent with small pleural effusions, slightly larger on the left.  Osseous structures are unchanged.  IMPRESSION: 1.  Interval improvement in aeration of both lung bases with improved bibasilar opacities status post extubation. Particularly, dense opacity at the left lung base is improved, which may represent improved atelectasis, pleural fluid,  and / or infiltrate. 2.  Small bilateral pleural effusions, left greater than right.   Original Report Authenticated By: Rise Mu, M.D.   Dg Chest Port 1 View  01/08/2013   *RADIOLOGY REPORT*  Clinical Data: Follow up pneumonia  PORTABLE CHEST - 1 VIEW  Comparison: Prior chest x-ray 01/07/2013  Findings: The endotracheal tube is 3.5 cm above the carina.  Left subclavian approach central venous catheter with the tip at the superior cavoatrial junction.  The right IJ approach temporary hemodialysis catheter is noted with the tip in the upper SVC. Visualized nasogastric tube in unchanged position.  Cardiac and mediastinal contours are unchanged.  Inspiratory volumes remain very low and there is left basilar atelectasis versus infiltrate. Small right pleural effusion is improving and there is improved aeration in the right base.  IMPRESSION:  1.  Stable and satisfactory support apparatus. 2.  Similar appearance of dense left basilar opacity which may represent a combination of pleural fluid with atelectasis and / or infiltrate. 3.  Improved aeration in the right base with decreasing pleural fluid and opacity.   Original Report Authenticated By: Malachy Moan, M.D.   Dg Chest Port 1 View  01/07/2013   *RADIOLOGY REPORT*  Clinical Data: Assess infiltrate  PORTABLE CHEST - 1 VIEW  Comparison: Prior radiograph from 01/06/2013  Findings: The the patient remains intubated with the tip of the endotracheal tube located 2.9 cm above the carina.  Enteric tube courses into the abdomen, and appears  coiled within the gastric fundus, similar to prior. Right IJ central venous catheter is unchanged.  Left subclavian central venous catheter is in stable position with tip at cavoatrial junction.  Heart size is unchanged.  The lungs remain hypoinflated.  Bilateral layering pleural effusions persist, likely capping the right lung apex.  Associated bibasilar airspace opacities are also not significantly changed, which may read represent atelectasis or infiltrate.  No overt pulmonary edema.  No pneumothorax.  The bony structures are intact.  IMPRESSION: 1.  Support apparatus as above.  Tip of endotracheal tube 2.9 cm above the carina. 2.  Similar appearance of the chest with persistent hypoinflation with bilateral pleural effusions and associated bibasilar airspace opacities, which may reflect atelectasis or infiltrate.   Original Report Authenticated By: Rise Mu, M.D.   Dg Chest Port 1 View  01/06/2013   *RADIOLOGY REPORT*  Clinical Data: Pleural effusions.  PORTABLE CHEST - 1 VIEW  Comparison: 01/05/2013.  Findings: Endotracheal tube ends 2  cm above the carina.  Gastric suction tube is coiled in the stomach.  The right IJ sheath has been retracted slightly, still in good position at the level of the upper SVC. Left subclavian central venous catheter, tip at the level of the superior cavoatrial junction.  Unchanged heart size and mediastinal contours, distorted by low lung volumes.  Layering bilateral pleural effusions, likely capping the apical lungs.  There is likely also a lung opacity at the left base. No evident pneumothorax.  IMPRESSION:  1.  Satisfactory positioning of tubes and lines.  Please note that the right IJ catheter has been retracted since yesterday. 2. Low volume lungs with bilateral pleural effusions and left base atelectasis or infiltrate.   Original Report Authenticated By: Tiburcio Pea   Dg Chest Port 1 View  01/05/2013   *RADIOLOGY REPORT*  Clinical Data: Atelectasis.  PORTABLE  CHEST - 1 VIEW  Comparison: 01/04/2013.  Findings: The support apparatus is unchanged.  Endotracheal tube tip is 29 mm from the carina.  Pulmonary aeration is improved compared yesterday's exam with decreasing basilar atelectasis. Positional shift in the pleural effusions.  Pulmonary vascular congestion persists.  Cardiopericardial silhouette grossly appears unchanged.  IMPRESSION:  1.  Stable support apparatus. 2.  Improving pulmonary aeration with positional shift in the pleural effusions.  Overall low volume chest.   Original Report Authenticated By: Andreas Newport, M.D.   Dg Chest Port 1 View  01/04/2013   *RADIOLOGY REPORT*  Clinical Data: Evaluate endotracheal tube position.  Infiltrate and atelectasis.  PORTABLE CHEST - 1 VIEW  Comparison: 01/03/2013.  Findings: Endotracheal tube tip is 28 mm from the carina.  Right IJ vas cath.  Enteric tube is present with the tip not visualized. There is probably a loop of the enteric tube in the proximal fundus of the stomach.  Markedly low lung volumes remain present with basilar atelectasis and diffuse bilateral basilar predominant airspace disease most compatible with pulmonary edema.  Bilateral pleural effusions are also present.  Left subclavian central line appears similar with the tip in the mid-to-lower SVC.  IMPRESSION:  1.  Endotracheal tube tip 28 mm from the carina. 2.  Other support apparatus unchanged. 3.  Unchanged pulmonary aeration with bilateral airspace disease/edema, pleural effusions and atelectasis.   Original Report Authenticated By: Andreas Newport, M.D.   Dg Chest Port 1 View  01/03/2013   *RADIOLOGY REPORT*  Clinical Data: Endotracheal tube placement  PORTABLE CHEST - 1 VIEW  Comparison: Prior chest x-ray 01/02/2013  Findings: The tip of the endotracheal tube is 1.5 cm above the carina.  The patient is in a kyphotic position.  The right IJ temporary hemodialysis catheter tip projects over the distal SVC. The tip of the left subclavian  approach central venous catheter projects over the superior cavoatrial junction. The nasogastric tube can be seen coiled within the gastric fundus.  Inspiratory volumes are very low.  There is mild pulmonary edema and diffuse bilateral interstitial and airspace opacities.  Probable bilateral layering effusions.  Stable cardiomegaly.  IMPRESSION:  1.  The tip of the endotracheal tube is 1.5 cm above the carina. Relatively distal placement of the endotracheal tube likely exaggerated by the patient is kyphotic positioning. 2.  Other support apparatus in stable and satisfactory position. 3.  Similar appearance of the lungs with very low inspiratory volumes, edema, bilateral layering effusions and infiltrate versus atelectasis.   Original Report Authenticated By: Malachy Moan, M.D.   Dg Chest Port 1 View  01/02/2013   *RADIOLOGY  REPORT*  Clinical Data: Endotracheal tube position  PORTABLE CHEST - 1 VIEW  Comparison: 01/01/2013  Findings: Endotracheal tube is appropriately positioned.  Insert NG right IJ central line tip terminates over the distal SVC.  Mild cardiomegaly process of central vascular congestion and small pleural effusions obscuring lung bases.  IMPRESSION: Support apparatus as above.  Central vascular congestion with small pleural effusions.   Original Report Authenticated By: Christiana Pellant, M.D.   Dg Chest Port 1 View  01/01/2013   *RADIOLOGY REPORT*  Clinical Data: Hypoxia  PORTABLE CHEST - 1 VIEW  Comparison: December 31, 2012  Findings:  Endotracheal tube tip is 2.5 cm above the carina. Central catheters are unchanged in position. Nasogastric tube tip and side port are in the stomach region.  No apparent pneumothorax.  There is consolidation in the left lower lobe with small left effusion.  The right lung is clear.  Heart is borderline prominent with normal pulmonary vascularity.  No appreciable adenopathy.  IMPRESSION:  Tube and catheter positions as described.  No pneumothorax. There is  consolidation in the left lower lobe with small left effusion.  Right lung clear.   Original Report Authenticated By: Bretta Bang, M.D.   Dg Chest Port 1 View  12/31/2012   *RADIOLOGY REPORT*  Clinical Data: Critically ill patient with possible change in position of one of the indwelling support apparatus, the Impella device.  PORTABLE CHEST - 1 VIEW 12/31/2012 1141 hours:  Comparison: Portable chest x-ray earlier same date 0608 hours.  Findings: The Impella device which has been placed from a femoral approach is unchanged in position since the examination earlier today, with the distal hook in the left ventricle and the device traversing the aortic valve.  Endotracheal tube tip somewhat low, chest approximately 2 cm above the carina, as noted previously. Remaining support apparatus satisfactory with the Swan-Ganz catheter tip overlying the proximal right main pulmonary artery, the right jugular central venous catheter tip overlying the lower SVC, the left subclavian central venous catheter tip projecting over the upper SVC, and the nasogastric tube looped in the stomach. External pacing pads.  Markedly suboptimal inspiration with atelectasis in the lower lobes, right greater than left, unchanged. Pulmonary venous hypertension without overt edema, unchanged.  No new pulmonary parenchymal abnormalities.  IMPRESSION:  1.  Support apparatus satisfactory with the exception that the endotracheal tube tip is low, just about 2 cm above the carina. Specifically, no change in the position of the Impella device since earlier same date. 2.  Suboptimal inspiration with atelectasis in the lung bases, right greater than left, unchanged since earlier in the day. 3.  Pulmonary venous hypertension without overt edema, also unchanged. 4.  No new pulmonary parenchymal abnormalities.   Original Report Authenticated By: Hulan Saas, M.D.   Dg Chest Port 1 View  12/31/2012   *RADIOLOGY REPORT*  Clinical Data: Endotracheal  tube placement  PORTABLE CHEST - 1 VIEW  Comparison: 12/30/2012  Findings: Endotracheal tube tip is 1.1 cm from the carina.  NG tube stable.  Swan-Ganz catheter from the abdomen is stable with its tip in the right hilum.  Right internal jugular dialysis catheter is stable.  Left subclavian central venous catheter is stable.  Low volumes, bilateral pleural effusions, and mild edema are not significantly changed.  No pneumothorax.  IMPRESSION: Stable pleural effusions and edema.   Original Report Authenticated By: Jolaine Click, M.D.   Dg Chest Port 1 View  12/30/2012   *RADIOLOGY REPORT*  Clinical Data: Catheter placement  PORTABLE CHEST - 1 VIEW  Comparison: Same date.  Findings: No change is noted in position of endotracheal or nasogastric tubes.  Left subclavian catheter is unchanged in position with tip in SVC.  There has been interval placement of right internal jugular dialysis catheter with distal tip in expected position of the SVC.  No pneumothorax is noted. Hypoinflation of the lungs is noted.  Central pulmonary vascular congestion is noted.  IMPRESSION: Status post placement of right internal jugular dialysis catheter with tip in expected position of the SVC.  No pneumothorax is seen.   Original Report Authenticated By: Lupita Raider.,  M.D.   Dg Chest Port 1 View  12/30/2012   *RADIOLOGY REPORT*  Clinical Data: Endotracheal tube placement.  PORTABLE CHEST - 1 VIEW  Comparison: 12/29/2012.  Findings: Low volume chest.  The endotracheal tube tip is 24 mm from the carina.  Defibrillator pads over the chest.  Enteric tube is present with the tip not visualized.  Inferior presumed femoral approach Swan-Ganz catheter is present with the tip in the descending right pulmonary artery.  Progressive atelectasis/volume loss in the right middle and right lower lobes.  Prominent atelectasis associated with lower lung volumes.  Pulmonary vascular congestion is accentuated by the low volumes. Left subclavian central  line remains present with the tip in the mid to lower SVC.  IMPRESSION:  1.  Stable support apparatus aside from the addition of what appears to be a femoral approach Swan-Ganz catheter with the tip in the descending right pulmonary artery. 2.  Lower lung volumes with increasing atelectasis, most prominent at the right base.   Original Report Authenticated By: Andreas Newport, M.D.   Dg Chest Portable 1 View  12/29/2012   *RADIOLOGY REPORT*  Clinical Data: Unresponsive  PORTABLE CHEST - 1 VIEW  Comparison: Chest radiograph 12/29/2012  Findings: Endotracheal tube is 3.5 cm from carina.  Left central venous line is unchanged.  Stable cardiac silhouette.  There is central venous congestion similar prior.  There is bibasilar atelectasis greater on the right not changed.  IMPRESSION:  1.  Stable support apparatus. 2.  No interval change.  3.  Low lung volumes, basilar atelectasis, and central venous congestion.   Original Report Authenticated By: Genevive Bi, M.D.   Dg Chest Port 1 View  12/29/2012   *RADIOLOGY REPORT*  Clinical Data: Evaluate endotracheal tube and left subclavian line placement  PORTABLE CHEST - 1 VIEW  Comparison: Earlier same day  Findings:  Grossly unchanged enlarged cardiac silhouette and mediastinal contours, possibly attributable to decreased lung volumes and AP projection.  An endotracheal tube overlies the tracheal air column with tip superior to the carina.  Interval placement of a left subclavian vein approach central venous catheter with tip projecting over the superior cavoatrial junction.  Enteric tube tip and side port project below the left hemidiaphragm.  No pneumothorax.  Pulmonary vasculature is indistinct with cephalization of flow.  Small/trace bilateral effusions are suspected with minimal amount of fluid layering within the right minor fissure.  Worsening perihilar opacities.  Unchanged bones.  IMPRESSION: 1.  Appropriately positioned support apparatus as above.  No  pneumothorax. 2.  Suspected pulmonary edema. 3.  Reduced lung volumes with worsening perihilar opacities, likely atelectasis.  findings suggestive of pulmonary edema   Original Report Authenticated By: Tacey Ruiz, MD   Dg Chest Portable 1 View  12/29/2012   *RADIOLOGY REPORT*  Clinical Data: Status post intubation  PORTABLE CHEST - 1 VIEW  Comparison: None.  Findings: An endotracheal tube and nasogastric catheter are noted. Nasogastric catheter appears coiled within the distal esophagus. The endotracheal tube is in satisfactory position.  The lungs are well-aerated with some mild right basilar atelectasis.   IMPRESSION: Tubes and lines as described above.  Mild right basilar atelectasis.   Original Report Authenticated By: Alcide Clever, M.D.   Dg Abd Portable 1v  12/30/2012   *RADIOLOGY REPORT*  Clinical Data: NG tube placement  PORTABLE ABDOMEN - 1 VIEW  Comparison: None.  Findings: The NG tube is curled in the fundus of the stomach.  The tip is in the body.  No disproportionate dilatation of bowel. Multiple linear devices project over the abdomen.  IMPRESSION: NG tube is coiled in the stomach   Original Report Authenticated By: Jolaine Click, M.D.    Microbiology: No results found for this or any previous visit (from the past 240 hour(s)).   Labs: Basic Metabolic Panel:  Recent Labs Lab 01/15/13 0500 01/16/13 0525 01/17/13 0545 01/17/13 0830 01/18/13 0602  NA 136 135 134* 132* 135  K 4.6 5.2* 5.7* 5.2* 5.5*  CL 96 95* 94* 93* 95*  CO2 26 23 22 22 29   GLUCOSE 92 89 88 164* 84  BUN 47* 68* 89* 90* 58*  CREATININE 7.80* 10.77* 13.81* 13.77* 11.18*  CALCIUM 8.7 8.8 8.8 8.7 8.7  PHOS 6.3* 6.4* 7.2* 7.0* 6.2*   Liver Function Tests:  Recent Labs Lab 01/15/13 0500 01/16/13 0525 01/17/13 0545 01/17/13 0830 01/18/13 0602  ALBUMIN 2.7* 2.7* 2.7* 2.7* 2.7*   No results found for this basename: LIPASE, AMYLASE,  in the last 168 hours No results found for this basename: AMMONIA,  in  the last 168 hours CBC:  Recent Labs Lab 01/14/13 0500 01/15/13 0500 01/17/13 0808  WBC 14.1* 13.0* 12.3*  HGB 9.9* 9.8* 9.1*  HCT 31.2* 30.8* 28.1*  MCV 98.1 98.7 95.6  PLT 432* 425* 384   Cardiac Enzymes: No results found for this basename: CKTOTAL, CKMB, CKMBINDEX, TROPONINI,  in the last 168 hours BNP: BNP (last 3 results)  Recent Labs  12/30/12 0500  PROBNP 3296.0*   CBG:  Recent Labs Lab 01/17/13 1209 01/17/13 1718 01/17/13 2025 01/18/13 0731 01/18/13 0818  GLUCAP 80 88 155* 65* 138*       Signed:  WOODS, CURTIS, J  [facility] 01/18/2013, 10:03 AM

## 2013-01-18 NOTE — Progress Notes (Signed)
Per night shift RN, pt daughter states patient does not have CPAP machine at home, is requesting this addressed prior to d/c. Will continue to monitor.

## 2013-01-18 NOTE — Progress Notes (Signed)
Advised by [facility] CM that there are no CIR beds available. I have updated patient's family and provided them with bed offers- will proceed with SNF plan and for insurance auth [facility]).   Reece Levy, MSW [phone]

## 2013-01-19 DIAGNOSIS — Z5189 Encounter for other specified aftercare: Secondary | ICD-10-CM

## 2013-01-19 DIAGNOSIS — G4733 Obstructive sleep apnea (adult) (pediatric): Secondary | ICD-10-CM | POA: Diagnosis present

## 2013-01-19 DIAGNOSIS — B353 Tinea pedis: Secondary | ICD-10-CM

## 2013-01-19 LAB — CBC WITH DIFFERENTIAL/PLATELET
Eosinophils Relative: 13 % — ABNORMAL HIGH (ref 0–5)
HCT: 27.8 % — ABNORMAL LOW (ref 39.0–52.0)
Hemoglobin: 8.8 g/dL — ABNORMAL LOW (ref 13.0–17.0)
Lymphocytes Relative: 13 % (ref 12–46)
Lymphs Abs: 1.4 10*3/uL (ref 0.7–4.0)
MCV: 97.2 fL (ref 78.0–100.0)
Monocytes Absolute: 1.1 10*3/uL — ABNORMAL HIGH (ref 0.1–1.0)
Monocytes Relative: 10 % (ref 3–12)
Neutro Abs: 7.3 10*3/uL (ref 1.7–7.7)
RDW: 14 % (ref 11.5–15.5)
WBC: 11.5 10*3/uL — ABNORMAL HIGH (ref 4.0–10.5)

## 2013-01-19 LAB — MAGNESIUM: Magnesium: 2.7 mg/dL — ABNORMAL HIGH (ref 1.5–2.5)

## 2013-01-19 LAB — COMPREHENSIVE METABOLIC PANEL
AST: 19 U/L (ref 0–37)
BUN: 74 mg/dL — ABNORMAL HIGH (ref 6–23)
CO2: 25 mEq/L (ref 19–32)
Calcium: 8.6 mg/dL (ref 8.4–10.5)
Chloride: 94 mEq/L — ABNORMAL LOW (ref 96–112)
Creatinine, Ser: 13.52 mg/dL — ABNORMAL HIGH (ref 0.50–1.35)
GFR calc Af Amer: 4 mL/min — ABNORMAL LOW (ref >90)
GFR calc non Af Amer: 4 mL/min — ABNORMAL LOW (ref >90)
Glucose, Bld: 84 mg/dL (ref 70–99)
Total Bilirubin: 0.3 mg/dL (ref 0.3–1.2)

## 2013-01-19 LAB — PHOSPHORUS: Phosphorus: 5.3 mg/dL — ABNORMAL HIGH (ref 2.3–4.6)

## 2013-01-19 LAB — GLUCOSE, CAPILLARY
Glucose-Capillary: 76 mg/dL (ref 70–99)
Glucose-Capillary: 157 mg/dL — ABNORMAL HIGH (ref 70–99)

## 2013-01-19 MED ORDER — HEPARIN SODIUM (PORCINE) 1000 UNIT/ML DIALYSIS
1000.0000 [IU] | INTRAMUSCULAR | Status: DC | PRN
  Administered 2013-01-19: 1000 [IU] via INTRAVENOUS_CENTRAL

## 2013-01-19 MED ORDER — SODIUM CHLORIDE 0.9 % IV SOLN: 100.0000 mL | INTRAVENOUS | Status: DC | PRN

## 2013-01-19 MED ORDER — LIDOCAINE HCL (PF) 1 % IJ SOLN: 5.0000 mL | INTRAMUSCULAR | Status: DC | PRN

## 2013-01-19 MED ORDER — LIDOCAINE-PRILOCAINE 2.5-2.5 % EX CREA: 1.0000 "application " | TOPICAL_CREAM | CUTANEOUS | Status: DC | PRN

## 2013-01-19 MED ORDER — DEXTROSE 5 % IV SOLN: 1.5000 g | INTRAVENOUS | Status: DC

## 2013-01-19 MED ORDER — HEPARIN SODIUM (PORCINE) 1000 UNIT/ML DIALYSIS: 100.0000 [IU]/kg | INTRAMUSCULAR | Status: DC | PRN

## 2013-01-19 MED ORDER — PENTAFLUOROPROP-TETRAFLUOROETH EX AERO: 1.0000 "application " | INHALATION_SPRAY | CUTANEOUS | Status: DC | PRN

## 2013-01-19 MED ORDER — ALTEPLASE 2 MG IJ SOLR: 2.0000 mg | Freq: Once | INTRAMUSCULAR | Status: DC | PRN

## 2013-01-19 MED ORDER — NEPRO/CARBSTEADY PO LIQD: 237.0000 mL | ORAL | Status: DC | PRN

## 2013-01-19 MED ORDER — HEPARIN SODIUM (PORCINE) 1000 UNIT/ML DIALYSIS: 1000.0000 [IU] | INTRAMUSCULAR | Status: DC | PRN

## 2013-01-19 NOTE — Progress Notes (Signed)
Patient refused to have her CBG checked at 1200 for lunch. Patient was informed on reasoning for CBG check. Patient stated, "I've been stuck already and too many times." Will continue to educate and monitor patient.

## 2013-01-19 NOTE — Progress Notes (Signed)
Working with [employer] and insurance rep for [employer]- will advise once this rec'd. Family on board and very support and encouraged for his care/rehab.  Reece Levy, MSW [phone]

## 2013-01-19 NOTE — Progress Notes (Signed)
OT Cancellation Note:  Pt receiving HD.  Will continue to follow. 01/19/2013 Martie Round, OTR/L Pager: [phone]

## 2013-01-19 NOTE — Progress Notes (Signed)
I have seen and examined this patient and agree with the plan of care. Comfortable on dilaysis no complaints Nicholas Potts W 01/19/2013, 11:05 AM

## 2013-01-19 NOTE — Discharge Summary (Signed)
Physician Discharge Summary  Nicholas Potts:[id number] DOB: June 07, 1961 DOA: 12/29/2012  PCP: No primary provider on file.  Admit date: 12/29/2012 Discharge date: 01/19/2013  Time spent:40 minutes  Recommendations for Outpatient Follow-up:  1. Cardiogenic shock/acute CHF NYHA class III; resolved, echocardiogram from 01/11/2013 shows grade 1 diastolic dysfunction.    2. HTN; within AHA guidelines no change the meds require  3. Acute renal failure/ATN; now on hemodialysis T/TH/Sat; per   Dr Hyman Hopes (Nephrologist) I would prepare patient for ESRD management. No return of renal function anticipated soon. Will need placement of AVF early next week with discharge plans to [facility] TTS. Counseled patient and son that they were more than likely be here until at least Sunday  4. Accidental opiate poisoning; stable  5. Tinea Pedis; continue Ketoconazole and Pumice stone for Lt Foot, Hydroxyazine for pruritis.   6. OSA; patient doing well on hospital supplied CPAP, will require outpatient sleep study in order to reestablish his baseline.   Discharge Diagnoses:  Principal Problem:   Acute respiratory failure Active Problems:   Cardiogenic shock   Acute renal failure (ARF)   Cardiac and respiratory arrest with VT due to:   Aspiration pneumonia   Accidental opiate poisoning   ARF (acute renal failure) with tubular necrosis requiring HD   Acute systolic congestive heart failure, NYHA class 3   Anemia   Thrombocytopenia, unspecified   HTN (hypertension)   Tinea pedis   Discharge Condition:   Diet recommendation:   Filed Weights   01/17/13 2022 01/18/13 2041 01/19/13 0904  Weight: 111.5 kg (245 lb 13 oz) 111.5 kg (245 lb 13 oz) 111 kg (244 lb 11.4 oz)    History of present illness:  13- yo HM PMHx Rt Achilles tendon reconstruction was brought in for apnea due to Unintentional narcotic overdose. Patient was found to have respiratory acidosis and hypothermia  requiring intubation in the ER. Subsequent course complicated by hypotension, profound bradycardia and became pulseless requiring CPR. Patient then went into V. tach requiring defibrillation and amiodarone. He was stabilized and transferred to ICU. Emergent echo done showed severe wall motion abnormality. He subsequently coded 2 more times and taken to cath lab immediately. Patient found to have severe cardiogenic shock and severely in the dysfunction with EF of 20%. Patient was placed on the impella dynamic support system along with pressors . Patient also developed oliguric renal failure related to shock for which a hemodialysis catheter was placed and CVVHD initiated. Patient had severe encephalopathy secondary to acute insult. Neurology was consulted and had an EEG done which showed moderate to severe generalized nonspecific slowing. Patient's mental status slowly started to improve and after successful extubation he was transferred to step down. Cardiomyopathy has resolved. ARF not resolving and still oliguric and nephrology continues to follow.TODAY patient somewhat lethargic following HD. Son answered all questions   Hospital Course:    Procedures:  Echocardiogram 01/11/2013 - Left ventricle: The cavity size was normal. Wall thickness was normal. Systolic function was normal. LVEF= 55% to 60%.  Doppler parameters are consistent with abnormal left ventricular relaxation (grade 1 diastolic dysfunction). - Aortic valve: There was no stenosis. - Mitral valve: No significant regurgitation. - Left atrium: The atrium was mildly dilated. - Right ventricle: The cavity size was normal. Systolic function was normal.   Consultations:  Nephrology, orthopedics, vascular surgery, cardiology, critical care  Discharge Exam: Filed Vitals:   01/19/13 0600 01/19/13 0842 01/19/13 0904 01/19/13 0907  BP: 120/66 118/68 118/70  114/65  Pulse: 92 89 89 90  Temp: 99.4 F (37.4 C) 98.5 F (36.9 C)     TempSrc: Oral Oral    Resp: 18 22 20 20   Height:      Weight:   111 kg (244 lb 11.4 oz)   SpO2: 96% 94%      General: Alert, NAD Cardiovascular: Regular rhythm and rate, negative murmurs rubs or gallops, DP/TP pulses 2+ bilateral Respiratory: Clear to auscultation bilateral Muscle skeletal; left plantar surface of foot showed significant tinea pedis, right Achilles tendon repair no sign of infection Steri-Strips still in place, right chest wall Vas-Cath no sign of infection  Discharge Instructions     Medication List    ASK your doctor about these medications       aspirin 325 MG tablet  Take 325 mg by mouth daily.     meloxicam 15 MG tablet  Commonly known as:  MOBIC  Take 15 mg by mouth daily.     ONE-A-DAY 50 PLUS Tabs  Take 1 tablet by mouth daily.     oxycodone 5 MG capsule  Commonly known as:  OXY-IR  Take 10 mg by mouth every 4 (four) hours as needed for pain.     simvastatin 20 MG tablet  Commonly known as:  ZOCOR  Take 20 mg by mouth daily.     traMADol 50 MG tablet  Commonly known as:  ULTRAM  Take 50 mg by mouth daily.       No Known Allergies    The results of significant diagnostics from this hospitalization (including imaging, microbiology, ancillary and laboratory) are listed below for reference.    Significant Diagnostic Studies: Ct Head Wo Contrast  12/29/2012   *RADIOLOGY REPORT*  Clinical Data: Altered mental status.  Possible overdose.  CT HEAD WITHOUT CONTRAST  Technique:  Contiguous axial images were obtained from the base of the skull through the vertex without contrast.  Comparison: Head CT 12/10/2007.  Findings: There is no evidence of acute intracranial hemorrhage, mass lesion, brain edema or extra-axial fluid collection.  The ventricles and subarachnoid spaces are appropriately sized for age. There is no CT evidence of acute cortical infarction.  There is diffuse mucosal thickening throughout the nasal passages, ethmoid and maxillary  sinuses.  The nasopharyngeal airway is closed at the time of imaging (the patient is intubated).  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  The mastoids and middle ears are clear. The calvarium is intact.  IMPRESSION:  1.  No acute intracranial or calvarial findings identified. 2.  Diffuse mucosal thickening throughout the visualized nasal passages and paranasal sinuses. Significant maxillary periodontal disease bilaterally.   Original Report Authenticated By: Carey Bullocks, M.D.   Dg Chest Port 1 View  01/11/2013   *RADIOLOGY REPORT*  Clinical Data: Shortness of breath.  Pneumonia.  PORTABLE CHEST - 1 VIEW  Comparison: Plain film chest 01/08/2013 and 01/09/2013.  Findings: Dialysis catheter remains in place.  Lung volumes are low.  Mild left basilar atelectasis is identified.  There is no pulmonary edema.  No pneumothorax or pleural fluid is identified. Heart size is enlarged.  IMPRESSION:  Mild subsegmental atelectasis left lung base.  No acute abnormality.   Original Report Authenticated By: Holley Dexter, M.D.   Dg Chest Port 1 View  01/09/2013   *RADIOLOGY REPORT*  Clinical Data: Central catheter placement  PORTABLE CHEST - 1 VIEW  Comparison: Study obtained earlier in the day  Findings: Dual lumen catheter tip is at the  cavoatrial junction. Left-sided catheter tip is in the right atrium.  No pneumothorax.  There is mild atelectasis in the left base.  Elsewhere, lungs are clear.  Heart is upper normal in size with normal pulmonary vascularity.  No adenopathy.  IMPRESSION: Catheters as described without pneumothorax.  Mild left base atelectasis.   Original Report Authenticated By: Bretta Bang, M.D.   Dg Chest Port 1 View  01/09/2013   *RADIOLOGY REPORT*  Clinical Data: Follow up pneumonia  PORTABLE CHEST - 1 VIEW  Comparison: Prior radiograph from 01/08/2013  Findings: The the patient has been extubated in the interim.  Right IJ Cordis catheter is stable in position.  Enteric  tube is been removed.  Cardiac silhouette is unchanged.  The lungs remain hypoinflated.  There is been interval improvement in aeration of both lung bases with improved bibasilar opacities. Veiling opacity overlying the costophrenic angles are consistent with small pleural effusions, slightly larger on the left.  Osseous structures are unchanged.  IMPRESSION: 1.  Interval improvement in aeration of both lung bases with improved bibasilar opacities status post extubation. Particularly, dense opacity at the left lung base is improved, which may represent improved atelectasis, pleural fluid,  and / or infiltrate. 2.  Small bilateral pleural effusions, left greater than right.   Original Report Authenticated By: Rise Mu, M.D.   Dg Chest Port 1 View  01/08/2013   *RADIOLOGY REPORT*  Clinical Data: Follow up pneumonia  PORTABLE CHEST - 1 VIEW  Comparison: Prior chest x-ray 01/07/2013  Findings: The endotracheal tube is 3.5 cm above the carina.  Left subclavian approach central venous catheter with the tip at the superior cavoatrial junction.  The right IJ approach temporary hemodialysis catheter is noted with the tip in the upper SVC. Visualized nasogastric tube in unchanged position.  Cardiac and mediastinal contours are unchanged.  Inspiratory volumes remain very low and there is left basilar atelectasis versus infiltrate. Small right pleural effusion is improving and there is improved aeration in the right base.  IMPRESSION:  1.  Stable and satisfactory support apparatus. 2.  Similar appearance of dense left basilar opacity which may represent a combination of pleural fluid with atelectasis and / or infiltrate. 3.  Improved aeration in the right base with decreasing pleural fluid and opacity.   Original Report Authenticated By: Malachy Moan, M.D.   Dg Chest Port 1 View  01/07/2013   *RADIOLOGY REPORT*  Clinical Data: Assess infiltrate  PORTABLE CHEST - 1 VIEW  Comparison: Prior radiograph from  01/06/2013  Findings: The the patient remains intubated with the tip of the endotracheal tube located 2.9 cm above the carina.  Enteric tube courses into the abdomen, and appears coiled within the gastric fundus, similar to prior. Right IJ central venous catheter is unchanged.  Left subclavian central venous catheter is in stable position with tip at cavoatrial junction.  Heart size is unchanged.  The lungs remain hypoinflated.  Bilateral layering pleural effusions persist, likely capping the right lung apex.  Associated bibasilar airspace opacities are also not significantly changed, which may read represent atelectasis or infiltrate.  No overt pulmonary edema.  No pneumothorax.  The bony structures are intact.  IMPRESSION: 1.  Support apparatus as above.  Tip of endotracheal tube 2.9 cm above the carina. 2.  Similar appearance of the chest with persistent hypoinflation with bilateral pleural effusions and associated bibasilar airspace opacities, which may reflect atelectasis or infiltrate.   Original Report Authenticated By: Rise Mu, M.D.   Dg Chest [facility]  1 View  01/06/2013   *RADIOLOGY REPORT*  Clinical Data: Pleural effusions.  PORTABLE CHEST - 1 VIEW  Comparison: 01/05/2013.  Findings: Endotracheal tube ends 2 cm above the carina.  Gastric suction tube is coiled in the stomach.  The right IJ sheath has been retracted slightly, still in good position at the level of the upper SVC. Left subclavian central venous catheter, tip at the level of the superior cavoatrial junction.  Unchanged heart size and mediastinal contours, distorted by low lung volumes.  Layering bilateral pleural effusions, likely capping the apical lungs.  There is likely also a lung opacity at the left base. No evident pneumothorax.  IMPRESSION:  1.  Satisfactory positioning of tubes and lines.  Please note that the right IJ catheter has been retracted since yesterday. 2. Low volume lungs with bilateral pleural effusions and left  base atelectasis or infiltrate.   Original Report Authenticated By: Tiburcio Pea   Dg Chest Port 1 View  01/05/2013   *RADIOLOGY REPORT*  Clinical Data: Atelectasis.  PORTABLE CHEST - 1 VIEW  Comparison: 01/04/2013.  Findings: The support apparatus is unchanged.  Endotracheal tube tip is 29 mm from the carina.  Pulmonary aeration is improved compared yesterday's exam with decreasing basilar atelectasis. Positional shift in the pleural effusions.  Pulmonary vascular congestion persists.  Cardiopericardial silhouette grossly appears unchanged.  IMPRESSION:  1.  Stable support apparatus. 2.  Improving pulmonary aeration with positional shift in the pleural effusions.  Overall low volume chest.   Original Report Authenticated By: Andreas Newport, M.D.   Dg Chest Port 1 View  01/04/2013   *RADIOLOGY REPORT*  Clinical Data: Evaluate endotracheal tube position.  Infiltrate and atelectasis.  PORTABLE CHEST - 1 VIEW  Comparison: 01/03/2013.  Findings: Endotracheal tube tip is 28 mm from the carina.  Right IJ vas cath.  Enteric tube is present with the tip not visualized. There is probably a loop of the enteric tube in the proximal fundus of the stomach.  Markedly low lung volumes remain present with basilar atelectasis and diffuse bilateral basilar predominant airspace disease most compatible with pulmonary edema.  Bilateral pleural effusions are also present.  Left subclavian central line appears similar with the tip in the mid-to-lower SVC.  IMPRESSION:  1.  Endotracheal tube tip 28 mm from the carina. 2.  Other support apparatus unchanged. 3.  Unchanged pulmonary aeration with bilateral airspace disease/edema, pleural effusions and atelectasis.   Original Report Authenticated By: Andreas Newport, M.D.   Dg Chest Port 1 View  01/03/2013   *RADIOLOGY REPORT*  Clinical Data: Endotracheal tube placement  PORTABLE CHEST - 1 VIEW  Comparison: Prior chest x-ray 01/02/2013  Findings: The tip of the endotracheal tube is  1.5 cm above the carina.  The patient is in a kyphotic position.  The right IJ temporary hemodialysis catheter tip projects over the distal SVC. The tip of the left subclavian approach central venous catheter projects over the superior cavoatrial junction. The nasogastric tube can be seen coiled within the gastric fundus.  Inspiratory volumes are very low.  There is mild pulmonary edema and diffuse bilateral interstitial and airspace opacities.  Probable bilateral layering effusions.  Stable cardiomegaly.  IMPRESSION:  1.  The tip of the endotracheal tube is 1.5 cm above the carina. Relatively distal placement of the endotracheal tube likely exaggerated by the patient is kyphotic positioning. 2.  Other support apparatus in stable and satisfactory position. 3.  Similar appearance of the lungs with very low inspiratory volumes, edema,  bilateral layering effusions and infiltrate versus atelectasis.   Original Report Authenticated By: Malachy Moan, M.D.   Dg Chest Port 1 View  01/02/2013   *RADIOLOGY REPORT*  Clinical Data: Endotracheal tube position  PORTABLE CHEST - 1 VIEW  Comparison: 01/01/2013  Findings: Endotracheal tube is appropriately positioned.  Insert NG right IJ central line tip terminates over the distal SVC.  Mild cardiomegaly process of central vascular congestion and small pleural effusions obscuring lung bases.  IMPRESSION: Support apparatus as above.  Central vascular congestion with small pleural effusions.   Original Report Authenticated By: Christiana Pellant, M.D.   Dg Chest Port 1 View  01/01/2013   *RADIOLOGY REPORT*  Clinical Data: Hypoxia  PORTABLE CHEST - 1 VIEW  Comparison: December 31, 2012  Findings:  Endotracheal tube tip is 2.5 cm above the carina. Central catheters are unchanged in position. Nasogastric tube tip and side port are in the stomach region.  No apparent pneumothorax.  There is consolidation in the left lower lobe with small left effusion.  The right lung is clear.  Heart  is borderline prominent with normal pulmonary vascularity.  No appreciable adenopathy.  IMPRESSION:  Tube and catheter positions as described.  No pneumothorax. There is consolidation in the left lower lobe with small left effusion.  Right lung clear.   Original Report Authenticated By: Bretta Bang, M.D.   Dg Chest Port 1 View  12/31/2012   *RADIOLOGY REPORT*  Clinical Data: Critically ill patient with possible change in position of one of the indwelling support apparatus, the Impella device.  PORTABLE CHEST - 1 VIEW 12/31/2012 1141 hours:  Comparison: Portable chest x-ray earlier same date 0608 hours.  Findings: The Impella device which has been placed from a femoral approach is unchanged in position since the examination earlier today, with the distal hook in the left ventricle and the device traversing the aortic valve.  Endotracheal tube tip somewhat low, chest approximately 2 cm above the carina, as noted previously. Remaining support apparatus satisfactory with the Swan-Ganz catheter tip overlying the proximal right main pulmonary artery, the right jugular central venous catheter tip overlying the lower SVC, the left subclavian central venous catheter tip projecting over the upper SVC, and the nasogastric tube looped in the stomach. External pacing pads.  Markedly suboptimal inspiration with atelectasis in the lower lobes, right greater than left, unchanged. Pulmonary venous hypertension without overt edema, unchanged.  No new pulmonary parenchymal abnormalities.  IMPRESSION:  1.  Support apparatus satisfactory with the exception that the endotracheal tube tip is low, just about 2 cm above the carina. Specifically, no change in the position of the Impella device since earlier same date. 2.  Suboptimal inspiration with atelectasis in the lung bases, right greater than left, unchanged since earlier in the day. 3.  Pulmonary venous hypertension without overt edema, also unchanged. 4.  No new pulmonary  parenchymal abnormalities.   Original Report Authenticated By: Hulan Saas, M.D.   Dg Chest Port 1 View  12/31/2012   *RADIOLOGY REPORT*  Clinical Data: Endotracheal tube placement  PORTABLE CHEST - 1 VIEW  Comparison: 12/30/2012  Findings: Endotracheal tube tip is 1.1 cm from the carina.  NG tube stable.  Swan-Ganz catheter from the abdomen is stable with its tip in the right hilum.  Right internal jugular dialysis catheter is stable.  Left subclavian central venous catheter is stable.  Low volumes, bilateral pleural effusions, and mild edema are not significantly changed.  No pneumothorax.  IMPRESSION: Stable pleural effusions and edema.  Original Report Authenticated By: Jolaine Click, M.D.   Dg Chest Port 1 View  12/30/2012   *RADIOLOGY REPORT*  Clinical Data: Catheter placement  PORTABLE CHEST - 1 VIEW  Comparison: Same date.  Findings: No change is noted in position of endotracheal or nasogastric tubes.  Left subclavian catheter is unchanged in position with tip in SVC.  There has been interval placement of right internal jugular dialysis catheter with distal tip in expected position of the SVC.  No pneumothorax is noted. Hypoinflation of the lungs is noted.  Central pulmonary vascular congestion is noted.  IMPRESSION: Status post placement of right internal jugular dialysis catheter with tip in expected position of the SVC.  No pneumothorax is seen.   Original Report Authenticated By: Lupita Raider.,  M.D.   Dg Chest Port 1 View  12/30/2012   *RADIOLOGY REPORT*  Clinical Data: Endotracheal tube placement.  PORTABLE CHEST - 1 VIEW  Comparison: 12/29/2012.  Findings: Low volume chest.  The endotracheal tube tip is 24 mm from the carina.  Defibrillator pads over the chest.  Enteric tube is present with the tip not visualized.  Inferior presumed femoral approach Swan-Ganz catheter is present with the tip in the descending right pulmonary artery.  Progressive atelectasis/volume loss in the right  middle and right lower lobes.  Prominent atelectasis associated with lower lung volumes.  Pulmonary vascular congestion is accentuated by the low volumes. Left subclavian central line remains present with the tip in the mid to lower SVC.  IMPRESSION:  1.  Stable support apparatus aside from the addition of what appears to be a femoral approach Swan-Ganz catheter with the tip in the descending right pulmonary artery. 2.  Lower lung volumes with increasing atelectasis, most prominent at the right base.   Original Report Authenticated By: Andreas Newport, M.D.   Dg Chest Portable 1 View  12/29/2012   *RADIOLOGY REPORT*  Clinical Data: Unresponsive  PORTABLE CHEST - 1 VIEW  Comparison: Chest radiograph 12/29/2012  Findings: Endotracheal tube is 3.5 cm from carina.  Left central venous line is unchanged.  Stable cardiac silhouette.  There is central venous congestion similar prior.  There is bibasilar atelectasis greater on the right not changed.  IMPRESSION:  1.  Stable support apparatus. 2.  No interval change.  3.  Low lung volumes, basilar atelectasis, and central venous congestion.   Original Report Authenticated By: Genevive Bi, M.D.   Dg Chest Port 1 View  12/29/2012   *RADIOLOGY REPORT*  Clinical Data: Evaluate endotracheal tube and left subclavian line placement  PORTABLE CHEST - 1 VIEW  Comparison: Earlier same day  Findings:  Grossly unchanged enlarged cardiac silhouette and mediastinal contours, possibly attributable to decreased lung volumes and AP projection.  An endotracheal tube overlies the tracheal air column with tip superior to the carina.  Interval placement of a left subclavian vein approach central venous catheter with tip projecting over the superior cavoatrial junction.  Enteric tube tip and side port project below the left hemidiaphragm.  No pneumothorax.  Pulmonary vasculature is indistinct with cephalization of flow.  Small/trace bilateral effusions are suspected with minimal amount  of fluid layering within the right minor fissure.  Worsening perihilar opacities.  Unchanged bones.  IMPRESSION: 1.  Appropriately positioned support apparatus as above.  No pneumothorax. 2.  Suspected pulmonary edema. 3.  Reduced lung volumes with worsening perihilar opacities, likely atelectasis.  findings suggestive of pulmonary edema   Original Report Authenticated By: Tacey Ruiz, MD   Dg  Chest Portable 1 View  12/29/2012   *RADIOLOGY REPORT*  Clinical Data: Status post intubation  PORTABLE CHEST - 1 VIEW  Comparison: None.  Findings: An endotracheal tube and nasogastric catheter are noted. Nasogastric catheter appears coiled within the distal esophagus. The endotracheal tube is in satisfactory position.  The lungs are well-aerated with some mild right basilar atelectasis.   IMPRESSION: Tubes and lines as described above.  Mild right basilar atelectasis.   Original Report Authenticated By: Alcide Clever, M.D.   Dg Abd Portable 1v  12/30/2012   *RADIOLOGY REPORT*  Clinical Data: NG tube placement  PORTABLE ABDOMEN - 1 VIEW  Comparison: None.  Findings: The NG tube is curled in the fundus of the stomach.  The tip is in the body.  No disproportionate dilatation of bowel. Multiple linear devices project over the abdomen.  IMPRESSION: NG tube is coiled in the stomach   Original Report Authenticated By: Jolaine Click, M.D.    Microbiology: No results found for this or any previous visit (from the past 240 hour(s)).   Labs: Basic Metabolic Panel:  Recent Labs Lab 01/16/13 0525 01/17/13 0545 01/17/13 0830 01/18/13 0602 01/19/13 0435  NA 135 134* 132* 135 134*  K 5.2* 5.7* 5.2* 5.5* 5.4*  CL 95* 94* 93* 95* 94*  CO2 23 22 22 29 25   GLUCOSE 89 88 164* 84 84  BUN 68* 89* 90* 58* 74*  CREATININE 10.77* 13.81* 13.77* 11.18* 13.52*  CALCIUM 8.8 8.8 8.7 8.7 8.6  MG  --   --   --   --  2.7*  PHOS 6.4* 7.2* 7.0* 6.2* 5.3*   Liver Function Tests:  Recent Labs Lab 01/16/13 0525 01/17/13 0545  01/17/13 0830 01/18/13 0602 01/19/13 0435  AST  --   --   --   --  19  ALT  --   --   --   --  23  ALKPHOS  --   --   --   --  95  BILITOT  --   --   --   --  0.3  PROT  --   --   --   --  6.9  ALBUMIN 2.7* 2.7* 2.7* 2.7* 2.7*   No results found for this basename: LIPASE, AMYLASE,  in the last 168 hours No results found for this basename: AMMONIA,  in the last 168 hours CBC:  Recent Labs Lab 01/14/13 0500 01/15/13 0500 01/17/13 0808 01/19/13 0435  WBC 14.1* 13.0* 12.3* 11.5*  NEUTROABS  --   --   --  7.3  HGB 9.9* 9.8* 9.1* 8.8*  HCT 31.2* 30.8* 28.1* 27.8*  MCV 98.1 98.7 95.6 97.2  PLT 432* 425* 384 340   Cardiac Enzymes: No results found for this basename: CKTOTAL, CKMB, CKMBINDEX, TROPONINI,  in the last 168 hours BNP: BNP (last 3 results)  Recent Labs  12/30/12 0500  PROBNP 3296.0*   CBG:  Recent Labs Lab 01/17/13 2025 01/18/13 0731 01/18/13 0818 01/18/13 1626 01/18/13 2040  GLUCAP 155* 65* 138* 95 163*       Signed:  Jonie Burdell, J  [facility] 01/19/2013, 9:15 AM

## 2013-01-19 NOTE — Progress Notes (Signed)
No bed available CIR.  Pt's SW, Marylu Lund, made aware.  Pt's insurance contact, Harriett Sine, also made aware & was given Janet's ph#.  Nancy's ph# is [phone] opt 1 ask for Harriett Sine.  Pt's daughter, [name], also made aware no CIR bed available.  She is concerned about her Dad may be depressed & about his being able to continue his CPAP at SNF/home.  ?for insurance [Nancy] about coverage for CPAP?   CIR to sign off.  [phone]

## 2013-01-20 DIAGNOSIS — T6591XS Toxic effect of unspecified substance, accidental (unintentional), sequela: Secondary | ICD-10-CM

## 2013-01-20 DIAGNOSIS — T50901S Poisoning by unspecified drugs, medicaments and biological substances, accidental (unintentional), sequela: Secondary | ICD-10-CM

## 2013-01-20 LAB — CBC WITH DIFFERENTIAL/PLATELET
Basophils Absolute: 0.2 10*3/uL — ABNORMAL HIGH (ref 0.0–0.1)
Eosinophils Absolute: 1.4 10*3/uL — ABNORMAL HIGH (ref 0.0–0.7)
Lymphocytes Relative: 15 % (ref 12–46)
Lymphs Abs: 1.6 10*3/uL (ref 0.7–4.0)
Neutrophils Relative %: 58 % (ref 43–77)
Platelets: 327 10*3/uL (ref 150–400)
RBC: 2.88 MIL/uL — ABNORMAL LOW (ref 4.22–5.81)
RDW: 13.9 % (ref 11.5–15.5)
WBC: 10.5 10*3/uL (ref 4.0–10.5)

## 2013-01-20 LAB — COMPREHENSIVE METABOLIC PANEL
ALT: 20 U/L (ref 0–53)
AST: 19 U/L (ref 0–37)
Alkaline Phosphatase: 82 U/L (ref 39–117)
CO2: 28 mEq/L (ref 19–32)
Calcium: 8.7 mg/dL (ref 8.4–10.5)
GFR calc Af Amer: 6 mL/min — ABNORMAL LOW (ref >90)
Glucose, Bld: 82 mg/dL (ref 70–99)
Potassium: 4.9 mEq/L (ref 3.5–5.1)
Sodium: 139 mEq/L (ref 135–145)
Total Protein: 6.2 g/dL (ref 6.0–8.3)

## 2013-01-20 LAB — GLUCOSE, CAPILLARY
Glucose-Capillary: 82 mg/dL (ref 70–99)
Glucose-Capillary: 161 mg/dL — ABNORMAL HIGH (ref 70–99)

## 2013-01-20 LAB — PHOSPHORUS: Phosphorus: 4.6 mg/dL (ref 2.3–4.6)

## 2013-01-20 MED ORDER — FENTANYL CITRATE 0.05 MG/ML IJ SOLN: 25.0000 ug | INTRAMUSCULAR | Status: DC | PRN

## 2013-01-20 MED ORDER — ONDANSETRON HCL 4 MG/2ML IJ SOLN: 4.0000 mg | Freq: Four times a day (QID) | INTRAMUSCULAR | Status: DC | PRN

## 2013-01-20 MED ORDER — PANTOPRAZOLE SODIUM 40 MG PO TBEC
DELAYED_RELEASE_TABLET | ORAL | Status: AC
  Filled 2013-01-20: qty 1

## 2013-01-20 MED ORDER — TRAMADOL HCL 50 MG PO TABS
50.0000 mg | ORAL_TABLET | Freq: Every day | ORAL | Status: DC | PRN
  Administered 2013-01-24: 50 mg via ORAL
  Filled 2013-01-20: qty 1

## 2013-01-20 MED ORDER — OXYCODONE HCL 5 MG PO TABS: 10.0000 mg | ORAL_TABLET | ORAL | Status: DC | PRN

## 2013-01-20 NOTE — Progress Notes (Signed)
Physical Therapy Treatment Patient Details Name: Nicholas Potts MRN: [id number] DOB: [birth year] Today's Date: 01/20/2013 Time: 4098-1191 PT Time Calculation (min): 10 min  PT Assessment / Plan / Recommendation  History of Present Illness 52 year old male for evaluation of shock. The patient was intubated and had been coded 3 separate times. The patient had repair of his Achilles tendon 2 days prior to admission. He apparently had some difficulties with respiration postoperatively but otherwise did well. He has been taking pain medication as instructed based on his family's report. He was last seen lucid at 9 AM and at approximately 11:30 AM his family found him unresponsive and "white foam coming from his mouth". EMS was called and he was brought to the emergency room where he suffered cardiac arrest 3 separate times. There is note of PEA arrest.   PT Comments   CIR has signed off so plans for pt now to d/c to SNF.  Pt ambulated in hallway however limited by dizziness.  Therapist encouraged progressing distance since chair following and pt could take break however pt ambulated back to bed.   Follow Up Recommendations  SNF     Does the patient have the potential to tolerate intense rehabilitation     Barriers to Discharge        Equipment Recommendations  Rolling walker with 5" wheels    Recommendations for Other Services    Frequency Min 3X/week   Progress towards PT Goals Progress towards PT goals: Progressing toward goals  Plan Frequency needs to be updated;Discharge plan needs to be updated    Precautions / Restrictions Precautions Precautions: Fall Required Braces or Orthoses: Other Brace/Splint Other Brace/Splint: CAM boot with heel lift Restrictions Weight Bearing Restrictions: Yes RLE Weight Bearing: Weight bearing as tolerated Other Position/Activity Restrictions: CAM boot with heel lift for WBAT   Pertinent Vitals/Pain C/o minimal achilles pain with ambulation Pt  also reports slight dizziness with ambulation and especially with bending over to get to feet so therapist had to don/doff CAM boot     Mobility  Bed Mobility Bed Mobility: Supine to Sit;Sit to Supine Supine to Sit: 5: Supervision;With rails;HOB elevated Sit to Supine: 5: Supervision;HOB elevated Details for Bed Mobility Assistance: Needs increased time.  Required assist to don/doff CAM boot. Transfers Transfers: Sit to Stand;Stand to Sit Sit to Stand: With upper extremity assist;From bed;4: Min guard Stand to Sit: With upper extremity assist;To bed;4: Min guard Details for Transfer Assistance: increased time to rise today Ambulation/Gait Ambulation/Gait Assistance: 4: Min guard Ambulation Distance (Feet): 90 Feet Assistive device: Rolling walker Ambulation/Gait Assistance Details: pt reports slight dizziness during ambulation and minimal pain in achilles area with ambulation, chair followed however pt did not need rest break Gait Pattern: Step-to pattern;Trunk flexed;Decreased stance time - right Stairs: No Wheelchair Mobility Wheelchair Mobility: No    Exercises     PT Diagnosis:    PT Problem List:   PT Treatment Interventions:     PT Goals (current goals can now be found in the care plan section)    Visit Information  Last PT Received On: 01/20/13 Assistance Needed: +1 History of Present Illness: 52 year old male for evaluation of shock. The patient was intubated and had been coded 3 separate times. The patient had repair of his Achilles tendon 2 days prior to admission. He apparently had some difficulties with respiration postoperatively but otherwise did well. He has been taking pain medication as instructed based on his family's report. He was last seen lucid at  9 AM and at approximately 11:30 AM his family found him unresponsive and "white foam coming from his mouth". EMS was called and he was brought to the emergency room where he suffered cardiac arrest 3 separate times.  There is note of PEA arrest.    Subjective Data      Cognition  Cognition Arousal/Alertness: Awake/alert Behavior During Therapy: WFL for tasks assessed/performed Overall Cognitive Status: Within Functional Limits for tasks assessed    Balance     End of Session PT - End of Session Equipment Utilized During Treatment: Gait belt (CAM boot) Activity Tolerance: Patient tolerated treatment well Patient left: with call bell/phone within reach;with family/visitor present;in bed   GP     Kimiye Strathman,KATHrine E 01/20/2013, 10:06 AM Zenovia Jarred, PT, DPT 01/20/2013 Pager: [phone]

## 2013-01-20 NOTE — Evaluation (Signed)
Occupational Therapy Evaluation Patient Details Name: Nicholas Potts MRN: [id number] DOB: 12-Aug-1960 Today's Date: 01/20/2013 Time: 4098-1191 OT Time Calculation (min): 16 min  OT Assessment / Plan / Recommendation History of present illness 52 year old male for evaluation of shock. The patient was intubated and had been coded 3 separate times. The patient had repair of his Achilles tendon 2 days prior to admission. He apparently had some difficulties with respiration postoperatively but otherwise did well. He has been taking pain medication as instructed based on his family's report. He was last seen lucid at 9 AM and at approximately 11:30 AM his family found him unresponsive and "white foam coming from his mouth". EMS was called and he was brought to the emergency room where he suffered cardiac arrest 3 separate times. There is note of PEA arrest.   Clinical Impression   Pt admitted with above. Pt currently with functional limitations due to the deficits listed below (see OT Problem List).  Pt will benefit from skilled OT to increase their safety and independence with ADL and functional mobility for ADL to facilitate discharge to venue listed below.       OT Assessment  Patient needs continued OT Services    Follow Up Recommendations  SNF       Equipment Recommendations  None recommended by OT       Frequency  Min 2X/week    Precautions / Restrictions Precautions Precautions: Fall Required Braces or Orthoses: Other Brace/Splint Other Brace/Splint: Right CAM boot with heel lift--to be when up on his feet Restrictions Weight Bearing Restrictions: Yes RLE Weight Bearing: Weight bearing as tolerated Other Position/Activity Restrictions: CAM boot with heel lift for WBAT       ADL  Eating/Feeding: Independent Where Assessed - Eating/Feeding: Edge of bed Grooming: Supervision/safety;Teeth care;Wash/dry hands Where Assessed - Grooming: Unsupported standing Upper Body  Bathing: Set up;Supervision/safety Where Assessed - Upper Body Bathing: Unsupported sitting Lower Body Bathing: Moderate assistance Where Assessed - Lower Body Bathing: Unsupported sit to stand Upper Body Dressing: Set up;Supervision/safety Where Assessed - Upper Body Dressing: Unsupported sitting Lower Body Dressing: Moderate assistance Where Assessed - Lower Body Dressing: Unsupported sit to stand Toilet Transfer: Min Pension scheme manager Method: Sit to Barista: Regular height toilet;Grab bars Toileting - Clothing Manipulation and Hygiene: Moderate assistance Where Assessed - Toileting Clothing Manipulation and Hygiene: Sit to stand from 3-in-1 or toilet Equipment Used: Rolling walker (CAM boot) Transfers/Ambulation Related to ADLs: Min guard A with RW and CAM boot ADL Comments: No C/O dizziness this session    OT Diagnosis: Generalized weakness  OT Problem List: Decreased strength;Decreased safety awareness;Impaired balance (sitting and/or standing);Obesity;Decreased knowledge of use of DME or AE OT Treatment Interventions: Self-care/ADL training;Balance training;Therapeutic activities;Patient/family education;DME and/or AE instruction   OT Goals(Current goals can be found in the care plan section) Acute Rehab OT Goals OT Goal Formulation: With patient Time For Goal Achievement: 01/27/13 Potential to Achieve Goals: Good  Visit Information  Last OT Received On: 01/20/13 Assistance Needed: +1 History of Present Illness: 52 year old male for evaluation of shock. The patient was intubated and had been coded 3 separate times. The patient had repair of his Achilles tendon 2 days prior to admission. He apparently had some difficulties with respiration postoperatively but otherwise did well. He has been taking pain medication as instructed based on his family's report. He was last seen lucid at 9 AM and at approximately 11:30 AM his family found him unresponsive and  "white foam coming  from his mouth". EMS was called and he was brought to the emergency room where he suffered cardiac arrest 3 separate times. There is note of PEA arrest.       Prior Functioning     Home Living Family/patient expects to be discharged to:: Skilled nursing facility Living Arrangements: Children;Spouse/significant other Available Help at Discharge: Family;Available 24 hours/day Type of Home: House Home Access: Stairs to enter [employer] of Steps: 3 Entrance Stairs-Rails: Right Home Layout: One level Home Equipment: Crutches Prior Function Level of Independence: Needs assistance ADL's / Homemaking Assistance Needed: family assisted due to RLE issues Communication Communication: Prefers language other than English (Spainish) Dominant Hand: Right         Vision/Perception Vision - History Patient Visual Report: No change from baseline   Cognition  Cognition Arousal/Alertness: Awake/alert Behavior During Therapy: WFL for tasks assessed/performed Overall Cognitive Status: Within Functional Limits for tasks assessed    Extremity/Trunk Assessment Upper Extremity Assessment Upper Extremity Assessment: Overall WFL for tasks assessed     Mobility Bed Mobility Bed Mobility: Supine to Sit;Sitting - Scoot to Edge of Bed Supine to Sit: 6: Modified independent (Device/Increase time);HOB elevated Sitting - Scoot to Edge of Bed: 7: Independent Transfers Transfers: Sit to Stand;Stand to Sit Sit to Stand: 4: Min guard;With upper extremity assist;From bed Stand to Sit: 4: Min guard;With upper extremity assist;To toilet (grab bar)           End of Session OT - End of Session Equipment Utilized During Treatment: Rolling walker Activity Tolerance: Patient tolerated treatment well Patient left: in bed;with call bell/phone within reach;with family/visitor present (oldest daughter)       Evette Georges  [phone]  01/20/2013, 2:16 PM

## 2013-01-20 NOTE — Progress Notes (Signed)
[facility] has rec'd SNF auth from patient's insurance and can accept him once medically stable- noted plans for VF on MondayReece Levy, MSW [phone]

## 2013-01-20 NOTE — Progress Notes (Signed)
[facility] ROUNDING NOTE   Subjective:   Interval History: reports improving urine output   Objective:  Vital signs in last 24 hours:  Temp:  [98 F (36.7 C)-99.8 F (37.7 C)] 98.4 F (36.9 C) (08/15 0715) Pulse Rate:  [77-94] 82 (08/15 0715) Resp:  [16-20] 18 (08/15 0715) BP: (113-164)/(65-84) 164/77 mmHg (08/15 0715) SpO2:  [94 %-97 %] 96 % (08/15 0715)  Weight change: -0.5 kg (-1 lb 1.6 oz) Filed Weights   01/18/13 2041 01/19/13 0904 01/19/13 1240  Weight: 111.5 kg (245 lb 13 oz) 111 kg (244 lb 11.4 oz) 109.8 kg (242 lb 1 oz)    Intake/Output: I/O last 3 completed shifts: In: 240 [P.O.:240] Out: 1281 [Urine:300; Other:981]   Intake/Output this shift:  Total I/O In: 240 [P.O.:240] Out: -   CVS- RRR RS- CTA ABD- BS present soft non-distended EXT- no edema   Basic Metabolic Panel:  Recent Labs Lab 01/17/13 0545 01/17/13 0830 01/18/13 0602 01/19/13 0435 01/20/13 0640  NA 134* 132* 135 134* 139  K 5.7* 5.2* 5.5* 5.4* 4.9  CL 94* 93* 95* 94* 100  CO2 22 22 29 25 28   GLUCOSE 88 164* 84 84 82  BUN 89* 90* 58* 74* 40*  CREATININE 13.81* 13.77* 11.18* 13.52* 9.45*  CALCIUM 8.8 8.7 8.7 8.6 8.7  MG  --   --   --  2.7* 2.4  PHOS 7.2* 7.0* 6.2* 5.3* 4.6    Liver Function Tests:  Recent Labs Lab 01/17/13 0545 01/17/13 0830 01/18/13 0602 01/19/13 0435 01/20/13 0640  AST  --   --   --  19 19  ALT  --   --   --  23 20  ALKPHOS  --   --   --  95 82  BILITOT  --   --   --  0.3 0.3  PROT  --   --   --  6.9 6.2  ALBUMIN 2.7* 2.7* 2.7* 2.7* 2.5*   No results found for this basename: LIPASE, AMYLASE,  in the last 168 hours No results found for this basename: AMMONIA,  in the last 168 hours  CBC:  Recent Labs Lab 01/14/13 0500 01/15/13 0500 01/17/13 0808 01/19/13 0435 01/20/13 0640  WBC 14.1* 13.0* 12.3* 11.5* 10.5  NEUTROABS  --   --   --  7.3 6.1  HGB 9.9* 9.8* 9.1* 8.8* 8.7*  HCT 31.2* 30.8* 28.1* 27.8* 28.5*  MCV 98.1 98.7  95.6 97.2 99.0  PLT 432* 425* 384 340 327    Cardiac Enzymes: No results found for this basename: CKTOTAL, CKMB, CKMBINDEX, TROPONINI,  in the last 168 hours  BNP: No components found with this basename: POCBNP,   CBG:  Recent Labs Lab 01/19/13 0734 01/19/13 1321 01/19/13 1630 01/19/13 2058 01/20/13 0726  GLUCAP 112* 76 157* 92 82    Microbiology: Results for orders placed during the hospital encounter of 12/29/12  URINE CULTURE     Status: None   Collection Time    12/29/12  3:08 PM      Result Value Range Status   Specimen Description URINE, CATHETERIZED   Final   Special Requests NONE   Final   Culture  Setup Time 12/29/2012 23:00   Final   Colony Count NO GROWTH   Final   Culture NO GROWTH   Final   Report Status 12/30/2012 FINAL   Final  CULTURE, BLOOD (ROUTINE X 2)     Status: None   Collection Time  12/29/12  3:27 PM      Result Value Range Status   Specimen Description BLOOD HAND LEFT   Final   Special Requests BOTTLES DRAWN AEROBIC ONLY 5CC   Final   Culture  Setup Time 12/29/2012 22:48   Final   Culture NO GROWTH 5 DAYS   Final   Report Status 01/04/2013 FINAL   Final  CULTURE, BLOOD (ROUTINE X 2)     Status: None   Collection Time    12/29/12  3:37 PM      Result Value Range Status   Specimen Description BLOOD ARM LEFT   Final   Special Requests BOTTLES DRAWN AEROBIC ONLY 2CC   Final   Culture  Setup Time 12/29/2012 22:48   Final   Culture NO GROWTH 5 DAYS   Final   Report Status 01/04/2013 FINAL   Final  URINE CULTURE     Status: None   Collection Time    12/29/12  7:11 PM      Result Value Range Status   Specimen Description URINE, CATHETERIZED   Final   Special Requests NONE   Final   Culture  Setup Time 12/29/2012 21:09   Final   Colony Count NO GROWTH   Final   Culture NO GROWTH   Final   Report Status 12/31/2012 FINAL   Final  MRSA PCR SCREENING     Status: None   Collection Time    12/30/12  2:24 AM      Result Value Range Status    MRSA by PCR NEGATIVE  NEGATIVE Final   Comment:            The GeneXpert MRSA Assay (FDA     approved for NASAL specimens     only), is one component of a     comprehensive MRSA colonization     surveillance program. It is not     intended to diagnose MRSA     infection nor to guide or     monitor treatment for     MRSA infections.  CULTURE, RESPIRATORY (NON-EXPECTORATED)     Status: None   Collection Time    12/30/12 11:51 AM      Result Value Range Status   Specimen Description TRACHEAL ASPIRATE   Final   Special Requests NONE   Final   Gram Stain     Final   Value: RARE WBC PRESENT, PREDOMINANTLY PMN     NO SQUAMOUS EPITHELIAL CELLS SEEN     NO ORGANISMS SEEN   Culture Non-Pathogenic Oropharyngeal-type Flora Isolated.   Final   Report Status 01/02/2013 FINAL   Final  CLOSTRIDIUM DIFFICILE BY PCR     Status: None   Collection Time    01/03/13  4:35 PM      Result Value Range Status   C difficile by pcr NEGATIVE  NEGATIVE Final  CULTURE, BAL-QUANTITATIVE     Status: None   Collection Time    01/04/13  3:35 PM      Result Value Range Status   Specimen Description BRONCHIAL ALVEOLAR LAVAGE   Final   Special Requests BRONCH WASH LL LOBE    Final   Gram Stain     Final   Value: RARE WBC PRESENT, PREDOMINANTLY PMN     NO SQUAMOUS EPITHELIAL CELLS SEEN     NO ORGANISMS SEEN   Colony Count 30,000 COLONIES/ML   Final   Culture     Final   Value: STAPHYLOCOCCUS AUREUS  Note: RIFAMPIN AND GENTAMICIN SHOULD NOT BE USED AS SINGLE DRUGS FOR TREATMENT OF STAPH INFECTIONS.   Report Status 01/07/2013 FINAL   Final   Organism ID, Bacteria STAPHYLOCOCCUS AUREUS   Final    Coagulation Studies: No results found for this basename: LABPROT, INR,  in the last 72 hours  Urinalysis: No results found for this basename: COLORURINE, APPERANCEUR, LABSPEC, PHURINE, GLUCOSEU, HGBUR, BILIRUBINUR, KETONESUR, PROTEINUR, UROBILINOGEN, NITRITE, LEUKOCYTESUR,  in the last 72 hours    Imaging: No  results found.   Medications:   . sodium chloride Stopped (01/01/13 1400)  . sodium chloride Stopped (12/31/12 2300)   . antiseptic oral rinse  15 mL Mouth Rinse TID WC & HS  . aspirin  325 mg Oral Daily  . calcium acetate  1,334 mg Oral TID WC  . carvedilol  6.25 mg Per NG tube BID WC  . [START ON 01/23/2013] cefUROXime (ZINACEF)  IV  1.5 g Intravenous On Call to OR  . darbepoetin (ARANESP) injection - NON-DIALYSIS  150 mcg Subcutaneous Q Mon-1800  . heparin subcutaneous  5,000 Units Subcutaneous Q8H  . insulin aspart  0-9 Units Subcutaneous TID AC & HS  . ketoconazole   Topical BID  . lanthanum  1,000 mg Oral TID WC  . multivitamin with minerals  1 tablet Oral Daily  . pantoprazole      . pantoprazole  40 mg Oral Daily  . simvastatin  20 mg Oral q1800   sodium chloride, feeding supplement (NEPRO CARB STEADY), fentaNYL, heparin, heparin, hydrALAZINE, hydrOXYzine, lidocaine (PF), lidocaine-prilocaine, ondansetron (ZOFRAN) IV, oxyCODONE, pentafluoroprop-tetrafluoroeth, traMADol  Assessment/ Plan:  1. AKI 2/2 ATN [unclear baseline] / cardiogenic shock, arrest, and contrast nephropathy: Started HD 7/25. Urine output reported to be improving.  2. Cardiogenic shock; per cards, improved  3. Hypoxic RF; resolved  4. Anemia; on aranesp, follow  5. MSSA on BAL  6. Hyperphosphatemia  I would prepare patient for ESRD management. No return of renal function anticipated soon.discharge plans to [facility] . Will follow creatinine over weekend if no improvement. We shall have AVF placed Monday      LOS: 22 Darwyn Ponzo W @TODAY @1 :27 PM

## 2013-01-20 NOTE — Progress Notes (Signed)
Physician Discharge Summary  Nicholas Potts ZOX:[id number] DOB: 22-Feb-1961 DOA: 12/29/2012  PCP: No primary provider on file.  Admit date: 12/29/2012 Discharge date: 01/20/2013  Time spent:40 minutes  Recommendations for Outpatient Follow-up:  1. Cardiogenic shock/acute CHF NYHA class III; resolved, echocardiogram from 01/11/2013 shows grade 1 diastolic dysfunction.   2. HTN; within AHA guidelines no change the meds require  3. Acute renal failure/ATN; now on hemodialysis T/TH/Sat; per   Dr Hyman Hopes (Nephrologist) I would prepare patient for ESRD management. No return of renal function anticipated soon. Will need placement of AVF possibly Monday with discharge plans to [facility]. Counseled patient and daughter that they were more than likely be here until at least Sunday  4. Accidental opiate poisoning; stable  5. Tinea Pedis; continue Ketoconazole and Pumice stone for Lt Foot, Hydroxyazine for pruritis.   6. OSA; patient doing well on hospital supplied CPAP, will require outpatient sleep study in order to reestablish his baseline.   Discharge Diagnoses:  Principal Problem:   Acute respiratory failure Active Problems:   Cardiogenic shock   Acute renal failure (ARF)   Cardiac and respiratory arrest with VT due to:   Aspiration pneumonia   Accidental opiate poisoning   ARF (acute renal failure) with tubular necrosis requiring HD   Acute systolic congestive heart failure, NYHA class 3   Anemia   Thrombocytopenia, unspecified   HTN (hypertension)   Tinea pedis   OSA on CPAP   Discharge Condition:   Diet recommendation:   Filed Weights   01/18/13 2041 01/19/13 0904 01/19/13 1240  Weight: 111.5 kg (245 lb 13 oz) 111 kg (244 lb 11.4 oz) 109.8 kg (242 lb 1 oz)    History of present illness:  54- yo HM PMHx Rt Achilles tendon reconstruction was brought in for apnea due to Unintentional narcotic overdose. Patient was found to have respiratory acidosis and  hypothermia requiring intubation in the ER. Subsequent course complicated by hypotension, profound bradycardia and became pulseless requiring CPR. Patient then went into V. tach requiring defibrillation and amiodarone. He was stabilized and transferred to ICU. Emergent echo done showed severe wall motion abnormality. He subsequently coded 2 more times and taken to cath lab immediately. Patient found to have severe cardiogenic shock and severely in the dysfunction with EF of 20%. Patient was placed on the impella dynamic support system along with pressors . Patient also developed oliguric renal failure related to shock for which a hemodialysis catheter was placed and CVVHD initiated. Patient had severe encephalopathy secondary to acute insult. Neurology was consulted and had an EEG done which showed moderate to severe generalized nonspecific slowing. Patient's mental status slowly started to improve and after successful extubation he was transferred to step down. Cardiomyopathy has resolved. ARF not resolving and still oliguric and nephrology continues to follow.TODAY patient somewhat lethargic following HD. Son answered all questions   Hospital Course:    Procedures:  Echocardiogram 01/11/2013 - Left ventricle: The cavity size was normal. Wall thickness was normal. Systolic function was normal. LVEF= 55% to 60%.  Doppler parameters are consistent with abnormal left ventricular relaxation (grade 1 diastolic dysfunction). - Aortic valve: There was no stenosis. - Mitral valve: No significant regurgitation. - Left atrium: The atrium was mildly dilated. - Right ventricle: The cavity size was normal. Systolic function was normal.   Consultations:  Nephrology, orthopedics, vascular surgery, cardiology, critical care  Discharge Exam: Filed Vitals:   01/20/13 0527 01/20/13 0715 01/20/13 1400 01/20/13 1736  BP:  113/65 164/77 125/65 137/55  Pulse: 88 82 73 82  Temp: 99.5 F (37.5 C) 98.4 F (36.9  C) 97.5 F (36.4 C) 98.7 F (37.1 C)  TempSrc: Oral Oral Oral Oral  Resp: 18 18 18 20   Height:      Weight:      SpO2: 94% 96%  98%    General: Alert, NAD Cardiovascular: Regular rhythm and rate, negative murmurs rubs or gallops, DP/TP pulses 2+ bilateral Respiratory: Clear to auscultation bilateral Muscle skeletal; left plantar surface of foot showed significant tinea pedis, right Achilles tendon repair no sign of infection Steri-Strips still in place, right chest wall Vas-Cath no sign of infection  Discharge Instructions     Medication List    ASK your doctor about these medications       aspirin 325 MG tablet  Take 325 mg by mouth daily.     meloxicam 15 MG tablet  Commonly known as:  MOBIC  Take 15 mg by mouth daily.     ONE-A-DAY 50 PLUS Tabs  Take 1 tablet by mouth daily.     oxycodone 5 MG capsule  Commonly known as:  OXY-IR  Take 10 mg by mouth every 4 (four) hours as needed for pain.     simvastatin 20 MG tablet  Commonly known as:  ZOCOR  Take 20 mg by mouth daily.     traMADol 50 MG tablet  Commonly known as:  ULTRAM  Take 50 mg by mouth daily.       No Known Allergies    The results of significant diagnostics from this hospitalization (including imaging, microbiology, ancillary and laboratory) are listed below for reference.    Significant Diagnostic Studies: Ct Head Wo Contrast  12/29/2012   *RADIOLOGY REPORT*  Clinical Data: Altered mental status.  Possible overdose.  CT HEAD WITHOUT CONTRAST  Technique:  Contiguous axial images were obtained from the base of the skull through the vertex without contrast.  Comparison: Head CT 12/10/2007.  Findings: There is no evidence of acute intracranial hemorrhage, mass lesion, brain edema or extra-axial fluid collection.  The ventricles and subarachnoid spaces are appropriately sized for age. There is no CT evidence of acute cortical infarction.  There is diffuse mucosal thickening throughout the nasal  passages, ethmoid and maxillary sinuses.  The nasopharyngeal airway is closed at the time of imaging (the patient is intubated).  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  The mastoids and middle ears are clear. The calvarium is intact.  IMPRESSION:  1.  No acute intracranial or calvarial findings identified. 2.  Diffuse mucosal thickening throughout the visualized nasal passages and paranasal sinuses. Significant maxillary periodontal disease bilaterally.   Original Report Authenticated By: Carey Bullocks, M.D.   Dg Chest Port 1 View  01/11/2013   *RADIOLOGY REPORT*  Clinical Data: Shortness of breath.  Pneumonia.  PORTABLE CHEST - 1 VIEW  Comparison: Plain film chest 01/08/2013 and 01/09/2013.  Findings: Dialysis catheter remains in place.  Lung volumes are low.  Mild left basilar atelectasis is identified.  There is no pulmonary edema.  No pneumothorax or pleural fluid is identified. Heart size is enlarged.  IMPRESSION:  Mild subsegmental atelectasis left lung base.  No acute abnormality.   Original Report Authenticated By: Holley Dexter, M.D.   Dg Chest Port 1 View  01/09/2013   *RADIOLOGY REPORT*  Clinical Data: Central catheter placement  PORTABLE CHEST - 1 VIEW  Comparison: Study obtained earlier in the day  Findings: Dual lumen catheter  tip is at the cavoatrial junction. Left-sided catheter tip is in the right atrium.  No pneumothorax.  There is mild atelectasis in the left base.  Elsewhere, lungs are clear.  Heart is upper normal in size with normal pulmonary vascularity.  No adenopathy.  IMPRESSION: Catheters as described without pneumothorax.  Mild left base atelectasis.   Original Report Authenticated By: Bretta Bang, M.D.   Dg Chest Port 1 View  01/09/2013   *RADIOLOGY REPORT*  Clinical Data: Follow up pneumonia  PORTABLE CHEST - 1 VIEW  Comparison: Prior radiograph from 01/08/2013  Findings: The the patient has been extubated in the interim.  Right IJ Cordis catheter  is stable in position.  Enteric tube is been removed.  Cardiac silhouette is unchanged.  The lungs remain hypoinflated.  There is been interval improvement in aeration of both lung bases with improved bibasilar opacities. Veiling opacity overlying the costophrenic angles are consistent with small pleural effusions, slightly larger on the left.  Osseous structures are unchanged.  IMPRESSION: 1.  Interval improvement in aeration of both lung bases with improved bibasilar opacities status post extubation. Particularly, dense opacity at the left lung base is improved, which may represent improved atelectasis, pleural fluid,  and / or infiltrate. 2.  Small bilateral pleural effusions, left greater than right.   Original Report Authenticated By: Rise Mu, M.D.   Dg Chest Port 1 View  01/08/2013   *RADIOLOGY REPORT*  Clinical Data: Follow up pneumonia  PORTABLE CHEST - 1 VIEW  Comparison: Prior chest x-ray 01/07/2013  Findings: The endotracheal tube is 3.5 cm above the carina.  Left subclavian approach central venous catheter with the tip at the superior cavoatrial junction.  The right IJ approach temporary hemodialysis catheter is noted with the tip in the upper SVC. Visualized nasogastric tube in unchanged position.  Cardiac and mediastinal contours are unchanged.  Inspiratory volumes remain very low and there is left basilar atelectasis versus infiltrate. Small right pleural effusion is improving and there is improved aeration in the right base.  IMPRESSION:  1.  Stable and satisfactory support apparatus. 2.  Similar appearance of dense left basilar opacity which may represent a combination of pleural fluid with atelectasis and / or infiltrate. 3.  Improved aeration in the right base with decreasing pleural fluid and opacity.   Original Report Authenticated By: Malachy Moan, M.D.   Dg Chest Port 1 View  01/07/2013   *RADIOLOGY REPORT*  Clinical Data: Assess infiltrate  PORTABLE CHEST - 1 VIEW   Comparison: Prior radiograph from 01/06/2013  Findings: The the patient remains intubated with the tip of the endotracheal tube located 2.9 cm above the carina.  Enteric tube courses into the abdomen, and appears coiled within the gastric fundus, similar to prior. Right IJ central venous catheter is unchanged.  Left subclavian central venous catheter is in stable position with tip at cavoatrial junction.  Heart size is unchanged.  The lungs remain hypoinflated.  Bilateral layering pleural effusions persist, likely capping the right lung apex.  Associated bibasilar airspace opacities are also not significantly changed, which may read represent atelectasis or infiltrate.  No overt pulmonary edema.  No pneumothorax.  The bony structures are intact.  IMPRESSION: 1.  Support apparatus as above.  Tip of endotracheal tube 2.9 cm above the carina. 2.  Similar appearance of the chest with persistent hypoinflation with bilateral pleural effusions and associated bibasilar airspace opacities, which may reflect atelectasis or infiltrate.   Original Report Authenticated By: Rise Mu, M.D.  Dg Chest Port 1 View  01/06/2013   *RADIOLOGY REPORT*  Clinical Data: Pleural effusions.  PORTABLE CHEST - 1 VIEW  Comparison: 01/05/2013.  Findings: Endotracheal tube ends 2 cm above the carina.  Gastric suction tube is coiled in the stomach.  The right IJ sheath has been retracted slightly, still in good position at the level of the upper SVC. Left subclavian central venous catheter, tip at the level of the superior cavoatrial junction.  Unchanged heart size and mediastinal contours, distorted by low lung volumes.  Layering bilateral pleural effusions, likely capping the apical lungs.  There is likely also a lung opacity at the left base. No evident pneumothorax.  IMPRESSION:  1.  Satisfactory positioning of tubes and lines.  Please note that the right IJ catheter has been retracted since yesterday. 2. Low volume lungs with  bilateral pleural effusions and left base atelectasis or infiltrate.   Original Report Authenticated By: Tiburcio Pea   Dg Chest Port 1 View  01/05/2013   *RADIOLOGY REPORT*  Clinical Data: Atelectasis.  PORTABLE CHEST - 1 VIEW  Comparison: 01/04/2013.  Findings: The support apparatus is unchanged.  Endotracheal tube tip is 29 mm from the carina.  Pulmonary aeration is improved compared yesterday's exam with decreasing basilar atelectasis. Positional shift in the pleural effusions.  Pulmonary vascular congestion persists.  Cardiopericardial silhouette grossly appears unchanged.  IMPRESSION:  1.  Stable support apparatus. 2.  Improving pulmonary aeration with positional shift in the pleural effusions.  Overall low volume chest.   Original Report Authenticated By: Andreas Newport, M.D.   Dg Chest Port 1 View  01/04/2013   *RADIOLOGY REPORT*  Clinical Data: Evaluate endotracheal tube position.  Infiltrate and atelectasis.  PORTABLE CHEST - 1 VIEW  Comparison: 01/03/2013.  Findings: Endotracheal tube tip is 28 mm from the carina.  Right IJ vas cath.  Enteric tube is present with the tip not visualized. There is probably a loop of the enteric tube in the proximal fundus of the stomach.  Markedly low lung volumes remain present with basilar atelectasis and diffuse bilateral basilar predominant airspace disease most compatible with pulmonary edema.  Bilateral pleural effusions are also present.  Left subclavian central line appears similar with the tip in the mid-to-lower SVC.  IMPRESSION:  1.  Endotracheal tube tip 28 mm from the carina. 2.  Other support apparatus unchanged. 3.  Unchanged pulmonary aeration with bilateral airspace disease/edema, pleural effusions and atelectasis.   Original Report Authenticated By: Andreas Newport, M.D.   Dg Chest Port 1 View  01/03/2013   *RADIOLOGY REPORT*  Clinical Data: Endotracheal tube placement  PORTABLE CHEST - 1 VIEW  Comparison: Prior chest x-ray 01/02/2013  Findings:  The tip of the endotracheal tube is 1.5 cm above the carina.  The patient is in a kyphotic position.  The right IJ temporary hemodialysis catheter tip projects over the distal SVC. The tip of the left subclavian approach central venous catheter projects over the superior cavoatrial junction. The nasogastric tube can be seen coiled within the gastric fundus.  Inspiratory volumes are very low.  There is mild pulmonary edema and diffuse bilateral interstitial and airspace opacities.  Probable bilateral layering effusions.  Stable cardiomegaly.  IMPRESSION:  1.  The tip of the endotracheal tube is 1.5 cm above the carina. Relatively distal placement of the endotracheal tube likely exaggerated by the patient is kyphotic positioning. 2.  Other support apparatus in stable and satisfactory position. 3.  Similar appearance of the lungs with very low  inspiratory volumes, edema, bilateral layering effusions and infiltrate versus atelectasis.   Original Report Authenticated By: Malachy Moan, M.D.   Dg Chest Port 1 View  01/02/2013   *RADIOLOGY REPORT*  Clinical Data: Endotracheal tube position  PORTABLE CHEST - 1 VIEW  Comparison: 01/01/2013  Findings: Endotracheal tube is appropriately positioned.  Insert NG right IJ central line tip terminates over the distal SVC.  Mild cardiomegaly process of central vascular congestion and small pleural effusions obscuring lung bases.  IMPRESSION: Support apparatus as above.  Central vascular congestion with small pleural effusions.   Original Report Authenticated By: Christiana Pellant, M.D.   Dg Chest Port 1 View  01/01/2013   *RADIOLOGY REPORT*  Clinical Data: Hypoxia  PORTABLE CHEST - 1 VIEW  Comparison: December 31, 2012  Findings:  Endotracheal tube tip is 2.5 cm above the carina. Central catheters are unchanged in position. Nasogastric tube tip and side port are in the stomach region.  No apparent pneumothorax.  There is consolidation in the left lower lobe with small left  effusion.  The right lung is clear.  Heart is borderline prominent with normal pulmonary vascularity.  No appreciable adenopathy.  IMPRESSION:  Tube and catheter positions as described.  No pneumothorax. There is consolidation in the left lower lobe with small left effusion.  Right lung clear.   Original Report Authenticated By: Bretta Bang, M.D.   Dg Chest Port 1 View  12/31/2012   *RADIOLOGY REPORT*  Clinical Data: Critically ill patient with possible change in position of one of the indwelling support apparatus, the Impella device.  PORTABLE CHEST - 1 VIEW 12/31/2012 1141 hours:  Comparison: Portable chest x-ray earlier same date 0608 hours.  Findings: The Impella device which has been placed from a femoral approach is unchanged in position since the examination earlier today, with the distal hook in the left ventricle and the device traversing the aortic valve.  Endotracheal tube tip somewhat low, chest approximately 2 cm above the carina, as noted previously. Remaining support apparatus satisfactory with the Swan-Ganz catheter tip overlying the proximal right main pulmonary artery, the right jugular central venous catheter tip overlying the lower SVC, the left subclavian central venous catheter tip projecting over the upper SVC, and the nasogastric tube looped in the stomach. External pacing pads.  Markedly suboptimal inspiration with atelectasis in the lower lobes, right greater than left, unchanged. Pulmonary venous hypertension without overt edema, unchanged.  No new pulmonary parenchymal abnormalities.  IMPRESSION:  1.  Support apparatus satisfactory with the exception that the endotracheal tube tip is low, just about 2 cm above the carina. Specifically, no change in the position of the Impella device since earlier same date. 2.  Suboptimal inspiration with atelectasis in the lung bases, right greater than left, unchanged since earlier in the day. 3.  Pulmonary venous hypertension without overt  edema, also unchanged. 4.  No new pulmonary parenchymal abnormalities.   Original Report Authenticated By: Hulan Saas, M.D.   Dg Chest Port 1 View  12/31/2012   *RADIOLOGY REPORT*  Clinical Data: Endotracheal tube placement  PORTABLE CHEST - 1 VIEW  Comparison: 12/30/2012  Findings: Endotracheal tube tip is 1.1 cm from the carina.  NG tube stable.  Swan-Ganz catheter from the abdomen is stable with its tip in the right hilum.  Right internal jugular dialysis catheter is stable.  Left subclavian central venous catheter is stable.  Low volumes, bilateral pleural effusions, and mild edema are not significantly changed.  No pneumothorax.  IMPRESSION: Stable pleural  effusions and edema.   Original Report Authenticated By: Jolaine Click, M.D.   Dg Chest Port 1 View  12/30/2012   *RADIOLOGY REPORT*  Clinical Data: Catheter placement  PORTABLE CHEST - 1 VIEW  Comparison: Same date.  Findings: No change is noted in position of endotracheal or nasogastric tubes.  Left subclavian catheter is unchanged in position with tip in SVC.  There has been interval placement of right internal jugular dialysis catheter with distal tip in expected position of the SVC.  No pneumothorax is noted. Hypoinflation of the lungs is noted.  Central pulmonary vascular congestion is noted.  IMPRESSION: Status post placement of right internal jugular dialysis catheter with tip in expected position of the SVC.  No pneumothorax is seen.   Original Report Authenticated By: Lupita Raider.,  M.D.   Dg Chest Port 1 View  12/30/2012   *RADIOLOGY REPORT*  Clinical Data: Endotracheal tube placement.  PORTABLE CHEST - 1 VIEW  Comparison: 12/29/2012.  Findings: Low volume chest.  The endotracheal tube tip is 24 mm from the carina.  Defibrillator pads over the chest.  Enteric tube is present with the tip not visualized.  Inferior presumed femoral approach Swan-Ganz catheter is present with the tip in the descending right pulmonary artery.   Progressive atelectasis/volume loss in the right middle and right lower lobes.  Prominent atelectasis associated with lower lung volumes.  Pulmonary vascular congestion is accentuated by the low volumes. Left subclavian central line remains present with the tip in the mid to lower SVC.  IMPRESSION:  1.  Stable support apparatus aside from the addition of what appears to be a femoral approach Swan-Ganz catheter with the tip in the descending right pulmonary artery. 2.  Lower lung volumes with increasing atelectasis, most prominent at the right base.   Original Report Authenticated By: Andreas Newport, M.D.   Dg Chest Portable 1 View  12/29/2012   *RADIOLOGY REPORT*  Clinical Data: Unresponsive  PORTABLE CHEST - 1 VIEW  Comparison: Chest radiograph 12/29/2012  Findings: Endotracheal tube is 3.5 cm from carina.  Left central venous line is unchanged.  Stable cardiac silhouette.  There is central venous congestion similar prior.  There is bibasilar atelectasis greater on the right not changed.  IMPRESSION:  1.  Stable support apparatus. 2.  No interval change.  3.  Low lung volumes, basilar atelectasis, and central venous congestion.   Original Report Authenticated By: Genevive Bi, M.D.   Dg Chest Port 1 View  12/29/2012   *RADIOLOGY REPORT*  Clinical Data: Evaluate endotracheal tube and left subclavian line placement  PORTABLE CHEST - 1 VIEW  Comparison: Earlier same day  Findings:  Grossly unchanged enlarged cardiac silhouette and mediastinal contours, possibly attributable to decreased lung volumes and AP projection.  An endotracheal tube overlies the tracheal air column with tip superior to the carina.  Interval placement of a left subclavian vein approach central venous catheter with tip projecting over the superior cavoatrial junction.  Enteric tube tip and side port project below the left hemidiaphragm.  No pneumothorax.  Pulmonary vasculature is indistinct with cephalization of flow.  Small/trace  bilateral effusions are suspected with minimal amount of fluid layering within the right minor fissure.  Worsening perihilar opacities.  Unchanged bones.  IMPRESSION: 1.  Appropriately positioned support apparatus as above.  No pneumothorax. 2.  Suspected pulmonary edema. 3.  Reduced lung volumes with worsening perihilar opacities, likely atelectasis.  findings suggestive of pulmonary edema   Original Report Authenticated By: Simonne Come  V, MD   Dg Chest Portable 1 View  12/29/2012   *RADIOLOGY REPORT*  Clinical Data: Status post intubation  PORTABLE CHEST - 1 VIEW  Comparison: None.  Findings: An endotracheal tube and nasogastric catheter are noted. Nasogastric catheter appears coiled within the distal esophagus. The endotracheal tube is in satisfactory position.  The lungs are well-aerated with some mild right basilar atelectasis.   IMPRESSION: Tubes and lines as described above.  Mild right basilar atelectasis.   Original Report Authenticated By: Alcide Clever, M.D.   Dg Abd Portable 1v  12/30/2012   *RADIOLOGY REPORT*  Clinical Data: NG tube placement  PORTABLE ABDOMEN - 1 VIEW  Comparison: None.  Findings: The NG tube is curled in the fundus of the stomach.  The tip is in the body.  No disproportionate dilatation of bowel. Multiple linear devices project over the abdomen.  IMPRESSION: NG tube is coiled in the stomach   Original Report Authenticated By: Jolaine Click, M.D.    Microbiology: No results found for this or any previous visit (from the past 240 hour(s)).   Labs: Basic Metabolic Panel:  Recent Labs Lab 01/17/13 0545 01/17/13 0830 01/18/13 0602 01/19/13 0435 01/20/13 0640  NA 134* 132* 135 134* 139  K 5.7* 5.2* 5.5* 5.4* 4.9  CL 94* 93* 95* 94* 100  CO2 22 22 29 25 28   GLUCOSE 88 164* 84 84 82  BUN 89* 90* 58* 74* 40*  CREATININE 13.81* 13.77* 11.18* 13.52* 9.45*  CALCIUM 8.8 8.7 8.7 8.6 8.7  MG  --   --   --  2.7* 2.4  PHOS 7.2* 7.0* 6.2* 5.3* 4.6   Liver Function  Tests:  Recent Labs Lab 01/17/13 0545 01/17/13 0830 01/18/13 0602 01/19/13 0435 01/20/13 0640  AST  --   --   --  19 19  ALT  --   --   --  23 20  ALKPHOS  --   --   --  95 82  BILITOT  --   --   --  0.3 0.3  PROT  --   --   --  6.9 6.2  ALBUMIN 2.7* 2.7* 2.7* 2.7* 2.5*   No results found for this basename: LIPASE, AMYLASE,  in the last 168 hours No results found for this basename: AMMONIA,  in the last 168 hours CBC:  Recent Labs Lab 01/14/13 0500 01/15/13 0500 01/17/13 0808 01/19/13 0435 01/20/13 0640  WBC 14.1* 13.0* 12.3* 11.5* 10.5  NEUTROABS  --   --   --  7.3 6.1  HGB 9.9* 9.8* 9.1* 8.8* 8.7*  HCT 31.2* 30.8* 28.1* 27.8* 28.5*  MCV 98.1 98.7 95.6 97.2 99.0  PLT 432* 425* 384 340 327   Cardiac Enzymes: No results found for this basename: CKTOTAL, CKMB, CKMBINDEX, TROPONINI,  in the last 168 hours BNP: BNP (last 3 results)  Recent Labs  12/30/12 0500  PROBNP 3296.0*   CBG:  Recent Labs Lab 01/19/13 1321 01/19/13 1630 01/19/13 2058 01/20/13 0726 01/20/13 1115  GLUCAP 76 157* 92 82 161*       Signed:  WOODS, CURTIS, J  [facility] 01/20/2013, 6:04 PM

## 2013-01-21 LAB — CBC WITH DIFFERENTIAL/PLATELET
Basophils Absolute: 0.2 10*3/uL — ABNORMAL HIGH (ref 0.0–0.1)
Basophils Relative: 2 % — ABNORMAL HIGH (ref 0–1)
Eosinophils Absolute: 1.5 10*3/uL — ABNORMAL HIGH (ref 0.0–0.7)
Eosinophils Relative: 15 % — ABNORMAL HIGH (ref 0–5)
Lymphs Abs: 1.5 10*3/uL (ref 0.7–4.0)
MCH: 30.9 pg (ref 26.0–34.0)
MCHC: 31.4 g/dL (ref 30.0–36.0)
MCV: 98.2 fL (ref 78.0–100.0)
Neutrophils Relative %: 56 % (ref 43–77)
Platelets: 330 10*3/uL (ref 150–400)
RBC: 2.85 MIL/uL — ABNORMAL LOW (ref 4.22–5.81)
RDW: 13.8 % (ref 11.5–15.5)

## 2013-01-21 LAB — COMPREHENSIVE METABOLIC PANEL
ALT: 19 U/L (ref 0–53)
Albumin: 2.6 g/dL — ABNORMAL LOW (ref 3.5–5.2)
Alkaline Phosphatase: 81 U/L (ref 39–117)
Calcium: 8.8 mg/dL (ref 8.4–10.5)
GFR calc Af Amer: 5 mL/min — ABNORMAL LOW (ref >90)
Glucose, Bld: 80 mg/dL (ref 70–99)
Potassium: 5.3 mEq/L — ABNORMAL HIGH (ref 3.5–5.1)
Sodium: 137 mEq/L (ref 135–145)
Total Protein: 6.2 g/dL (ref 6.0–8.3)

## 2013-01-21 LAB — GLUCOSE, CAPILLARY: Glucose-Capillary: 101 mg/dL — ABNORMAL HIGH (ref 70–99)

## 2013-01-21 LAB — PHOSPHORUS: Phosphorus: 5.2 mg/dL — ABNORMAL HIGH (ref 2.3–4.6)

## 2013-01-21 MED ORDER — NEPRO/CARBSTEADY PO LIQD: 237.0000 mL | ORAL | Status: DC | PRN

## 2013-01-21 MED ORDER — ALTEPLASE 2 MG IJ SOLR
2.0000 mg | Freq: Once | INTRAMUSCULAR | Status: DC | PRN
  Filled 2013-01-21: qty 2

## 2013-01-21 MED ORDER — LIDOCAINE-PRILOCAINE 2.5-2.5 % EX CREA: 1.0000 "application " | TOPICAL_CREAM | CUTANEOUS | Status: DC | PRN

## 2013-01-21 MED ORDER — SODIUM CHLORIDE 0.9 % IV SOLN: 100.0000 mL | INTRAVENOUS | Status: DC | PRN

## 2013-01-21 MED ORDER — PENTAFLUOROPROP-TETRAFLUOROETH EX AERO: 1.0000 "application " | INHALATION_SPRAY | CUTANEOUS | Status: DC | PRN

## 2013-01-21 MED ORDER — LIDOCAINE HCL (PF) 1 % IJ SOLN: 5.0000 mL | INTRAMUSCULAR | Status: DC | PRN

## 2013-01-21 MED ORDER — HEPARIN SODIUM (PORCINE) 1000 UNIT/ML DIALYSIS
1000.0000 [IU] | INTRAMUSCULAR | Status: DC | PRN
  Filled 2013-01-21: qty 1

## 2013-01-21 NOTE — Progress Notes (Signed)
Patient placed himself on cpap. Patient is tolerating cpap well at this time. 

## 2013-01-21 NOTE — Progress Notes (Signed)
Weekend CSW spoke with RN Synetta Fail who stated that patient may be ready for d/c on 8/16 if patient can have OR procedure completed on outpatient basis. RN will contact weekend CSW if pt is able to discharge today to [facility].  Samuella Bruin, LCSWA [facility]. [phone]

## 2013-01-21 NOTE — Progress Notes (Signed)
Physician Discharge Summary  Nicholas Potts ZOX:[id number] DOB: 1960/12/12 DOA: 12/29/2012  PCP: No primary provider on file.  Admit date: 12/29/2012 Discharge date: 01/21/2013  Time spent:40 minutes  Recommendations for Outpatient Follow-up:  1. Cardiogenic shock/acute CHF NYHA class III; resolved, echocardiogram from 01/11/2013 shows grade 1 diastolic dysfunction.   2. HTN; within AHA guidelines no change the meds require  3. Acute renal failure/ATN; now on hemodialysis T/TH/Sat; per   Dr Hyman Hopes (Nephrologist) I would prepare patient for ESRD management. No return of renal function anticipated soon. Will need placement of AVF possibly Monday with discharge plans to [facility]TS. Will contact Dr. Hyman Hopes in the a.m. there appears to be confusion as to if patient is going to obtain his AVF on Monday  4. Accidental opiate poisoning; stable  5. Tinea Pedis; continue Ketoconazole and Pumice stone for Lt Foot, Hydroxyazine for pruritis.   6. OSA; patient doing well on hospital supplied CPAP, will require outpatient sleep study in order to reestablish his baseline.   Discharge Diagnoses:  Principal Problem:   Acute respiratory failure Active Problems:   Cardiogenic shock   Acute renal failure (ARF)   Cardiac and respiratory arrest with VT due to:   Aspiration pneumonia   Accidental opiate poisoning   ARF (acute renal failure) with tubular necrosis requiring HD   Acute systolic congestive heart failure, NYHA class 3   Anemia   Thrombocytopenia, unspecified   HTN (hypertension)   Tinea pedis   OSA on CPAP   Discharge Condition:   Diet recommendation:   Filed Weights   01/19/13 1240 01/20/13 2156 01/21/13 0900  Weight: 109.8 kg (242 lb 1 oz) 113.127 kg (249 lb 6.4 oz) 114 kg (251 lb 5.2 oz)    History of present illness:  75- yo HM PMHx Rt Achilles tendon reconstruction was brought in for apnea due to Unintentional narcotic overdose. Patient was found to  have respiratory acidosis and hypothermia requiring intubation in the ER. Subsequent course complicated by hypotension, profound bradycardia and became pulseless requiring CPR. Patient then went into V. tach requiring defibrillation and amiodarone. He was stabilized and transferred to ICU. Emergent echo done showed severe wall motion abnormality. He subsequently coded 2 more times and taken to cath lab immediately. Patient found to have severe cardiogenic shock and severely in the dysfunction with EF of 20%. Patient was placed on the impella dynamic support system along with pressors . Patient also developed oliguric renal failure related to shock for which a hemodialysis catheter was placed and CVVHD initiated. Patient had severe encephalopathy secondary to acute insult. Neurology was consulted and had an EEG done which showed moderate to severe generalized nonspecific slowing. Patient's mental status slowly started to improve and after successful extubation he was transferred to step down. Cardiomyopathy has resolved. ARF not resolving and still oliguric and nephrology continues to follow.TODAY patient somewhat lethargic following HD. Son answered all questions   Hospital Course:    Procedures:  Echocardiogram 01/11/2013 - Left ventricle: The cavity size was normal. Wall thickness was normal. Systolic function was normal. LVEF= 55% to 60%.  Doppler parameters are consistent with abnormal left ventricular relaxation (grade 1 diastolic dysfunction). - Aortic valve: There was no stenosis. - Mitral valve: No significant regurgitation. - Left atrium: The atrium was mildly dilated. - Right ventricle: The cavity size was normal. Systolic function was normal.   Consultations:  Nephrology, orthopedics, vascular surgery, cardiology, critical care  Discharge Exam: Filed Vitals:   01/21/13 1100  01/21/13 1139 01/21/13 1602 01/21/13 2143  BP: 114/57 105/61 109/64 109/62  Pulse: 82 82 84 77  Temp:   98.5 F (36.9 C) 98.7 F (37.1 C) 98.5 F (36.9 C)  TempSrc:  Oral Oral Oral  Resp:  16 17 20   Height:      Weight:      SpO2:  99% 100% 99%    General: Alert, NAD Cardiovascular: Regular rhythm and rate, negative murmurs rubs or gallops, DP/TP pulses 2+ bilateral Respiratory: Clear to auscultation bilateral Muscle skeletal; left plantar surface of foot showed significant tinea pedis, right Achilles tendon repair no sign of infection Steri-Strips still in place, right chest wall Vas-Cath no sign of infection  Discharge Instructions     Medication List    ASK your doctor about these medications       aspirin 325 MG tablet  Take 325 mg by mouth daily.     meloxicam 15 MG tablet  Commonly known as:  MOBIC  Take 15 mg by mouth daily.     ONE-A-DAY 50 PLUS Tabs  Take 1 tablet by mouth daily.     oxycodone 5 MG capsule  Commonly known as:  OXY-IR  Take 10 mg by mouth every 4 (four) hours as needed for pain.     simvastatin 20 MG tablet  Commonly known as:  ZOCOR  Take 20 mg by mouth daily.     traMADol 50 MG tablet  Commonly known as:  ULTRAM  Take 50 mg by mouth daily.       No Known Allergies    The results of significant diagnostics from this hospitalization (including imaging, microbiology, ancillary and laboratory) are listed below for reference.    Significant Diagnostic Studies: Ct Head Wo Contrast  12/29/2012   *RADIOLOGY REPORT*  Clinical Data: Altered mental status.  Possible overdose.  CT HEAD WITHOUT CONTRAST  Technique:  Contiguous axial images were obtained from the base of the skull through the vertex without contrast.  Comparison: Head CT 12/10/2007.  Findings: There is no evidence of acute intracranial hemorrhage, mass lesion, brain edema or extra-axial fluid collection.  The ventricles and subarachnoid spaces are appropriately sized for age. There is no CT evidence of acute cortical infarction.  There is diffuse mucosal thickening throughout the  nasal passages, ethmoid and maxillary sinuses.  The nasopharyngeal airway is closed at the time of imaging (the patient is intubated).  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  The mastoids and middle ears are clear. The calvarium is intact.  IMPRESSION:  1.  No acute intracranial or calvarial findings identified. 2.  Diffuse mucosal thickening throughout the visualized nasal passages and paranasal sinuses. Significant maxillary periodontal disease bilaterally.   Original Report Authenticated By: Carey Bullocks, M.D.   Dg Chest Port 1 View  01/11/2013   *RADIOLOGY REPORT*  Clinical Data: Shortness of breath.  Pneumonia.  PORTABLE CHEST - 1 VIEW  Comparison: Plain film chest 01/08/2013 and 01/09/2013.  Findings: Dialysis catheter remains in place.  Lung volumes are low.  Mild left basilar atelectasis is identified.  There is no pulmonary edema.  No pneumothorax or pleural fluid is identified. Heart size is enlarged.  IMPRESSION:  Mild subsegmental atelectasis left lung base.  No acute abnormality.   Original Report Authenticated By: Holley Dexter, M.D.   Dg Chest Port 1 View  01/09/2013   *RADIOLOGY REPORT*  Clinical Data: Central catheter placement  PORTABLE CHEST - 1 VIEW  Comparison: Study obtained earlier in the day  Findings: Dual lumen catheter tip is at the cavoatrial junction. Left-sided catheter tip is in the right atrium.  No pneumothorax.  There is mild atelectasis in the left base.  Elsewhere, lungs are clear.  Heart is upper normal in size with normal pulmonary vascularity.  No adenopathy.  IMPRESSION: Catheters as described without pneumothorax.  Mild left base atelectasis.   Original Report Authenticated By: Bretta Bang, M.D.   Dg Chest Port 1 View  01/09/2013   *RADIOLOGY REPORT*  Clinical Data: Follow up pneumonia  PORTABLE CHEST - 1 VIEW  Comparison: Prior radiograph from 01/08/2013  Findings: The the patient has been extubated in the interim.  Right IJ Cordis  catheter is stable in position.  Enteric tube is been removed.  Cardiac silhouette is unchanged.  The lungs remain hypoinflated.  There is been interval improvement in aeration of both lung bases with improved bibasilar opacities. Veiling opacity overlying the costophrenic angles are consistent with small pleural effusions, slightly larger on the left.  Osseous structures are unchanged.  IMPRESSION: 1.  Interval improvement in aeration of both lung bases with improved bibasilar opacities status post extubation. Particularly, dense opacity at the left lung base is improved, which may represent improved atelectasis, pleural fluid,  and / or infiltrate. 2.  Small bilateral pleural effusions, left greater than right.   Original Report Authenticated By: Rise Mu, M.D.   Dg Chest Port 1 View  01/08/2013   *RADIOLOGY REPORT*  Clinical Data: Follow up pneumonia  PORTABLE CHEST - 1 VIEW  Comparison: Prior chest x-ray 01/07/2013  Findings: The endotracheal tube is 3.5 cm above the carina.  Left subclavian approach central venous catheter with the tip at the superior cavoatrial junction.  The right IJ approach temporary hemodialysis catheter is noted with the tip in the upper SVC. Visualized nasogastric tube in unchanged position.  Cardiac and mediastinal contours are unchanged.  Inspiratory volumes remain very low and there is left basilar atelectasis versus infiltrate. Small right pleural effusion is improving and there is improved aeration in the right base.  IMPRESSION:  1.  Stable and satisfactory support apparatus. 2.  Similar appearance of dense left basilar opacity which may represent a combination of pleural fluid with atelectasis and / or infiltrate. 3.  Improved aeration in the right base with decreasing pleural fluid and opacity.   Original Report Authenticated By: Malachy Moan, M.D.   Dg Chest Port 1 View  01/07/2013   *RADIOLOGY REPORT*  Clinical Data: Assess infiltrate  PORTABLE CHEST - 1 VIEW   Comparison: Prior radiograph from 01/06/2013  Findings: The the patient remains intubated with the tip of the endotracheal tube located 2.9 cm above the carina.  Enteric tube courses into the abdomen, and appears coiled within the gastric fundus, similar to prior. Right IJ central venous catheter is unchanged.  Left subclavian central venous catheter is in stable position with tip at cavoatrial junction.  Heart size is unchanged.  The lungs remain hypoinflated.  Bilateral layering pleural effusions persist, likely capping the right lung apex.  Associated bibasilar airspace opacities are also not significantly changed, which may read represent atelectasis or infiltrate.  No overt pulmonary edema.  No pneumothorax.  The bony structures are intact.  IMPRESSION: 1.  Support apparatus as above.  Tip of endotracheal tube 2.9 cm above the carina. 2.  Similar appearance of the chest with persistent hypoinflation with bilateral pleural effusions and associated bibasilar airspace opacities, which may reflect atelectasis or infiltrate.   Original Report Authenticated By:  Rise Mu, M.D.   Dg Chest Port 1 View  01/06/2013   *RADIOLOGY REPORT*  Clinical Data: Pleural effusions.  PORTABLE CHEST - 1 VIEW  Comparison: 01/05/2013.  Findings: Endotracheal tube ends 2 cm above the carina.  Gastric suction tube is coiled in the stomach.  The right IJ sheath has been retracted slightly, still in good position at the level of the upper SVC. Left subclavian central venous catheter, tip at the level of the superior cavoatrial junction.  Unchanged heart size and mediastinal contours, distorted by low lung volumes.  Layering bilateral pleural effusions, likely capping the apical lungs.  There is likely also a lung opacity at the left base. No evident pneumothorax.  IMPRESSION:  1.  Satisfactory positioning of tubes and lines.  Please note that the right IJ catheter has been retracted since yesterday. 2. Low volume lungs with  bilateral pleural effusions and left base atelectasis or infiltrate.   Original Report Authenticated By: Tiburcio Pea   Dg Chest Port 1 View  01/05/2013   *RADIOLOGY REPORT*  Clinical Data: Atelectasis.  PORTABLE CHEST - 1 VIEW  Comparison: 01/04/2013.  Findings: The support apparatus is unchanged.  Endotracheal tube tip is 29 mm from the carina.  Pulmonary aeration is improved compared yesterday's exam with decreasing basilar atelectasis. Positional shift in the pleural effusions.  Pulmonary vascular congestion persists.  Cardiopericardial silhouette grossly appears unchanged.  IMPRESSION:  1.  Stable support apparatus. 2.  Improving pulmonary aeration with positional shift in the pleural effusions.  Overall low volume chest.   Original Report Authenticated By: Andreas Newport, M.D.   Dg Chest Port 1 View  01/04/2013   *RADIOLOGY REPORT*  Clinical Data: Evaluate endotracheal tube position.  Infiltrate and atelectasis.  PORTABLE CHEST - 1 VIEW  Comparison: 01/03/2013.  Findings: Endotracheal tube tip is 28 mm from the carina.  Right IJ vas cath.  Enteric tube is present with the tip not visualized. There is probably a loop of the enteric tube in the proximal fundus of the stomach.  Markedly low lung volumes remain present with basilar atelectasis and diffuse bilateral basilar predominant airspace disease most compatible with pulmonary edema.  Bilateral pleural effusions are also present.  Left subclavian central line appears similar with the tip in the mid-to-lower SVC.  IMPRESSION:  1.  Endotracheal tube tip 28 mm from the carina. 2.  Other support apparatus unchanged. 3.  Unchanged pulmonary aeration with bilateral airspace disease/edema, pleural effusions and atelectasis.   Original Report Authenticated By: Andreas Newport, M.D.   Dg Chest Port 1 View  01/03/2013   *RADIOLOGY REPORT*  Clinical Data: Endotracheal tube placement  PORTABLE CHEST - 1 VIEW  Comparison: Prior chest x-ray 01/02/2013  Findings:  The tip of the endotracheal tube is 1.5 cm above the carina.  The patient is in a kyphotic position.  The right IJ temporary hemodialysis catheter tip projects over the distal SVC. The tip of the left subclavian approach central venous catheter projects over the superior cavoatrial junction. The nasogastric tube can be seen coiled within the gastric fundus.  Inspiratory volumes are very low.  There is mild pulmonary edema and diffuse bilateral interstitial and airspace opacities.  Probable bilateral layering effusions.  Stable cardiomegaly.  IMPRESSION:  1.  The tip of the endotracheal tube is 1.5 cm above the carina. Relatively distal placement of the endotracheal tube likely exaggerated by the patient is kyphotic positioning. 2.  Other support apparatus in stable and satisfactory position. 3.  Similar appearance of  the lungs with very low inspiratory volumes, edema, bilateral layering effusions and infiltrate versus atelectasis.   Original Report Authenticated By: Malachy Moan, M.D.   Dg Chest Port 1 View  01/02/2013   *RADIOLOGY REPORT*  Clinical Data: Endotracheal tube position  PORTABLE CHEST - 1 VIEW  Comparison: 01/01/2013  Findings: Endotracheal tube is appropriately positioned.  Insert NG right IJ central line tip terminates over the distal SVC.  Mild cardiomegaly process of central vascular congestion and small pleural effusions obscuring lung bases.  IMPRESSION: Support apparatus as above.  Central vascular congestion with small pleural effusions.   Original Report Authenticated By: Christiana Pellant, M.D.   Dg Chest Port 1 View  01/01/2013   *RADIOLOGY REPORT*  Clinical Data: Hypoxia  PORTABLE CHEST - 1 VIEW  Comparison: December 31, 2012  Findings:  Endotracheal tube tip is 2.5 cm above the carina. Central catheters are unchanged in position. Nasogastric tube tip and side port are in the stomach region.  No apparent pneumothorax.  There is consolidation in the left lower lobe with small left  effusion.  The right lung is clear.  Heart is borderline prominent with normal pulmonary vascularity.  No appreciable adenopathy.  IMPRESSION:  Tube and catheter positions as described.  No pneumothorax. There is consolidation in the left lower lobe with small left effusion.  Right lung clear.   Original Report Authenticated By: Bretta Bang, M.D.   Dg Chest Port 1 View  12/31/2012   *RADIOLOGY REPORT*  Clinical Data: Critically ill patient with possible change in position of one of the indwelling support apparatus, the Impella device.  PORTABLE CHEST - 1 VIEW 12/31/2012 1141 hours:  Comparison: Portable chest x-ray earlier same date 0608 hours.  Findings: The Impella device which has been placed from a femoral approach is unchanged in position since the examination earlier today, with the distal hook in the left ventricle and the device traversing the aortic valve.  Endotracheal tube tip somewhat low, chest approximately 2 cm above the carina, as noted previously. Remaining support apparatus satisfactory with the Swan-Ganz catheter tip overlying the proximal right main pulmonary artery, the right jugular central venous catheter tip overlying the lower SVC, the left subclavian central venous catheter tip projecting over the upper SVC, and the nasogastric tube looped in the stomach. External pacing pads.  Markedly suboptimal inspiration with atelectasis in the lower lobes, right greater than left, unchanged. Pulmonary venous hypertension without overt edema, unchanged.  No new pulmonary parenchymal abnormalities.  IMPRESSION:  1.  Support apparatus satisfactory with the exception that the endotracheal tube tip is low, just about 2 cm above the carina. Specifically, no change in the position of the Impella device since earlier same date. 2.  Suboptimal inspiration with atelectasis in the lung bases, right greater than left, unchanged since earlier in the day. 3.  Pulmonary venous hypertension without overt  edema, also unchanged. 4.  No new pulmonary parenchymal abnormalities.   Original Report Authenticated By: Hulan Saas, M.D.   Dg Chest Port 1 View  12/31/2012   *RADIOLOGY REPORT*  Clinical Data: Endotracheal tube placement  PORTABLE CHEST - 1 VIEW  Comparison: 12/30/2012  Findings: Endotracheal tube tip is 1.1 cm from the carina.  NG tube stable.  Swan-Ganz catheter from the abdomen is stable with its tip in the right hilum.  Right internal jugular dialysis catheter is stable.  Left subclavian central venous catheter is stable.  Low volumes, bilateral pleural effusions, and mild edema are not significantly changed.  No  pneumothorax.  IMPRESSION: Stable pleural effusions and edema.   Original Report Authenticated By: Jolaine Click, M.D.   Dg Chest Port 1 View  12/30/2012   *RADIOLOGY REPORT*  Clinical Data: Catheter placement  PORTABLE CHEST - 1 VIEW  Comparison: Same date.  Findings: No change is noted in position of endotracheal or nasogastric tubes.  Left subclavian catheter is unchanged in position with tip in SVC.  There has been interval placement of right internal jugular dialysis catheter with distal tip in expected position of the SVC.  No pneumothorax is noted. Hypoinflation of the lungs is noted.  Central pulmonary vascular congestion is noted.  IMPRESSION: Status post placement of right internal jugular dialysis catheter with tip in expected position of the SVC.  No pneumothorax is seen.   Original Report Authenticated By: Lupita Raider.,  M.D.   Dg Chest Port 1 View  12/30/2012   *RADIOLOGY REPORT*  Clinical Data: Endotracheal tube placement.  PORTABLE CHEST - 1 VIEW  Comparison: 12/29/2012.  Findings: Low volume chest.  The endotracheal tube tip is 24 mm from the carina.  Defibrillator pads over the chest.  Enteric tube is present with the tip not visualized.  Inferior presumed femoral approach Swan-Ganz catheter is present with the tip in the descending right pulmonary artery.   Progressive atelectasis/volume loss in the right middle and right lower lobes.  Prominent atelectasis associated with lower lung volumes.  Pulmonary vascular congestion is accentuated by the low volumes. Left subclavian central line remains present with the tip in the mid to lower SVC.  IMPRESSION:  1.  Stable support apparatus aside from the addition of what appears to be a femoral approach Swan-Ganz catheter with the tip in the descending right pulmonary artery. 2.  Lower lung volumes with increasing atelectasis, most prominent at the right base.   Original Report Authenticated By: Andreas Newport, M.D.   Dg Chest Portable 1 View  12/29/2012   *RADIOLOGY REPORT*  Clinical Data: Unresponsive  PORTABLE CHEST - 1 VIEW  Comparison: Chest radiograph 12/29/2012  Findings: Endotracheal tube is 3.5 cm from carina.  Left central venous line is unchanged.  Stable cardiac silhouette.  There is central venous congestion similar prior.  There is bibasilar atelectasis greater on the right not changed.  IMPRESSION:  1.  Stable support apparatus. 2.  No interval change.  3.  Low lung volumes, basilar atelectasis, and central venous congestion.   Original Report Authenticated By: Genevive Bi, M.D.   Dg Chest Port 1 View  12/29/2012   *RADIOLOGY REPORT*  Clinical Data: Evaluate endotracheal tube and left subclavian line placement  PORTABLE CHEST - 1 VIEW  Comparison: Earlier same day  Findings:  Grossly unchanged enlarged cardiac silhouette and mediastinal contours, possibly attributable to decreased lung volumes and AP projection.  An endotracheal tube overlies the tracheal air column with tip superior to the carina.  Interval placement of a left subclavian vein approach central venous catheter with tip projecting over the superior cavoatrial junction.  Enteric tube tip and side port project below the left hemidiaphragm.  No pneumothorax.  Pulmonary vasculature is indistinct with cephalization of flow.  Small/trace  bilateral effusions are suspected with minimal amount of fluid layering within the right minor fissure.  Worsening perihilar opacities.  Unchanged bones.  IMPRESSION: 1.  Appropriately positioned support apparatus as above.  No pneumothorax. 2.  Suspected pulmonary edema. 3.  Reduced lung volumes with worsening perihilar opacities, likely atelectasis.  findings suggestive of pulmonary edema   Original  Report Authenticated By: Tacey Ruiz, MD   Dg Chest Portable 1 View  12/29/2012   *RADIOLOGY REPORT*  Clinical Data: Status post intubation  PORTABLE CHEST - 1 VIEW  Comparison: None.  Findings: An endotracheal tube and nasogastric catheter are noted. Nasogastric catheter appears coiled within the distal esophagus. The endotracheal tube is in satisfactory position.  The lungs are well-aerated with some mild right basilar atelectasis.   IMPRESSION: Tubes and lines as described above.  Mild right basilar atelectasis.   Original Report Authenticated By: Alcide Clever, M.D.   Dg Abd Portable 1v  12/30/2012   *RADIOLOGY REPORT*  Clinical Data: NG tube placement  PORTABLE ABDOMEN - 1 VIEW  Comparison: None.  Findings: The NG tube is curled in the fundus of the stomach.  The tip is in the body.  No disproportionate dilatation of bowel. Multiple linear devices project over the abdomen.  IMPRESSION: NG tube is coiled in the stomach   Original Report Authenticated By: Jolaine Click, M.D.    Microbiology: No results found for this or any previous visit (from the past 240 hour(s)).   Labs: Basic Metabolic Panel:  Recent Labs Lab 01/17/13 0830 01/18/13 0602 01/19/13 0435 01/20/13 0640 01/21/13 0545  NA 132* 135 134* 139 137  K 5.2* 5.5* 5.4* 4.9 5.3*  CL 93* 95* 94* 100 99  CO2 22 29 25 28 25   GLUCOSE 164* 84 84 82 80  BUN 90* 58* 74* 40* 53*  CREATININE 13.77* 11.18* 13.52* 9.45* 11.70*  CALCIUM 8.7 8.7 8.6 8.7 8.8  MG  --   --  2.7* 2.4 2.6*  PHOS 7.0* 6.2* 5.3* 4.6 5.2*   Liver Function  Tests:  Recent Labs Lab 01/17/13 0830 01/18/13 0602 01/19/13 0435 01/20/13 0640 01/21/13 0545  AST  --   --  19 19 18   ALT  --   --  23 20 19   ALKPHOS  --   --  95 82 81  BILITOT  --   --  0.3 0.3 0.3  PROT  --   --  6.9 6.2 6.2  ALBUMIN 2.7* 2.7* 2.7* 2.5* 2.6*   No results found for this basename: LIPASE, AMYLASE,  in the last 168 hours No results found for this basename: AMMONIA,  in the last 168 hours CBC:  Recent Labs Lab 01/15/13 0500 01/17/13 0808 01/19/13 0435 01/20/13 0640 01/21/13 0545  WBC 13.0* 12.3* 11.5* 10.5 10.3  NEUTROABS  --   --  7.3 6.1 5.7  HGB 9.8* 9.1* 8.8* 8.7* 8.8*  HCT 30.8* 28.1* 27.8* 28.5* 28.0*  MCV 98.7 95.6 97.2 99.0 98.2  PLT 425* 384 340 327 330   Cardiac Enzymes: No results found for this basename: CKTOTAL, CKMB, CKMBINDEX, TROPONINI,  in the last 168 hours BNP: BNP (last 3 results)  Recent Labs  12/30/12 0500  PROBNP 3296.0*   CBG:  Recent Labs Lab 01/20/13 1635 01/20/13 2155 01/21/13 0809 01/21/13 1209 01/21/13 1740  GLUCAP 99 111* 85 101* 101*       Signed:  WOODS, CURTIS, J  [facility] 01/21/2013, 10:28 PM

## 2013-01-21 NOTE — Procedures (Signed)
I have seen and examined this patient and agree with the plan of care. Patient scheduled for brief dialysis today appears to be slightly improving with a better urine output. Concerned about potassium being above 5  Daphyne Miguez W 01/21/2013, 9:07 AM

## 2013-01-22 LAB — GLUCOSE, CAPILLARY
Glucose-Capillary: 76 mg/dL (ref 70–99)
Glucose-Capillary: 97 mg/dL (ref 70–99)
Glucose-Capillary: 68 mg/dL — ABNORMAL LOW (ref 70–99)
Glucose-Capillary: 118 mg/dL — ABNORMAL HIGH (ref 70–99)
Glucose-Capillary: 102 mg/dL — ABNORMAL HIGH (ref 70–99)

## 2013-01-22 LAB — CBC WITH DIFFERENTIAL/PLATELET
Basophils Absolute: 0.3 10*3/uL — ABNORMAL HIGH (ref 0.0–0.1)
Eosinophils Absolute: 1.5 10*3/uL — ABNORMAL HIGH (ref 0.0–0.7)
HCT: 28.8 % — ABNORMAL LOW (ref 39.0–52.0)
Lymphs Abs: 1.4 10*3/uL (ref 0.7–4.0)
MCH: 31.1 pg (ref 26.0–34.0)
MCHC: 31.6 g/dL (ref 30.0–36.0)
MCV: 98.3 fL (ref 78.0–100.0)
Monocytes Absolute: 1.4 10*3/uL — ABNORMAL HIGH (ref 0.1–1.0)
Neutro Abs: 4.8 10*3/uL (ref 1.7–7.7)
Platelets: 327 10*3/uL (ref 150–400)
RDW: 13.8 % (ref 11.5–15.5)
WBC: 9.4 10*3/uL (ref 4.0–10.5)

## 2013-01-22 LAB — COMPREHENSIVE METABOLIC PANEL
AST: 20 U/L (ref 0–37)
Albumin: 2.4 g/dL — ABNORMAL LOW (ref 3.5–5.2)
BUN: 40 mg/dL — ABNORMAL HIGH (ref 6–23)
Calcium: 8.4 mg/dL (ref 8.4–10.5)
Creatinine, Ser: 8.62 mg/dL — ABNORMAL HIGH (ref 0.50–1.35)
Total Bilirubin: 0.3 mg/dL (ref 0.3–1.2)

## 2013-01-22 LAB — MAGNESIUM: Magnesium: 2.4 mg/dL (ref 1.5–2.5)

## 2013-01-22 LAB — PHOSPHORUS: Phosphorus: 4.9 mg/dL — ABNORMAL HIGH (ref 2.3–4.6)

## 2013-01-22 NOTE — Progress Notes (Signed)
[facility] ROUNDING NOTE   Subjective:   Interval History: patient comfortable no complaints  Objective:  Vital signs in last 24 hours:  Temp:  [98.5 F (36.9 C)-98.9 F (37.2 C)] 98.9 F (37.2 C) (08/17 0508) Pulse Rate:  [75-84] 75 (08/17 0508) Resp:  [16-20] 16 (08/17 0508) BP: (109-110)/(62-69) 110/69 mmHg (08/17 0508) SpO2:  [99 %-100 %] 100 % (08/17 0508)  Weight change: 0.873 kg (1 lb 14.8 oz) Filed Weights   01/19/13 1240 01/20/13 2156 01/21/13 0900  Weight: 109.8 kg (242 lb 1 oz) 113.127 kg (249 lb 6.4 oz) 114 kg (251 lb 5.2 oz)    Intake/Output: I/O last 3 completed shifts: In: -  Out: 1125 [Urine:1125]   Intake/Output this shift:  Total I/O In: -  Out: 750 [Urine:750]  CVS- RRR RS- CTA ABD- BS present soft non-distended EXT- no edema   Basic Metabolic Panel:  Recent Labs Lab 01/18/13 0602 01/19/13 0435 01/20/13 0640 01/21/13 0545 01/22/13 0420  NA 135 134* 139 137 140  K 5.5* 5.4* 4.9 5.3* 4.6  CL 95* 94* 100 99 100  CO2 29 25 28 25 28   GLUCOSE 84 84 82 80 81  BUN 58* 74* 40* 53* 40*  CREATININE 11.18* 13.52* 9.45* 11.70* 8.62*  CALCIUM 8.7 8.6 8.7 8.8 8.4  MG  --  2.7* 2.4 2.6* 2.4  PHOS 6.2* 5.3* 4.6 5.2* 4.9*    Liver Function Tests:  Recent Labs Lab 01/18/13 0602 01/19/13 0435 01/20/13 0640 01/21/13 0545 01/22/13 0420  AST  --  19 19 18 20   ALT  --  23 20 19 19   ALKPHOS  --  95 82 81 83  BILITOT  --  0.3 0.3 0.3 0.3  PROT  --  6.9 6.2 6.2 6.7  ALBUMIN 2.7* 2.7* 2.5* 2.6* 2.4*   No results found for this basename: LIPASE, AMYLASE,  in the last 168 hours No results found for this basename: AMMONIA,  in the last 168 hours  CBC:  Recent Labs Lab 01/17/13 0808 01/19/13 0435 01/20/13 0640 01/21/13 0545 01/22/13 0420  WBC 12.3* 11.5* 10.5 10.3 9.4  NEUTROABS  --  7.3 6.1 5.7 4.8  HGB 9.1* 8.8* 8.7* 8.8* 9.1*  HCT 28.1* 27.8* 28.5* 28.0* 28.8*  MCV 95.6 97.2 99.0 98.2 98.3  PLT 384 340 327 330 327     Cardiac Enzymes: No results found for this basename: CKTOTAL, CKMB, CKMBINDEX, TROPONINI,  in the last 168 hours  BNP: No components found with this basename: POCBNP,   CBG:  Recent Labs Lab 01/21/13 1209 01/21/13 1740 01/21/13 2146 01/22/13 0811 01/22/13 1144  GLUCAP 101* 101* 121* 76 97    Microbiology: Results for orders placed during the hospital encounter of 12/29/12  URINE CULTURE     Status: None   Collection Time    12/29/12  3:08 PM      Result Value Range Status   Specimen Description URINE, CATHETERIZED   Final   Special Requests NONE   Final   Culture  Setup Time 12/29/2012 23:00   Final   Colony Count NO GROWTH   Final   Culture NO GROWTH   Final   Report Status 12/30/2012 FINAL   Final  CULTURE, BLOOD (ROUTINE X 2)     Status: None   Collection Time    12/29/12  3:27 PM      Result Value Range Status   Specimen Description BLOOD HAND LEFT   Final   Special Requests  BOTTLES DRAWN AEROBIC ONLY 5CC   Final   Culture  Setup Time 12/29/2012 22:48   Final   Culture NO GROWTH 5 DAYS   Final   Report Status 01/04/2013 FINAL   Final  CULTURE, BLOOD (ROUTINE X 2)     Status: None   Collection Time    12/29/12  3:37 PM      Result Value Range Status   Specimen Description BLOOD ARM LEFT   Final   Special Requests BOTTLES DRAWN AEROBIC ONLY 2CC   Final   Culture  Setup Time 12/29/2012 22:48   Final   Culture NO GROWTH 5 DAYS   Final   Report Status 01/04/2013 FINAL   Final  URINE CULTURE     Status: None   Collection Time    12/29/12  7:11 PM      Result Value Range Status   Specimen Description URINE, CATHETERIZED   Final   Special Requests NONE   Final   Culture  Setup Time 12/29/2012 21:09   Final   Colony Count NO GROWTH   Final   Culture NO GROWTH   Final   Report Status 12/31/2012 FINAL   Final  MRSA PCR SCREENING     Status: None   Collection Time    12/30/12  2:24 AM      Result Value Range Status   MRSA by PCR NEGATIVE  NEGATIVE Final    Comment:            The GeneXpert MRSA Assay (FDA     approved for NASAL specimens     only), is one component of a     comprehensive MRSA colonization     surveillance program. It is not     intended to diagnose MRSA     infection nor to guide or     monitor treatment for     MRSA infections.  CULTURE, RESPIRATORY (NON-EXPECTORATED)     Status: None   Collection Time    12/30/12 11:51 AM      Result Value Range Status   Specimen Description TRACHEAL ASPIRATE   Final   Special Requests NONE   Final   Gram Stain     Final   Value: RARE WBC PRESENT, PREDOMINANTLY PMN     NO SQUAMOUS EPITHELIAL CELLS SEEN     NO ORGANISMS SEEN   Culture Non-Pathogenic Oropharyngeal-type Flora Isolated.   Final   Report Status 01/02/2013 FINAL   Final  CLOSTRIDIUM DIFFICILE BY PCR     Status: None   Collection Time    01/03/13  4:35 PM      Result Value Range Status   C difficile by pcr NEGATIVE  NEGATIVE Final  CULTURE, BAL-QUANTITATIVE     Status: None   Collection Time    01/04/13  3:35 PM      Result Value Range Status   Specimen Description BRONCHIAL ALVEOLAR LAVAGE   Final   Special Requests BRONCH WASH LL LOBE    Final   Gram Stain     Final   Value: RARE WBC PRESENT, PREDOMINANTLY PMN     NO SQUAMOUS EPITHELIAL CELLS SEEN     NO ORGANISMS SEEN   Colony Count 30,000 COLONIES/ML   Final   Culture     Final   Value: STAPHYLOCOCCUS AUREUS     Note: RIFAMPIN AND GENTAMICIN SHOULD NOT BE USED AS SINGLE DRUGS FOR TREATMENT OF STAPH INFECTIONS.   Report Status 01/07/2013 FINAL  Final   Organism ID, Bacteria STAPHYLOCOCCUS AUREUS   Final    Coagulation Studies: No results found for this basename: LABPROT, INR,  in the last 72 hours  Urinalysis: No results found for this basename: COLORURINE, APPERANCEUR, LABSPEC, PHURINE, GLUCOSEU, HGBUR, BILIRUBINUR, KETONESUR, PROTEINUR, UROBILINOGEN, NITRITE, LEUKOCYTESUR,  in the last 72 hours    Imaging: No results found.   Medications:   .  sodium chloride Stopped (01/01/13 1400)  . sodium chloride Stopped (12/31/12 2300)   . antiseptic oral rinse  15 mL Mouth Rinse TID WC & HS  . aspirin  325 mg Oral Daily  . calcium acetate  1,334 mg Oral TID WC  . carvedilol  6.25 mg Per NG tube BID WC  . darbepoetin (ARANESP) injection - NON-DIALYSIS  150 mcg Subcutaneous Q Mon-1800  . heparin subcutaneous  5,000 Units Subcutaneous Q8H  . insulin aspart  0-9 Units Subcutaneous TID AC & HS  . ketoconazole   Topical BID  . lanthanum  1,000 mg Oral TID WC  . multivitamin with minerals  1 tablet Oral Daily  . pantoprazole  40 mg Oral Daily  . simvastatin  20 mg Oral q1800   sodium chloride, feeding supplement (NEPRO CARB STEADY), fentaNYL, heparin, heparin, hydrALAZINE, hydrOXYzine, lidocaine (PF), lidocaine-prilocaine, ondansetron (ZOFRAN) IV, oxyCODONE, pentafluoroprop-tetrafluoroeth, traMADol  Assessment/ Plan:   AKI 2/2 ATN [unclear baseline] / cardiogenic shock, arrest, and contrast nephropathy: Started HD 7/25. Urine output reported to be improving.   1. AKI .  Dialysis dependent 7/25 although has improved form anuria to steady urine output. Last dialysis was Saturday 01/21/13 for hyperkalemia  2. Anemia; on aranesp11mcg given 8/11 last Hb 9.1    3. Electrolytes stable 4. Acid/base controlled     The patient appears to be improving in terms of urine output but has remained dialysis dependent for 3 weeks. [facility] has been consulted to place fistula although we are still anticipating recovery. We shall follow his labs this week encouraged that the urine output has improved.    LOS: 24 Nicholas Potts W @TODAY @1 :22 PM

## 2013-01-22 NOTE — Progress Notes (Signed)
Hypoglycemic Event  CBG: 68  Treatment: meal and juice  Symptoms: none  Follow-up CBG: Time:1830 CBG Result:118  Possible Reasons for Event: unknown  Comments/MD notified:Dr. Joseph Art / no new orders    Nicholas Potts  Remember to initiate Hypoglycemia Order Set & complete

## 2013-01-22 NOTE — Progress Notes (Signed)
[facility] Progress Nicholas Potts ZOX:[id number] DOB: [birth year] DOA: 12/29/2012  PCP: No primary provider on file.  Admit date: 12/29/2012 Discharge date: 01/22/2013  Time spent:40 minutes  Recommendations for Outpatient Follow-up:  1. Cardiogenic shock/acute CHF NYHA class III; resolved, echocardiogram from 01/11/2013 shows grade 1 diastolic dysfunction.   2. HTN; within AHA guidelines no change the meds require  3. Acute renal failure/ATN; now on hemodialysis T/TH/Sat; per   Dr Hyman Hopes (Nephrologist) I would prepare patient for ESRD management. No return of renal function anticipated soon. Will need placement of AVF possibly Monday with discharge plans to [facility] TTS. Contacted Dr. Hyman Hopes (Nephrologist) and discussed patient's case. Per Dr. Hyman Hopes patient's renal function is improving, and request that patient be kept for another week in order to determine final status of patients renal function. Also allow dialysis PRN for Electrolyte imbalances   4. Accidental opiate poisoning; stable  5. Tinea Pedis; continue Ketoconazole and Pumice stone for Lt Foot, Hydroxyazine for pruritis.   6. OSA; patient doing well on hospital supplied CPAP, will require outpatient sleep study in order to reestablish his baseline.   Discharge Diagnoses:  Principal Problem:   Acute respiratory failure Active Problems:   Cardiogenic shock   Acute renal failure (ARF)   Cardiac and respiratory arrest with VT due to:   Aspiration pneumonia   Accidental opiate poisoning   ARF (acute renal failure) with tubular necrosis requiring HD   Acute systolic congestive heart failure, NYHA class 3   Anemia   Thrombocytopenia, unspecified   HTN (hypertension)   Tinea pedis   OSA on CPAP   Discharge Condition:   Diet recommendation:   Filed Weights   01/19/13 1240 01/20/13 2156 01/21/13 0900  Weight: 109.8 kg (242 lb 1 oz) 113.127 kg (249 lb 6.4 oz) 114 kg (251 lb 5.2 oz)     History of present illness:  58- yo HM PMHx Rt Achilles tendon reconstruction was brought in for apnea due to Unintentional narcotic overdose. Patient was found to have respiratory acidosis and hypothermia requiring intubation in the ER. Subsequent course complicated by hypotension, profound bradycardia and became pulseless requiring CPR. Patient then went into V. tach requiring defibrillation and amiodarone. He was stabilized and transferred to ICU. Emergent echo done showed severe wall motion abnormality. He subsequently coded 2 more times and taken to cath lab immediately. Patient found to have severe cardiogenic shock and severely in the dysfunction with EF of 20%. Patient was placed on the impella dynamic support system along with pressors . Patient also developed oliguric renal failure related to shock for which a hemodialysis catheter was placed and CVVHD initiated. Patient had severe encephalopathy secondary to acute insult. Neurology was consulted and had an EEG done which showed moderate to severe generalized nonspecific slowing. Patient's mental status slowly started to improve and after successful extubation he was transferred to step down. Cardiomyopathy has resolved. ARF not resolving and still oliguric and nephrology continues to follow.TODAY now has a boot and is ambulating with a walker and the aid of his son.    Hospital Course:    Procedures:  Echocardiogram 01/11/2013 - Left ventricle: The cavity size was normal. Wall thickness was normal. Systolic function was normal. LVEF= 55% to 60%.  Doppler parameters are consistent with abnormal left ventricular relaxation (grade 1 diastolic dysfunction). - Aortic valve: There was no stenosis. - Mitral valve: No significant regurgitation. - Left atrium: The atrium was mildly dilated. - Right  ventricle: The cavity size was normal. Systolic function was normal.   Consultations:  Nephrology, orthopedics, vascular surgery,  cardiology, critical care  Discharge Exam: Filed Vitals:   01/21/13 1139 01/21/13 1602 01/21/13 2143 01/22/13 0508  BP: 105/61 109/64 109/62 110/69  Pulse: 82 84 77 75  Temp: 98.5 F (36.9 C) 98.7 F (37.1 C) 98.5 F (36.9 C) 98.9 F (37.2 C)  TempSrc: Oral Oral Oral Oral  Resp: 16 17 20 16   Height:      Weight:      SpO2: 99% 100% 99% 100%    General: Alert, NAD Cardiovascular: Regular rhythm and rate, negative murmurs rubs or gallops, DP/TP pulses 2+ bilateral Respiratory: Clear to auscultation bilateral Muscle skeletal; left plantar surface of foot showed significant tinea pedis, right Achilles tendon repair no sign of infection Steri-Strips still in place, right chest wall Vas-Cath no sign of infection  Discharge Instructions     Medication List    ASK your doctor about these medications       aspirin 325 MG tablet  Take 325 mg by mouth daily.     meloxicam 15 MG tablet  Commonly known as:  MOBIC  Take 15 mg by mouth daily.     ONE-A-DAY 50 PLUS Tabs  Take 1 tablet by mouth daily.     oxycodone 5 MG capsule  Commonly known as:  OXY-IR  Take 10 mg by mouth every 4 (four) hours as needed for pain.     simvastatin 20 MG tablet  Commonly known as:  ZOCOR  Take 20 mg by mouth daily.     traMADol 50 MG tablet  Commonly known as:  ULTRAM  Take 50 mg by mouth daily.       No Known Allergies    The results of significant diagnostics from this hospitalization (including imaging, microbiology, ancillary and laboratory) are listed below for reference.    Significant Diagnostic Studies: Ct Head Wo Contrast  12/29/2012   *RADIOLOGY REPORT*  Clinical Data: Altered mental status.  Possible overdose.  CT HEAD WITHOUT CONTRAST  Technique:  Contiguous axial images were obtained from the base of the skull through the vertex without contrast.  Comparison: Head CT 12/10/2007.  Findings: There is no evidence of acute intracranial hemorrhage, mass lesion, brain edema or  extra-axial fluid collection.  The ventricles and subarachnoid spaces are appropriately sized for age. There is no CT evidence of acute cortical infarction.  There is diffuse mucosal thickening throughout the nasal passages, ethmoid and maxillary sinuses.  The nasopharyngeal airway is closed at the time of imaging (the patient is intubated).  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  The mastoids and middle ears are clear. The calvarium is intact.  IMPRESSION:  1.  No acute intracranial or calvarial findings identified. 2.  Diffuse mucosal thickening throughout the visualized nasal passages and paranasal sinuses. Significant maxillary periodontal disease bilaterally.   Original Report Authenticated By: Carey Bullocks, M.D.   Dg Chest Port 1 View  01/11/2013   *RADIOLOGY REPORT*  Clinical Data: Shortness of breath.  Pneumonia.  PORTABLE CHEST - 1 VIEW  Comparison: Plain film chest 01/08/2013 and 01/09/2013.  Findings: Dialysis catheter remains in place.  Lung volumes are low.  Mild left basilar atelectasis is identified.  There is no pulmonary edema.  No pneumothorax or pleural fluid is identified. Heart size is enlarged.  IMPRESSION:  Mild subsegmental atelectasis left lung base.  No acute abnormality.   Original Report Authenticated By: Holley Dexter, M.D.  Dg Chest Port 1 View  01/09/2013   *RADIOLOGY REPORT*  Clinical Data: Central catheter placement  PORTABLE CHEST - 1 VIEW  Comparison: Study obtained earlier in the day  Findings: Dual lumen catheter tip is at the cavoatrial junction. Left-sided catheter tip is in the right atrium.  No pneumothorax.  There is mild atelectasis in the left base.  Elsewhere, lungs are clear.  Heart is upper normal in size with normal pulmonary vascularity.  No adenopathy.  IMPRESSION: Catheters as described without pneumothorax.  Mild left base atelectasis.   Original Report Authenticated By: Bretta Bang, M.D.   Dg Chest Port 1 View  01/09/2013    *RADIOLOGY REPORT*  Clinical Data: Follow up pneumonia  PORTABLE CHEST - 1 VIEW  Comparison: Prior radiograph from 01/08/2013  Findings: The the patient has been extubated in the interim.  Right IJ Cordis catheter is stable in position.  Enteric tube is been removed.  Cardiac silhouette is unchanged.  The lungs remain hypoinflated.  There is been interval improvement in aeration of both lung bases with improved bibasilar opacities. Veiling opacity overlying the costophrenic angles are consistent with small pleural effusions, slightly larger on the left.  Osseous structures are unchanged.  IMPRESSION: 1.  Interval improvement in aeration of both lung bases with improved bibasilar opacities status post extubation. Particularly, dense opacity at the left lung base is improved, which may represent improved atelectasis, pleural fluid,  and / or infiltrate. 2.  Small bilateral pleural effusions, left greater than right.   Original Report Authenticated By: Rise Mu, M.D.   Dg Chest Port 1 View  01/08/2013   *RADIOLOGY REPORT*  Clinical Data: Follow up pneumonia  PORTABLE CHEST - 1 VIEW  Comparison: Prior chest x-ray 01/07/2013  Findings: The endotracheal tube is 3.5 cm above the carina.  Left subclavian approach central venous catheter with the tip at the superior cavoatrial junction.  The right IJ approach temporary hemodialysis catheter is noted with the tip in the upper SVC. Visualized nasogastric tube in unchanged position.  Cardiac and mediastinal contours are unchanged.  Inspiratory volumes remain very low and there is left basilar atelectasis versus infiltrate. Small right pleural effusion is improving and there is improved aeration in the right base.  IMPRESSION:  1.  Stable and satisfactory support apparatus. 2.  Similar appearance of dense left basilar opacity which may represent a combination of pleural fluid with atelectasis and / or infiltrate. 3.  Improved aeration in the right base with  decreasing pleural fluid and opacity.   Original Report Authenticated By: Malachy Moan, M.D.   Dg Chest Port 1 View  01/07/2013   *RADIOLOGY REPORT*  Clinical Data: Assess infiltrate  PORTABLE CHEST - 1 VIEW  Comparison: Prior radiograph from 01/06/2013  Findings: The the patient remains intubated with the tip of the endotracheal tube located 2.9 cm above the carina.  Enteric tube courses into the abdomen, and appears coiled within the gastric fundus, similar to prior. Right IJ central venous catheter is unchanged.  Left subclavian central venous catheter is in stable position with tip at cavoatrial junction.  Heart size is unchanged.  The lungs remain hypoinflated.  Bilateral layering pleural effusions persist, likely capping the right lung apex.  Associated bibasilar airspace opacities are also not significantly changed, which may read represent atelectasis or infiltrate.  No overt pulmonary edema.  No pneumothorax.  The bony structures are intact.  IMPRESSION: 1.  Support apparatus as above.  Tip of endotracheal tube 2.9 cm above the  carina. 2.  Similar appearance of the chest with persistent hypoinflation with bilateral pleural effusions and associated bibasilar airspace opacities, which may reflect atelectasis or infiltrate.   Original Report Authenticated By: Rise Mu, M.D.   Dg Chest Port 1 View  01/06/2013   *RADIOLOGY REPORT*  Clinical Data: Pleural effusions.  PORTABLE CHEST - 1 VIEW  Comparison: 01/05/2013.  Findings: Endotracheal tube ends 2 cm above the carina.  Gastric suction tube is coiled in the stomach.  The right IJ sheath has been retracted slightly, still in good position at the level of the upper SVC. Left subclavian central venous catheter, tip at the level of the superior cavoatrial junction.  Unchanged heart size and mediastinal contours, distorted by low lung volumes.  Layering bilateral pleural effusions, likely capping the apical lungs.  There is likely also a lung  opacity at the left base. No evident pneumothorax.  IMPRESSION:  1.  Satisfactory positioning of tubes and lines.  Please note that the right IJ catheter has been retracted since yesterday. 2. Low volume lungs with bilateral pleural effusions and left base atelectasis or infiltrate.   Original Report Authenticated By: Tiburcio Pea   Dg Chest Port 1 View  01/05/2013   *RADIOLOGY REPORT*  Clinical Data: Atelectasis.  PORTABLE CHEST - 1 VIEW  Comparison: 01/04/2013.  Findings: The support apparatus is unchanged.  Endotracheal tube tip is 29 mm from the carina.  Pulmonary aeration is improved compared yesterday's exam with decreasing basilar atelectasis. Positional shift in the pleural effusions.  Pulmonary vascular congestion persists.  Cardiopericardial silhouette grossly appears unchanged.  IMPRESSION:  1.  Stable support apparatus. 2.  Improving pulmonary aeration with positional shift in the pleural effusions.  Overall low volume chest.   Original Report Authenticated By: Andreas Newport, M.D.   Dg Chest Port 1 View  01/04/2013   *RADIOLOGY REPORT*  Clinical Data: Evaluate endotracheal tube position.  Infiltrate and atelectasis.  PORTABLE CHEST - 1 VIEW  Comparison: 01/03/2013.  Findings: Endotracheal tube tip is 28 mm from the carina.  Right IJ vas cath.  Enteric tube is present with the tip not visualized. There is probably a loop of the enteric tube in the proximal fundus of the stomach.  Markedly low lung volumes remain present with basilar atelectasis and diffuse bilateral basilar predominant airspace disease most compatible with pulmonary edema.  Bilateral pleural effusions are also present.  Left subclavian central line appears similar with the tip in the mid-to-lower SVC.  IMPRESSION:  1.  Endotracheal tube tip 28 mm from the carina. 2.  Other support apparatus unchanged. 3.  Unchanged pulmonary aeration with bilateral airspace disease/edema, pleural effusions and atelectasis.   Original Report  Authenticated By: Andreas Newport, M.D.   Dg Chest Port 1 View  01/03/2013   *RADIOLOGY REPORT*  Clinical Data: Endotracheal tube placement  PORTABLE CHEST - 1 VIEW  Comparison: Prior chest x-ray 01/02/2013  Findings: The tip of the endotracheal tube is 1.5 cm above the carina.  The patient is in a kyphotic position.  The right IJ temporary hemodialysis catheter tip projects over the distal SVC. The tip of the left subclavian approach central venous catheter projects over the superior cavoatrial junction. The nasogastric tube can be seen coiled within the gastric fundus.  Inspiratory volumes are very low.  There is mild pulmonary edema and diffuse bilateral interstitial and airspace opacities.  Probable bilateral layering effusions.  Stable cardiomegaly.  IMPRESSION:  1.  The tip of the endotracheal tube is 1.5 cm above  the carina. Relatively distal placement of the endotracheal tube likely exaggerated by the patient is kyphotic positioning. 2.  Other support apparatus in stable and satisfactory position. 3.  Similar appearance of the lungs with very low inspiratory volumes, edema, bilateral layering effusions and infiltrate versus atelectasis.   Original Report Authenticated By: Malachy Moan, M.D.   Dg Chest Port 1 View  01/02/2013   *RADIOLOGY REPORT*  Clinical Data: Endotracheal tube position  PORTABLE CHEST - 1 VIEW  Comparison: 01/01/2013  Findings: Endotracheal tube is appropriately positioned.  Insert NG right IJ central line tip terminates over the distal SVC.  Mild cardiomegaly process of central vascular congestion and small pleural effusions obscuring lung bases.  IMPRESSION: Support apparatus as above.  Central vascular congestion with small pleural effusions.   Original Report Authenticated By: Christiana Pellant, M.D.   Dg Chest Port 1 View  01/01/2013   *RADIOLOGY REPORT*  Clinical Data: Hypoxia  PORTABLE CHEST - 1 VIEW  Comparison: December 31, 2012  Findings:  Endotracheal tube tip is 2.5 cm  above the carina. Central catheters are unchanged in position. Nasogastric tube tip and side port are in the stomach region.  No apparent pneumothorax.  There is consolidation in the left lower lobe with small left effusion.  The right lung is clear.  Heart is borderline prominent with normal pulmonary vascularity.  No appreciable adenopathy.  IMPRESSION:  Tube and catheter positions as described.  No pneumothorax. There is consolidation in the left lower lobe with small left effusion.  Right lung clear.   Original Report Authenticated By: Bretta Bang, M.D.   Dg Chest Port 1 View  12/31/2012   *RADIOLOGY REPORT*  Clinical Data: Critically ill patient with possible change in position of one of the indwelling support apparatus, the Impella device.  PORTABLE CHEST - 1 VIEW 12/31/2012 1141 hours:  Comparison: Portable chest x-ray earlier same date 0608 hours.  Findings: The Impella device which has been placed from a femoral approach is unchanged in position since the examination earlier today, with the distal hook in the left ventricle and the device traversing the aortic valve.  Endotracheal tube tip somewhat low, chest approximately 2 cm above the carina, as noted previously. Remaining support apparatus satisfactory with the Swan-Ganz catheter tip overlying the proximal right main pulmonary artery, the right jugular central venous catheter tip overlying the lower SVC, the left subclavian central venous catheter tip projecting over the upper SVC, and the nasogastric tube looped in the stomach. External pacing pads.  Markedly suboptimal inspiration with atelectasis in the lower lobes, right greater than left, unchanged. Pulmonary venous hypertension without overt edema, unchanged.  No new pulmonary parenchymal abnormalities.  IMPRESSION:  1.  Support apparatus satisfactory with the exception that the endotracheal tube tip is low, just about 2 cm above the carina. Specifically, no change in the position of the  Impella device since earlier same date. 2.  Suboptimal inspiration with atelectasis in the lung bases, right greater than left, unchanged since earlier in the day. 3.  Pulmonary venous hypertension without overt edema, also unchanged. 4.  No new pulmonary parenchymal abnormalities.   Original Report Authenticated By: Hulan Saas, M.D.   Dg Chest Port 1 View  12/31/2012   *RADIOLOGY REPORT*  Clinical Data: Endotracheal tube placement  PORTABLE CHEST - 1 VIEW  Comparison: 12/30/2012  Findings: Endotracheal tube tip is 1.1 cm from the carina.  NG tube stable.  Swan-Ganz catheter from the abdomen is stable with its tip in the right  hilum.  Right internal jugular dialysis catheter is stable.  Left subclavian central venous catheter is stable.  Low volumes, bilateral pleural effusions, and mild edema are not significantly changed.  No pneumothorax.  IMPRESSION: Stable pleural effusions and edema.   Original Report Authenticated By: Jolaine Click, M.D.   Dg Chest Port 1 View  12/30/2012   *RADIOLOGY REPORT*  Clinical Data: Catheter placement  PORTABLE CHEST - 1 VIEW  Comparison: Same date.  Findings: No change is noted in position of endotracheal or nasogastric tubes.  Left subclavian catheter is unchanged in position with tip in SVC.  There has been interval placement of right internal jugular dialysis catheter with distal tip in expected position of the SVC.  No pneumothorax is noted. Hypoinflation of the lungs is noted.  Central pulmonary vascular congestion is noted.  IMPRESSION: Status post placement of right internal jugular dialysis catheter with tip in expected position of the SVC.  No pneumothorax is seen.   Original Report Authenticated By: Lupita Raider.,  M.D.   Dg Chest Port 1 View  12/30/2012   *RADIOLOGY REPORT*  Clinical Data: Endotracheal tube placement.  PORTABLE CHEST - 1 VIEW  Comparison: 12/29/2012.  Findings: Low volume chest.  The endotracheal tube tip is 24 mm from the carina.   Defibrillator pads over the chest.  Enteric tube is present with the tip not visualized.  Inferior presumed femoral approach Swan-Ganz catheter is present with the tip in the descending right pulmonary artery.  Progressive atelectasis/volume loss in the right middle and right lower lobes.  Prominent atelectasis associated with lower lung volumes.  Pulmonary vascular congestion is accentuated by the low volumes. Left subclavian central line remains present with the tip in the mid to lower SVC.  IMPRESSION:  1.  Stable support apparatus aside from the addition of what appears to be a femoral approach Swan-Ganz catheter with the tip in the descending right pulmonary artery. 2.  Lower lung volumes with increasing atelectasis, most prominent at the right base.   Original Report Authenticated By: Andreas Newport, M.D.   Dg Chest Portable 1 View  12/29/2012   *RADIOLOGY REPORT*  Clinical Data: Unresponsive  PORTABLE CHEST - 1 VIEW  Comparison: Chest radiograph 12/29/2012  Findings: Endotracheal tube is 3.5 cm from carina.  Left central venous line is unchanged.  Stable cardiac silhouette.  There is central venous congestion similar prior.  There is bibasilar atelectasis greater on the right not changed.  IMPRESSION:  1.  Stable support apparatus. 2.  No interval change.  3.  Low lung volumes, basilar atelectasis, and central venous congestion.   Original Report Authenticated By: Genevive Bi, M.D.   Dg Chest Port 1 View  12/29/2012   *RADIOLOGY REPORT*  Clinical Data: Evaluate endotracheal tube and left subclavian line placement  PORTABLE CHEST - 1 VIEW  Comparison: Earlier same day  Findings:  Grossly unchanged enlarged cardiac silhouette and mediastinal contours, possibly attributable to decreased lung volumes and AP projection.  An endotracheal tube overlies the tracheal air column with tip superior to the carina.  Interval placement of a left subclavian vein approach central venous catheter with tip projecting  over the superior cavoatrial junction.  Enteric tube tip and side port project below the left hemidiaphragm.  No pneumothorax.  Pulmonary vasculature is indistinct with cephalization of flow.  Small/trace bilateral effusions are suspected with minimal amount of fluid layering within the right minor fissure.  Worsening perihilar opacities.  Unchanged bones.  IMPRESSION: 1.  Appropriately positioned  support apparatus as above.  No pneumothorax. 2.  Suspected pulmonary edema. 3.  Reduced lung volumes with worsening perihilar opacities, likely atelectasis.  findings suggestive of pulmonary edema   Original Report Authenticated By: Tacey Ruiz, MD   Dg Chest Portable 1 View  12/29/2012   *RADIOLOGY REPORT*  Clinical Data: Status post intubation  PORTABLE CHEST - 1 VIEW  Comparison: None.  Findings: An endotracheal tube and nasogastric catheter are noted. Nasogastric catheter appears coiled within the distal esophagus. The endotracheal tube is in satisfactory position.  The lungs are well-aerated with some mild right basilar atelectasis.   IMPRESSION: Tubes and lines as described above.  Mild right basilar atelectasis.   Original Report Authenticated By: Alcide Clever, M.D.   Dg Abd Portable 1v  12/30/2012   *RADIOLOGY REPORT*  Clinical Data: NG tube placement  PORTABLE ABDOMEN - 1 VIEW  Comparison: None.  Findings: The NG tube is curled in the fundus of the stomach.  The tip is in the body.  No disproportionate dilatation of bowel. Multiple linear devices project over the abdomen.  IMPRESSION: NG tube is coiled in the stomach   Original Report Authenticated By: Jolaine Click, M.D.    Microbiology: No results found for this or any previous visit (from the past 240 hour(s)).   Labs: Basic Metabolic Panel:  Recent Labs Lab 01/18/13 0602 01/19/13 0435 01/20/13 0640 01/21/13 0545 01/22/13 0420  NA 135 134* 139 137 140  K 5.5* 5.4* 4.9 5.3* 4.6  CL 95* 94* 100 99 100  CO2 29 25 28 25 28   GLUCOSE 84 84 82  80 81  BUN 58* 74* 40* 53* 40*  CREATININE 11.18* 13.52* 9.45* 11.70* 8.62*  CALCIUM 8.7 8.6 8.7 8.8 8.4  MG  --  2.7* 2.4 2.6* 2.4  PHOS 6.2* 5.3* 4.6 5.2* 4.9*   Liver Function Tests:  Recent Labs Lab 01/18/13 0602 01/19/13 0435 01/20/13 0640 01/21/13 0545 01/22/13 0420  AST  --  19 19 18 20   ALT  --  23 20 19 19   ALKPHOS  --  95 82 81 83  BILITOT  --  0.3 0.3 0.3 0.3  PROT  --  6.9 6.2 6.2 6.7  ALBUMIN 2.7* 2.7* 2.5* 2.6* 2.4*   No results found for this basename: LIPASE, AMYLASE,  in the last 168 hours No results found for this basename: AMMONIA,  in the last 168 hours CBC:  Recent Labs Lab 01/17/13 0808 01/19/13 0435 01/20/13 0640 01/21/13 0545 01/22/13 0420  WBC 12.3* 11.5* 10.5 10.3 9.4  NEUTROABS  --  7.3 6.1 5.7 4.8  HGB 9.1* 8.8* 8.7* 8.8* 9.1*  HCT 28.1* 27.8* 28.5* 28.0* 28.8*  MCV 95.6 97.2 99.0 98.2 98.3  PLT 384 [phone] 327   Cardiac Enzymes: No results found for this basename: CKTOTAL, CKMB, CKMBINDEX, TROPONINI,  in the last 168 hours BNP: BNP (last 3 results)  Recent Labs  12/30/12 0500  PROBNP 3296.0*   CBG:  Recent Labs Lab 01/21/13 1209 01/21/13 1740 01/21/13 2146 01/22/13 0811 01/22/13 1144  GLUCAP 101* 101* 121* 76 97       Signed:  Bow Buntyn, J  [facility] 01/22/2013, 3:51 PM

## 2013-01-23 ENCOUNTER — Encounter (HOSPITAL_COMMUNITY)
Admission: EM | Disposition: A | Discharge status: Skilled Nursing Facility | Payer: Self-pay | Source: Home / Self Care | Attending: Pulmonary Disease

## 2013-01-23 LAB — MAGNESIUM: Magnesium: 2.5 mg/dL (ref 1.5–2.5)

## 2013-01-23 LAB — COMPREHENSIVE METABOLIC PANEL
Albumin: 2.7 g/dL — ABNORMAL LOW (ref 3.5–5.2)
BUN: 54 mg/dL — ABNORMAL HIGH (ref 6–23)
Calcium: 9.1 mg/dL (ref 8.4–10.5)
GFR calc Af Amer: 5 mL/min — ABNORMAL LOW (ref >90)
Glucose, Bld: 86 mg/dL (ref 70–99)
Total Protein: 6.9 g/dL (ref 6.0–8.3)

## 2013-01-23 LAB — CBC WITH DIFFERENTIAL/PLATELET
Basophils Absolute: 0.3 10*3/uL — ABNORMAL HIGH (ref 0.0–0.1)
Basophils Relative: 3 % — ABNORMAL HIGH (ref 0–1)
Eosinophils Absolute: 1.1 10*3/uL — ABNORMAL HIGH (ref 0.0–0.7)
HCT: 28.7 % — ABNORMAL LOW (ref 39.0–52.0)
Hemoglobin: 9 g/dL — ABNORMAL LOW (ref 13.0–17.0)
Lymphocytes Relative: 20 % (ref 12–46)
MCH: 30.8 pg (ref 26.0–34.0)
MCHC: 31.4 g/dL (ref 30.0–36.0)
Monocytes Absolute: 1.1 10*3/uL — ABNORMAL HIGH (ref 0.1–1.0)
Neutro Abs: 4.7 10*3/uL (ref 1.7–7.7)
Neutrophils Relative %: 53 % (ref 43–77)
RDW: 13.8 % (ref 11.5–15.5)

## 2013-01-23 LAB — GLUCOSE, CAPILLARY
Glucose-Capillary: 95 mg/dL (ref 70–99)
Glucose-Capillary: 104 mg/dL — ABNORMAL HIGH (ref 70–99)
Glucose-Capillary: 84 mg/dL (ref 70–99)

## 2013-01-23 SURGERY — ARTERIOVENOUS (AV) FISTULA CREATION
Anesthesia: Monitor Anesthesia Care | Site: Arm Lower | Laterality: Left

## 2013-01-23 NOTE — Progress Notes (Signed)
Patient ID: PRENTIS LANGDON, male   DOB: 1960/06/10, 52 y.o.   MRN: [id number] S:pt's son reports some bowel incontinence when urinating, otherwise doing well O:BP 106/53  Pulse 79  Temp(Src) 98 F (36.7 C) (Oral)  Resp 18  Ht 6' (1.829 m)  Wt 114 kg (251 lb 5.2 oz)  BMI 34.08 kg/m2  SpO2 96%  Intake/Output Summary (Last 24 hours) at 01/23/13 1416 Last data filed at 01/23/13 1237  Gross per 24 hour  Intake    600 ml  Output    951 ml  Net   -351 ml   Intake/Output: I/O last 3 completed shifts: In: -  Out: 1976 [Urine:1975; Stool:1]  Intake/Output this shift:  Total I/O In: 600 [P.O.:600] Out: 175 [Urine:175] Weight change:  Gen:WD obese hispanic male in NAD CVS:no rub Resp:CTA WUJ:[id number] Ext:no edema   Recent Labs Lab 01/17/13 0830 01/18/13 0602 01/19/13 0435 01/20/13 0640 01/21/13 0545 01/22/13 0420 01/23/13 0714  NA 132* 135 134* 139 137 140 140  K 5.2* 5.5* 5.4* 4.9 5.3* 4.6 4.9  CL 93* 95* 94* 100 99 100 101  CO2 22 29 25 28 25 28 28   GLUCOSE 164* 84 84 82 80 81 86  BUN 90* 58* 74* 40* 53* 40* 54*  CREATININE 13.77* 11.18* 13.52* 9.45* 11.70* 8.62* 11.09*  ALBUMIN 2.7* 2.7* 2.7* 2.5* 2.6* 2.4* 2.7*  CALCIUM 8.7 8.7 8.6 8.7 8.8 8.4 9.1  PHOS 7.0* 6.2* 5.3* 4.6 5.2* 4.9* 4.7*  AST  --   --  19 19 18 20 21   ALT  --   --  23 20 19 19 19    Liver Function Tests:  Recent Labs Lab 01/21/13 0545 01/22/13 0420 01/23/13 0714  AST 18 20 21   ALT 19 19 19   ALKPHOS 81 83 85  BILITOT 0.3 0.3 0.3  PROT 6.2 6.7 6.9  ALBUMIN 2.6* 2.4* 2.7*   No results found for this basename: LIPASE, AMYLASE,  in the last 168 hours No results found for this basename: AMMONIA,  in the last 168 hours CBC:  Recent Labs Lab 01/19/13 0435 01/20/13 0640 01/21/13 0545 01/22/13 0420 01/23/13 0714  WBC 11.5* 10.5 10.3 9.4 9.0  NEUTROABS 7.3 6.1 5.7 4.8 4.7  HGB 8.8* 8.7* 8.8* 9.1* 9.0*  HCT 27.8* 28.5* 28.0* 28.8* 28.7*  MCV 97.2 99.0 98.2 98.3 98.3  PLT 340 327 330  327 340   Cardiac Enzymes: No results found for this basename: CKTOTAL, CKMB, CKMBINDEX, TROPONINI,  in the last 168 hours CBG:  Recent Labs Lab 01/22/13 1758 01/22/13 1835 01/22/13 2120 01/23/13 0728 01/23/13 1135  GLUCAP 68* 118* 102* 95 104*    Iron Studies: No results found for this basename: IRON, TIBC, TRANSFERRIN, FERRITIN,  in the last 72 hours Studies/Results: No results found. Marland Kitchen antiseptic oral rinse  15 mL Mouth Rinse TID WC & HS  . aspirin  325 mg Oral Daily  . calcium acetate  1,334 mg Oral TID WC  . carvedilol  6.25 mg Per NG tube BID WC  . darbepoetin (ARANESP) injection - NON-DIALYSIS  150 mcg Subcutaneous Q Mon-1800  . heparin subcutaneous  5,000 Units Subcutaneous Q8H  . insulin aspart  0-9 Units Subcutaneous TID AC & HS  . ketoconazole   Topical BID  . lanthanum  1,000 mg Oral TID WC  . multivitamin with minerals  1 tablet Oral Daily  . pantoprazole  40 mg Oral Daily  . simvastatin  20 mg Oral q1800  BMET    Component Value Date/Time   NA 140 01/23/2013 0714   K 4.9 01/23/2013 0714   CL 101 01/23/2013 0714   CO2 28 01/23/2013 0714   GLUCOSE 86 01/23/2013 0714   BUN 54* 01/23/2013 0714   CREATININE 11.09* 01/23/2013 0714   CALCIUM 9.1 01/23/2013 0714   GFRNONAA 5* 01/23/2013 0714   GFRAA 5* 01/23/2013 0714   CBC    Component Value Date/Time   WBC 9.0 01/23/2013 0714   RBC 2.92* 01/23/2013 0714   HGB 9.0* 01/23/2013 0714   HCT 28.7* 01/23/2013 0714   PLT 340 01/23/2013 0714   MCV 98.3 01/23/2013 0714   MCH 30.8 01/23/2013 0714   MCHC 31.4 01/23/2013 0714   RDW 13.8 01/23/2013 0714   LYMPHSABS 1.8 01/23/2013 0714   MONOABS 1.1* 01/23/2013 0714   EOSABS 1.1* 01/23/2013 0714   BASOSABS 0.3* 01/23/2013 0714    AKI 2/2 ATN [unclear baseline] / cardiogenic shock, arrest, and contrast nephropathy: Started HD 7/25. Urine output reported to be improving.  1. AKI . Dialysis dependent 7/25 although has improved form anuria to steady urine output. Last dialysis was  Saturday 01/21/13 for hyperkalemia  2. Anemia; on aranesp161mcg given 8/11 last Hb 9.1  3. Electrolytes stable  4. Acid/base controlled  The patient appears to be improving in terms of urine output but has remained dialysis dependent for 3 weeks. [facility] has been consulted to place fistula although we are still anticipating recovery. We shall follow his labs this week encouraged that the urine output has improved.  Assessment/Plan:  1. AKI- has been dialysis-dependent since 12/30/12 but now starting to show improvement of UOP. Will plan to hold off on HD for now and follow UOP, Scr and K levels.  Hopefully he can be discharged to SNF without outpt HD. Will cont to follow closely. 2. Cardiogenic shock- improved 3. Opiate overdose- ?accidental, resolved 4. ABLA/?ACDz- on aranesp 5. HTN 6. Hyperkalemia- improved post HD on 01/21/13 will follow 7. Vascular access- HD catheter in place, AVF on hold as his renal function appears to be improving and had  8. OSA- on CPAP 9. Dispo- hold off on SNF transfer as he may not require outpt HD, if labs improving, will have him f/u with Dr. Hyman Hopes as an outpt and remove PC. 10. diarrhea  Sheridan Gettel A

## 2013-01-23 NOTE — Progress Notes (Addendum)
[facility] hospitalist Progress Note    Nicholas Potts ZOX:[id number] DOB: [birth year] DOA: 12/29/2012  PCP: No primary provider on file.  Admit date: 12/29/2012 Discharge date: 01/23/2013  Time spent:40 minutes  Recommendations for Outpatient Follow-up:  1. Cardiogenic shock/acute CHF NYHA class III; resolved, echocardiogram from 01/11/2013 shows grade 1 diastolic dysfunction.   2. HTN; within AHA guidelines no change the meds require  3. Acute renal failure/ATN; now on hemodialysis T/TH/Sat; per   Dr Hyman Hopes (Nephrologist) I would prepare patient for ESRD management. No return of renal function anticipated soon. Will need placement of AVF possibly Monday with discharge plans to [facility] TTS. Contacted Dr. Hyman Hopes (Nephrologist) and discussed patient's case. Per Dr. Hyman Hopes patient's renal function is improving, and request that patient be kept for another week in order to determine final status of patients renal function. Also allow dialysis PRN for Electrolyte imbalances   4. Accidental opiate poisoning; stable  5. Tinea Pedis; continue Ketoconazole and Pumice stone for Lt Foot, Hydroxyazine for pruritis.   6. OSA; patient doing well on hospital supplied CPAP, will require outpatient sleep study in order to reestablish his baseline.   Discharge Diagnoses:  Principal Problem:   Acute respiratory failure Active Problems:   Cardiogenic shock   Acute renal failure (ARF)   Cardiac and respiratory arrest with VT due to:   Aspiration pneumonia   Accidental opiate poisoning   ARF (acute renal failure) with tubular necrosis requiring HD   Acute systolic congestive heart failure, NYHA class 3   Anemia   Thrombocytopenia, unspecified   HTN (hypertension)   Tinea pedis   OSA on CPAP   Discharge Condition:   Diet recommendation:   Filed Weights   01/19/13 1240 01/20/13 2156 01/21/13 0900  Weight: 109.8 kg (242 lb 1 oz) 113.127 kg (249 lb 6.4 oz) 114 kg (251 lb 5.2 oz)     History of present illness:  1- yo HM PMHx Rt Achilles tendon reconstruction was brought in for apnea due to Unintentional narcotic overdose. Patient was found to have respiratory acidosis and hypothermia requiring intubation in the ER. Subsequent course complicated by hypotension, profound bradycardia and became pulseless requiring CPR. Patient then went into V. tach requiring defibrillation and amiodarone. He was stabilized and transferred to ICU. Emergent echo done showed severe wall motion abnormality. He subsequently coded 2 more times and taken to cath lab immediately. Patient found to have severe cardiogenic shock and severely in the dysfunction with EF of 20%. Patient was placed on the impella dynamic support system along with pressors . Patient also developed oliguric renal failure related to shock for which a hemodialysis catheter was placed and CVVHD initiated. Patient had severe encephalopathy secondary to acute insult. Neurology was consulted and had an EEG done which showed moderate to severe generalized nonspecific slowing. Patient's mental status slowly started to improve and after successful extubation he was transferred to step down. Cardiomyopathy has resolved. ARF not resolving and still oliguric and nephrology continues to follow.TODAY continued to angulate today with a boot and walker and the aid of his son. Son also reports the patient urine output has increased significantly creatinine =11.63 slightly worse than the day before    Hospital Course:    Procedures:  Echocardiogram 01/11/2013 - Left ventricle: The cavity size was normal. Wall thickness was normal. Systolic function was normal. LVEF= 55% to 60%.  Doppler parameters are consistent with abnormal left ventricular relaxation (grade 1 diastolic dysfunction). - Aortic valve: There was no  stenosis. - Mitral valve: No significant regurgitation. - Left atrium: The atrium was mildly dilated. - Right ventricle: The  cavity size was normal. Systolic function was normal.   Consultations:  Nephrology, orthopedics, vascular surgery, cardiology, critical care  Discharge Exam: Filed Vitals:   01/22/13 2348 01/23/13 0437 01/23/13 0938 01/23/13 1604  BP:  117/64 106/53 115/67  Pulse: 80 79 79 70  Temp:  98.7 F (37.1 C) 98 F (36.7 C) 98.4 F (36.9 C)  TempSrc:  Oral Oral   Resp: 18 20 18 20   Height:      Weight:      SpO2:  98% 96% 97%    General: Alert, NAD Cardiovascular: Regular rhythm and rate, negative murmurs rubs or gallops, DP/TP pulses 2+ bilateral Respiratory: Clear to auscultation bilateral Muscle skeletal; left plantar surface of foot showed significant tinea pedis, right Achilles tendon repair no sign of infection, right chest wall Vas-Cath no sign of infection  Discharge Instructions     Medication List    ASK your doctor about these medications       aspirin 325 MG tablet  Take 325 mg by mouth daily.     meloxicam 15 MG tablet  Commonly known as:  MOBIC  Take 15 mg by mouth daily.     ONE-A-DAY 50 PLUS Tabs  Take 1 tablet by mouth daily.     oxycodone 5 MG capsule  Commonly known as:  OXY-IR  Take 10 mg by mouth every 4 (four) hours as needed for pain.     simvastatin 20 MG tablet  Commonly known as:  ZOCOR  Take 20 mg by mouth daily.     traMADol 50 MG tablet  Commonly known as:  ULTRAM  Take 50 mg by mouth daily.       No Known Allergies    The results of significant diagnostics from this hospitalization (including imaging, microbiology, ancillary and laboratory) are listed below for reference.    Significant Diagnostic Studies: Ct Head Wo Contrast  12/29/2012   *RADIOLOGY REPORT*  Clinical Data: Altered mental status.  Possible overdose.  CT HEAD WITHOUT CONTRAST  Technique:  Contiguous axial images were obtained from the base of the skull through the vertex without contrast.  Comparison: Head CT 12/10/2007.  Findings: There is no evidence of acute  intracranial hemorrhage, mass lesion, brain edema or extra-axial fluid collection.  The ventricles and subarachnoid spaces are appropriately sized for age. There is no CT evidence of acute cortical infarction.  There is diffuse mucosal thickening throughout the nasal passages, ethmoid and maxillary sinuses.  The nasopharyngeal airway is closed at the time of imaging (the patient is intubated).  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  The mastoids and middle ears are clear. The calvarium is intact.  IMPRESSION:  1.  No acute intracranial or calvarial findings identified. 2.  Diffuse mucosal thickening throughout the visualized nasal passages and paranasal sinuses. Significant maxillary periodontal disease bilaterally.   Original Report Authenticated By: Carey Bullocks, M.D.   Dg Chest Port 1 View  01/11/2013   *RADIOLOGY REPORT*  Clinical Data: Shortness of breath.  Pneumonia.  PORTABLE CHEST - 1 VIEW  Comparison: Plain film chest 01/08/2013 and 01/09/2013.  Findings: Dialysis catheter remains in place.  Lung volumes are low.  Mild left basilar atelectasis is identified.  There is no pulmonary edema.  No pneumothorax or pleural fluid is identified. Heart size is enlarged.  IMPRESSION:  Mild subsegmental atelectasis left lung base.  No acute  abnormality.   Original Report Authenticated By: Holley Dexter, M.D.   Dg Chest Port 1 View  01/09/2013   *RADIOLOGY REPORT*  Clinical Data: Central catheter placement  PORTABLE CHEST - 1 VIEW  Comparison: Study obtained earlier in the day  Findings: Dual lumen catheter tip is at the cavoatrial junction. Left-sided catheter tip is in the right atrium.  No pneumothorax.  There is mild atelectasis in the left base.  Elsewhere, lungs are clear.  Heart is upper normal in size with normal pulmonary vascularity.  No adenopathy.  IMPRESSION: Catheters as described without pneumothorax.  Mild left base atelectasis.   Original Report Authenticated By: Bretta Bang, M.D.   Dg Chest Port 1 View  01/09/2013   *RADIOLOGY REPORT*  Clinical Data: Follow up pneumonia  PORTABLE CHEST - 1 VIEW  Comparison: Prior radiograph from 01/08/2013  Findings: The the patient has been extubated in the interim.  Right IJ Cordis catheter is stable in position.  Enteric tube is been removed.  Cardiac silhouette is unchanged.  The lungs remain hypoinflated.  There is been interval improvement in aeration of both lung bases with improved bibasilar opacities. Veiling opacity overlying the costophrenic angles are consistent with small pleural effusions, slightly larger on the left.  Osseous structures are unchanged.  IMPRESSION: 1.  Interval improvement in aeration of both lung bases with improved bibasilar opacities status post extubation. Particularly, dense opacity at the left lung base is improved, which may represent improved atelectasis, pleural fluid,  and / or infiltrate. 2.  Small bilateral pleural effusions, left greater than right.   Original Report Authenticated By: Rise Mu, M.D.   Dg Chest Port 1 View  01/08/2013   *RADIOLOGY REPORT*  Clinical Data: Follow up pneumonia  PORTABLE CHEST - 1 VIEW  Comparison: Prior chest x-ray 01/07/2013  Findings: The endotracheal tube is 3.5 cm above the carina.  Left subclavian approach central venous catheter with the tip at the superior cavoatrial junction.  The right IJ approach temporary hemodialysis catheter is noted with the tip in the upper SVC. Visualized nasogastric tube in unchanged position.  Cardiac and mediastinal contours are unchanged.  Inspiratory volumes remain very low and there is left basilar atelectasis versus infiltrate. Small right pleural effusion is improving and there is improved aeration in the right base.  IMPRESSION:  1.  Stable and satisfactory support apparatus. 2.  Similar appearance of dense left basilar opacity which may represent a combination of pleural fluid with atelectasis and / or infiltrate.  3.  Improved aeration in the right base with decreasing pleural fluid and opacity.   Original Report Authenticated By: Malachy Moan, M.D.   Dg Chest Port 1 View  01/07/2013   *RADIOLOGY REPORT*  Clinical Data: Assess infiltrate  PORTABLE CHEST - 1 VIEW  Comparison: Prior radiograph from 01/06/2013  Findings: The the patient remains intubated with the tip of the endotracheal tube located 2.9 cm above the carina.  Enteric tube courses into the abdomen, and appears coiled within the gastric fundus, similar to prior. Right IJ central venous catheter is unchanged.  Left subclavian central venous catheter is in stable position with tip at cavoatrial junction.  Heart size is unchanged.  The lungs remain hypoinflated.  Bilateral layering pleural effusions persist, likely capping the right lung apex.  Associated bibasilar airspace opacities are also not significantly changed, which may read represent atelectasis or infiltrate.  No overt pulmonary edema.  No pneumothorax.  The bony structures are intact.  IMPRESSION: 1.  Support  apparatus as above.  Tip of endotracheal tube 2.9 cm above the carina. 2.  Similar appearance of the chest with persistent hypoinflation with bilateral pleural effusions and associated bibasilar airspace opacities, which may reflect atelectasis or infiltrate.   Original Report Authenticated By: Rise Mu, M.D.   Dg Chest Port 1 View  01/06/2013   *RADIOLOGY REPORT*  Clinical Data: Pleural effusions.  PORTABLE CHEST - 1 VIEW  Comparison: 01/05/2013.  Findings: Endotracheal tube ends 2 cm above the carina.  Gastric suction tube is coiled in the stomach.  The right IJ sheath has been retracted slightly, still in good position at the level of the upper SVC. Left subclavian central venous catheter, tip at the level of the superior cavoatrial junction.  Unchanged heart size and mediastinal contours, distorted by low lung volumes.  Layering bilateral pleural effusions, likely capping the  apical lungs.  There is likely also a lung opacity at the left base. No evident pneumothorax.  IMPRESSION:  1.  Satisfactory positioning of tubes and lines.  Please note that the right IJ catheter has been retracted since yesterday. 2. Low volume lungs with bilateral pleural effusions and left base atelectasis or infiltrate.   Original Report Authenticated By: Tiburcio Pea   Dg Chest Port 1 View  01/05/2013   *RADIOLOGY REPORT*  Clinical Data: Atelectasis.  PORTABLE CHEST - 1 VIEW  Comparison: 01/04/2013.  Findings: The support apparatus is unchanged.  Endotracheal tube tip is 29 mm from the carina.  Pulmonary aeration is improved compared yesterday's exam with decreasing basilar atelectasis. Positional shift in the pleural effusions.  Pulmonary vascular congestion persists.  Cardiopericardial silhouette grossly appears unchanged.  IMPRESSION:  1.  Stable support apparatus. 2.  Improving pulmonary aeration with positional shift in the pleural effusions.  Overall low volume chest.   Original Report Authenticated By: Andreas Newport, M.D.   Dg Chest Port 1 View  01/04/2013   *RADIOLOGY REPORT*  Clinical Data: Evaluate endotracheal tube position.  Infiltrate and atelectasis.  PORTABLE CHEST - 1 VIEW  Comparison: 01/03/2013.  Findings: Endotracheal tube tip is 28 mm from the carina.  Right IJ vas cath.  Enteric tube is present with the tip not visualized. There is probably a loop of the enteric tube in the proximal fundus of the stomach.  Markedly low lung volumes remain present with basilar atelectasis and diffuse bilateral basilar predominant airspace disease most compatible with pulmonary edema.  Bilateral pleural effusions are also present.  Left subclavian central line appears similar with the tip in the mid-to-lower SVC.  IMPRESSION:  1.  Endotracheal tube tip 28 mm from the carina. 2.  Other support apparatus unchanged. 3.  Unchanged pulmonary aeration with bilateral airspace disease/edema, pleural  effusions and atelectasis.   Original Report Authenticated By: Andreas Newport, M.D.   Dg Chest Port 1 View  01/03/2013   *RADIOLOGY REPORT*  Clinical Data: Endotracheal tube placement  PORTABLE CHEST - 1 VIEW  Comparison: Prior chest x-ray 01/02/2013  Findings: The tip of the endotracheal tube is 1.5 cm above the carina.  The patient is in a kyphotic position.  The right IJ temporary hemodialysis catheter tip projects over the distal SVC. The tip of the left subclavian approach central venous catheter projects over the superior cavoatrial junction. The nasogastric tube can be seen coiled within the gastric fundus.  Inspiratory volumes are very low.  There is mild pulmonary edema and diffuse bilateral interstitial and airspace opacities.  Probable bilateral layering effusions.  Stable cardiomegaly.  IMPRESSION:  1.  The tip of the endotracheal tube is 1.5 cm above the carina. Relatively distal placement of the endotracheal tube likely exaggerated by the patient is kyphotic positioning. 2.  Other support apparatus in stable and satisfactory position. 3.  Similar appearance of the lungs with very low inspiratory volumes, edema, bilateral layering effusions and infiltrate versus atelectasis.   Original Report Authenticated By: Malachy Moan, M.D.   Dg Chest Port 1 View  01/02/2013   *RADIOLOGY REPORT*  Clinical Data: Endotracheal tube position  PORTABLE CHEST - 1 VIEW  Comparison: 01/01/2013  Findings: Endotracheal tube is appropriately positioned.  Insert NG right IJ central line tip terminates over the distal SVC.  Mild cardiomegaly process of central vascular congestion and small pleural effusions obscuring lung bases.  IMPRESSION: Support apparatus as above.  Central vascular congestion with small pleural effusions.   Original Report Authenticated By: Christiana Pellant, M.D.   Dg Chest Port 1 View  01/01/2013   *RADIOLOGY REPORT*  Clinical Data: Hypoxia  PORTABLE CHEST - 1 VIEW  Comparison: December 31, 2012   Findings:  Endotracheal tube tip is 2.5 cm above the carina. Central catheters are unchanged in position. Nasogastric tube tip and side port are in the stomach region.  No apparent pneumothorax.  There is consolidation in the left lower lobe with small left effusion.  The right lung is clear.  Heart is borderline prominent with normal pulmonary vascularity.  No appreciable adenopathy.  IMPRESSION:  Tube and catheter positions as described.  No pneumothorax. There is consolidation in the left lower lobe with small left effusion.  Right lung clear.   Original Report Authenticated By: Bretta Bang, M.D.   Dg Chest Port 1 View  12/31/2012   *RADIOLOGY REPORT*  Clinical Data: Critically ill patient with possible change in position of one of the indwelling support apparatus, the Impella device.  PORTABLE CHEST - 1 VIEW 12/31/2012 1141 hours:  Comparison: Portable chest x-ray earlier same date 0608 hours.  Findings: The Impella device which has been placed from a femoral approach is unchanged in position since the examination earlier today, with the distal hook in the left ventricle and the device traversing the aortic valve.  Endotracheal tube tip somewhat low, chest approximately 2 cm above the carina, as noted previously. Remaining support apparatus satisfactory with the Swan-Ganz catheter tip overlying the proximal right main pulmonary artery, the right jugular central venous catheter tip overlying the lower SVC, the left subclavian central venous catheter tip projecting over the upper SVC, and the nasogastric tube looped in the stomach. External pacing pads.  Markedly suboptimal inspiration with atelectasis in the lower lobes, right greater than left, unchanged. Pulmonary venous hypertension without overt edema, unchanged.  No new pulmonary parenchymal abnormalities.  IMPRESSION:  1.  Support apparatus satisfactory with the exception that the endotracheal tube tip is low, just about 2 cm above the carina.  Specifically, no change in the position of the Impella device since earlier same date. 2.  Suboptimal inspiration with atelectasis in the lung bases, right greater than left, unchanged since earlier in the day. 3.  Pulmonary venous hypertension without overt edema, also unchanged. 4.  No new pulmonary parenchymal abnormalities.   Original Report Authenticated By: Hulan Saas, M.D.   Dg Chest Port 1 View  12/31/2012   *RADIOLOGY REPORT*  Clinical Data: Endotracheal tube placement  PORTABLE CHEST - 1 VIEW  Comparison: 12/30/2012  Findings: Endotracheal tube tip is 1.1 cm from the carina.  NG tube stable.  Swan-Ganz  catheter from the abdomen is stable with its tip in the right hilum.  Right internal jugular dialysis catheter is stable.  Left subclavian central venous catheter is stable.  Low volumes, bilateral pleural effusions, and mild edema are not significantly changed.  No pneumothorax.  IMPRESSION: Stable pleural effusions and edema.   Original Report Authenticated By: Jolaine Click, M.D.   Dg Chest Port 1 View  12/30/2012   *RADIOLOGY REPORT*  Clinical Data: Catheter placement  PORTABLE CHEST - 1 VIEW  Comparison: Same date.  Findings: No change is noted in position of endotracheal or nasogastric tubes.  Left subclavian catheter is unchanged in position with tip in SVC.  There has been interval placement of right internal jugular dialysis catheter with distal tip in expected position of the SVC.  No pneumothorax is noted. Hypoinflation of the lungs is noted.  Central pulmonary vascular congestion is noted.  IMPRESSION: Status post placement of right internal jugular dialysis catheter with tip in expected position of the SVC.  No pneumothorax is seen.   Original Report Authenticated By: Lupita Raider.,  M.D.   Dg Chest Port 1 View  12/30/2012   *RADIOLOGY REPORT*  Clinical Data: Endotracheal tube placement.  PORTABLE CHEST - 1 VIEW  Comparison: 12/29/2012.  Findings: Low volume chest.  The  endotracheal tube tip is 24 mm from the carina.  Defibrillator pads over the chest.  Enteric tube is present with the tip not visualized.  Inferior presumed femoral approach Swan-Ganz catheter is present with the tip in the descending right pulmonary artery.  Progressive atelectasis/volume loss in the right middle and right lower lobes.  Prominent atelectasis associated with lower lung volumes.  Pulmonary vascular congestion is accentuated by the low volumes. Left subclavian central line remains present with the tip in the mid to lower SVC.  IMPRESSION:  1.  Stable support apparatus aside from the addition of what appears to be a femoral approach Swan-Ganz catheter with the tip in the descending right pulmonary artery. 2.  Lower lung volumes with increasing atelectasis, most prominent at the right base.   Original Report Authenticated By: Andreas Newport, M.D.   Dg Chest Portable 1 View  12/29/2012   *RADIOLOGY REPORT*  Clinical Data: Unresponsive  PORTABLE CHEST - 1 VIEW  Comparison: Chest radiograph 12/29/2012  Findings: Endotracheal tube is 3.5 cm from carina.  Left central venous line is unchanged.  Stable cardiac silhouette.  There is central venous congestion similar prior.  There is bibasilar atelectasis greater on the right not changed.  IMPRESSION:  1.  Stable support apparatus. 2.  No interval change.  3.  Low lung volumes, basilar atelectasis, and central venous congestion.   Original Report Authenticated By: Genevive Bi, M.D.   Dg Chest Port 1 View  12/29/2012   *RADIOLOGY REPORT*  Clinical Data: Evaluate endotracheal tube and left subclavian line placement  PORTABLE CHEST - 1 VIEW  Comparison: Earlier same day  Findings:  Grossly unchanged enlarged cardiac silhouette and mediastinal contours, possibly attributable to decreased lung volumes and AP projection.  An endotracheal tube overlies the tracheal air column with tip superior to the carina.  Interval placement of a left subclavian vein  approach central venous catheter with tip projecting over the superior cavoatrial junction.  Enteric tube tip and side port project below the left hemidiaphragm.  No pneumothorax.  Pulmonary vasculature is indistinct with cephalization of flow.  Small/trace bilateral effusions are suspected with minimal amount of fluid layering within the right minor fissure.  Worsening perihilar opacities.  Unchanged bones.  IMPRESSION: 1.  Appropriately positioned support apparatus as above.  No pneumothorax. 2.  Suspected pulmonary edema. 3.  Reduced lung volumes with worsening perihilar opacities, likely atelectasis.  findings suggestive of pulmonary edema   Original Report Authenticated By: Tacey Ruiz, MD   Dg Chest Portable 1 View  12/29/2012   *RADIOLOGY REPORT*  Clinical Data: Status post intubation  PORTABLE CHEST - 1 VIEW  Comparison: None.  Findings: An endotracheal tube and nasogastric catheter are noted. Nasogastric catheter appears coiled within the distal esophagus. The endotracheal tube is in satisfactory position.  The lungs are well-aerated with some mild right basilar atelectasis.   IMPRESSION: Tubes and lines as described above.  Mild right basilar atelectasis.   Original Report Authenticated By: Alcide Clever, M.D.   Dg Abd Portable 1v  12/30/2012   *RADIOLOGY REPORT*  Clinical Data: NG tube placement  PORTABLE ABDOMEN - 1 VIEW  Comparison: None.  Findings: The NG tube is curled in the fundus of the stomach.  The tip is in the body.  No disproportionate dilatation of bowel. Multiple linear devices project over the abdomen.  IMPRESSION: NG tube is coiled in the stomach   Original Report Authenticated By: Jolaine Click, M.D.    Microbiology: No results found for this or any previous visit (from the past 240 hour(s)).   Labs: Basic Metabolic Panel:  Recent Labs Lab 01/19/13 0435 01/20/13 0640 01/21/13 0545 01/22/13 0420 01/23/13 0714  NA 134* 139 137 140 140  K 5.4* 4.9 5.3* 4.6 4.9  CL 94*  100 99 100 101  CO2 25 28 25 28 28   GLUCOSE 84 82 80 81 86  BUN 74* 40* 53* 40* 54*  CREATININE 13.52* 9.45* 11.70* 8.62* 11.09*  CALCIUM 8.6 8.7 8.8 8.4 9.1  MG 2.7* 2.4 2.6* 2.4 2.5  PHOS 5.3* 4.6 5.2* 4.9* 4.7*   Liver Function Tests:  Recent Labs Lab 01/19/13 0435 01/20/13 0640 01/21/13 0545 01/22/13 0420 01/23/13 0714  AST 19 19 18 20 21   ALT 23 20 19 19 19   ALKPHOS 95 82 81 83 85  BILITOT 0.3 0.3 0.3 0.3 0.3  PROT 6.9 6.2 6.2 6.7 6.9  ALBUMIN 2.7* 2.5* 2.6* 2.4* 2.7*   No results found for this basename: LIPASE, AMYLASE,  in the last 168 hours No results found for this basename: AMMONIA,  in the last 168 hours CBC:  Recent Labs Lab 01/19/13 0435 01/20/13 0640 01/21/13 0545 01/22/13 0420 01/23/13 0714  WBC 11.5* 10.5 10.3 9.4 9.0  NEUTROABS 7.3 6.1 5.7 4.8 4.7  HGB 8.8* 8.7* 8.8* 9.1* 9.0*  HCT 27.8* 28.5* 28.0* 28.8* 28.7*  MCV 97.2 99.0 98.2 98.3 98.3  PLT 340 327 330 327 340   Cardiac Enzymes: No results found for this basename: CKTOTAL, CKMB, CKMBINDEX, TROPONINI,  in the last 168 hours BNP: BNP (last 3 results)  Recent Labs  12/30/12 0500  PROBNP 3296.0*   CBG:  Recent Labs Lab 01/22/13 1835 01/22/13 2120 01/23/13 0728 01/23/13 1135 01/23/13 1624  GLUCAP 118* 102* 95 104* 91       Signed:  WOODS, CURTIS, J  [facility] 01/23/2013, 7:12 PM

## 2013-01-23 NOTE — Progress Notes (Signed)
Physical Therapy Treatment Patient Details Name: Nicholas Potts MRN: [id number] DOB: Dec 20, 1960 Today's Date: 01/23/2013 Time: 4098-1191 PT Time Calculation (min): 12 min  PT Assessment / Plan / Recommendation  History of Present Illness 52 year old male for evaluation of shock. The patient was intubated and had been coded 3 separate times. The patient had repair of his Achilles tendon 2 days prior to admission. He apparently had some difficulties with respiration postoperatively but otherwise did well. He has been taking pain medication as instructed based on his family's report. He was last seen lucid at 9 AM and at approximately 11:30 AM his family found him unresponsive and "white foam coming from his mouth". EMS was called and he was brought to the emergency room where he suffered cardiac arrest 3 separate times. There is note of PEA arrest.   PT Comments   Pt ambulated in hallway.  Pt reports no pain today however still dizzy with forward lean so required assist for donning/doffing CAM boot.   Follow Up Recommendations  SNF     Does the patient have the potential to tolerate intense rehabilitation     Barriers to Discharge        Equipment Recommendations  Rolling walker with 5" wheels    Recommendations for Other Services    Frequency     Progress towards PT Goals Progress towards PT goals: Progressing toward goals  Plan Current plan remains appropriate    Precautions / Restrictions Precautions Precautions: Fall Required Braces or Orthoses: Other Brace/Splint Other Brace/Splint: CAM boot with heel lift Restrictions RLE Weight Bearing: Weight bearing as tolerated Other Position/Activity Restrictions: CAM boot with heel lift for WBAT   Pertinent Vitals/Pain n/a    Mobility  Bed Mobility Bed Mobility: Supine to Sit;Sit to Supine Supine to Sit: 5: Supervision;With rails;HOB elevated Sit to Supine: 5: Supervision;HOB elevated Details for Bed Mobility Assistance:  Needs increased time.  Required assist to don/doff CAM boot as he reports still dizzy with leaning forward Transfers Transfers: Sit to Stand;Stand to Sit Sit to Stand: With upper extremity assist;From bed;4: Min guard Stand to Sit: With upper extremity assist;To bed;4: Min guard Details for Transfer Assistance: verbal cues for hand placement Ambulation/Gait Ambulation/Gait Assistance: 4: Min guard Ambulation Distance (Feet): 110 Feet Assistive device: Rolling walker Ambulation/Gait Assistance Details: pt reports no pain today and encouraged increasing distance, verbal cues for RW distance Gait Pattern: Trunk flexed;Decreased stance time - right;Step-through pattern Stairs: No Wheelchair Mobility Wheelchair Mobility: No    Exercises     PT Diagnosis:    PT Problem List:   PT Treatment Interventions:     PT Goals (current goals can now be found in the care plan section) Acute Rehab PT Goals PT Goal Formulation: With patient/family Time For Goal Achievement: 01/30/13 Potential to Achieve Goals: Good  Visit Information  Last PT Received On: 01/23/13 Assistance Needed: +1 History of Present Illness: 52 year old male for evaluation of shock. The patient was intubated and had been coded 3 separate times. The patient had repair of his Achilles tendon 2 days prior to admission. He apparently had some difficulties with respiration postoperatively but otherwise did well. He has been taking pain medication as instructed based on his family's report. He was last seen lucid at 9 AM and at approximately 11:30 AM his family found him unresponsive and "white foam coming from his mouth". EMS was called and he was brought to the emergency room where he suffered cardiac arrest 3 separate times. There is note  of PEA arrest.    Subjective Data      Cognition  Cognition Arousal/Alertness: Awake/alert Behavior During Therapy: WFL for tasks assessed/performed Overall Cognitive Status: Within  Functional Limits for tasks assessed    Balance     End of Session PT - End of Session Equipment Utilized During Treatment: Other (comment) (CAM boot) Activity Tolerance: Patient tolerated treatment well Patient left: with call bell/phone within reach;with family/visitor present;in bed   GP     Tanvi Gatling,KATHrine E 01/23/2013, 12:41 PM Zenovia Jarred, PT, DPT 01/23/2013 Pager: [phone]

## 2013-01-23 NOTE — Progress Notes (Signed)
NUTRITION FOLLOW UP  Intervention:   1. RD to set up snack.   Nutrition Dx:   Inadequate oral intake, improving  Goal:   Meet >/=90% estimated nutrition needs. Improving  Monitor:   Po intake, weight trends, labs, I/O's  Assessment:   Pt diet advanced per SLP. Eating well most meals.  Renal function/urin out put improving. Planned to stay in hospital for PRN HD and assess if renal function will continue to improve.   Son reports pt is not eating as much as he usually does. Might not be liking the meals? RD to set up HS snack.  Son concerned about bowel incontinence, MD aware.   Height: Ht Readings from Last 1 Encounters:  01/16/13 6' (1.829 m)    Weight Status:   Wt Readings from Last 1 Encounters:  01/21/13 251 lb 5.2 oz (114 kg)  trending up x 3 days,   Re-estimated needs:  Kcal: 2300-2500 Protein: 120-130 gm  Fluid: 1.2 L   Skin: chest and heel incision, skin tear to groin.    Diet Order: Cardiac   Intake/Output Summary (Last 24 hours) at 01/23/13 1427 Last data filed at 01/23/13 1237  Gross per 24 hour  Intake    600 ml  Output    951 ml  Net   -351 ml    Last BM: 8/17   Labs:   Recent Labs Lab 01/21/13 0545 01/22/13 0420 01/23/13 0714  NA 137 140 140  K 5.3* 4.6 4.9  CL 99 100 101  CO2 25 28 28   BUN 53* 40* 54*  CREATININE 11.70* 8.62* 11.09*  CALCIUM 8.8 8.4 9.1  MG 2.6* 2.4 2.5  PHOS 5.2* 4.9* 4.7*  GLUCOSE 80 81 86    CBG (last 3)   Recent Labs  01/22/13 2120 01/23/13 0728 01/23/13 1135  GLUCAP 102* 95 104*    Scheduled Meds: . antiseptic oral rinse  15 mL Mouth Rinse TID WC & HS  . aspirin  325 mg Oral Daily  . calcium acetate  1,334 mg Oral TID WC  . carvedilol  6.25 mg Per NG tube BID WC  . darbepoetin (ARANESP) injection - NON-DIALYSIS  150 mcg Subcutaneous Q Mon-1800  . heparin subcutaneous  5,000 Units Subcutaneous Q8H  . insulin aspart  0-9 Units Subcutaneous TID AC & HS  . ketoconazole   Topical BID  .  lanthanum  1,000 mg Oral TID WC  . multivitamin with minerals  1 tablet Oral Daily  . pantoprazole  40 mg Oral Daily  . simvastatin  20 mg Oral q1800    Continuous Infusions: . sodium chloride Stopped (01/01/13 1400)  . sodium chloride Stopped (12/31/12 2300)    Clarene Duke RD, LDN Pager [phone] After Hours pager [phone]

## 2013-01-24 DIAGNOSIS — R5381 Other malaise: Secondary | ICD-10-CM | POA: Diagnosis present

## 2013-01-24 LAB — COMPREHENSIVE METABOLIC PANEL
ALT: 17 U/L (ref 0–53)
AST: 22 U/L (ref 0–37)
Albumin: 2.7 g/dL — ABNORMAL LOW (ref 3.5–5.2)
Alkaline Phosphatase: 87 U/L (ref 39–117)
Calcium: 9 mg/dL (ref 8.4–10.5)
Potassium: 5 mEq/L (ref 3.5–5.1)
Sodium: 141 mEq/L (ref 135–145)
Total Protein: 6.5 g/dL (ref 6.0–8.3)

## 2013-01-24 LAB — CBC WITH DIFFERENTIAL/PLATELET
Band Neutrophils: 0 % (ref 0–10)
Basophils Absolute: 0.1 10*3/uL (ref 0.0–0.1)
Eosinophils Absolute: 1.1 10*3/uL — ABNORMAL HIGH (ref 0.0–0.7)
Eosinophils Relative: 12 % — ABNORMAL HIGH (ref 0–5)
HCT: 28.9 % — ABNORMAL LOW (ref 39.0–52.0)
MCH: 30.6 pg (ref 26.0–34.0)
MCV: 98.3 fL (ref 78.0–100.0)
Metamyelocytes Relative: 0 %
Monocytes Absolute: 1.1 10*3/uL — ABNORMAL HIGH (ref 0.1–1.0)
Myelocytes: 0 %
Platelets: 349 10*3/uL (ref 150–400)
RBC: 2.94 MIL/uL — ABNORMAL LOW (ref 4.22–5.81)
nRBC: 0 /100 WBC

## 2013-01-24 LAB — GLUCOSE, CAPILLARY
Glucose-Capillary: 76 mg/dL (ref 70–99)
Glucose-Capillary: 77 mg/dL (ref 70–99)
Glucose-Capillary: 88 mg/dL (ref 70–99)

## 2013-01-24 NOTE — Progress Notes (Signed)
[facility] hospitalist Progress Note    Nicholas Potts YNW:[id number] DOB: 1961/05/10 DOA: 12/29/2012  PCP: No primary provider on file.  Admit date: 12/29/2012 Discharge date: 01/24/2013  Time spent:40 minutes  Recommendations for Outpatient Follow-up:  1. Cardiogenic shock/acute CHF NYHA class III; resolved, echocardiogram from 01/11/2013 shows grade 1 diastolic dysfunction.   2. HTN; within AHA guidelines no change the meds require  3. Acute renal failure/ATN; now on hemodialysis T/TH/Sat; per   Dr Hyman Hopes (Nephrologist) I would prepare patient for ESRD management. No return of renal function anticipated soon. Will need placement of AVF possibly Monday with discharge plans to [facility] TTS. Contacted Dr. Hyman Hopes (Nephrologist) and discussed patient's case. Per Dr. Hyman Hopes patient's renal function is improving, and request that patient be kept for another week in order to determine final status of patients renal function. Also allow dialysis PRN for Electrolyte imbalances   4. Accidental opiate poisoning; stable  5. Tinea Pedis; continue Ketoconazole and Pumice stone for Lt Foot, Hydroxyazine for pruritis.   6. OSA; patient doing well on hospital supplied CPAP, will require outpatient sleep study in order to reestablish his baseline.  --Counseled patient that he must wear his CPAP at night and that if he takes it off to go to the bathroom the nurses can help him readjust the mask (this was translated through his daughters)  7. Deconditioning; daughters helped patient walk the hallway in his walking boot and with his walker   Discharge Diagnoses:  Principal Problem:   Acute respiratory failure Active Problems:   Cardiogenic shock   Acute renal failure (ARF)   Cardiac and respiratory arrest with VT due to:   Aspiration pneumonia   Accidental opiate poisoning   ARF (acute renal failure) with tubular necrosis requiring HD   Acute systolic congestive heart failure, NYHA class  3   Anemia   Thrombocytopenia, unspecified   HTN (hypertension)   Tinea pedis   OSA on CPAP   Discharge Condition:   Diet recommendation:   Filed Weights   01/20/13 2156 01/21/13 0900 01/23/13 2236  Weight: 113.127 kg (249 lb 6.4 oz) 114 kg (251 lb 5.2 oz) 111.6 kg (246 lb 0.5 oz)    History of present illness:  63- yo HM PMHx Rt Achilles tendon reconstruction was brought in for apnea due to Unintentional narcotic overdose. Patient was found to have respiratory acidosis and hypothermia requiring intubation in the ER. Subsequent course complicated by hypotension, profound bradycardia and became pulseless requiring CPR. Patient then went into V. tach requiring defibrillation and amiodarone. He was stabilized and transferred to ICU. Emergent echo done showed severe wall motion abnormality. He subsequently coded 2 more times and taken to cath lab immediately. Patient found to have severe cardiogenic shock and severely in the dysfunction with EF of 20%. Patient was placed on the impella dynamic support system along with pressors . Patient also developed oliguric renal failure related to shock for which a hemodialysis catheter was placed and CVVHD initiated. Patient had severe encephalopathy secondary to acute insult. Neurology was consulted and had an EEG done which showed moderate to severe generalized nonspecific slowing. Patient's mental status slowly started to improve and after successful extubation he was transferred to step down. Cardiomyopathy has resolved. ARF not resolving and still oliguric and nephrology continues to follow.TODAY continued to angulate today with a boot and walker and the aid of his son. Son also reports the patient urine output has increased significantly creatinine =11.63 slightly worse than the  day before    Hospital Course:    Procedures:  Echocardiogram 01/11/2013 - Left ventricle: The cavity size was normal. Wall thickness was normal. Systolic function was  normal. LVEF= 55% to 60%.  Doppler parameters are consistent with abnormal left ventricular relaxation (grade 1 diastolic dysfunction). - Aortic valve: There was no stenosis. - Mitral valve: No significant regurgitation. - Left atrium: The atrium was mildly dilated. - Right ventricle: The cavity size was normal. Systolic function was normal.   Consultations:  Nephrology, orthopedics, vascular surgery, cardiology, critical care  Discharge Exam: Filed Vitals:   01/23/13 2236 01/23/13 2327 01/24/13 0542 01/24/13 0730  BP: 120/71  104/62 125/76  Pulse: 73  74 78  Temp: 99 F (37.2 C)  99 F (37.2 C) 98.4 F (36.9 C)  TempSrc: Oral  Oral Oral  Resp: 18 18 20 20   Height:      Weight: 111.6 kg (246 lb 0.5 oz)     SpO2: 99% 99% 100% 100%    General: Alert, NAD Cardiovascular: Regular rhythm and rate, negative murmurs rubs or gallops, DP/TP pulses 2+ bilateral Respiratory: Clear to auscultation bilateral Muscle skeletal; left plantar surface of foot showed significant tinea pedis, right Achilles tendon repair no sign of infection, right chest wall Vas-Cath no sign of infection  Discharge Instructions     Medication List    ASK your doctor about these medications       aspirin 325 MG tablet  Take 325 mg by mouth daily.     meloxicam 15 MG tablet  Commonly known as:  MOBIC  Take 15 mg by mouth daily.     ONE-A-DAY 50 PLUS Tabs  Take 1 tablet by mouth daily.     oxycodone 5 MG capsule  Commonly known as:  OXY-IR  Take 10 mg by mouth every 4 (four) hours as needed for pain.     simvastatin 20 MG tablet  Commonly known as:  ZOCOR  Take 20 mg by mouth daily.     traMADol 50 MG tablet  Commonly known as:  ULTRAM  Take 50 mg by mouth daily.       No Known Allergies    The results of significant diagnostics from this hospitalization (including imaging, microbiology, ancillary and laboratory) are listed below for reference.    Significant Diagnostic Studies: Ct  Head Wo Contrast  12/29/2012   *RADIOLOGY REPORT*  Clinical Data: Altered mental status.  Possible overdose.  CT HEAD WITHOUT CONTRAST  Technique:  Contiguous axial images were obtained from the base of the skull through the vertex without contrast.  Comparison: Head CT 12/10/2007.  Findings: There is no evidence of acute intracranial hemorrhage, mass lesion, brain edema or extra-axial fluid collection.  The ventricles and subarachnoid spaces are appropriately sized for age. There is no CT evidence of acute cortical infarction.  There is diffuse mucosal thickening throughout the nasal passages, ethmoid and maxillary sinuses.  The nasopharyngeal airway is closed at the time of imaging (the patient is intubated).  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  The mastoids and middle ears are clear. The calvarium is intact.  IMPRESSION:  1.  No acute intracranial or calvarial findings identified. 2.  Diffuse mucosal thickening throughout the visualized nasal passages and paranasal sinuses. Significant maxillary periodontal disease bilaterally.   Original Report Authenticated By: Carey Bullocks, M.D.   Dg Chest Port 1 View  01/11/2013   *RADIOLOGY REPORT*  Clinical Data: Shortness of breath.  Pneumonia.  PORTABLE CHEST -  1 VIEW  Comparison: Plain film chest 01/08/2013 and 01/09/2013.  Findings: Dialysis catheter remains in place.  Lung volumes are low.  Mild left basilar atelectasis is identified.  There is no pulmonary edema.  No pneumothorax or pleural fluid is identified. Heart size is enlarged.  IMPRESSION:  Mild subsegmental atelectasis left lung base.  No acute abnormality.   Original Report Authenticated By: Holley Dexter, M.D.   Dg Chest Port 1 View  01/09/2013   *RADIOLOGY REPORT*  Clinical Data: Central catheter placement  PORTABLE CHEST - 1 VIEW  Comparison: Study obtained earlier in the day  Findings: Dual lumen catheter tip is at the cavoatrial junction. Left-sided catheter tip is  in the right atrium.  No pneumothorax.  There is mild atelectasis in the left base.  Elsewhere, lungs are clear.  Heart is upper normal in size with normal pulmonary vascularity.  No adenopathy.  IMPRESSION: Catheters as described without pneumothorax.  Mild left base atelectasis.   Original Report Authenticated By: Bretta Bang, M.D.   Dg Chest Port 1 View  01/09/2013   *RADIOLOGY REPORT*  Clinical Data: Follow up pneumonia  PORTABLE CHEST - 1 VIEW  Comparison: Prior radiograph from 01/08/2013  Findings: The the patient has been extubated in the interim.  Right IJ Cordis catheter is stable in position.  Enteric tube is been removed.  Cardiac silhouette is unchanged.  The lungs remain hypoinflated.  There is been interval improvement in aeration of both lung bases with improved bibasilar opacities. Veiling opacity overlying the costophrenic angles are consistent with small pleural effusions, slightly larger on the left.  Osseous structures are unchanged.  IMPRESSION: 1.  Interval improvement in aeration of both lung bases with improved bibasilar opacities status post extubation. Particularly, dense opacity at the left lung base is improved, which may represent improved atelectasis, pleural fluid,  and / or infiltrate. 2.  Small bilateral pleural effusions, left greater than right.   Original Report Authenticated By: Rise Mu, M.D.   Dg Chest Port 1 View  01/08/2013   *RADIOLOGY REPORT*  Clinical Data: Follow up pneumonia  PORTABLE CHEST - 1 VIEW  Comparison: Prior chest x-ray 01/07/2013  Findings: The endotracheal tube is 3.5 cm above the carina.  Left subclavian approach central venous catheter with the tip at the superior cavoatrial junction.  The right IJ approach temporary hemodialysis catheter is noted with the tip in the upper SVC. Visualized nasogastric tube in unchanged position.  Cardiac and mediastinal contours are unchanged.  Inspiratory volumes remain very low and there is left basilar  atelectasis versus infiltrate. Small right pleural effusion is improving and there is improved aeration in the right base.  IMPRESSION:  1.  Stable and satisfactory support apparatus. 2.  Similar appearance of dense left basilar opacity which may represent a combination of pleural fluid with atelectasis and / or infiltrate. 3.  Improved aeration in the right base with decreasing pleural fluid and opacity.   Original Report Authenticated By: Malachy Moan, M.D.   Dg Chest Port 1 View  01/07/2013   *RADIOLOGY REPORT*  Clinical Data: Assess infiltrate  PORTABLE CHEST - 1 VIEW  Comparison: Prior radiograph from 01/06/2013  Findings: The the patient remains intubated with the tip of the endotracheal tube located 2.9 cm above the carina.  Enteric tube courses into the abdomen, and appears coiled within the gastric fundus, similar to prior. Right IJ central venous catheter is unchanged.  Left subclavian central venous catheter is in stable position with tip at cavoatrial  junction.  Heart size is unchanged.  The lungs remain hypoinflated.  Bilateral layering pleural effusions persist, likely capping the right lung apex.  Associated bibasilar airspace opacities are also not significantly changed, which may read represent atelectasis or infiltrate.  No overt pulmonary edema.  No pneumothorax.  The bony structures are intact.  IMPRESSION: 1.  Support apparatus as above.  Tip of endotracheal tube 2.9 cm above the carina. 2.  Similar appearance of the chest with persistent hypoinflation with bilateral pleural effusions and associated bibasilar airspace opacities, which may reflect atelectasis or infiltrate.   Original Report Authenticated By: Rise Mu, M.D.   Dg Chest Port 1 View  01/06/2013   *RADIOLOGY REPORT*  Clinical Data: Pleural effusions.  PORTABLE CHEST - 1 VIEW  Comparison: 01/05/2013.  Findings: Endotracheal tube ends 2 cm above the carina.  Gastric suction tube is coiled in the stomach.  The right  IJ sheath has been retracted slightly, still in good position at the level of the upper SVC. Left subclavian central venous catheter, tip at the level of the superior cavoatrial junction.  Unchanged heart size and mediastinal contours, distorted by low lung volumes.  Layering bilateral pleural effusions, likely capping the apical lungs.  There is likely also a lung opacity at the left base. No evident pneumothorax.  IMPRESSION:  1.  Satisfactory positioning of tubes and lines.  Please note that the right IJ catheter has been retracted since yesterday. 2. Low volume lungs with bilateral pleural effusions and left base atelectasis or infiltrate.   Original Report Authenticated By: Tiburcio Pea   Dg Chest Port 1 View  01/05/2013   *RADIOLOGY REPORT*  Clinical Data: Atelectasis.  PORTABLE CHEST - 1 VIEW  Comparison: 01/04/2013.  Findings: The support apparatus is unchanged.  Endotracheal tube tip is 29 mm from the carina.  Pulmonary aeration is improved compared yesterday's exam with decreasing basilar atelectasis. Positional shift in the pleural effusions.  Pulmonary vascular congestion persists.  Cardiopericardial silhouette grossly appears unchanged.  IMPRESSION:  1.  Stable support apparatus. 2.  Improving pulmonary aeration with positional shift in the pleural effusions.  Overall low volume chest.   Original Report Authenticated By: Andreas Newport, M.D.   Dg Chest Port 1 View  01/04/2013   *RADIOLOGY REPORT*  Clinical Data: Evaluate endotracheal tube position.  Infiltrate and atelectasis.  PORTABLE CHEST - 1 VIEW  Comparison: 01/03/2013.  Findings: Endotracheal tube tip is 28 mm from the carina.  Right IJ vas cath.  Enteric tube is present with the tip not visualized. There is probably a loop of the enteric tube in the proximal fundus of the stomach.  Markedly low lung volumes remain present with basilar atelectasis and diffuse bilateral basilar predominant airspace disease most compatible with pulmonary  edema.  Bilateral pleural effusions are also present.  Left subclavian central line appears similar with the tip in the mid-to-lower SVC.  IMPRESSION:  1.  Endotracheal tube tip 28 mm from the carina. 2.  Other support apparatus unchanged. 3.  Unchanged pulmonary aeration with bilateral airspace disease/edema, pleural effusions and atelectasis.   Original Report Authenticated By: Andreas Newport, M.D.   Dg Chest Port 1 View  01/03/2013   *RADIOLOGY REPORT*  Clinical Data: Endotracheal tube placement  PORTABLE CHEST - 1 VIEW  Comparison: Prior chest x-ray 01/02/2013  Findings: The tip of the endotracheal tube is 1.5 cm above the carina.  The patient is in a kyphotic position.  The right IJ temporary hemodialysis catheter tip projects over the  distal SVC. The tip of the left subclavian approach central venous catheter projects over the superior cavoatrial junction. The nasogastric tube can be seen coiled within the gastric fundus.  Inspiratory volumes are very low.  There is mild pulmonary edema and diffuse bilateral interstitial and airspace opacities.  Probable bilateral layering effusions.  Stable cardiomegaly.  IMPRESSION:  1.  The tip of the endotracheal tube is 1.5 cm above the carina. Relatively distal placement of the endotracheal tube likely exaggerated by the patient is kyphotic positioning. 2.  Other support apparatus in stable and satisfactory position. 3.  Similar appearance of the lungs with very low inspiratory volumes, edema, bilateral layering effusions and infiltrate versus atelectasis.   Original Report Authenticated By: Malachy Moan, M.D.   Dg Chest Port 1 View  01/02/2013   *RADIOLOGY REPORT*  Clinical Data: Endotracheal tube position  PORTABLE CHEST - 1 VIEW  Comparison: 01/01/2013  Findings: Endotracheal tube is appropriately positioned.  Insert NG right IJ central line tip terminates over the distal SVC.  Mild cardiomegaly process of central vascular congestion and small pleural  effusions obscuring lung bases.  IMPRESSION: Support apparatus as above.  Central vascular congestion with small pleural effusions.   Original Report Authenticated By: Christiana Pellant, M.D.   Dg Chest Port 1 View  01/01/2013   *RADIOLOGY REPORT*  Clinical Data: Hypoxia  PORTABLE CHEST - 1 VIEW  Comparison: December 31, 2012  Findings:  Endotracheal tube tip is 2.5 cm above the carina. Central catheters are unchanged in position. Nasogastric tube tip and side port are in the stomach region.  No apparent pneumothorax.  There is consolidation in the left lower lobe with small left effusion.  The right lung is clear.  Heart is borderline prominent with normal pulmonary vascularity.  No appreciable adenopathy.  IMPRESSION:  Tube and catheter positions as described.  No pneumothorax. There is consolidation in the left lower lobe with small left effusion.  Right lung clear.   Original Report Authenticated By: Bretta Bang, M.D.   Dg Chest Port 1 View  12/31/2012   *RADIOLOGY REPORT*  Clinical Data: Critically ill patient with possible change in position of one of the indwelling support apparatus, the Impella device.  PORTABLE CHEST - 1 VIEW 12/31/2012 1141 hours:  Comparison: Portable chest x-ray earlier same date 0608 hours.  Findings: The Impella device which has been placed from a femoral approach is unchanged in position since the examination earlier today, with the distal hook in the left ventricle and the device traversing the aortic valve.  Endotracheal tube tip somewhat low, chest approximately 2 cm above the carina, as noted previously. Remaining support apparatus satisfactory with the Swan-Ganz catheter tip overlying the proximal right main pulmonary artery, the right jugular central venous catheter tip overlying the lower SVC, the left subclavian central venous catheter tip projecting over the upper SVC, and the nasogastric tube looped in the stomach. External pacing pads.  Markedly suboptimal inspiration  with atelectasis in the lower lobes, right greater than left, unchanged. Pulmonary venous hypertension without overt edema, unchanged.  No new pulmonary parenchymal abnormalities.  IMPRESSION:  1.  Support apparatus satisfactory with the exception that the endotracheal tube tip is low, just about 2 cm above the carina. Specifically, no change in the position of the Impella device since earlier same date. 2.  Suboptimal inspiration with atelectasis in the lung bases, right greater than left, unchanged since earlier in the day. 3.  Pulmonary venous hypertension without overt edema, also unchanged. 4.  No new pulmonary parenchymal abnormalities.   Original Report Authenticated By: Hulan Saas, M.D.   Dg Chest Port 1 View  12/31/2012   *RADIOLOGY REPORT*  Clinical Data: Endotracheal tube placement  PORTABLE CHEST - 1 VIEW  Comparison: 12/30/2012  Findings: Endotracheal tube tip is 1.1 cm from the carina.  NG tube stable.  Swan-Ganz catheter from the abdomen is stable with its tip in the right hilum.  Right internal jugular dialysis catheter is stable.  Left subclavian central venous catheter is stable.  Low volumes, bilateral pleural effusions, and mild edema are not significantly changed.  No pneumothorax.  IMPRESSION: Stable pleural effusions and edema.   Original Report Authenticated By: Jolaine Click, M.D.   Dg Chest Port 1 View  12/30/2012   *RADIOLOGY REPORT*  Clinical Data: Catheter placement  PORTABLE CHEST - 1 VIEW  Comparison: Same date.  Findings: No change is noted in position of endotracheal or nasogastric tubes.  Left subclavian catheter is unchanged in position with tip in SVC.  There has been interval placement of right internal jugular dialysis catheter with distal tip in expected position of the SVC.  No pneumothorax is noted. Hypoinflation of the lungs is noted.  Central pulmonary vascular congestion is noted.  IMPRESSION: Status post placement of right internal jugular dialysis catheter with  tip in expected position of the SVC.  No pneumothorax is seen.   Original Report Authenticated By: Lupita Raider.,  M.D.   Dg Chest Port 1 View  12/30/2012   *RADIOLOGY REPORT*  Clinical Data: Endotracheal tube placement.  PORTABLE CHEST - 1 VIEW  Comparison: 12/29/2012.  Findings: Low volume chest.  The endotracheal tube tip is 24 mm from the carina.  Defibrillator pads over the chest.  Enteric tube is present with the tip not visualized.  Inferior presumed femoral approach Swan-Ganz catheter is present with the tip in the descending right pulmonary artery.  Progressive atelectasis/volume loss in the right middle and right lower lobes.  Prominent atelectasis associated with lower lung volumes.  Pulmonary vascular congestion is accentuated by the low volumes. Left subclavian central line remains present with the tip in the mid to lower SVC.  IMPRESSION:  1.  Stable support apparatus aside from the addition of what appears to be a femoral approach Swan-Ganz catheter with the tip in the descending right pulmonary artery. 2.  Lower lung volumes with increasing atelectasis, most prominent at the right base.   Original Report Authenticated By: Andreas Newport, M.D.   Dg Chest Portable 1 View  12/29/2012   *RADIOLOGY REPORT*  Clinical Data: Unresponsive  PORTABLE CHEST - 1 VIEW  Comparison: Chest radiograph 12/29/2012  Findings: Endotracheal tube is 3.5 cm from carina.  Left central venous line is unchanged.  Stable cardiac silhouette.  There is central venous congestion similar prior.  There is bibasilar atelectasis greater on the right not changed.  IMPRESSION:  1.  Stable support apparatus. 2.  No interval change.  3.  Low lung volumes, basilar atelectasis, and central venous congestion.   Original Report Authenticated By: Genevive Bi, M.D.   Dg Chest Port 1 View  12/29/2012   *RADIOLOGY REPORT*  Clinical Data: Evaluate endotracheal tube and left subclavian line placement  PORTABLE CHEST - 1 VIEW   Comparison: Earlier same day  Findings:  Grossly unchanged enlarged cardiac silhouette and mediastinal contours, possibly attributable to decreased lung volumes and AP projection.  An endotracheal tube overlies the tracheal air column with tip superior to the carina.  Interval placement  of a left subclavian vein approach central venous catheter with tip projecting over the superior cavoatrial junction.  Enteric tube tip and side port project below the left hemidiaphragm.  No pneumothorax.  Pulmonary vasculature is indistinct with cephalization of flow.  Small/trace bilateral effusions are suspected with minimal amount of fluid layering within the right minor fissure.  Worsening perihilar opacities.  Unchanged bones.  IMPRESSION: 1.  Appropriately positioned support apparatus as above.  No pneumothorax. 2.  Suspected pulmonary edema. 3.  Reduced lung volumes with worsening perihilar opacities, likely atelectasis.  findings suggestive of pulmonary edema   Original Report Authenticated By: Tacey Ruiz, MD   Dg Chest Portable 1 View  12/29/2012   *RADIOLOGY REPORT*  Clinical Data: Status post intubation  PORTABLE CHEST - 1 VIEW  Comparison: None.  Findings: An endotracheal tube and nasogastric catheter are noted. Nasogastric catheter appears coiled within the distal esophagus. The endotracheal tube is in satisfactory position.  The lungs are well-aerated with some mild right basilar atelectasis.   IMPRESSION: Tubes and lines as described above.  Mild right basilar atelectasis.   Original Report Authenticated By: Alcide Clever, M.D.   Dg Abd Portable 1v  12/30/2012   *RADIOLOGY REPORT*  Clinical Data: NG tube placement  PORTABLE ABDOMEN - 1 VIEW  Comparison: None.  Findings: The NG tube is curled in the fundus of the stomach.  The tip is in the body.  No disproportionate dilatation of bowel. Multiple linear devices project over the abdomen.  IMPRESSION: NG tube is coiled in the stomach   Original Report  Authenticated By: Jolaine Click, M.D.    Microbiology: No results found for this or any previous visit (from the past 240 hour(s)).   Labs: Basic Metabolic Panel:  Recent Labs Lab 01/20/13 0640 01/21/13 0545 01/22/13 0420 01/23/13 0714 01/24/13 0435  NA 139 137 140 140 141  K 4.9 5.3* 4.6 4.9 5.0  CL 100 99 100 101 101  CO2 28 25 28 28 26   GLUCOSE 82 80 81 86 86  BUN 40* 53* 40* 54* 60*  CREATININE 9.45* 11.70* 8.62* 11.09* 11.63*  CALCIUM 8.7 8.8 8.4 9.1 9.0  MG 2.4 2.6* 2.4 2.5 2.6*  PHOS 4.6 5.2* 4.9* 4.7* 5.0*   Liver Function Tests:  Recent Labs Lab 01/20/13 0640 01/21/13 0545 01/22/13 0420 01/23/13 0714 01/24/13 0435  AST 19 18 20 21 22   ALT 20 19 19 19 17   ALKPHOS 82 81 83 85 87  BILITOT 0.3 0.3 0.3 0.3 0.3  PROT 6.2 6.2 6.7 6.9 6.5  ALBUMIN 2.5* 2.6* 2.4* 2.7* 2.7*   No results found for this basename: LIPASE, AMYLASE,  in the last 168 hours No results found for this basename: AMMONIA,  in the last 168 hours CBC:  Recent Labs Lab 01/20/13 0640 01/21/13 0545 01/22/13 0420 01/23/13 0714 01/24/13 0435  WBC 10.5 10.3 9.4 9.0 8.8  NEUTROABS 6.1 5.7 4.8 4.7 5.0  HGB 8.7* 8.8* 9.1* 9.0* 9.0*  HCT 28.5* 28.0* 28.8* 28.7* 28.9*  MCV 99.0 98.2 98.3 98.3 98.3  PLT 327 330 327 340 349   Cardiac Enzymes: No results found for this basename: CKTOTAL, CKMB, CKMBINDEX, TROPONINI,  in the last 168 hours BNP: BNP (last 3 results)  Recent Labs  12/30/12 0500  PROBNP 3296.0*   CBG:  Recent Labs Lab 01/23/13 0728 01/23/13 1135 01/23/13 1624 01/23/13 2240 01/24/13 0804  GLUCAP 95 104* 91 84 76       Signed:  WOODS, CURTIS, J  [facility] 01/24/2013, 10:27 AM

## 2013-01-24 NOTE — Progress Notes (Signed)
Patient ID: Nicholas Potts, male   DOB: 1960/11/21, 52 y.o.   MRN: [id number] S:no new complaints O:BP 104/62  Pulse 74  Temp(Src) 99 F (37.2 C) (Oral)  Resp 20  Ht 6' (1.829 m)  Wt 111.6 kg (246 lb 0.5 oz)  BMI 33.36 kg/m2  SpO2 100%  Intake/Output Summary (Last 24 hours) at 01/24/13 4098 Last data filed at 01/24/13 0600  Gross per 24 hour  Intake    720 ml  Output   2475 ml  Net  -1755 ml   Intake/Output: I/O last 3 completed shifts: In: 960 [P.O.:960] Out: 3176 [Urine:3175; Stool:1]  Intake/Output this shift:    Weight change:  Gen:WD obese hispanic male in NAD CVS:no rub Resp:cta JXB:JYNWG, +BS, soft, NT NFA:OZHYQ pretib edema   Recent Labs Lab 01/18/13 0602 01/19/13 0435 01/20/13 0640 01/21/13 0545 01/22/13 0420 01/23/13 0714 01/24/13 0435  NA 135 134* 139 137 140 140 141  K 5.5* 5.4* 4.9 5.3* 4.6 4.9 5.0  CL 95* 94* 100 99 100 101 101  CO2 29 25 28 25 28 28 26   GLUCOSE 84 84 82 80 81 86 86  BUN 58* 74* 40* 53* 40* 54* 60*  CREATININE 11.18* 13.52* 9.45* 11.70* 8.62* 11.09* 11.63*  ALBUMIN 2.7* 2.7* 2.5* 2.6* 2.4* 2.7* 2.7*  CALCIUM 8.7 8.6 8.7 8.8 8.4 9.1 9.0  PHOS 6.2* 5.3* 4.6 5.2* 4.9* 4.7* 5.0*  AST  --  19 19 18 20 21 22   ALT  --  23 20 19 19 19 17    Liver Function Tests:  Recent Labs Lab 01/22/13 0420 01/23/13 0714 01/24/13 0435  AST 20 21 22   ALT 19 19 17   ALKPHOS 83 85 87  BILITOT 0.3 0.3 0.3  PROT 6.7 6.9 6.5  ALBUMIN 2.4* 2.7* 2.7*   No results found for this basename: LIPASE, AMYLASE,  in the last 168 hours No results found for this basename: AMMONIA,  in the last 168 hours CBC:  Recent Labs Lab 01/20/13 0640 01/21/13 0545 01/22/13 0420 01/23/13 0714 01/24/13 0435  WBC 10.5 10.3 9.4 9.0 8.8  NEUTROABS 6.1 5.7 4.8 4.7 5.0  HGB 8.7* 8.8* 9.1* 9.0* 9.0*  HCT 28.5* 28.0* 28.8* 28.7* 28.9*  MCV 99.0 98.2 98.3 98.3 98.3  PLT 327 330 327 340 349   Cardiac Enzymes: No results found for this basename: CKTOTAL, CKMB,  CKMBINDEX, TROPONINI,  in the last 168 hours CBG:  Recent Labs Lab 01/22/13 2120 01/23/13 0728 01/23/13 1135 01/23/13 1624 01/23/13 2240  GLUCAP 102* 95 104* 91 84    Iron Studies: No results found for this basename: IRON, TIBC, TRANSFERRIN, FERRITIN,  in the last 72 hours Studies/Results: No results found. Marland Kitchen antiseptic oral rinse  15 mL Mouth Rinse TID WC & HS  . aspirin  325 mg Oral Daily  . calcium acetate  1,334 mg Oral TID WC  . carvedilol  6.25 mg Per NG tube BID WC  . darbepoetin (ARANESP) injection - NON-DIALYSIS  150 mcg Subcutaneous Q Mon-1800  . heparin subcutaneous  5,000 Units Subcutaneous Q8H  . insulin aspart  0-9 Units Subcutaneous TID AC & HS  . ketoconazole   Topical BID  . lanthanum  1,000 mg Oral TID WC  . multivitamin with minerals  1 tablet Oral Daily  . pantoprazole  40 mg Oral Daily  . simvastatin  20 mg Oral q1800    BMET    Component Value Date/Time   NA 141 01/24/2013 0435   K  5.0 01/24/2013 0435   CL 101 01/24/2013 0435   CO2 26 01/24/2013 0435   GLUCOSE 86 01/24/2013 0435   BUN 60* 01/24/2013 0435   CREATININE 11.63* 01/24/2013 0435   CALCIUM 9.0 01/24/2013 0435   GFRNONAA 4* 01/24/2013 0435   GFRAA 5* 01/24/2013 0435   CBC    Component Value Date/Time   WBC 8.8 01/24/2013 0435   RBC 2.94* 01/24/2013 0435   HGB 9.0* 01/24/2013 0435   HCT 28.9* 01/24/2013 0435   PLT 349 01/24/2013 0435   MCV 98.3 01/24/2013 0435   MCH 30.6 01/24/2013 0435   MCHC 31.1 01/24/2013 0435   RDW 13.9 01/24/2013 0435   LYMPHSABS 1.5 01/24/2013 0435   MONOABS 1.1* 01/24/2013 0435   EOSABS 1.1* 01/24/2013 0435   BASOSABS 0.1 01/24/2013 0435     Assessment/Plan:  1. AKI- has been dialysis-dependent since 12/30/12 but now starting to show improvement of UOP. Rate of rise has significantly slowed and UOP has tripled.  Will plan to hold off on HD for now and follow UOP, Scr and K levels. Hopefully he can be discharged to SNF without outpt HD if he shows at least 2 consecutive  days of improvement in Scr and K. Will cont to follow closely.  Appreciate primary service's assistance with following patients recovery. 2. Cardiogenic shock- improved 3. Opiate overdose- ?accidental, resolved 4. ABLA/?ACDz- on aranesp 5. HTN 6. Hyperkalemia- improved post HD on 01/21/13 will follow 7. Vascular access- HD catheter in place, AVF on hold as his renal function appears to be improving and had  8. OSA- on CPAP 9. Dispo- hold off on SNF transfer as he may not require outpt HD, if labs improving, will have him f/u with Dr. Hyman Hopes as an outpt and remove PC. 10. Diarrhea-resolved. 11.   Weslynn Ke A

## 2013-01-24 NOTE — Progress Notes (Signed)
Pt places self on/off cpap. Pt informed to alert rn if he needed help and they will notify rt.

## 2013-01-25 DIAGNOSIS — T400X1A Poisoning by opium, accidental (unintentional), initial encounter: Secondary | ICD-10-CM

## 2013-01-25 LAB — CBC WITH DIFFERENTIAL/PLATELET
Basophils Relative: 4 % — ABNORMAL HIGH (ref 0–1)
Eosinophils Absolute: 0.6 10*3/uL (ref 0.0–0.7)
Hemoglobin: 9.2 g/dL — ABNORMAL LOW (ref 13.0–17.0)
Lymphs Abs: 2.1 10*3/uL (ref 0.7–4.0)
MCH: 30.6 pg (ref 26.0–34.0)
Monocytes Relative: 12 % (ref 3–12)
Neutro Abs: 4.1 10*3/uL (ref 1.7–7.7)
Neutrophils Relative %: 51 % (ref 43–77)
Platelets: 313 10*3/uL (ref 150–400)
RBC: 3.01 MIL/uL — ABNORMAL LOW (ref 4.22–5.81)

## 2013-01-25 LAB — COMPREHENSIVE METABOLIC PANEL
ALT: 18 U/L (ref 0–53)
Albumin: 2.8 g/dL — ABNORMAL LOW (ref 3.5–5.2)
Alkaline Phosphatase: 86 U/L (ref 39–117)
BUN: 66 mg/dL — ABNORMAL HIGH (ref 6–23)
Chloride: 102 mEq/L (ref 96–112)
Glucose, Bld: 78 mg/dL (ref 70–99)
Potassium: 4.7 mEq/L (ref 3.5–5.1)
Sodium: 141 mEq/L (ref 135–145)
Total Bilirubin: 0.3 mg/dL (ref 0.3–1.2)
Total Protein: 6.5 g/dL (ref 6.0–8.3)

## 2013-01-25 LAB — GLUCOSE, CAPILLARY
Glucose-Capillary: 101 mg/dL — ABNORMAL HIGH (ref 70–99)
Glucose-Capillary: 89 mg/dL (ref 70–99)

## 2013-01-25 NOTE — Progress Notes (Signed)
Physical Therapy Treatment Patient Details Name: Nicholas Potts MRN: [id number] DOB: [birth year] Today's Date: 01/25/2013 Time: 0949-1000 PT Time Calculation (min): 11 min  PT Assessment / Plan / Recommendation  History of Present Illness 52 year old male for evaluation of shock. The patient was intubated and had been coded 3 separate times. The patient had repair of his Achilles tendon 2 days prior to admission. He apparently had some difficulties with respiration postoperatively but otherwise did well. He has been taking pain medication as instructed based on his family's report. He was last seen lucid at 9 AM and at approximately 11:30 AM his family found him unresponsive and "white foam coming from his mouth". EMS was called and he was brought to the emergency room where he suffered cardiac arrest 3 separate times. There is note of PEA arrest.   PT Comments   Pt currently supervision level and d/c plans for SNF so will likely see once more to make sure family and pt agreeable to d/c acute PT, and if pt with change in d/c to home then practice stairs.  Follow Up Recommendations  SNF     Does the patient have the potential to tolerate intense rehabilitation     Barriers to Discharge        Equipment Recommendations  Rolling walker with 5" wheels    Recommendations for Other Services    Frequency     Progress towards PT Goals Progress towards PT goals: Progressing toward goals  Plan Current plan remains appropriate    Precautions / Restrictions Precautions Precautions: Fall Required Braces or Orthoses: Other Brace/Splint Other Brace/Splint: CAM boot with heel lift Restrictions Weight Bearing Restrictions: No RLE Weight Bearing: Weight bearing as tolerated Other Position/Activity Restrictions: CAM boot with heel lift for WBAT   Pertinent Vitals/Pain Reports minimal pain with ankle pumps/circles and ambulation    Mobility  Bed Mobility Bed Mobility: Sit to Supine Supine  to Sit: 6: Modified independent (Device/Increase time);With rails;HOB elevated Sitting - Scoot to Edge of Bed: 7: Independent Sit to Supine: 7: Independent Details for Bed Mobility Assistance: Needs increased time.  Required assist to doff CAM boot RLE, states still having trouble with dizziness when leaning forward Transfers Transfers: Sit to Stand;Stand to Sit Sit to Stand: 5: Supervision;From chair/3-in-1;With upper extremity assist Stand to Sit: 5: Supervision;To bed;With upper extremity assist Details for Transfer Assistance: verbal cues for hand placement Ambulation/Gait Ambulation/Gait Assistance: 5: Supervision Ambulation Distance (Feet): 60 Feet Assistive device: Rolling walker Ambulation/Gait Assistance Details: verbal cue for RW distance Gait Pattern: Trunk flexed;Decreased stance time - right;Step-through pattern    Exercises General Exercises - Lower Extremity Ankle Circles/Pumps: AROM;Right;20 reps;Supine   PT Diagnosis:    PT Problem List:   PT Treatment Interventions:     PT Goals (current goals can now be found in the care plan section)    Visit Information  Last PT Received On: 01/25/13 Assistance Needed: +1 History of Present Illness: 52 year old male for evaluation of shock. The patient was intubated and had been coded 3 separate times. The patient had repair of his Achilles tendon 2 days prior to admission. He apparently had some difficulties with respiration postoperatively but otherwise did well. He has been taking pain medication as instructed based on his family's report. He was last seen lucid at 9 AM and at approximately 11:30 AM his family found him unresponsive and "white foam coming from his mouth". EMS was called and he was brought to the emergency room where he suffered cardiac  arrest 3 separate times. There is note of PEA arrest.    Subjective Data      Cognition  Cognition Arousal/Alertness: Awake/alert Behavior During Therapy: WFL for tasks  assessed/performed Overall Cognitive Status: Within Functional Limits for tasks assessed    Balance  Balance Balance Assessed: Yes Static Sitting Balance Static Sitting - Balance Support: Feet supported;Bilateral upper extremity supported Static Sitting - Level of Assistance: 6: Modified independent (Device/Increase time) Static Standing Balance Static Standing - Balance Support: No upper extremity supported;During functional activity Static Standing - Level of Assistance: 5: Stand by assistance  End of Session PT - End of Session Equipment Utilized During Treatment: Other (comment) (CAM boot) Activity Tolerance: Patient tolerated treatment well Patient left: with call bell/phone within reach;with family/visitor present;in bed   GP     Octavius Shin,KATHrine E 01/25/2013, 11:40 AM Zenovia Jarred, PT, DPT 01/25/2013 Pager: [phone]

## 2013-01-25 NOTE — Progress Notes (Signed)
Occupational Therapy Treatment Patient Details Name: Nicholas Potts MRN: [id number] DOB: May 17, 1961 Today's Date: 01/25/2013 Time: 4098-1191 OT Time Calculation (min): 29 min  OT Assessment / Plan / Recommendation  History of present illness 52 year old male for evaluation of shock. The patient was intubated and had been coded 3 separate times. The patient had repair of his Achilles tendon 2 days prior to admission. He apparently had some difficulties with respiration postoperatively but otherwise did well. He has been taking pain medication as instructed based on his family's report. He was last seen lucid at 9 AM and at approximately 11:30 AM his family found him unresponsive and "white foam coming from his mouth". EMS was called and he was brought to the emergency room where he suffered cardiac arrest 3 separate times. There is note of PEA arrest.   OT comments  No C/O dizziness or pain. Pt performed ADL tasks in standing at sink as well as seated x1 for rest break. Min assist for LE dressing & to don/doff CAM boot, provided pt/family education on don/doff, pt wife verbalized understanding.   Follow Up Recommendations  SNF    Barriers to Discharge       Equipment Recommendations  None recommended by OT    Recommendations for Other Services    Frequency Min 2X/week   Progress towards OT Goals Progress towards OT goals: Progressing toward goals  Plan Discharge plan remains appropriate    Precautions / Restrictions Precautions Precautions: Fall Required Braces or Orthoses: Other Brace/Splint Other Brace/Splint: CAM boot with heel lift Restrictions Weight Bearing Restrictions: No RLE Weight Bearing: Weight bearing as tolerated Other Position/Activity Restrictions: CAM boot with heel lift for WBAT   Pertinent Vitals/Pain Denies pain, no c/o.    ADL  Eating/Feeding: Performed;Independent Where Assessed - Eating/Feeding: Chair Grooming: Performed;Wash/dry hands;Wash/dry  face;Teeth care;Brushing hair;Supervision/safety Where Assessed - Grooming: Unsupported standing Upper Body Bathing: Performed;Supervision/safety;Set up Where Assessed - Upper Body Bathing: Unsupported standing;Unsupported sit to stand Lower Body Bathing: Performed;Minimal assistance Where Assessed - Lower Body Bathing: Unsupported standing;Unsupported sit to stand Upper Body Dressing: Performed;Supervision/safety (Due to IV/lines only) Where Assessed - Upper Body Dressing: Unsupported standing Lower Body Dressing: Performed;Minimal assistance Where Assessed - Lower Body Dressing: Unsupported sitting;Unsupported sit to stand Toilet Transfer: Buyer, retail Method: Sit to Barista: Regular height toilet;Grab bars Toileting - Clothing Manipulation and Hygiene: Supervision/safety;Min guard Where Assessed - Engineer, mining and Hygiene: Sit to stand from 3-in-1 or toilet Tub/Shower Transfer Method: Not assessed Equipment Used: Rolling walker;Gait belt Transfers/Ambulation Related to ADLs: Overall supervision level assist for functional mobility, transfers w/ RW & CAM boot right LE ADL Comments: No C/O dizziness or pain. Pt performed ADL tasks in standing at sink as well as seated x1 for rest break. Min assist for LE dressing & to don/doff CAM boot, provided pt/family education on don/doff, pt wife verbalized understanding.    OT Diagnosis:    OT Problem List:   OT Treatment Interventions:     OT Goals(current goals can now be found in the care plan section)    Visit Information  Last OT Received On: 01/25/13 Assistance Needed: +1 History of Present Illness: 52 year old male for evaluation of shock. The patient was intubated and had been coded 3 separate times. The patient had repair of his Achilles tendon 2 days prior to admission. He apparently had some difficulties with respiration postoperatively but otherwise did well.  He has been taking pain medication as instructed based on his family's  report. He was last seen lucid at 9 AM and at approximately 11:30 AM his family found him unresponsive and "white foam coming from his mouth". EMS was called and he was brought to the emergency room where he suffered cardiac arrest 3 separate times. There is note of PEA arrest.    Subjective Data      Prior Functioning       Cognition  Cognition Arousal/Alertness: Awake/alert Behavior During Therapy: WFL for tasks assessed/performed Overall Cognitive Status: Within Functional Limits for tasks assessed    Mobility  Bed Mobility Bed Mobility: Supine to Sit;Sitting - Scoot to Edge of Bed Supine to Sit: 6: Modified independent (Device/Increase time);With rails;HOB elevated Sitting - Scoot to Edge of Bed: 7: Independent Details for Bed Mobility Assistance: Needs increased time.  Required assist to don/doff CAM boot RLE Transfers Transfers: Sit to Stand;Stand to Sit Sit to Stand: 5: Supervision;From bed;From chair/3-in-1;4: Min guard Stand to Sit: 5: Supervision;With armrests;To chair/3-in-1 Details for Transfer Assistance: verbal cues for hand placement         Balance Balance Balance Assessed: Yes Static Sitting Balance Static Sitting - Balance Support: Feet supported;Bilateral upper extremity supported Static Sitting - Level of Assistance: 6: Modified independent (Device/Increase time) Static Standing Balance Static Standing - Balance Support: No upper extremity supported;During functional activity Static Standing - Level of Assistance: 5: Stand by assistance   End of Session OT - End of Session Equipment Utilized During Treatment: Rolling walker;Gait belt Activity Tolerance: Patient tolerated treatment well Patient left: in chair;with call bell/phone within reach Nurse Communication: Mobility status;Other (comment) (Pt performed ADL's at sink level)  GO     Charletta Cousin, Amy Beth Dixon 01/25/2013, 10:27  AM

## 2013-01-25 NOTE — Progress Notes (Signed)
Patient ID: Nicholas Potts, male   DOB: June 28, 1960, 52 y.o.   MRN: [id number] S:no complaints O:BP 116/73  Pulse 72  Temp(Src) 98.5 F (36.9 C) (Oral)  Resp 20  Ht 6' (1.829 m)  Wt 110.4 kg (243 lb 6.2 oz)  BMI 33 kg/m2  SpO2 100%  Intake/Output Summary (Last 24 hours) at 01/25/13 1020 Last data filed at 01/25/13 0152  Gross per 24 hour  Intake    795 ml  Output   2775 ml  Net  -1980 ml   Intake/Output: I/O last 3 completed shifts: In: 1035 [P.O.:720; I.V.:240; Other:75] Out: 4475 [Urine:4475]  Intake/Output this shift:    Weight change: -1.2 kg (-2 lb 10.3 oz) Gen:WD WN in NAD CVS:no rub Resp:cta FAO:ZHYQMV Ext:tr edema   Recent Labs Lab 01/19/13 0435 01/20/13 0640 01/21/13 0545 01/22/13 0420 01/23/13 0714 01/24/13 0435 01/25/13 0500  NA 134* 139 137 140 140 141 141  K 5.4* 4.9 5.3* 4.6 4.9 5.0 4.7  CL 94* 100 99 100 101 101 102  CO2 25 28 25 28 28 26 25   GLUCOSE 84 82 80 81 86 86 78  BUN 74* 40* 53* 40* 54* 60* 66*  CREATININE 13.52* 9.45* 11.70* 8.62* 11.09* 11.63* 11.56*  ALBUMIN 2.7* 2.5* 2.6* 2.4* 2.7* 2.7* 2.8*  CALCIUM 8.6 8.7 8.8 8.4 9.1 9.0 9.2  PHOS 5.3* 4.6 5.2* 4.9* 4.7* 5.0*  --   AST 19 19 18 20 21 22 18   ALT 23 20 19 19 19 17 18    Liver Function Tests:  Recent Labs Lab 01/23/13 0714 01/24/13 0435 01/25/13 0500  AST 21 22 18   ALT 19 17 18   ALKPHOS 85 87 86  BILITOT 0.3 0.3 0.3  PROT 6.9 6.5 6.5  ALBUMIN 2.7* 2.7* 2.8*   No results found for this basename: LIPASE, AMYLASE,  in the last 168 hours No results found for this basename: AMMONIA,  in the last 168 hours CBC:  Recent Labs Lab 01/21/13 0545 01/22/13 0420 01/23/13 0714 01/24/13 0435 01/25/13 0500  WBC 10.3 9.4 9.0 8.8 8.1  NEUTROABS 5.7 4.8 4.7 5.0 4.1  HGB 8.8* 9.1* 9.0* 9.0* 9.2*  HCT 28.0* 28.8* 28.7* 28.9* 29.6*  MCV 98.2 98.3 98.3 98.3 98.3  PLT 330 327 340 349 313   Cardiac Enzymes: No results found for this basename: CKTOTAL, CKMB, CKMBINDEX,  TROPONINI,  in the last 168 hours CBG:  Recent Labs Lab 01/24/13 0804 01/24/13 1225 01/24/13 1716 01/24/13 2117 01/25/13 0748  GLUCAP 76 77 95 88 78    Iron Studies: No results found for this basename: IRON, TIBC, TRANSFERRIN, FERRITIN,  in the last 72 hours Studies/Results: No results found. Marland Kitchen antiseptic oral rinse  15 mL Mouth Rinse TID WC & HS  . aspirin  325 mg Oral Daily  . calcium acetate  1,334 mg Oral TID WC  . carvedilol  6.25 mg Per NG tube BID WC  . darbepoetin (ARANESP) injection - NON-DIALYSIS  150 mcg Subcutaneous Q Mon-1800  . heparin subcutaneous  5,000 Units Subcutaneous Q8H  . insulin aspart  0-9 Units Subcutaneous TID AC & HS  . ketoconazole   Topical BID  . lanthanum  1,000 mg Oral TID WC  . multivitamin with minerals  1 tablet Oral Daily  . pantoprazole  40 mg Oral Daily  . simvastatin  20 mg Oral q1800    BMET    Component Value Date/Time   NA 141 01/25/2013 0500   K 4.7 01/25/2013 0500  CL 102 01/25/2013 0500   CO2 25 01/25/2013 0500   GLUCOSE 78 01/25/2013 0500   BUN 66* 01/25/2013 0500   CREATININE 11.56* 01/25/2013 0500   CALCIUM 9.2 01/25/2013 0500   GFRNONAA 4* 01/25/2013 0500   GFRAA 5* 01/25/2013 0500   CBC    Component Value Date/Time   WBC 8.1 01/25/2013 0500   RBC 3.01* 01/25/2013 0500   HGB 9.2* 01/25/2013 0500   HCT 29.6* 01/25/2013 0500   PLT 313 01/25/2013 0500   MCV 98.3 01/25/2013 0500   MCH 30.6 01/25/2013 0500   MCHC 31.1 01/25/2013 0500   RDW 13.9 01/25/2013 0500   LYMPHSABS 2.1 01/25/2013 0500   MONOABS 1.0 01/25/2013 0500   EOSABS 0.6 01/25/2013 0500   BASOSABS 0.3* 01/25/2013 0500     Assessment/Plan:  1. AKI- has been dialysis-dependent since 12/30/12 but now starting to show improvement of UOP. Rate of rise has significantly slowed and UOP has tripled. Finally some improvement in Scr.  Will plan to hold off on HD for now and follow UOP, Scr and K levels. Hopefully he can be discharged to SNF without outpt HD if he shows at least  2 consecutive days of improvement in Scr and K. Will cont to follow closely. Appreciate primary service's assistance with following patients recovery. 2. Cardiogenic shock- improved 3. Opiate overdose- ?accidental, resolved 4. ABLA/?ACDz- on aranesp 5. HTN 6. Hyperkalemia- improved post HD on 01/21/13 will follow 7. Vascular access- HD catheter in place, AVF on hold as his renal function appears to be improving and had  8. OSA- on CPAP 9. Dispo- hold off on SNF transfer as he may not require outpt HD, if labs improving, will have him f/u with Dr. Hyman Hopes as an outpt and remove PC.  Tirsa Gail A

## 2013-01-25 NOTE — Progress Notes (Signed)
[facility] PROGRESS NOTE  MANU RUBEY ZOX:[id number] DOB: Jun 09, 1960 DOA: 12/29/2012 PCP: No primary provider on file.  Assessment/Plan: Cardiogenic shock/acute CHF NYHA class III; resolved, echocardiogram from 01/11/2013 shows grade 1 diastolic dysfunction.  HTN; within [facility] guidelines no change the meds require Acute renal failure/ATN; was on hemodialysis T/TH/Sat; per Dr Hyman Hopes (Nephrologist) I would prepare patient for ESRD management. Per Dr. Hyman Hopes patient's renal function is improving, and request that patient be kept for another week in order to determine final status of patients renal function. Also allow dialysis PRN for Electrolyte imbalances  Accidental opiate poisoning; stable  Tinea Pedis; continue Ketoconazole and Pumice stone for Lt Foot, Hydroxyazine for pruritis.  OSA; patient doing well on hospital supplied CPAP, will require outpatient sleep study in order to reestablish his baseline.  --Counseled patient that he must wear his CPAP at night and that if he takes it off to go to the bathroom the nurses can help him readjust the mask (this was translated through his daughters)  Deconditioning; daughters helped patient walk the hallway in his walking boot and with his walker  Code Status: Full Family Communication: Pt in room (indicate person spoken with, relationship, and if by phone, the number) Disposition Plan: Pending  Consultants:  Nephrology  Vascular Surgery  Orthopedic surgery  Cardiology  Critical care  Procedures: Echocardiogram 01/11/2013 - Left ventricle: The cavity size was normal. Wall thickness was normal. Systolic function was normal. LVEF= 55% to 60%.  Doppler parameters are consistent with abnormal left ventricular relaxation (grade 1 diastolic dysfunction). - Aortic valve: There was no stenosis. - Mitral valve: No significant regurgitation. - Left atrium: The atrium was mildly dilated. - Right ventricle: The cavity size was normal.  Systolic function was normal.  HPI/Subjective: No acute events noted overnight  Objective: Filed Vitals:   01/24/13 1700 01/24/13 2114 01/25/13 0619 01/25/13 0730  BP: 120/69 126/76 109/68 116/73  Pulse: 88 71 66 72  Temp: 98.4 F (36.9 C) 98.7 F (37.1 C) 98.5 F (36.9 C) 98.5 F (36.9 C)  TempSrc: Oral Oral Oral Oral  Resp: 20 20 20 20   Height:      Weight:  110.4 kg (243 lb 6.2 oz)    SpO2: 100% 98% 100% 100%    Intake/Output Summary (Last 24 hours) at 01/25/13 1129 Last data filed at 01/25/13 0152  Gross per 24 hour  Intake    795 ml  Output   2775 ml  Net  -1980 ml   Filed Weights   01/21/13 0900 01/23/13 2236 01/24/13 2114  Weight: 114 kg (251 lb 5.2 oz) 111.6 kg (246 lb 0.5 oz) 110.4 kg (243 lb 6.2 oz)    Exam:   General:  Awake, in nad  Cardiovascular: regular, s1, s2  Respiratory: normal resp effort, no wheezing  Abdomen: soft, nondistended  Musculoskeletal: perfused, no clubbing   Data Reviewed: Basic Metabolic Panel:  Recent Labs Lab 01/20/13 0640 01/21/13 0545 01/22/13 0420 01/23/13 0714 01/24/13 0435 01/25/13 0500  NA 139 137 140 140 141 141  K 4.9 5.3* 4.6 4.9 5.0 4.7  CL 100 99 100 101 101 102  CO2 28 25 28 28 26 25   GLUCOSE 82 80 81 86 86 78  BUN 40* 53* 40* 54* 60* 66*  CREATININE 9.45* 11.70* 8.62* 11.09* 11.63* 11.56*  CALCIUM 8.7 8.8 8.4 9.1 9.0 9.2  MG 2.4 2.6* 2.4 2.5 2.6* 2.6*  PHOS 4.6 5.2* 4.9* 4.7* 5.0*  --    Liver Function Tests:  Recent Labs Lab 01/21/13 0545 01/22/13 0420 01/23/13 0714 01/24/13 0435 01/25/13 0500  AST 18 20 21 22 18   ALT 19 19 19 17 18   ALKPHOS 81 83 85 87 86  BILITOT 0.3 0.3 0.3 0.3 0.3  PROT 6.2 6.7 6.9 6.5 6.5  ALBUMIN 2.6* 2.4* 2.7* 2.7* 2.8*   No results found for this basename: LIPASE, AMYLASE,  in the last 168 hours No results found for this basename: AMMONIA,  in the last 168 hours CBC:  Recent Labs Lab 01/21/13 0545 01/22/13 0420 01/23/13 0714 01/24/13 0435  01/25/13 0500  WBC 10.3 9.4 9.0 8.8 8.1  NEUTROABS 5.7 4.8 4.7 5.0 4.1  HGB 8.8* 9.1* 9.0* 9.0* 9.2*  HCT 28.0* 28.8* 28.7* 28.9* 29.6*  MCV 98.2 98.3 98.3 98.3 98.3  PLT 330 327 340 349 313   Cardiac Enzymes: No results found for this basename: CKTOTAL, CKMB, CKMBINDEX, TROPONINI,  in the last 168 hours BNP (last 3 results)  Recent Labs  12/30/12 0500  PROBNP 3296.0*   CBG:  Recent Labs Lab 01/24/13 0804 01/24/13 1225 01/24/13 1716 01/24/13 2117 01/25/13 0748  GLUCAP 76 77 95 88 78    No results found for this or any previous visit (from the past 240 hour(s)).   Studies: No results found.  Scheduled Meds: . antiseptic oral rinse  15 mL Mouth Rinse TID WC & HS  . aspirin  325 mg Oral Daily  . calcium acetate  1,334 mg Oral TID WC  . carvedilol  6.25 mg Per NG tube BID WC  . darbepoetin (ARANESP) injection - NON-DIALYSIS  150 mcg Subcutaneous Q Mon-1800  . heparin subcutaneous  5,000 Units Subcutaneous Q8H  . insulin aspart  0-9 Units Subcutaneous TID AC & HS  . ketoconazole   Topical BID  . lanthanum  1,000 mg Oral TID WC  . multivitamin with minerals  1 tablet Oral Daily  . pantoprazole  40 mg Oral Daily  . simvastatin  20 mg Oral q1800   Continuous Infusions: . sodium chloride Stopped (01/01/13 1400)  . sodium chloride Stopped (12/31/12 2300)    Principal Problem:   Acute respiratory failure Active Problems:   Cardiogenic shock   Acute renal failure (ARF)   Cardiac and respiratory arrest with VT due to:   Aspiration pneumonia   Accidental opiate poisoning   ARF (acute renal failure) with tubular necrosis requiring HD   Acute systolic congestive heart failure, NYHA class 3   Anemia   Thrombocytopenia, unspecified   HTN (hypertension)   Tinea pedis   OSA on CPAP   Physical deconditioning  Time spent:  Osric Klopf K  [facility] Pager [phone]. If 7PM-7AM, please contact night-coverage at [email or web address], password [facility] 01/25/2013,  11:29 AM  LOS: 27 days

## 2013-01-26 ENCOUNTER — Telehealth: Payer: Self-pay | Admitting: Internal Medicine

## 2013-01-26 DIAGNOSIS — N170 Acute kidney failure with tubular necrosis: Secondary | ICD-10-CM

## 2013-01-26 LAB — COMPREHENSIVE METABOLIC PANEL
BUN: 67 mg/dL — ABNORMAL HIGH (ref 6–23)
CO2: 25 mEq/L (ref 19–32)
Calcium: 9.2 mg/dL (ref 8.4–10.5)
Chloride: 103 mEq/L (ref 96–112)
Creatinine, Ser: 10.04 mg/dL — ABNORMAL HIGH (ref 0.50–1.35)
GFR calc Af Amer: 6 mL/min — ABNORMAL LOW (ref >90)
GFR calc non Af Amer: 5 mL/min — ABNORMAL LOW (ref >90)
Glucose, Bld: 83 mg/dL (ref 70–99)
Total Bilirubin: 0.3 mg/dL (ref 0.3–1.2)

## 2013-01-26 LAB — CBC WITH DIFFERENTIAL/PLATELET
Eosinophils Relative: 5 % (ref 0–5)
HCT: 30.1 % — ABNORMAL LOW (ref 39.0–52.0)
Lymphs Abs: 2.1 10*3/uL (ref 0.7–4.0)
MCH: 30.5 pg (ref 26.0–34.0)
MCV: 97.7 fL (ref 78.0–100.0)
Monocytes Absolute: 0.9 10*3/uL (ref 0.1–1.0)
Monocytes Relative: 11 % (ref 3–12)
Neutro Abs: 4.6 10*3/uL (ref 1.7–7.7)
RBC: 3.08 MIL/uL — ABNORMAL LOW (ref 4.22–5.81)
WBC: 8.2 10*3/uL (ref 4.0–10.5)

## 2013-01-26 LAB — PHOSPHORUS: Phosphorus: 5.2 mg/dL — ABNORMAL HIGH (ref 2.3–4.6)

## 2013-01-26 LAB — MAGNESIUM: Magnesium: 2.6 mg/dL — ABNORMAL HIGH (ref 1.5–2.5)

## 2013-01-26 NOTE — Progress Notes (Signed)
Pt will place mask on when ready. Pt and family encouraged to call RT if any assistance is needed. No distress noted.

## 2013-01-26 NOTE — Progress Notes (Signed)
Pt had placed himself on CPAP when I entered room. Pt resting comfortably with family at bedside.

## 2013-01-26 NOTE — Discharge Summary (Signed)
Physician Discharge Summary  Nicholas Potts ZOX:[id number] DOB: 12-22-1960 DOA: 12/29/2012  PCP: No primary provider on file.  Admit date: 12/29/2012 Discharge date: 01/27/2013  Time spent: 30 minutes  Recommendations for Outpatient Follow-up:   1. Pt reminded to wear CPAP nightly 2. Follow up with Dr. Hyman Hopes as scheduled at 02/24/13 at 11am at [facility] 3. Weekly BMP and close monitoring of his labs through the SNF   Discharge Diagnoses:  Principal Problem:   Acute respiratory failure Active Problems:   Cardiogenic shock   Acute renal failure (ARF)   Cardiac and respiratory arrest with VT due to:   Aspiration pneumonia   Accidental opiate poisoning   ARF (acute renal failure) with tubular necrosis requiring HD   Acute systolic congestive heart failure, NYHA class 3   Anemia   Thrombocytopenia, unspecified   HTN (hypertension)   Tinea pedis   OSA on CPAP   Physical deconditioning   Discharge Condition: Improved  Diet recommendation: Renal  Filed Weights   01/24/13 2114 01/25/13 2139 01/26/13 2201  Weight: 110.4 kg (243 lb 6.2 oz) 110 kg (242 lb 8.1 oz) 108.6 kg (239 lb 6.7 oz)    History of present illness:  52 year old hispanic male with recent right Achilles tendon reconstruction was brought in for apnea due to unintentional narcotic overdose. Patient was found to have respiratory acidosis and hypothermia requiring intubation in the ER. Subsequent course complicated by hypotension, profound bradycardia and became pulseless requiring CPR. Patient then went into V. tach requiring defibrillation and amiodarone. He was stabilized and transferred to ICU.  Hospital Course:  Emergent echo done showed severe wall motion abnormality. He subsequently coded 2 more times and taken to cath lab immediately. Patient found to have severe cardiogenic shock and severely in the dysfunction with EF of 20%. Patient was placed on the impella dynamic support system along  with pressors . Patient also developed oliguric renal failure related to shock for which a hemodialysis catheter was placed and CVVHD initiated. Patient had severe encephalopathy secondary to acute insult. Neurology was consulted and had an EEG done which showed moderate to severe generalized nonspecific slowing. Patient's mental status slowly started to improve and after successful extubation he was transferred to step down. Cardiomyopathy has resolved. ARF initially was not resolving and the patient remained oliguric. The plan was for an AVF to be placed the following week by Vascular Surgery and the patient to start regular dialysis at that time. However, the patient's urine output began to improve. Plans for scheduled dialysis were put on hold as the patient's urine output was monitored. The patient's serum creatinine continued to slowly improve along with urine output. By the day of discharge, the patient was considered safe and stable for outpatient follow up without requiring further dialysis. The patient is to follow up with Dr. Hyman Hopes as an outpatient with weekly BMP to be drawn at the SNF.   Procedures: Echocardiogram 01/11/2013 - Left ventricle: The cavity size was normal. Wall thickness was normal. Systolic function was normal. LVEF= 55% to 60%.  Doppler parameters are consistent with abnormal left ventricular relaxation (grade 1 diastolic dysfunction). - Aortic valve: There was no stenosis. - Mitral valve: No significant regurgitation. - Left atrium: The atrium was mildly dilated. - Right ventricle: The cavity size was normal. Systolic function was normal.  Consultations: Nephrology  Vascular Surgery  Orthopedic surgery  Cardiology  Critical care  Discharge Exam: Filed Vitals:   01/26/13 1736 01/26/13 2201 01/27/13 8119 01/27/13 1478  BP: 114/87 130/74 127/71 127/76  Pulse: 65 72 70 73  Temp: 97.4 F (36.3 C) 98.8 F (37.1 C) 97.9 F (36.6 C) 98.8 F (37.1 C)  TempSrc: Oral  Oral Oral Oral  Resp: 17 18 20 20   Height:      Weight:  108.6 kg (239 lb 6.7 oz)    SpO2: 100% 99% 99% 100%    General: Awake, in nad Cardiovascular: regular, s1, s2 Respiratory: normal resp effort, no wheezing  Discharge Instructions     Medication List         aspirin 325 MG tablet  Take 325 mg by mouth daily.     calcium acetate 667 MG capsule  Commonly known as:  PHOSLO  Take 2 capsules (1,334 mg total) by mouth 3 (three) times daily with meals.     carvedilol 6.25 MG tablet  Commonly known as:  COREG  Take 1 tablet (6.25 mg total) by mouth 2 (two) times daily with a meal.     hydrOXYzine 25 MG tablet  Commonly known as:  ATARAX/VISTARIL  Take 1 tablet (25 mg total) by mouth 3 (three) times daily as needed for itching.     ketoconazole 2 % cream  Commonly known as:  NIZORAL  Apply topically 2 (two) times daily.     lanthanum 1000 MG chewable tablet  Commonly known as:  FOSRENOL  Chew 1 tablet (1,000 mg total) by mouth 3 (three) times daily with meals.     meloxicam 15 MG tablet  Commonly known as:  MOBIC  Take 15 mg by mouth daily.     ONE-A-DAY 50 PLUS Tabs  Take 1 tablet by mouth daily.     oxycodone 5 MG capsule  Commonly known as:  OXY-IR  Take 10 mg by mouth every 4 (four) hours as needed for pain.     simvastatin 20 MG tablet  Commonly known as:  ZOCOR  Take 20 mg by mouth daily.     traMADol 50 MG tablet  Commonly known as:  ULTRAM  Take 50 mg by mouth daily.       No Known Allergies Follow-up Information   Follow up with HEWITT, Jonny Ruiz, MD. Schedule an appointment as soon as possible for a visit in 2 weeks.   Specialty:  Orthopedic Surgery   Contact information:   [address] [place] [address] [phone]       Follow up with Garnetta Buddy, MD. (as scheduled)    Specialty:  Nephrology   Contact information:   [address] [place] [address] [phone]        The results of significant diagnostics  from this hospitalization (including imaging, microbiology, ancillary and laboratory) are listed below for reference.    Significant Diagnostic Studies: Ct Head Wo Contrast  12/29/2012   *RADIOLOGY REPORT*  Clinical Data: Altered mental status.  Possible overdose.  CT HEAD WITHOUT CONTRAST  Technique:  Contiguous axial images were obtained from the base of the skull through the vertex without contrast.  Comparison: Head CT 12/10/2007.  Findings: There is no evidence of acute intracranial hemorrhage, mass lesion, brain edema or extra-axial fluid collection.  The ventricles and subarachnoid spaces are appropriately sized for age. There is no CT evidence of acute cortical infarction.  There is diffuse mucosal thickening throughout the nasal passages, ethmoid and maxillary sinuses.  The nasopharyngeal airway is closed at the time of imaging (the patient is intubated).  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  The mastoids and middle ears are clear. The calvarium is intact.  IMPRESSION:  1.  No acute intracranial or calvarial findings identified. 2.  Diffuse mucosal thickening throughout the visualized nasal passages and paranasal sinuses. Significant maxillary periodontal disease bilaterally.   Original Report Authenticated By: Carey Bullocks, M.D.   Dg Chest Port 1 View  01/11/2013   *RADIOLOGY REPORT*  Clinical Data: Shortness of breath.  Pneumonia.  PORTABLE CHEST - 1 VIEW  Comparison: Plain film chest 01/08/2013 and 01/09/2013.  Findings: Dialysis catheter remains in place.  Lung volumes are low.  Mild left basilar atelectasis is identified.  There is no pulmonary edema.  No pneumothorax or pleural fluid is identified. Heart size is enlarged.  IMPRESSION:  Mild subsegmental atelectasis left lung base.  No acute abnormality.   Original Report Authenticated By: Holley Dexter, M.D.   Dg Chest Port 1 View  01/09/2013   *RADIOLOGY REPORT*  Clinical Data: Central catheter placement   PORTABLE CHEST - 1 VIEW  Comparison: Study obtained earlier in the day  Findings: Dual lumen catheter tip is at the cavoatrial junction. Left-sided catheter tip is in the right atrium.  No pneumothorax.  There is mild atelectasis in the left base.  Elsewhere, lungs are clear.  Heart is upper normal in size with normal pulmonary vascularity.  No adenopathy.  IMPRESSION: Catheters as described without pneumothorax.  Mild left base atelectasis.   Original Report Authenticated By: Bretta Bang, M.D.   Dg Chest Port 1 View  01/09/2013   *RADIOLOGY REPORT*  Clinical Data: Follow up pneumonia  PORTABLE CHEST - 1 VIEW  Comparison: Prior radiograph from 01/08/2013  Findings: The the patient has been extubated in the interim.  Right IJ Cordis catheter is stable in position.  Enteric tube is been removed.  Cardiac silhouette is unchanged.  The lungs remain hypoinflated.  There is been interval improvement in aeration of both lung bases with improved bibasilar opacities. Veiling opacity overlying the costophrenic angles are consistent with small pleural effusions, slightly larger on the left.  Osseous structures are unchanged.  IMPRESSION: 1.  Interval improvement in aeration of both lung bases with improved bibasilar opacities status post extubation. Particularly, dense opacity at the left lung base is improved, which may represent improved atelectasis, pleural fluid,  and / or infiltrate. 2.  Small bilateral pleural effusions, left greater than right.   Original Report Authenticated By: Rise Mu, M.D.   Dg Chest Port 1 View  01/08/2013   *RADIOLOGY REPORT*  Clinical Data: Follow up pneumonia  PORTABLE CHEST - 1 VIEW  Comparison: Prior chest x-ray 01/07/2013  Findings: The endotracheal tube is 3.5 cm above the carina.  Left subclavian approach central venous catheter with the tip at the superior cavoatrial junction.  The right IJ approach temporary hemodialysis catheter is noted with the tip in the upper  SVC. Visualized nasogastric tube in unchanged position.  Cardiac and mediastinal contours are unchanged.  Inspiratory volumes remain very low and there is left basilar atelectasis versus infiltrate. Small right pleural effusion is improving and there is improved aeration in the right base.  IMPRESSION:  1.  Stable and satisfactory support apparatus. 2.  Similar appearance of dense left basilar opacity which may represent a combination of pleural fluid with atelectasis and / or infiltrate. 3.  Improved aeration in the right base with decreasing pleural fluid and opacity.   Original Report Authenticated By: Malachy Moan, M.D.   Dg Chest Port 1 View  01/07/2013   *RADIOLOGY REPORT*  Clinical Data: Assess infiltrate  PORTABLE CHEST - 1 VIEW  Comparison: Prior radiograph from 01/06/2013  Findings: The the patient remains intubated with the tip of the endotracheal tube located 2.9 cm above the carina.  Enteric tube courses into the abdomen, and appears coiled within the gastric fundus, similar to prior. Right IJ central venous catheter is unchanged.  Left subclavian central venous catheter is in stable position with tip at cavoatrial junction.  Heart size is unchanged.  The lungs remain hypoinflated.  Bilateral layering pleural effusions persist, likely capping the right lung apex.  Associated bibasilar airspace opacities are also not significantly changed, which may read represent atelectasis or infiltrate.  No overt pulmonary edema.  No pneumothorax.  The bony structures are intact.  IMPRESSION: 1.  Support apparatus as above.  Tip of endotracheal tube 2.9 cm above the carina. 2.  Similar appearance of the chest with persistent hypoinflation with bilateral pleural effusions and associated bibasilar airspace opacities, which may reflect atelectasis or infiltrate.   Original Report Authenticated By: Rise Mu, M.D.   Dg Chest Port 1 View  01/06/2013   *RADIOLOGY REPORT*  Clinical Data: Pleural effusions.   PORTABLE CHEST - 1 VIEW  Comparison: 01/05/2013.  Findings: Endotracheal tube ends 2 cm above the carina.  Gastric suction tube is coiled in the stomach.  The right IJ sheath has been retracted slightly, still in good position at the level of the upper SVC. Left subclavian central venous catheter, tip at the level of the superior cavoatrial junction.  Unchanged heart size and mediastinal contours, distorted by low lung volumes.  Layering bilateral pleural effusions, likely capping the apical lungs.  There is likely also a lung opacity at the left base. No evident pneumothorax.  IMPRESSION:  1.  Satisfactory positioning of tubes and lines.  Please note that the right IJ catheter has been retracted since yesterday. 2. Low volume lungs with bilateral pleural effusions and left base atelectasis or infiltrate.   Original Report Authenticated By: Tiburcio Pea   Dg Chest Port 1 View  01/05/2013   *RADIOLOGY REPORT*  Clinical Data: Atelectasis.  PORTABLE CHEST - 1 VIEW  Comparison: 01/04/2013.  Findings: The support apparatus is unchanged.  Endotracheal tube tip is 29 mm from the carina.  Pulmonary aeration is improved compared yesterday's exam with decreasing basilar atelectasis. Positional shift in the pleural effusions.  Pulmonary vascular congestion persists.  Cardiopericardial silhouette grossly appears unchanged.  IMPRESSION:  1.  Stable support apparatus. 2.  Improving pulmonary aeration with positional shift in the pleural effusions.  Overall low volume chest.   Original Report Authenticated By: Andreas Newport, M.D.   Dg Chest Port 1 View  01/04/2013   *RADIOLOGY REPORT*  Clinical Data: Evaluate endotracheal tube position.  Infiltrate and atelectasis.  PORTABLE CHEST - 1 VIEW  Comparison: 01/03/2013.  Findings: Endotracheal tube tip is 28 mm from the carina.  Right IJ vas cath.  Enteric tube is present with the tip not visualized. There is probably a loop of the enteric tube in the proximal fundus of the  stomach.  Markedly low lung volumes remain present with basilar atelectasis and diffuse bilateral basilar predominant airspace disease most compatible with pulmonary edema.  Bilateral pleural effusions are also present.  Left subclavian central line appears similar with the tip in the mid-to-lower SVC.  IMPRESSION:  1.  Endotracheal tube tip 28 mm from the carina. 2.  Other support apparatus unchanged. 3.  Unchanged pulmonary aeration with bilateral airspace disease/edema, pleural effusions and  atelectasis.   Original Report Authenticated By: Andreas Newport, M.D.   Dg Chest Port 1 View  01/03/2013   *RADIOLOGY REPORT*  Clinical Data: Endotracheal tube placement  PORTABLE CHEST - 1 VIEW  Comparison: Prior chest x-ray 01/02/2013  Findings: The tip of the endotracheal tube is 1.5 cm above the carina.  The patient is in a kyphotic position.  The right IJ temporary hemodialysis catheter tip projects over the distal SVC. The tip of the left subclavian approach central venous catheter projects over the superior cavoatrial junction. The nasogastric tube can be seen coiled within the gastric fundus.  Inspiratory volumes are very low.  There is mild pulmonary edema and diffuse bilateral interstitial and airspace opacities.  Probable bilateral layering effusions.  Stable cardiomegaly.  IMPRESSION:  1.  The tip of the endotracheal tube is 1.5 cm above the carina. Relatively distal placement of the endotracheal tube likely exaggerated by the patient is kyphotic positioning. 2.  Other support apparatus in stable and satisfactory position. 3.  Similar appearance of the lungs with very low inspiratory volumes, edema, bilateral layering effusions and infiltrate versus atelectasis.   Original Report Authenticated By: Malachy Moan, M.D.   Dg Chest Port 1 View  01/02/2013   *RADIOLOGY REPORT*  Clinical Data: Endotracheal tube position  PORTABLE CHEST - 1 VIEW  Comparison: 01/01/2013  Findings: Endotracheal tube is  appropriately positioned.  Insert NG right IJ central line tip terminates over the distal SVC.  Mild cardiomegaly process of central vascular congestion and small pleural effusions obscuring lung bases.  IMPRESSION: Support apparatus as above.  Central vascular congestion with small pleural effusions.   Original Report Authenticated By: Christiana Pellant, M.D.   Dg Chest Port 1 View  01/01/2013   *RADIOLOGY REPORT*  Clinical Data: Hypoxia  PORTABLE CHEST - 1 VIEW  Comparison: December 31, 2012  Findings:  Endotracheal tube tip is 2.5 cm above the carina. Central catheters are unchanged in position. Nasogastric tube tip and side port are in the stomach region.  No apparent pneumothorax.  There is consolidation in the left lower lobe with small left effusion.  The right lung is clear.  Heart is borderline prominent with normal pulmonary vascularity.  No appreciable adenopathy.  IMPRESSION:  Tube and catheter positions as described.  No pneumothorax. There is consolidation in the left lower lobe with small left effusion.  Right lung clear.   Original Report Authenticated By: Bretta Bang, M.D.   Dg Chest Port 1 View  12/31/2012   *RADIOLOGY REPORT*  Clinical Data: Critically ill patient with possible change in position of one of the indwelling support apparatus, the Impella device.  PORTABLE CHEST - 1 VIEW 12/31/2012 1141 hours:  Comparison: Portable chest x-ray earlier same date 0608 hours.  Findings: The Impella device which has been placed from a femoral approach is unchanged in position since the examination earlier today, with the distal hook in the left ventricle and the device traversing the aortic valve.  Endotracheal tube tip somewhat low, chest approximately 2 cm above the carina, as noted previously. Remaining support apparatus satisfactory with the Swan-Ganz catheter tip overlying the proximal right main pulmonary artery, the right jugular central venous catheter tip overlying the lower SVC, the left  subclavian central venous catheter tip projecting over the upper SVC, and the nasogastric tube looped in the stomach. External pacing pads.  Markedly suboptimal inspiration with atelectasis in the lower lobes, right greater than left, unchanged. Pulmonary venous hypertension without overt edema, unchanged.  No new  pulmonary parenchymal abnormalities.  IMPRESSION:  1.  Support apparatus satisfactory with the exception that the endotracheal tube tip is low, just about 2 cm above the carina. Specifically, no change in the position of the Impella device since earlier same date. 2.  Suboptimal inspiration with atelectasis in the lung bases, right greater than left, unchanged since earlier in the day. 3.  Pulmonary venous hypertension without overt edema, also unchanged. 4.  No new pulmonary parenchymal abnormalities.   Original Report Authenticated By: Hulan Saas, M.D.   Dg Chest Port 1 View  12/31/2012   *RADIOLOGY REPORT*  Clinical Data: Endotracheal tube placement  PORTABLE CHEST - 1 VIEW  Comparison: 12/30/2012  Findings: Endotracheal tube tip is 1.1 cm from the carina.  NG tube stable.  Swan-Ganz catheter from the abdomen is stable with its tip in the right hilum.  Right internal jugular dialysis catheter is stable.  Left subclavian central venous catheter is stable.  Low volumes, bilateral pleural effusions, and mild edema are not significantly changed.  No pneumothorax.  IMPRESSION: Stable pleural effusions and edema.   Original Report Authenticated By: Jolaine Click, M.D.   Dg Chest Port 1 View  12/30/2012   *RADIOLOGY REPORT*  Clinical Data: Catheter placement  PORTABLE CHEST - 1 VIEW  Comparison: Same date.  Findings: No change is noted in position of endotracheal or nasogastric tubes.  Left subclavian catheter is unchanged in position with tip in SVC.  There has been interval placement of right internal jugular dialysis catheter with distal tip in expected position of the SVC.  No pneumothorax is  noted. Hypoinflation of the lungs is noted.  Central pulmonary vascular congestion is noted.  IMPRESSION: Status post placement of right internal jugular dialysis catheter with tip in expected position of the SVC.  No pneumothorax is seen.   Original Report Authenticated By: Lupita Raider.,  M.D.   Dg Chest Port 1 View  12/30/2012   *RADIOLOGY REPORT*  Clinical Data: Endotracheal tube placement.  PORTABLE CHEST - 1 VIEW  Comparison: 12/29/2012.  Findings: Low volume chest.  The endotracheal tube tip is 24 mm from the carina.  Defibrillator pads over the chest.  Enteric tube is present with the tip not visualized.  Inferior presumed femoral approach Swan-Ganz catheter is present with the tip in the descending right pulmonary artery.  Progressive atelectasis/volume loss in the right middle and right lower lobes.  Prominent atelectasis associated with lower lung volumes.  Pulmonary vascular congestion is accentuated by the low volumes. Left subclavian central line remains present with the tip in the mid to lower SVC.  IMPRESSION:  1.  Stable support apparatus aside from the addition of what appears to be a femoral approach Swan-Ganz catheter with the tip in the descending right pulmonary artery. 2.  Lower lung volumes with increasing atelectasis, most prominent at the right base.   Original Report Authenticated By: Andreas Newport, M.D.   Dg Chest Portable 1 View  12/29/2012   *RADIOLOGY REPORT*  Clinical Data: Unresponsive  PORTABLE CHEST - 1 VIEW  Comparison: Chest radiograph 12/29/2012  Findings: Endotracheal tube is 3.5 cm from carina.  Left central venous line is unchanged.  Stable cardiac silhouette.  There is central venous congestion similar prior.  There is bibasilar atelectasis greater on the right not changed.  IMPRESSION:  1.  Stable support apparatus. 2.  No interval change.  3.  Low lung volumes, basilar atelectasis, and central venous congestion.   Original Report Authenticated By: Genevive Bi,  M.D.   Dg Chest Port 1 View  12/29/2012   *RADIOLOGY REPORT*  Clinical Data: Evaluate endotracheal tube and left subclavian line placement  PORTABLE CHEST - 1 VIEW  Comparison: Earlier same day  Findings:  Grossly unchanged enlarged cardiac silhouette and mediastinal contours, possibly attributable to decreased lung volumes and AP projection.  An endotracheal tube overlies the tracheal air column with tip superior to the carina.  Interval placement of a left subclavian vein approach central venous catheter with tip projecting over the superior cavoatrial junction.  Enteric tube tip and side port project below the left hemidiaphragm.  No pneumothorax.  Pulmonary vasculature is indistinct with cephalization of flow.  Small/trace bilateral effusions are suspected with minimal amount of fluid layering within the right minor fissure.  Worsening perihilar opacities.  Unchanged bones.  IMPRESSION: 1.  Appropriately positioned support apparatus as above.  No pneumothorax. 2.  Suspected pulmonary edema. 3.  Reduced lung volumes with worsening perihilar opacities, likely atelectasis.  findings suggestive of pulmonary edema   Original Report Authenticated By: Tacey Ruiz, MD   Dg Chest Portable 1 View  12/29/2012   *RADIOLOGY REPORT*  Clinical Data: Status post intubation  PORTABLE CHEST - 1 VIEW  Comparison: None.  Findings: An endotracheal tube and nasogastric catheter are noted. Nasogastric catheter appears coiled within the distal esophagus. The endotracheal tube is in satisfactory position.  The lungs are well-aerated with some mild right basilar atelectasis.   IMPRESSION: Tubes and lines as described above.  Mild right basilar atelectasis.   Original Report Authenticated By: Alcide Clever, M.D.   Dg Abd Portable 1v  12/30/2012   *RADIOLOGY REPORT*  Clinical Data: NG tube placement  PORTABLE ABDOMEN - 1 VIEW  Comparison: None.  Findings: The NG tube is curled in the fundus of the stomach.  The tip is in the body.   No disproportionate dilatation of bowel. Multiple linear devices project over the abdomen.  IMPRESSION: NG tube is coiled in the stomach   Original Report Authenticated By: Jolaine Click, M.D.    Microbiology: No results found for this or any previous visit (from the past 240 hour(s)).   Labs: Basic Metabolic Panel:  Recent Labs Lab 01/21/13 0545 01/22/13 0420 01/23/13 0714 01/24/13 0435 01/25/13 0500 01/26/13 0630 01/27/13 0610  NA 137 140 140 141 141 140 142  K 5.3* 4.6 4.9 5.0 4.7 4.8 4.6  CL 99 100 101 101 102 103 105  CO2 25 28 28 26 25 25 22   GLUCOSE 80 81 86 86 78 83 80  BUN 53* 40* 54* 60* 66* 67* 63*  CREATININE 11.70* 8.62* 11.09* 11.63* 11.56* 10.04* 8.68*  CALCIUM 8.8 8.4 9.1 9.0 9.2 9.2 9.5  MG 2.6* 2.4 2.5 2.6* 2.6* 2.6* 2.6*  PHOS 5.2* 4.9* 4.7* 5.0*  --  5.2*  --    Liver Function Tests:  Recent Labs Lab 01/23/13 0714 01/24/13 0435 01/25/13 0500 01/26/13 0630 01/27/13 0610  AST 21 22 18 23 22   ALT 19 17 18 19 21   ALKPHOS 85 87 86 88 92  BILITOT 0.3 0.3 0.3 0.3 0.3  PROT 6.9 6.5 6.5 7.1 7.5  ALBUMIN 2.7* 2.7* 2.8* 3.0* 3.2*   No results found for this basename: LIPASE, AMYLASE,  in the last 168 hours No results found for this basename: AMMONIA,  in the last 168 hours CBC:  Recent Labs Lab 01/23/13 0714 01/24/13 0435 01/25/13 0500 01/26/13 0630 01/27/13 0610  WBC 9.0 8.8 8.1 8.2 9.4  NEUTROABS  4.7 5.0 4.1 4.6 5.2  HGB 9.0* 9.0* 9.2* 9.4* 10.3*  HCT 28.7* 28.9* 29.6* 30.1* 32.5*  MCV 98.3 98.3 98.3 97.7 97.3  PLT 340 349 313 305 344   Cardiac Enzymes: No results found for this basename: CKTOTAL, CKMB, CKMBINDEX, TROPONINI,  in the last 168 hours BNP: BNP (last 3 results)  Recent Labs  12/30/12 0500  PROBNP 3296.0*   CBG:  Recent Labs Lab 01/26/13 0730 01/26/13 1113 01/26/13 1723 01/26/13 2157 01/27/13 0739  GLUCAP 83 102* 76 88 86   Signed:  Martine Bleecker K  [facility] 01/27/2013, 11:44 AM

## 2013-01-26 NOTE — Progress Notes (Signed)
[facility] PROGRESS NOTE  ANNA LIVERS AVW:[id number] DOB: 1961-01-24 DOA: 12/29/2012 PCP: No primary provider on file.  Assessment/Plan: Cardiogenic shock/acute CHF NYHA class III;  - resolved - echocardiogram from 01/11/2013 shows grade 1 diastolic dysfunction.  HTN;  - within AHA guidelines no change the meds Acute renal failure/ATN;  - was on hemodialysis T/TH/Sat - followed by Dr Hyman Hopes (Nephrologist) Accidental opiate poisoning; - stable, resolved Tinea Pedis;  - continue Ketoconazole and Pumice stone for Lt Foot, Hydroxyazine for pruritis.  OSA; - patient doing well on hospital supplied CPAP, will require outpatient sleep study in order to reestablish his baseline.  --Counseled patient that he must wear his CPAP at night and that if he takes it off to go to the bathroom the nurses can help him readjust the mask (this was translated through his daughters)  Deconditioning; Seen by PT/OT, recs for SNF  Code Status: Full Family Communication: Pt in room (indicate person spoken with, relationship, and if by phone, the number) Disposition Plan: Pending SNF, possibly tomorrow per Nephro  Consultants:  Nephrology  Vascular Surgery  Orthopedic surgery  Cardiology  Critical care  Procedures: Echocardiogram 01/11/2013 - Left ventricle: The cavity size was normal. Wall thickness was normal. Systolic function was normal. LVEF= 55% to 60%.  Doppler parameters are consistent with abnormal left ventricular relaxation (grade 1 diastolic dysfunction). - Aortic valve: There was no stenosis. - Mitral valve: No significant regurgitation. - Left atrium: The atrium was mildly dilated. - Right ventricle: The cavity size was normal. Systolic function was normal.  HPI/Subjective: No acute events noted overnight  Objective: Filed Vitals:   01/25/13 1734 01/25/13 2139 01/26/13 0459 01/26/13 0910  BP: 112/67 122/72 113/68 126/70  Pulse: 81 66 72 70  Temp: 98.7 F (37.1  C) 99.5 F (37.5 C) 98.2 F (36.8 C) 98.2 F (36.8 C)  TempSrc: Oral Oral Oral Oral  Resp: 20 20 18 18   Height:      Weight:  110 kg (242 lb 8.1 oz)    SpO2: 100% 97% 98% 100%    Intake/Output Summary (Last 24 hours) at 01/26/13 0947 Last data filed at 01/26/13 0900  Gross per 24 hour  Intake   1180 ml  Output   2825 ml  Net  -1645 ml   Filed Weights   01/23/13 2236 01/24/13 2114 01/25/13 2139  Weight: 111.6 kg (246 lb 0.5 oz) 110.4 kg (243 lb 6.2 oz) 110 kg (242 lb 8.1 oz)    Exam:   General:  Awake, in nad  Cardiovascular: regular, s1, s2  Respiratory: normal resp effort, no wheezing  Abdomen: soft, nondistended  Musculoskeletal: perfused, no clubbing   Data Reviewed: Basic Metabolic Panel:  Recent Labs Lab 01/21/13 0545 01/22/13 0420 01/23/13 0714 01/24/13 0435 01/25/13 0500 01/26/13 0630  NA 137 140 140 141 141 140  K 5.3* 4.6 4.9 5.0 4.7 4.8  CL 99 100 101 101 102 103  CO2 25 28 28 26 25 25   GLUCOSE 80 81 86 86 78 83  BUN 53* 40* 54* 60* 66* 67*  CREATININE 11.70* 8.62* 11.09* 11.63* 11.56* 10.04*  CALCIUM 8.8 8.4 9.1 9.0 9.2 9.2  MG 2.6* 2.4 2.5 2.6* 2.6* 2.6*  PHOS 5.2* 4.9* 4.7* 5.0*  --  5.2*   Liver Function Tests:  Recent Labs Lab 01/22/13 0420 01/23/13 0714 01/24/13 0435 01/25/13 0500 01/26/13 0630  AST 20 21 22 18 23   ALT 19 19 17 18 19   ALKPHOS 83 85 87 86  88  BILITOT 0.3 0.3 0.3 0.3 0.3  PROT 6.7 6.9 6.5 6.5 7.1  ALBUMIN 2.4* 2.7* 2.7* 2.8* 3.0*   No results found for this basename: LIPASE, AMYLASE,  in the last 168 hours No results found for this basename: AMMONIA,  in the last 168 hours CBC:  Recent Labs Lab 01/22/13 0420 01/23/13 0714 01/24/13 0435 01/25/13 0500 01/26/13 0630  WBC 9.4 9.0 8.8 8.1 8.2  NEUTROABS 4.8 4.7 5.0 4.1 4.6  HGB 9.1* 9.0* 9.0* 9.2* 9.4*  HCT 28.8* 28.7* 28.9* 29.6* 30.1*  MCV 98.3 98.3 98.3 98.3 97.7  PLT 327 340 349 313 305   Cardiac Enzymes: No results found for this basename:  CKTOTAL, CKMB, CKMBINDEX, TROPONINI,  in the last 168 hours BNP (last 3 results)  Recent Labs  12/30/12 0500  PROBNP 3296.0*   CBG:  Recent Labs Lab 01/24/13 2117 01/25/13 0748 01/25/13 1111 01/25/13 1638 01/25/13 2142  GLUCAP 88 78 101* 89 95    No results found for this or any previous visit (from the past 240 hour(s)).   Studies: No results found.  Scheduled Meds: . antiseptic oral rinse  15 mL Mouth Rinse TID WC & HS  . aspirin  325 mg Oral Daily  . calcium acetate  1,334 mg Oral TID WC  . carvedilol  6.25 mg Per NG tube BID WC  . darbepoetin (ARANESP) injection - NON-DIALYSIS  150 mcg Subcutaneous Q Mon-1800  . heparin subcutaneous  5,000 Units Subcutaneous Q8H  . insulin aspart  0-9 Units Subcutaneous TID AC & HS  . ketoconazole   Topical BID  . lanthanum  1,000 mg Oral TID WC  . multivitamin with minerals  1 tablet Oral Daily  . pantoprazole  40 mg Oral Daily  . simvastatin  20 mg Oral q1800   Continuous Infusions: . sodium chloride Stopped (01/01/13 1400)  . sodium chloride Stopped (12/31/12 2300)    Principal Problem:   Acute respiratory failure Active Problems:   Cardiogenic shock   Acute renal failure (ARF)   Cardiac and respiratory arrest with VT due to:   Aspiration pneumonia   Accidental opiate poisoning   ARF (acute renal failure) with tubular necrosis requiring HD   Acute systolic congestive heart failure, NYHA class 3   Anemia   Thrombocytopenia, unspecified   HTN (hypertension)   Tinea pedis   OSA on CPAP   Physical deconditioning  Time spent:  Kely Dohn K  [facility] Pager [phone]. If 7PM-7AM, please contact night-coverage at [email or web address], password [facility] 01/26/2013, 9:47 AM  LOS: 28 days

## 2013-01-26 NOTE — Progress Notes (Signed)
Subjective: 4 weeks and 2 days s/p achilles tendon reconstruction.  Pt has been bearing weight on the right foot in a cam boot with heel lift.  No c/o pain.  Notes suggest that he may be discontinuing hemodialysis soon and going to a SNF for rehab.  Objective: Vital signs in last 24 hours: Temp:  [98.2 F (36.8 C)-99.5 F (37.5 C)] 98.2 F (36.8 C) (08/21 0910) Pulse Rate:  [66-81] 70 (08/21 0910) Resp:  [18-20] 18 (08/21 0910) BP: (107-126)/(67-73) 126/70 mmHg (08/21 0910) SpO2:  [97 %-100 %] 100 % (08/21 0910) Weight:  [110 kg (242 lb 8.1 oz)] 110 kg (242 lb 8.1 oz) (08/20 2139)  Intake/Output from previous day: 08/20 0701 - 08/21 0700 In: 600 [P.O.:600] Out: 2925 [Urine:2925] Intake/Output this shift: Total I/O In: 700 [P.O.:700] Out: 400 [Urine:400]   Recent Labs  01/24/13 0435 01/25/13 0500 01/26/13 0630  HGB 9.0* 9.2* 9.4*    Recent Labs  01/25/13 0500 01/26/13 0630  WBC 8.1 8.2  RBC 3.01* 3.08*  HCT 29.6* 30.1*  PLT 313 305    Recent Labs  01/25/13 0500 01/26/13 0630  NA 141 140  K 4.7 4.8  CL 102 103  CO2 25 25  BUN 66* 67*  CREATININE 11.56* 10.04*  GLUCOSE 78 83  CALCIUM 9.2 9.2   No results found for this basename: LABPT, INR,  in the last 72 hours  PE:  wn wd male in nad.  R ankle with DF to neutral.  Incision healing well.  No swelling.  Assessment/Plan: Healing well s/p right achilles tendon reconstruction.  Pt may continue to bear weight as tolerated on the right foot in a cam boot.  He should follow up with me in the office in 2 weeks.   Toni Arthurs 01/26/2013, 10:42 AM

## 2013-01-26 NOTE — Progress Notes (Signed)
Patient ID: Nicholas Potts, male   DOB: [birth year], 52 y.o.   MRN: [id number] S:no new complaints O:BP 113/68  Pulse 72  Temp(Src) 98.2 F (36.8 C) (Oral)  Resp 18  Ht 6' (1.829 m)  Wt 110 kg (242 lb 8.1 oz)  BMI 32.88 kg/m2  SpO2 98%  Intake/Output Summary (Last 24 hours) at 01/26/13 0903 Last data filed at 01/26/13 4098  Gross per 24 hour  Intake    820 ml  Output   2825 ml  Net  -2005 ml   Intake/Output: I/O last 3 completed shifts: In: 600 [P.O.:600] Out: 4125 [Urine:4125]  Intake/Output this shift:  Total I/O In: 340 [P.O.:340] Out: 400 [Urine:400] Weight change: -0.4 kg (-14.1 oz) Gen:WD obese HM in NAD Ext:tr edema   Recent Labs Lab 01/20/13 0640 01/21/13 0545 01/22/13 0420 01/23/13 0714 01/24/13 0435 01/25/13 0500 01/26/13 0630  NA 139 137 140 140 141 141 140  K 4.9 5.3* 4.6 4.9 5.0 4.7 4.8  CL 100 99 100 101 101 102 103  CO2 28 25 28 28 26 25 25   GLUCOSE 82 80 81 86 86 78 83  BUN 40* 53* 40* 54* 60* 66* 67*  CREATININE 9.45* 11.70* 8.62* 11.09* 11.63* 11.56* 10.04*  ALBUMIN 2.5* 2.6* 2.4* 2.7* 2.7* 2.8* 3.0*  CALCIUM 8.7 8.8 8.4 9.1 9.0 9.2 9.2  PHOS 4.6 5.2* 4.9* 4.7* 5.0*  --  5.2*  AST 19 18 20 21 22 18 23   ALT 20 19 19 19 17 18 19    Liver Function Tests:  Recent Labs Lab 01/24/13 0435 01/25/13 0500 01/26/13 0630  AST 22 18 23   ALT 17 18 19   ALKPHOS 87 86 88  BILITOT 0.3 0.3 0.3  PROT 6.5 6.5 7.1  ALBUMIN 2.7* 2.8* 3.0*   No results found for this basename: LIPASE, AMYLASE,  in the last 168 hours No results found for this basename: AMMONIA,  in the last 168 hours CBC:  Recent Labs Lab 01/22/13 0420 01/23/13 0714 01/24/13 0435 01/25/13 0500 01/26/13 0630  WBC 9.4 9.0 8.8 8.1 8.2  NEUTROABS 4.8 4.7 5.0 4.1 4.6  HGB 9.1* 9.0* 9.0* 9.2* 9.4*  HCT 28.8* 28.7* 28.9* 29.6* 30.1*  MCV 98.3 98.3 98.3 98.3 97.7  PLT 327 340 349 313 305   Cardiac Enzymes: No results found for this basename: CKTOTAL, CKMB, CKMBINDEX,  TROPONINI,  in the last 168 hours CBG:  Recent Labs Lab 01/24/13 2117 01/25/13 0748 01/25/13 1111 01/25/13 1638 01/25/13 2142  GLUCAP 88 78 101* 89 95    Iron Studies: No results found for this basename: IRON, TIBC, TRANSFERRIN, FERRITIN,  in the last 72 hours Studies/Results: No results found. Marland Kitchen antiseptic oral rinse  15 mL Mouth Rinse TID WC & HS  . aspirin  325 mg Oral Daily  . calcium acetate  1,334 mg Oral TID WC  . carvedilol  6.25 mg Per NG tube BID WC  . darbepoetin (ARANESP) injection - NON-DIALYSIS  150 mcg Subcutaneous Q Mon-1800  . heparin subcutaneous  5,000 Units Subcutaneous Q8H  . insulin aspart  0-9 Units Subcutaneous TID AC & HS  . ketoconazole   Topical BID  . lanthanum  1,000 mg Oral TID WC  . multivitamin with minerals  1 tablet Oral Daily  . pantoprazole  40 mg Oral Daily  . simvastatin  20 mg Oral q1800    BMET    Component Value Date/Time   NA 140 01/26/2013 0630   K 4.8 01/26/2013 0630  CL 103 01/26/2013 0630   CO2 25 01/26/2013 0630   GLUCOSE 83 01/26/2013 0630   BUN 67* 01/26/2013 0630   CREATININE 10.04* 01/26/2013 0630   CALCIUM 9.2 01/26/2013 0630   GFRNONAA 5* 01/26/2013 0630   GFRAA 6* 01/26/2013 0630   CBC    Component Value Date/Time   WBC 8.2 01/26/2013 0630   RBC 3.08* 01/26/2013 0630   HGB 9.4* 01/26/2013 0630   HCT 30.1* 01/26/2013 0630   PLT 305 01/26/2013 0630   MCV 97.7 01/26/2013 0630   MCH 30.5 01/26/2013 0630   MCHC 31.2 01/26/2013 0630   RDW 13.8 01/26/2013 0630   LYMPHSABS 2.1 01/26/2013 0630   MONOABS 0.9 01/26/2013 0630   EOSABS 0.4 01/26/2013 0630   BASOSABS 0.2* 01/26/2013 0630     Assessment/Plan:  1. AKI- has been dialysis-dependent since 12/30/12 but now starting to show improvement of UOP. Rate of rise has significantly slowed and UOP has tripled. Finally some improvement in Scr. Will plan to hold off on future HD for now and follow UOP, Scr and K levels. Scr improving for 3 days and would plan on d/cing PC tomorrow if Scr  is better and d/c to SNF and f/u with Dr. Hyman Hopes as an outpt with close monitoring of his labs through the SNF.  Appreciate primary service's assistance with following patients recovery. 2. Cardiogenic shock- improved 3. Opiate overdose- ?accidental, resolved 4. ABLA/?ACDz- on aranesp 5. HTN 6. Hyperkalemia- improved post HD on 01/21/13 will follow 7. Vascular access- HD catheter in place, AVF on hold as his renal function appears to be improving and had  8. OSA- on CPAP 9. Dispo- hold off on SNF transfer as he may not require outpt HD, if labs improving, will have him f/u with Dr. Hyman Hopes as an outpt and remove PC possibly tomorrow.  Hopefully discharge tomorrow after PC removal if Scr cont to improve. 10.   Quinnie Barcelo A

## 2013-01-26 NOTE — Telephone Encounter (Signed)
I spoke with daughter. She stated pt is going to have sleep study done. She stated she was told it takes about 9 days after that for him to be set up. She is concerned bc he will have to wait. She then informed me pt is going to rehab facility to stay as soon as he is d/c from the hospital and will be alone overnight bc family can't stay. i advised her if they see he needs O2 or anything then they will place him on it and call us for recs. She was relieved bc she thought he was just going to be left alone no one checking on him. She did not need anything further

## 2013-01-27 ENCOUNTER — Inpatient Hospital Stay (HOSPITAL_COMMUNITY): Payer: BC Managed Care – PPO

## 2013-01-27 LAB — COMPREHENSIVE METABOLIC PANEL
Alkaline Phosphatase: 92 U/L (ref 39–117)
BUN: 63 mg/dL — ABNORMAL HIGH (ref 6–23)
Chloride: 105 mEq/L (ref 96–112)
GFR calc Af Amer: 7 mL/min — ABNORMAL LOW (ref >90)
Glucose, Bld: 80 mg/dL (ref 70–99)
Potassium: 4.6 mEq/L (ref 3.5–5.1)
Total Bilirubin: 0.3 mg/dL (ref 0.3–1.2)
Total Protein: 7.5 g/dL (ref 6.0–8.3)

## 2013-01-27 LAB — CBC WITH DIFFERENTIAL/PLATELET
Basophils Absolute: 0.3 10*3/uL — ABNORMAL HIGH (ref 0.0–0.1)
HCT: 32.5 % — ABNORMAL LOW (ref 39.0–52.0)
Lymphs Abs: 2.5 10*3/uL (ref 0.7–4.0)
MCH: 30.8 pg (ref 26.0–34.0)
MCHC: 31.7 g/dL (ref 30.0–36.0)
MCV: 97.3 fL (ref 78.0–100.0)
Monocytes Absolute: 1 10*3/uL (ref 0.1–1.0)
Monocytes Relative: 11 % (ref 3–12)
Neutro Abs: 5.2 10*3/uL (ref 1.7–7.7)
RDW: 14.1 % (ref 11.5–15.5)
WBC: 9.4 10*3/uL (ref 4.0–10.5)

## 2013-01-27 LAB — GLUCOSE, CAPILLARY: Glucose-Capillary: 87 mg/dL (ref 70–99)

## 2013-01-27 LAB — MAGNESIUM: Magnesium: 2.6 mg/dL — ABNORMAL HIGH (ref 1.5–2.5)

## 2013-01-27 MED ORDER — KETOCONAZOLE 2 % EX CREA: TOPICAL_CREAM | Freq: Two times a day (BID) | CUTANEOUS | Status: DC

## 2013-01-27 MED ORDER — CALCIUM ACETATE 667 MG PO CAPS: 1334.0000 mg | ORAL_CAPSULE | Freq: Three times a day (TID) | ORAL | Status: DC

## 2013-01-27 MED ORDER — CARVEDILOL 6.25 MG PO TABS: 6.2500 mg | ORAL_TABLET | Freq: Two times a day (BID) | ORAL | Status: DC

## 2013-01-27 MED ORDER — LANTHANUM CARBONATE 1000 MG PO CHEW: 1000.0000 mg | CHEWABLE_TABLET | Freq: Three times a day (TID) | ORAL | Status: DC

## 2013-01-27 MED ORDER — HYDROXYZINE HCL 25 MG PO TABS: 25.0000 mg | ORAL_TABLET | Freq: Three times a day (TID) | ORAL | Status: DC | PRN

## 2013-01-27 NOTE — Progress Notes (Signed)
PT Cancellation Note  Patient Details Name: Nicholas Potts MRN: [id number] DOB: 09/20/1960   Cancelled Treatment:    Reason Eval/Treat Not Completed: Patient at procedure or test/unavailable   [facility] 01/27/2013, 3:09 PM

## 2013-01-27 NOTE — Progress Notes (Signed)
Patient discharged to [facility]. Report called to Joni Reining, Charity fundraiser.  Patient AVS and CPAP settings reviewed with and faxed to facility.  Patient remains stable; no signs or symptoms of distress.  Patient transported via EMS.  Patient's belongings transported by daughter.

## 2013-01-27 NOTE — Progress Notes (Signed)
Patient ID: Nicholas Potts, male   DOB: May 22, 1961, 52 y.o.   MRN: [id number] S:no complaints O:BP 127/71  Pulse 70  Temp(Src) 97.9 F (36.6 C) (Oral)  Resp 20  Ht 6' (1.829 m)  Wt 108.6 kg (239 lb 6.7 oz)  BMI 32.46 kg/m2  SpO2 99%  Intake/Output Summary (Last 24 hours) at 01/27/13 0934 Last data filed at 01/27/13 0900  Gross per 24 hour  Intake    600 ml  Output   2875 ml  Net  -2275 ml   Intake/Output: I/O last 3 completed shifts: In: 1060 [P.O.:1060] Out: 5025 [Urine:5025]  Intake/Output this shift:  Total I/O In: 240 [P.O.:240] Out: -  Weight change: -1.4 kg (-3 lb 1.4 oz) Gen:WD obese HM in NAD CVS:no rub Resp:CTA WUJ:[id number] Ext:no edema   Recent Labs Lab 01/21/13 0545 01/22/13 0420 01/23/13 0714 01/24/13 0435 01/25/13 0500 01/26/13 0630 01/27/13 0610  NA 137 140 140 141 141 140 142  K 5.3* 4.6 4.9 5.0 4.7 4.8 4.6  CL 99 100 101 101 102 103 105  CO2 25 28 28 26 25 25 22   GLUCOSE 80 81 86 86 78 83 80  BUN 53* 40* 54* 60* 66* 67* 63*  CREATININE 11.70* 8.62* 11.09* 11.63* 11.56* 10.04* 8.68*  ALBUMIN 2.6* 2.4* 2.7* 2.7* 2.8* 3.0* 3.2*  CALCIUM 8.8 8.4 9.1 9.0 9.2 9.2 9.5  PHOS 5.2* 4.9* 4.7* 5.0*  --  5.2*  --   AST 18 20 21 22 18 23 22   ALT 19 19 19 17 18 19 21    Liver Function Tests:  Recent Labs Lab 01/25/13 0500 01/26/13 0630 01/27/13 0610  AST 18 23 22   ALT 18 19 21   ALKPHOS 86 88 92  BILITOT 0.3 0.3 0.3  PROT 6.5 7.1 7.5  ALBUMIN 2.8* 3.0* 3.2*   No results found for this basename: LIPASE, AMYLASE,  in the last 168 hours No results found for this basename: AMMONIA,  in the last 168 hours CBC:  Recent Labs Lab 01/23/13 0714 01/24/13 0435 01/25/13 0500 01/26/13 0630 01/27/13 0610  WBC 9.0 8.8 8.1 8.2 9.4  NEUTROABS 4.7 5.0 4.1 4.6 5.2  HGB 9.0* 9.0* 9.2* 9.4* 10.3*  HCT 28.7* 28.9* 29.6* 30.1* 32.5*  MCV 98.3 98.3 98.3 97.7 97.3  PLT 340 349 313 305 344   Cardiac Enzymes: No results found for this basename: CKTOTAL,  CKMB, CKMBINDEX, TROPONINI,  in the last 168 hours CBG:  Recent Labs Lab 01/26/13 0730 01/26/13 1113 01/26/13 1723 01/26/13 2157 01/27/13 0739  GLUCAP 83 102* 76 88 86    Iron Studies: No results found for this basename: IRON, TIBC, TRANSFERRIN, FERRITIN,  in the last 72 hours Studies/Results: No results found. Marland Kitchen antiseptic oral rinse  15 mL Mouth Rinse TID WC & HS  . aspirin  325 mg Oral Daily  . calcium acetate  1,334 mg Oral TID WC  . carvedilol  6.25 mg Per NG tube BID WC  . darbepoetin (ARANESP) injection - NON-DIALYSIS  150 mcg Subcutaneous Q Mon-1800  . heparin subcutaneous  5,000 Units Subcutaneous Q8H  . insulin aspart  0-9 Units Subcutaneous TID AC & HS  . ketoconazole   Topical BID  . lanthanum  1,000 mg Oral TID WC  . multivitamin with minerals  1 tablet Oral Daily  . pantoprazole  40 mg Oral Daily  . simvastatin  20 mg Oral q1800    BMET    Component Value Date/Time   NA 142 01/27/2013  0610   K 4.6 01/27/2013 0610   CL 105 01/27/2013 0610   CO2 22 01/27/2013 0610   GLUCOSE 80 01/27/2013 0610   BUN 63* 01/27/2013 0610   CREATININE 8.68* 01/27/2013 0610   CALCIUM 9.5 01/27/2013 0610   GFRNONAA 6* 01/27/2013 0610   GFRAA 7* 01/27/2013 0610   CBC    Component Value Date/Time   WBC 9.4 01/27/2013 0610   RBC 3.34* 01/27/2013 0610   HGB 10.3* 01/27/2013 0610   HCT 32.5* 01/27/2013 0610   PLT 344 01/27/2013 0610   MCV 97.3 01/27/2013 0610   MCH 30.8 01/27/2013 0610   MCHC 31.7 01/27/2013 0610   RDW 14.1 01/27/2013 0610   LYMPHSABS 2.5 01/27/2013 0610   MONOABS 1.0 01/27/2013 0610   EOSABS 0.4 01/27/2013 0610   BASOSABS 0.3* 01/27/2013 0610     Assessment/Plan:  1. AKI- has been dialysis-dependent since 12/30/12 but now continues to show improvement of UOP and Scr. Would plan on d/cing PermCath today and d/c to SNF if okay with primary Svc.   1. F/u with Dr. Hyman Hopes as an outpt on 02/24/13 at 11am at [facility] 2. Pt will require weekly BMP and close  monitoring of his labs through the SNF.  3. Appreciate primary service's assistance with following patients recovery. 2. Cardiogenic shock- improved 3. Opiate overdose- ?accidental, resolved 4. ABLA/?ACDz- on aranesp 5. HTN 6. Hyperkalemia- improved post HD on 01/21/13 will follow 7. Vascular access- HD catheter in place, AVF on hold as his renal function appears to be improving and had  8. OSA- on CPAP 9. Dispo- stable for SNF transfer since he has had improving renal function and will not require outpt HD. F/u with Dr. Hyman Hopes as an outpt on 02/24/13 as above and remove PC today. Hopefully discharge today after PC removal. 10.   Nicholas Potts A

## 2013-02-20 ENCOUNTER — Ambulatory Visit (HOSPITAL_BASED_OUTPATIENT_CLINIC_OR_DEPARTMENT_OTHER): Payer: BC Managed Care – PPO | Attending: Internal Medicine

## 2013-02-20 VITALS — Ht 67.0 in | Wt 240.0 lb

## 2013-02-20 DIAGNOSIS — G4733 Obstructive sleep apnea (adult) (pediatric): Secondary | ICD-10-CM | POA: Insufficient documentation

## 2013-02-25 DIAGNOSIS — G4733 Obstructive sleep apnea (adult) (pediatric): Secondary | ICD-10-CM

## 2013-02-25 NOTE — Procedures (Signed)
NAMEJERE, Nicholas Potts           ACCOUNT NO.:  [id number]  MEDICAL RECORD NO.:  [id number]          PATIENT TYPE:  OUT  LOCATION:  SLEEP CENTER                 FACILITY[facility]  [facility]  PHYSICIAN:  Delvis Kau D. Maple Hudson, MD, FCCP, FACPDATE OF BIRTH:  1960-12-02  DATE OF STUDY:  02/20/2013                           NOCTURNAL POLYSOMNOGRAM  REFERRING PHYSICIAN:  SAAD AMIN  REFERRING PHYSICIAN:  Dr. Eloisa Northern.  INDICATION FOR STUDY:  Hypersomnia with sleep apnea.  EPWORTH SLEEPINESS SCORE:  12/24.  BMI 37.6, weight 240 pounds.  Height 6 feet 7 inches, neck 18.5 inches.  MEDICATIONS:  Home medications are charted for review.  SLEEP ARCHITECTURE:  Total sleep time 248.5 minutes with sleep efficiency 67.2%.  Stage I was 26.8%, stage II 56.1%, stage III absent, REM 17.1% of total sleep time.  Sleep latency 52.5 minutes, REM latency 61 minutes.  Awake after sleep onset 69 minutes.  Arousal index 52.6. Sleep was marked by frequent awakenings after sleep onset at midnight. Bedtime medication:  None.  RESPIRATORY DATA:  Apnea/hypopnea index (AHI) 79.7 per hour.  A total of 330 events was scored including 208 obstructive apneas, 1 central apnea, 1 mixed apnea, 120 hypopneas.  Events were not positional.  REM AHI 77.6 per hour.  Because of delayed sleep onset and inability to maintain sleep, he did not accumulate enough sleep time before 2 a.m. to meet protocol requirements for application of split protocol CPAP titration on this study.  OXYGEN DATA:  Moderately loud snoring with oxygen desaturation to a nadir of 53% and mean oxygen saturation through the study of 90.2% on room air.  CARDIAC DATA:  Sinus rhythm with frequent PVCs.  MOVEMENT/PARASOMNIA:  No significant movement disturbance.  Bathroom x1.  IMPRESSION/RECOMMENDATION: 1. Sleep architecture was significant for delayed sleep onset until     midnight and then markedly fragmented sleep with frequent brief     awakenings throughout the  night.  No bedtime medication was taken. 2. Severe obstructive sleep apnea/hypopnea syndrome, AHI 79.7 per     hour.  Non-positional events.  REM AHI 77.6 per hour.  Moderately     loud snoring with oxygen desaturation to a nadir of 53% and mean     oxygen saturation through the study of 90.2% on room air. 3. He did not maintain sleep sufficiently before 2 a.m. to meet     protocol requirements for application of split     protocol CPAP titration.  This study suggests he should get an     opportunity to try CPAP.  Recommend return for dedicated CPAP     titration study if appropriate.     Mikell Kazlauskas D. Maple Hudson, MD, [facility], FACP Diplomate, American Board of Sleep Medicine    CDY/MEDQ  D:  02/25/2013 10:46:58  T:  02/25/2013 11:08:05  Job:  [id number]

## 2013-03-15 ENCOUNTER — Ambulatory Visit (HOSPITAL_BASED_OUTPATIENT_CLINIC_OR_DEPARTMENT_OTHER): Payer: BC Managed Care – PPO | Attending: Specialist

## 2013-03-15 DIAGNOSIS — G4733 Obstructive sleep apnea (adult) (pediatric): Secondary | ICD-10-CM

## 2013-03-18 NOTE — Procedures (Signed)
Nicholas Potts, Nicholas Potts           ACCOUNT NO.:  [id number]  MEDICAL RECORD NO.:  [id number]          PATIENT TYPE:  OUT  LOCATION:  SLEEP CENTER                 FACILITY:  [facility]  PHYSICIAN:  Clinton D. Maple Hudson, MD, FCCP, FACPDATE OF BIRTH:  07[birth year]08-58  DATE OF STUDY:  03/15/2013                           NOCTURNAL POLYSOMNOGRAM  REFERRING PHYSICIAN:  DWIGHT M WILLIAMS  INDICATION FOR STUDY:  Hypersomnia with sleep apnea.  EPWORTH SLEEPINESS SCORE:  12/24.  BMI 37.6, weight 240 pounds, height 67 inches, neck 18.5 inches.  MEDICATIONS:  Home medications charted for review.  A baseline diagnostic NPSG on February 20, 2013 recorded AHI 79.7 per hour with body weight for that study 240 pounds.  SLEEP ARCHITECTURE:  Total sleep time 9 minutes.  Sleep efficiency 2%, stage I was 100%, although this stages were absent.  Sleep latency 174.5 minutes, awake after sleep onset 321 minutes.  Arousal index 60. Bedtime medication:  None.  RESPIRATORY DATA:  CPAP titrated to 7 CWP.  AHI 0 but almost no sleep.  OXYGEN DATA:  No snoring was noted.  Mean oxygen saturation 92.7% on room air.  CARDIAC DATA:  Sinus rhythm.  MOVEMENT-PARASOMNIA:  No limb jerk disturbance.  Bathroom x1.  IMPRESSIONS-RECOMMENDATIONS:  This was an unsuccessful study because the patient was unable to sleep.  Several masks were tried including finally a [employer] Simplus fullface mask.  Several sizes of different masks were tried.  Apparently, language difficulty was a problem, not relieved until the daughter, translator, was available in the morning. The patient was unable to breathe through his nose due to being congested.  He had not understood that he was allowed to breathe through his mouth.  Suggest discussion with the Sleep Center about a repeat of this study.  If nasal congestion is an ongoing problem then direct therapy for that may contribute.     Clinton D. Maple Hudson, MD, [facility], FACP Diplomate,  American Board of Sleep Medicine    CDY/MEDQ  D:  03/18/2013 09:12:34  T:  03/18/2013 13:23:25  Job:  [id number]

## 2013-03-22 ENCOUNTER — Ambulatory Visit (HOSPITAL_BASED_OUTPATIENT_CLINIC_OR_DEPARTMENT_OTHER): Payer: BC Managed Care – PPO | Attending: Specialist

## 2013-03-22 DIAGNOSIS — G4733 Obstructive sleep apnea (adult) (pediatric): Secondary | ICD-10-CM

## 2013-03-22 DIAGNOSIS — G4710 Hypersomnia, unspecified: Secondary | ICD-10-CM | POA: Insufficient documentation

## 2013-03-25 DIAGNOSIS — G4733 Obstructive sleep apnea (adult) (pediatric): Secondary | ICD-10-CM

## 2013-03-25 NOTE — Procedures (Signed)
Nicholas Potts, Nicholas Potts           ACCOUNT NO.:  [id number]  MEDICAL RECORD NO.:  [id number]          PATIENT TYPE:  OUT  LOCATION:  SLEEP CENTER                 FACILITY:  [facility]  PHYSICIAN:  Clinton D. Maple Hudson, MD, FCCP, FACPDATE OF BIRTH:  1959/04/05  DATE OF STUDY:  03/22/2013                           NOCTURNAL POLYSOMNOGRAM  REFERRING PHYSICIAN:  Eloisa Northern, MD  REFERRING PHYSICIAN:  Eloisa Northern, MD  INDICATION FOR STUDY:  Hypersomnia with sleep apnea.  EPWORTH SLEEPINESS SCORE:  12/24, BMI 37.6, weight 240 pounds, height 67 inches, neck 18.5 inches.  HOME MEDICATIONS:  Home medications are charted for review.  A baseline diagnostic NPSG on February 20, 2013, recorded AHI 79.7 per hour.  Body weight was 240 pounds.  CPAP titration is now requested.  SLEEP ARCHITECTURE:  Total sleep time 358.5 minutes with sleep efficiency 77.8%.  Stage I was 23%, stage II 50.5%.  Stage III absent, REM 26.5% of total sleep time.  Sleep latency 31 minutes, REM latency 32.5 minutes, awake after sleep onset 71.5 minutes.  Arousal index 5.4.  MEDICATION:  Bedtime medication:  NyQuil.  RESPIRATORY DATA:  CPAP titration protocol.  CPAP was titrated to 17, CWP, AHI 3.8 per hour.  He wore a medium [employer] Simplus Full Face mask with heated humidifier and EPR of 3.  OXYGEN DATA:  Snoring was prevented by CPAP and mean oxygen saturation held 92.5% on room air.  CARDIAC DATA:  Normal sinus rhythm.  MOVEMENT-PARASOMNIA:  No significant movement disturbance.  No bathroom trips.  IMPRESSIONS-RECOMMENDATIONS: 1. Successful CPAP titration to 17 CWP, AHI 3.8 per hour.  He wore a     medium [employer] Simplus Full Face mask with heated     humidifier and an EPR of 3.  Snoring was prevented and mean oxygen     saturation of 92.5% on room air. 2. Baseline diagnostic NPSG on February 20, 2013, had recorded AHI     79.7 per hour.  Body weight was 240 pounds for that study.     Clinton  D. Maple Hudson, MD, [facility], FACP Diplomate, American Board of Sleep Medicine    CDY/MEDQ  D:  03/25/2013 15:33:50  T:  03/25/2013 69:62:95  Job:  [id number]

## 2013-04-12 ENCOUNTER — Encounter (HOSPITAL_BASED_OUTPATIENT_CLINIC_OR_DEPARTMENT_OTHER): Payer: BC Managed Care – PPO

## 2013-06-20 ENCOUNTER — Ambulatory Visit (INDEPENDENT_AMBULATORY_CARE_PROVIDER_SITE_OTHER): Payer: BC Managed Care – PPO | Admitting: Cardiology

## 2013-06-20 ENCOUNTER — Encounter: Payer: Self-pay | Admitting: Cardiology

## 2013-06-20 VITALS — BP 126/60 | HR 84 | Ht 66.0 in | Wt 246.1 lb

## 2013-06-20 DIAGNOSIS — I5181 Takotsubo syndrome: Secondary | ICD-10-CM

## 2013-06-20 DIAGNOSIS — G4733 Obstructive sleep apnea (adult) (pediatric): Secondary | ICD-10-CM | POA: Insufficient documentation

## 2013-06-20 DIAGNOSIS — I10 Essential (primary) hypertension: Secondary | ICD-10-CM

## 2013-06-20 DIAGNOSIS — I469 Cardiac arrest, cause unspecified: Secondary | ICD-10-CM

## 2013-06-20 DIAGNOSIS — I639 Cerebral infarction, unspecified: Secondary | ICD-10-CM | POA: Insufficient documentation

## 2013-06-20 DIAGNOSIS — R5381 Other malaise: Secondary | ICD-10-CM

## 2013-06-20 DIAGNOSIS — Z9989 Dependence on other enabling machines and devices: Secondary | ICD-10-CM

## 2013-06-20 DIAGNOSIS — E785 Hyperlipidemia, unspecified: Secondary | ICD-10-CM | POA: Insufficient documentation

## 2013-06-20 DIAGNOSIS — E669 Obesity, unspecified: Secondary | ICD-10-CM

## 2013-06-20 NOTE — Progress Notes (Signed)
PATIENT: Nicholas Potts MRN: [id number]  DOB: [birth year]   DOV:06/23/2013 PCP: Pcp Not In System  Clinic Note: Chief Complaint  Patient presents with  . New Evaluation    SURGERY 12/2012 ACHILLES TENDON -HAD PROBLEMS A CARDIAC ARREST - HEART PUMP IN ICU ;NO CHEST PIAN, NO SOB, NO EDEMA    HPI: Nicholas Potts is a 53 y.o.  male with a PMH below who presents today for delayed cardiology followup after a hospitalization back in July 2014 during which he suffered severe shock and stress-induced cardiomyopathy associated with hypoxic respiratory arrest in the setting of accidental narcotic overdose following surgery. Dr. Jacalyn Lefevre consultation note dictated the patient was initially evaluated for shock after the episode of cardiac arrest with intubation.  This occurred shortly after going home from Achilles tendon repair, when he took oxycodone pain medication down unresponsive and foaming at the mouth. There was no complaint of dyspnea prior to this or any chest tightness or pressure he is known to be in PEA arrest which would probably be more hypoxic respiratory failure. He was for pulmonary embolus and was taken to the Cath Lab and ruled out for coronary disease. While in the Cath Lab, an Impella was placed for left ventricular support. Serial echocardiograms done over the next couple days EF, and exerts himself a lot, and he does have a lot of orthopnea which sounds more related to his obstructive sleep apnea been orthopnea. No PND. No edema 2 weeks, VF was activated and 55-60 %. He was discharged from the hospital to have on carvedilol, he had followup with nephrology for acute renal failure as well as her primary physician, but did not have followup scheduled with Dr. Stanford Breed or Dr. Burt Knack from the [place] office of [facility]. He is now referred by Dr. Jimmye Norman for establishment of cardiology care the events of July.  Interval History: He is and as a backup from his  hospitalization, still remains relatively fatigued. He did well in rehabilitation and was discharged. He is now going back to work, and really feels quite fatigued in the day. He denies any real dyspnea unless he really is exercising a lot. He notes some orthopnea that seems more related to his OSA than heart failure. He denies any true PND symptoms or edema. No chest tightness or pressure with rest or exertion. No rapid or irregular heartbeats. No lightheadedness no wheezes, dizziness or syncope/near syncope. No TIA or amaurosis fugax symptoms. No claudication. No melena, hematochezia or hematuria.  Past Medical History  Diagnosis Date  . Hyperlipidemia   . OSA (obstructive sleep apnea)   . CVA (cerebral infarction)   . 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 Hypertension     recovered acute on chronic renal failure,  History of cardiorespiratory arrest with ventricular tachycardia and PEA do to hypoxic respiratory failure.  Prior Cardiac Evaluation and Past Surgical History: Past Surgical History  Procedure Laterality Date  . Achilles tendon repair    . Insertion of dialysis catheter Right 01/09/2013    Procedure: INSERTION OF DIALYSIS CATHETER;  Surgeon: Angelia Mould, MD;  Location: [facility] OR;  Service: Vascular;  Laterality: Right;  . Cardiac catheterization  12/29/2012    RN LHC --> Impella. for severe shock with low EF. Mild to moderate RV pressure elevation. Normal coronary arteries.  Cardiac Output 2.5, Index 1.46.  . Transthoracic echocardiogram  12/29/2012    EF 25% with periapical HK/AKA.  --> Followup echo 7/25 -- EF of 35%  . Transthoracic echocardiogram  01/11/2013    EF 55-60%. No regional WMA. Grade 1 diastolic dysfunction. Mild LA dilatation.    No Known Allergies  Current  Outpatient Prescriptions  Medication Sig Dispense Refill  . aspirin 325 MG tablet Take 325 mg by mouth daily.      . carvedilol (COREG) 6.25 MG tablet Take 1 tablet (6.25 mg total) by mouth 2 (two) times daily with a meal.  60 tablet  0  . Multiple Vitamins-Minerals (ONE-A-DAY 50 PLUS) TABS Take 1 tablet by mouth daily.      . simvastatin (ZOCOR) 20 MG tablet Take 20 mg by mouth daily.       No current facility-administered medications for this visit.   History   Social History Narrative   He is widowed for 13 years, and has 2 daughters: [name] & [name], and one son [name]. the other history is daughter who is here with him today.    He works for [employer] and [employer] as a [employer].   He quit smoking in 1988. He does drink alcohol socially, but he does have a history of heavy use in the past. He also has a prior history of cocaine use but none since 2000.   family history includes Cancer in his father, maternal grandmother, mother, and paternal grandmother.  ROS: A comprehensive Review of Systems - Negative except Fatigue, some shortness of breath with lying down. Tolerating CPAP well.; The leg seems to feel well.  PHYSICAL EXAM BP 126/60  Pulse 84  Ht 5\' 6"  (1.676 m)  Wt 246 lb 1.6 oz (111.63 kg)  BMI 39.74 kg/m2 General appearance: alert, cooperative, appears stated age, no distress and moderately obese Neck: no adenopathy, no carotid bruit, no JVD and supple, symmetrical, trachea midline Lungs: clear to auscultation bilaterally, normal percussion bilaterally and Good air movement. Nonlabored Heart: regular rate and rhythm, S1, S2 normal, no murmur, click, rub or gallop and normal apical impulse Abdomen: soft, non-tender; bowel sounds normal; no masses,  no organomegaly and Obese Extremities: extremities normal, atraumatic, no cyanosis or edema Pulses: 2+ and symmetric Neurologic: Alert and oriented X 3, normal strength and  tone. Normal symmetric reflexes. Normal coordination and gait  [id number] today: Yes Rate:84 , Rhythm: NSR; normal EKG.;    Recent Labs: None since August  ASSESSMENT / PLAN: Stress-induced cardiomyopathy -- essentially resolved EF essentially now back to baseline. No active heart failure symptoms. He is on carvedilol which I think is cardioprotective he should continue carvedilol, he does not need to be on ACE inhibitor or ARB because his EF is back to normal, which would suggest resolution of his cardiomyopathy.  Most likely had stress could not be related to hypoxic respiratory failure in the setting of an unintentional narcotic overdose leading to cardiac arrest. The rapid resolution of his reduced ejection fraction clearly is consistent with Takotsubo Type cardiomyopathy that is now resolved. He is potentially at risk in the future a single episode, therefore we'll keep him on a beta blocker.  HTN (hypertension) Well-controlled on carvedilol. Continue beta blocker.  OSA on CPAP He seems to be doing OK with the CPAP for his sleep apnea. This is important for long term maintenance.  Physical deconditioning He still seems relatively worn-out entire finally getting back to  work. He is not quite ready to get a detailed exercise. He had a relatively significant event that is not expected to take a while to recover. He just needs to get back into the his pain. Once he is able to get himself back established at work, and he can start getting back to an exercise regimen.  Obesity (BMI 30-39.9) 1 to get established at work, he needs to get into an exercise routine. Postoperative course of dietary modification. We will discuss weight loss again and exercise when I see him back in 6 months.  Hyperlipidemia On statin. Follow up with PCP.    Orders Placed This Encounter  Procedures  . EKG 12-Lead   No orders of the defined types were placed in this encounter.    Followup: 6  months  Kassidy Dockendorf W. Ellyn Hack, M.D., M.S. [facility] [address] [place], [place]  [address]  [phone] Pager # [phone]

## 2013-06-20 NOTE — Patient Instructions (Addendum)
WE WILL DISCUSS WEIGHT LOSS AT NEXT APPOINTMENT.   CONTINUE WITH CURRENT MEDICATION    Your physician wants you to follow-up in 6 MONTHS DR HARDING.--30 MIN APPTS  You will receive a reminder letter in the mail two months in advance. If you don't receive a letter, please call our office to schedule the follow-up appointment.

## 2013-06-21 ENCOUNTER — Encounter: Payer: Self-pay | Admitting: Cardiology

## 2013-06-23 ENCOUNTER — Encounter: Payer: Self-pay | Admitting: Cardiology

## 2013-06-23 DIAGNOSIS — E669 Obesity, unspecified: Secondary | ICD-10-CM | POA: Insufficient documentation

## 2013-06-23 NOTE — Assessment & Plan Note (Signed)
Well-controlled on carvedilol. Continue beta blocker.

## 2013-06-23 NOTE — Assessment & Plan Note (Signed)
1 to get established at work, he needs to get into an exercise routine. Postoperative course of dietary modification. We will discuss weight loss again and exercise when I see him back in 6 months.

## 2013-06-23 NOTE — Assessment & Plan Note (Addendum)
EF essentially now back to baseline. No active heart failure symptoms. He is on carvedilol which I think is cardioprotective he should continue carvedilol, he does not need to be on ACE inhibitor or ARB because his EF is back to normal, which would suggest resolution of his cardiomyopathy.  Most likely had stress could not be related to hypoxic respiratory failure in the setting of an unintentional narcotic overdose leading to cardiac arrest. The rapid resolution of his reduced ejection fraction clearly is consistent with Takotsubo Type cardiomyopathy that is now resolved. He is potentially at risk in the future a single episode, therefore we'll keep him on a beta blocker.

## 2013-06-23 NOTE — Assessment & Plan Note (Signed)
On statin. Follow up with PCP.

## 2013-06-23 NOTE — Assessment & Plan Note (Signed)
He seems to be doing OK with the CPAP for his sleep apnea. This is important for long term maintenance.

## 2013-06-23 NOTE — Assessment & Plan Note (Signed)
He still seems relatively worn-out entire finally getting back to work. He is not quite ready to get a detailed exercise. He had a relatively significant event that is not expected to take a while to recover. He just needs to get back into the his pain. Once he is able to get himself back established at work, and he can start getting back to an exercise regimen.

## 2014-05-17 ENCOUNTER — Encounter (HOSPITAL_COMMUNITY): Payer: Self-pay | Admitting: Cardiovascular Disease

## 2014-07-02 ENCOUNTER — Telehealth: Payer: Self-pay | Admitting: Cardiology

## 2014-07-02 NOTE — Telephone Encounter (Signed)
Close encounter 

## 2014-07-13 ENCOUNTER — Ambulatory Visit (INDEPENDENT_AMBULATORY_CARE_PROVIDER_SITE_OTHER): Payer: BLUE CROSS/BLUE SHIELD | Admitting: Cardiology

## 2014-07-13 ENCOUNTER — Encounter: Payer: Self-pay | Admitting: Cardiology

## 2014-07-13 VITALS — BP 116/60 | HR 74 | Ht 67.0 in | Wt 247.6 lb

## 2014-07-13 DIAGNOSIS — I5181 Takotsubo syndrome: Secondary | ICD-10-CM

## 2014-07-13 DIAGNOSIS — Z9989 Dependence on other enabling machines and devices: Secondary | ICD-10-CM

## 2014-07-13 DIAGNOSIS — E785 Hyperlipidemia, unspecified: Secondary | ICD-10-CM

## 2014-07-13 DIAGNOSIS — G4733 Obstructive sleep apnea (adult) (pediatric): Secondary | ICD-10-CM

## 2014-07-13 DIAGNOSIS — R5381 Other malaise: Secondary | ICD-10-CM

## 2014-07-13 DIAGNOSIS — I10 Essential (primary) hypertension: Secondary | ICD-10-CM

## 2014-07-13 DIAGNOSIS — E669 Obesity, unspecified: Secondary | ICD-10-CM

## 2014-07-13 DIAGNOSIS — I469 Cardiac arrest, cause unspecified: Secondary | ICD-10-CM

## 2014-07-13 MED ORDER — CARVEDILOL 3.125 MG PO TABS: 3.1250 mg | ORAL_TABLET | Freq: Two times a day (BID) | ORAL | Status: DC

## 2014-07-13 NOTE — Patient Instructions (Signed)
Your physician has recommended you make the following change in your medication: carvedilol has been decreased to 3.125 mg tablet daily. A new prescription has been sent to the pharmacy.  Your physician wants you to follow-up in: 6  Months with PA and 1 year with Dr. Ellyn Hack. You will receive a reminder letter in the mail two months in advance. If you don't receive a letter, please call our office to schedule the follow-up appointment.

## 2014-07-15 ENCOUNTER — Encounter: Payer: Self-pay | Admitting: Cardiology

## 2014-07-15 NOTE — Assessment & Plan Note (Signed)
The patient understands the need to lose weight with diet and exercise. We have discussed specific strategies for this.  

## 2014-07-15 NOTE — Assessment & Plan Note (Signed)
He probably really doesn't have hypertension because his blood pressure still control on low-dose beta blocker. Reverting to 3.125 mg twice a day

## 2014-07-15 NOTE — Assessment & Plan Note (Signed)
He needs to get back into exercise.  I think he may be a bit scared to do anything, so I re-iterated that with his recovery of cardiac function, he should be OK to exercise.

## 2014-07-15 NOTE — Progress Notes (Signed)
PATIENT: Nicholas Potts MRN: [id number]  DOB: [birth year]   DOV:07/15/2014 PCP: Pcp Not In System  Clinic Note: Chief Complaint  Patient presents with  . Annual Exam    patient stopped his simvastatin 6 months ago. patient reports having no problems since his last visit.   HPI: Nicholas Potts is a 54 y.o.  male with a PMH notable for severe nonischemic likely stress-induced cardiomyopathy in the setting of severe shock from the acute bouts of respiratory failure and PEA arrest secondary to accidental narcotic overdose back in July 2014. He was supported with Impella and was noted to have gradual improvement of his EF back to essentially normal baseline. He was put to see me back in the summer of last year, but did not come. She has really stopped taking some of his medications including simvastatin, and has reduced his Crestor to once daily.  Interval History: He presents today without any major complaints.  His activity levels really limited by back pain. He does feel a bit winded if he gets out and exercises, but denies chest tightness or pressure. He denies any D. Orthopnea but does have mild dependent edema. He denies any rapid or irregular heartbeat/palpitations. No syncope/near-syncope or TIA/amaurosis fugax symptoms.  His daughter is concerned because his getting back into his bad habits of drinking alcohol. He says what really phase I beer at a time several days a week. His daughter then recognizes that one beer is not the same as one 40 ounce malt liquor.  She is concerned about him taking medications while drinking alcohol. He has not yet gone back to work.    Past Medical History  Diagnosis Date  . Hyperlipidemia   . OSA (obstructive sleep apnea)   . CVA (cerebral infarction)   . Hypertension   . Stress-induced cardiomyopathy -- essentially resolved     Post-op Narcotic induced Acute Hypoxic respiratory failure with respiratory and PEA cardiac arrest following accidental  narcotic overdose; initial [place] 20-20%, now improved to 55%   Was supported with Impella Cardiac Cath - non-obstructive CAD    Recovered acute on chronic renal failure,  History of cardiorespiratory arrest with ventricular tachycardia and PEA do to hypoxic respiratory failure.  Prior Cardiac Evaluation and Past Surgical History: Past Surgical History  Procedure Laterality Date  . Achilles tendon repair    . Insertion of dialysis catheter Right 01/09/2013    Procedure: INSERTION OF DIALYSIS CATHETER;  Surgeon: Angelia Mould, MD;  Location: [facility] OR;  Service: Vascular;  Laterality: Right;  . Cardiac catheterization  12/29/2012    RN LHC --> Impella. for severe shock with low EF. Mild to moderate RV pressure elevation. Normal coronary arteries. Cardiac Output 2.5, Index 1.46.  . Transthoracic echocardiogram  12/29/2012    EF 25% with periapical HK/AKA.  --> Followup echo 7/25 -- EF of 35%  . Transthoracic echocardiogram  01/11/2013    EF 55-60%. No regional WMA. Grade 1 diastolic dysfunction. Mild LA dilatation.  . Left and right heart catheterization with coronary angiogram  12/29/2012    Procedure: LEFT AND RIGHT HEART CATHETERIZATION WITH CORONARY ANGIOGRAM;  Surgeon: Sherren Mocha, MD;  Location: [facility] CATH LAB;  Service: Cardiovascular;;    No Known Allergies  Current Outpatient Prescriptions  Medication Sig Dispense Refill  . aspirin 325 MG tablet Take 325 mg by mouth daily.    . Multiple Vitamins-Minerals (ONE-A-DAY 50 PLUS) TABS Take 1 tablet by mouth daily.    . carvedilol (COREG) 3.125 MG  tablet Take 1 tablet (3.125 mg total) by mouth 2 (two) times daily. 60 tablet 11   No current facility-administered medications for this visit.   History   Social History Narrative   He is widowed for 13 years, and has 2 daughters: Nicholas Potts & [address] Nicholas Potts, and one son Nicholas Potts. the other history is daughter who is here with him today.    He  works for [employer] and [employer] as a [employer].   He quit smoking in 1988. He does drink alcohol socially, but he does have a history of heavy use in the past. He also has a prior history of cocaine use but none since 2000.   family history includes Cancer in his father, maternal grandmother, mother, and paternal grandmother.  ROS: A comprehensive Review of Systems - was performed Review of Systems  Constitutional: Positive for malaise/fatigue (Just lack of energy.).  HENT: Negative for nosebleeds.   Cardiovascular:       Per history of present illness  Gastrointestinal: Negative for blood in stool and melena.  Genitourinary: Negative for hematuria.  Musculoskeletal: Positive for back pain (With radiculopathy).  Neurological: Positive for dizziness (Occasional positional). Negative for focal weakness, seizures and loss of consciousness.  Endo/Heme/Allergies: Does not bruise/bleed easily.  Psychiatric/Behavioral: Positive for depression (probably has some dysthymia.; Not really do much work.).       Increased alcohol consumption  All other systems reviewed and are negative.   PHYSICAL EXAM BP 116/60 mmHg  Pulse 74  Ht 5\' 7"  (1.702 m)  Wt 247 lb 9.6 oz (112.311 kg)  BMI 38.77 kg/m2 General appearance: alert, cooperative, appears stated age, no distress and moderately obese Neck: no adenopathy, no carotid bruit, no JVD and supple, symmetrical, trachea midline Lungs: clear to auscultation bilaterally, normal percussion bilaterally and Good air movement. Nonlabored Heart: regular rate and rhythm, S1, S2 normal, no murmur, click, rub or gallop and normal apical impulse Abdomen: soft, non-tender; bowel sounds normal; no masses,  no organomegaly and Obese Extremities: extremities normal, atraumatic, no cyanosis or edema Pulses: 2+ and symmetric Neurologic: Alert and oriented X 3, normal strength and tone. Normal symmetric reflexes. Normal coordination and gait  UUE:[id number] today:  Yes Rate:74 , Rhythm: NSR; normal EKG.;    Recent Labs: None recently checked  ASSESSMENT / PLAN: Stress-induced cardiomyopathy -- essentially resolved By echocardiogram, his EF is basically back to normal baseline. I am still not convinced that he has fully recovered from a significant social and emotional stress. He has not gotten back to his baseline activities.  He is taking carvedilol, but not according to appropriate dosing. I will switch him to 3.25 mg twice a day since his blood pressure is well-controlled only taking 6.25 once a day He does not have significant edema and therefore does not need a diuretic. I will keep him on the beta blocker simply to avoid recurrence or worsening EF.   Essential hypertension He probably really doesn't have hypertension because his blood pressure still control on low-dose beta blocker. Reverting to 3.125 mg twice a day   Hyperlipidemia Is no longer taking a statin. I have not seen any labs recently. Would defer to PCP.   OSA on CPAP I. February the need to continue CPAP.   Physical deconditioning He needs to get back into exercise.  I think he may be a bit scared to do anything, so I re-iterated that with his recovery of cardiac function, he should be OK  to exercise.   Obesity (BMI 30-39.9) The patient understands the need to lose weight with diet and exercise. We have discussed specific strategies for this.     I also thought that the importance of backing off on his alcohol intake. Indicating that a 40 ounce molecular is not same as one year. If he wants to drink half of 1 40Oz, that would be more acceptable.  However, he should not do so while taking medications.  Orders Placed This Encounter  Procedures  . EKG 12-Lead   Meds ordered this encounter  Medications  . carvedilol (COREG) 3.125 MG tablet    Sig: Take 1 tablet (3.125 mg total) by mouth 2 (two) times daily.    Dispense:  60 tablet    Refill:  11    Followup: 6  months   HARDING, Leonie Green, M.D., M.S. Interventional Cardiologist   Pager # [phone]

## 2014-07-15 NOTE — Assessment & Plan Note (Signed)
Is no longer taking a statin. I have not seen any labs recently. Would defer to PCP.

## 2014-07-15 NOTE — Assessment & Plan Note (Signed)
By echocardiogram, his EF is basically back to normal baseline. I am still not convinced that he has fully recovered from a significant social and emotional stress. He has not gotten back to his baseline activities.  He is taking carvedilol, but not according to appropriate dosing. I will switch him to 3.25 mg twice a day since his blood pressure is well-controlled only taking 6.25 once a day He does not have significant edema and therefore does not need a diuretic. I will keep him on the beta blocker simply to avoid recurrence or worsening EF.

## 2014-07-15 NOTE — Assessment & Plan Note (Signed)
I. February the need to continue CPAP.

## 2014-07-16 ENCOUNTER — Ambulatory Visit: Payer: BC Managed Care – PPO | Admitting: Cardiology

## 2014-08-09 IMAGING — CR DG CHEST 1V PORT
1 series · 1 of 1 positions shown · non-contrast
Comparison: Earlier same day

CLINICAL DATA: Evaluate endotracheal tube and left subclavian line
placement

PORTABLE CHEST - 1 VIEW

[AP]
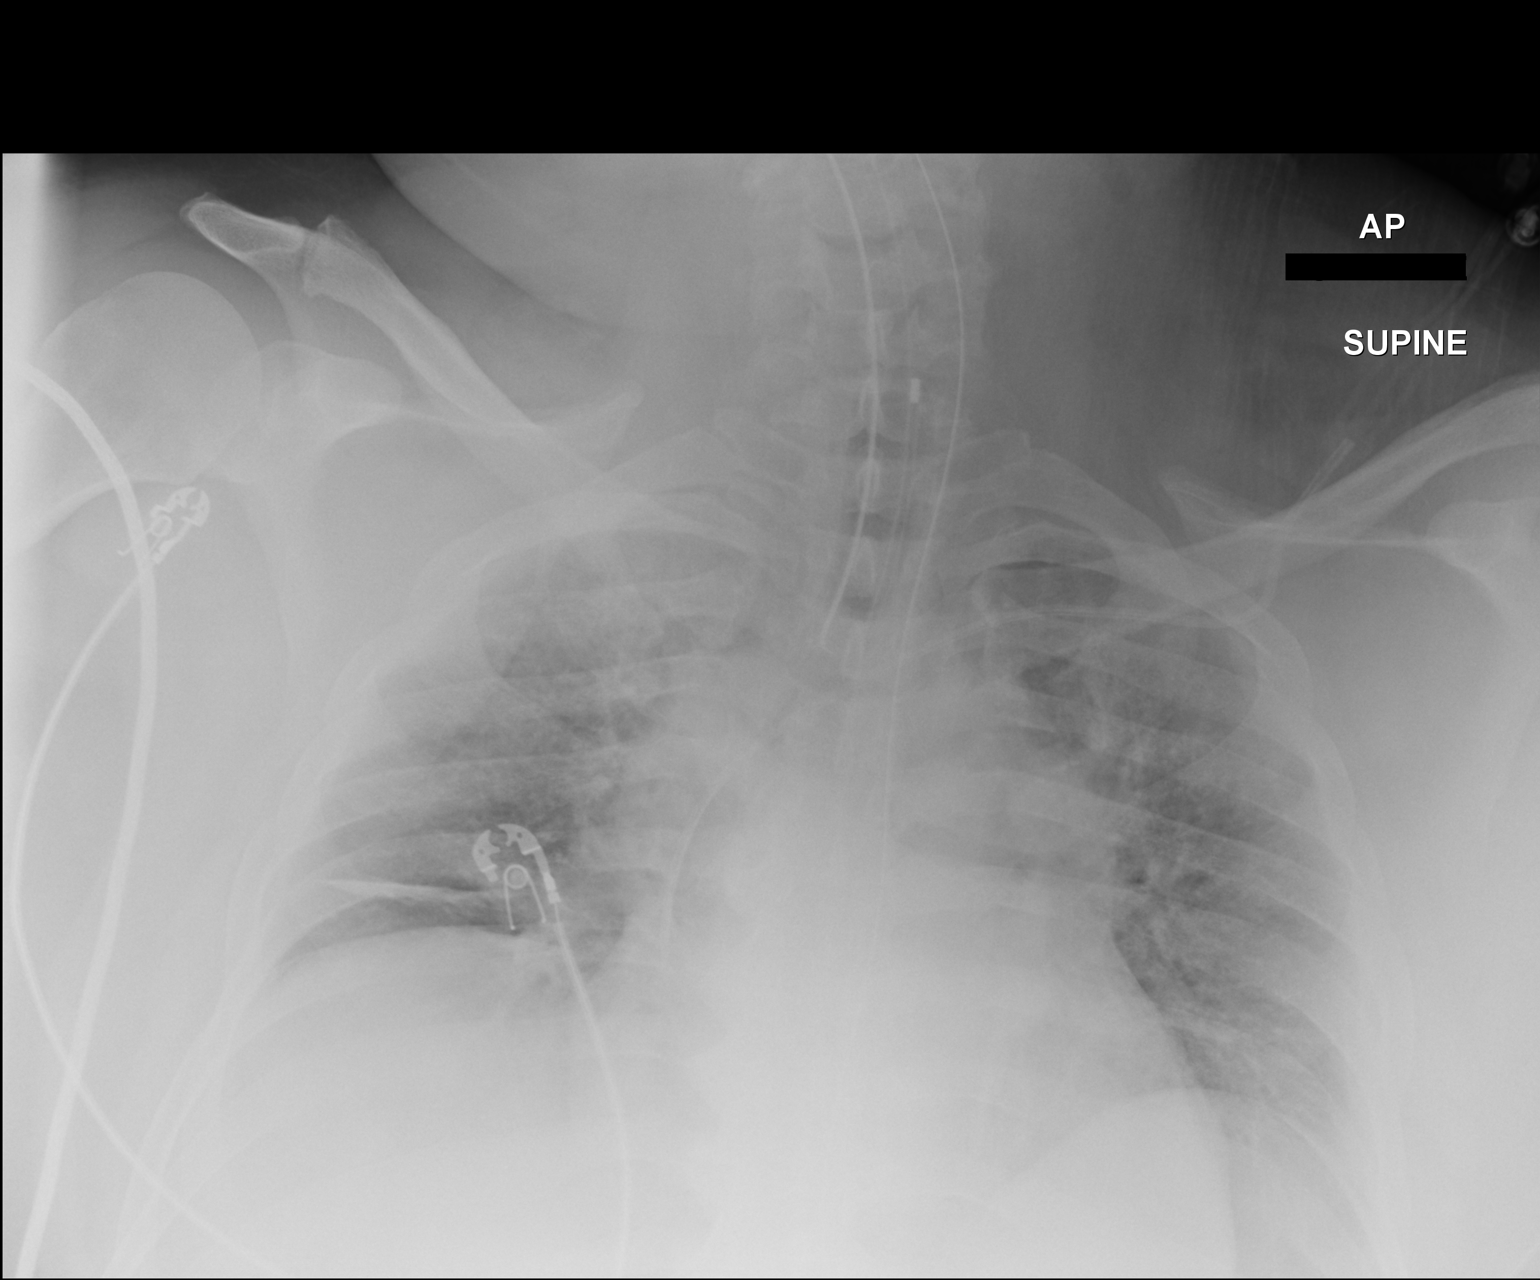

[1 of 1 positions shown; findings below may reference images not displayed]

FINDINGS: Grossly unchanged enlarged cardiac silhouette and mediastinal
contours, possibly attributable to decreased lung volumes and AP
projection.  An endotracheal tube overlies the tracheal air column
with tip superior to the carina.  Interval placement of a left
subclavian vein approach central venous catheter with tip
projecting over the superior cavoatrial junction.  Enteric tube tip
and side port project below the left hemidiaphragm.  No
pneumothorax.  Pulmonary vasculature is indistinct with
cephalization of flow.  Small/trace bilateral effusions are
suspected with minimal amount of fluid layering within the right
minor fissure.  Worsening perihilar opacities.  Unchanged bones.
IMPRESSION: 1.  Appropriately positioned support apparatus as above.  No
pneumothorax.
2.  Suspected pulmonary edema.
3.  Reduced lung volumes with worsening perihilar opacities, likely
atelectasis.  findings suggestive of pulmonary edema

## 2014-08-10 IMAGING — CR DG ABD PORTABLE 1V
1 series · 1 of 1 positions shown · non-contrast
Comparison: None.

CLINICAL DATA: NG tube placement

PORTABLE ABDOMEN - 1 VIEW

[AP]
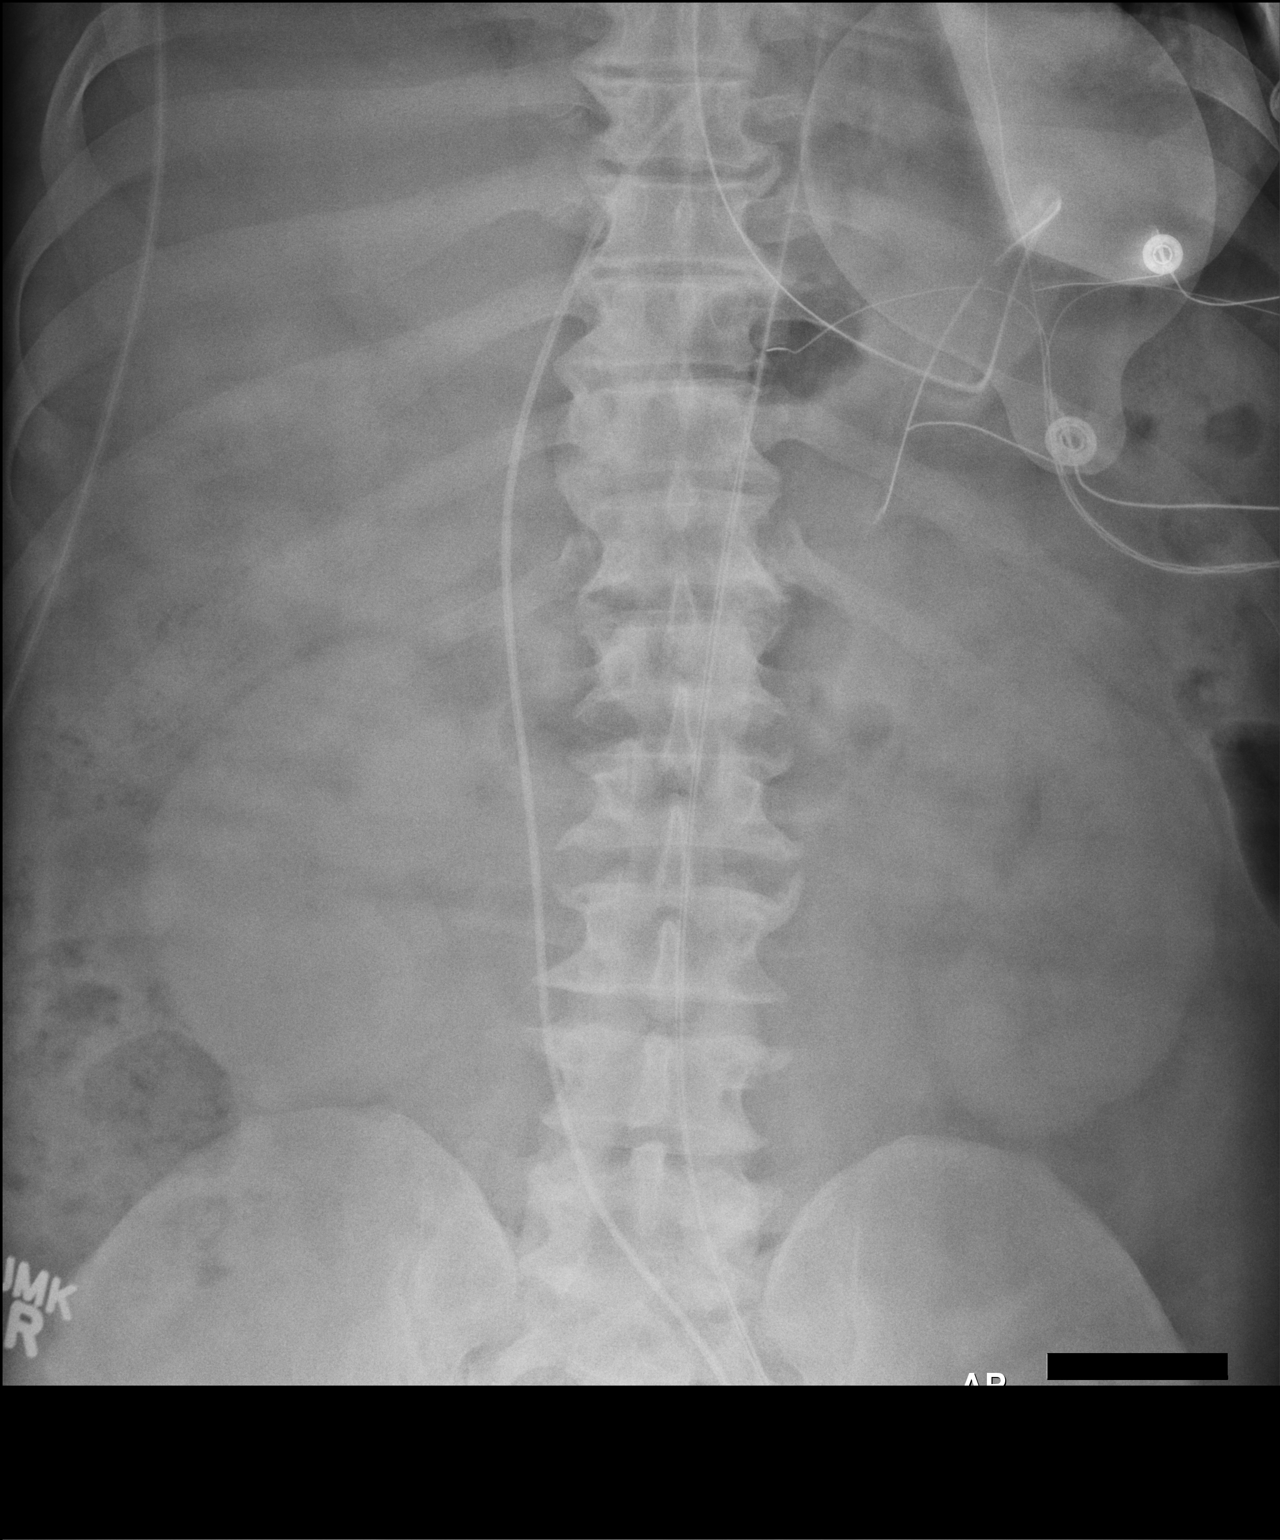

[1 of 1 positions shown; findings below may reference images not displayed]

FINDINGS: The NG tube is curled in the fundus of the stomach.  The
tip is in the body.  No disproportionate dilatation of bowel.
Multiple linear devices project over the abdomen.
IMPRESSION: NG tube is coiled in the stomach

## 2014-08-11 IMAGING — CR DG CHEST 1V PORT
1 series · 1 of 1 positions shown · non-contrast
Comparison: Portable chest x-ray earlier same date 9998 hours.

CLINICAL DATA: Critically ill patient with possible change in
position of one of the indwelling support apparatus, the Impella
device.

PORTABLE CHEST - 1 VIEW [DATE]/7210 1101 hours:

[AP]
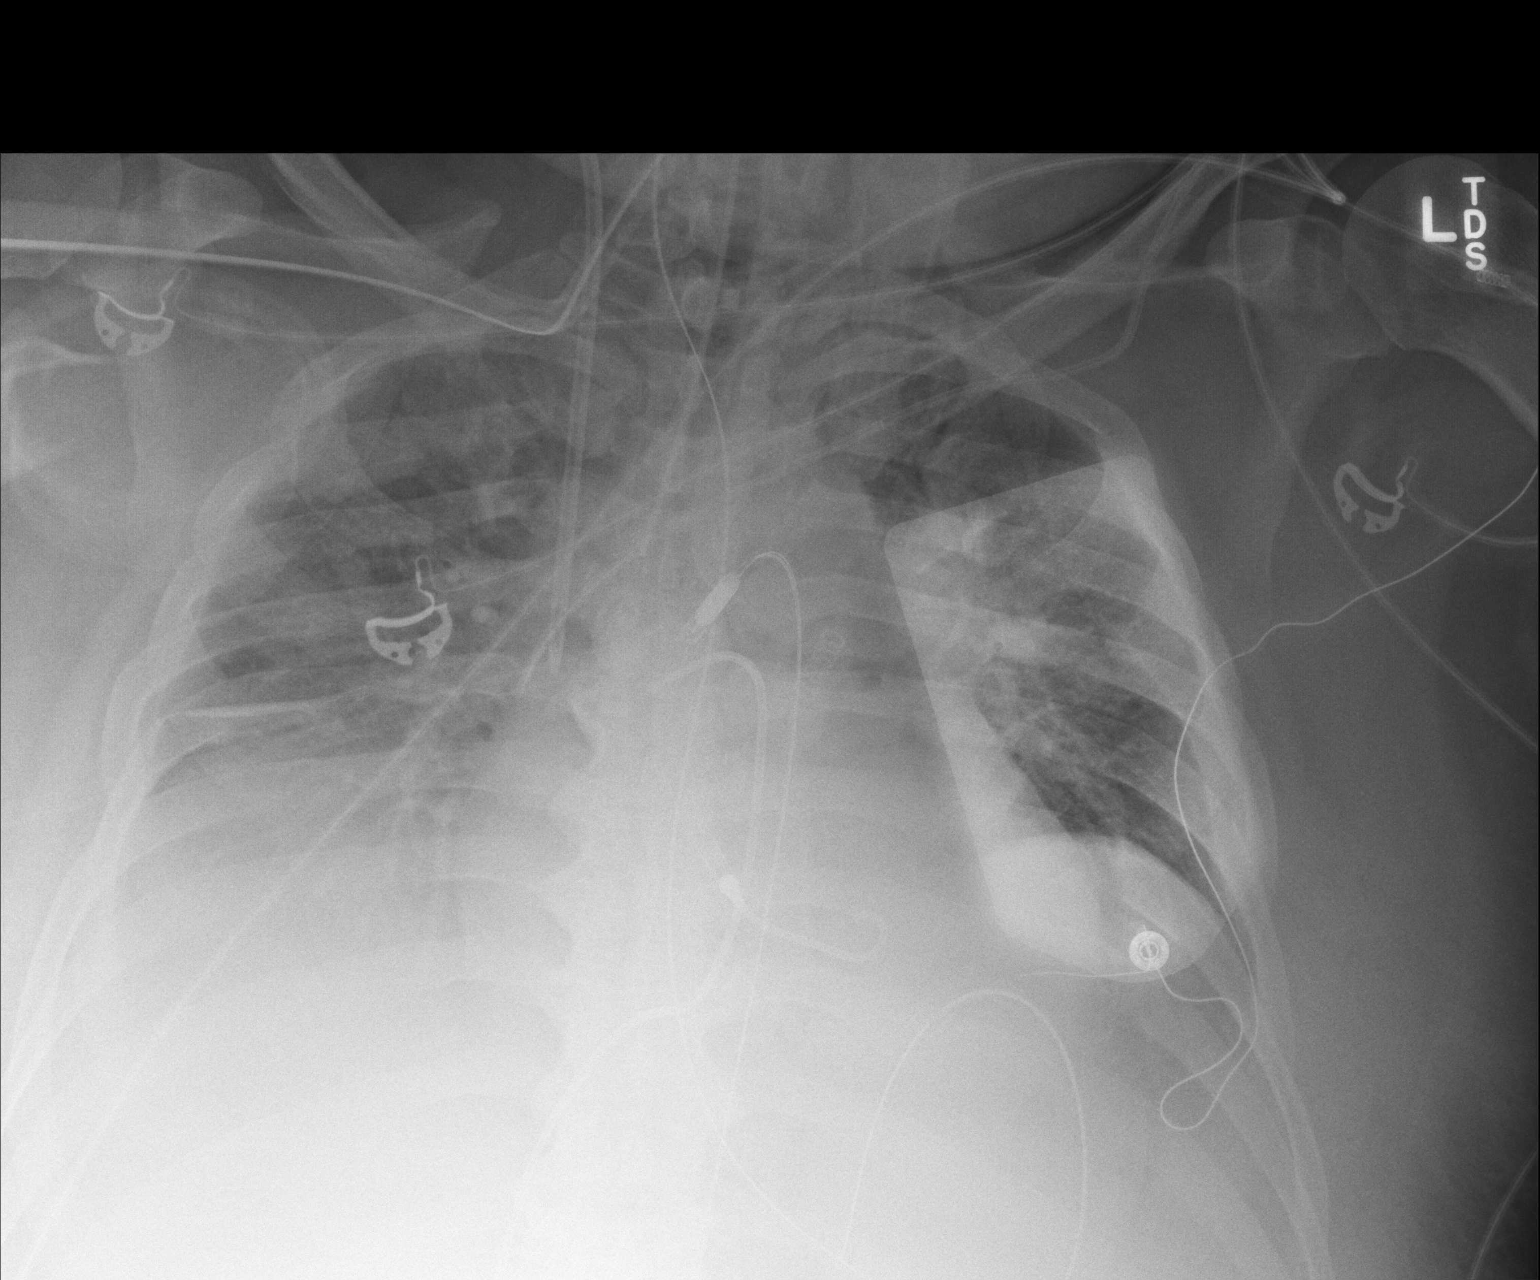

[1 of 1 positions shown; findings below may reference images not displayed]

FINDINGS: The Impella device which has been placed from a femoral
approach is unchanged in position since the examination earlier
today, with the distal hook in the left ventricle and the device
traversing the aortic valve.  Endotracheal tube tip somewhat low,
chest approximately 2 cm above the carina, as noted previously.
Remaining support apparatus satisfactory with the Swan-Ganz
catheter tip overlying the proximal right main pulmonary artery,
the right jugular central venous catheter tip overlying the lower
SVC, the left subclavian central venous catheter tip projecting
over the upper SVC, and the nasogastric tube looped in the stomach.
External pacing pads.  Markedly suboptimal inspiration with
atelectasis in the lower lobes, right greater than left, unchanged.
Pulmonary venous hypertension without overt edema, unchanged.  No
new pulmonary parenchymal abnormalities.
IMPRESSION: 1.  Support apparatus satisfactory with the exception that the
endotracheal tube tip is low, just about 2 cm above the carina.
Specifically, no change in the position of the Impella device since
earlier same date.
2.  Suboptimal inspiration with atelectasis in the lung bases,
right greater than left, unchanged since earlier in the day.
3.  Pulmonary venous hypertension without overt edema, also
unchanged.
4.  No new pulmonary parenchymal abnormalities.

## 2014-08-12 IMAGING — CR DG CHEST 1V PORT
1 series · 1 of 1 positions shown · non-contrast
Comparison: December 31, 2012

CLINICAL DATA: Hypoxia

PORTABLE CHEST - 1 VIEW

[AP]
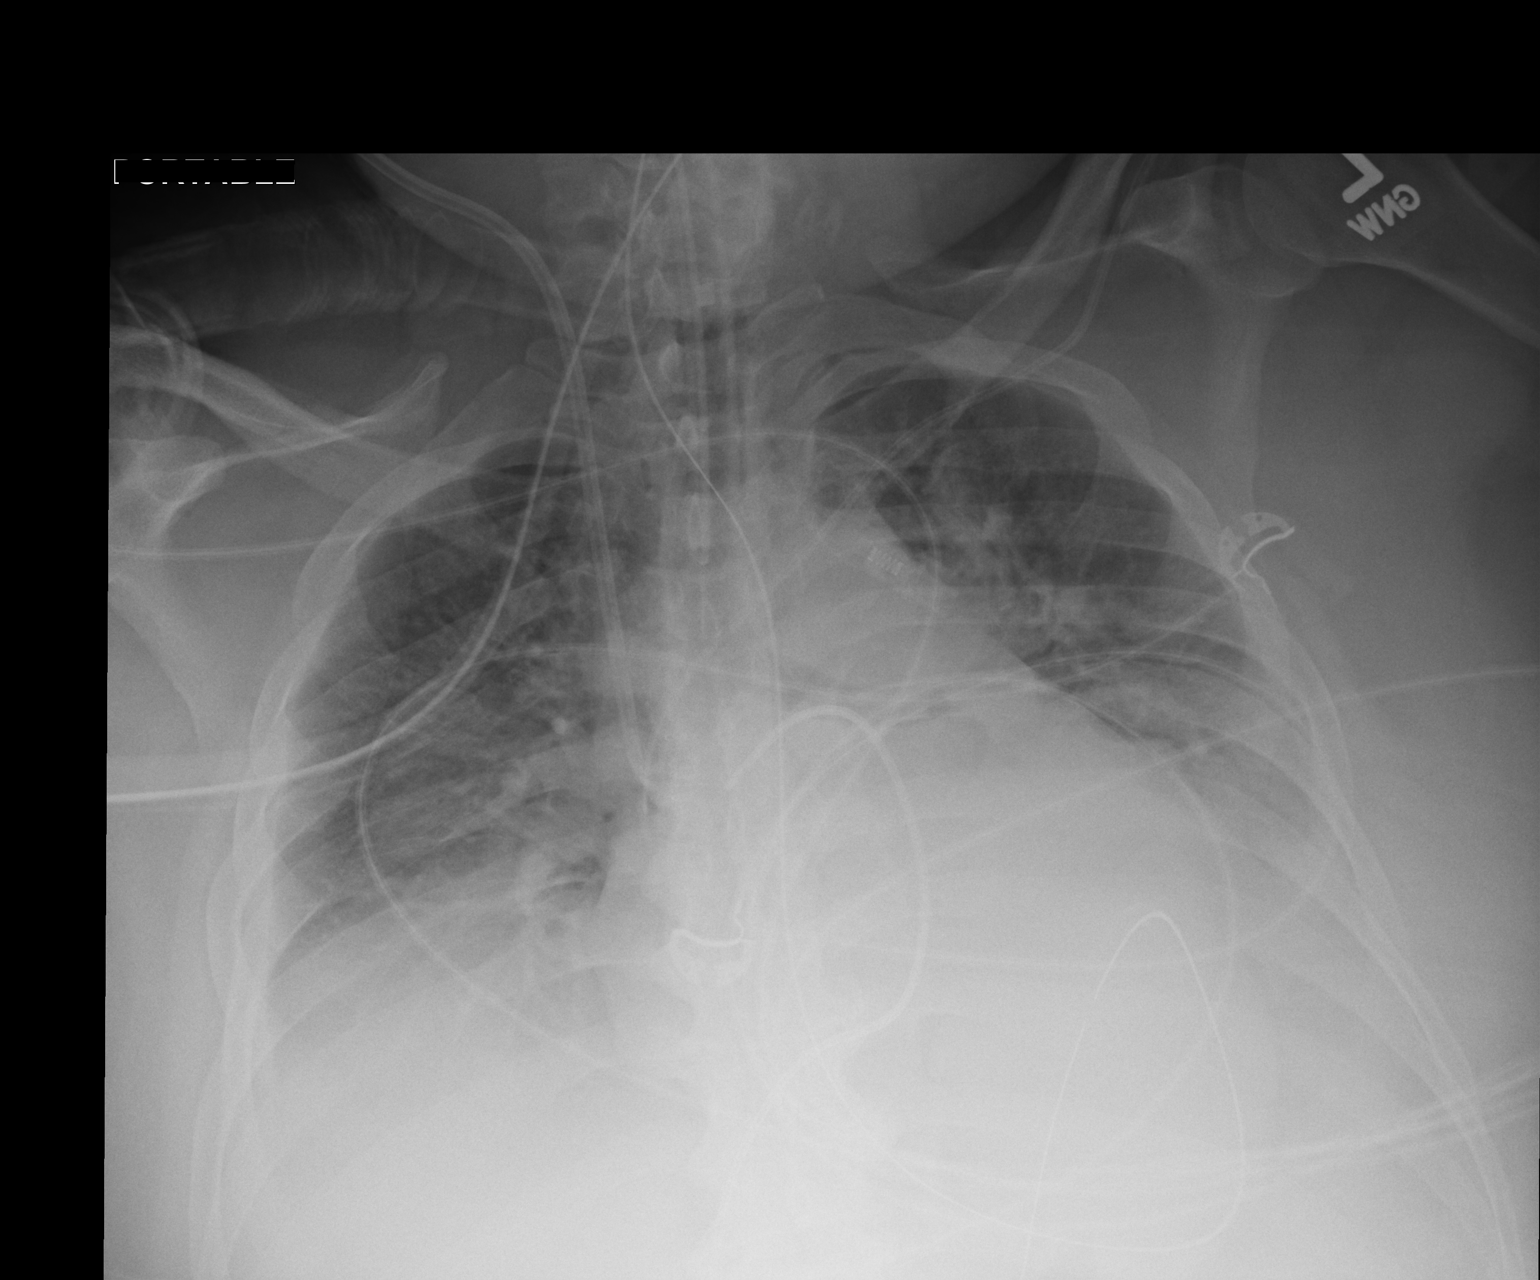

[1 of 1 positions shown; findings below may reference images not displayed]

FINDINGS: Endotracheal tube tip is 2.5 cm above the carina.
Central catheters are unchanged in position. Nasogastric tube tip
and side port are in the stomach region.  No apparent pneumothorax.

There is consolidation in the left lower lobe with small left
effusion.  The right lung is clear.  Heart is borderline prominent
with normal pulmonary vascularity.  No appreciable adenopathy.
IMPRESSION: Tube and catheter positions as described.  No
pneumothorax. There is consolidation in the left lower lobe with
small left effusion.  Right lung clear.

## 2014-08-14 IMAGING — CR DG CHEST 1V PORT
2 series · 2 of 2 positions shown · non-contrast
Comparison: Prior chest x-ray 01/02/2013

CLINICAL DATA: Endotracheal tube placement

PORTABLE CHEST - 1 VIEW

[AP (1 of 2)]
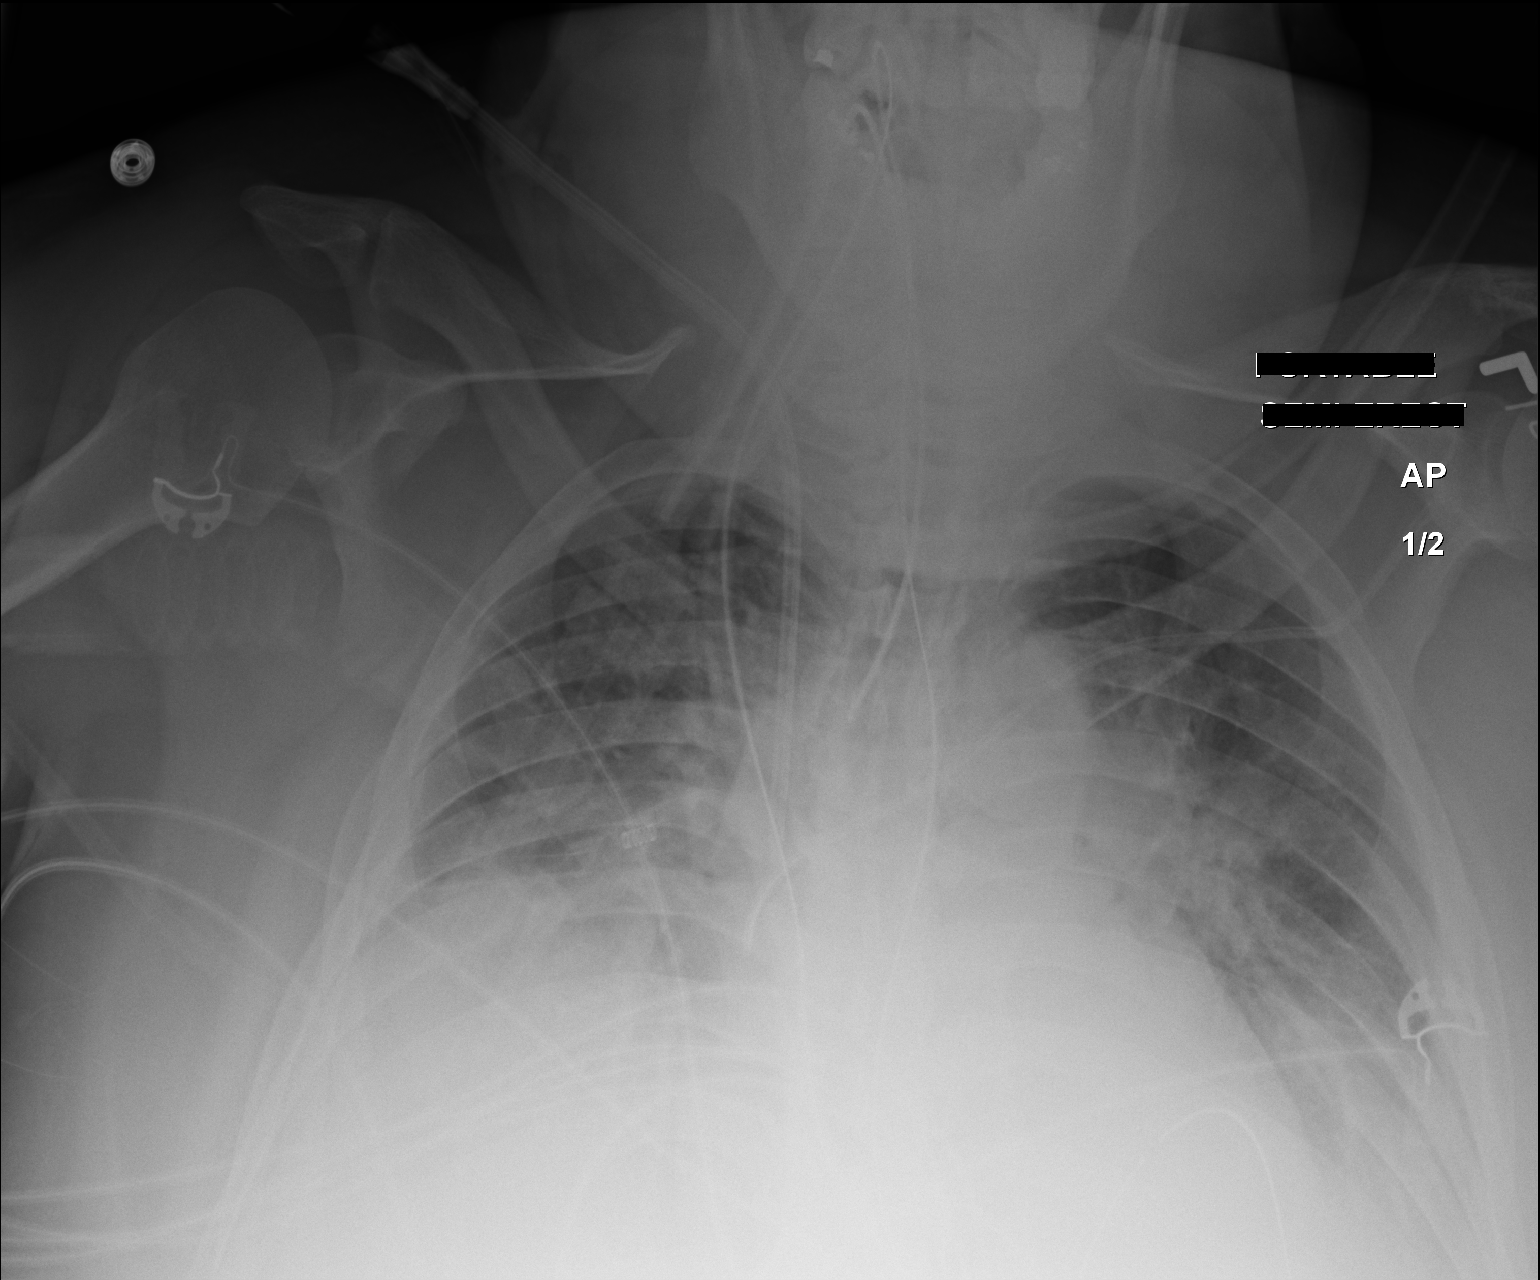

[AP (2 of 2)]
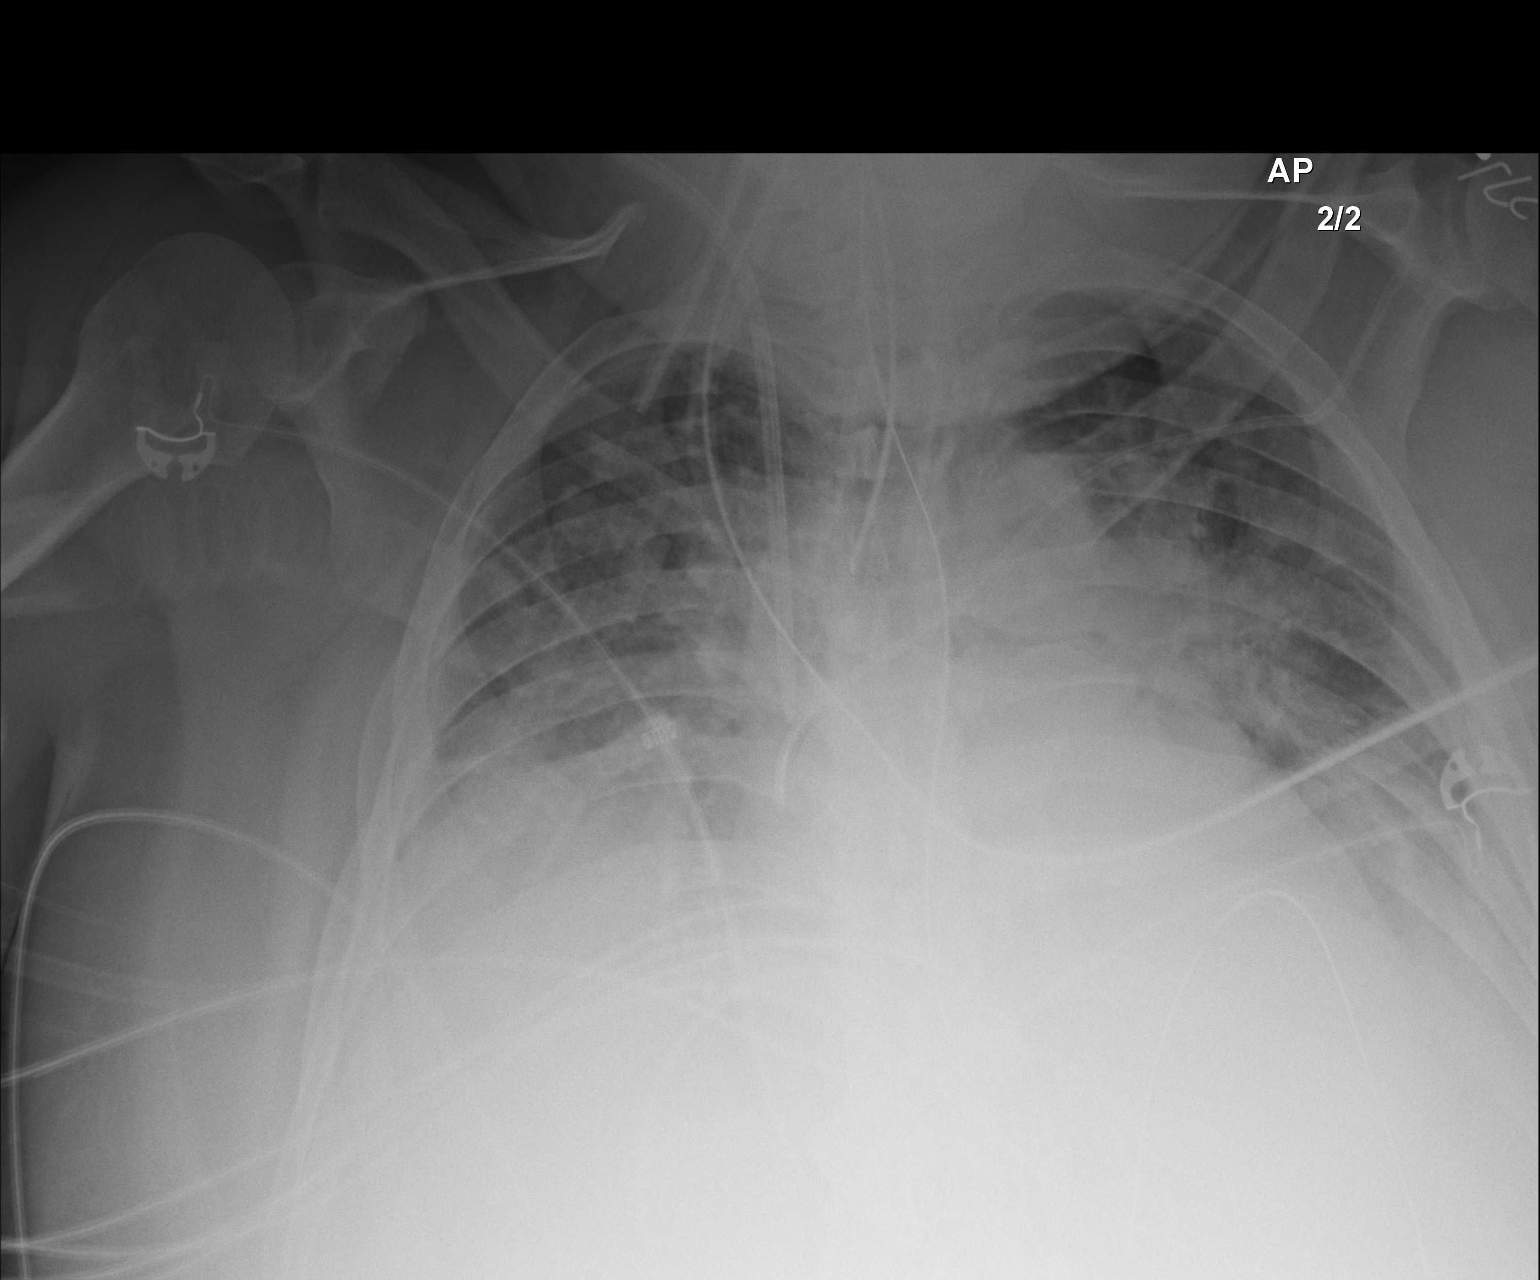

[2 of 2 positions shown; findings below may reference images not displayed]

FINDINGS: The tip of the endotracheal tube is 1.5 cm above the
carina.  The patient is in a kyphotic position.  The right IJ
temporary hemodialysis catheter tip projects over the distal SVC.
The tip of the left subclavian approach central venous catheter
projects over the superior cavoatrial junction. The nasogastric
tube can be seen coiled within the gastric fundus.  Inspiratory
volumes are very low.  There is mild pulmonary edema and diffuse
bilateral interstitial and airspace opacities.  Probable bilateral
layering effusions.  Stable cardiomegaly.
IMPRESSION: 1.  The tip of the endotracheal tube is 1.5 cm above the carina.
Relatively distal placement of the endotracheal tube likely
exaggerated by the patient is kyphotic positioning.
2.  Other support apparatus in stable and satisfactory position.
3.  Similar appearance of the lungs with very low inspiratory
volumes, edema, bilateral layering effusions and infiltrate versus
atelectasis.

## 2015-02-06 ENCOUNTER — Encounter: Payer: Self-pay | Admitting: Physician Assistant

## 2015-02-06 ENCOUNTER — Ambulatory Visit (INDEPENDENT_AMBULATORY_CARE_PROVIDER_SITE_OTHER): Payer: BLUE CROSS/BLUE SHIELD | Admitting: Physician Assistant

## 2015-02-06 VITALS — BP 104/80 | HR 77 | Ht 67.0 in | Wt 244.6 lb

## 2015-02-06 DIAGNOSIS — I10 Essential (primary) hypertension: Secondary | ICD-10-CM | POA: Diagnosis not present

## 2015-02-06 DIAGNOSIS — E669 Obesity, unspecified: Secondary | ICD-10-CM | POA: Diagnosis not present

## 2015-02-06 DIAGNOSIS — Z9989 Dependence on other enabling machines and devices: Secondary | ICD-10-CM

## 2015-02-06 DIAGNOSIS — I5181 Takotsubo syndrome: Secondary | ICD-10-CM

## 2015-02-06 DIAGNOSIS — E785 Hyperlipidemia, unspecified: Secondary | ICD-10-CM | POA: Diagnosis not present

## 2015-02-06 DIAGNOSIS — G4733 Obstructive sleep apnea (adult) (pediatric): Secondary | ICD-10-CM

## 2015-02-06 NOTE — Patient Instructions (Signed)
Medication Instructions:   STOP CARVEDILOL   Labwork:  FASTING LIPID PROFILE AT YOUR CONVENIENCE AT [facility]    Follow-Up:  6 MONTHS WITH DR. HARDING  Any Other Special Instructions Will Be Listed Below (If Applicable).  CALL AND MAKE AN APPOINTMENT TO SEE AMY [place] ,[place]

## 2015-02-06 NOTE — Progress Notes (Signed)
Patient ID: Nicholas Potts, male   DOB: [birth year], 54 y.o.   MRN: [id number]     Date:  02/06/2015   ID:  Nicholas Potts, DOB 06-Aug-1960, MRN [id number]  PCP:  No primary care provider on file.  Primary Cardiologist:  Nicholas Potts  Chief Complaint  Patient presents with  . Follow-up    6 months,     History of Present Illness: Nicholas Potts is a 54 y.o. 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 He was supported with Impella and was noted to have gradual improvement of his EF back to essentially normal baseline.  His last echo was August 2014 with an ejection fraction of 55-60%. Grade 1 diastolic dysfunction. Left atrium was mildly dilated.  Patient presents today for six-month evaluation.  His daughters here and helps translate since he speaks [place]. He does not have any particular complaints.    The patient currently denies nausea, vomiting, fever, chest pain, shortness of breath, orthopnea, dizziness, PND, cough, congestion, abdominal pain, hematochezia, melena, lower extremity edema, claudication.  Wt Readings from Last 3 Encounters:  02/06/15 244 lb 9.6 oz (110.95 kg)  07/13/14 247 lb 9.6 oz (112.311 kg)  06/20/13 246 lb 1.6 oz (111.63 kg)     Past Medical History  Diagnosis Date  . Hyperlipidemia   . OSA (obstructive sleep apnea)   . CVA (cerebral infarction)   . Hypertension   . Stress-induced cardiomyopathy -- essentially resolved     Post-op Narcotic induced Acute Hypoxic respiratory failure with respiratory and PEA cardiac arrest following accidental narcotic overdose; initial [place] 20-20%, now improved to 55%   Was supported with Impella Cardiac Cath - non-obstructive CAD     Current Outpatient Prescriptions  Medication Sig Dispense Refill  . aspirin 325 MG tablet  Take 325 mg by mouth daily.    . Multiple Vitamins-Minerals (ONE-A-DAY 50 PLUS) TABS Take 1 tablet by mouth daily.     No current facility-administered medications for this visit.    Allergies:   No Known Allergies  Social History:  The patient  reports that he quit smoking about 28 years ago. He has never used smokeless tobacco. He reports that he drinks alcohol. He reports that he uses illicit drugs.   Family history:   Family History  Problem Relation Age of Onset  . Cancer Mother     Liver cancer  . Cancer Father     Leukemia  . Cancer Maternal Grandmother     Liver cancer  . Cancer Paternal Grandmother     Colon cancer    ROS:  Please see the history of present illness.  All other systems reviewed and negative.   PHYSICAL EXAM: VS:  BP 104/80 mmHg  Pulse 77  Ht 5\' 7"  (1.702 m)  Wt 244 lb 9.6 oz (110.95 kg)  BMI 38.30 kg/m2 Obese well developed, in no acute distress HEENT: Pupils are equal round react to light accommodation extraocular movements are intact.  Neck: no JVDNo cervical lymphadenopathy. Cardiac: Regular rate and rhythm without murmurs rubs or gallops. Lungs:  clear to auscultation bilaterally, no wheezing, rhonchi or rales Abd: soft, nontender, positive bowel sounds all quadrants, no hepatosplenomegaly Ext: no lower extremity edema.  2+ radial and dorsalis pedis pulses. Skin: warm and dry Neuro:  Grossly normal  EKG:  Normal sinus  rhythm rate 77 bpm  ASSESSMENT AND PLAN:  Problem List Items Addressed This Visit    Stress-induced cardiomyopathy -- essentially resolved - Primary (Chronic)   Relevant Orders   EKG 12-Lead   OSA on CPAP   Obesity (BMI 30-39.9) (Chronic)   Hyperlipidemia (Chronic)   Essential hypertension (Chronic)    Other Visit Diagnoses    Dyslipidemia        Relevant Orders    Lipid panel      Stress-induced cardiomyopathy: Repeat echocardiogram August 2014 showed result improvement in ejection fraction to normal. He appears  euvolemic  Essential hypertension:  Patient is more hypotensive. I'm going to discontinue his Coreg since he is only on 3.125 mg.  Hyperlipidemia: He had a triglyceride level in 2014 which was 381. We'll recheck his lipid panel in the next week or so.  Obstructive sleep apnea on CPAP:  Continue CPAP   Obesity-BMI 30-39.9% We discussed healthy eating and weight loss. His weight has not change in the last 6 months. He will be referred to a registered dietitian for medical nutrition therapy.

## 2015-02-07 LAB — LIPID PANEL
CHOL/HDL RATIO: 5.9 ratio — AB (ref <=5.0)
Cholesterol: 218 mg/dL — ABNORMAL HIGH (ref 125–200)
HDL: 37 mg/dL — ABNORMAL LOW (ref >=40)
LDL CALC: 145 mg/dL — AB (ref <130)
Triglycerides: 182 mg/dL — ABNORMAL HIGH (ref <150)
VLDL: 36 mg/dL — AB (ref <30)

## 2015-06-29 ENCOUNTER — Ambulatory Visit (INDEPENDENT_AMBULATORY_CARE_PROVIDER_SITE_OTHER): Payer: BLUE CROSS/BLUE SHIELD | Admitting: Emergency Medicine

## 2015-06-29 VITALS — BP 118/80 | HR 68 | Temp 98.0°F | Resp 18 | Ht 67.25 in | Wt 246.4 lb

## 2015-06-29 DIAGNOSIS — Z0000 Encounter for general adult medical examination without abnormal findings: Secondary | ICD-10-CM

## 2015-06-29 DIAGNOSIS — Z13220 Encounter for screening for lipoid disorders: Secondary | ICD-10-CM

## 2015-06-29 DIAGNOSIS — Z1211 Encounter for screening for malignant neoplasm of colon: Secondary | ICD-10-CM

## 2015-06-29 DIAGNOSIS — Z114 Encounter for screening for human immunodeficiency virus [HIV]: Secondary | ICD-10-CM

## 2015-06-29 DIAGNOSIS — Z23 Encounter for immunization: Secondary | ICD-10-CM | POA: Diagnosis not present

## 2015-06-29 DIAGNOSIS — R300 Dysuria: Secondary | ICD-10-CM | POA: Diagnosis not present

## 2015-06-29 DIAGNOSIS — Z1159 Encounter for screening for other viral diseases: Secondary | ICD-10-CM | POA: Diagnosis not present

## 2015-06-29 LAB — COMPLETE METABOLIC PANEL WITH GFR
ALBUMIN: 4.3 g/dL (ref 3.6–5.1)
ALK PHOS: 89 U/L (ref 40–115)
ALT: 19 U/L (ref 9–46)
AST: 17 U/L (ref 10–35)
BUN: 12 mg/dL (ref 7–25)
CHLORIDE: 106 mmol/L (ref 98–110)
CO2: 25 mmol/L (ref 20–31)
Calcium: 9 mg/dL (ref 8.6–10.3)
Creat: 0.98 mg/dL (ref 0.70–1.33)
GFR, EST NON AFRICAN AMERICAN: 86 mL/min (ref >=60)
GFR, Est African American: >89 mL/min (ref >=60)
GLUCOSE: 85 mg/dL (ref 65–99)
POTASSIUM: 4.2 mmol/L (ref 3.5–5.3)
Sodium: 137 mmol/L (ref 135–146)
Total Bilirubin: 0.5 mg/dL (ref 0.2–1.2)
Total Protein: 7.3 g/dL (ref 6.1–8.1)

## 2015-06-29 LAB — LIPID PANEL
CHOL/HDL RATIO: 6.9 ratio — AB (ref <=5.0)
Cholesterol: 207 mg/dL — ABNORMAL HIGH (ref 125–200)
HDL: 30 mg/dL — AB (ref >=40)
LDL Cholesterol: 128 mg/dL (ref <130)
Triglycerides: 243 mg/dL — ABNORMAL HIGH (ref <150)
VLDL: 49 mg/dL — AB (ref <30)

## 2015-06-29 LAB — POCT CBC
Granulocyte percent: 70.4 %G (ref 37–80)
HEMATOCRIT: 43.2 % — AB (ref 43.5–53.7)
HEMOGLOBIN: 14.7 g/dL (ref 14.1–18.1)
LYMPH, POC: 2.2 (ref 0.6–3.4)
MCH: 32 pg — AB (ref 27–31.2)
MCHC: 34.2 g/dL (ref 31.8–35.4)
MCV: 93.8 fL (ref 80–97)
MID (cbc): 0.2 (ref 0–0.9)
MPV: 6.5 fL (ref 0–99.8)
POC GRANULOCYTE: 5.6 (ref 2–6.9)
POC LYMPH PERCENT: 27 %L (ref 10–50)
POC MID %: 2.6 % (ref 0–12)
Platelet Count, POC: 279 10*3/uL (ref 142–424)
RBC: 4.6 M/uL — AB (ref 4.69–6.13)
RDW, POC: 13.7 %
WBC: 8 10*3/uL (ref 4.6–10.2)

## 2015-06-29 LAB — POCT URINALYSIS DIP (MANUAL ENTRY)
BILIRUBIN UA: NEGATIVE
BILIRUBIN UA: NEGATIVE
Glucose, UA: NEGATIVE
LEUKOCYTES UA: NEGATIVE
Nitrite, UA: NEGATIVE
PH UA: 6
SPEC GRAV UA: 1.02
Urobilinogen, UA: 0.2

## 2015-06-29 LAB — POC MICROSCOPIC URINALYSIS (UMFC)

## 2015-06-29 LAB — HIV ANTIBODY (ROUTINE TESTING W REFLEX): HIV 1&2 Ab, 4th Generation: NONREACTIVE

## 2015-06-29 LAB — HEPATITIS C ANTIBODY: HCV Ab: NEGATIVE

## 2015-06-29 NOTE — Patient Instructions (Signed)

## 2015-06-29 NOTE — Progress Notes (Addendum)
By signing my name below, I, Moises Blood, attest that this documentation has been prepared under the direction and in the presence of Arlyss Queen, MD. Electronically Signed: Moises Blood, [place]. 06/29/2015 , 10:20 AM .  Patient was seen in room 8 .  Chief Complaint:  Chief Complaint  Patient presents with  . Annual Exam    CPE    HPI: Nicholas Potts is a 55 y.o. male who reports to [facility] today for annual physical. He feels generally well. He denies smoking.  He has a history of cardiac and respiratory arrest in 2014 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 narcotic overdose.   Dysuria He needs a prostate exam because he has been feeling some stinging sensation when he urinates. When he urinates, he denies straining. He's widowed. He denies recent sexual activity.   Exercises He denies exercising. He has an issue with his achilles and it halted his exercise.   Immunizations He was recommended for a flu shot and received one today.   Cancer screening He denies having a colonoscopy done.   Personal He is here with his son today. His primary language is Spanish so his son translated for him in the room.   Past Medical History  Diagnosis Date  . Hyperlipidemia   . OSA (obstructive sleep apnea)   . CVA (cerebral infarction)   . Hypertension   . Stress-induced cardiomyopathy -- essentially resolved     Post-op Narcotic induced Acute Hypoxic respiratory failure with respiratory and PEA cardiac arrest following accidental narcotic overdose; initial [place] 20-20%, now improved to 55%   Was supported with Impella Cardiac Cath - non-obstructive CAD    Past Surgical History  Procedure Laterality Date  . Achilles tendon repair    . Insertion of dialysis catheter Right 01/09/2013    Procedure: INSERTION OF DIALYSIS CATHETER;  Surgeon: Angelia Mould, MD;  Location: [facility] OR;  Service: Vascular;  Laterality: Right;  . Cardiac catheterization  12/29/2012    RN LHC --> Impella. for  severe shock with low EF. Mild to moderate RV pressure elevation. Normal coronary arteries. Cardiac Output 2.5, Index 1.46.  . Transthoracic echocardiogram  12/29/2012    EF 25% with periapical HK/AKA.  --> Followup echo 7/25 -- EF of 35%  . Transthoracic echocardiogram  01/11/2013    EF 55-60%. No regional WMA. Grade 1 diastolic dysfunction. Mild LA dilatation.  . Left and right heart catheterization with coronary angiogram  12/29/2012    Procedure: LEFT AND RIGHT HEART CATHETERIZATION WITH CORONARY ANGIOGRAM;  Surgeon: Sherren Mocha, MD;  Location: [facility] CATH LAB;  Service: Cardiovascular;;   Social History   Social History  . Marital Status: Married    Spouse Name: N/A  . Number of Children: N/A  . Years of Education: N/A   Social History Main Topics  . Smoking status: Former Smoker    Quit date: 06/20/1986  . Smokeless tobacco: Never Used  . Alcohol Use: Yes     Comment: History of heavy use but only social at present  . Drug Use: Yes     Comment: Prior cocaine use but none since 2000  . Sexual Activity: Not Asked   Other Topics Concern  . None   Social History Narrative   He is widowed for 13 years, and has 2 daughters: [name] & [address] [name], and one son Dontez Cioffi. the other history is daughter who is here with him today.    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   He quit smoking in 1988. He does drink alcohol socially, but he does have a history of heavy use in the past. He also has a prior history of cocaine use but none since 2000.   Family History  Problem Relation Age of Onset  . Cancer Mother     Liver cancer  . Cancer Father     Leukemia  . Cancer Maternal Grandmother     Liver cancer  . Cancer Paternal Grandmother     Colon cancer   No Known Allergies Prior to Admission medications   Medication Sig Start Date End Date Taking? Authorizing Provider  aspirin 325 MG tablet Take 325 mg by mouth daily.    Yes Historical Provider, MD  Multiple Vitamins-Minerals (ONE-A-DAY 50 PLUS) TABS Take 1 tablet by mouth daily.   Yes Historical Provider, MD     ROS:  Constitutional: negative for fever, chills, night sweats, weight changes, or fatigue  HEENT: negative for vision changes, hearing loss, congestion, rhinorrhea, ST, epistaxis, or sinus pressure Cardiovascular: negative for chest pain or palpitations Respiratory: negative for hemoptysis, wheezing, shortness of breath, or cough Abdominal: negative for abdominal pain, nausea, vomiting, diarrhea, or constipation Dermatological: negative for rash Neurologic: negative for headache, dizziness, or syncope GU: positive for dysuria All other systems reviewed and are otherwise negative with the exception to those above and in the HPI.  PHYSICAL EXAM: Filed Vitals:   06/29/15 0954  BP: 118/80  Pulse: 68  Temp: 98 F (36.7 C)  Resp: 18   Body mass index is 38.31 kg/(m^2).   General: Alert, no acute distress HEENT:  Normocephalic, atraumatic, oropharynx patent. Eye: [facility] Cardiovascular:  Regular rate and rhythm, no rubs murmurs or gallops.  No Carotid bruits, radial pulse intact. No pedal edema.  Respiratory: Clear to auscultation bilaterally.  No wheezes, rales, or rhonchi.  No cyanosis, no use of accessory musculature Abdominal: No organomegaly, abdomen is soft and non-tender, positive bowel sounds. No masses. Musculoskeletal: Gait intact. No edema, tenderness Skin: No rashes. Neurologic: Facial musculature symmetric. Psychiatric: Patient acts appropriately throughout our interaction.  Lymphatic: No cervical or submandibular lymphadenopathy Genitourinary/Anorectal: No acute findings, hypospadias, prostate normal  LABS: Results for orders placed or performed in visit on 06/29/15  POCT CBC  Result Value Ref Range   WBC 8.0 4.6 - 10.2 K/uL   Lymph, poc 2.2 0.6 - 3.4   POC LYMPH PERCENT 27.0 10 - 50 %L   MID (cbc) 0.2 0 - 0.9    POC MID % 2.6 0 - 12 %M   POC Granulocyte 5.6 2 - 6.9   Granulocyte percent 70.4 37 - 80 %G   RBC 4.60 (A) 4.69 - 6.13 M/uL   Hemoglobin 14.7 14.1 - 18.1 g/dL   HCT, POC 43.2 (A) 43.5 - 53.7 %   MCV 93.8 80 - 97 fL   MCH, POC 32.0 (A) 27 - 31.2 pg   MCHC 34.2 31.8 - 35.4 g/dL   RDW, POC 13.7 %   Platelet Count, POC 279 142 - 424 K/uL   MPV 6.5 0 - 99.8 fL  POCT urinalysis dipstick  Result Value Ref Range   Color, UA yellow yellow   Clarity, UA clear clear   Glucose, UA negative negative   Bilirubin, UA negative negative   Ketones, POC UA negative negative   Spec Grav, UA 1.020    Blood, UA trace-intact (A) negative   pH, UA 6.0  Protein Ur, POC trace (A) negative   Urobilinogen, UA 0.2    Nitrite, UA Negative Negative   Leukocytes, UA Negative Negative  POCT Microscopic Urinalysis ([facility])  Result Value Ref Range   WBC,UR,HPF,POC None None WBC/hpf   RBC,UR,HPF,POC None None RBC/hpf   Bacteria Few (A) None, Too numerous to count   Mucus Present (A) Absent   Epithelial Cells, UR Per Microscopy Few (A) None, Too numerous to count cells/hpf    EKG/XRAY:   Primary read interpreted by Dr. Everlene Farrier at [facility].   ASSESSMENT/PLAN: Routine labs done. cultures done. prostate exam was normal with the PSA ordered. STD screening was also done he has a history of obstructive sleep apnea. He is on CPAP..I personally performed the services described in this documentation, which was scribed in my presence. The recorded information has been reviewed and is accurate.   Gross sideeffects, risk and benefits, and alternatives of medications d/w patient. Patient is aware that all medications have potential sideeffects and we are unable to predict every sideeffect or drug-drug interaction that may occur.  Arlyss Queen MD 06/29/2015 10:26 AM

## 2015-06-30 LAB — URINE CULTURE: Colony Count: 9000

## 2015-07-01 LAB — GC/CHLAMYDIA PROBE AMP
CT PROBE, AMP APTIMA: NOT DETECTED
GC PROBE AMP APTIMA: NOT DETECTED

## 2015-07-01 LAB — PSA: PSA: 0.82 ng/mL (ref <=4.00)

## 2015-07-05 ENCOUNTER — Telehealth: Payer: Self-pay

## 2015-07-05 ENCOUNTER — Encounter: Payer: Self-pay | Admitting: Gastroenterology

## 2015-07-05 NOTE — Telephone Encounter (Signed)
[id number]   Patient is calling for test results.   Please call.

## 2015-07-07 NOTE — Telephone Encounter (Signed)
Pt notified of results

## 2015-07-25 ENCOUNTER — Encounter: Payer: Self-pay | Admitting: Gastroenterology

## 2015-07-31 ENCOUNTER — Ambulatory Visit (AMBULATORY_SURGERY_CENTER): Payer: Self-pay | Admitting: *Deleted

## 2015-07-31 VITALS — Ht 67.0 in | Wt 244.0 lb

## 2015-07-31 DIAGNOSIS — Z1211 Encounter for screening for malignant neoplasm of colon: Secondary | ICD-10-CM

## 2015-07-31 NOTE — Patient Instructions (Signed)
Colonoscopa (Colonoscopy) Una colonoscopa es un examen que se realiza para examinar todo el intestino grueso (colon). Este examen puede ayudar a detectar problemas, como tumores, plipos, inflamacin y reas de hemorragia. El examen dura aproximadamente 1hora.  INFORME A SU MDICO:   Cualquier alergia que tenga.  Todos los medicamentos que utiliza, incluidos vitaminas, hierbas, gotas oftlmicas, cremas y medicamentos de venta libre.  Problemas previos que usted o los miembros de su familia hayan tenido con el uso de anestsicos.  Enfermedades de la sangre.  Cirugas previas.  Enfermedades patolgicas. RIESGOS Y COMPLICACIONES  En general, se trata de un procedimiento seguro. Sin embargo, como en cualquier procedimiento, pueden surgir complicaciones. Las complicaciones posibles son:  Hemorragias.  Desgarro o ruptura de la pared del colon.  Reaccin a los medicamentos administrados durante el examen.  Infeccin (raro). ANTES DEL PROCEDIMIENTO   Consulte a su mdico si debe cambiar o suspender los medicamentos que toma habitualmente.  Posiblemente se le recete una preparacin del colon por va oral. Esto incluye beber una gran cantidad de lquido medicinal desde el da anterior a su procedimiento. El lquido har que elimine muchas heces blandas hasta que sean casi claras o de color verdoso claro. De esta manera limpiar el colon para prepararlo para el procedimiento.  No coma ni beba nada ms una vez que haya comenzado con la preparacin del colon, a menos que el mdico le indique que es seguro hacerlo.  Pdale a alguna persona que la lleve a su casa luego del procedimiento. PROCEDIMIENTO   Le administrarn un medicamento para que pueda relajarse (sedante).  Se recostar de costado con las rodillas flexionadas.  Se insertar un tubo largo y flexible con una luz y una cmara en el extremo (colonoscopio) a travs del recto y dentro del colon. La cmara enviar el video hacia  una pantalla de computadora a medida que se vaya moviendo por el colon. El colonoscopio tambin libera dixido de carbono para inflar el colon. Esto ayuda a que el mdico pueda ver mejor el rea.  Durante el examen, es posible que su mdico tome una pequea muestra de tejido (biopsia) para examinarla bajo el microscopio, si se encuentran anormalidades.  El examen finaliza cuando se ha examinado todo el colon. DESPUS DEL PROCEDIMIENTO   No conduzca vehculos durante las 24horas posteriores al examen.  Es posible que encuentre una pequea cantidad de sangre en la materia fecal.  Quizs tenga cantidades moderadas de gases y calambres o hinchazn abdominales leves. Esto se produce a causa del gas utilizado para inflar el colon durante el examen.  Pregunte cundo estarn listos los resultados del examen y cmo los obtendr. Asegrese de obtener los resultados.   Esta informacin no tiene como fin reemplazar el consejo del mdico. Asegrese de hacerle al mdico cualquier pregunta que tenga.   Document Released: 03/04/2005 Document Revised: 03/15/2013 Elsevier Interactive Patient Education 2016 [employer]vier Inc.  

## 2015-07-31 NOTE — Progress Notes (Signed)
Son at patient's side during pre-visit today. Patient is concerned about having this procedure and the "anesthesia", I did explain the deep sedation that we use. He states he had heart attack after surgery and he was told not to have anesthesia again. He then request to have office visit before the colonoscopy. Office visit made and colonoscopy cancelled. Family will be with patient as Optometrist.

## 2015-08-02 ENCOUNTER — Encounter: Payer: BLUE CROSS/BLUE SHIELD | Admitting: Gastroenterology

## 2015-08-07 ENCOUNTER — Encounter: Payer: Self-pay | Admitting: Cardiology

## 2015-08-07 ENCOUNTER — Ambulatory Visit (INDEPENDENT_AMBULATORY_CARE_PROVIDER_SITE_OTHER): Payer: BLUE CROSS/BLUE SHIELD | Admitting: Cardiology

## 2015-08-07 VITALS — BP 120/76 | HR 72 | Ht 67.0 in | Wt 245.0 lb

## 2015-08-07 DIAGNOSIS — G4733 Obstructive sleep apnea (adult) (pediatric): Secondary | ICD-10-CM | POA: Diagnosis not present

## 2015-08-07 DIAGNOSIS — I10 Essential (primary) hypertension: Secondary | ICD-10-CM | POA: Diagnosis not present

## 2015-08-07 DIAGNOSIS — R5381 Other malaise: Secondary | ICD-10-CM

## 2015-08-07 DIAGNOSIS — I5181 Takotsubo syndrome: Secondary | ICD-10-CM | POA: Diagnosis not present

## 2015-08-07 DIAGNOSIS — Z9989 Dependence on other enabling machines and devices: Secondary | ICD-10-CM

## 2015-08-07 DIAGNOSIS — E669 Obesity, unspecified: Secondary | ICD-10-CM

## 2015-08-07 DIAGNOSIS — E785 Hyperlipidemia, unspecified: Secondary | ICD-10-CM

## 2015-08-07 NOTE — Patient Instructions (Signed)
Schedule Echo    Your physician wants you to follow-up in: 1 year. You will receive a reminder letter in the mail two months in advance. If you don't receive a letter, please call our office to schedule the follow-up appointment.  

## 2015-08-07 NOTE — Progress Notes (Signed)
PCP: No primary care provider on file.  Clinic Note: Chief Complaint  Patient presents with  . 6 MONTHS    Patient has no complaints.  . Cardiomyopathy    Stress-induced cardiac myopathy in setting of severe sepsis. Now resolved.    HPI: Nicholas Potts is a 55 y.o. male with a PMH below who presents today for 6 month f/u. 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 He was supported with Impella and was noted to have gradual improvement of his EF back to essentially normal baseline by repeat Echo. When I last saw him in Feb 2016, he was more troubled by back pain than DOE.  Relatively stable.  His daugther was concerned about his EtoH consumption (drinks 40 oz Malt Liquor). Notable dysthymia.  Nicholas Potts was last seen in Aug 2016 by [facility]. No complaints. Stopped Coreg 2/2 hypotension.  Recent Hospitalizations: None  Studies Reviewed: None  His daughters here and helps translate since he speaks [place].  Interval History: He comes in today really without any major complaints. He still has a little bit of exertion, this is usually related to going up a flight of steps or up hill. He says he gets a little bit more short of breath and his heart rate gets up. But he freely admits that this is probably related to deconditioning. Overall his mood and affect is improved dramatically since he is now back to work. He has occasional episodes with a little bit like orthopnea, but no real PND. He is using CPAP and doing well. As a result, he feels less fatigued and is more rested. He notes occasional very short-lived spells of maybe 10-15 seconds of palpitations relatively intermittently. Probably less than once a month.  No chest pain or shortness of breath with rest or baseline exertion.    No significant.  Palpitations noted above. No lightheadedness, dizziness, weakness or syncope/near syncope. No TIA/amaurosis fugax symptoms. No claudication.  ROS: A comprehensive was performed. Review of Systems  Constitutional: Negative for weight loss and malaise/fatigue.  HENT: Negative for nosebleeds.   Respiratory: Negative for cough.   Cardiovascular:       Per history of present illness  Gastrointestinal: Negative for blood in stool and melena.  Genitourinary: Negative for hematuria.  Musculoskeletal: Positive for back pain and joint pain. Negative for myalgias and falls.  Skin: Negative.   Neurological: Negative for weakness and headaches.  Endo/Heme/Allergies: Does not bruise/bleed easily.  Psychiatric/Behavioral: Negative for depression (overall notably improved symptoms since back working) and memory loss. The patient is not nervous/anxious and does not have insomnia.   All other systems reviewed and are negative.    Past Medical History  Diagnosis Date  . Hyperlipidemia   . OSA (obstructive sleep apnea)   . CVA (cerebral infarction)   . Hypertension   . Stress-induced cardiomyopathy -- essentially resolved     Post-op Narcotic induced Acute Hypoxic respiratory failure with respiratory and PEA cardiac arrest following accidental narcotic overdose; initial [place] 20-20%, now improved to 55%   Was supported with Impella Cardiac Cath - non-obstructive CAD   . Sleep apnea     Past Surgical History  Procedure Laterality Date  . Achilles tendon repair    . Insertion of dialysis catheter Right 01/09/2013    Procedure: INSERTION OF DIALYSIS CATHETER;  Surgeon: Angelia Mould, MD;  Location: [facility] OR;  Service: Vascular;  Laterality: Right;  . Cardiac catheterization  12/29/2012    RN LHC --> Impella. for severe shock with low EF. Mild to moderate RV pressure elevation. Normal coronary arteries. Cardiac Output 2.5, Index 1.46.  . Transthoracic echocardiogram  12/29/2012     EF 25% with periapical HK/AKA.  --> Followup echo 7/25 -- EF of 35%  . Transthoracic echocardiogram  01/11/2013    EF 55-60%. No regional WMA. Grade 1 diastolic dysfunction. Mild LA dilatation.  . Left and right heart catheterization with coronary angiogram  12/29/2012    Procedure: LEFT AND RIGHT HEART CATHETERIZATION WITH CORONARY ANGIOGRAM;  Surgeon: Sherren Mocha, MD;  Location: [facility] CATH LAB;  Service: Cardiovascular;;   Prior to Admission medications   Medication Sig Start Date End Date Taking? Authorizing Provider  aspirin 325 MG tablet Take 325 mg by mouth daily.   Yes Historical Provider, MD  Multiple Vitamins-Minerals (ONE-A-DAY 50 PLUS) TABS Take 1 tablet by mouth daily.   Yes Historical Provider, MD    No Known Allergies   Social History   Social History  . Marital Status: Married    Spouse Name: N/A  . Number of Children: N/A  . Years of Education: N/A   Social History Main Topics  . Smoking status: Former Smoker    Quit date: 06/20/1986  . Smokeless tobacco: Never Used  . Alcohol Use: No     Comment: History of heavy use but only social at present  . Drug Use: Yes     Comment: Prior cocaine use but none since 2000  . Sexual Activity: Not Asked   Other Topics Concern  . None   Social History Narrative   He is widowed for 13 years, and has 2 daughters: [name] & [address] [name], and one son Zandon Pinell. the other history is daughter who is here with him today.    He works for [employer] as a [employer].   He quit smoking in 1988. He does drink alcohol socially, but he does have a history of heavy use in the past. He also has a prior history of cocaine use but none since 2000.   Family History  Problem Relation Age of Onset  . Cancer Mother     Liver cancer  . Cancer Father     Leukemia  . Cancer Maternal Grandmother     Liver cancer  . Cancer Paternal Grandmother     Colon cancer  . Colon cancer Neg Hx       Wt Readings from Last 3 Encounters:  08/07/15 245 lb (111.131 kg)  07/31/15 244 lb (110.678 kg)  06/29/15 246 lb 6.4 oz (111.766 kg)    PHYSICAL EXAM BP 120/76 mmHg  Pulse 72  Ht 5\' 7"  (1.702 m)  Wt 245 lb (111.131 kg)  BMI 38.36 kg/m2 General appearance: alert, cooperative, appears stated age, no distress and moderately obese Neck: no adenopathy, no carotid bruit, no JVD and supple, symmetrical, trachea midline Lungs: clear to auscultation bilaterally, normal percussion bilaterally and Good air movement. Nonlabored Heart: regular rate and rhythm, S1, S2 normal, no murmur, click, rub or gallop and normal apical impulse Abdomen: soft, non-tender; bowel sounds normal; no masses, no organomegaly and Obese Extremities: extremities normal, atraumatic, no cyanosis or edema Pulses: 2+ and symmetric Neurologic: Alert and oriented X 3, normal strength and tone. Normal symmetric reflexes. Normal coordination and gait  Adult ECG Report Not checked  Other studies Reviewed: Additional studies/ records that were reviewed today include:  Recent Labs:    Lab Results  Component Value Date   CHOL 207* 06/29/2015   HDL 30* 06/29/2015   LDLCALC 128 06/29/2015   TRIG 243* 06/29/2015   CHOLHDL 6.9* 06/29/2015    ASSESSMENT / PLAN: Problem List Items Addressed This Visit    Stress-induced cardiomyopathy -- essentially resolved - Primary (Chronic)    Echocardiographically, his EF seem to have improved on last evaluation. He is noticing a little bit of orthopnea, and some exertional dyspnea along with palpitations. I'm concerned that there may be some backward progression of these been off his carvedilol. We will recheck an echocardiogram just to ensure any stable. If stable then I would probably not recheck another one unless he had worsening symptoms. He is no longer on carvedilol because of borderline hypotension and his essential resolution of the cardiac myopathy by echocardiogram.       Relevant Orders   ECHOCARDIOGRAM COMPLETE   Physical deconditioning (Chronic)    Probably the most concerning issue for him as far as exertional dyspnea goes. For the reason for check an echocardiogram to confirm that he indeed has recovered from his cardiomyopathy.      OSA on CPAP (Chronic)    Continues to use CPAP      Obesity (BMI 30-39.9) (Chronic)    The patient understands the need to lose weight with diet and exercise. We have discussed specific strategies for this.      Hyperlipidemia (Chronic)    No longer on statin. Given his risk factors I would try to shoot for an LDL closer 100. His HDL is 30, with obesity and elevated triglycerides, he is essentially in the metabolic syndrome category. Would strongly consider reinstituting some to the therapy for lipid control. He still needs to lose weight      Essential hypertension (Chronic)    Really doesn't have significant hypertension. No longer on any medications with well-controlled blood pressure.         Current medicines are reviewed at length with the patient today. (+/- concerns) none The following changes have been made: None Studies Ordered:   Orders Placed This Encounter  Procedures  . ECHOCARDIOGRAM COMPLETE    Follow-up in one year. If stable then can probably moved when necessary.   Leonie Man, M.D., M.S. Interventional Cardiologist   Pager # [phone] Phone # [phone] [address] [place], [place] [address]

## 2015-08-09 NOTE — Assessment & Plan Note (Signed)
The patient understands the need to lose weight with diet and exercise. We have discussed specific strategies for this.  

## 2015-08-09 NOTE — Assessment & Plan Note (Signed)
Continues to use CPAP. °

## 2015-08-09 NOTE — Assessment & Plan Note (Signed)
Really doesn't have significant hypertension. No longer on any medications with well-controlled blood pressure.

## 2015-08-09 NOTE — Assessment & Plan Note (Signed)
Echocardiographically, his EF seem to have improved on last evaluation. He is noticing a little bit of orthopnea, and some exertional dyspnea along with palpitations. I'm concerned that there may be some backward progression of these been off his carvedilol. We will recheck an echocardiogram just to ensure any stable. If stable then I would probably not recheck another one unless he had worsening symptoms. He is no longer on carvedilol because of borderline hypotension and his essential resolution of the cardiac myopathy by echocardiogram.

## 2015-08-09 NOTE — Assessment & Plan Note (Signed)
Probably the most concerning issue for him as far as exertional dyspnea goes. For the reason for check an echocardiogram to confirm that he indeed has recovered from his cardiomyopathy.

## 2015-08-09 NOTE — Assessment & Plan Note (Signed)
No longer on statin. Given his risk factors I would try to shoot for an LDL closer 100. His HDL is 30, with obesity and elevated triglycerides, he is essentially in the metabolic syndrome category. Would strongly consider reinstituting some to the therapy for lipid control. He still needs to lose weight

## 2015-08-22 ENCOUNTER — Ambulatory Visit (HOSPITAL_COMMUNITY): Payer: BLUE CROSS/BLUE SHIELD | Attending: Cardiology

## 2015-08-22 ENCOUNTER — Other Ambulatory Visit: Payer: Self-pay

## 2015-08-22 DIAGNOSIS — G4733 Obstructive sleep apnea (adult) (pediatric): Secondary | ICD-10-CM | POA: Diagnosis not present

## 2015-08-22 DIAGNOSIS — I351 Nonrheumatic aortic (valve) insufficiency: Secondary | ICD-10-CM | POA: Insufficient documentation

## 2015-08-22 DIAGNOSIS — I119 Hypertensive heart disease without heart failure: Secondary | ICD-10-CM | POA: Diagnosis not present

## 2015-08-22 DIAGNOSIS — E669 Obesity, unspecified: Secondary | ICD-10-CM | POA: Insufficient documentation

## 2015-08-22 DIAGNOSIS — Z6838 Body mass index (BMI) 38.0-38.9, adult: Secondary | ICD-10-CM | POA: Diagnosis not present

## 2015-08-22 DIAGNOSIS — I5181 Takotsubo syndrome: Secondary | ICD-10-CM

## 2015-08-22 DIAGNOSIS — E785 Hyperlipidemia, unspecified: Secondary | ICD-10-CM | POA: Diagnosis not present

## 2015-08-23 ENCOUNTER — Encounter: Payer: Self-pay | Admitting: Gastroenterology

## 2015-08-27 ENCOUNTER — Telehealth: Payer: Self-pay | Admitting: *Deleted

## 2015-08-27 NOTE — Telephone Encounter (Signed)
-----   Message from Skeet Latch, MD sent at 08/24/2015 11:54 AM EDT ----- Echo shows his heart does not relax completely.  It will be important keep his blood pressure well-controlled.

## 2015-08-27 NOTE — Telephone Encounter (Signed)
Spoke to patient. Result given . Verbalized understanding  

## 2015-10-02 ENCOUNTER — Ambulatory Visit (INDEPENDENT_AMBULATORY_CARE_PROVIDER_SITE_OTHER): Payer: BLUE CROSS/BLUE SHIELD | Admitting: Gastroenterology

## 2015-10-02 ENCOUNTER — Encounter: Payer: Self-pay | Admitting: Gastroenterology

## 2015-10-02 VITALS — BP 100/70 | HR 80 | Ht 67.0 in | Wt 244.0 lb

## 2015-10-02 DIAGNOSIS — Z1211 Encounter for screening for malignant neoplasm of colon: Secondary | ICD-10-CM

## 2015-10-02 NOTE — Progress Notes (Signed)
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 He was supported with Impella temporarily and and had gradual improvement of his cardiac function and his most recent EF is back to normal baseline based on recent Echo in March 2017. He is here to discuss colonoscopy. He has family history of leukemia and liver cancer but no history of colon or are any other GI luminal cancer. Denies any nausea, vomiting, abdominal pain, melena or bright red blood per rectum    Outpatient Encounter Prescriptions as of 10/02/2015  Medication Sig  . aspirin 325 MG tablet Take 325 mg by mouth daily.  . Multiple Vitamins-Minerals (ONE-A-DAY 50 PLUS) TABS Take 1 tablet by mouth daily.   No facility-administered encounter medications on file as of 10/02/2015.    Allergies as of 10/02/2015 - Review Complete 10/02/2015  Allergen Reaction Noted  . Oxycodone  10/02/2015    Past Medical History  Diagnosis Date  . Hyperlipidemia   . OSA (obstructive sleep apnea)   . CVA (cerebral infarction)   . Hypertension   . Stress-induced cardiomyopathy -- essentially resolved     Post-op Narcotic induced Acute Hypoxic respiratory failure with respiratory and PEA cardiac arrest following accidental narcotic overdose; initial [place] 20-20%, now improved to 55%   Was supported with Impella Cardiac Cath - non-obstructive CAD   . Sleep apnea     Past Surgical History  Procedure Laterality Date  . Achilles tendon repair    . Insertion of dialysis catheter Right 01/09/2013    Procedure: INSERTION OF DIALYSIS CATHETER;  Surgeon: Angelia Mould, MD;  Location: [facility] OR;  Service: Vascular;  Laterality: Right;  . Cardiac catheterization  12/29/2012    RN LHC --> Impella. for severe shock with low EF. Mild to moderate RV pressure elevation. Normal coronary arteries. Cardiac Output 2.5, Index 1.46.  . Transthoracic echocardiogram  12/29/2012    EF 25% with periapical HK/AKA.  --> Followup echo 7/25 -- EF of 35%  . Transthoracic echocardiogram  01/11/2013    EF 55-60%. No regional WMA. Grade 1 diastolic dysfunction. Mild LA dilatation.  . Left and right heart catheterization with coronary angiogram  12/29/2012    Procedure: LEFT AND RIGHT HEART CATHETERIZATION WITH CORONARY ANGIOGRAM;  Surgeon: Sherren Mocha, MD;  Location: [facility] CATH LAB;  Service: Cardiovascular;;    Family History  Problem Relation Age of Onset  . Cancer Mother     Liver cancer  . Cancer Father     Leukemia  . Cancer Maternal Grandmother     Liver cancer  . Cancer Paternal Grandmother     Colon cancer  . Colon cancer Neg Hx     Social History   Social History  . Marital Status: Married    Spouse Name: N/A  . Number of Children: N/A  . Years of Education: N/A   Occupational History  . Not on file.   Social History Main Topics  . Smoking status: Former Smoker    Quit date: 06/20/1986  . Smokeless tobacco: Never Used  . Alcohol Use: No     Comment: History of heavy  use but only social at present  . Drug Use: No     Comment: Prior cocaine use but none since 2000  . Sexual Activity: Not on file   Other Topics Concern  . Not on file   Social History Narrative   He is widowed for 13 years, and has 2 daughters: [name] & [address] [name], and one son [name]. the other history is daughter who is here with him today.    He works for [employer] as a [employer].   He quit smoking in 1988. He does drink alcohol socially, but he does have a history of heavy use in the past. He also has a prior  history of cocaine use but none since 2000.      Review of systems: Review of Systems  Constitutional: Negative for fever and chills.  HENT: Negative.   Eyes: Negative for blurred vision.  Respiratory: Negative for cough, shortness of breath and wheezing.   Cardiovascular: Negative for chest pain and palpitations.  Gastrointestinal: as per HPI Genitourinary: Negative for dysuria, urgency, frequency and hematuria.  Musculoskeletal: Negative for myalgias, back pain and joint pain.  Skin: Negative for itching and rash.  Neurological: Negative for dizziness, tremors, focal weakness, seizures and loss of consciousness.  Endo/Heme/Allergies: Negative for environmental allergies.  Psychiatric/Behavioral: Negative for depression, suicidal ideas and hallucinations.  All other systems reviewed and are negative.   Physical Exam: Filed Vitals:   10/02/15 1529  BP: 100/70  Pulse: 80   Gen:      No acute distress HEENT:  EOMI, sclera anicteric Neck:     No masses; no thyromegaly Lungs:    Clear to auscultation bilaterally; normal respiratory effort CV:         Regular rate and rhythm; no murmurs Abd:      + bowel sounds; soft, non-tender; no palpable masses, no distension Ext:    No edema; adequate peripheral perfusion Skin:      Warm and dry; no rash Neuro: alert and oriented x 3 Psych: normal mood and affect  Data Reviewed:  Reviewed chart and data in epic   Assessment and Plan/Recommendations:  55 year old male with history of obstructive sleep apnea and PEA arrest in July 2014 here to discuss screening colonoscopy We'll schedule the patient and the hospital given his history of obstructive sleep apnea and PEA in the setting of narcotic overdose The risks and benefits as well as alternatives of endoscopic procedure(s) have been discussed and reviewed. All questions answered. The patient agrees to proceed. Return as needed  [name] , MD [phone] Mon-Fri  8a-5p [phone] after 5p, weekends, holidays

## 2015-10-07 ENCOUNTER — Telehealth: Payer: Self-pay | Admitting: Gastroenterology

## 2015-10-07 NOTE — Telephone Encounter (Signed)
Spoke with Leatrice Jewels. Patient has not followed his diet as per the instructions given. He wants to reschedule his colonoscopy if possible. Appointment moved to 10/30/15 arrive at 9:30 am to [place] instructions mailed.

## 2015-10-16 ENCOUNTER — Telehealth: Payer: Self-pay | Admitting: Cardiology

## 2015-10-16 NOTE — Telephone Encounter (Signed)
New message    Pt son verbalized that he is calling to speak to rn about pt results that was abnormal

## 2015-10-16 NOTE — Telephone Encounter (Signed)
Returned call to patient's son (DPR) Patient has a colonoscopy at the end of this month Concerned if he is OK for this procedure Son states that patient told him he had an EKG a few weeks ago and something with his sodium was off? There isn't an EKG in EPIC since 01/2015 Reported to son Marquise the echo report from March  He will follow up with his dad on exactly what the test was that was cause for concern and call back No further action necessary at this time

## 2015-10-29 ENCOUNTER — Encounter (HOSPITAL_COMMUNITY): Payer: Self-pay | Admitting: *Deleted

## 2015-10-29 ENCOUNTER — Telehealth: Payer: Self-pay | Admitting: Cardiology

## 2015-10-29 NOTE — Telephone Encounter (Signed)
Request for surgical clearance:  1. What type of surgery is being performed? Colonoscopy  2. When is this surgery scheduled? 5/24   3. Are there any medications that need to be held prior to surgery and how long? That was not mentioned    4. Name of physician performing surgery? Dr. Marnette Burgess  5. What is your office phone and fax number? [phone]   PHYSICIAN NEEDS TO KNOW IF THERE WERE ANY ABNORMALITIES FROM THE ECHO DONE ON 08/22/14 TO KEEP HIM FROM PROCEEDING WITH THIS PROCEDURE AND POSSIBLY NEEDING GENERAL ANESTHESIA .PLEASE F/U WITH THE OFFICE

## 2015-10-30 ENCOUNTER — Ambulatory Visit (HOSPITAL_COMMUNITY)
Admission: RE | Admit: 2015-10-30 | Discharge: 2015-10-30 | Disposition: A | Discharge status: Home / Self Care | Payer: BLUE CROSS/BLUE SHIELD | Source: Ambulatory Visit | Attending: Gastroenterology | Admitting: Gastroenterology

## 2015-10-30 ENCOUNTER — Encounter (HOSPITAL_COMMUNITY): Payer: Self-pay | Admitting: *Deleted

## 2015-10-30 ENCOUNTER — Encounter (HOSPITAL_COMMUNITY)
Admission: RE | Disposition: A | Discharge status: Home / Self Care | Payer: Self-pay | Source: Ambulatory Visit | Attending: Gastroenterology

## 2015-10-30 ENCOUNTER — Ambulatory Visit (HOSPITAL_COMMUNITY): Payer: BLUE CROSS/BLUE SHIELD | Admitting: Certified Registered Nurse Anesthetist

## 2015-10-30 DIAGNOSIS — K648 Other hemorrhoids: Secondary | ICD-10-CM | POA: Insufficient documentation

## 2015-10-30 DIAGNOSIS — Z800 Family history of malignant neoplasm of digestive organs: Secondary | ICD-10-CM | POA: Insufficient documentation

## 2015-10-30 DIAGNOSIS — D122 Benign neoplasm of ascending colon: Secondary | ICD-10-CM | POA: Insufficient documentation

## 2015-10-30 DIAGNOSIS — Z87891 Personal history of nicotine dependence: Secondary | ICD-10-CM | POA: Diagnosis not present

## 2015-10-30 DIAGNOSIS — Z7982 Long term (current) use of aspirin: Secondary | ICD-10-CM | POA: Diagnosis not present

## 2015-10-30 DIAGNOSIS — G4733 Obstructive sleep apnea (adult) (pediatric): Secondary | ICD-10-CM | POA: Insufficient documentation

## 2015-10-30 DIAGNOSIS — Z1211 Encounter for screening for malignant neoplasm of colon: Secondary | ICD-10-CM | POA: Diagnosis present

## 2015-10-30 DIAGNOSIS — E785 Hyperlipidemia, unspecified: Secondary | ICD-10-CM | POA: Insufficient documentation

## 2015-10-30 DIAGNOSIS — D124 Benign neoplasm of descending colon: Secondary | ICD-10-CM | POA: Insufficient documentation

## 2015-10-30 DIAGNOSIS — D175 Benign lipomatous neoplasm of intra-abdominal organs: Secondary | ICD-10-CM | POA: Insufficient documentation

## 2015-10-30 DIAGNOSIS — Z8673 Personal history of transient ischemic attack (TIA), and cerebral infarction without residual deficits: Secondary | ICD-10-CM | POA: Diagnosis not present

## 2015-10-30 DIAGNOSIS — D123 Benign neoplasm of transverse colon: Secondary | ICD-10-CM | POA: Insufficient documentation

## 2015-10-30 HISTORY — DX: Chronic kidney disease, unspecified: N18.9

## 2015-10-30 HISTORY — DX: Acute respiratory failure, unspecified whether with hypoxia or hypercapnia: J96.00

## 2015-10-30 HISTORY — DX: Thrombocytopenia, unspecified: D69.6

## 2015-10-30 HISTORY — PX: COLONOSCOPY WITH PROPOFOL: SHX5780

## 2015-10-30 HISTORY — DX: Disease of blood and blood-forming organs, unspecified: D75.9

## 2015-10-30 HISTORY — DX: Constipation, unspecified: K59.00

## 2015-10-30 HISTORY — DX: Poisoning by unspecified narcotics, accidental (unintentional), initial encounter: T40.601A

## 2015-10-30 HISTORY — DX: Adverse effect of unspecified anesthetic, initial encounter: T41.45XA

## 2015-10-30 SURGERY — COLONOSCOPY WITH PROPOFOL
Anesthesia: Monitor Anesthesia Care

## 2015-10-30 MED ORDER — LIDOCAINE HCL (CARDIAC) 20 MG/ML IV SOLN
INTRAVENOUS | Status: DC | PRN
  Administered 2015-10-30 (×2): 50 mg via INTRAVENOUS

## 2015-10-30 MED ORDER — SODIUM CHLORIDE 0.9 % IV SOLN: INTRAVENOUS | Status: DC

## 2015-10-30 MED ORDER — PROPOFOL 500 MG/50ML IV EMUL
INTRAVENOUS | Status: DC | PRN
  Administered 2015-10-30: 12:00:00 via INTRAVENOUS
  Administered 2015-10-30: 80 ug/kg/min via INTRAVENOUS

## 2015-10-30 MED ORDER — LACTATED RINGERS IV SOLN
INTRAVENOUS | Status: DC
  Administered 2015-10-30: 1000 mL via INTRAVENOUS

## 2015-10-30 MED ORDER — PROPOFOL 10 MG/ML IV BOLUS
INTRAVENOUS | Status: DC | PRN
  Administered 2015-10-30: 10 mg via INTRAVENOUS
  Administered 2015-10-30: 15 mg via INTRAVENOUS
  Administered 2015-10-30: 10 mg via INTRAVENOUS

## 2015-10-30 NOTE — Discharge Instructions (Signed)
Colonoscopy, Care After °Refer to this sheet in the next few weeks. These instructions provide you with information on caring for yourself after your procedure. Your health care provider may also give you more specific instructions. Your treatment has been planned according to current medical practices, but problems sometimes occur. Call your health care provider if you have any problems or questions after your procedure. °WHAT TO EXPECT AFTER THE PROCEDURE  °After your procedure, it is typical to have the following: °· A small amount of blood in your stool. °· Moderate amounts of gas and mild abdominal cramping or bloating. °HOME CARE INSTRUCTIONS °· Do not drive, operate machinery, or sign important documents for 24 hours. °· You may shower and resume your regular physical activities, but move at a slower pace for the first 24 hours. °· Take frequent rest periods for the first 24 hours. °· Walk around or put a warm pack on your abdomen to help reduce abdominal cramping and bloating. °· Drink enough fluids to keep your urine clear or pale yellow. °· You may resume your normal diet as instructed by your health care provider. Avoid heavy or fried foods that are hard to digest. °· Avoid drinking alcohol for 24 hours or as instructed by your health care provider. °· Only take over-the-counter or prescription medicines as directed by your health care provider. °· If a tissue sample (biopsy) was taken during your procedure: °¨ Do not take aspirin or blood thinners for 7 days, or as instructed by your health care provider. °¨ Do not drink alcohol for 7 days, or as instructed by your health care provider. °¨ Eat soft foods for the first 24 hours. °SEEK MEDICAL CARE IF: °You have persistent spotting of blood in your stool 2-3 days after the procedure. °SEEK IMMEDIATE MEDICAL CARE IF: °· You have more than a small spotting of blood in your stool. °· You pass large blood clots in your stool. °· Your abdomen is swollen  (distended). °· You have nausea or vomiting. °· You have a fever. °· You have increasing abdominal pain that is not relieved with medicine. °  °This information is not intended to replace advice given to you by your health care provider. Make sure you discuss any questions you have with your health care provider. °  °Document Released: 01/07/2004 Document Revised: 03/15/2013 Document Reviewed: 01/30/2013 °Elsevier Interactive Patient Education ©2016 [employer] ° °

## 2015-10-30 NOTE — Anesthesia Preprocedure Evaluation (Addendum)
Anesthesia Evaluation  Patient identified by MRN, date of birth, ID band Patient awake    Reviewed: Allergy & Precautions, NPO status , Patient's Chart, lab work & pertinent test results  Airway Mallampati: II  TM Distance: >3 FB Neck ROM: Full    Dental   Pulmonary sleep apnea , former smoker,    breath sounds clear to auscultation       Cardiovascular hypertension,  Rhythm:Regular Rate:Normal     Neuro/Psych    GI/Hepatic negative GI ROS, Neg liver ROS,   Endo/Other    Renal/GU Renal disease     Musculoskeletal   Abdominal   Peds  Hematology   Anesthesia Other Findings   Reproductive/Obstetrics                           Anesthesia Physical Anesthesia Plan  ASA: III  Anesthesia Plan: MAC   Post-op Pain Management:    Induction: Intravenous  Airway Management Planned: Simple Face Mask  Additional Equipment:   Intra-op Plan:   Post-operative Plan:   Informed Consent: I have reviewed the patients History and Physical, chart, labs and discussed the procedure including the risks, benefits and alternatives for the proposed anesthesia with the patient or authorized representative who has indicated his/her understanding and acceptance.   Dental advisory given  Plan Discussed with: CRNA and Anesthesiologist  Anesthesia Plan Comments:        Anesthesia Quick Evaluation

## 2015-10-30 NOTE — Op Note (Signed)
[facility] Patient Name: Nicholas Potts Procedure Date : 10/30/2015 MRN: [id number] Attending MD: Mauri Pole , MD Date of Birth: [birth year] CSN: [id number] Age: 55 Admit Type: Outpatient Procedure:                Colonoscopy Indications:              Screening for colorectal malignant neoplasm Providers:                Mauri Pole, MD, Kingsley Plan, RN, Alfonso Patten, Technician, Lavona Mound, CRNA Referring MD:              Medicines:                Monitored Anesthesia Care Complications:            No immediate complications. Estimated Blood Loss:     Estimated blood loss was minimal. Procedure:                Pre-Anesthesia Assessment:                           - Prior to the procedure, a History and Physical                            was performed, and patient medications and                            allergies were reviewed. The patient's tolerance of                            previous anesthesia was also reviewed. The risks                            and benefits of the procedure and the sedation                            options and risks were discussed with the patient.                            All questions were answered, and informed consent                            was obtained. Prior Anticoagulants: The patient                            last took aspirin on the day of the procedure. ASA                            Grade Assessment: III - A patient with severe                            systemic disease. After reviewing the risks and  benefits, the patient was deemed in satisfactory                            condition to undergo the procedure.                           After obtaining informed consent, the colonoscope                            was passed under direct vision. Throughout the                            procedure, the patient's blood pressure, pulse, and                             oxygen saturations were monitored continuously. The                            EC-[id number]) scope was introduced through                            the anus and advanced to the the cecum, identified                            by appendiceal orifice and ileocecal valve. The                            colonoscopy was performed without difficulty. The                            patient tolerated the procedure well. The quality                            of the bowel preparation was good. The ileocecal                            valve, appendiceal orifice, and rectum were                            photographed. Scope In: 11:57:18 AM Scope Out: 12:20:26 PM Scope Withdrawal Time: 0 hours 19 minutes 17 seconds  Total Procedure Duration: 0 hours 23 minutes 8 seconds  Findings:      The perianal and digital rectal examinations were normal.      A 5 mm polyp was found in the transverse colon. The polyp was sessile.       The polyp was removed with a cold biopsy forceps. Resection and       retrieval were complete.      Two sessile polyps were found in the descending colon and ascending       colon. The polyps were 8 to 11 mm in size. These polyps were removed       with a cold snare. Resection and retrieval were complete. Noted oozing       of blood after polypectomy in descending colon, controlled with  placement of hemostatic clip x1. Small lipoma in the ascending colon      Non-bleeding internal hemorrhoids were found during retroflexion. The       hemorrhoids were small. Impression:               - One 5 mm polyp in the transverse colon, removed                            with a cold biopsy forceps. Resected and retrieved.                           - Two 8 to 11 mm polyps in the descending colon and                            in the ascending colon, removed with a cold snare.                            Resected and retrieved.                           -  Non-bleeding internal hemorrhoids. Moderate Sedation:      N/A Recommendation:           - Patient has a contact number available for                            emergencies. The signs and symptoms of potential                            delayed complications were discussed with the                            patient. Return to normal activities tomorrow.                            Written discharge instructions were provided to the                            patient.                           - Resume previous diet.                           - Continue present medications.                           - Await pathology results.                           - Repeat colonoscopy in 3 years for surveillance.                           - Return to GI clinic PRN. Procedure Code(s):        --- Professional ---  45385, Colonoscopy, flexible; with removal of                            tumor(s), polyp(s), or other lesion(s) by snare                            technique                           45380, 57, Colonoscopy, flexible; with biopsy,                            single or multiple Diagnosis Code(s):        --- Professional ---                           Z12.11, Encounter for screening for malignant                            neoplasm of colon                           D12.3, Benign neoplasm of transverse colon (hepatic                            flexure or splenic flexure)                           D12.4, Benign neoplasm of descending colon                           D12.2, Benign neoplasm of ascending colon                           K64.8, Other hemorrhoids CPT copyright 2016 American Medical Association. All rights reserved. The codes documented in this report are preliminary and upon coder review may  be revised to meet current compliance requirements. Mauri Pole, MD 10/30/2015 12:44:22 PM This report has been signed electronically. Number of Addenda: 0

## 2015-10-30 NOTE — Anesthesia Procedure Notes (Signed)
Procedure Name: MAC Date/Time: 10/30/2015 11:49 AM Performed by: Salli Quarry Ophia Shamoon Pre-anesthesia Checklist: Patient identified, Emergency Drugs available, Suction available and Patient being monitored Patient Re-evaluated:Patient Re-evaluated prior to inductionOxygen Delivery Method: Simple face mask

## 2015-10-30 NOTE — Anesthesia Postprocedure Evaluation (Signed)
Anesthesia Post Note  Patient: Nicholas Potts  Procedure(s) Performed: Procedure(s) (LRB): COLONOSCOPY WITH PROPOFOL (N/A)  Patient location during evaluation: Endoscopy Level of consciousness: awake Pain management: pain level controlled Vital Signs Assessment: post-procedure vital signs reviewed and stable Cardiovascular status: stable Anesthetic complications: no    Last Vitals:  Filed Vitals:   10/30/15 1250 10/30/15 1300  BP: 106/72 108/65  Pulse: 67 55  Temp:    Resp: 13 11    Last Pain:  Filed Vitals:   10/30/15 1304  PainSc: 3                  EDWARDS,Jeannia Tatro

## 2015-10-30 NOTE — Transfer of Care (Signed)
Immediate Anesthesia Transfer of Care Note  Patient: Nicholas Potts  Procedure(s) Performed: Procedure(s): COLONOSCOPY WITH PROPOFOL (N/A)  Patient Location: Endoscopy Unit  Anesthesia Type:MAC  Level of Consciousness: awake, oriented and patient cooperative  Airway & Oxygen Therapy: Patient Spontanous Breathing and Patient connected to face mask oxygen  Post-op Assessment: Report given to RN and Post -op Vital signs reviewed and stable  Post vital signs: Reviewed  Last Vitals:  Filed Vitals:   10/30/15 1231 10/30/15 1240  BP: 96/56 102/54  Pulse: 76 72  Temp: 36.5 C   Resp: 13 15    Last Pain: There were no vitals filed for this visit.       Complications: No apparent anesthesia complications

## 2015-10-30 NOTE — H&P (Signed)
[facility] History and Physical   Primary Care Physician:  Glenetta Hew, MD   Reason for Procedure:   screening for colorectal cancer. Plan:   Colonoscopy with possible intervention     HPI: Nicholas Potts is a 55 y.o. male here for screening colonoscopy. The risks and benefits as well as alternatives of endoscopic procedure(s) have been discussed and reviewed. All questions answered. The patient agrees to proceed.    Past Medical History  Diagnosis Date  . Hyperlipidemia   . OSA (obstructive sleep apnea)   . CVA (cerebral infarction)   . Hypertension   . Stress-induced cardiomyopathy -- essentially resolved     Post-op Narcotic induced Acute Hypoxic respiratory failure with respiratory and PEA cardiac arrest following accidental narcotic overdose; initial [place] 20-20%, now improved to 55%   Was supported with Impella Cardiac Cath - non-obstructive CAD   . Sleep apnea   . Chronic kidney disease 2014    ARF   . Blood dyscrasia   . Thrombocytopenia ([place]) 2014  . ARF (acute respiratory failure) ([place]) 2014  . Accidental opiate poisoning 2014    post op   . Constipation   . Complication of anesthesia 2014    - post op - in combination of pain pills-  Cardiac and Respiratory Arrest- prior to sleep apnea diagonosis    Past Surgical History  Procedure Laterality Date  . Achilles tendon repair    . Insertion of dialysis catheter Right 01/09/2013    Procedure: INSERTION OF DIALYSIS CATHETER;  Surgeon: Angelia Mould, MD;  Location: [facility] OR;  Service: Vascular;  Laterality: Right;  . Cardiac catheterization  12/29/2012    RN LHC --> Impella. for severe shock with low EF. Mild to moderate RV pressure elevation. Normal coronary arteries. Cardiac Output 2.5, Index 1.46.  . Transthoracic echocardiogram  12/29/2012    EF 25% with periapical HK/AKA.  --> Followup echo 7/25 -- EF of 35%  . Transthoracic echocardiogram  01/11/2013    EF 55-60%. No regional WMA. Grade 1  diastolic dysfunction. Mild LA dilatation.  . Left and right heart catheterization with coronary angiogram  12/29/2012    Procedure: LEFT AND RIGHT HEART CATHETERIZATION WITH CORONARY ANGIOGRAM;  Surgeon: Sherren Mocha, MD;  Location[facility] CATH LAB;  Service: Cardiovascular;;    Prior to Admission medications   Medication Sig Start Date End Date Taking? Authorizing Provider  aspirin 325 MG tablet Take 325 mg by mouth daily.   Yes Historical Provider, MD  Multiple Vitamins-Minerals (ONE-A-DAY 50 PLUS) TABS Take 1 tablet by mouth daily.   Yes Historical Provider, MD  omega-3 acid ethyl esters (LOVAZA) 1 g capsule Take 1 g by mouth 2 (two) times daily.   Yes Historical Provider, MD    Current Facility-Administered Medications  Medication Dose Route Frequency Provider Last Rate Last Dose  . 0.9 %  sodium chloride infusion   Intravenous Continuous Kavitha Nandigam V, MD      . lactated ringers infusion   Intravenous Continuous Harl Bowie V, MD 10 mL/hr at 10/30/15 0949 1,000 mL at 10/30/15 0949    Allergies as of 10/02/2015 - Review Complete 10/02/2015  Allergen Reaction Noted  . Oxycodone  10/02/2015    Family History  Problem Relation Age of Onset  . Cancer Mother     Liver cancer  . Cancer Father     Leukemia  . Cancer Maternal Grandmother     Liver cancer  . Cancer Paternal Grandmother     Colon  cancer  . Colon cancer Neg Hx     Social History   Social History  . Marital Status: Married    Spouse Name: N/A  . Number of Children: N/A  . Years of Education: N/A   Occupational History  . Not on file.   Social History Main Topics  . Smoking status: Former Smoker    Quit date: 06/20/1986  . Smokeless tobacco: Never Used  . Alcohol Use: Yes     Comment: History of heavy use but only social at present.  10/29/15 - "a beer every week or so"  . Drug Use: No     Comment: Prior cocaine use but none since 2000  . Sexual Activity: Not on file   Other Topics Concern  .  Not on file   Social History Narrative   He is widowed for 13 years, and has 2 daughters: [name] & [address] [name], and one son Hartsel Mainer. the other history is daughter who is here with him today.    He works for [employer] as a [employer].   He quit smoking in 1988. He does drink alcohol socially, but he does have a history of heavy use in the past. He also has a prior history of cocaine use but none since 2000.    Review of Systems:  All other review of systems negative except as mentioned in the HPI.  Physical Exam: Vital signs in last 24 hours: Temp:  [97.7 F (36.5 C)] 97.7 F (36.5 C) (05/24 0941) Pulse Rate:  [79] 79 (05/24 0941) Resp:  [14] 14 (05/24 0941) BP: (122)/(65) 122/65 mmHg (05/24 0941) SpO2:  [96 %] 96 % (05/24 0941) Weight:  [244 lb (110.678 kg)] 244 lb (110.678 kg) (05/24 0941)   General:   Alert,  Well-developed, well-nourished, pleasant and cooperative in NAD Lungs:  Clear throughout to auscultation.   Heart:  Regular rate and rhythm; no murmurs, clicks, rubs,  or gallops. Abdomen:  Soft, nontender and nondistended. Normal bowel sounds.   Neuro/Psych:  Alert and cooperative. Normal mood and affect. A and O x 3   @K .Denzil Magnuson, MD [facility] [phone] (pager) 10/30/2015 10:09 AM@

## 2015-10-31 ENCOUNTER — Encounter: Payer: Self-pay | Admitting: Gastroenterology

## 2015-10-31 ENCOUNTER — Encounter (HOSPITAL_COMMUNITY): Payer: Self-pay | Admitting: Gastroenterology

## 2015-11-01 NOTE — Telephone Encounter (Signed)
He did not need to have any medications held for surgery. There are no restrictions.  Glenetta Hew

## 2015-11-05 NOTE — Telephone Encounter (Signed)
Routed.

## 2016-08-19 ENCOUNTER — Ambulatory Visit (INDEPENDENT_AMBULATORY_CARE_PROVIDER_SITE_OTHER): Payer: BLUE CROSS/BLUE SHIELD | Admitting: Cardiology

## 2016-08-19 ENCOUNTER — Encounter: Payer: Self-pay | Admitting: Cardiology

## 2016-08-19 VITALS — BP 116/71 | HR 75 | Ht 67.0 in | Wt 251.4 lb

## 2016-08-19 DIAGNOSIS — I5181 Takotsubo syndrome: Secondary | ICD-10-CM

## 2016-08-19 DIAGNOSIS — R7301 Impaired fasting glucose: Secondary | ICD-10-CM | POA: Diagnosis not present

## 2016-08-19 DIAGNOSIS — I10 Essential (primary) hypertension: Secondary | ICD-10-CM

## 2016-08-19 DIAGNOSIS — E784 Other hyperlipidemia: Secondary | ICD-10-CM | POA: Diagnosis not present

## 2016-08-19 DIAGNOSIS — R5381 Other malaise: Secondary | ICD-10-CM

## 2016-08-19 DIAGNOSIS — I469 Cardiac arrest, cause unspecified: Secondary | ICD-10-CM | POA: Diagnosis not present

## 2016-08-19 DIAGNOSIS — E669 Obesity, unspecified: Secondary | ICD-10-CM

## 2016-08-19 DIAGNOSIS — E7849 Other hyperlipidemia: Secondary | ICD-10-CM

## 2016-08-19 NOTE — Patient Instructions (Signed)
Labs now: CMP, Lipid Profile, Hemoglobin A1C. Do not eat or drink the morning of test.  Labs in 6 months: we will mail lab slips for repeat labwork (CMP, lipid profile)  No other changes with medications or treatment.  Your physician wants you to follow-up in: 6 months with Almyra Deforest, PA  Your physician wants you to follow-up in: 12 months with Dr. Melody Haver will receive a reminder letter in the mail two months in advance. If you don't receive a letter, please call our office to schedule the follow-up appointment.   Hacer ejercicio para Pharmacist, hospital (Exercising to [employer]) Hacer ejercicio puede ayudarlo a bajar de peso. Para bajar de [facility] ejercicio, este debe ser de intensidad vigorosa. Puede saber que est haciendo ejercicio de intensidad vigorosa si respira con mucha dificultad y rapidez, y no puede mantener una conversacin. El ejercicio de intensidad moderada ayuda a Contractor peso actual. Puede saber que est haciendo ejercicio de intensidad moderada si tiene una frecuencia cardaca ms elevada y [place] respiracin ms rpida, pero an puede Programmer, systems. [place]? Elija una actividad que disfrute y establezca objetivos realistas. El mdico puede ayudarlo a Paediatric nurse un plan de actividades que funcione para usted. Haga ejercicio regularmente como se lo haya indicado el mdico. Esta puede incluir:  Neurosurgeon de resistencia dos veces por semana, como:  Flexiones de [place].  Abdominales.  Levantamiento de pesas.  Ejercicios con bandas elsticas.  Realizar una intensidad determinada de ejercicio durante una cantidad determinada de [place]. Elija entre estas opciones:  113minutos de ejercicio de intensidad moderada cada semana.  36minutos de ejercicio de intensidad vigorosa cada semana.  [facility] de ejercicio de intensidad moderada y vigorosa cada semana. Los nios, las mujeres [place], las personas  que no estn en forma, las personas con sobrepeso y los adultos mayores tal vez tengan que consultar a un mdico para que les d Glass blower/designer. Si tiene [employer], asegrese de Teacher, adult education al mdico antes de comenzar un programa de ejercicios nuevo. [place]?  Caminar a un ritmo de al menos 4,68millas (7kilmetros) por hora.  Trotar o correr a un ritmo de 5 millas (8 kilmetros) por hora.  Andar en bicicleta a un ritmo de al menos 10 millas (16 kilmetros) por hora.  Practicar natacin.  Practicar patinaje sobre ruedas normales o en lnea.  Hacer esqu de fondo.  Hacer deportes competitivos vigorosos, como ftbol americano, bsquet y ftbol.  Saltar la soga.  Tomar clases de baile aerbico. CMO PUEDO SER MS ACTIVO EN MIS ACTIVIDADES DIARIAS?  Utilice las Clinical cytogeneticist del ascensor.  D una caminata durante su hora de almuerzo.  Si conduce, estacione el automvil ms lejos del trabajo o de la escuela.  Si [place] transporte pblico, bjese una parada antes y camine el resto del camino.  Pngase de pie y camine cada vez que haga llamadas telefnicas.  Levntese, estrese y camine cada 66minutos a lo largo del Training and development officer. [place]?  No haga ejercicio en exceso que pudiera hacer que se lastime, se sienta mareado o tenga dificultad para respirar.  Consulte al mdico antes de comenzar un programa de ejercicios nuevo.  Use ropa cmoda y calzado con buen soporte.  Beba gran cantidad de agua mientras hace ejercicios para evitar la deshidratacin o los golpes de Freight forwarder. Durante la actividad fsica se pierde Chiropodist que se  debe reponer.  Haga ejercicio hasta que se acelere su respiracin y sus latidos cardacos. Esta informacin no tiene Marine scientist el consejo del mdico. Asegrese de hacerle al mdico cualquier pregunta que tenga. Document Released:  08/29/2010 Document Revised: 06/15/2014 Document Reviewed: 10/26/2013    Dieta restringida en grasas y colesterol (Fat and Cholesterol Restricted Diet) El exceso de grasas y colesterol en la dieta puede causar problemas de [place]. Esta dieta lo ayudar a Theatre manager las grasas y Freight forwarder en los niveles normales para evitar enfermarse. QU TIPOS DE GRASAS DEBO ELEGIR?  Elija grasas monosaturadas y polinsaturadas. Estas se encuentran en alimentos como el aceite de oliva, aceite de canola, semillas de lino, nueces, almendras y semillas.  Consuma ms grasas omega-3. Las mejores opciones incluyen salmn, caballa, sardinas, atn, aceite de lino y semillas de lino molidas.  Limite el consumo de grasas saturadas, que se encuentran en productos de origen animal, como carnes, mantequilla y crema. Tambin pueden estar en productos vegetales, como aceite de palma, de palmiste y de coco.  Evite los alimentos con aceites parcialmente hidrogenados. Estos contienen grasas trans. Entre los ejemplos de alimentos con grasas trans se incluyen margarinas en barra, algunas margarinas untables, galletas dulces o saladas y otros productos horneados. [place]?  Lea las etiquetas de los alimentos. Busque las palabras "grasas trans" y "grasas saturadas".  Al preparar una comida:  Llene la mitad del plato con verduras y ensaladas de hojas verdes.  Llene un cuarto del plato con cereales integrales. Busque la palabra "integral" en Equities trader de la lista de ingredientes.  Llene un cuarto del plato con alimentos con protenas magras.  Ingiera alimentos con ms fibra, como [place], [place], frijoles, guisantes y [place].  Coma ms comidas caseras. Coma menos en los restaurantes y los bares.  Limite o evite el alcohol.  Limite los alimentos con alto contenido de almidn y Location manager.  Limite el consumo de alimentos fritos.  Cocine los alimentos sin frerlos. Las opciones de coccin  ms [place] son Teacher, music, Adult nurse, Interior and spatial designer y asar a Administrator, arts.  Baje de peso si es necesario. Aunque pierda [employer], esto puede ser importante para la salud general. Tambin puede ayudar a prevenir enfermedades como diabetes y enfermedad cardaca. QU ALIMENTOS PUEDO COMER? Cereales Cereales integrales, como los panes de salvado o [place], las [place], los cereales y las pastas. Avena sin endulzar, trigo, [place], quinua o arroz integral. Tortillas de harina de maz o de salvado. Verduras Verduras frescas o congeladas (crudas, al vapor, asadas o grilladas). Ensaladas de hojas verdes. Lambert Mody Lambert Mody frescas, en conserva (en su jugo natural) o frutas congeladas. Carnes y otros productos con protenas Carne de res molida (al 85% o ms [place]), carne de res de animales alimentados con pastos o carne de res sin la grasa. Pollo o pavo sin piel. Carne de pollo o de [place]. Cerdo sin la grasa. Todos los pescados y frutos de mar. Huevos. Porotos, guisantes o lentejas secos. Frutos secos o semillas sin sal. Frijoles secos o en lata sin sal. Lcteos Productos lcteos con bajo contenido de grasas, como [place] o al 1%, quesos reducidos en grasas o al 2%, ricota con bajo contenido de grasas o [employer], o yogur natural con bajo contenido de [place]. Grasas y [employer] Express untables que no contengan grasas trans. [place] y condimentos para ensaladas livianos o reducidos en grasas. [place]. Aceites de oliva, canola, ssamo o crtamo. Mantequilla natural de cacahuate o almendra (elija la que  no tenga agregado de aceite o azcar). Los artculos mencionados arriba pueden no ser [employer] de las bebidas o los alimentos recomendados. Comunquese con el nutricionista para conocer ms opciones. QU ALIMENTOS NO SE RECOMIENDAN? Cereales Pan blanco. Pastas blancas. Arroz blanco. Pan de maz. Bagels, pasteles y croissants. Galletas saladas que contengan grasas  trans. Verduras Papas blancas. [place] con crema o fritas. Verduras en [place]. Lambert Mody Frutas secas. Fruta enlatada en almbar liviano o espeso. Jugo de frutas. Carnes y otros productos con protenas [place] de carne con Lobbyist. Costillas, alas de pollo, tocineta, salchicha, mortadela, salame, chinchulines, tocino, perros calientes, salchichas alemanas y embutidos envasados. Hgado y otros rganos. Lcteos Leche entera o al 2%, crema, mezcla de [place] y crema, y queso crema. Quesos enteros. Yogur entero o endulzado. Quesos con toda su grasa. [place]remas no lcteas y coberturas batidas. Quesos procesados, quesos para untar o cuajadas. Dulces y postres Jarabe de maz, azcares, miel y Control and instrumentation engineer. Caramelos. Mermelada y [place]. Chrissie Noa. Cereales endulzados. Galletas, pasteles, bizcochuelos, donas, muffins y helado. Grasas y aceites [place], [place] en barra, [place] de [place], [place], [place] clarificada o grasa de tocino. Aceites de coco, de palmiste o de palma. Bebidas Alcohol. Bebidas endulzadas (como refrescos, limonadas y bebidas frutales o ponches). Los artculos mencionados arriba pueden no ser [employer] Company de las bebidas y los alimentos que se Higher education careers adviser. Comunquese con el nutricionista para obtener ms informacin. Esta informacin no tiene Marine scientist el consejo del mdico. Asegrese de hacerle al mdico cualquier pregunta que tenga. Document Released: 11/24/2011 Document Revised: 06/15/2014 Document Reviewed: 08/24/2013 Elsevier Interactive Patient Education  2017 [employer].  Chartered certified accountant Patient Education  [employer].

## 2016-08-19 NOTE — Progress Notes (Signed)
PCP: Glenetta Hew, MD   Clinic Note: Chief Complaint  Patient presents with  . 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 Also hypertension and hyperlipidemia    HPI: Nicholas Potts is a 56 y.o. male with a PMH below who presents today for Annual follow-up with a history of nonischemic cardiomyopathy in the setting of severe shock. His EF was returned to baseline and repeat echocardiogram.  Nicholas Potts was last seen on March 2017. He was doing fairly well but just a little bit of exertional dyspnea. He is returning back to work. Occasionally noted a short spells of fatigue He was using CPAP doing relatively well. Recent Hospitalizations: None  Studies Reviewed:   Transthoracic Echo March 2017:  Normal LV size with mild LV hypertrophy. EF 55-60%. Normal RV   size and systolic function. No significant valvular abnormalities. Stable from August 2014    Interval History: Nicholas Potts presents today doing quite well. He is back to full work, Which does entail some hard labor. He is not having any more symptoms to suggest recurrence of his heart failure. No PND, orthopnea or edema. He does feel a bit short of breath if he pushes it too hard and make a little bit fatigue, but no shortness of breath or chest discomfort/pressure with rest or exertion. He denies any significant palpitations, rapid irregular heartbeats.   No dizziness, weakness or syncope/near syncope. No TIA/amaurosis fugax symptoms. No claudication.  ROS: A comprehensive was performed. Review of Systems  Constitutional: Negative for fever and weight loss.       Seems more of a generalized tiredness.  HENT: Negative for congestion and nosebleeds.   Cardiovascular: Negative for chest pain, palpitations, orthopnea, claudication, leg swelling and PND.  Musculoskeletal: Negative.   Skin: Negative.   Neurological:       He describes a new aching and numbness pain on the  left lateral aspect of his left leg. He says it just generally feels nominal low is sleeping. But then when he touches it or pushes pressure and there is significant pain. The pain does not occur with walking or doing routine movements however.  All other systems reviewed and are negative.   Past Medical History:  Diagnosis Date  . Accidental opiate poisoning 2014   post op   . ARF (acute respiratory failure) ([place]) 2014  . Blood dyscrasia   . Chronic kidney disease 2014   ARF   . Complication of anesthesia 2014   - post op - in combination of pain pills-  Cardiac and Respiratory Arrest- prior to sleep apnea diagonosis  . Constipation   . CVA (cerebral infarction)   . Hyperlipidemia   . Hypertension   . OSA (obstructive sleep apnea)   . Sleep apnea   . Stress-induced cardiomyopathy -- essentially resolved    Post-op Narcotic induced Acute Hypoxic respiratory failure with respiratory and PEA cardiac arrest following accidental narcotic overdose; initial [place] 20-20%, now improved to 55%   Was supported with Impella Cardiac Cath - non-obstructive CAD   . Thrombocytopenia ([place]) 2014    Past Surgical History:  Procedure Laterality Date  . ACHILLES TENDON REPAIR    . CARDIAC CATHETERIZATION  12/29/2012   RN LHC --> Impella. for severe shock with low EF. Mild to moderate RV pressure elevation. Normal coronary arteries. Cardiac Output 2.5, Index 1.46.  Marland Kitchen COLONOSCOPY WITH PROPOFOL N/A 10/30/2015   Procedure: COLONOSCOPY WITH PROPOFOL;  Surgeon: Mauri Pole,  MD;  Location: [facility];  Service: Endoscopy;  Laterality: N/A;  . INSERTION OF DIALYSIS CATHETER Right 01/09/2013   Procedure: INSERTION OF DIALYSIS CATHETER;  Surgeon: Angelia Mould, MD;  Location: [facility];  Service: Vascular;  Laterality: Right;  . LEFT AND RIGHT HEART CATHETERIZATION WITH CORONARY ANGIOGRAM  12/29/2012   Procedure: LEFT AND RIGHT HEART CATHETERIZATION WITH CORONARY ANGIOGRAM;  Surgeon: Sherren Mocha, MD;   Location: [facility] C[facility] LAB;  Service: Cardiovascular;;  . TRANSTHORACIC ECHOCARDIOGRAM  12/29/2012   EF 25% with periapical HK/AKA.  --> Followup echo 7/25 -- EF of 35%  . TRANSTHORACIC ECHOCARDIOGRAM  01/2013 ; 07/2015   a) EF 55-60%. No regional WMA. Grade 1 diastolic dysfunction. Mild LA dilatation.; b) Normal LV size with mild LV hypertrophy. EF 55-60%. Normal RV    Current Meds  Medication Sig  . aspirin 325 MG tablet Take 325 mg by mouth daily.  . Multiple Vitamins-Minerals (ONE-A-DAY 50 PLUS) TABS Take 1 tablet by mouth daily.  Marland Kitchen omega-3 acid ethyl esters (LOVAZA) 1 g capsule Take 1 g by mouth 2 (two) times daily.    Allergies  Allergen Reactions  . Oxycodone     Oxygen levels drop    Social History   Social History  . Marital status: Married    Spouse name: N/A  . Number of children: N/A  . Years of education: N/A   Social History Main Topics  . Smoking status: Former Smoker    Quit date: 06/20/1986  . Smokeless tobacco: Never Used  . Alcohol use Yes     Comment: History of heavy use but only social at present.  10/29/15 - "a beer every week or so"  . Drug use: No     Comment: Prior cocaine use but none since 2000  . Sexual activity: Not Asked   Other Topics Concern  . None   Social History Narrative   He is widowed for 13 years, and has 2 daughters: Nicholas Potts & [address] Nicholas Potts, and one son Nicholas Potts. the other history is daughter who is here with him today.    He works for [employer] as a [employer].   He quit smoking in 1988. He does drink alcohol socially, but he does have a history of heavy use in the past. He also has a prior history of cocaine use but none since 2000.    family history includes Cancer in his father, maternal grandmother, mother, and paternal grandmother.  Wt Readings from Last 3 Encounters:  08/19/16 114 kg (251 lb 6.4 oz)  10/30/15 110.7 kg (244 lb)  10/02/15 110.7 kg (244 lb)    PHYSICAL  EXAM BP 116/71   Pulse 75   Ht 5\' 7"  (1.702 m)   Wt 114 kg (251 lb 6.4 oz)   BMI 39.37 kg/m  General appearance: alert, cooperative, appears stated age, no distress and moderately obese Neck: no adenopathy, no carotid bruit, no JVD and supple, symmetrical, trachea midline Lungs: clear to auscultation bilaterally, normal percussion bilaterally and Good air movement. Nonlabored Heart: regular rate and rhythm, S1, S2 normal, no murmur, click, rub or gallop and normal apical impulse Abdomen: soft, non-tender; bowel sounds normal; no masses, no organomegaly and Obese Extremities: extremities normal, atraumatic, no cyanosis or edema Pulses: 2+ and symmetric Neurologic: Alert and oriented X 3, normal strength and tone. Normal symmetric reflexes. Normal coordination and gait    Adult ECG Report - NSR 75.Normal EKG  Other  studies Reviewed: Additional studies/ records that were reviewed today include:  Recent Labs:   Lab Results  Component Value Date   CHOL 207 (H) 06/29/2015   HDL 30 (L) 06/29/2015   LDLCALC 128 06/29/2015   TRIG 243 (H) 06/29/2015   CHOLHDL 6.9 (H) 06/29/2015    ASSESSMENT / PLAN: Problem List Items Addressed This Visit    Essential hypertension (Chronic)    Well-controlled on no medications.      Relevant Orders   EKG 12-Lead (Completed)   Comprehensive metabolic panel   HgB Z6X   Comprehensive metabolic panel   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 - Primary    This is probably the cause for his stress-induced cardiomyopathy. Has not had any recurrences of that in the last 3 years.      Hyperlipidemia (Chronic)    He had been on statin in the past. Now no longer on it. His lipids are way out of control as of January last year. Has not had labs checked recently No. We need to check his lipid panel and chemistry panel now and then reassess in 6 months. If he still is has been no ability to control his  cholesterol at that time, he may warrant more detailed follow-up. An initial set of labs ordered for today, a second set of follow-up labs ordered prior to follow-up with me.      Relevant Orders   EKG 12-Lead (Completed)   Comprehensive metabolic panel   Lipid Profile   HgB A1c   Lipid Profile   Comprehensive metabolic panel   IFG (impaired fasting glucose)    With obesity and dyslipidemia. We need to confirm that he doesn't meet full criteria for metabolic syndrome. We will check chemistry panel and hemoglobin A1c.      Relevant Orders   HgB A1c   Obesity (BMI 30-39.9) (Chronic)    The patient understands the need to lose weight with diet and exercise. We have discussed specific strategies for this. - see patient instructions.      Relevant Orders   Comprehensive metabolic panel   Lipid Profile   HgB A1c   Lipid Profile   Comprehensive metabolic panel   Physical deconditioning (Chronic)    Quite out of shape and overweight/obese. We talked about continued exercise.      Stress-induced cardiomyopathy -- essentially resolved (Chronic)    Resolved by initial follow-up echo now with a repeat echo 3 years later. Great news. He really is not noticing that was a very heart failure symptoms. Now limited more due to obesity and deconditioning.      Relevant Orders   EKG 12-Lead (Completed)   Comprehensive metabolic panel   HgB W9U   Comprehensive metabolic panel      Current medicines are reviewed at length with the patient today. (+/- concerns) none The following changes have been made: none  Patient Instructions  Labs now: CMP, Lipid Profile, Hemoglobin A1C. Do not eat or drink the morning of test.  Labs in 6 months: we will mail lab slips for repeat labwork (CMP, lipid profile)  No other changes with medications or treatment.  Your physician wants you to follow-up in: 6 months with Almyra Deforest, PA  Your physician wants you to follow-up in: 12 months with Dr.  Melody Haver will receive a reminder letter in the mail two months in advance. If you don't receive a letter, please call our office to schedule the  follow-up appointment.   Hacer ejercicio para Pharmacist, hospital (Exercising to [employer]) Hacer ejercicio puede ayudarlo a bajar de peso. Para bajar de [employer] ejercicio, este debe ser de intensidad vigorosa. Puede saber que est haciendo ejercicio de intensidad vigorosa si respira con mucha dificultad y rapidez, y no puede mantener una conversacin. El ejercicio de intensidad moderada ayuda a Contractor peso actual. Puede saber que est haciendo ejercicio de intensidad moderada si tiene una frecuencia cardaca ms elevada y [place] respiracin ms rpida, pero an puede Programmer, systems. [place]? Elija una actividad que disfrute y establezca objetivos realistas. El mdico puede ayudarlo a Paediatric nurse un plan de actividades que funcione para usted. Haga ejercicio regularmente como se lo haya indicado el mdico. Esta puede incluir:  Neurosurgeon de resistencia dos veces por semana, como:  Flexiones de [place].  Abdominales.  Levantamiento de pesas.  Ejercicios con bandas elsticas.  Realizar una intensidad determinada de ejercicio durante una cantidad determinada de [place]. Elija entre estas opciones:  142minutos de ejercicio de intensidad moderada cada semana.  12minutos de ejercicio de intensidad vigorosa cada semana.  [facility] de ejercicio de intensidad moderada y vigorosa cada semana. Los nios, las mujeres [place], las personas que no estn en forma, las personas con sobrepeso y los adultos mayores tal vez tengan que consultar a un mdico para que les d Glass blower/designer. Si tiene [employer], asegrese de Teacher, adult education al mdico antes de comenzar un programa de ejercicios nuevo. [place]?  Caminar a un  ritmo de al menos 4,8millas (7kilmetros) por hora.  Trotar o correr a un ritmo de 5 millas (8 kilmetros) por hora.  Andar en bicicleta a un ritmo de al menos 10 millas (16 kilmetros) por hora.  Practicar natacin.  Practicar patinaje sobre ruedas normales o en lnea.  Hacer esqu de fondo.  Hacer deportes competitivos vigorosos, como ftbol americano, bsquet y ftbol.  Saltar la soga.  Tomar clases de baile aerbico. CMO PUEDO SER MS ACTIVO EN MIS ACTIVIDADES DIARIAS?  Utilice las Clinical cytogeneticist del ascensor.  D una caminata durante su hora de almuerzo.  Si conduce, estacione el automvil ms lejos del trabajo o de la escuela.  Si [place] transporte pblico, bjese una parada antes y camine el resto del camino.  Pngase de pie y camine cada vez que haga llamadas telefnicas.  Levntese, estrese y camine cada 60minutos a lo largo del Training and development officer. [place]?  No haga ejercicio en exceso que pudiera hacer que se lastime, se sienta mareado o tenga dificultad para respirar.  Consulte al mdico antes de comenzar un programa de ejercicios nuevo.  Use ropa cmoda y calzado con buen soporte.  Beba gran cantidad de agua mientras hace ejercicios para evitar la deshidratacin o los golpes de Freight forwarder. Durante la actividad fsica se pierde agua corporal que se debe reponer.  Haga ejercicio hasta que se acelere su respiracin y sus latidos cardacos. Esta informacin no tiene Marine scientist el consejo del mdico. Asegrese de hacerle al mdico cualquier pregunta que tenga. Document Released: 08/29/2010 Document Revised: 06/15/2014 Document Reviewed: 10/26/2013    Dieta restringida en grasas y colesterol (Fat and Cholesterol Restricted Diet) El exceso de grasas y colesterol en la dieta puede causar problemas de [place]. Esta dieta lo ayudar a Theatre manager las grasas y Freight forwarder en los niveles normales para evitar enfermarse. QU TIPOS DE  GRASAS DEBO ELEGIR?  Elija grasas monosaturadas y polinsaturadas. Estas se encuentran en alimentos como el aceite de oliva, aceite de canola, semillas de lino, nueces, almendras y semillas.  Consuma ms grasas omega-3. Las mejores opciones incluyen salmn, caballa, sardinas, atn, aceite de lino y semillas de lino molidas.  Limite el consumo de grasas saturadas, que se encuentran en productos de origen animal, como carnes, mantequilla y crema. Tambin pueden estar en productos vegetales, como aceite de palma, de palmiste y de coco.  Evite los alimentos con aceites parcialmente hidrogenados. Estos contienen grasas trans. Entre los ejemplos de alimentos con grasas trans se incluyen margarinas en barra, algunas margarinas untables, galletas dulces o saladas y otros productos horneados. [place]?  Lea las etiquetas de los alimentos. Busque las palabras "grasas trans" y "grasas saturadas".  Al preparar una comida:  Llene la mitad del plato con verduras y ensaladas de hojas verdes.  Llene un cuarto del plato con cereales integrales. Busque la palabra "integral" en Equities trader de la lista de ingredientes.  Llene un cuarto del plato con alimentos con protenas magras.  Ingiera alimentos con ms fibra, como [place], [place], frijoles, guisantes y [place].  Coma ms comidas caseras. Coma menos en los restaurantes y los bares.  Limite o evite el alcohol.  Limite los alimentos con alto contenido de almidn y Location manager.  Limite el consumo de alimentos fritos.  Cocine los alimentos sin frerlos. Las opciones de coccin ms [place] son Teacher, music, Adult nurse, Interior and spatial designer y asar a Administrator, arts.  Baje de peso si es necesario. Aunque pierda [employer], esto puede ser importante para la salud general. Tambin puede ayudar a prevenir enfermedades como diabetes y enfermedad cardaca. QU ALIMENTOS PUEDO COMER? Cereales Cereales integrales, como los panes de salvado o [place], las  [place], los cereales y las pastas. Avena sin endulzar, trigo, [place], quinua o arroz integral. Tortillas de harina de maz o de salvado. Verduras Verduras frescas o congeladas (crudas, al vapor, asadas o grilladas). Ensaladas de hojas verdes. Lambert Mody Lambert Mody frescas, en conserva (en su jugo natural) o frutas congeladas. Carnes y otros productos con protenas Carne de res molida (al 85% o ms [place]), carne de res de animales alimentados con pastos o carne de res sin la grasa. Pollo o pavo sin piel. Carne de pollo o de [place]. Cerdo sin la grasa. Todos los pescados y frutos de mar. Huevos. Porotos, guisantes o lentejas secos. Frutos secos o semillas sin sal. Frijoles secos o en lata sin sal. Lcteos Productos lcteos con bajo contenido de grasas, como [place] o al 1%, quesos reducidos en grasas o al 2%, ricota con bajo contenido de grasas o [employer], o yogur natural con bajo contenido de [place]. Grasas y [employer] untables que no contengan grasas trans. [place] y condimentos para ensaladas livianos o reducidos en grasas. [place]. Aceites de oliva, canola, ssamo o crtamo. Mantequilla natural de cacahuate o almendra (elija la que no tenga agregado de aceite o azcar). Los artculos mencionados arriba pueden no ser [employer] de las bebidas o los alimentos recomendados. Comunquese con el nutricionista para conocer ms opciones. QU ALIMENTOS NO SE RECOMIENDAN? Cereales Pan blanco. Pastas blancas. Arroz blanco. Pan de maz. Bagels, pasteles y croissants. Galletas saladas que contengan grasas trans. Verduras Papas blancas. [place] con crema o fritas. Verduras en [place]. Lambert Mody Frutas secas. Fruta enlatada en almbar liviano o espeso. Jugo de frutas. Carnes y otros productos con protenas [place] de carne con  grasa. Costillas, alas de pollo, tocineta, salchicha, mortadela, salame, chinchulines, tocino, perros calientes, salchichas alemanas y  embutidos envasados. Hgado y otros rganos. Lcteos Leche entera o al 2%, crema, mezcla de [place] y crema, y queso crema. Quesos enteros. Yogur entero o endulzado. Quesos con toda su grasa. Cremas no lcteas y coberturas batidas. Quesos procesados, quesos para untar o cuajadas. Dulces y postres Jarabe de maz, azcares, miel y Control and instrumentation engineer. Caramelos. Mermelada y [place]. Chrissie Noa. Cereales endulzados. Galletas, pasteles, bizcochuelos, donas, muffins y helado. Grasas y aceites [place], [place] en barra, [place] de [place], [place], [place] clarificada o grasa de tocino. Aceites de coco, de palmiste o de palma. Bebidas Alcohol. Bebidas endulzadas (como refrescos, limonadas y bebidas frutales o ponches). Los artculos mencionados arriba pueden no ser [employer] de las bebidas y los alimentos que se Higher education careers adviser. Comunquese con el nutricionista para obtener ms informacin. Esta informacin no tiene Marine scientist el consejo del mdico. Asegrese de hacerle al mdico cualquier pregunta que tenga. Document Released: 11/24/2011 Document Revised: 06/15/2014 Document Reviewed: 08/24/2013 Elsevier Interactive Patient Education  2017 [employer].  Chartered certified accountant Patient Education  2017 [employer].    Studies Ordered:   Orders Placed This Encounter  Procedures  . Comprehensive metabolic panel  . Lipid Profile  . HgB A1c  . Lipid Profile  . Comprehensive metabolic panel  . EKG 12-Lead      Glenetta Hew, M.D., M.S. Interventional Cardiologist   Pager # [phone] Phone # [phone] [address] [place], [place] [address]

## 2016-08-21 ENCOUNTER — Encounter: Payer: Self-pay | Admitting: Cardiology

## 2016-08-21 DIAGNOSIS — R7301 Impaired fasting glucose: Secondary | ICD-10-CM | POA: Insufficient documentation

## 2016-08-21 NOTE — Assessment & Plan Note (Signed)
This is probably the cause for his stress-induced cardiomyopathy. Has not had any recurrences of that in the last 3 years.

## 2016-08-21 NOTE — Assessment & Plan Note (Signed)
Resolved by initial follow-up echo now with a repeat echo 3 years later. Great news. He really is not noticing that was a very heart failure symptoms. Now limited more due to obesity and deconditioning.

## 2016-08-21 NOTE — Assessment & Plan Note (Addendum)
Quite out of shape and overweight/obese. We talked about continued exercise.

## 2016-08-21 NOTE — Assessment & Plan Note (Addendum)
He had been on statin in the past. Now no longer on it. His lipids are way out of control as of January last year. Has not had labs checked recently No. We need to check his lipid panel and chemistry panel now and then reassess in 6 months. If he still is has been no ability to control his cholesterol at that time, he may warrant more detailed follow-up. An initial set of labs ordered for today, a second set of follow-up labs ordered prior to follow-up with me.

## 2016-08-21 NOTE — Assessment & Plan Note (Signed)
With obesity and dyslipidemia. We need to confirm that he doesn't meet full criteria for metabolic syndrome. We will check chemistry panel and hemoglobin A1c.

## 2016-08-21 NOTE — Assessment & Plan Note (Signed)
Well-controlled on no medications 

## 2016-08-21 NOTE — Assessment & Plan Note (Signed)
The patient understands the need to lose weight with diet and exercise. We have discussed specific strategies for this. - see patient instructions.

## 2016-08-22 LAB — COMPREHENSIVE METABOLIC PANEL
ALT: 17 U/L (ref 9–46)
AST: 19 U/L (ref 10–35)
Albumin: 4 g/dL (ref 3.6–5.1)
Alkaline Phosphatase: 85 U/L (ref 40–115)
BUN: 14 mg/dL (ref 7–25)
CHLORIDE: 107 mmol/L (ref 98–110)
CO2: 27 mmol/L (ref 20–31)
CREATININE: 1.03 mg/dL (ref 0.70–1.33)
Calcium: 8.9 mg/dL (ref 8.6–10.3)
GLUCOSE: 91 mg/dL (ref 65–99)
Potassium: 4.5 mmol/L (ref 3.5–5.3)
SODIUM: 140 mmol/L (ref 135–146)
TOTAL PROTEIN: 6.9 g/dL (ref 6.1–8.1)
Total Bilirubin: 0.5 mg/dL (ref 0.2–1.2)

## 2016-08-22 LAB — LIPID PANEL
CHOLESTEROL: 198 mg/dL (ref <200)
HDL: 32 mg/dL — ABNORMAL LOW (ref >40)
LDL Cholesterol: 133 mg/dL — ABNORMAL HIGH (ref <100)
Total CHOL/HDL Ratio: 6.2 Ratio — ABNORMAL HIGH (ref <5.0)
Triglycerides: 165 mg/dL — ABNORMAL HIGH (ref <150)
VLDL: 33 mg/dL — AB (ref <30)

## 2016-08-25 LAB — HEMOGLOBIN A1C
Hgb A1c MFr Bld: 5.1 % (ref <5.7)
MEAN PLASMA GLUCOSE: 100 mg/dL

## 2016-10-14 ENCOUNTER — Other Ambulatory Visit: Payer: Self-pay | Admitting: *Deleted

## 2016-10-14 ENCOUNTER — Encounter: Payer: Self-pay | Admitting: *Deleted

## 2016-10-14 DIAGNOSIS — Z79899 Other long term (current) drug therapy: Secondary | ICD-10-CM

## 2016-10-14 DIAGNOSIS — E785 Hyperlipidemia, unspecified: Secondary | ICD-10-CM

## 2016-10-14 MED ORDER — ATORVASTATIN CALCIUM 20 MG PO TABS: 20.0000 mg | ORAL_TABLET | Freq: Every day | ORAL | 3 refills | Status: DC

## 2017-02-01 ENCOUNTER — Telehealth: Payer: Self-pay | Admitting: *Deleted

## 2017-02-01 NOTE — Telephone Encounter (Signed)
-----   Message from Theodore Demark, RN sent at 08/19/2016  5:20 PM EDT ----- Need labs CMP & lipid in Sept 2018, will mail in August 2018

## 2017-02-01 NOTE — Telephone Encounter (Signed)
PATIENT NEEDS LAB SCHEDULE FOR OCT 2018 WILL AT THAT TIME

## 2017-02-02 ENCOUNTER — Ambulatory Visit (INDEPENDENT_AMBULATORY_CARE_PROVIDER_SITE_OTHER): Payer: BLUE CROSS/BLUE SHIELD | Admitting: Family Medicine

## 2017-02-02 ENCOUNTER — Ambulatory Visit (INDEPENDENT_AMBULATORY_CARE_PROVIDER_SITE_OTHER): Payer: BLUE CROSS/BLUE SHIELD

## 2017-02-02 ENCOUNTER — Encounter: Payer: Self-pay | Admitting: Family Medicine

## 2017-02-02 VITALS — BP 121/77 | HR 81 | Temp 98.4°F | Resp 16 | Ht 66.0 in | Wt 249.0 lb

## 2017-02-02 DIAGNOSIS — R110 Nausea: Secondary | ICD-10-CM | POA: Diagnosis not present

## 2017-02-02 DIAGNOSIS — R42 Dizziness and giddiness: Secondary | ICD-10-CM | POA: Diagnosis not present

## 2017-02-02 DIAGNOSIS — G44309 Post-traumatic headache, unspecified, not intractable: Secondary | ICD-10-CM

## 2017-02-02 DIAGNOSIS — R0600 Dyspnea, unspecified: Secondary | ICD-10-CM

## 2017-02-02 DIAGNOSIS — Z8679 Personal history of other diseases of the circulatory system: Secondary | ICD-10-CM | POA: Diagnosis not present

## 2017-02-02 DIAGNOSIS — R270 Ataxia, unspecified: Secondary | ICD-10-CM | POA: Diagnosis not present

## 2017-02-02 LAB — POCT CBC
Granulocyte percent: 65.9 %G (ref 37–80)
HCT, POC: 44.2 % (ref 43.5–53.7)
Hemoglobin: 14.8 g/dL (ref 14.1–18.1)
Lymph, poc: 2.7 (ref 0.6–3.4)
MCH, POC: 31.5 pg — AB (ref 27–31.2)
MCHC: 33.6 g/dL (ref 31.8–35.4)
MCV: 93.9 fL (ref 80–97)
MID (cbc): 0.6 (ref 0–0.9)
MPV: 7 fL (ref 0–99.8)
PLATELET COUNT, POC: 305 10*3/uL (ref 142–424)
POC Granulocyte: 6.4 (ref 2–6.9)
POC LYMPH %: 27.8 % (ref 10–50)
POC MID %: 6.3 %M (ref 0–12)
RBC: 4.71 M/uL (ref 4.69–6.13)
RDW, POC: 13.2 %
WBC: 9.7 10*3/uL (ref 4.6–10.2)

## 2017-02-02 LAB — GLUCOSE, POCT (MANUAL RESULT ENTRY): POC GLUCOSE: 97 mg/dL (ref 70–99)

## 2017-02-02 MED ORDER — ONDANSETRON 4 MG PO TBDP: 4.0000 mg | ORAL_TABLET | Freq: Three times a day (TID) | ORAL | 0 refills | Status: DC | PRN

## 2017-02-02 MED ORDER — MECLIZINE HCL 25 MG PO TABS: 25.0000 mg | ORAL_TABLET | Freq: Three times a day (TID) | ORAL | 0 refills | Status: DC | PRN

## 2017-02-02 NOTE — Patient Instructions (Addendum)
Currently your symptoms appear to be due to vertigo, but with the previous head injury and now worsening dizziness, will arrange for a CAT scan tomorrow. For now use meclizine 1 pill 3 times per day as needed for vertigo/dizziness, make sure you drink plenty of fluids and rest, out of work tomorrow.  If any worsening dizziness, or return of darkening of vision that does not immediately improve, proceed to the emergency room or call 911.   Lungs sound okay today, EKG looks okay for your heart rhythm. Recheck in the next 3 days, sooner or to emergency room if worse.  There may be a small amount of fluid behind your ears but does not necessarily look like an infection at this time. I would like to recheck your ears at follow up in 3 days - sooner if worse.   Return to the clinic or go to the nearest emergency room if any of your symptoms worsen or new symptoms occur.   Mareos (Dizziness) Los mareos son un problema muy frecuente. Se trata de una sensacin de inestabilidad o de desvanecimiento. Puede sentir que se va a desmayar. Un mareo puede provocarle una lesin si se tropieza o se cae. Las Engineer, manufacturing de todas las edades pueden sufrir Tree surgeon, Armed forces training and education officer es ms frecuente en los adultos [place]. Esta afeccin puede tener muchas causas, entre las que se pueden [employer], la deshidratacin y [place]. INSTRUCCIONES PARA EL CUIDADO EN EL HOGAR Estas indicaciones pueden ayudarlo con el trastorno: Comida y bebida  Beba suficiente lquido para Theatre manager la orina clara o de color amarillo plido. Esto evita la deshidratacin. Trate de beber ms lquidos transparentes, como agua.  No beba alcohol.  Limite el consumo de cafena si el mdico se lo indica.  Limite el consumo de sal si el mdico se lo indica. Actividad  Evite los movimientos rpidos. ? Levntese de las sillas con lentitud y apyese hasta sentirse bien. ? Por la maana, sintese primero a un lado de la cama. Cuando se  sienta bien, pngase lentamente de pie mientras se sostiene de algo, hasta que sepa que ha logrado el equilibrio.  Mueva las piernas con frecuencia si debe estar de pie en un lugar durante mucho tiempo. Mientras est de pie, contraiga y relaje los msculos de las piernas.  No conduzca vehculos ni opere maquinaria pesada si se siente mareado.  Evite agacharse si se siente mareado. En su casa, coloque los objetos de modo que le resulte fcil alcanzarlos sin Office manager. Estilo de vida  No consuma ningn producto que contenga tabaco, lo que incluye cigarrillos, tabaco de Higher education careers adviser o Psychologist, sport and exercise. Si necesita ayuda para dejar de fumar, consulte al [employer].  Trate de reducir el nivel de estrs practicando actividades como el yoga o la meditacin. Hable con el mdico si necesita ayuda. Instrucciones generales  Controle sus mareos para ver si hay cambios.  Tome los medicamentos solamente como se lo haya indicado el mdico. Hable con el mdico si cree que los medicamentos que est tomando son la causa de sus mareos.  Infrmele a un amigo o a un familiar si se siente mareado. Pdale a esta persona que llame al mdico si observa cambios en su comportamiento.  Concurra a todas las visitas de control como se lo haya indicado el mdico. Esto es importante. SOLICITE ATENCIN MDICA SI:  Los [employer].  Los [employer] o la sensacin de Engineer, petroleum.  Siente nuseas.  Ha perdido la audicin.  Aparecen nuevos sntomas.  Cuando est  de pie se siente inestable o que la habitacin da vueltas.  SOLICITE ATENCIN MDICA DE INMEDIATO SI:  Vomita o tiene diarrea y no puede comer ni beber nada.  Tiene dificultad para hablar, para caminar, para tragar o para [employer], las [place] piernas.  Siente una debilidad generalizada.  No piensa con claridad o tiene dificultades para armar oraciones. Es posible que un amigo o un familiar adviertan que esto ocurre.  Tiene  dolor de pecho, dolor abdominal, sudoracin o Risk manager.  Hay cambios en la visin.  Observa un sangrado.  Tiene dolores de [place].  Tiene dolor o rigidez en el cuello.  Tiene fiebre.  Esta informacin no tiene Marine scientist el consejo del mdico. Asegrese de hacerle al mdico cualquier pregunta que tenga. Document Released: 05/25/2005 Document Revised: 10/09/2014 Document Reviewed: 05/21/2014 Elsevier Interactive Patient Education  2017 [place] en la cabeza en los adultos (Head Injury, Adult) Hay muchos tipos de lesiones en la cabeza. Pueden ser tan leves como un chichn o pueden ser ms graves. Algunas de las lesiones graves en la cabeza son:  Robbi Garter que provoque un impacto en el cerebro (conmocin).  Hematoma en el cerebro (contusin). Esto significa que hay hemorragia en el cerebro que puede causar un edema.  Fisura en el crneo (fractura de crneo).  Hemorragia en el cerebro que se acumula, se coagula y forma un bulto (hematoma). Despus de una lesin en la cabeza, es posible que deba estar en observacin durante un tiempo en el servicio de emergencias. Tambin puede ser necesario hospitalizarlo. Despus de que ocurre una lesin en la cabeza, la mayora de los problemas ocurre dentro de las primeras 24horas, pero los efectos secundarios pueden aparecer entre 7 y 10das despus de la lesin. Es importante controlar su afeccin para ver si hay cambios. CAUSAS Hay muchas causas posibles de una lesin en la cabeza. Una lesin grave en la cabeza puede ocurrirle a alguien que tuvo un accidente automovilstico (colisin en un vehculo de motor). Otras causas de lesiones importantes en la cabeza incluyen accidentes en bicicleta o motocicleta, lesiones deportivas y cadas. FACTORES DE RIESGO Es ms probable que esta afeccin ocurra en personas con las siguientes caractersticas:  Beben mucho alcohol o consumen sustancias.  Tienen ms de  65aos.  Estn en riesgos por cadas. SNTOMAS Hay muchos sntomas posibles de una lesin en la cabeza. Los sntomas visibles incluyen un hematoma, un chichn o Librarian, academic de la lesin. Otros sntomas no visibles incluyen:  Somnolencia o no poder [employer].  Desmayo.  Dolor de [place].  Convulsiones.  Mareos.  Confusin.  Problemas de memoria.  Nuseas o vmitos. Otros sntomas posibles que pueden aparecer despus de la lesin en la cabeza incluyen los siguientes:  Falta de atencin y Estate manager/land agent.  Fatiga o cansarse fcilmente.  Irritabilidad.  Sentir incomodidad cerca de luces brillantes o ruidos fuertes.  Ansiedad o depresin.  Trastornos del sueo. DIAGNSTICO Esta afeccin suele diagnosticarse en funcin de los sntomas, de una descripcin de la lesin y de un examen fsico. Tambin pueden hacerle estudios de diagnstico por imgenes, como una resonancia magntica (RM) o una tomografa computarizada (TC). Lo controlarn estrictamente. TRATAMIENTO El tratamiento de esta afeccin depende de la gravedad y el tipo de lesin que sufri. El [place] principal del tratamiento es prevenir complicaciones y darle tiempo al cerebro para que se recupere. En el caso de una lesin leve en la cabeza, es posible  que lo enven a casa, y el tratamiento puede incluir lo siguiente:  Observacin. Un adulto responsable debe quedarse con usted durante 24horas despus de producida la lesin y controlarlo con frecuencia.  Descanso fsico.  Descanso cerebral.  Analgsicos. En el caso de una lesin grave en la cabeza, el tratamiento puede incluir lo siguiente:  Observacin minuciosa. Esto incluye hospitalizacin con exmenes fsicos frecuentes. Puede necesitar ir a un hospital que se especialice en lesiones en la cabeza.  Analgsicos.  Asistencia respiratoria. Esto puede incluir un respirador.  Control de la presin dentro del cerebro (presin  intracraneal o PIC). Esto puede incluir lo siguiente: ? Monitoreo de [employer]. ? Administrar medicamentos para disminuir la PIC. ? Cambiar su posicin para disminuir la PIC.  Medicamentos para prevenir convulsiones.  Ciruga para detener la hemorragia o extraer cogulos sanguneos (craneotoma).  Ciruga para extraer parte del crneo (craneotoma descompresiva). Esto permite que el cerebro tenga lugar para hincharse. INSTRUCCIONES PARA EL CUIDADO EN EL HOGAR Actividad  Descanse todo lo posible y evite actividades que sean fsicamente difciles o agotadoras.  Asegrese de dormir lo suficiente.  Limite las actividades que requieran pensar mucho o prestar atencin, como las siguientes: ? Psychologist, counselling televisin. ? Jugar juegos de memoria y armar rompecabezas. ? Tareas para el hogar o trabajos relacionados con el empleo. ? Trabajar en la computadora, participar en redes sociales y enviar mensajes de texto.  Evite actividades que puedan causar otra lesin en la cabeza, como practicar deportes, hasta que el mdico lo autorice. Puede ser peligroso tener otra lesin en la cabeza antes de que se haya recuperado de la primera.  Pregntele al mdico cundo puede retomar sus actividades habituales, incluidos el trabajo o [place]. Pdale al mdico un plan escalonado para que retome sus actividades de forma gradual.  Consulte a su mdico sobre cundo puede conducir automviles, Catering manager o usar maquinaria pesada. La capacidad para reaccionar puede ser ms lenta luego de una lesin cerebral. Nunca realice estas actividades si se siente mareado. Estilo de vida  No beba alcohol hasta que el mdico lo autorice y evite el consumo de sustancias. El alcohol y ciertas sustancias pueden demorar su recuperacin y ponerlo en riesgo de nuevas lesiones.  Si le resulta ms difcil que lo habitual recordar las cosas, escrbalas.  Trate de hacer una cosa por vez si se distrae con facilidad.  Consulte con  familiares y amigos si debe tomar decisiones importantes.  [place] sobre su lesin, los sntomas y las restricciones a sus amigos, a su familia, a un colega de confianza y a su gerente en el trabajo. Pdales que observen si aparecen nuevos problemas o empeoran los existentes. Instrucciones generales  [employer] de venta libre y los recetados solamente como se lo haya indicado el mdico.  Pdale a alguien que lo acompae durante 24horas despus de la lesin en la cabeza. Esta persona debe observar cambios en los sntomas y estar preparada para obtener ayuda mdica, si es necesario.  Concurra a todas las visitas de control como se lo haya indicado el mdico. Esto es importante. PREVENCIN  Trabaje para mejorar el equilibrio y la fuerza a fin de evitar cadas.  Use el cinturn de seguridad cuando se encuentre en un vehculo en movimiento.  Use un casco al andar en bicicleta, esquiar o practicar cualquier otro deporte o actividad que tenga riesgo de lesiones.  Beba alcohol solo con moderacin.  Tome medidas de seguridad en su hogar, por ejemplo: ? No tenga desorden  en pisos y escaleras. ? Coloque barras en los baos y [employer] en las escaleras. ? Ponga alfombras antideslizantes en pisos y baeras. ? Mejore la iluminacin en zonas de penumbra.  SOLICITE ATENCIN MDICA DE INMEDIATO SI:  Tiene los siguientes sntomas: ? Dolor de cabeza intenso que no se calma con los medicamentos. ? Dificultad para caminar, debilidad en los brazos o las piernas, o prdida del equilibrio. ? Secrecin transparente o con sangre que proviene de la nariz o de los odos. ? Cambios en la visin. ? Convulsiones.  Vomita.  Los sntomas empeoran.  Arrastra las palabras.  Se desmaya.  Est ms somnoliento o tiene dificultad para mantenerse despierto.  Las pupilas [place] de [place]. Estos sntomas pueden representar un problema grave que constituye Engineer, maintenance (IT). No espere hasta que los  sntomas desaparezcan. Solicite atencin mdica de inmediato. Comunquese con el servicio de emergencias de su localidad (911 en los [place]). No conduzca por sus propios medios [employer]. Esta informacin no tiene Marine scientist el consejo del mdico. Asegrese de hacerle al mdico cualquier pregunta que tenga. Document Released: 05/25/2005 Document Revised: 06/15/2014 Document Reviewed: 12/03/2015 Elsevier Interactive Patient Education  2017 [employer].      IF you received an x-ray today, you will receive an invoice from [facility]. Please contact [facility] at [phone] with questions or concerns regarding your invoice.   IF you received labwork today, you will receive an invoice from [facility]. Please contact [facility] at [phone] with questions or concerns regarding your invoice.   Our billing staff will not be able to assist you with questions regarding bills from these companies.  You will be contacted with the lab results as soon as they are available. The fastest way to get your results is to activate your My Chart account. Instructions are located on the last page of this paperwork. If you have not heard from [place] regarding the results in 2 weeks, please contact this office.

## 2017-02-02 NOTE — Progress Notes (Signed)
Subjective:  By signing my name below, I, Nicholas Potts, attest that this documentation has been prepared under the direction and in the presence of Nicholas Agreste, MD Electronically Signed: Ladene Potts, ED Scribe 02/02/2017 at 5:48 PM.   Patient ID: Nicholas Potts, male    DOB: 06/13/1960, 56 y.o.   MRN: [id number]  Chief Complaint  Patient presents with  . Dizziness    with nausea worse when laying down x 1 week. States he was hit with metal to his forhead 2 weeks ago.    HPI Nicholas Potts is a 56 y.o. 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 He was seen by cardiology in March. Last glucose reading was normal at 91 with A1C 5.1 in March. Previous head CT July 2014 without acute intracranial findings. Last echo in March 2017, ef 55-60%. Normal LV size, mild LV hypertrophy. No significant valve abnormalities.   Today, pt presents with gradually worsening dizziness which is exacerbated with laying down and nausea x 1 week. States he struck himself in the forehead with a piece of metal at work 2.5 weeks ago. Pain to his forehead persisted for 1 week but has since resolved. Pt reports symptoms of mild HA, heaviness on the L side of his head, sob x 5 days and darkening of his vision only when dizzy which returns to normal. Denies fever, LOC, syncope, vomiting, weakness, slurred speech, facial dropping, gait unsteadiness, chest pain, decreased hearing, cold-like symptoms.  Patient Active Problem List   Diagnosis Date Noted  . IFG (impaired fasting glucose) 08/21/2016  . Obesity (BMI 30-39.9) 06/23/2013  . Stress-induced cardiomyopathy -- essentially resolved   . CVA (cerebral infarction)   . OSA (obstructive sleep apnea)   . Hyperlipidemia   . Physical deconditioning 01/24/2013  . OSA on CPAP  01/19/2013  . Tinea pedis 01/18/2013  . Essential hypertension 01/13/2013  . Accidental opiate poisoning 01/11/2013  . ARF (acute renal failure) with tubular necrosis requiring HD 01/11/2013  . Anemia 01/11/2013  . Thrombocytopenia, unspecified ([place]) 01/11/2013  . 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 Accidental opiate poisoning 2014   post op   . ARF (acute respiratory failure) ([place]) 2014  . Blood dyscrasia   . Chronic kidney disease 2014   ARF   . Complication of anesthesia 2014   - post op - in combination of pain pills-  Cardiac and Respiratory Arrest- prior to sleep apnea diagonosis  . Constipation   . CVA (cerebral infarction)   . Hyperlipidemia   . Hypertension   . OSA (obstructive sleep apnea)   . Sleep apnea   . Stress-induced cardiomyopathy -- essentially resolved    Post-op Narcotic induced Acute Hypoxic respiratory failure with respiratory and PEA cardiac arrest following accidental narcotic overdose; initial [place] 20-20%, now improved to 55%   Was supported with Impella Cardiac Cath - non-obstructive CAD   . Thrombocytopenia ([place]) 2014   Past Surgical History:  Procedure Laterality Date  . ACHILLES TENDON REPAIR    . CARDIAC CATHETERIZATION  12/29/2012   RN LHC --> Impella. for severe shock with low EF. Mild to moderate RV pressure elevation. Normal coronary arteries. Cardiac Output 2.5, Index 1.46.  Marland Kitchen COLONOSCOPY WITH PROPOFOL N/A 10/30/2015   Procedure: COLONOSCOPY  WITH PROPOFOL;  Surgeon: Mauri Pole, MD;  Location: [facility] ENDOSCOPY;  Service: Endoscopy;  Laterality: N/A;  . INSERTION OF DIALYSIS CATHETER Right 01/09/2013   Procedure: INSERTION OF DIALYSIS CATHETER;  Surgeon: Angelia Mould, MD;  Location: [place];  Service: Vascular;  Laterality: Right;  . LEFT AND RIGHT HEART CATHETERIZATION WITH CORONARY ANGIOGRAM  12/29/2012   Procedure: LEFT AND RIGHT  HEART CATHETERIZATION WITH CORONARY ANGIOGRAM;  Surgeon: Sherren Mocha, MD;  Location: [facility] CATH LAB;  Service: Cardiovascular;;  . TRANSTHORACIC ECHOCARDIOGRAM  12/29/2012   EF 25% with periapical HK/AKA.  --> Followup echo 7/25 -- EF of 35%  . TRANSTHORACIC ECHOCARDIOGRAM  01/2013 ; 07/2015   a) EF 55-60%. No regional WMA. Grade 1 diastolic dysfunction. Mild LA dilatation.; b) Normal LV size with mild LV hypertrophy. EF 55-60%. Normal RV   Allergies  Allergen Reactions  . Oxycodone     Oxygen levels drop   Prior to Admission medications   Medication Sig Start Date End Date Taking? Authorizing Provider  aspirin 325 MG tablet Take 325 mg by mouth daily.    [provider]  atorvastatin (LIPITOR) 20 MG tablet Take 1 tablet (20 mg total) by mouth daily. 10/14/16 01/12/17  Leonie Man, MD  Multiple Vitamins-Minerals (ONE-A-DAY 50 PLUS) TABS Take 1 tablet by mouth daily.    [provider]  omega-3 acid ethyl esters (LOVAZA) 1 g capsule Take 1 g by mouth 2 (two) times daily.    [provider]   Social History   Social History  . Marital status: Married    Spouse name: N/A  . Number of children: N/A  . Years of education: N/A   Occupational History  . Not on file.   Social History Main Topics  . Smoking status: Former Smoker    Quit date: 06/20/1986  . Smokeless tobacco: Never Used  . Alcohol use Yes     Comment: History of heavy use but only social at present.  10/29/15 - "a beer every week or so"  . Drug use: No     Comment: Prior cocaine use but none since 2000  . Sexual activity: Not on file   Other Topics Concern  . Not on file   Social History Narrative   He is widowed for 13 years, and has 2 daughters: Nicholas Potts & [address] Nicholas Potts, and one son Nicholas Potts. the other history is daughter who is here with him today.    He works for [employer] as a [employer].   He quit smoking in 1988. He does drink  alcohol socially, but he does have a history of heavy use in the past. He also has a prior history of cocaine use but none since 2000.   Review of Systems  Constitutional: Negative for fever.  HENT: Negative for hearing loss.   Eyes: Positive for visual disturbance.  Respiratory: Positive for shortness of breath.   Cardiovascular: Negative for chest pain.  Gastrointestinal: Positive for nausea. Negative for vomiting.  Musculoskeletal: Negative for gait problem.  Neurological: Positive for dizziness and headaches. Negative for syncope, facial asymmetry, speech difficulty and weakness.      Objective:   Physical Exam  Constitutional: He is oriented to person, place, and time. He appears well-developed and well-nourished.  HENT:  Head: Normocephalic and atraumatic.  Right Ear: Tympanic membrane is not erythematous.  Left Ear: Tympanic membrane is erythematous.  L TM: small amount of fluid R  TM: some clear fluid Head: minimal tenderness on frontal scalp.  Eyes: Pupils are equal, round, and reactive to light. EOM are normal.  No nystagmus on standing. After bending forward he has 1 beat of nystagmus to the R. A few beats of horizontal nystagmus with lying supine with increased dizziness.  Neck: No JVD present. Carotid bruit is not present.  Cardiovascular: Normal rate, regular rhythm and normal heart sounds.   No murmur heard. Pulmonary/Chest: Effort normal and breath sounds normal. He has no rales.  Musculoskeletal: He exhibits no edema.  Neurological: He is alert and oriented to person, place, and time. He displays a negative Romberg sign.  No pronator drift. Normal heel to toe. Normal finger to nose. No facial droop. Equal strength in face, arms and legs.  Skin: Skin is warm and dry.  Psychiatric: He has a normal mood and affect.  Vitals reviewed.  Vitals:   02/02/17 1734  BP: 121/77  Pulse: 81  Resp: 16  Temp: 98.4 F (36.9 C)  TempSrc: Oral  SpO2: 96%  Weight: 249 lb  (112.9 kg)  Height: 5\' 6"  (1.676 m)   EKG sinus rhythm, no acute findings. QT 372, PR 190.   CXR: Dg Chest 2 View  Result Date: 02/02/2017 CLINICAL DATA:  Episodic dyspnea, nausea, vertigo EXAM: CHEST  2 VIEW COMPARISON:  01/11/2013 FINDINGS: Borderline enlargement of cardiac silhouette. Mediastinal contours and pulmonary vascularity normal. Bibasilar atelectasis. Lungs otherwise clear. No pleural effusion or pneumothorax. Scattered endplate spur formation thoracic spine. IMPRESSION: Bibasilar atelectasis. Electronically Signed   By: Lavonia Dana M.D.   On: 02/02/2017 18:22     Results for orders placed or performed in visit on 02/02/17  POCT CBC  Result Value Ref Range   WBC 9.7 4.6 - 10.2 K/uL   Lymph, poc 2.7 0.6 - 3.4   POC LYMPH PERCENT 27.8 10 - 50 %L   MID (cbc) 0.6 0 - 0.9   POC MID % 6.3 0 - 12 %M   POC Granulocyte 6.4 2 - 6.9   Granulocyte percent 65.9 37 - 80 %G   RBC 4.71 4.69 - 6.13 M/uL   Hemoglobin 14.8 14.1 - 18.1 g/dL   HCT, POC 44.2 43.5 - 53.7 %   MCV 93.9 80 - 97 fL   MCH, POC 31.5 (A) 27 - 31.2 pg   MCHC 33.6 31.8 - 35.4 g/dL   RDW, POC 13.2 %   Platelet Count, POC 305 142 - 424 K/uL   MPV 7.0 0 - 99.8 fL  POCT glucose (manual entry)  Result Value Ref Range   POC Glucose 97 70 - 99 mg/dl         Assessment & Plan:    ITHIEL LIEBLER is a 56 y.o. male Vertigo - Plan: meclizine (ANTIVERT) 25 MG tablet  Dizziness - Plan: EKG 12-Lead, POCT CBC, POCT glucose (manual entry), Basic metabolic panel, DG Chest 2 View, meclizine (ANTIVERT) 25 MG tablet  History of cardiomyopathy - Plan: DG Chest 2 View  Dyspnea, unspecified type - Plan: DG Chest 2 View  Ataxia - Plan: CT Head Wo Contrast  Post-traumatic headache, not intractable, unspecified chronicity pattern - Plan: CT Head Wo Contrast  Nausea without vomiting - Plan: DISCONTINUED: ondansetron (ZOFRAN ODT) 4 MG disintegrating tablet   Suspected peripheral vertigo, but history of cardiomyopathy  prior head injury and subsequent posttraumatic headache. Nonfocal neuro exam, bibasilar atelectasis only on chest x-ray, overall reassuring EKG.  -Check CT head given prior injury, posttraumatic  headaches, now with dizziness.  Trial of meclizine 25 mg 3 times a day when necessary, Zofran if needed for nausea/vomiting.  -Minimal fluid behind ears, possible reactive effusion, but no pain, no fever, no change in hearing. No further intervention at this point. Recheck 3 days  Meds ordered this encounter  Medications  . meclizine (ANTIVERT) 25 MG tablet    Sig: Take 1 tablet (25 mg total) by mouth 3 (three) times daily as needed for dizziness.    Dispense:  30 tablet    Refill:  0  . DISCONTD: ondansetron (ZOFRAN ODT) 4 MG disintegrating tablet    Sig: Take 1 tablet (4 mg total) by mouth every 8 (eight) hours as needed for nausea or vomiting.    Dispense:  10 tablet    Refill:  0   Patient Instructions   Currently your symptoms appear to be due to vertigo, but with the previous head injury and now worsening dizziness, will arrange for a CAT scan tomorrow. For now use meclizine 1 pill 3 times per day as needed for vertigo/dizziness, make sure you drink plenty of fluids and rest, out of work tomorrow.  If any worsening dizziness, or return of darkening of vision that does not immediately improve, proceed to the emergency room or call 911.   Lungs sound okay today, EKG looks okay for your heart rhythm. Recheck in the next 3 days, sooner or to emergency room if worse.  There may be a small amount of fluid behind your ears but does not necessarily look like an infection at this time. I would like to recheck your ears at follow up in 3 days - sooner if worse.   Return to the clinic or go to the nearest emergency room if any of your symptoms worsen or new symptoms occur.   Mareos (Dizziness) Los mareos son un problema muy frecuente. Se trata de una sensacin de inestabilidad o de desvanecimiento.  Puede sentir que se va a desmayar. Un mareo puede provocarle una lesin si se tropieza o se cae. Las Engineer, manufacturing de todas las edades pueden sufrir Tree surgeon, Armed forces training and education officer es ms frecuente en los adultos [place]. Esta afeccin puede tener muchas causas, entre las que se pueden [employer], la deshidratacin y [place]. INSTRUCCIONES PARA EL CUIDADO EN EL HOGAR Estas indicaciones pueden ayudarlo con el trastorno: Comida y bebida  Beba suficiente lquido para Theatre manager la orina clara o de color amarillo plido. Esto evita la deshidratacin. Trate de beber ms lquidos transparentes, como agua.  No beba alcohol.  Limite el consumo de cafena si el mdico se lo indica.  Limite el consumo de sal si el mdico se lo indica. Actividad  Evite los movimientos rpidos. ? Levntese de las sillas con lentitud y apyese hasta sentirse bien. ? Por la maana, sintese primero a un lado de la cama. Cuando se sienta bien, pngase lentamente de pie mientras se sostiene de algo, hasta que sepa que ha logrado el equilibrio.  Mueva las piernas con frecuencia si debe estar de pie en un lugar durante mucho tiempo. Mientras est de pie, contraiga y relaje los msculos de las piernas.  No conduzca vehculos ni opere maquinaria pesada si se siente mareado.  Evite agacharse si se siente mareado. En su casa, coloque los objetos de modo que le resulte fcil alcanzarlos sin Office manager. Estilo de vida  No consuma ningn producto que contenga tabaco, lo que incluye cigarrillos, tabaco de Higher education careers adviser o Psychologist, sport and exercise. Si necesita ayuda para dejar de  fumar, consulte al mdico.  Trate de reducir el nivel de estrs practicando actividades como el yoga o la meditacin. Hable con el mdico si necesita ayuda. Instrucciones generales  Controle sus mareos para ver si hay cambios.  Tome los medicamentos solamente como se lo haya indicado el mdico. Hable con el mdico si cree que los medicamentos que est tomando  son la causa de sus mareos.  Infrmele a un amigo o a un familiar si se siente mareado. Pdale a esta persona que llame al mdico si observa cambios en su comportamiento.  Concurra a todas las visitas de control como se lo haya indicado el mdico. Esto es importante. SOLICITE ATENCIN MDICA SI:  Los [employer].  Los [employer] o la sensacin de Engineer, petroleum.  Siente nuseas.  Ha perdido la audicin.  Aparecen nuevos sntomas.  Cuando est de pie se siente inestable o que la habitacin da vueltas.  SOLICITE ATENCIN MDICA DE INMEDIATO SI:  Vomita o tiene diarrea y no puede comer ni beber nada.  Tiene dificultad para hablar, para caminar, para tragar o para [employer], las [place] piernas.  Siente una debilidad generalizada.  No piensa con claridad o tiene dificultades para armar oraciones. Es posible que un amigo o un familiar adviertan que esto ocurre.  Tiene dolor de pecho, dolor abdominal, sudoracin o Risk manager.  Hay cambios en la visin.  Observa un sangrado.  Tiene dolores de [place].  Tiene dolor o rigidez en el cuello.  Tiene fiebre.  Esta informacin no tiene Marine scientist el consejo del mdico. Asegrese de hacerle al mdico cualquier pregunta que tenga. Document Released: 05/25/2005 Document Revised: 10/09/2014 Document Reviewed: 05/21/2014 Elsevier Interactive Patient Education  2017 [place] en la cabeza en los adultos (Head Injury, Adult) Hay muchos tipos de lesiones en la cabeza. Pueden ser tan leves como un chichn o pueden ser ms graves. Algunas de las lesiones graves en la cabeza son:  Robbi Garter que provoque un impacto en el cerebro (conmocin).  Hematoma en el cerebro (contusin). Esto significa que hay hemorragia en el cerebro que puede causar un edema.  Fisura en el crneo (fractura de crneo).  Hemorragia en el cerebro que se acumula, se coagula y forma un bulto (hematoma). Despus de una  lesin en la cabeza, es posible que deba estar en observacin durante un tiempo en el servicio de emergencias. Tambin puede ser necesario hospitalizarlo. Despus de que ocurre una lesin en la cabeza, la mayora de los problemas ocurre dentro de las primeras 24horas, pero los efectos secundarios pueden aparecer entre 7 y 10das despus de la lesin. Es importante controlar su afeccin para ver si hay cambios. CAUSAS Hay muchas causas posibles de una lesin en la cabeza. Una lesin grave en la cabeza puede ocurrirle a alguien que tuvo un accidente automovilstico (colisin en un vehculo de motor). Otras causas de lesiones importantes en la cabeza incluyen accidentes en bicicleta o motocicleta, lesiones deportivas y cadas. FACTORES DE RIESGO Es ms probable que esta afeccin ocurra en personas con las siguientes caractersticas:  Beben mucho alcohol o consumen sustancias.  Tienen ms de 63aos.  Estn en riesgos por cadas. SNTOMAS Hay muchos sntomas posibles de una lesin en la cabeza. Los sntomas visibles incluyen un hematoma, un chichn o Librarian, academic de la lesin. Otros sntomas no visibles incluyen:  Somnolencia o no poder [employer].  Desmayo.  Dolor de [place].  Convulsiones.  Mareos.  Confusin.  Problemas de memoria.  Nuseas o vmitos. Otros sntomas posibles que pueden aparecer despus de la lesin en la cabeza incluyen los siguientes:  Falta de atencin y Estate manager/land agent.  Fatiga o cansarse fcilmente.  Irritabilidad.  Sentir incomodidad cerca de luces brillantes o ruidos fuertes.  Ansiedad o depresin.  Trastornos del sueo. DIAGNSTICO Esta afeccin suele diagnosticarse en funcin de los sntomas, de una descripcin de la lesin y de un examen fsico. Tambin pueden hacerle estudios de diagnstico por imgenes, como una resonancia magntica (RM) o una tomografa computarizada (TC). Lo controlarn estrictamente. TRATAMIENTO El  tratamiento de esta afeccin depende de la gravedad y el tipo de lesin que sufri. El Roby principal del tratamiento es prevenir complicaciones y darle tiempo al cerebro para que se recupere. En el caso de una lesin leve en la cabeza, es posible que lo enven a casa, y el tratamiento puede incluir lo siguiente:  Observacin. Un adulto responsable debe quedarse con usted durante 24horas despus de producida la lesin y controlarlo con frecuencia.  Descanso fsico.  Descanso cerebral.  Analgsicos. En el caso de una lesin grave en la cabeza, el tratamiento puede incluir lo siguiente:  Observacin minuciosa. Esto incluye hospitalizacin con exmenes fsicos frecuentes. Puede necesitar ir a un hospital que se especialice en lesiones en la cabeza.  Analgsicos.  Asistencia respiratoria. Esto puede incluir un respirador.  Control de la presin dentro del cerebro (presin intracraneal o PIC). Esto puede incluir lo siguiente: ? Monitoreo de [employer]. ? Administrar medicamentos para disminuir la PIC. ? Cambiar su posicin para disminuir la PIC.  Medicamentos para prevenir convulsiones.  Ciruga para detener la hemorragia o extraer cogulos sanguneos (craneotoma).  Ciruga para extraer parte del crneo (craneotoma descompresiva). Esto permite que el cerebro tenga lugar para hincharse. INSTRUCCIONES PARA EL CUIDADO EN EL HOGAR Actividad  Descanse todo lo posible y evite actividades que sean fsicamente difciles o agotadoras.  Asegrese de dormir lo suficiente.  Limite las actividades que requieran pensar mucho o prestar atencin, como las siguientes: ? Psychologist, counselling televisin. ? Jugar juegos de memoria y armar rompecabezas. ? Tareas para el hogar o trabajos relacionados con el empleo. ? Trabajar en la computadora, participar en redes sociales y enviar mensajes de texto.  Evite actividades que puedan causar otra lesin en la cabeza, como practicar deportes, hasta que el mdico lo  autorice. Puede ser peligroso tener otra lesin en la cabeza antes de que se haya recuperado de la primera.  Pregntele al mdico cundo puede retomar sus actividades habituales, incluidos el trabajo o [place]. Pdale al mdico un plan escalonado para que retome sus actividades de forma gradual.  Consulte a su mdico sobre cundo puede conducir automviles, Catering manager o usar maquinaria pesada. La capacidad para reaccionar puede ser ms lenta luego de una lesin cerebral. Nunca realice estas actividades si se siente mareado. Estilo de vida  No beba alcohol hasta que el mdico lo autorice y evite el consumo de sustancias. El alcohol y ciertas sustancias pueden demorar su recuperacin y ponerlo en riesgo de nuevas lesiones.  Si le resulta ms difcil que lo habitual recordar las cosas, escrbalas.  Trate de hacer una cosa por vez si se distrae con facilidad.  Consulte con familiares y amigos si debe tomar decisiones importantes.  [place]unteles sobre su lesin, los sntomas y las restricciones a sus amigos, a su familia, a un colega de confianza y a su gerente en el trabajo. Pdales que observen si aparecen nuevos problemas o empeoran  los existentes. Instrucciones generales  [employer] de venta libre y los recetados solamente como se lo haya indicado el mdico.  Pdale a alguien que lo acompae durante 24horas despus de la lesin en la cabeza. Esta persona debe observar cambios en los sntomas y estar preparada para obtener ayuda mdica, si es necesario.  Concurra a todas las visitas de control como se lo haya indicado el mdico. Esto es importante. PREVENCIN  Trabaje para mejorar el equilibrio y la fuerza a fin de evitar cadas.  Use el cinturn de seguridad cuando se encuentre en un vehculo en movimiento.  Use un casco al andar en bicicleta, esquiar o practicar cualquier otro deporte o actividad que tenga riesgo de lesiones.  Beba alcohol solo con  moderacin.  Tome medidas de seguridad en su hogar, por ejemplo: ? No tenga desorden en pisos y escaleras. ? Coloque barras en los baos y [employer] en las escaleras. ? Ponga alfombras antideslizantes en pisos y baeras. ? Mejore la iluminacin en zonas de penumbra.  SOLICITE ATENCIN MDICA DE INMEDIATO SI:  Tiene los siguientes sntomas: ? Dolor de cabeza intenso que no se calma con los medicamentos. ? Dificultad para caminar, debilidad en los brazos o las piernas, o prdida del equilibrio. ? Secrecin transparente o con sangre que proviene de la nariz o de los odos. ? Cambios en la visin. ? Convulsiones.  Vomita.  Los sntomas empeoran.  Arrastra las palabras.  Se desmaya.  Est ms somnoliento o tiene dificultad para mantenerse despierto.  Las pupilas [place] de [place]. Estos sntomas pueden representar un problema grave que constituye Engineer, maintenance (IT). No espere hasta que los sntomas desaparezcan. Solicite atencin mdica de inmediato. Comunquese con el servicio de emergencias de su localidad (911 en los [place]). No conduzca por sus propios medios Principal Financial. Esta informacin no tiene Marine scientist el consejo del mdico. Asegrese de hacerle al mdico cualquier pregunta que tenga. Document Released: 05/25/2005 Document Revised: 06/15/2014 Document Reviewed: 12/03/2015 Elsevier Interactive Patient Education  2017 [employer].      IF you received an x-ray today, you will receive an invoice from [facility]. Please contact [facility] at [phone] with questions or concerns regarding your invoice.   IF you received labwork today, you will receive an invoice from [facility]. Please contact [facility] at [phone] with questions or concerns regarding your invoice.   Our billing staff will not be able to assist you with questions regarding bills from these companies.  You will be contacted with the lab results as soon as they are  available. The fastest way to get your results is to activate your My Chart account. Instructions are located on the last page of this paperwork. If you have not heard from [place] regarding the results in 2 weeks, please contact this office.       I personally performed the services described in this documentation, which was scribed in my presence. The recorded information has been reviewed and considered for accuracy and completeness, addended by me as needed, and agree with information above.  Signed,   Merri Ray, MD [facility] [place].  02/08/17 10:33 AM

## 2017-02-03 ENCOUNTER — Ambulatory Visit
Admission: RE | Admit: 2017-02-03 | Discharge: 2017-02-03 | Disposition: A | Discharge status: Home / Self Care | Payer: BLUE CROSS/BLUE SHIELD | Source: Ambulatory Visit | Attending: Family Medicine | Admitting: Family Medicine

## 2017-02-03 DIAGNOSIS — G44309 Post-traumatic headache, unspecified, not intractable: Secondary | ICD-10-CM

## 2017-02-03 DIAGNOSIS — R270 Ataxia, unspecified: Secondary | ICD-10-CM

## 2017-02-03 LAB — BASIC METABOLIC PANEL
BUN/Creatinine Ratio: 19 (ref 9–20)
BUN: 22 mg/dL (ref 6–24)
CHLORIDE: 105 mmol/L (ref 96–106)
CO2: 21 mmol/L (ref 20–29)
Calcium: 9.1 mg/dL (ref 8.7–10.2)
Creatinine, Ser: 1.14 mg/dL (ref 0.76–1.27)
GFR calc Af Amer: 82 mL/min/{1.73_m2} (ref >59)
GFR, EST NON AFRICAN AMERICAN: 71 mL/min/{1.73_m2} (ref >59)
Glucose: 96 mg/dL (ref 65–99)
POTASSIUM: 4.4 mmol/L (ref 3.5–5.2)
SODIUM: 143 mmol/L (ref 134–144)

## 2017-02-05 ENCOUNTER — Encounter: Payer: Self-pay | Admitting: Family Medicine

## 2017-02-05 ENCOUNTER — Ambulatory Visit (INDEPENDENT_AMBULATORY_CARE_PROVIDER_SITE_OTHER): Payer: BLUE CROSS/BLUE SHIELD | Admitting: Family Medicine

## 2017-02-05 VITALS — BP 112/73 | HR 85 | Temp 99.1°F | Resp 17 | Ht 66.0 in | Wt 248.0 lb

## 2017-02-05 DIAGNOSIS — R5383 Other fatigue: Secondary | ICD-10-CM

## 2017-02-05 DIAGNOSIS — R930 Abnormal findings on diagnostic imaging of skull and head, not elsewhere classified: Secondary | ICD-10-CM | POA: Diagnosis not present

## 2017-02-05 DIAGNOSIS — R300 Dysuria: Secondary | ICD-10-CM | POA: Diagnosis not present

## 2017-02-05 DIAGNOSIS — R42 Dizziness and giddiness: Secondary | ICD-10-CM | POA: Diagnosis not present

## 2017-02-05 LAB — POCT URINALYSIS DIP (MANUAL ENTRY)
BILIRUBIN UA: NEGATIVE mg/dL
Bilirubin, UA: NEGATIVE
Blood, UA: NEGATIVE
Glucose, UA: NEGATIVE mg/dL
LEUKOCYTES UA: NEGATIVE
Nitrite, UA: NEGATIVE
SPEC GRAV UA: 1.025 (ref 1.010–1.025)
Urobilinogen, UA: 0.2 E.U./dL
pH, UA: 6 (ref 5.0–8.0)

## 2017-02-05 LAB — POC MICROSCOPIC URINALYSIS (UMFC): MUCUS RE: ABSENT

## 2017-02-05 NOTE — Progress Notes (Signed)
Subjective:  By signing my name below, I, [name], attest that this documentation has been prepared under the direction and in the presence of [name], MD Electronically Signed: Ladene Artist, ED Scribe 02/05/2017 at 12:02 PM.   Patient ID: Nicholas Potts, male    DOB: 1960-07-07, 56 y.o.   MRN: [id number]  Chief Complaint  Patient presents with  . Dizziness   HPI Nicholas Potts is a 56 y.o. male who presents to Primary Care at [facility] for recheck dizzines. Thought to have component of vertigo. Treated with Meclizine and Zofran but with previous head injury he was sent for CT head. There were some abnormalities on CT head, may have been due to previous altered mental status in 2014. CBC, glucose and BMP overall reassuring. He did have a small amount of effusion behind the L ear without apparent infection at visit 3 days ago.  Today, pt states that dizziness has improved with taking Meclizine. He has experienced some fatigue overall and has a h/o sleep apnea which he treats with cpap. Pt has not needed Zofran for nausea. Denies dizziness at this time, ear pain, chest pain, chest tightness, heart palpitations, weakness in extremities.  Dg Chest 2 View  Result Date: 02/02/2017 CLINICAL DATA:  Episodic dyspnea, nausea, vertigo EXAM: CHEST  2 VIEW COMPARISON:  01/11/2013 FINDINGS: Borderline enlargement of cardiac silhouette. Mediastinal contours and pulmonary vascularity normal. Bibasilar atelectasis. Lungs otherwise clear. No pleural effusion or pneumothorax. Scattered endplate spur formation thoracic spine. IMPRESSION: Bibasilar atelectasis. Electronically Signed   By: Lavonia Dana M.D.   On: 02/02/2017 18:22   Ct Head Wo Contrast  Result Date: 02/03/2017 CLINICAL DATA:  Initial evaluation for acute posttraumatic headache, injury 2 and half weeks ago, worsening vertigo, ataxia. EXAM: CT HEAD WITHOUT CONTRAST TECHNIQUE: Contiguous axial images were obtained from the base  of the skull through the vertex without intravenous contrast. COMPARISON:  Prior CT from 12/29/2012. FINDINGS: Brain: 3 cerebral volume within normal limits. No evidence for acute intracranial hemorrhage. No acute large vessel territory infarct. No mass lesion, midline shift or mass effect. No hydrocephalus. No extra-axial fluid collection. Bilateral symmetric cystic lucencies involving the globus palladi seen measuring approximate 5 mm each (series 2, image 16). These are chronic in appearance, but new relative to prior CT from 2014. Vascular: No hyperdense vessel. Skull: Scalp soft tissues within normal limits.  Calvarium intact. Sinuses/Orbits: Globes and orbital soft tissues within normal limits. Paranasal sinuses are clear. No mastoid effusion. IMPRESSION: 1. No acute intracranial process. 2. Symmetric subcentimeter lucencies involving the globus palladi bilaterally, chronic in appearance, but new relative to most recent CT from 2014. Primary differential considerations include prior hypoxic ischemic injury or previous toxic metabolic derangement. Drugs of abuse can also result in this appearance. 3. Otherwise normal head CT. Electronically Signed   By: Jeannine Boga M.D.   On: 02/03/2017 18:03   GU At the conclusion of the visit, he asked about urinary symptoms as well including intermittent dysuria x 3 months. Has also noticed urinary hesitancy. No h/o prostate infection or CA.   Hand Swelling Pt also mentioned right hand swelling x 5-6 months at the conclusion of the visit.   Patient Active Problem List   Diagnosis Date Noted  . IFG (impaired fasting glucose) 08/21/2016  . Obesity (BMI 30-39.9) 06/23/2013  . Stress-induced cardiomyopathy -- essentially resolved   . CVA (cerebral infarction)   . OSA (obstructive sleep apnea)   . Hyperlipidemia   .  Physical deconditioning 01/24/2013  . OSA on CPAP 01/19/2013  . Tinea pedis 01/18/2013  . Essential hypertension 01/13/2013  . Accidental  opiate poisoning 01/11/2013  . ARF (acute renal failure) with tubular necrosis requiring HD 01/11/2013  . Anemia 01/11/2013  . Thrombocytopenia, unspecified ([place]) 01/11/2013  . 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 Accidental opiate poisoning 2014   post op   . ARF (acute respiratory failure) ([place]) 2014  . Blood dyscrasia   . Chronic kidney disease 2014   ARF   . Complication of anesthesia 2014   - post op - in combination of pain pills-  Cardiac and Respiratory Arrest- prior to sleep apnea diagonosis  . Constipation   . CVA (cerebral infarction)   . Hyperlipidemia   . Hypertension   . OSA (obstructive sleep apnea)   . Sleep apnea   . Stress-induced cardiomyopathy -- essentially resolved    Post-op Narcotic induced Acute Hypoxic respiratory failure with respiratory and PEA cardiac arrest following accidental narcotic overdose; initial [place] 20-20%, now improved to 55%   Was supported with Impella Cardiac Cath - non-obstructive CAD   . Thrombocytopenia ([place]) 2014   Past Surgical History:  Procedure Laterality Date  . ACHILLES TENDON REPAIR    . CARDIAC CATHETERIZATION  12/29/2012   RN LHC --> [facility]. for severe shock with low EF. Mild to moderate RV pressure elevation. Normal coronary arteries. Cardiac Output 2.5, Index 1.46.  Marland Kitchen COLONOSCOPY WITH PROPOFOL N/A 10/30/2015   Procedure: COLONOSCOPY WITH PROPOFOL;  Surgeon: Mauri Pole, MD;  Location: [facility];  Service: Endoscopy;  Laterality: N/A;  . INSERTION OF DIALYSIS CATHETER Right 01/09/2013   Procedure: INSERTION OF DIALYSIS CATHETER;  Surgeon: Angelia Mould, MD;  Location: [facility];  Service: Vascular;  Laterality: Right;  . LEFT AND RIGHT HEART CATHETERIZATION WITH CORONARY ANGIOGRAM  12/29/2012   Procedure: LEFT AND RIGHT HEART CATHETERIZATION WITH CORONARY ANGIOGRAM;  Surgeon: Sherren Mocha, MD;  Location: [facility]  CATH LAB;  Service: Cardiovascular;;  . TRANSTHORACIC ECHOCARDIOGRAM  12/29/2012   EF 25% with periapical HK/AKA.  --> Followup echo 7/25 -- EF of 35%  . TRANSTHORACIC ECHOCARDIOGRAM  01/2013 ; 07/2015   a) EF 55-60%. No regional WMA. Grade 1 diastolic dysfunction. Mild LA dilatation.; b) Normal LV size with mild LV hypertrophy. EF 55-60%. Normal RV   Allergies  Allergen Reactions  . Oxycodone     Oxygen levels drop   Prior to Admission medications   Medication Sig Start Date End Date Taking? Authorizing Provider  aspirin 325 MG tablet Take 325 mg by mouth daily.    [provider]  atorvastatin (LIPITOR) 20 MG tablet Take 1 tablet (20 mg total) by mouth daily. 10/14/16 02/02/17  Leonie Man, MD  meclizine (ANTIVERT) 25 MG tablet Take 1 tablet (25 mg total) by mouth 3 (three) times daily as needed for dizziness. 02/02/17   [name], MD  Multiple Vitamins-Minerals (ONE-A-DAY 50 PLUS) TABS Take 1 tablet by mouth daily.    [provider]  omega-3 acid ethyl esters (LOVAZA) 1 g capsule Take 1 g by mouth 2 (two) times daily.    [provider]  ondansetron (ZOFRAN ODT) 4 MG disintegrating tablet Take 1 tablet (4 mg total) by mouth every 8 (eight) hours as needed for nausea or vomiting. 02/02/17   [name], MD   Social History   Social  History  . Marital status: Married    Spouse name: N/A  . Number of children: N/A  . Years of education: N/A   Occupational History  . Not on file.   Social History Main Topics  . Smoking status: Former Smoker    Quit date: 06/20/1986  . Smokeless tobacco: Never Used  . Alcohol use Yes     Comment: History of heavy use but only social at present.  10/29/15 - "a beer every week or so"  . Drug use: No     Comment: Prior cocaine use but none since 2000  . Sexual activity: No   Other Topics Concern  . Not on file   Social History Narrative   He is widowed for 13 years, and has 2 daughters: [name] & [address] [name], and one son Ridgely Anastacio. the other history is daughter who is here with him today.    He works for [employer] as a [employer].   He quit smoking in 1988. He does drink alcohol socially, but he does have a history of heavy use in the past. He also has a prior history of cocaine use but none since 2000.   Review of Systems  Constitutional: Positive for fatigue.  HENT: Negative for ear pain.   Respiratory: Negative for chest tightness.   Cardiovascular: Negative for chest pain and palpitations.  Gastrointestinal: Negative for nausea.  Genitourinary: Positive for dysuria.  Musculoskeletal: Positive for joint swelling.  Neurological: Negative for dizziness and weakness.      Objective:   Physical Exam  Constitutional: He is oriented to person, place, and time. He appears well-developed and well-nourished. No distress.  HENT:  Head: Normocephalic and atraumatic.  Right Ear: Tympanic membrane is not erythematous.  Left Ear: Tympanic membrane is not erythematous.  L TM: Possible slight clear effusion at base of TM which is pearly grey R TM: Minimal clear fluid with scarring  Eyes: Pupils are equal, round, and reactive to light. Conjunctivae and EOM are normal. Right eye exhibits no nystagmus. Left eye exhibits no nystagmus.  Neck: Neck supple. No tracheal deviation present.  Cardiovascular: Normal rate and regular rhythm.   Pulmonary/Chest: Effort normal and breath sounds normal. No respiratory distress.  Musculoskeletal: Normal range of motion.  Neurological: He is alert and oriented to person, place, and time.  Skin: Skin is warm and dry.  Psychiatric: He has a normal mood and affect. His behavior is normal.  Nursing note and vitals reviewed.  Vitals:   02/05/17 1133  BP: 112/73  Pulse: 85  Resp: 17  Temp: 99.1 F (37.3 C)  TempSrc: Oral  SpO2: 98%  Weight: 248 lb (112.5 kg)  Height: 5\' 6"  (1.676 m)   Results for orders  placed or performed in visit on 02/05/17  POCT urinalysis dipstick  Result Value Ref Range   Color, UA yellow yellow   Clarity, UA clear clear   Glucose, UA negative negative mg/dL   Bilirubin, UA negative negative   Ketones, POC UA negative negative mg/dL   Spec Grav, UA 1.025 1.010 - 1.025   Blood, UA negative negative   pH, UA 6.0 5.0 - 8.0   Protein Ur, POC trace (A) negative mg/dL   Urobilinogen, UA 0.2 0.2 or 1.0 E.U./dL   Nitrite, UA Negative Negative   Leukocytes, UA Negative Negative  POCT Microscopic Urinalysis ([facility])  Result Value Ref Range   WBC,UR,HPF,POC None None WBC/hpf   RBC,UR,HPF,POC  None None RBC/hpf   Bacteria Few (A) None, Too numerous to count   Mucus Absent Absent   Epithelial Cells, UR Per Microscopy None None, Too numerous to count cells/hpf       Assessment & Plan:   Nicholas Potts is a 56 y.o. male Abnormal head CT - Plan: Ambulatory referral to Neurology Dizziness - Plan: Ambulatory referral to Neurology  - Suspected vertigo, improving. taper off meclizine as symptoms improve, RTC precautions if worsening.  - Will refer to neurology to discuss head CT results further, but may be sequela from his hospitalization in 2014.  Other fatigue  - Recent testing reassuring. May be related to vertigo, advised to return next week to discuss further and if not improving at that time, consider other testing.   Dysuria - Plan: POCT urinalysis dipstick, PSA, POCT Microscopic Urinalysis ([facility]), Urine Culture  -Noted at end of visit. Reassuring urinalysis, check urine culture, PSA. RTC precautions if worsening. Recheck next week.  No orders of the defined types were placed in this encounter.  Patient Instructions    I'm glad to hear that the dizziness has improved. Okay to continue meclizine for the next day or 2, then can try off of that medication. If you need to take it for another few days that is fine, but if your symptoms persist more than the next 1  week, or any worsening of symptoms, please return here or the emergency room.  I will refer you to neurology to review the CAT scan, but that is most likely from the issue in 2014.  Fatigue can be related to many causes. Your blood work looked okay last visit. Please return next week to discuss fatigue and urinary symptoms further, as well as hand pain/swelling possibly at that visit as well. If fatigue persists you can also discuss with your sleep specialist as repeat sleep testing or evaluation of your CPAP settings may be needed.   Dizziness Dizziness is a common problem. It is a feeling of unsteadiness or light-headedness. You may feel like you are about to faint. Dizziness can lead to injury if you stumble or fall. Anyone can become dizzy, but dizziness is more common in older adults. This condition can be caused by a number of things, including medicines, dehydration, or illness. Follow these instructions at home: Taking these steps may help with your condition: Eating and drinking  Drink enough fluid to keep your urine clear or pale yellow. This helps to keep you from becoming dehydrated. Try to drink more clear fluids, such as water.  Do not drink alcohol.  Limit your caffeine intake if directed by your health care provider.  Limit your salt intake if directed by your health care provider. Activity  Avoid making quick movements. ? Rise slowly from chairs and steady yourself until you feel okay. ? In the morning, first sit up on the side of the bed. When you feel okay, stand slowly while you hold onto something until you know that your balance is fine.  Move your legs often if you need to stand in one place for a long time. Tighten and relax your muscles in your legs while you are standing.  Do not drive or operate heavy machinery if you feel dizzy.  Avoid bending down if you feel dizzy. Place items in your home so that they are easy for you to reach without leaning  over. Lifestyle  Do not use any tobacco products, including cigarettes, chewing tobacco, or electronic cigarettes.  If you need help quitting, ask your health care provider.  Try to reduce your stress level, such as with yoga or meditation. Talk with your health care provider if you need help. General instructions  Watch your dizziness for any changes.  Take medicines only as directed by your health care provider. Talk with your health care provider if you think that your dizziness is caused by a medicine that you are taking.  Tell a friend or a family member that you are feeling dizzy. If he or she notices any changes in your behavior, have this person call your health care provider.  Keep all follow-up visits as directed by your health care provider. This is important. Contact a health care provider if:  Your dizziness does not go away.  Your dizziness or light-headedness gets worse.  You feel nauseous.  You have reduced hearing.  You have new symptoms.  You are unsteady on your feet or you feel like the room is spinning. Get help right away if:  You vomit or have diarrhea and are unable to eat or drink anything.  You have problems talking, walking, swallowing, or using your arms, hands, or legs.  You feel generally weak.  You are not thinking clearly or you have trouble forming sentences. It may take a friend or family member to notice this.  You have chest pain, abdominal pain, shortness of breath, or sweating.  Your vision changes.  You notice any bleeding.  You have a headache.  You have neck pain or a stiff neck.  You have a fever. This information is not intended to replace advice given to you by your health care provider. Make sure you discuss any questions you have with your health care provider. Document Released: 11/18/2000 Document Revised: 10/31/2015 Document Reviewed: 05/21/2014 Elsevier Interactive Patient Education  2017 Elsevier  Inc.   Fatigue Fatigue is feeling tired all of the time, a lack of energy, or a lack of motivation. Occasional or mild fatigue is often a normal response to activity or life in general. However, long-lasting (chronic) or extreme fatigue may indicate an underlying medical condition. Follow these instructions at home: Watch your fatigue for any changes. The following actions may help to lessen any discomfort you are feeling:  Talk to your health care provider about how much sleep you need each night. Try to get the required amount every night.  Take medicines only as directed by your health care provider.  Eat a healthy and nutritious diet. Ask your health care provider if you need help changing your diet.  Drink enough fluid to keep your urine clear or pale yellow.  Practice ways of relaxing, such as yoga, meditation, massage therapy, or acupuncture.  Exercise regularly.  Change situations that cause you stress. Try to keep your work and personal routine reasonable.  Do not abuse illegal drugs.  Limit alcohol intake to no more than 1 drink per day for nonpregnant women and 2 drinks per day for men. One drink equals 12 ounces of beer, 5 ounces of wine, or 1 ounces of hard liquor.  Take a multivitamin, if directed by your health care provider.  Contact a health care provider if:  Your fatigue does not get better.  You have a fever.  You have unintentional weight loss or gain.  You have headaches.  You have difficulty: ? Falling asleep. ? Sleeping throughout the night.  You feel angry, guilty, anxious, or sad.  You are unable to have a bowel movement (  constipation).  You skin is dry.  Your legs or another part of your body is swollen. Get help right away if:  You feel confused.  Your vision is blurry.  You feel faint or pass out.  You have a severe headache.  You have severe abdominal, pelvic, or back pain.  You have chest pain, shortness of breath, or an  irregular or fast heartbeat.  You are unable to urinate or you urinate less than normal.  You develop abnormal bleeding, such as bleeding from the rectum, vagina, nose, lungs, or nipples.  You vomit blood.  You have thoughts about harming yourself or committing suicide.  You are worried that you might harm someone else. This information is not intended to replace advice given to you by your health care provider. Make sure you discuss any questions you have with your health care provider. Document Released: 03/22/2007 Document Revised: 10/31/2015 Document Reviewed: 09/26/2013 Elsevier Interactive Patient Education  2018 [employer].    IF you received an x-ray today, you will receive an invoice from [facility]. Please contact [facility] at [phone] with questions or concerns regarding your invoice.   IF you received labwork today, you will receive an invoice from [facility]. Please contact [facility] at [phone] with questions or concerns regarding your invoice.   Our billing staff will not be able to assist you with questions regarding bills from these companies.  You will be contacted with the lab results as soon as they are available. The fastest way to get your results is to activate your My Chart account. Instructions are located on the last page of this paperwork. If you have not heard from [place] regarding the results in 2 weeks, please contact this office.       I personally performed the services described in this documentation, which was scribed in my presence. The recorded information has been reviewed and considered for accuracy and completeness, addended by me as needed, and agree with information above.  Signed,   Merri Ray, MD [facility] at [facility].  02/06/17 3:36 PM

## 2017-02-05 NOTE — Patient Instructions (Addendum)
I'm glad to hear that the dizziness has improved. Okay to continue meclizine for the next day or 2, then can try off of that medication. If you need to take it for another few days that is fine, but if your symptoms persist more than the next 1 week, or any worsening of symptoms, please return here or the emergency room.  I will refer you to neurology to review the CAT scan, but that is most likely from the issue in 2014.  Fatigue can be related to many causes. Your blood work looked okay last visit. Please return next week to discuss fatigue and urinary symptoms further, as well as hand pain/swelling possibly at that visit as well. If fatigue persists you can also discuss with your sleep specialist as repeat sleep testing or evaluation of your CPAP settings may be needed.   Dizziness Dizziness is a common problem. It is a feeling of unsteadiness or light-headedness. You may feel like you are about to faint. Dizziness can lead to injury if you stumble or fall. Anyone can become dizzy, but dizziness is more common in older adults. This condition can be caused by a number of things, including medicines, dehydration, or illness. Follow these instructions at home: Taking these steps may help with your condition: Eating and drinking  Drink enough fluid to keep your urine clear or pale yellow. This helps to keep you from becoming dehydrated. Try to drink more clear fluids, such as water.  Do not drink alcohol.  Limit your caffeine intake if directed by your health care provider.  Limit your salt intake if directed by your health care provider. Activity  Avoid making quick movements. ? Rise slowly from chairs and steady yourself until you feel okay. ? In the morning, first sit up on the side of the bed. When you feel okay, stand slowly while you hold onto something until you know that your balance is fine.  Move your legs often if you need to stand in one place for a long time. Tighten and relax  your muscles in your legs while you are standing.  Do not drive or operate heavy machinery if you feel dizzy.  Avoid bending down if you feel dizzy. Place items in your home so that they are easy for you to reach without leaning over. Lifestyle  Do not use any tobacco products, including cigarettes, chewing tobacco, or electronic cigarettes. If you need help quitting, ask your health care provider.  Try to reduce your stress level, such as with yoga or meditation. Talk with your health care provider if you need help. General instructions  Watch your dizziness for any changes.  Take medicines only as directed by your health care provider. Talk with your health care provider if you think that your dizziness is caused by a medicine that you are taking.  Tell a friend or a family member that you are feeling dizzy. If he or she notices any changes in your behavior, have this person call your health care provider.  Keep all follow-up visits as directed by your health care provider. This is important. Contact a health care provider if:  Your dizziness does not go away.  Your dizziness or light-headedness gets worse.  You feel nauseous.  You have reduced hearing.  You have new symptoms.  You are unsteady on your feet or you feel like the room is spinning. Get help right away if:  You vomit or have diarrhea and are unable to eat or drink  anything.  You have problems talking, walking, swallowing, or using your arms, hands, or legs.  You feel generally weak.  You are not thinking clearly or you have trouble forming sentences. It may take a friend or family member to notice this.  You have chest pain, abdominal pain, shortness of breath, or sweating.  Your vision changes.  You notice any bleeding.  You have a headache.  You have neck pain or a stiff neck.  You have a fever. This information is not intended to replace advice given to you by your health care provider. Make sure  you discuss any questions you have with your health care provider. Document Released: 11/18/2000 Document Revised: 10/31/2015 Document Reviewed: 05/21/2014 Elsevier Interactive Patient Education  2017 [employer]   Fatigue Fatigue is feeling tired all of the time, a lack of energy, or a lack of motivation. Occasional or mild fatigue is often a normal response to activity or life in general. However, long-lasting (chronic) or extreme fatigue may indicate an underlying medical condition. Follow these instructions at home: Watch your fatigue for any changes. The following actions may help to lessen any discomfort you are feeling:  Talk to your health care provider about how much sleep you need each night. Try to get the required amount every night.  Take medicines only as directed by your health care provider.  Eat a healthy and nutritious diet. Ask your health care provider if you need help changing your diet.  Drink enough fluid to keep your urine clear or pale yellow.  Practice ways of relaxing, such as yoga, meditation, massage therapy, or acupuncture.  Exercise regularly.  Change situations that cause you stress. Try to keep your work and personal routine reasonable.  Do not abuse illegal drugs.  Limit alcohol intake to no more than 1 drink per day for nonpregnant women and 2 drinks per day for men. One drink equals 12 ounces of beer, 5 ounces of wine, or 1 ounces of hard liquor.  Take a multivitamin, if directed by your health care provider.  Contact a health care provider if:  Your fatigue does not get better.  You have a fever.  You have unintentional weight loss or gain.  You have headaches.  You have difficulty: ? Falling asleep. ? Sleeping throughout the night.  You feel angry, guilty, anxious, or sad.  You are unable to have a bowel movement (constipation).  You skin is dry.  Your legs or another part of your body is swollen. Get help right away  if:  You feel confused.  Your vision is blurry.  You feel faint or pass out.  You have a severe headache.  You have severe abdominal, pelvic, or back pain.  You have chest pain, shortness of breath, or an irregular or fast heartbeat.  You are unable to urinate or you urinate less than normal.  You develop abnormal bleeding, such as bleeding from the rectum, vagina, nose, lungs, or nipples.  You vomit blood.  You have thoughts about harming yourself or committing suicide.  You are worried that you might harm someone else. This information is not intended to replace advice given to you by your health care provider. Make sure you discuss any questions you have with your health care provider. Document Released: 03/22/2007 Document Revised: 10/31/2015 Document Reviewed: 09/26/2013 Elsevier Interactive Patient Education  2018 [employer].    IF you received an x-ray today, you will receive an invoice from [facility]. Please contact [facility]  at [phone] with questions or concerns regarding your invoice.   IF you received labwork today, you will receive an invoice from [facility]. Please contact [facility] at [phone] with questions or concerns regarding your invoice.   Our billing staff will not be able to assist you with questions regarding bills from these companies.  You will be contacted with the lab results as soon as they are available. The fastest way to get your results is to activate your My Chart account. Instructions are located on the last page of this paperwork. If you have not heard from [place] regarding the results in 2 weeks, please contact this office.

## 2017-02-06 LAB — URINE CULTURE

## 2017-02-06 LAB — PSA: PROSTATE SPECIFIC AG, SERUM: 0.7 ng/mL (ref 0.0–4.0)

## 2017-02-09 ENCOUNTER — Ambulatory Visit (INDEPENDENT_AMBULATORY_CARE_PROVIDER_SITE_OTHER): Payer: BLUE CROSS/BLUE SHIELD

## 2017-02-09 ENCOUNTER — Encounter: Payer: Self-pay | Admitting: Family Medicine

## 2017-02-09 ENCOUNTER — Ambulatory Visit (INDEPENDENT_AMBULATORY_CARE_PROVIDER_SITE_OTHER): Payer: BLUE CROSS/BLUE SHIELD | Admitting: Family Medicine

## 2017-02-09 VITALS — BP 138/82 | HR 98 | Temp 98.7°F | Resp 18 | Ht 66.0 in | Wt 253.0 lb

## 2017-02-09 DIAGNOSIS — R42 Dizziness and giddiness: Secondary | ICD-10-CM

## 2017-02-09 DIAGNOSIS — N342 Other urethritis: Secondary | ICD-10-CM

## 2017-02-09 DIAGNOSIS — M7989 Other specified soft tissue disorders: Secondary | ICD-10-CM | POA: Diagnosis not present

## 2017-02-09 DIAGNOSIS — R938 Abnormal findings on diagnostic imaging of other specified body structures: Secondary | ICD-10-CM | POA: Diagnosis not present

## 2017-02-09 DIAGNOSIS — R9389 Abnormal findings on diagnostic imaging of other specified body structures: Secondary | ICD-10-CM

## 2017-02-09 DIAGNOSIS — Z789 Other specified health status: Secondary | ICD-10-CM | POA: Diagnosis not present

## 2017-02-09 MED ORDER — DOXYCYCLINE HYCLATE 100 MG PO TABS: 100.0000 mg | ORAL_TABLET | Freq: Two times a day (BID) | ORAL | 0 refills | Status: DC

## 2017-02-09 NOTE — Patient Instructions (Addendum)
voy a referirle a specialista de mano. Hay una area abnormal poquito en hueso.   Tome antibiotico doxycycline por orina. despues esta medicina, si simptomas regrese - llamame.  Es posible referir a Therapist, art de [place].   regrese mas temprano empeorse.     Uretritis, adultos (Urethritis, Adult) La uretritis es la inflamacin del conducto a travs del cual la orina sale de la vejiga (uretra). CAUSAS La causa de la uretritis suele ser una infeccin en la uretra. La infeccin puede ser viral, como herpes. Nicholas Potts puede ser Nicholas Potts, Nicholas Potts. FACTORES DE RIESGO Los factores de riesgo de la uretritis incluyen:  Best boy sexo sin usar condn.  Tener mltiples parejas sexuales.  Tener una higiene deficiente. [place] sntomas de la uretritis son menos perceptibles en las mujeres que en los hombres. Estos sntomas incluyen:  Sensacin de ardor al Garment/textile technologist (disuria).  Secrecin por Geologist, engineering.  Sangre en la orina (hematuria).  Orinar ms que lo habitual. DIAGNSTICO Para confirmar un diagnstico de uretritis, su mdico har lo siguiente:  Preguntar sobre su historial sexual.  Optometrist un examen fsico.  Pedir una muestra de orina para Chiropractor.  Utilizar un hisopo de algodn para Nurse, children's de la uretra para pruebas en el laboratorio. TRATAMIENTO Es importante tratar la uretritis. Segn la causa, la uretritis no tratada puede producir infecciones genitales graves y posiblemente, la infertilidad. La uretritis causada por una infeccin bacteriana se trata con antibiticos. Todas las parejas sexuales deben tratarse. INSTRUCCIONES PARA EL CUIDADO EN EL HOGAR  No tenga relaciones sexuales hasta que se conozcan los resultados de las pruebas y se complete el tratamiento, aunque los sntomas desaparezcan antes de Occupational hygienist.  Si le recetaron antibiticos, asegrese de terminarlos aunque comience a sentirse  mejor.  SOLICITE ATENCIN MDICA SI:  Sus sntomas no mejoran en [employer]  Los sntomas empeoran.  Siente dolor abdominal o plvico (en las mujeres).  Siente dolor en las articulaciones.  Tiene fiebre.  SOLICITE ATENCIN MDICA DE INMEDIATO SI:  Siente un dolor intenso en el abdomen, la espalda o un lado del cuerpo.  Ha vomitado repetidas veces.  ASEGRESE DE QUE:  Comprende estas instrucciones.  Controlar su afeccin.  Recibir ayuda de inmediato si no mejora o si empeora.  Esta informacin no tiene Marine scientist el consejo del mdico. Asegrese de hacerle al mdico cualquier pregunta que tenga. Document Released: 03/04/2005 Document Revised: 10/09/2014 Document Reviewed: 01/23/2013 Elsevier Interactive Patient Education  2017 [employer].

## 2017-02-09 NOTE — Progress Notes (Signed)
Subjective:  By signing my name below, I, [name], attest that this documentation has been prepared under the direction and in the presence of [name], MD Electronically Signed: Ladene Artist, ED Scribe 02/09/2017 at 4:10 PM.   Patient ID: Nicholas Potts, male    DOB: [birth year], 56 y.o.   MRN: [id number]  Chief Complaint  Patient presents with  . Labs results    wants to know the results from his PSA  . Edema    right hand swelling; states it is worse in the morning   HPI Nicholas Potts is a 56 y.o. male who presents to Primary Care at [facility] for follow-up. Stratus interpreter used (ID: [id number])  Dizziness/Vertigo Improving last visit on Meclizine. Planned for neurology follow-up for slight abnormality on head CT. Today, pt states dizziness has improved some since last visit and he has discontinued Meclizine.   R Hand Swelling Pt reports right hand selling x 6 months. Pt reports the most swelling upon waking in the morning and states swelling gradually improves as the day progresses. He uses an air gun 7-8 hours daily at work for the past 20 years. No treatments. Denies tingling in hand, injury, swelling to other joints.   Urinary Frequency  Pt reports urinary frequency x 5 months. Pt reports associated symptoms of dysuria x 3 months as well, including dysuria this past week. Pt reports a burning sensation more towards the top of his gland. He denies hematuria and sexual intercourse.   Patient Active Problem List   Diagnosis Date Noted  . IFG (impaired fasting glucose) 08/21/2016  . Obesity (BMI 30-39.9) 06/23/2013  . Stress-induced cardiomyopathy -- essentially resolved   . CVA (cerebral infarction)   . OSA (obstructive sleep apnea)   . Hyperlipidemia   . Physical deconditioning 01/24/2013  . OSA on CPAP 01/19/2013  . Tinea pedis 01/18/2013  . Essential hypertension 01/13/2013  . Accidental opiate poisoning 01/11/2013  . ARF (acute renal failure)  with tubular necrosis requiring HD 01/11/2013  . Anemia 01/11/2013  . Thrombocytopenia, unspecified ([place]) 01/11/2013  . 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 Accidental opiate poisoning 2014   post op   . ARF (acute respiratory failure) ([place]) 2014  . Blood dyscrasia   . Chronic kidney disease 2014   ARF   . Complication of anesthesia 2014   - post op - in combination of pain pills-  Cardiac and Respiratory Arrest- prior to sleep apnea diagonosis  . Constipation   . CVA (cerebral infarction)   . Hyperlipidemia   . Hypertension   . OSA (obstructive sleep apnea)   . Sleep apnea   . Stress-induced cardiomyopathy -- essentially resolved    Post-op Narcotic induced Acute Hypoxic respiratory failure with respiratory and PEA cardiac arrest following accidental narcotic overdose; initial [place] 20-20%, now improved to 55%   Was supported with Impella Cardiac Cath - non-obstructive CAD   . Thrombocytopenia ([place]) 2014   Past Surgical History:  Procedure Laterality Date  . ACHILLES TENDON REPAIR    . CARDIAC CATHETERIZATION  12/29/2012   RN LHC --> Impella. for severe shock with low EF. Mild to moderate RV pressure elevation. Normal coronary arteries. Cardiac Output 2.5, Index 1.46.  Marland Kitchen COLONOSCOPY WITH PROPOFOL N/A 10/30/2015   Procedure: COLONOSCOPY WITH PROPOFOL;  Surgeon: Mauri Pole, MD;  Location: [facility];  Service: Endoscopy;  Laterality: N/A;  .  INSERTION OF DIALYSIS CATHETER Right 01/09/2013   Procedure: INSERTION OF DIALYSIS CATHETER;  Surgeon: Angelia Mould, MD;  Location: [place];  Service: Vascular;  Laterality: Right;  . LEFT AND RIGHT HEART CATHETERIZATION WITH CORONARY ANGIOGRAM  12/29/2012   Procedure: LEFT AND RIGHT HEART CATHETERIZATION WITH CORONARY ANGIOGRAM;  Surgeon: Sherren Mocha, MD;  Location: [facility] CATH LAB;  Service: Cardiovascular;;  . TRANSTHORACIC  ECHOCARDIOGRAM  12/29/2012   EF 25% with periapical HK/AKA.  --> Followup echo 7/25 -- EF of 35%  . TRANSTHORACIC ECHOCARDIOGRAM  01/2013 ; 07/2015   a) EF 55-60%. No regional WMA. Grade 1 diastolic dysfunction. Mild LA dilatation.; b) Normal LV size with mild LV hypertrophy. EF 55-60%. Normal RV   Allergies  Allergen Reactions  . Oxycodone     Oxygen levels drop   Prior to Admission medications   Medication Sig Start Date End Date Taking? Authorizing Provider  aspirin 325 MG tablet Take 325 mg by mouth daily.    [provider]  atorvastatin (LIPITOR) 20 MG tablet Take 1 tablet (20 mg total) by mouth daily. 10/14/16 02/05/17  Leonie Man, MD  meclizine (ANTIVERT) 25 MG tablet Take 1 tablet (25 mg total) by mouth 3 (three) times daily as needed for dizziness. 02/02/17   [name], MD  Multiple Vitamins-Minerals (ONE-A-DAY 50 PLUS) TABS Take 1 tablet by mouth daily.    [provider]  omega-3 acid ethyl esters (LOVAZA) 1 g capsule Take 1 g by mouth 2 (two) times daily.    [provider]   Social History   Social History  . Marital status: Married    Spouse name: N/A  . Number of children: N/A  . Years of education: N/A   Occupational History  . Not on file.   Social History Main Topics  . Smoking status: Former Smoker    Quit date: 06/20/1986  . Smokeless tobacco: Never Used  . Alcohol use Yes     Comment: History of heavy use but only social at present.  10/29/15 - "a beer every week or so"  . Drug use: No     Comment: Prior cocaine use but none since 2000  . Sexual activity: No   Other Topics Concern  . Not on file   Social History Narrative   He is widowed for 13 years, and has 2 daughters: [name] & [address] [name], and one son Montell Leopard. the other history is daughter who is here with him today.    He works for [employer] as a [employer].   He quit smoking in 1988. He does drink  alcohol socially, but he does have a history of heavy use in the past. He also has a prior history of cocaine use but none since 2000.   Review of Systems  Genitourinary: Positive for dysuria and frequency. Negative for hematuria.  Musculoskeletal: Positive for joint swelling.  Neurological: Negative for numbness.      Objective:   Physical Exam  Constitutional: He is oriented to person, place, and time. He appears well-developed and well-nourished. No distress.  HENT:  Head: Normocephalic and atraumatic.  Eyes: Conjunctivae and EOM are normal.  Neck: Neck supple. No tracheal deviation present.  Cardiovascular: Normal rate.   Pulmonary/Chest: Effort normal. No respiratory distress.  Musculoskeletal: Normal range of motion.  Describes area of swelling to 2nd-4th metacarpal heads. Slight soft tissue swelling to dorsum of R hand between 2nd and 3rd  MCP joints. Slight tenderness between 2nd and 3rd MCP. Thumb nontender. Scaphoid nontender. Lunate nontender. Negative Tinel's at wrist. Negative Phalen's. FROM of all fingers on R hand.  Neurological: He is alert and oriented to person, place, and time.  Skin: Skin is warm and dry.  Psychiatric: He has a normal mood and affect. His behavior is normal.  Nursing note and vitals reviewed.  Vitals:   02/09/17 1540  BP: 138/82  Pulse: 98  Resp: 18  Temp: 98.7 F (37.1 C)  TempSrc: Oral  SpO2: 97%  Weight: 253 lb (114.8 kg)  Height: 5\' 6"  (1.676 m)   Dg Chest 2 View  Result Date: 02/02/2017 CLINICAL DATA:  Episodic dyspnea, nausea, vertigo EXAM: CHEST  2 VIEW COMPARISON:  01/11/2013 FINDINGS: Borderline enlargement of cardiac silhouette. Mediastinal contours and pulmonary vascularity normal. Bibasilar atelectasis. Lungs otherwise clear. No pleural effusion or pneumothorax. Scattered endplate spur formation thoracic spine. IMPRESSION: Bibasilar atelectasis. Electronically Signed   By: Lavonia Dana M.D.   On: 02/02/2017 18:22   Ct Head Wo  Contrast  Result Date: 02/03/2017 CLINICAL DATA:  Initial evaluation for acute posttraumatic headache, injury 2 and half weeks ago, worsening vertigo, ataxia. EXAM: CT HEAD WITHOUT CONTRAST TECHNIQUE: Contiguous axial images were obtained from the base of the skull through the vertex without intravenous contrast. COMPARISON:  Prior CT from 12/29/2012. FINDINGS: Brain: 3 cerebral volume within normal limits. No evidence for acute intracranial hemorrhage. No acute large vessel territory infarct. No mass lesion, midline shift or mass effect. No hydrocephalus. No extra-axial fluid collection. Bilateral symmetric cystic lucencies involving the globus palladi seen measuring approximate 5 mm each (series 2, image 16). These are chronic in appearance, but new relative to prior CT from 2014. Vascular: No hyperdense vessel. Skull: Scalp soft tissues within normal limits.  Calvarium intact. Sinuses/Orbits: Globes and orbital soft tissues within normal limits. Paranasal sinuses are clear. No mastoid effusion. IMPRESSION: 1. No acute intracranial process. 2. Symmetric subcentimeter lucencies involving the globus palladi bilaterally, chronic in appearance, but new relative to most recent CT from 2014. Primary differential considerations include prior hypoxic ischemic injury or previous toxic metabolic derangement. Drugs of abuse can also result in this appearance. 3. Otherwise normal head CT. Electronically Signed   By: Jeannine Boga M.D.   On: 02/03/2017 18:03   Dg Hand Complete Right  Result Date: 02/09/2017 CLINICAL DATA:  RIGHT hand swelling for 6 months, uses an air hammer EXAM: RIGHT HAND - COMPLETE 3+ VIEW COMPARISON:  None FINDINGS: Osseous mineralization normal. Joint spaces preserved. No acute fracture or dislocation. Lucencies within the proximal capitate which could represent subchondral cystic changes or erosions. No additional erosions identified. Mid carpal joint appears preserved. IMPRESSION: Lucencies  within the proximal capitate, could represent cystic degenerative changes or perhaps erosions from an inflammatory arthropathy, though the mid carpal joint is otherwise unremarkable in appearance. No additional acute osseous abnormalities identified. Electronically Signed   By: Lavonia Dana M.D.   On: 02/09/2017 16:42      Assessment & Plan:    Nicholas Potts is a 56 y.o. male Vertigo  - improving off meclizine. Continue sx care, plan for follow up with Neuro.   Urethritis - Plan: GC/Chlamydia Probe Amp, doxycycline (VIBRA-TABS) 100 MG tablet  - intermittent sx's. Check GC/CT testing for STI source, but less likely.   - doxycyline 100mg  BID -10 days.   -if persistent after abx treatment, consider trichomonas testing or urology eval.   Swelling of right hand -  Plan: DG Hand Complete Right, Ambulatory referral to Hand Surgery Abnormal radiograph - Plan: Ambulatory referral to Hand Surgery  - persistent R hand swelling. Uses air hammer for work. Possible capitate erosions, but pain/swelling more distal into space b/t 2-3rd MC heads.   - refer to hand/ortho for further eval and to decide on advanced imaging.   Language barrier  -spanish spoken and video interpreter used.   Meds ordered this encounter  Medications  . doxycycline (VIBRA-TABS) 100 MG tablet    Sig: Take 1 tablet (100 mg total) by mouth 2 (two) times daily.    Dispense:  20 tablet    Refill:  0   Patient Instructions  voy a referirle a specialista de mano. Hay una area abnormal poquito en hueso.   Tome antibiotico doxycycline por orina. despues esta medicina, si simptomas regrese - llamame.  Es posible referir a Therapist, art de [place].   regrese mas temprano empeorse.     Uretritis, adultos (Urethritis, Adult) La uretritis es la inflamacin del conducto a travs del cual la orina sale de la vejiga (uretra). CAUSAS La causa de la uretritis suele ser una infeccin en la uretra. La infeccin puede ser viral, como  herpes. [place] puede ser [place], Nilda Simmer. FACTORES DE RIESGO Los factores de riesgo de la uretritis incluyen:  Best boy sexo sin usar condn.  Tener mltiples parejas sexuales.  Tener una higiene deficiente. [place] sntomas de la uretritis son menos perceptibles en las mujeres que en los hombres. Estos sntomas incluyen:  Sensacin de ardor al Garment/textile technologist (disuria).  Secrecin por Geologist, engineering.  Sangre en la orina (hematuria).  Orinar ms que lo habitual. DIAGNSTICO Para confirmar un diagnstico de uretritis, su mdico har lo siguiente:  Preguntar sobre su historial sexual.  Optometrist un examen fsico.  Pedir una muestra de orina para Chiropractor.  Utilizar un hisopo de algodn para Nurse, children's de la uretra para pruebas en el laboratorio. TRATAMIENTO Es importante tratar la uretritis. Segn la causa, la uretritis no tratada puede producir infecciones genitales graves y posiblemente, la infertilidad. La uretritis causada por una infeccin bacteriana se trata con antibiticos. Todas las parejas sexuales deben tratarse. INSTRUCCIONES PARA EL CUIDADO EN EL HOGAR  No tenga relaciones sexuales hasta que se conozcan los resultados de las pruebas y se complete el tratamiento, aunque los sntomas desaparezcan antes de Occupational hygienist.  Si le recetaron antibiticos, asegrese de terminarlos aunque comience a sentirse mejor.  SOLICITE ATENCIN MDICA SI:  Sus sntomas no mejoran en [employer]  Los sntomas empeoran.  Siente dolor abdominal o plvico (en las mujeres).  Siente dolor en las articulaciones.  Tiene fiebre.  SOLICITE ATENCIN MDICA DE INMEDIATO SI:  Siente un dolor intenso en el abdomen, la espalda o un lado del cuerpo.  Ha vomitado repetidas veces.  ASEGRESE DE QUE:  Comprende estas instrucciones.  Controlar su afeccin.  Recibir ayuda de inmediato si no mejora o si empeora.  Esta  informacin no tiene Marine scientist el consejo del mdico. Asegrese de hacerle al mdico cualquier pregunta que tenga. Document Released: 03/04/2005 Document Revised: 10/09/2014 Document Reviewed: 01/23/2013 Elsevier Interactive Patient Education  [employer].   I personally performed the services described in this documentation, which was scribed in my presence. The recorded information has been reviewed and considered for accuracy and completeness, addended by me as needed, and agree with information above.  Signed,   Merri Ray, MD Primary Care at [facility]  [facility].  02/09/17 10:20 PM

## 2017-02-10 LAB — GC/CHLAMYDIA PROBE AMP
Chlamydia trachomatis, NAA: NEGATIVE
Neisseria gonorrhoeae by PCR: NEGATIVE

## 2017-03-18 ENCOUNTER — Telehealth: Payer: Self-pay | Admitting: *Deleted

## 2017-03-18 DIAGNOSIS — I5181 Takotsubo syndrome: Secondary | ICD-10-CM

## 2017-03-18 DIAGNOSIS — Z79899 Other long term (current) drug therapy: Secondary | ICD-10-CM

## 2017-03-18 DIAGNOSIS — E7849 Other hyperlipidemia: Secondary | ICD-10-CM

## 2017-03-18 DIAGNOSIS — E669 Obesity, unspecified: Secondary | ICD-10-CM

## 2017-03-18 NOTE — Telephone Encounter (Signed)
-----   Message from Raiford Simmonds, RN sent at 10/14/2016  4:38 PM EDT ----- Labs need cmp ,lipid due 04/16/17 Mail 03/16/17

## 2017-03-18 NOTE — Telephone Encounter (Signed)
MAIL LETTER AND LABSLIP- ORDERED HEPATIC , LIPID PATIENT HAD BMP IN AUG 2018.

## 2017-05-05 ENCOUNTER — Encounter: Payer: Self-pay | Admitting: Neurology

## 2017-05-05 ENCOUNTER — Ambulatory Visit: Payer: BLUE CROSS/BLUE SHIELD | Admitting: Neurology

## 2017-05-05 VITALS — BP 108/64 | HR 96 | Ht 66.0 in | Wt 243.5 lb

## 2017-05-05 DIAGNOSIS — G4489 Other headache syndrome: Secondary | ICD-10-CM

## 2017-05-05 DIAGNOSIS — R42 Dizziness and giddiness: Secondary | ICD-10-CM

## 2017-05-05 MED ORDER — TOPIRAMATE 25 MG PO TABS: ORAL_TABLET | ORAL | 3 refills | Status: DC

## 2017-05-05 MED ORDER — TRAMADOL HCL 50 MG PO TABS: 50.0000 mg | ORAL_TABLET | Freq: Four times a day (QID) | ORAL | 1 refills | Status: DC | PRN

## 2017-05-05 NOTE — Progress Notes (Signed)
Reason for visit: Abnormal CT head, dizziness  Referring physician: Dr. Dwaine Deter is a 56 y.o. male  History of present illness:  Nicholas Potts is a 56 year old right-handed Hispanic male with a history of an accidental opiate overdose that occurred in 2014 resulting in a respiratory arrest, the patient apparently coded at least 3 times, he was hypotensive.  The patient has had ongoing problems with memory since that time that has not worsened but has not returned back to normal.  The patient began having some troubles with dizziness in August 2018.  The patient indicated that he would get vertigo when he would sit up or when he would look up.  The patient also started getting some headaches that would occur off and on throughout the day, they are occurring about every 5 days or so.  The headaches are in the back of the head occasionally going to the front.  He has not had any dizziness whatsoever in 2 or 3 weeks.  He does not correlate the dizzy episodes with the headache.  He does have sleep apnea, he is on CPAP, many of his headaches occur early in the morning, but the headaches may occur anytime during the day.  The patient may have some blurring of vision with the headache.  He may have some nausea without vomiting with the dizziness.  Denies any hearing changes or ringing in the ears or ear pain.  He denies any numbness or weakness of the face, arms, or legs.  He has not had a significant change in balance.  A CT scan of the brain was done showing bilaterally symmetric lesions within the globus pallidus.  The patient is sent to this office for an evaluation.  The evaluation today was done with the use of an interpreter.  Past Medical History:  Diagnosis Date  . 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[place]) 2014   post op   . ARF (acute respiratory failure) ([place]) 2014  . Blood dyscrasia   . Chronic kidney disease 2014   ARF   . Complication of anesthesia 2014   - post op - in  combination of pain pills-  Cardiac and Respiratory Arrest- prior to sleep apnea diagonosis  . Constipation   . CVA (cerebral infarction)   . Hyperlipidemia   . Hypertension   . OSA (obstructive sleep apnea)   . Sleep apnea   . Stress-induced cardiomyopathy -- essentially resolved    Post-op Narcotic induced Acute Hypoxic respiratory failure with respiratory and PEA cardiac arrest following accidental narcotic overdose; initial [place] 20-20%, now improved to 55%   Was supported with Impella Cardiac Cath - non-obstructive CAD   . Thrombocytopenia ([place]) 2014    Past Surgical History:  Procedure Laterality Date  . ACHILLES TENDON REPAIR    . CARDIAC CATHETERIZATION  12/29/2012   RN LHC --> Impella. for severe shock with low EF. Mild to moderate RV pressure elevation. Normal coronary arteries. Cardiac Output 2.5, Index 1.46.  Marland Kitchen COLONOSCOPY WITH PROPOFOL N/A 10/30/2015   Procedure: COLONOSCOPY WITH PROPOFOL;  Surgeon: Mauri Pole, MD;  Location: [facility];  Service: Endoscopy;  Laterality: N/A;  . INSERTION OF DIALYSIS CATHETER Right 01/09/2013   Procedure: INSERTION OF DIALYSIS CATHETER;  Surgeon: Angelia Mould, MD;  Location: [facility];  Service: Vascular;  Laterality: Right;  . LEFT AND RIGHT HEART CATHETERIZATION WITH CORONARY ANGIOGRAM  12/29/2012   Procedure: LEFT AND RIGHT HEART CATHETERIZATION WITH CORONARY ANGIOGRAM;  Surgeon: Sherren Mocha, MD;  Location: [facility] CATH LAB;  Service: Cardiovascular;;  . TRANSTHORACIC ECHOCARDIOGRAM  12/29/2012   EF 25% with periapical HK/AKA.  --> Followup echo 7/25 -- EF of 35%  . TRANSTHORACIC ECHOCARDIOGRAM  01/2013 ; 07/2015   a) EF 55-60%. No regional WMA. Grade 1 diastolic dysfunction. Mild LA dilatation.; b) Normal LV size with mild LV hypertrophy. EF 55-60%. Normal RV    Family History  Problem Relation Age of Onset  . Cancer Mother        Liver cancer  . Cancer Father        Leukemia  . Cancer Maternal Grandmother        Liver cancer    . Cancer Paternal Grandmother        Colon cancer  . Colon cancer Neg Hx     Social history:  reports that he quit smoking about 30 years ago. he has never used smokeless tobacco. He reports that he drinks alcohol. He reports that he does not use drugs.  Medications:  Prior to Admission medications   Medication Sig Start Date End Date Taking? Authorizing Provider  atorvastatin (LIPITOR) 20 MG tablet Take 1 tablet (20 mg total) by mouth daily. 10/14/16 02/05/17  Leonie Man, MD      Allergies  Allergen Reactions  . Oxycodone     Oxygen levels drop    ROS:  Out of a complete 14 system review of symptoms, the patient complains only of the following symptoms, and all other reviewed systems are negative.  Fatigue Snoring Incontinence of the bladder Joint pain Allergies Memory loss, headache, weakness, dizziness Decreased energy  Blood pressure 108/64, pulse 96, height 5\' 6"  (1.676 m), weight 243 lb 8 oz (110.5 kg).  Physical Exam  General: The patient is alert and cooperative at the time of the examination.  The patient is obese.  Eyes: Pupils are equal, round, and reactive to light. Discs are flat bilaterally.  Neck: The neck is supple, no carotid bruits are noted.  Respiratory: The respiratory examination is clear.  Cardiovascular: The cardiovascular examination reveals a regular rate and rhythm, no obvious murmurs or rubs are noted.  Skin: Extremities are without significant edema.  Neurologic Exam  Mental status: The patient is alert and oriented x 3 at the time of the examination. The patient has apparent normal recent and remote memory, with an apparently normal attention span and concentration ability.  Cranial nerves: Facial symmetry is present. There is good sensation of the face to pinprick and soft touch bilaterally. The strength of the facial muscles and the muscles to head turning and shoulder shrug are normal bilaterally. Speech is well enunciated, no  aphasia or dysarthria is noted. Extraocular movements are full. Visual fields are full. The tongue is midline, and the patient has symmetric elevation of the soft palate. No obvious hearing deficits are noted.  Motor: The motor testing reveals 5 over 5 strength of all 4 extremities. Good symmetric motor tone is noted throughout.  Sensory: Sensory testing is intact to pinprick, soft touch, vibration sensation, and position sense on all 4 extremities. No evidence of extinction is noted.  Coordination: Cerebellar testing reveals good finger-nose-finger and heel-to-shin bilaterally.  Gait and station: Gait is normal. Tandem gait is normal. Romberg is negative. No drift is seen.  Reflexes: Deep tendon reflexes are symmetric and normal bilaterally. Toes are downgoing bilaterally.   CT brain 02/03/17:  IMPRESSION: 1. No acute intracranial process. 2. Symmetric subcentimeter lucencies involving the globus palladi bilaterally, chronic in  appearance, but new relative to most recent CT from 2014. Primary differential considerations include prior hypoxic ischemic injury or previous toxic metabolic derangement. Drugs of abuse can also result in this appearance. 3. Otherwise normal head CT.  * CT scan images were reviewed online. I agree with the written report.    Assessment/Plan:  1.  Episodic dizziness, possible positional vertigo  2.  Episodic headache  3.  Sleep apnea on CPAP  4.  History of cardiac arrest, residual memory disorder  5.  Abnormal CT brain  The patient has lesions in the brain that are quite symmetric, likely are related to hypotension associated with the cardiac arrest events.  The patient is now having some dizziness that has improved over the last several weeks.  The headache remains a problem for him, he wishes to have treatment for it.  The patient will be placed on Topamax, gradually working up on the dose.  Ultram will be given if needed for the headache, he claims  that Advil does not benefit him at all.  The patient will follow-up in 3 months.  Jill Alexanders MD 05/05/2017 2:01 PM  [facility] [address] [place], [place] [address]  Phone [phone] Fax [phone]

## 2017-05-05 NOTE — Patient Instructions (Signed)
We will start Topamax for the headache.   Topamax (topiramate) is a seizure medication that has an FDA approval for seizures and for migraine headache. Potential side effects of this medication include weight loss, cognitive slowing, tingling in the fingers and toes, and carbonated drinks will taste bad. If any significant side effects are noted on this drug, please contact our office.  

## 2017-05-13 ENCOUNTER — Encounter: Payer: Self-pay | Admitting: Physician Assistant

## 2017-05-13 ENCOUNTER — Ambulatory Visit: Payer: BLUE CROSS/BLUE SHIELD | Admitting: Physician Assistant

## 2017-05-13 VITALS — BP 112/62 | HR 96 | Ht 67.0 in | Wt 255.6 lb

## 2017-05-13 DIAGNOSIS — I428 Other cardiomyopathies: Secondary | ICD-10-CM

## 2017-05-13 DIAGNOSIS — Z79899 Other long term (current) drug therapy: Secondary | ICD-10-CM

## 2017-05-13 DIAGNOSIS — E785 Hyperlipidemia, unspecified: Secondary | ICD-10-CM

## 2017-05-13 DIAGNOSIS — I10 Essential (primary) hypertension: Secondary | ICD-10-CM

## 2017-05-13 MED ORDER — ASPIRIN EC 81 MG PO TBEC: 81.0000 mg | DELAYED_RELEASE_TABLET | Freq: Every day | ORAL | 3 refills | Status: DC

## 2017-05-13 NOTE — Progress Notes (Addendum)
Cardiology Office Note    Date:  05/15/2017   ID:  Nicholas Potts, DOB June 01, 1961, MRN [id number]  PCP:  Nicholas Agreste, MD  Cardiologist:  Dr. Ellyn Potts.  Chief Complaint  Patient presents with  . Follow-up    seen for Dr. Ellyn Potts.     History of Present Illness:  Nicholas Potts is a 56 y.o. male with PMH of NICM in the setting of severe shock, HTN, HLD, OSA.  Patient was initially admitted in July 2014 during which time she he suffered a severe shock and a stress-induced cardiomyopathy associated with hypoxic respiratory arrest in the setting of accidental narcotic overdose following surgery.  This occurred shortly after Achilles tendon repair.  After he took oxycodone pain medication, he became unresponsive and foaming at the mouth.  Initial echocardiogram on 12/29/2012 showed EF 25%.  Cardiac catheterization showed only mild disease.  Cardiac index was 1.46, cardiac output was 3.5.  While in the Cath Lab, he required Nicholas Potts for LV support.  Serial echocardiogram done in the next few days showed recovery of EF, by January 11, 2013, her EF has normalized.  Last echocardiogram obtained on 08/22/2015 showed EF 55-60%, mild LVH, grade 1 DD grade.  Patient presents today for his annual visit.  His daughter acted as Optometrist.  He denies any significant chest discomfort or shortness of breath.  His main complaint is that ever since his respiratory arrest, he has been having memory issues.  His memory issues seems to be quite steady and that has not changed in the recent years.  Otherwise he has no lower extremity edema, orthopnea or PND.  He has been feeling fatigued recently.   Past Medical History:  Diagnosis Date  . 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Nicholas Potts) 2014   post op   . ARF (acute respiratory failure) ([place]) 2014  . Blood dyscrasia   . Chronic kidney disease 2014   ARF   . Complication of anesthesia 2014   - post op - in combination of pain pills-  Cardiac and Respiratory  Arrest- prior to sleep apnea diagonosis  . Constipation   . CVA (cerebral infarction)   . Hyperlipidemia   . Hypertension   . OSA (obstructive sleep apnea)   . Sleep apnea   . Stress-induced cardiomyopathy -- essentially resolved    Post-op Narcotic induced Acute Hypoxic respiratory failure with respiratory and PEA cardiac arrest following accidental narcotic overdose; initial [place] 20-20%, now improved to 55%   Was supported with Impella Cardiac Cath - non-obstructive CAD   . Thrombocytopenia ([place]) 2014    Past Surgical History:  Procedure Laterality Date  . ACHILLES TENDON REPAIR    . CARDIAC CATHETERIZATION  12/29/2012   RN LHC --> Impella. for severe shock with low EF. Mild to moderate RV pressure elevation. Normal coronary arteries. Cardiac Output 2.5, Index 1.46.  Mar[facility] Kitchen COLONOSCOPY WITH PROPOFOL N/A 10/30/2015   Procedure: COLONOSCOPY WITH PROPOFOL;  Surgeon: Nicholas Pole, MD;  Location: [facility];  Service: Endoscopy;  Laterality: N/A;  . INSERTION OF DIALYSIS CATHETER Right 01/09/2013   Procedure: INSERTION OF DIALYSIS CATHETER;  Surgeon: Nicholas Mould, MD;  Location: [facility];  Service: Vascular;  Laterality: Right;  . LEFT AND RIGHT HEART CATHETERIZATION WITH CORONARY ANGIOGRAM  12/29/2012   Procedure: LEFT AND RIGHT HEART CATHETERIZATION WITH CORONARY ANGIOGRAM;  Surgeon: Nicholas Mocha, MD;  [facility] C[facility] LAB;  Service: Cardiovascular;;  . TRANSTHORACIC ECHOCARDIOGRAM  12/29/2012   EF 25% with periapical HK/AKA.  -->  Followup echo 7/25 -- EF of 35%  . TRANSTHORACIC ECHOCARDIOGRAM  01/2013 ; 07/2015   a) EF 55-60%. No regional WMA. Grade 1 diastolic dysfunction. Mild LA dilatation.; b) Normal LV size with mild LV hypertrophy. EF 55-60%. Normal RV    Current Medications: Outpatient Medications Prior to Visit  Medication Sig Dispense Refill  . topiramate (TOPAMAX) 25 MG tablet Take one tablet at night for one week, then take 2 tablets at night for one week, then take 3  tablets at night. 90 tablet 3  . traMADol (ULTRAM) 50 MG tablet Take 1 tablet (50 mg total) by mouth every 6 (six) hours as needed. 30 tablet 1  . atorvastatin (LIPITOR) 20 MG tablet Take 1 tablet (20 mg total) by mouth daily. 90 tablet 3   No facility-administered medications prior to visit.      Allergies:   Oxycodone   Social History   Socioeconomic History  . Marital status: Married    Spouse name: None  . Number of children: None  . Years of education: None  . Highest education level: None  Social Needs  . Financial resource strain: None  . Food insecurity - worry: None  . Food insecurity - inability: None  . Transportation needs - medical: None  . Transportation needs - non-medical: None  Occupational History  . None  Tobacco Use  . Smoking status: Former Smoker    Last attempt to quit: 06/20/1986    Years since quitting: 30.9  . Smokeless tobacco: Never Used  Substance and Sexual Activity  . Alcohol use: Yes    Comment: History of heavy use but only social at present.  10/29/15 - "a beer every week or so"  . Drug use: No    Comment: Prior cocaine use but none since 2000  . Sexual activity: No  Other Topics Concern  . None  Social History Narrative   He is widowed for 13 years, and has 2 daughters: Nicholas Potts & [address] Nicholas Potts, and one son Nicholas Potts. the other history is daughter who is here with him today.    He works for [employer] as a [employer].   He quit smoking in 1988. He does drink alcohol socially, but he does have a history of heavy use in the past. He also has a prior history of cocaine use but none since 2000.     Family History:  The patient's family history includes Cancer in his father, maternal grandmother, mother, and paternal grandmother.   ROS:   Please see the history of present illness.    ROS All other systems reviewed and are negative.   PHYSICAL EXAM:   VS:  BP 112/62   Pulse 96   Ht  5\' 7"  (1.702 m)   Wt 255 lb 9.6 oz (115.9 kg)   BMI 40.03 kg/m    GEN: Well nourished, well developed, in no acute distress  HEENT: normal  Neck: no JVD, carotid bruits, or masses Cardiac: RRR; no murmurs, rubs, or gallops,no edema  Respiratory:  clear to auscultation bilaterally, normal work of breathing GI: soft, nontender, nondistended, + BS MS: no deformity or atrophy  Skin: warm and dry, no rash Neuro:  Alert and Oriented x 3, Strength and sensation are intact Psych: euthymic mood, full affect  Wt Readings from Last 3 Encounters:  05/13/17 255 lb 9.6 oz (115.9 kg)  05/05/17 243 lb 8 oz (110.5 kg)  02/09/17 253 lb (114.8 kg)  Studies/Labs Reviewed:   EKG:  EKG is not ordered today.   Recent Labs: 08/22/2016: ALT 17 02/02/2017: BUN 22; Creatinine, Ser 1.14; Hemoglobin 14.8; Potassium 4.4; Sodium 143   Lipid Panel    Component Value Date/Time   CHOL 198 08/22/2016 0937   TRIG 165 (H) 08/22/2016 0937   HDL 32 (L) 08/22/2016 0937   CHOLHDL 6.2 (H) 08/22/2016 0937   VLDL 33 (H) 08/22/2016 0937   LDLCALC 133 (H) 08/22/2016 0937    Additional studies/ records that were reviewed today include:    [facility] 12/29/2012 Procedural Findings: Hemodynamics RA 25 RV 44/27 PA 44/33 with a mean of 38 PCWP 30 LV 101/33 AO 101/80 with a mean of 90  Oxygen saturations: PA 49 AO 90  Cardiac Output (Fick) 3.5  Cardiac Index (Fick) 1.46  Coronary angiography: Coronary dominance: right  Left mainstem: The left main is patent without significant disease.  Left anterior descending (LAD): The LAD is patent without significant disease. There are diffuse irregularities noted. There is slow flow throughout the entire coronary tree. Distal vessel disease is noted and vasospasm is suspected.  Left circumflex (LCx): The left circumflex is patent. The obtuse marginal branches are patent without significant disease.  Right coronary artery (RCA): The RCA is patent. There are  diffuse irregularities. The PDA and PL branches are patent. There is slow distal flow similar to the appearance of the left coronary artery tree.  Left ventriculography: The base of the heart is hyperdynamic. The entire anterior wall, apical wall, and midinferior wall are akinetic. The estimated left ventricular ejection fraction is 20%  Final Conclusions:   1. Severe cardiogenic shock with severe left ventricular systolic dysfunction 2. Mild nonobstructive coronary artery disease with slow coronary flow     Echo 08/22/2015 LV EF: 55% -   60%  Study Conclusions  - Left ventricle: The cavity size was normal. Wall thickness was   increased in a pattern of mild LVH. Systolic function was normal.   The estimated ejection fraction was in the range of 55% to 60%.   Wall motion was normal; there were no regional wall motion   abnormalities. Doppler parameters are consistent with abnormal   left ventricular relaxation (grade 1 diastolic dysfunction). - Aortic valve: Poorly visualized. There was no stenosis. There was   trivial regurgitation. - Mitral valve: There was no significant regurgitation. - Right ventricle: The cavity size was normal. Systolic function   was normal. - Pulmonary arteries: No complete TR doppler jet so unable to   estimate PA systolic pressure. - Inferior vena cava: The vessel was normal in size. The   respirophasic diameter changes were in the normal range (>= 50%),   consistent with normal central venous pressure.  Impressions:  - Normal LV size with mild LV hypertrophy. EF 55-60%. Normal RV   size and systolic function. No significant valvular   abnormalities.   ASSESSMENT:    1. NICM (nonischemic cardiomyopathy) ([place])   2. Medication management   3. Essential hypertension   4. Hyperlipidemia, unspecified hyperlipidemia type      PLAN:  In order of problems listed above:  1. NICM: Occurred in the setting of shock and respiratory arrest,  ejection fraction has normalized on the last echocardiogram in 2017.    2. Fatigue: He has been having some fatigue lately, obtain CBC, CMET and TSH.  If symptoms persist or will need to repeat echocardiogram  3. Hypertension: Blood pressure stable  4. Hyperlipidemia: Continue Lipitor 20 mg  daily.  Due for fasting lipid panel    Medication Adjustments/Labs and Tests Ordered: Current medicines are reviewed at length with the patient today.  Concerns regarding medicines are outlined above.  Medication changes, Labs and Tests ordered today are listed in the Patient Instructions below. Patient Instructions  Medication Instructions:  NO CHANGES  If you need a refill on your cardiac medications before your next appointment, please call your pharmacy.  Labwork: FASTING LIPID PANES, CMET, CBC, TSH HERE IN OUR OFFICE AT [facility]  Take the provided lab slips for you to take with you to the lab for you blood draw.   You will need to fast. DO NOT EAT OR DRINK PAST MIDNIGHT.   You may go to any [facility] lab that is convenient for you however, we do have a lab in our office that is able to assist you. You do NOT need an appointment for our lab. Once in our office lobby there is a podium to the right of the check-in desk where you are to sign-in and ring a doorbell to alert [place] you are here. Lab is open Monday-Friday from 8:00am to 4:00pm; and is closed for lunch from 12:45p-1:45pm   Follow-Up: Your physician wants you to follow-up in: [place]. You should receive a reminder letter in the mail two months in advance. If you do not receive a letter, please call our office April 2019 to schedule the June 2019 follow-up appointment.   Thank you for choosing [facility] at [facility], [place]  05/15/2017 2:23 PM    [address], [place], [place]  [address] Phone: [phone]; Fax: [phone]

## 2017-05-13 NOTE — Patient Instructions (Signed)
Medication Instructions:  NO CHANGES  If you need a refill on your cardiac medications before your next appointment, please call your pharmacy.  Labwork: FASTING LIPID PANES, CMET, CBC, TSH HERE IN OUR OFFICE AT [facility]  Take the provided lab slips for you to take with you to the lab for you blood draw.   You will need to fast. DO NOT EAT OR DRINK PAST MIDNIGHT.   You may go to any [facility] lab that is convenient for you however, we do have a lab in our office that is able to assist you. You do NOT need an appointment for our lab. Once in our office lobby there is a podium to the right of the check-in desk where you are to sign-in and ring a doorbell to alert [place] you are here. Lab is open Monday-Friday from 8:00am to 4:00pm; and is closed for lunch from 12:45p-1:45pm   Follow-Up: Your physician wants you to follow-up in: [place]. You should receive a reminder letter in the mail two months in advance. If you do not receive a letter, please call our office April 2019 to schedule the June 2019 follow-up appointment.   Thank you for choosing [facility] at [facility]!!

## 2017-05-15 ENCOUNTER — Encounter: Payer: Self-pay | Admitting: Physician Assistant

## 2017-05-28 LAB — COMPREHENSIVE METABOLIC PANEL
ALBUMIN: 4.4 g/dL (ref 3.5–5.5)
ALT: 25 IU/L (ref 0–44)
AST: 25 IU/L (ref 0–40)
Albumin/Globulin Ratio: 1.6 (ref 1.2–2.2)
Alkaline Phosphatase: 93 IU/L (ref 39–117)
BILIRUBIN TOTAL: 0.4 mg/dL (ref 0.0–1.2)
BUN / CREAT RATIO: 12 (ref 9–20)
BUN: 12 mg/dL (ref 6–24)
CHLORIDE: 105 mmol/L (ref 96–106)
CO2: 21 mmol/L (ref 20–29)
Calcium: 8.8 mg/dL (ref 8.7–10.2)
Creatinine, Ser: 1.04 mg/dL (ref 0.76–1.27)
GFR calc non Af Amer: 79 mL/min/{1.73_m2} (ref >59)
GFR, EST AFRICAN AMERICAN: 91 mL/min/{1.73_m2} (ref >59)
GLUCOSE: 89 mg/dL (ref 65–99)
Globulin, Total: 2.8 g/dL (ref 1.5–4.5)
Potassium: 4.3 mmol/L (ref 3.5–5.2)
Sodium: 139 mmol/L (ref 134–144)
TOTAL PROTEIN: 7.2 g/dL (ref 6.0–8.5)

## 2017-05-28 LAB — LIPID PANEL
CHOLESTEROL TOTAL: 145 mg/dL (ref 100–199)
Chol/HDL Ratio: 3.8 ratio (ref 0.0–5.0)
HDL: 38 mg/dL — AB (ref >39)
LDL Calculated: 84 mg/dL (ref 0–99)
TRIGLYCERIDES: 117 mg/dL (ref 0–149)
VLDL Cholesterol Cal: 23 mg/dL (ref 5–40)

## 2017-05-28 LAB — TSH: TSH: 2.33 u[IU]/mL (ref 0.450–4.500)

## 2017-05-28 LAB — CBC
HEMATOCRIT: 42.9 % (ref 37.5–51.0)
Hemoglobin: 14.8 g/dL (ref 13.0–17.7)
MCH: 32.7 pg (ref 26.6–33.0)
MCHC: 34.5 g/dL (ref 31.5–35.7)
MCV: 95 fL (ref 79–97)
Platelets: 293 10*3/uL (ref 150–379)
RBC: 4.52 x10E6/uL (ref 4.14–5.80)
RDW: 12.9 % (ref 12.3–15.4)
WBC: 7.7 10*3/uL (ref 3.4–10.8)

## 2017-07-21 DIAGNOSIS — G4733 Obstructive sleep apnea (adult) (pediatric): Secondary | ICD-10-CM | POA: Diagnosis not present

## 2017-08-20 DIAGNOSIS — G4733 Obstructive sleep apnea (adult) (pediatric): Secondary | ICD-10-CM | POA: Diagnosis not present

## 2017-09-02 ENCOUNTER — Ambulatory Visit: Payer: BLUE CROSS/BLUE SHIELD | Admitting: Adult Health

## 2017-09-02 ENCOUNTER — Encounter: Payer: Self-pay | Admitting: Adult Health

## 2017-09-20 DIAGNOSIS — G4733 Obstructive sleep apnea (adult) (pediatric): Secondary | ICD-10-CM | POA: Diagnosis not present

## 2017-10-09 ENCOUNTER — Encounter: Payer: BLUE CROSS/BLUE SHIELD | Admitting: Family Medicine

## 2017-10-20 DIAGNOSIS — G4733 Obstructive sleep apnea (adult) (pediatric): Secondary | ICD-10-CM | POA: Diagnosis not present

## 2017-10-21 ENCOUNTER — Ambulatory Visit (INDEPENDENT_AMBULATORY_CARE_PROVIDER_SITE_OTHER): Payer: BLUE CROSS/BLUE SHIELD | Admitting: Family Medicine

## 2017-10-21 ENCOUNTER — Encounter: Payer: Self-pay | Admitting: Family Medicine

## 2017-10-21 VITALS — BP 120/62 | HR 95 | Temp 99.3°F | Ht 66.5 in | Wt 258.8 lb

## 2017-10-21 DIAGNOSIS — Z130 Encounter for screening for diseases of the blood and blood-forming organs and certain disorders involving the immune mechanism: Secondary | ICD-10-CM

## 2017-10-21 DIAGNOSIS — Z0000 Encounter for general adult medical examination without abnormal findings: Secondary | ICD-10-CM

## 2017-10-21 DIAGNOSIS — R3915 Urgency of urination: Secondary | ICD-10-CM | POA: Diagnosis not present

## 2017-10-21 DIAGNOSIS — R300 Dysuria: Secondary | ICD-10-CM

## 2017-10-21 DIAGNOSIS — E785 Hyperlipidemia, unspecified: Secondary | ICD-10-CM

## 2017-10-21 DIAGNOSIS — Z9989 Dependence on other enabling machines and devices: Secondary | ICD-10-CM | POA: Diagnosis not present

## 2017-10-21 DIAGNOSIS — R5383 Other fatigue: Secondary | ICD-10-CM

## 2017-10-21 DIAGNOSIS — G4733 Obstructive sleep apnea (adult) (pediatric): Secondary | ICD-10-CM | POA: Diagnosis not present

## 2017-10-21 DIAGNOSIS — Z8679 Personal history of other diseases of the circulatory system: Secondary | ICD-10-CM | POA: Diagnosis not present

## 2017-10-21 DIAGNOSIS — Z23 Encounter for immunization: Secondary | ICD-10-CM

## 2017-10-21 LAB — POC MICROSCOPIC URINALYSIS (UMFC): Mucus: ABSENT

## 2017-10-21 LAB — POCT URINALYSIS DIP (MANUAL ENTRY)
BILIRUBIN UA: NEGATIVE
Glucose, UA: NEGATIVE mg/dL
LEUKOCYTES UA: NEGATIVE
NITRITE UA: NEGATIVE
RBC UA: NEGATIVE
Spec Grav, UA: >=1.03 — AB (ref 1.010–1.025)
Urobilinogen, UA: 0.2 E.U./dL
pH, UA: 6 (ref 5.0–8.0)

## 2017-10-21 MED ORDER — ZOSTER VAC RECOMB ADJUVANTED 50 MCG/0.5ML IM SUSR: 0.5000 mL | Freq: Once | INTRAMUSCULAR | 1 refills | Status: AC

## 2017-10-21 NOTE — Patient Instructions (Addendum)
Please return to discuss back and neck pain further, and we can discuss previous treatment and determine if imaging is needed at that time.   If blood sugar elevated or cholesterol is high, we can repeat after fasting for 8 hours.   If headaches return - follow up with neurologist.   I will check some blood work for fatigue, but would recommend following up with cardiology as they discussed possible repeat echocardiogram. I will also refer you for repeat evaluation with Dr. Annamaria Boots - sleep specialist - to determine if repeat sleep study is needed.   I will check urine tests, but if those are normal I would recommend meeting with urologist.    Keeping you healthy  Get these tests  Blood pressure- Have your blood pressure checked once a year by your healthcare provider.  Normal blood pressure is 120/80  Weight- Have your body mass index (BMI) calculated to screen for obesity.  BMI is a measure of body fat based on height and weight. You can also calculate your own BMI at ViewBanking.si.  Cholesterol- Have your cholesterol checked every year.  Diabetes- Have your blood sugar checked regularly if you have high blood pressure, high cholesterol, have a family history of diabetes or if you are overweight.  Screening for Colon Cancer- Colonoscopy starting at age 4.  Screening may begin sooner depending on your family history and other health conditions. Follow up colonoscopy as directed by your Gastroenterologist.  Screening for Prostate Cancer- Both blood work (PSA) and a rectal exam help screen for Prostate Cancer.  Screening begins at age 61 with African-American men and at age 29 with Caucasian men.  Screening may begin sooner depending on your family history.  Take these medicines  Aspirin- One aspirin daily can help prevent Heart disease and Stroke - but discuss with cardiologist  Flu shot- Every fall.  Tetanus- Every 10 years.  Shingles vaccine sent to your pharmacy.    Pneumonia shot- Once after the age of 21; if you are younger than 56, ask your healthcare provider if you need a Pneumonia shot.  Take these steps  Don't smoke- If you do smoke, talk to your doctor about quitting.  For tips on how to quit, go to www.smokefree.gov or call 1-800-QUIT-NOW.  Be physically active- Exercise 5 days a week for at least 30 minutes.  If you are not already physically active start slow and gradually work up to 30 minutes of moderate physical activity.  Examples of moderate activity include walking briskly, mowing the yard, dancing, swimming, bicycling, etc.  Eat a healthy diet- Eat a variety of healthy food such as fruits, vegetables, low fat milk, low fat cheese, yogurt, lean meant, poultry, fish, beans, tofu, etc. For more information go to www.thenutritionsource.org  Drink alcohol in moderation- Limit alcohol intake to less than two drinks a day. Never drink and drive.  Dentist- Brush and floss twice daily; visit your dentist twice a year.  Depression- Your emotional health is as important as your physical health. If you're feeling down, or losing interest in things you would normally enjoy please talk to your healthcare provider.  Eye exam- Visit your eye doctor every year.  Safe sex- If you may be exposed to a sexually transmitted infection, use a condom.  Seat belts- Seat belts can save your life; always wear one.  Smoke/Carbon Monoxide detectors- These detectors need to be installed on the appropriate level of your home.  Replace batteries at least once a year.  Skin  cancer- When out in the sun, cover up and use sunscreen 15 SPF or higher.  Violence- If anyone is threatening you, please tell your healthcare provider.  Living Will/ Health care power of attorney- Speak with your healthcare provider and family. Fatiga (Fatigue) La fatiga es una sensacin de cansancio en todo momento, falta de energa o falta de motivacin. La fatiga ocasional o leve con  frecuencia es una reaccin normal a la actividad o la vida en general. Sin embargo, la fatiga de Engineer, site duracin (crnica) o extrema puede indicar una enfermedad preexistente. INSTRUCCIONES PARA EL CUIDADO EN EL HOGAR Controle su fatiga para ver si hay cambios. Las siguientes indicaciones ayudarn a Writer [employer] pueda sentir:  Hable con el mdico acerca de cunto debe dormir cada noche. Trate de dormir la cantidad de tiempo requerida todas las noches.  Tome los medicamentos solamente como se lo haya indicado el mdico.  Siga una dieta saludable y nutritiva. Pida ayuda al mdico si necesita hacer cambios en su dieta.  Beba suficiente lquido para Consulting civil engineer orina clara o de color amarillo plido.  Practique actividades que lo relajen, como yoga, meditacin, terapia de [place] o acupuntura.  Haga ejercicios regularmente.  Cambie las situaciones que le provocan estrs. Trate de que su [place] y personal sea moderada.  No consuma drogas.  Limite el consumo de alcohol a no ms de 1 medida por da si es mujer y no est Music therapist, y 2 medidas si es hombre. Una medida equivale a 12onzas de cerveza, 5onzas de vino o 1onzas de bebidas alcohlicas de alta graduacin.  Tome una multivitamina, si se lo indic el mdico. SOLICITE ATENCIN MDICA SI:  La fatiga no mejora.  Tiene fiebre.  Tiene prdida o aumento involuntario de [place].  Tiene dolores de [place].  Tiene dificultad para: ? Dormirse. ? Dormir durante toda la noche.  Se siente enojado, culpable, ansioso o triste.  No puede defecar (estreimiento).  Tiene la piel seca.  Tiene hinchadas las piernas u otra parte del cuerpo. SOLICITE ATENCIN MDICA DE INMEDIATO SI:  Se siente confundido.  Tiene visin borrosa.  Sufre mareos o se desmaya.  Sufre un dolor intenso de [place].  Siente dolor intenso en el abdomen, la pelvis o la espalda.  Tiene dolor de pecho, dificultad para respirar, o  latidos cardacos irregulares o acelerados.  No puede orinar u [employer] de lo normal.  Presenta sangrado anormal, como sangrado del recto, la vagina, la nariz, los pulmones o los pezones.  Vomita sangre.  Tiene pensamientos acerca de hacerse dao a s mismo o cometer suicidio.  Le preocupa la posibilidad de hacerle dao a otra persona. Esta informacin no tiene Marine scientist el consejo del mdico. Asegrese de hacerle al mdico cualquier pregunta que tenga. Document Released: 09/10/2008 Document Revised: 09/16/2015 Document Reviewed: 09/26/2013 Elsevier Interactive Patient Education  2018 [employer].    IF you received an x-ray today, you will receive an invoice from [facility] Radiology. Please contact [facility] Rad[facility] at [phone] with questions or concerns regarding your invoice.   IF you received labwork today, you will receive an invoice from [facility]. Please contact [facility] at [phone] with questions or concerns regarding your invoice.   Our billing staff will not be able to assist you with questions regarding bills from these companies.  You will be contacted with the lab results as soon as they are available. The fastest way to get your results is to activate your My Chart account. Instructions  are located on the last page of this paperwork. If you have not heard from [place] regarding the results in 2 weeks, please contact this office.

## 2017-10-21 NOTE — Progress Notes (Signed)
Subjective:  By signing my name below, I, [name], attest that this documentation has been prepared under the direction and in the presence of [name], MD Electronically Signed: Ladene Potts, ED Scribe 10/21/2017 at 4:01 PM.   Patient ID: Nicholas Potts, male    DOB: [birth year], 57 y.o.   MRN: [id number]  Chief Complaint  Patient presents with  . Annual Exam    CPE- PHQ score 5, sleep questions  . Fatigue   HPI Nicholas Potts is a 57 y.o. male who presents to Primary Care at [facility] for an annual exam and other concerns. Last seen in Aug and Sept 2018 for dizziness/vertigo. Pt's daughter is present to translate.  Vertigo Improving at last visit. Had been able to stop meclizine. Planned for neuro f/u for a few head CT abnormalities. Seen 11/28 by Dr. Jannifer Franklin. Lesions on brain likely related to hypotension associated with cardiac arrest events. Continued to have HA at that time. Started on Topamax with plan to f/u in 3 months. - Pt states dizziness and HAs have resolved. He has stopped Topamax and Tramadol.  Urinary Frequency Discussed in Sept. Neg urine culture. Normal PSA on 02/05/17. - Pt reports that urinary frequency has slightly improved since Sept but he occasionally has dysuria and urinary urgency. Reports going to urinate 6-7 times/day and only feels dysuria ~once-twice/day. Pt is widowed and does not have any new sexual partners. Denies penile discharge, penile pain, nocturia.  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 Admitted July 2014. Stress inducer cardiomyopathy. Hypoxic respiratory arrest in the setting of accidental narcotic overdose following surgery. Initial echo 2014, EF 25%. Cardiac cath mild disease only. Had improvement on function with echo 08/21/25. EF 55-60%.  H/o OSA on CPAP He was having some fatigue at cardiology visit in Dec. Normal TSH, CMP and CBC. H/o OSA on CPAP. Sleep study with Dr. Annamaria Boots 03/22/13. Recommended  pressure of 17 cm water at that time. - Pt reports that he has been feeling fatigued "especially in his muscles" since he returned to work since [place] in 2014. States he sleeps with his CPAP nightly on a pressure of 17. Daughter reports that pt snores even with CPAP. Denies cp, sob.  Back and Neck Pain Pt reports that he has been having back and neck pain x 1 yr. He has been seeing a chiropractor but has not noticed any improvement. Pt plans to f/u on this further at the next visit.  L Leg Numbness Pt states that he has had L leg numbness since MI in 2014. Reports that he never regained full sensation in his leg. He discussed this with Dr. Jannifer Franklin who suspected that it may have been a side-effect from surgery. Pt plans to return at another visit to f/u on this further.  Hyperlipidemia Lab Results  Component Value Date   CHOL 145 05/28/2017   HDL 38 (L) 05/28/2017   LDLCALC 84 05/28/2017   TRIG 117 05/28/2017   CHOLHDL 3.8 05/28/2017   Lab Results  Component Value Date   ALT 25 05/28/2017   AST 25 05/28/2017   ALKPHOS 93 05/28/2017   BILITOT 0.4 05/28/2017  Lipitor 20 mg qd. Pt is not fasting at this visit; last ate around noon today.  CA Screening Colonoscopy: 10/30/15 Dr. Nyoka Cowden, rpt in 3 yrs due to multiple polyps Prostate CA Screening: normal PSA as above, 0.7 on 02/05/17. Pt agrees to DRE and PSA at this visit. Lab Results  Component Value  Date   PSA 0.82 06/29/2015   Family Hx Father passed from leukemia. Mother passed from liver CA.  Immunizations Immunization History  Administered Date(s) Administered  . Influenza,inj,Quad PF,6+ Mos 06/29/2015  Tetanus: updated today Shingrix: sent to pharmacy  Depression Screening Depression screen [facility] 2/9 10/21/2017 02/09/2017 02/05/2017 02/02/2017 06/29/2015  Decreased Interest 2 0 0 0 0  Down, Depressed, Hopeless 0 0 0 1 0  PHQ - 2 Score 2 0 0 1 0  Altered sleeping 0 - - - -  Tired, decreased energy 3 - - - -  Change in appetite 0 -  - - -  Feeling bad or failure about yourself  0 - - - -  Trouble concentrating 0 - - - -  Moving slowly or fidgety/restless 0 - - - -  Suicidal thoughts 0 - - - -  PHQ-9 Score 5 - - - -  Difficult doing work/chores Not difficult at all - - - -    Visual Acuity Screening   Right eye Left eye Both eyes  Without correction: 20/20-1 20/20-1 20/20-1  With correction:      Vision: followed annually Dentist: followed every 3 months  Exercise: none regularly; build furniture for a living  Patient Active Problem List   Diagnosis Date Noted  . Other headache syndrome 05/05/2017  . Vertigo 05/05/2017  . IFG (impaired fasting glucose) 08/21/2016  . Obesity (BMI 30-39.9) 06/23/2013  . Stress-induced cardiomyopathy -- essentially resolved   . CVA (cerebral infarction)   . OSA (obstructive sleep apnea)   . Hyperlipidemia   . Physical deconditioning 01/24/2013  . OSA on CPAP 01/19/2013  . Tinea pedis 01/18/2013  . Essential hypertension 01/13/2013  . 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 ARF (acute renal failure) with tubular necrosis requiring HD 01/11/2013  . Anemia 01/11/2013  . Thrombocytopenia, unspecified ([place]) 01/11/2013  . 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[place]) 2014   post op   . ARF (acute respiratory failure) ([place]) 2014  . Blood dyscrasia   . Chronic kidney disease 2014   ARF   . Complication of anesthesia 2014   - post op - in combination of pain pills-  Cardiac and Respiratory Arrest- prior to sleep apnea diagonosis  . Constipation   . CVA (cerebral infarction)   . Hyperlipidemia   . Hypertension   . OSA (obstructive sleep apnea)   . Sleep apnea   . Stress-induced cardiomyopathy -- essentially resolved    Post-op Narcotic induced Acute Hypoxic respiratory failure with respiratory and PEA cardiac arrest following  accidental narcotic overdose; initial [place] 20-20%, now improved to 55%   Was supported with Impella Cardiac Cath - non-obstructive CAD   . Thrombocytopenia ([place]) 2014   Past Surgical History:  Procedure Laterality Date  . ACHILLES TENDON REPAIR    . CARDIAC CATHETERIZATION  12/29/2012   RN LHC --> [facility]. for severe shock with low EF. Mild to moderate RV pressure elevation. Normal coronary arteries. Cardiac Output 2.5, Index 1.46.  Marland Kitchen COLONOSCOPY WITH PROPOFOL N/A 10/30/2015   Procedure: COLONOSCOPY WITH PROPOFOL;  Surgeon: Mauri Pole, MD;  Location: [facility];  Service: Endoscopy;  Laterality: N/A;  . INSERTION OF DIALYSIS CATHETER Right 01/09/2013   Procedure: INSERTION OF DIALYSIS CATHETER;  Surgeon: Angelia Mould, MD;  Location: [facility];  Service: Vascular;  Laterality: Right;  . LEFT AND RIGHT  HEART CATHETERIZATION WITH CORONARY ANGIOGRAM  12/29/2012   Procedure: LEFT AND RIGHT HEART CATHETERIZATION WITH CORONARY ANGIOGRAM;  Surgeon: Sherren Mocha, MD;  Location: [facility] CATH LAB;  Service: Cardiovascular;;  . TRANSTHORACIC ECHOCARDIOGRAM  12/29/2012   EF 25% with periapical HK/AKA.  --> Followup echo 7/25 -- EF of 35%  . TRANSTHORACIC ECHOCARDIOGRAM  01/2013 ; 07/2015   a) EF 55-60%. No regional WMA. Grade 1 diastolic dysfunction. Mild LA dilatation.; b) Normal LV size with mild LV hypertrophy. EF 55-60%. Normal RV   Allergies  Allergen Reactions  . Oxycodone     Oxygen levels drop   Prior to Admission medications   Medication Sig Start Date End Date Taking? Authorizing Provider  aspirin EC 81 MG tablet Take 1 tablet (81 mg total) by mouth daily. 05/13/17  Yes Almyra Deforest, PA  atorvastatin (LIPITOR) 20 MG tablet Take 1 tablet (20 mg total) by mouth daily. 10/14/16 10/21/17 Yes Leonie Man, MD  topiramate (TOPAMAX) 25 MG tablet Take one tablet at night for one week, then take 2 tablets at night for one week, then take 3 tablets at night. Patient not taking: Reported on 10/21/2017  05/05/17   Kathrynn Ducking, MD  traMADol (ULTRAM) 50 MG tablet Take 1 tablet (50 mg total) by mouth every 6 (six) hours as needed. Patient not taking: Reported on 10/21/2017 05/05/17   Kathrynn Ducking, MD   Social History   Socioeconomic History  . Marital status: Married    Spouse name: Not on file  . Number of children: Not on file  . Years of education: Not on file  . Highest education level: Not on file  Occupational History  . Not on file  Social Needs  . Financial resource strain: Not on file  . Food insecurity:    Worry: Not on file    Inability: Not on file  . Transportation needs:    Medical: Not on file    Non-medical: Not on file  Tobacco Use  . Smoking status: Former Smoker    Last attempt to quit: 06/20/1986    Years since quitting: 31.3  . Smokeless tobacco: Never Used  Substance and Sexual Activity  . Alcohol use: Yes    Comment: History of heavy use but only social at present.  10/29/15 - "a beer every week or so"  . Drug use: No    Comment: Prior cocaine use but none since 2000  . Sexual activity: Never  Lifestyle  . Physical activity:    Days per week: Not on file    Minutes per session: Not on file  . Stress: Not on file  Relationships  . Social connections:    Talks on phone: Not on file    Gets together: Not on file    Attends religious service: Not on file    Active member of club or organization: Not on file    Attends meetings of clubs or organizations: Not on file    Relationship status: Not on file  . Intimate partner violence:    Fear of current or ex partner: Not on file    Emotionally abused: Not on file    Physically abused: Not on file    Forced sexual activity: Not on file  Other Topics Concern  . Not on file  Social History Narrative   He is widowed for 13 years, and has 2 daughters: [name] & [address] [name], and one son Seferino Oscar. the  other history is daughter who is here with him  today.    He works for [employer] as a [employer].   He quit smoking in 1988. He does drink alcohol socially, but he does have a history of heavy use in the past. He also has a prior history of cocaine use but none since 2000.   Review of Systems  Constitutional: Positive for fatigue.  Eyes: Positive for itching.  Respiratory: Negative for shortness of breath.   Cardiovascular: Negative for chest pain.  Genitourinary: Positive for dysuria (occasional), frequency and urgency (occasional). Negative for discharge and penile pain.  Musculoskeletal: Positive for back pain, myalgias and neck pain.  Allergic/Immunologic: Positive for environmental allergies.  Neurological: Positive for numbness (L leg). Negative for dizziness (resolved) and headaches (resolved).      Objective:   Physical Exam  Constitutional: He is oriented to person, place, and time. He appears well-developed and well-nourished.  HENT:  Head: Normocephalic and atraumatic.  Right Ear: External ear normal. A middle ear effusion (trace) is present.  Left Ear: External ear normal. A middle ear effusion (trace) is present.  Mouth/Throat: Oropharynx is clear and moist.  Ears: canals otherwise clear  Eyes: Pupils are equal, round, and reactive to light. Conjunctivae and EOM are normal.  Neck: Normal range of motion. Neck supple. No thyromegaly present.  Cardiovascular: Normal rate, regular rhythm, normal heart sounds and intact distal pulses.  Pulmonary/Chest: Effort normal and breath sounds normal. No respiratory distress. He has no wheezes.  Abdominal: Soft. He exhibits no distension. There is no tenderness. Hernia confirmed negative in the right inguinal area and confirmed negative in the left inguinal area.  Genitourinary: Prostate normal.  Musculoskeletal: Normal range of motion. He exhibits edema. He exhibits no tenderness.  Lymphadenopathy:    He has no cervical adenopathy.  Neurological: He is alert and oriented  to person, place, and time. He has normal reflexes.  Skin: Skin is warm and dry.  Trace to 1+ pedal edema at lower third of tibia  Psychiatric: He has a normal mood and affect. His behavior is normal.  Vitals reviewed.  Vitals:   10/21/17 1525  BP: 120/62  Pulse: 95  Temp: 99.3 F (37.4 C)  TempSrc: Oral  SpO2: 95%  Weight: 258 lb 12.8 oz (117.4 kg)  Height: 5' 6.5" (1.689 m)      Assessment & Plan:    Nicholas Potts is a 57 y.o. male Annual physical exam  - -anticipatory guidance as below in AVS, screening labs above. Health maintenance items as above in HPI discussed/recommended as applicable.   History of cardiomyopathy Fatigue, unspecified type - Plan: Ambulatory referral to Sleep Studies, CBC, TSH, Comprehensive metabolic panel OSA on CPAP - Plan: Ambulatory referral to Sleep Studies  -Fatigue may be multifactorial, will refer to sleep specialist to check on status of his CPAP to make sure he is on effective treatment, also recommended calling his cardiologist as previous note indicated potential repeat echo with history of cardiomyopathy and persistent fatigue.  Recent blood work was reassuring, but will repeat CBC, TSH, CMP.  ER/RTC precautions if acute worsening  Need for Tdap vaccination - Plan: Tdap vaccine greater than or equal to 7yo IM  Dysuria - Plan: POCT urinalysis dipstick, POCT Microscopic Urinalysis ([facility]), PSA Urgency of urination - Plan: POCT urinalysis dipstick, POCT Microscopic Urinalysis ([facility]), PSA  -Reassuring urinalysis in office, check PSA, consider urology evaluation if persistent  -We discussed pros and cons of prostate cancer  screening, and after this discussion, he chose to have screening done. PSA obtained, and no concerning findings on DRE.   Screening, anemia, deficiency, iron - Plan: CBC  -As above screening for fatigue.  Hyperlipidemia, unspecified hyperlipidemia type - Plan: Lipid panel  Need for shingles vaccine - Plan: Zoster  Vaccine Adjuvanted [facility]) injection sent to pharmacy.    Meds ordered this encounter  Medications  . Zoster Vaccine Adjuvanted [facility]) injection    Sig: Inject 0.5 mLs into the muscle once for 1 dose. Repeat in 2-6 months.    Dispense:  0.5 mL    Refill:  1   Patient Instructions    Please return to discuss back and neck pain further, and we can discuss previous treatment and determine if imaging is needed at that time.   If blood sugar elevated or cholesterol is high, we can repeat after fasting for 8 hours.   If headaches return - follow up with neurologist.   I will check some blood work for fatigue, but would recommend following up with cardiology as they discussed possible repeat echocardiogram. I will also refer you for repeat evaluation with Dr. Annamaria Boots - sleep specialist - to determine if repeat sleep study is needed.   I will check urine tests, but if those are normal I would recommend meeting with urologist.    Keeping you healthy  Get these tests  Blood pressure- Have your blood pressure checked once a year by your healthcare provider.  Normal blood pressure is 120/80  Weight- Have your body mass index (BMI) calculated to screen for obesity.  BMI is a measure of body fat based on height and weight. You can also calculate your own BMI at ViewBanking.si.  Cholesterol- Have your cholesterol checked every year.  Diabetes- Have your blood sugar checked regularly if you have high blood pressure, high cholesterol, have a family history of diabetes or if you are overweight.  Screening for Colon Cancer- Colonoscopy starting at age 17.  Screening may begin sooner depending on your family history and other health conditions. Follow up colonoscopy as directed by your Gastroenterologist.  Screening for Prostate Cancer- Both blood work (PSA) and a rectal exam help screen for Prostate Cancer.  Screening begins at age 35 with African-American men and at age 54 with  Caucasian men.  Screening may begin sooner depending on your family history.  Take these medicines  Aspirin- One aspirin daily can help prevent Heart disease and Stroke - but discuss with cardiologist  Flu shot- Every fall.  Tetanus- Every 10 years.  Shingles vaccine sent to your pharmacy.   Pneumonia shot- Once after the age of 27; if you are younger than 8, ask your healthcare provider if you need a Pneumonia shot.  Take these steps  Don't smoke- If you do smoke, talk to your doctor about quitting.  For tips on how to quit, go to www.smokefree.gov or call 1-800-QUIT-NOW.  Be physically active- Exercise 5 days a week for at least 30 minutes.  If you are not already physically active start slow and gradually work up to 30 minutes of moderate physical activity.  Examples of moderate activity include walking briskly, mowing the yard, dancing, swimming, bicycling, etc.  Eat a healthy diet- Eat a variety of healthy food such as fruits, vegetables, low fat milk, low fat cheese, yogurt, lean meant, poultry, fish, beans, tofu, etc. For more information go to www.thenutritionsource.org  Drink alcohol in moderation- Limit alcohol intake to less than two drinks a  day. Never drink and drive.  Dentist- Brush and floss twice daily; visit your dentist twice a year.  Depression- Your emotional health is as important as your physical health. If you're feeling down, or losing interest in things you would normally enjoy please talk to your healthcare provider.  Eye exam- Visit your eye doctor every year.  Safe sex- If you may be exposed to a sexually transmitted infection, use a condom.  Seat belts- Seat belts can save your life; always wear one.  Smoke/Carbon Monoxide detectors- These detectors need to be installed on the appropriate level of your home.  Replace batteries at least once a year.  Skin cancer- When out in the sun, cover up and use sunscreen 15 SPF or higher.  Violence- If anyone  is threatening you, please tell your healthcare provider.  Living Will/ Health care power of attorney- Speak with your healthcare provider and family. Fatiga (Fatigue) La fatiga es una sensacin de cansancio en todo momento, falta de energa o falta de motivacin. La fatiga ocasional o leve con frecuencia es una reaccin normal a la actividad o la vida en general. Sin embargo, la fatiga de Engineer, site duracin (crnica) o extrema puede indicar una enfermedad preexistente. INSTRUCCIONES PARA EL CUIDADO EN EL HOGAR Controle su fatiga para ver si hay cambios. Las siguientes indicaciones ayudarn a Writer Ryder System pueda sentir:  Hable con el mdico acerca de cunto debe dormir cada noche. Trate de dormir la cantidad de tiempo requerida todas las noches.  Tome los medicamentos solamente como se lo haya indicado el mdico.  Siga una dieta saludable y nutritiva. Pida ayuda al mdico si necesita hacer cambios en su dieta.  Beba suficiente lquido para Consulting civil engineer orina clara o de color amarillo plido.  Practique actividades que lo relajen, como yoga, meditacin, terapia de [place] o acupuntura.  Haga ejercicios regularmente.  Cambie las situaciones que le provocan estrs. Trate de que su [place] y personal sea moderada.  No consuma drogas.  Limite el consumo de alcohol a no ms de 1 medida por da si es mujer y no est Music therapist, y 2 medidas si es hombre. Una medida equivale a 12onzas de cerveza, 5onzas de vino o 1onzas de bebidas alcohlicas de alta graduacin.  Tome una multivitamina, si se lo indic el mdico. SOLICITE ATENCIN MDICA SI:  La fatiga no mejora.  Tiene fiebre.  Tiene prdida o aumento involuntario de [place].  Tiene dolores de [place].  Tiene dificultad para: ? Dormirse. ? Dormir durante toda la noche.  Se siente enojado, culpable, ansioso o triste.  No puede defecar (estreimiento).  Tiene la piel seca.  Tiene hinchadas las piernas u  otra parte del cuerpo. SOLICITE ATENCIN MDICA DE INMEDIATO SI:  Se siente confundido.  Tiene visin borrosa.  Sufre mareos o se desmaya.  Sufre un dolor intenso de [place].  Siente dolor intenso en el abdomen, la pelvis o la espalda.  Tiene dolor de pecho, dificultad para respirar, o latidos cardacos irregulares o acelerados.  No puede orinar u [employer] de lo normal.  Presenta sangrado anormal, como sangrado del recto, la vagina, la nariz, los pulmones o los pezones.  Vomita sangre.  Tiene pensamientos acerca de hacerse dao a s mismo o cometer suicidio.  Le preocupa la posibilidad de hacerle dao a otra persona. Esta informacin no tiene Marine scientist el consejo del mdico. Asegrese de hacerle al mdico cualquier pregunta que tenga. Document Released: 09/10/2008 Document Revised: 09/16/2015 Document Reviewed: 09/26/2013  Elsevier Interactive Patient Education  [employer].    IF you received an x-ray today, you will receive an invoice from [facility]. Please contact [facility] at [phone] with questions or concerns regarding your invoice.   IF you received labwork today, you will receive an invoice from [place]. Please contact [facility] at [phone] with questions or concerns regarding your invoice.   Our billing staff will not be able to assist you with questions regarding bills from these companies.  You will be contacted with the lab results as soon as they are available. The fastest way to get your results is to activate your My Chart account. Instructions are located on the last page of this paperwork. If you have not heard from [place] regarding the results in 2 weeks, please contact this office.       I personally performed the services described in this documentation, which was scribed in my presence. The recorded information has been reviewed and considered for accuracy and completeness, addended by me as needed, and agree  with information above.  Signed,   Merri Ray, MD Primary [facility] at [place].  10/23/17 2:41 PM

## 2017-10-22 LAB — COMPREHENSIVE METABOLIC PANEL
ALT: 28 IU/L (ref 0–44)
AST: 24 IU/L (ref 0–40)
Albumin/Globulin Ratio: 1.7 (ref 1.2–2.2)
Albumin: 4.2 g/dL (ref 3.5–5.5)
Alkaline Phosphatase: 122 IU/L — ABNORMAL HIGH (ref 39–117)
BUN/Creatinine Ratio: 13 (ref 9–20)
BUN: 17 mg/dL (ref 6–24)
Bilirubin Total: <0.2 mg/dL (ref 0.0–1.2)
CALCIUM: 9.1 mg/dL (ref 8.7–10.2)
CO2: 21 mmol/L (ref 20–29)
CREATININE: 1.36 mg/dL — AB (ref 0.76–1.27)
Chloride: 110 mmol/L — ABNORMAL HIGH (ref 96–106)
GFR, EST AFRICAN AMERICAN: 66 mL/min/{1.73_m2} (ref >59)
GFR, EST NON AFRICAN AMERICAN: 57 mL/min/{1.73_m2} — AB (ref >59)
GLUCOSE: 111 mg/dL — AB (ref 65–99)
Globulin, Total: 2.5 g/dL (ref 1.5–4.5)
Potassium: 4.2 mmol/L (ref 3.5–5.2)
Sodium: 146 mmol/L — ABNORMAL HIGH (ref 134–144)
TOTAL PROTEIN: 6.7 g/dL (ref 6.0–8.5)

## 2017-10-22 LAB — LIPID PANEL
Chol/HDL Ratio: 4.8 ratio (ref 0.0–5.0)
Cholesterol, Total: 158 mg/dL (ref 100–199)
HDL: 33 mg/dL — ABNORMAL LOW (ref >39)
LDL Calculated: 48 mg/dL (ref 0–99)
Triglycerides: 383 mg/dL — ABNORMAL HIGH (ref 0–149)
VLDL Cholesterol Cal: 77 mg/dL — ABNORMAL HIGH (ref 5–40)

## 2017-10-22 LAB — CBC
HEMOGLOBIN: 14.3 g/dL (ref 13.0–17.7)
Hematocrit: 42.7 % (ref 37.5–51.0)
MCH: 32.4 pg (ref 26.6–33.0)
MCHC: 33.5 g/dL (ref 31.5–35.7)
MCV: 97 fL (ref 79–97)
Platelets: 279 10*3/uL (ref 150–379)
RBC: 4.41 x10E6/uL (ref 4.14–5.80)
RDW: 13.3 % (ref 12.3–15.4)
WBC: 9.3 10*3/uL (ref 3.4–10.8)

## 2017-10-22 LAB — TSH: TSH: 2.38 u[IU]/mL (ref 0.450–4.500)

## 2017-10-22 LAB — PSA: Prostate Specific Ag, Serum: 0.7 ng/mL (ref 0.0–4.0)

## 2017-10-28 ENCOUNTER — Ambulatory Visit: Payer: BLUE CROSS/BLUE SHIELD | Admitting: Family Medicine

## 2017-11-01 ENCOUNTER — Other Ambulatory Visit: Payer: Self-pay | Admitting: Family Medicine

## 2017-11-01 DIAGNOSIS — R7989 Other specified abnormal findings of blood chemistry: Secondary | ICD-10-CM

## 2017-11-01 DIAGNOSIS — E782 Mixed hyperlipidemia: Secondary | ICD-10-CM

## 2017-11-01 DIAGNOSIS — R748 Abnormal levels of other serum enzymes: Secondary | ICD-10-CM

## 2017-11-01 NOTE — Progress Notes (Signed)
See lab note. Plan for fasting lab only visit.

## 2017-11-04 ENCOUNTER — Ambulatory Visit: Payer: BLUE CROSS/BLUE SHIELD | Admitting: Family Medicine

## 2017-11-04 ENCOUNTER — Other Ambulatory Visit: Payer: Self-pay

## 2017-11-04 ENCOUNTER — Encounter: Payer: Self-pay | Admitting: Family Medicine

## 2017-11-04 ENCOUNTER — Ambulatory Visit (INDEPENDENT_AMBULATORY_CARE_PROVIDER_SITE_OTHER): Payer: BLUE CROSS/BLUE SHIELD

## 2017-11-04 VITALS — BP 102/58 | HR 95 | Temp 98.2°F | Ht 66.5 in | Wt 257.4 lb

## 2017-11-04 DIAGNOSIS — M546 Pain in thoracic spine: Secondary | ICD-10-CM | POA: Diagnosis not present

## 2017-11-04 DIAGNOSIS — M545 Low back pain, unspecified: Secondary | ICD-10-CM

## 2017-11-04 DIAGNOSIS — M549 Dorsalgia, unspecified: Secondary | ICD-10-CM

## 2017-11-04 DIAGNOSIS — R5383 Other fatigue: Secondary | ICD-10-CM

## 2017-11-04 MED ORDER — CYCLOBENZAPRINE HCL 5 MG PO TABS: 5.0000 mg | ORAL_TABLET | Freq: Every evening | ORAL | 1 refills | Status: DC | PRN

## 2017-11-04 NOTE — Patient Instructions (Addendum)
  Tylenol over-the-counter if needed for back pain.  Muscle relaxant at bedtime if needed, be careful with the side effects as we discussed.  Stretches/range of motion after work and during workday as needed.  I did refer you to physical therapy.  If there are any concerns on your x-ray I will let you know.  Follow-up in 3 to 4 weeks to check back pain, sooner if worse.  For fatigue, I did refer you to sleep specialist.  I would recommend contacting your cardiologist to determine if repeat echocardiogram needed based on their previous notes.  Recent blood work was overall reassuring, but return for fasting blood work so we can recheck your cholesterol and other electrolytes that were slightly abnormal on previous testing.  Please follow-up to discuss fatigue further if that continues.Return to the clinic or go to the nearest emergency room if any of your symptoms worsen or new symptoms occur.    IF you received an x-ray today, you will receive an invoice from [facility]. Please contact [facility] at [phone] with questions or concerns regarding your invoice.   IF you received labwork today, you will receive an invoice from [place]. Please contact [facility] at [phone] with questions or concerns regarding your invoice.   Our billing staff will not be able to assist you with questions regarding bills from these companies.  You will be contacted with the lab results as soon as they are available. The fastest way to get your results is to activate your My Chart account. Instructions are located on the last page of this paperwork. If you have not heard from [place] regarding the results in 2 weeks, please contact this office.

## 2017-11-04 NOTE — Progress Notes (Signed)
Subjective:  By signing my name below, I, [name], attest that this documentation has been prepared under the direction and in the presence of [name], MD Electronically Signed: Ladene Artist, ED Scribe 11/04/2017 at 4:30 PM.   Patient ID: Nicholas Potts, male    DOB: July 30, 1960, 57 y.o.   MRN: [id number]  Chief Complaint  Patient presents with  . Back Pain    follow up   HPI Nicholas Potts is a 57 y.o. male who presents to Primary Care at [facility] for f/u on back pain. Recently seen for CPE 2 wks ago with other concerns addressed. He reported neck and back pain x 1 yr at that time, addressed by chiropractor. - Pt's daughter is present to translate. Pt reports 3 yr h/o low back pain, but notes daily upper back pain between his shoulder blades x 6 months. No known injury/fall. States pain is worse after work; pt makes furniture for a living. He wears a back brace while working which helps some, takes Advil 2-3 times/wk up to every 6 hrs with minimal improvement and has seen a chiropractor 6-8 times without significant relief. Denies fever, unexplained weight loss, night sweats, bladder/bowel incontinence, saddle anesthesia, numbness in LEs. Pt states that he did have lower back pain after rehab in 2014 that improved some with core strengthening.  Fatigue At the end of visit pt mentioned fatigue. We did discuss this at the last visit; may be multifactorial. Referred pt to sleep specialist to make sure he is on effective treatment with CPAP. Also discussed calling his cardiologist to discuss possible rpt echo.  Patient Active Problem List   Diagnosis Date Noted  . Other headache syndrome 05/05/2017  . Vertigo 05/05/2017  . IFG (impaired fasting glucose) 08/21/2016  . Obesity (BMI 30-39.9) 06/23/2013  . Stress-induced cardiomyopathy -- essentially resolved   . CVA (cerebral infarction)   . OSA (obstructive sleep apnea)   . Hyperlipidemia   . Physical deconditioning  01/24/2013  . OSA on CPAP 01/19/2013  . Tinea pedis 01/18/2013  . Essential hypertension 01/13/2013  . 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 ARF (acute renal failure) with tubular necrosis requiring HD 01/11/2013  . Anemia 01/11/2013  . Thrombocytopenia, unspecified ([place]) 01/11/2013  . 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[place]) 2014   post op   . ARF (acute respiratory failure) ([place]) 2014  . Blood dyscrasia   . Chronic kidney disease 2014   ARF   . Complication of anesthesia 2014   - post op - in combination of pain pills-  Cardiac and Respiratory Arrest- prior to sleep apnea diagonosis  . Constipation   . CVA (cerebral infarction)   . Hyperlipidemia   . Hypertension   . OSA (obstructive sleep apnea)   . Sleep apnea   . Stress-induced cardiomyopathy -- essentially resolved    Post-op Narcotic induced Acute Hypoxic respiratory failure with respiratory and PEA cardiac arrest following accidental narcotic overdose; initial [place] 20-20%, now improved to 55%   Was supported with Impella Cardiac Cath - non-obstructive CAD   . Thrombocytopenia ([place]) 2014   Past Surgical History:  Procedure Laterality Date  . ACHILLES TENDON REPAIR    . CARDIAC CATHETERIZATION  12/29/2012   RN LHC --> Impella. for severe shock with low EF. Mild to moderate RV pressure elevation. Normal coronary arteries. Cardiac Output 2.5,  Index 1.46.  Marland Kitchen COLONOSCOPY WITH PROPOFOL N/A 10/30/2015   Procedure: COLONOSCOPY WITH PROPOFOL;  Surgeon: Mauri Pole, MD;  Location: [facility] [facility];  Service: Endoscopy;  Laterality: N/A;  . INSERTION OF DIALYSIS CATHETER Right 01/09/2013   Procedure: INSERTION OF DIALYSIS CATHETER;  Surgeon: Angelia Mould, MD;  Location: [facility];  Service: Vascular;  Laterality: Right;  . LEFT AND RIGHT HEART CATHETERIZATION WITH CORONARY ANGIOGRAM   12/29/2012   Procedure: LEFT AND RIGHT HEART CATHETERIZATION WITH CORONARY ANGIOGRAM;  Surgeon: Sherren Mocha, MD;  Location: [facility] CATH LAB;  Service: Cardiovascular;;  . TRANSTHORACIC ECHOCARDIOGRAM  12/29/2012   EF 25% with periapical HK/AKA.  --> Followup echo 7/25 -- EF of 35%  . TRANSTHORACIC ECHOCARDIOGRAM  01/2013 ; 07/2015   a) EF 55-60%. No regional WMA. Grade 1 diastolic dysfunction. Mild LA dilatation.; b) Normal LV size with mild LV hypertrophy. EF 55-60%. Normal RV   Allergies  Allergen Reactions  . Oxycodone     Oxygen levels drop   Prior to Admission medications   Medication Sig Start Date End Date Taking? Authorizing Provider  aspirin EC 81 MG tablet Take 1 tablet (81 mg total) by mouth daily. 05/13/17   Almyra Deforest, PA  atorvastatin (LIPITOR) 20 MG tablet Take 1 tablet (20 mg total) by mouth daily. 10/14/16 10/21/17  Leonie Man, MD   Social History   Socioeconomic History  . Marital status: Married    Spouse name: Not on file  . Number of children: Not on file  . Years of education: Not on file  . Highest education level: Not on file  Occupational History  . Not on file  Social Needs  . Financial resource strain: Not on file  . Food insecurity:    Worry: Not on file    Inability: Not on file  . Transportation needs:    Medical: Not on file    Non-medical: Not on file  Tobacco Use  . Smoking status: Former Smoker    Last attempt to quit: 06/20/1986    Years since quitting: 31.3  . Smokeless tobacco: Never Used  Substance and Sexual Activity  . Alcohol use: Yes    Comment: History of heavy use but only social at present.  10/29/15 - "a beer every week or so"  . Drug use: No    Comment: Prior cocaine use but none since 2000  . Sexual activity: Never  Lifestyle  . Physical activity:    Days per week: Not on file    Minutes per session: Not on file  . Stress: Not on file  Relationships  . Social connections:    Talks on phone: Not on file    Gets together:  Not on file    Attends religious service: Not on file    Active member of club or organization: Not on file    Attends meetings of clubs or organizations: Not on file    Relationship status: Not on file  . Intimate partner violence:    Fear of current or ex partner: Not on file    Emotionally abused: Not on file    Physically abused: Not on file    Forced sexual activity: Not on file  Other Topics Concern  . Not on file  Social History Narrative   He is widowed for 13 years, and has 2 daughters: [name] & [address] [name], and one son Mikel Hardgrove. the other history is daughter who is here  with him today.    He works for [employer] and [employer] as a [employer].   He quit smoking in 1988. He does drink alcohol socially, but he does have a history of heavy use in the past. He also has a prior history of cocaine use but none since 2000.   Review of Systems  Constitutional: Positive for fatigue. Negative for diaphoresis, fever and unexpected weight change.  Genitourinary: Negative for enuresis.  Musculoskeletal: Positive for back pain.  Neurological: Negative for weakness and numbness.      Objective:   Physical Exam  Constitutional: He is oriented to person, place, and time. He appears well-developed and well-nourished. No distress.  HENT:  Head: Normocephalic and atraumatic.  Eyes: Conjunctivae and EOM are normal.  Neck: Neck supple. No tracheal deviation present.  Cardiovascular: Normal rate.  Pulmonary/Chest: Effort normal. No respiratory distress.  Musculoskeletal: Normal range of motion.  Localizes pain to upper trapezius and rhomboid area bilaterally. Minimal at lower lumbar spina area. Minimal discomfort to midline primarily into paraspinals. Slight spasms, R greater than L of trapezius. Neg seated straight leg raise. Flexion to 90 degrees. Able to heel and toe walk without difficulty.  Neurological: He is alert and oriented to person, place,  and time.  Skin: Skin is warm and dry.  Psychiatric: He has a normal mood and affect. His behavior is normal.  Nursing note and vitals reviewed.  Vitals:   11/04/17 1623  BP: (!) 102/58  Pulse: 95  Temp: 98.2 F (36.8 C)  TempSrc: Oral  SpO2: 97%  Weight: 257 lb 6.4 oz (116.8 kg)  Height: 5' 6.5" (1.689 m)      Assessment & Plan:   Nicholas Potts is a 57 y.o. male Bilateral low back pain without sciatica, unspecified chronicity - Plan: DG Lumbar Spine 2-3 Views, cyclobenzaprine (FLEXERIL) 5 MG tablet, Ambulatory referral to Physical Therapy Upper back pain - Plan: DG Thoracic Spine 2 View, cyclobenzaprine (FLEXERIL) 5 MG tablet, Ambulatory referral to Physical Therapy  -Suspected spasm/muscular cause of both upper and lower back pain.  Based on his type of work with repetitive activity, may have some component of overuse.  This is also complicated by relative deconditioning/weight as mentioned previously by cardiology.  Unlikely disc/midline cause.  Will check x-ray of lumbar and thoracic spine, but refer to physical therapy, episodic Flexeril if needed with potential side effects discussed, and Tylenol if needed.  Recheck in 3 to 4 weeks.  Briefly discussed fatigue at end of visit.  This was discussed at last visit as well.  He was referred to sleep specialist, but also encouraged him to follow-up with cardiology to see if repeat echocardiogram is indicated.  Recent blood work was reviewed, does plan on returning for fasting blood work to recheck borderline electrolytes and hyper lipidemia.  RTC precautions if persistent/worsening symptoms.  Meds ordered this encounter  Medications  . cyclobenzaprine (FLEXERIL) 5 MG tablet    Sig: Take 1 tablet (5 mg total) by mouth at bedtime as needed for muscle spasms (start qhs prn due to sedation).    Dispense:  15 tablet    Refill:  1   Patient Instructions    Tylenol over-the-counter if needed for back pain.  Muscle relaxant at  bedtime if needed, be careful with the side effects as we discussed.  Stretches/range of motion after work and during workday as needed.  I did refer you to physical therapy.  If there are any concerns on your x-ray I  will let you know.  Follow-up in 3 to 4 weeks to check back pain, sooner if worse.  For fatigue, I did refer you to sleep specialist.  I would recommend contacting your cardiologist to determine if repeat echocardiogram needed based on their previous notes.  Recent blood work was overall reassuring, but return for fasting blood work so we can recheck your cholesterol and other electrolytes that were slightly abnormal on previous testing.  Please follow-up to discuss fatigue further if that continues.Return to the clinic or go to the nearest emergency room if any of your symptoms worsen or new symptoms occur.    IF you received an x-ray today, you will receive an invoice from [facility]. Please contact [facility] at [phone] with questions or concerns regarding your invoice.   IF you received labwork today, you will receive an invoice from [facility]. Please contact [facility] at [phone] with questions or concerns regarding your invoice.   Our billing staff will not be able to assist you with questions regarding bills from these companies.  You will be contacted with the lab results as soon as they are available. The fastest way to get your results is to activate your My Chart account. Instructions are located on the last page of this paperwork. If you have not heard from [place] regarding the results in 2 weeks, please contact this office.       I personally performed the services described in this documentation, which was scribed in my presence. The recorded information has been reviewed and considered for accuracy and completeness, addended by me as needed, and agree with information above.  Signed,   Merri Ray, MD Primary Care at [facility].  11/04/17 4:59 PM

## 2017-11-19 DIAGNOSIS — G4733 Obstructive sleep apnea (adult) (pediatric): Secondary | ICD-10-CM | POA: Diagnosis not present

## 2017-11-24 ENCOUNTER — Encounter: Payer: Self-pay | Admitting: Internal Medicine

## 2017-11-25 ENCOUNTER — Ambulatory Visit (INDEPENDENT_AMBULATORY_CARE_PROVIDER_SITE_OTHER): Payer: BLUE CROSS/BLUE SHIELD | Admitting: Internal Medicine

## 2017-11-25 ENCOUNTER — Encounter: Payer: Self-pay | Admitting: Internal Medicine

## 2017-11-25 VITALS — BP 122/78 | HR 72 | Ht 67.0 in | Wt 257.8 lb

## 2017-11-25 DIAGNOSIS — G4710 Hypersomnia, unspecified: Secondary | ICD-10-CM | POA: Diagnosis not present

## 2017-11-25 DIAGNOSIS — G4733 Obstructive sleep apnea (adult) (pediatric): Secondary | ICD-10-CM

## 2017-11-25 MED ORDER — AMPHETAMINE-DEXTROAMPHETAMINE 10 MG PO TABS: ORAL_TABLET | ORAL | 0 refills | Status: DC

## 2017-11-25 NOTE — Progress Notes (Signed)
11/25/2017-57 year old male former smoker, widower,  for sleep evaluation. -=----On cpap for 5 years  now. Had sleep study done with [facility].Feels he does not have enough pressure. DME is [facility]. NPSG 02/20/13  AHI 79.7/ hr, body weight 240 lbs CPAP auto 5-20/ [facility] Today's weight 257 lbs Medical problem list includes HBP, history of acute respiratory failure after accidental narcotic overdose, obesity, MI/CHF. Download 100% compliance, AHI 0.8/ hr, auto 5-20. Daughter here as Optometrist.  He works day shift job Education officer, community.  Asleep at TV after work and dozes off easily if sitting quietly but he thinks he sleeps without interruption at night except nocturia x2 or 3 short return to sleep.  He is concerned that he feels sleepier during the daytime than he should, but he has difficulty distinguishing between sleepy and weak. Epworth score 4 Remote hx cocaine use. Little caffeine now.   Prior to Admission medications   Medication Sig Start Date End Date Taking? Authorizing Provider  aspirin EC 81 MG tablet Take 1 tablet (81 mg total) by mouth daily. 05/13/17  Yes Meng, Isaac Laud, PA  amphetamine-dextroamphetamine (ADDERALL) 10 MG tablet 1-3 tabs daily as needed for sleepiness 11/25/17   Baird Lyons D, MD  atorvastatin (LIPITOR) 20 MG tablet Take 1 tablet (20 mg total) by mouth daily. 10/14/16 11/04/17  Leonie Man, MD   Past Medical History:  Diagnosis Date  . 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[place]) 2014   post op   . ARF (acute respiratory failure) ([place]) 2014  . Blood dyscrasia   . Chronic kidney disease 2014   ARF   . Complication of anesthesia 2014   - post op - in combination of pain pills-  Cardiac and Respiratory Arrest- prior to sleep apnea diagonosis  . Constipation   . CVA (cerebral infarction)   . Hyperlipidemia   . Hypertension   . OSA (obstructive sleep apnea)   . Sleep apnea   . Stress-induced cardiomyopathy -- essentially resolved    Post-op Narcotic induced Acute Hypoxic  respiratory failure with respiratory and PEA cardiac arrest following accidental narcotic overdose; initial [place] 20-20%, now improved to 55%   Was supported with Impella Cardiac Cath - non-obstructive CAD   . Thrombocytopenia ([place]) 2014   Past Surgical History:  Procedure Laterality Date  . ACHILLES TENDON REPAIR    . CARDIAC CATHETERIZATION  12/29/2012   RN LHC --> Impella. for severe shock with low EF. Mild to moderate RV pressure elevation. Normal coronary arteries. Cardiac Output 2.5, Index 1.46.  Marland Kitchen COLONOSCOPY WITH PROPOFOL N/A 10/30/2015   Procedure: COLONOSCOPY WITH PROPOFOL;  Surgeon: Mauri Pole, MD;  Location: [facility];  Service: Endoscopy;  Laterality: N/A;  . INSERTION OF DIALYSIS CATHETER Right 01/09/2013   Procedure: INSERTION OF DIALYSIS CATHETER;  Surgeon: Angelia Mould, MD;  Location: [facility];  Service: Vascular;  Laterality: Right;  . LEFT AND RIGHT HEART CATHETERIZATION WITH CORONARY ANGIOGRAM  12/29/2012   Procedure: LEFT AND RIGHT HEART CATHETERIZATION WITH CORONARY ANGIOGRAM;  Surgeon: Sherren Mocha, MD;  Location: [facility] CATH LAB;  Service: Cardiovascular;;  . TRANSTHORACIC ECHOCARDIOGRAM  12/29/2012   EF 25% with periapical HK/AKA.  --> Followup echo 7/25 -- EF of 35%  . TRANSTHORACIC ECHOCARDIOGRAM  01/2013 ; 07/2015   a) EF 55-60%. No regional WMA. Grade 1 diastolic dysfunction. Mild LA dilatation.; b) Normal LV size with mild LV hypertrophy. EF 55-60%. Normal RV   Family History  Problem Relation Age of Onset  . Cancer Mother  Liver cancer  . Cancer Father        Leukemia  . Cancer Maternal Grandmother        Liver cancer  . Cancer Paternal Grandmother        Colon cancer  . Colon cancer Neg Hx    Social History   Socioeconomic History  . Marital status: Married    Spouse name: Not on file  . Number of children: Not on file  . Years of education: Not on file  . Highest education level: Not on file  Occupational History  . Not on file   Social Needs  . Financial resource strain: Not on file  . Food insecurity:    Worry: Not on file    Inability: Not on file  . Transportation needs:    Medical: Not on file    Non-medical: Not on file  Tobacco Use  . Smoking status: Former Smoker    Last attempt to quit: 06/20/1986    Years since quitting: 31.4  . Smokeless tobacco: Never Used  Substance and Sexual Activity  . Alcohol use: Yes    Comment: History of heavy use but only social at present.  10/29/15 - "a beer every week or so"  . Drug use: No    Comment: Prior cocaine use but none since 2000  . Sexual activity: Never  Lifestyle  . Physical activity:    Days per week: Not on file    Minutes per session: Not on file  . Stress: Not on file  Relationships  . Social connections:    Talks on phone: Not on file    Gets together: Not on file    Attends religious service: Not on file    Active member of club or organization: Not on file    Attends meetings of clubs or organizations: Not on file    Relationship status: Not on file  . Intimate partner violence:    Fear of current or ex partner: Not on file    Emotionally abused: Not on file    Physically abused: Not on file    Forced sexual activity: Not on file  Other Topics Concern  . Not on file  Social History Narrative   He is widowed for 13 years, and has 2 daughters: [name] & [address] [name], and one son Caius Silbernagel. the other history is daughter who is here with him today.    He works for [employer] and [employer] as a [employer].   He quit smoking in 1988. He does drink alcohol socially, but he does have a history of heavy use in the past. He also has a prior history of cocaine use but none since 2000.  ROS-see HPI   Negative unless "+" Constitutional:    weight loss, night sweats, fevers, chills, fatigue, lassitude. HEENT:    headaches, difficulty swallowing, tooth/dental problems, sore throat,       sneezing, itching, ear  ache, nasal congestion, post nasal drip, snoring CV:    chest pain, orthopnea, PND, swelling in lower extremities, anasarca,                                  dizziness, palpitations Resp:   shortness of breath with exertion or at rest.                productive cough,   non-productive cough, coughing up  of blood.              change in color of mucus.  wheezing.   Skin:    rash or lesions. GI:  No-   heartburn, indigestion, abdominal pain, nausea, vomiting, diarrhea,                 change in bowel habits, loss of appetite GU: dysuria, change in color of urine, no urgency or frequency.   flank pain. MS:   joint pain, stiffness, decreased range of motion, back pain. Neuro-     nothing unusual Psych:  change in mood or affect.  depression or anxiety.   memory loss.  OBJ- Physical Exam General- Alert, Oriented, Affect-appropriate, Distress- none acute, + obese, yawning Skin- rash-none, lesions- none, excoriation- none Lymphadenopathy- none Head- atraumatic            Eyes- Gross vision intact, PERRLA, conjunctivae and secretions clear            Ears- Hearing, canals-normal            Nose- Clear, no-Septal dev, mucus, polyps, erosion, perforation             Throat- Mallampati IV , mucosa clear , drainage- none, tonsils- atrophic Neck- flexible , trachea midline, no stridor , thyroid nl, carotid no bruit Chest - symmetrical excursion , unlabored           Heart/CV- RRR , no murmur , no gallop  , no rub, nl s1 s2                           - JVD- none , edema- none, stasis changes- none, varices- none           Lung- clear to P&A, wheeze- none, cough- none , dullness-none, rub- none           Chest wall-  Abd-  Br/ Gen/ Rectal- Not done, not indicated Extrem- cyanosis- none, clubbing, none, atrophy- none, strength- nl Neuro- grossly intact to observation

## 2017-11-25 NOTE — Patient Instructions (Addendum)
Script printed for Adderall 10 mg,  Up to 3 daily if needed for sleepiness  Order- schedule onox on CPAP with room air     Dx OSA  We can continue CPAP auto 5-20, mask of choice, humidifier, supplies, AirView  Please call as needed

## 2017-11-29 DIAGNOSIS — G4710 Hypersomnia, unspecified: Secondary | ICD-10-CM | POA: Insufficient documentation

## 2017-11-29 NOTE — Assessment & Plan Note (Signed)
His daughter indicates he feels he benefits from CPAP but wonders why he still feels sleepy/tired/weak.  Download confirms excellent compliance and control. Plan-continue CPAP auto 5-20/ [facility]

## 2017-11-29 NOTE — Assessment & Plan Note (Signed)
He feels he benefits from CPAP and download indicates excellent compliance and control.  I am not convinced that he is understanding the distinction between sleepiness and fatigue, noting Epworth score only four.  However he is yawning.  We discussed his remote history of cocaine in respect to the importance of avoiding problems in that direction.  Best initial course seems to be a therapeutic trial of Adderall to see if that impacts the symptoms that are bothering him.  He may eventually require repeat sleep study with [facility].

## 2017-12-01 ENCOUNTER — Encounter: Payer: Self-pay | Admitting: Physical Therapy

## 2017-12-01 ENCOUNTER — Ambulatory Visit: Payer: BLUE CROSS/BLUE SHIELD | Attending: Family Medicine | Admitting: Physical Therapy

## 2017-12-01 ENCOUNTER — Other Ambulatory Visit: Payer: Self-pay

## 2017-12-01 DIAGNOSIS — M545 Low back pain: Secondary | ICD-10-CM | POA: Diagnosis not present

## 2017-12-01 DIAGNOSIS — G8929 Other chronic pain: Secondary | ICD-10-CM | POA: Diagnosis not present

## 2017-12-01 DIAGNOSIS — M62830 Muscle spasm of back: Secondary | ICD-10-CM

## 2017-12-01 DIAGNOSIS — R293 Abnormal posture: Secondary | ICD-10-CM | POA: Insufficient documentation

## 2017-12-02 ENCOUNTER — Encounter: Payer: Self-pay | Admitting: Physical Therapy

## 2017-12-02 ENCOUNTER — Ambulatory Visit: Payer: BLUE CROSS/BLUE SHIELD | Admitting: Family Medicine

## 2017-12-02 NOTE — Therapy (Signed)
[place], [place], [address] Phone: [phone]   Fax:  [phone]  Physical Therapy Evaluation  Patient Details  Name: Nicholas Potts MRN: [id number] Date of Birth: [birth year] Referring Provider: Dr Marijean Bravo    Encounter Date: 12/01/2017  PT End of Session - 12/01/17 1646    Visit Number  1    Number of Visits  16    Authorization Type  BCBS     PT Start Time  2993    PT Stop Time  1718    PT Time Calculation (min)  42 min    Activity Tolerance  Patient tolerated treatment well    Behavior During Therapy  [facility] for tasks assessed/performed       Past Medical History:  Diagnosis Date  . 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[place]) 2014   post op   . ARF (acute respiratory failure) ([place]) 2014  . Blood dyscrasia   . Chronic kidney disease 2014   ARF   . Complication of anesthesia 2014   - post op - in combination of pain pills-  Cardiac and Respiratory Arrest- prior to sleep apnea diagonosis  . Constipation   . CVA (cerebral infarction)   . Hyperlipidemia   . Hypertension   . OSA (obstructive sleep apnea)   . Sleep apnea   . Stress-induced cardiomyopathy -- essentially resolved    Post-op Narcotic induced Acute Hypoxic respiratory failure with respiratory and PEA cardiac arrest following accidental narcotic overdose; initial [place] 20-20%, now improved to 55%   Was supported with Impella Cardiac Cath - non-obstructive CAD   . Thrombocytopenia ([place]) 2014    Past Surgical History:  Procedure Laterality Date  . ACHILLES TENDON REPAIR    . CARDIAC CATHETERIZATION  12/29/2012   RN LHC --> Impella. for severe shock with low EF. Mild to moderate RV pressure elevation. Normal coronary arteries. Cardiac Output 2.5, Index 1.46.  Marland Kitchen COLONOSCOPY WITH PROPOFOL N/A 10/30/2015   Procedure: COLONOSCOPY WITH PROPOFOL;  Surgeon: Mauri Pole, MD;  Location: [place] ENDOSCOPY;  Service: Endoscopy;  Laterality: N/A;   . INSERTION OF DIALYSIS CATHETER Right 01/09/2013   Procedure: INSERTION OF DIALYSIS CATHETER;  Surgeon: Angelia Mould, MD;  Location: [facility];  Service: Vascular;  Laterality: Right;  . LEFT AND RIGHT HEART CATHETERIZATION WITH CORONARY ANGIOGRAM  12/29/2012   Procedure: LEFT AND RIGHT HEART CATHETERIZATION WITH CORONARY ANGIOGRAM;  Surgeon: Sherren Mocha, MD;  Location: [facility] CATH LAB;  Service: Cardiovascular;;  . TRANSTHORACIC ECHOCARDIOGRAM  12/29/2012   EF 25% with periapical HK/AKA.  --> Followup echo 7/25 -- EF of 35%  . TRANSTHORACIC ECHOCARDIOGRAM  01/2013 ; 07/2015   a) EF 55-60%. No regional WMA. Grade 1 diastolic dysfunction. Mild LA dilatation.; b) Normal LV size with mild LV hypertrophy. EF 55-60%. Normal RV    There were no vitals filed for this visit.   Subjective Assessment - 12/01/17 1641    Subjective  Patient reports a histroy of lower and upper back pain stating about three years ago. It started around the time he had a tripple heart attack. He began to have upper back pain and then it progressied to lower back pain.     How long can you sit comfortably?  limited community distances     How long can you stand comfortably?  depends on activity     Diagnostic tests  x-ray thoracic and lumbar: endplate spuring at multpile levels. Per x-ray may be suggestive of diffuse  idiopathic skeletal hyperstosis.     Currently in Pain?  Yes    Pain Score  7     Pain Location  Thoracic    Pain Orientation  Right    Pain Descriptors / Indicators  Aching    Aggravating Factors   Sitting still and straight     Pain Relieving Factors  leaning forward; laying down     Effect of Pain on Daily Activities  pain when sitting up     Multiple Pain Sites  Yes    Pain Score  8    Pain Location  Back    Pain Orientation  Right;Left    Pain Descriptors / Indicators  Aching    Pain Type  Chronic pain    Pain Onset  More than a month ago    Pain Frequency  Constant    Aggravating Factors    When he is working     Pain Relieving Factors  laying down    Effect of Pain on Daily Activities  difficulty performing work tasks.          [facility] PT Assessment - 12/02/17 0001      Assessment   Medical Diagnosis  Low back pain; Upper back pain     Referring Provider  Dr Marijean Bravo     Onset Date/Surgical Date  -- 3 years prior     Hand Dominance  Right    Next MD Visit  1 month     Prior Therapy  Chiropracter for 6-8 visits       Precautions   Precautions  None      Restrictions   Weight Bearing Restrictions  No      Balance Screen   Has the patient fallen in the past 6 months  No    Has the patient had a decrease in activity level because of a fear of falling?   No    Is the patient reluctant to leave their home because of a fear of falling?   No      Home Environment   Additional Comments  4 steps into the house but does not cause him back pain.       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     Leisure  exercising       Cognition   Overall Cognitive Status  Within Functional Limits for tasks assessed    Attention  Focused    Focused Attention  Appears intact    Memory  Appears intact    Awareness  Appears intact    Problem Solving  Appears intact      Observation/Other Assessments   Focus on Therapeutic Outcomes (FOTO)   61 % limitation       Sensation   Additional Comments  denies parathesias       Coordination   Gross Motor Movements are Fluid and Coordinated  Yes    Fine Motor Movements are Fluid and Coordinated  Yes      Posture/Postural Control   Posture Comments  rounded shoulders       AROM   Overall AROM Comments  Pain with thoracic extension     Lumbar Flexion  limited 25%     Lumbar Extension  significant pain limited 50%     Lumbar - Right Side Bend  Pain on the left  Lumbar - Left Side Bend  No pain     Lumbar - Right Rotation  Pain  on the left     Lumbar - Left Rotation  No significant pain       PROM   Left Hip Flexion  86      Strength   Right Hip Flexion  4+/5    Right Hip ABduction  4+/5    Right Hip ADduction  5/5    Left Hip Flexion  4+/5    Left Hip ABduction  4+/5    Left Hip ADduction  5/5      Right Hip   Right Hip Flexion  89      Flexibility   Soft Tissue Assessment /Muscle Length  yes      Palpation   Spinal mobility  decreased PA mobility from thoracic down to lumbar spine     Palpation comment  significant tenderness to palpation in the peri-scapual area and the cnter of his lower back form L3-L5       Ambulation/Gait   Gait Comments  decreased hip rotation bilateral                 Objective measurements completed on examination: See above findings.      [facility] Adult PT Treatment/Exercise - 12/02/17 0001      Lumbar Exercises: Stretches   Lower Trunk Rotation Limitations  x10       Lumbar Exercises: Standing   Other Standing Lumbar Exercises  tennis ball trigger point release to peri scapular exercises       Lumbar Exercises: Seated   Other Seated Lumbar Exercises  scap retractions       Lumbar Exercises: Supine   AB Set Limitations  core breathing 5 sec hold x10              PT Education - 12/02/17 0856    Education Details  reviewed HEP; posture, symptom magment    Person(s) Educated  Patient;Child(ren)    Methods  Explanation;Demonstration;Tactile cues;Verbal cues    Comprehension  Verbalized understanding;Returned demonstration;Verbal cues required;Tactile cues required       PT Short Term Goals - 12/02/17 0901      PT SHORT TERM GOAL #1   Title  Patient will increase lumbar flexion by 25%     Time  4    Period  Weeks    Status  New    Target Date  12/30/17      PT SHORT TERM GOAL #2   Title  Patient will report improved low back pain to 3/10 at worst     Time  4    Period  Weeks    Status  New    Target Date  12/30/17      PT SHORT TERM  GOAL #3   Title  Patient will be independent with HEP     Time  4    Period  Weeks    Status  New        PT Long Term Goals - 12/02/17 1318      PT LONG TERM GOAL #1   Title  Patient will stand at work with good posture     Time  8    Period  Weeks    Status  New    Target Date  01/27/18      PT LONG TERM GOAL #2   Title  Patient will demsotrate a 41% limitation on FOTO  Time  8    Period  Weeks    Status  New      PT LONG TERM GOAL #3   Title  Patient will report no greater then 2/10 pain with fucntional activity     Time  8    Period  Weeks    Status  New    Target Date  01/27/18             Plan - 12/01/17 1653    Clinical Impression Statement  Patient is a 57 year old male who presents with diffuse upper and lower back pain. The pain is worse with extension. He has spasming along bilateral paraspinals form thoracic to lumbar spine. Imaging suggests multi level endplate spuring. He would beneift from skilled therapy to improve lumbar mobility and posture. He would also benefit from lift training in order to perfrom his work tasks.     History and Personal Factors relevant to plan of care:  open heart surgery which may have effected his posture     Clinical Presentation  Evolving    Clinical Presentation due to:  increasing pain with work; pain in creasing over time     Clinical Decision Making  Moderate    Rehab Potential  Good    PT Frequency  2x / week    PT Duration  6 weeks    PT Treatment/Interventions  ADLs/Self Care Home Management;Cryotherapy;Electrical Stimulation;Iontophoresis 4mg /ml Dexamethasone;Moist Heat;DME Instruction;Patient/family education;Therapeutic exercise;Therapeutic activities;Functional mobility training;Neuromuscular re-education;Dry needling;Taping    PT Next Visit Plan  continue with postural correction exercises; review low back stretches; begin light core strengthening exercises; will likley require thoracic to lumbar PA  mobilizations     PT Home Exercise Plan  lower trunk rotation; scap retraction; tenis ball trigger point release; abdominal breathing     Consulted and Agree with Plan of Care  Patient       Patient will benefit from skilled therapeutic intervention in order to improve the following deficits and impairments:  Abnormal gait, Pain, Postural dysfunction, Decreased activity tolerance, Decreased range of motion, Decreased strength, Impaired flexibility, Obesity  Visit Diagnosis: Chronic bilateral low back pain without sciatica  Abnormal posture  Muscle spasm of back     Problem List Patient Active Problem List   Diagnosis Date Noted  . Hypersomnia 11/29/2017  . Other headache syndrome 05/05/2017  . Vertigo 05/05/2017  . IFG (impaired fasting glucose) 08/21/2016  . Obesity (BMI 30-39.9) 06/23/2013  . Stress-induced cardiomyopathy -- essentially resolved   . CVA (cerebral infarction)   . OSA (obstructive sleep apnea)   . Hyperlipidemia   . Physical deconditioning 01/24/2013  . Tinea pedis 01/18/2013  . Essential hypertension 01/13/2013  . 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[place]) 01/11/2013  . ARF (acute renal failure) with tubular necrosis requiring HD 01/11/2013  . Anemia 01/11/2013  . Thrombocytopenia, unspecified ([place]) 01/11/2013  . 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 12/29/2012    Carney Living PT DPT  12/02/2017, 1:21 PM  [facility] [address] [place], [place], [address] Phone: [phone]   Fax:  [phone]  Name: Nicholas Potts MRN: [id number] Date of Birth: 08-14-1960

## 2017-12-07 ENCOUNTER — Encounter: Payer: Self-pay | Admitting: Physical Therapy

## 2017-12-07 ENCOUNTER — Other Ambulatory Visit: Payer: Self-pay | Admitting: Cardiology

## 2017-12-07 ENCOUNTER — Ambulatory Visit: Payer: BLUE CROSS/BLUE SHIELD | Attending: Family Medicine | Admitting: Physical Therapy

## 2017-12-07 DIAGNOSIS — M62830 Muscle spasm of back: Secondary | ICD-10-CM | POA: Diagnosis not present

## 2017-12-07 DIAGNOSIS — G8929 Other chronic pain: Secondary | ICD-10-CM | POA: Insufficient documentation

## 2017-12-07 DIAGNOSIS — M545 Low back pain, unspecified: Secondary | ICD-10-CM

## 2017-12-07 DIAGNOSIS — R293 Abnormal posture: Secondary | ICD-10-CM | POA: Diagnosis not present

## 2017-12-07 NOTE — Patient Instructions (Signed)
Over Head Pull: Narrow Grip    Boca arriba, rodillas flexionadas, pies apoyados, banda a travs de los muslos, codos extendidos [employer] Separe las manos (punto de partida). Manteniendo los codos extendidos, [employer] brazos [place] y por encima de la cabeza, manos [place]. Mantenga la tensin uniforme en la banda. Sostenga por un momento. Regrese lentamente, manteniendo la tensin, a la posicin de inicio. Repita _10__ veces. Banda color __Verde______   Copyright  VHI. All rights reserved.       ___  Side Pull: Double Arm    Boca arriba, rodillas flexionadas, pies apoyados. Brazos perpendiculares al cuerpo, hombros a nivel, codos extendidos pero relajados. [employer], codos extendidos. La banda de resistencia debe cruzar las Advertising account executive, y las manos hacia el piso. Sostenga por un momento. Regrese lentamente a la posicin de inicio. Repita _10__ veces. Banda color __Verde______    Copyright  VHI. All rights reserved.    Sash    Boca arriba, rodillas flexionadas, pies apoyados, mano izquierda sobre la cadera izquierda, mano derecha por encima de la izquierda. [place] el brazo derecho EN DIAGONAL (cadera a hombro) a travs del pecho. Lleve el brazo derecho al lado de la cabeza [place]. Sostenga por un momento. Regrese lentamente a la posicin de inicio. Repita ___ veces. Realice con el brazo izquierdo. Banda de color ___Verde_____   Copyright  VHI. All rights reserved.    Shoulder Rotation: Double ArmShoulder Rotation: Double Arm    Boca arriba, rodillas flexionadas, pies apoyados, codos a los lados del cuerpo, flexionados a 90, palmas arriba. Amboy y llevndolas hacia abajo y al piso, manteniendo los codos cerca del lado del cuerpo. Sostenga por un momento. Regrese lentamente a la posicin de inicio. Repita _10__ veces. Banda de color __Verde______   Copyright  VHI. All rights reserved.

## 2017-12-07 NOTE — Therapy (Signed)
[place], [place], [address] Phone: [phone]   Fax:  [phone]  Physical Therapy Treatment  Patient Details  Name: Nicholas Potts MRN: [id number] Date of Birth: 08/09/1960 Referring Provider: Dr Marijean Bravo    Encounter Date: 12/07/2017  PT End of Session - 12/07/17 1723    Visit Number  2    Number of Visits  16    Date for PT Re-Evaluation  01/27/18    PT Start Time  1633    PT Stop Time  6063    PT Time Calculation (min)  42 min    Behavior During Therapy  [facility] for tasks assessed/performed       Past Medical History:  Diagnosis Date  . 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[place]) 2014   post op   . ARF (acute respiratory failure) ([place]) 2014  . Blood dyscrasia   . Chronic kidney disease 2014   ARF   . Complication of anesthesia 2014   - post op - in combination of pain pills-  Cardiac and Respiratory Arrest- prior to sleep apnea diagonosis  . Constipation   . CVA (cerebral infarction)   . Hyperlipidemia   . Hypertension   . OSA (obstructive sleep apnea)   . Sleep apnea   . Stress-induced cardiomyopathy -- essentially resolved    Post-op Narcotic induced Acute Hypoxic respiratory failure with respiratory and PEA cardiac arrest following accidental narcotic overdose; initial [place] 20-20%, now improved to 55%   Was supported with Impella Cardiac Cath - non-obstructive CAD   . Thrombocytopenia ([place]) 2014    Past Surgical History:  Procedure Laterality Date  . ACHILLES TENDON REPAIR    . CARDIAC CATHETERIZATION  12/29/2012   RN LHC --> Impella. for severe shock with low EF. Mild to moderate RV pressure elevation. Normal coronary arteries. Cardiac Output 2.5, Index 1.46.  Marland Kitchen COLONOSCOPY WITH PROPOFOL N/A 10/30/2015   Procedure: COLONOSCOPY WITH PROPOFOL;  Surgeon: Mauri Pole, MD;  Location: [place] ENDOSCOPY;  Service: Endoscopy;  Laterality: N/A;  . INSERTION OF DIALYSIS CATHETER Right 01/09/2013    Procedure: INSERTION OF DIALYSIS CATHETER;  Surgeon: Angelia Mould, MD;  Location: [facility];  Service: Vascular;  Laterality: Right;  . LEFT AND RIGHT HEART CATHETERIZATION WITH CORONARY ANGIOGRAM  12/29/2012   Procedure: LEFT AND RIGHT HEART CATHETERIZATION WITH CORONARY ANGIOGRAM;  Surgeon: Sherren Mocha, MD;  Location[facility] C[facility] LAB;  Service: Cardiovascular;;  . TRANSTHORACIC ECHOCARDIOGRAM  12/29/2012   EF 25% with periapical HK/AKA.  --> Followup echo 7/25 -- EF of 35%  . TRANSTHORACIC ECHOCARDIOGRAM  01/2013 ; 07/2015   a) EF 55-60%. No regional WMA. Grade 1 diastolic dysfunction. Mild LA dilatation.; b) Normal LV size with mild LV hypertrophy. EF 55-60%. Normal RV    There were no vitals filed for this visit.  Subjective Assessment - 12/07/17 1636    Subjective  Pain is a little better.  then as the day goes on it gets work.  almost no pain when he does not work.  pain starts 2-3 hours after starting work    Patient is accompained by:  Interpreter    Currently in Pain?  Yes    Pain Score  8     Pain Location  Thoracic    Pain Orientation  Right;Left;Mid;Lower    Pain Descriptors / Indicators  Aching    Aggravating Factors   standing after 2-3 hours ar work    Pain Relieving Factors  laying down  Pain Score  4    Pain Location  Back    Pain Orientation  Right;Left;Lower    Pain Descriptors / Indicators  Throbbing    Pain Type  Chronic pain    Aggravating Factors   watching TV    Pain Relieving Factors  change of position    Effect of Pain on Daily Activities  limits comfortable sitting                       [facility] Adult PT Treatment/Exercise - 12/07/17 0001      Self-Care   Self-Care  Other Self-Care Comments    Other Self-Care Comments   log roll, on side to sit   instruction/ practice able to do with less pain  "It is easy"      Lumbar Exercises: Stretches   Lower Trunk Rotation Limitations  5 X 10 seconds      Lumbar Exercises: Standing   Other  Standing Lumbar Exercises  posterior tilt 5 X 2 sets      Lumbar Exercises: Supine   AB Set Limitations  several trials of abdominal bracing as he is breathing.  Unable to breath in /out in a row    Clam  -- needed max cues    Bent Knee Raise  5 reps mild pain ,  not holding abdominals with lowering  right leg    Other Supine Lumbar Exercises  fist squeeze 5 x 5 seconds with abdominal bracing cues      Shoulder Exercises: Supine   Horizontal ABduction  10 reps    Theraband Level (Shoulder Horizontal ABduction)  Level 3 (Green)    External Rotation  10 reps    Theraband Level (Shoulder External Rotation)  Level 3 (Green)    Flexion  10 reps    Theraband Level (Shoulder Flexion)  Level 3 (Green) narrow grip    Other Supine Exercises  sash ,   green band mod cues initially,             PT Education - 12/07/17 1706    Education Details  HEP,  sitting standing posture    Person(s) Educated  Patient    Methods  Demonstration;Explanation;Tactile cues;Verbal cues;Handout    Comprehension  Verbalized understanding;Returned demonstration       PT Short Term Goals - 12/02/17 0901      PT SHORT TERM GOAL #1   Title  Patient will increase lumbar flexion by 25%     Time  4    Period  Weeks    Status  New    Target Date  12/30/17      PT SHORT TERM GOAL #2   Title  Patient will report improved low back pain to 3/10 at worst     Time  4    Period  Weeks    Status  New    Target Date  12/30/17      PT SHORT TERM GOAL #3   Title  Patient will be independent with HEP     Time  4    Period  Weeks    Status  New        PT Long Term Goals - 12/02/17 1318      PT LONG TERM GOAL #1   Title  Patient will stand at work with good posture     Time  8    Period  Weeks    Status  New  Target Date  01/27/18      PT LONG TERM GOAL #2   Title  Patient will demsotrate a 41% limitation on FOTO     Time  8    Period  Weeks    Status  New      PT LONG TERM GOAL #3   Title   Patient will report no greater then 2/10 pain with fucntional activity     Time  8    Period  Weeks    Status  New    Target Date  01/27/18            Plan - 12/07/17 1724    Clinical Impression Statement  patient has been doing the HEP and has noticed a little bit improvement in pain.  he was able to progress the HEP to include upper back /  scapular stabilization .  He is able to relieve pain completely with sitting resting hands on mat behind him.    he declined the need of modalities for pain at end of session.     PT Next Visit Plan  continue with postural correction exercises; review low back stretches; ajnd supine scapular stabilization ex ( green) begin light core strengthening exercises; will likley require thoracic to lumbar PA mobilizations     PT Home Exercise Plan  lower trunk rotation; scap retraction; tenis ball trigger point release; abdominal breathing Supine scapular stabilization exercises.    Consulted and Agree with Plan of Care  Patient       Patient will benefit from skilled therapeutic intervention in order to improve the following deficits and impairments:     Visit Diagnosis: Chronic bilateral low back pain without sciatica  Abnormal posture  Muscle spasm of back     Problem List Patient Active Problem List   Diagnosis Date Noted  . Hypersomnia 11/29/2017  . Other headache syndrome 05/05/2017  . Vertigo 05/05/2017  . IFG (impaired fasting glucose) 08/21/2016  . Obesity (BMI 30-39.9) 06/23/2013  . Stress-induced cardiomyopathy -- essentially resolved   . CVA (cerebral infarction)   . OSA (obstructive sleep apnea)   . Hyperlipidemia   . Physical deconditioning 01/24/2013  . Tinea pedis 01/18/2013  . Essential hypertension 01/13/2013  . 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[place]) 01/11/2013  . ARF (acute renal failure) with tubular necrosis requiring HD 01/11/2013  . Anemia 01/11/2013  . Thrombocytopenia, unspecified ([place]) 01/11/2013  . History of  Cardiac and respiratory arrest 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 narcotic overdose 12/29/2012    HARRIS,KAREN PTA 12/07/2017, 5:30 PM  [place], [place], [address] Phone: [phone]   Fax:  [phone]  Name: Nicholas Potts MRN: [id number] Date of Birth: 05-08-1961

## 2017-12-10 ENCOUNTER — Encounter

## 2017-12-17 ENCOUNTER — Encounter

## 2017-12-20 ENCOUNTER — Ambulatory Visit: Payer: BLUE CROSS/BLUE SHIELD | Admitting: Physical Therapy

## 2017-12-20 ENCOUNTER — Encounter: Payer: Self-pay | Admitting: Physical Therapy

## 2017-12-20 DIAGNOSIS — G8929 Other chronic pain: Secondary | ICD-10-CM

## 2017-12-20 DIAGNOSIS — R293 Abnormal posture: Secondary | ICD-10-CM

## 2017-12-20 DIAGNOSIS — M62830 Muscle spasm of back: Secondary | ICD-10-CM

## 2017-12-20 DIAGNOSIS — M545 Low back pain: Secondary | ICD-10-CM | POA: Diagnosis not present

## 2017-12-20 DIAGNOSIS — G4733 Obstructive sleep apnea (adult) (pediatric): Secondary | ICD-10-CM | POA: Diagnosis not present

## 2017-12-20 NOTE — Patient Instructions (Addendum)
Programa de caminar.  Todos los dias de 15 a 20 minutos al dia, por 5 dias a la semana.  Calf Stretch    Despues de caminar con el pie derecho por delante y el izquierdo atrs, inclnese hacia adelante manteniendo el taln izquierdo tocando el suelo. Mantenga __30__ segundos contando en voz alta. Repita con la otra pierna por delante. Repita la serie _3___ veces. Haga ___1_ sesiones por da  [email or web address].[email or web address]   Copyright  VHI. All rights reserved.

## 2017-12-20 NOTE — Therapy (Signed)
[place], [place], [address] Phone: [phone]   Fax:  [phone]  Physical Therapy Treatment  Patient Details  Name: Nicholas Potts MRN: [id number] Date of Birth: Oct 19, 1960 Referring Provider: Dr Marijean Bravo    Encounter Date: 12/20/2017  PT End of Session - 12/20/17 1648    Visit Number  3    Number of Visits  16    Date for PT Re-Evaluation  01/27/18    PT Start Time  0973    PT Stop Time  1646    PT Time Calculation (min)  59 min    Activity Tolerance  Patient tolerated treatment well    Behavior During Therapy  [facility] for tasks assessed/performed       Past Medical History:  Diagnosis Date  . 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[place]) 2014   post op   . ARF (acute respiratory failure) ([place]) 2014  . Blood dyscrasia   . Chronic kidney disease 2014   ARF   . Complication of anesthesia 2014   - post op - in combination of pain pills-  Cardiac and Respiratory Arrest- prior to sleep apnea diagonosis  . Constipation   . CVA (cerebral infarction)   . Hyperlipidemia   . Hypertension   . OSA (obstructive sleep apnea)   . Sleep apnea   . Stress-induced cardiomyopathy -- essentially resolved    Post-op Narcotic induced Acute Hypoxic respiratory failure with respiratory and PEA cardiac arrest following accidental narcotic overdose; initial [place] 20-20%, now improved to 55%   Was supported with Impella Cardiac Cath - non-obstructive CAD   . Thrombocytopenia ([place]) 2014    Past Surgical History:  Procedure Laterality Date  . ACHILLES TENDON REPAIR    . CARDIAC CATHETERIZATION  12/29/2012   RN LHC --> Impella. for severe shock with low EF. Mild to moderate RV pressure elevation. Normal coronary arteries. Cardiac Output 2.5, Index 1.46.  Marland Kitchen COLONOSCOPY WITH PROPOFOL N/A 10/30/2015   Procedure: COLONOSCOPY WITH PROPOFOL;  Surgeon: Mauri Pole, MD;  Location: [facility];  Service: Endoscopy;   Laterality: N/A;  . INSERTION OF DIALYSIS CATHETER Right 01/09/2013   Procedure: INSERTION OF DIALYSIS CATHETER;  Surgeon: Angelia Mould, MD;  Location: [facility];  Service: Vascular;  Laterality: Right;  . LEFT AND RIGHT HEART CATHETERIZATION WITH CORONARY ANGIOGRAM  12/29/2012   Procedure: LEFT AND RIGHT HEART CATHETERIZATION WITH CORONARY ANGIOGRAM;  Surgeon: Sherren Mocha, MD;  Location: [facility] CATH LAB;  Service: Cardiovascular;;  . TRANSTHORACIC ECHOCARDIOGRAM  12/29/2012   EF 25% with periapical HK/AKA.  --> Followup echo 7/25 -- EF of 35%  . TRANSTHORACIC ECHOCARDIOGRAM  01/2013 ; 07/2015   a) EF 55-60%. No regional WMA. Grade 1 diastolic dysfunction. Mild LA dilatation.; b) Normal LV size with mild LV hypertrophy. EF 55-60%. Normal RV    There were no vitals filed for this visit.  Subjective Assessment - 12/20/17 1542    Subjective  I have done the exercises 2 x a day and am not better.  I did not work today so no low back pain,      Patient is accompained by:  Interpreter    Currently in Pain?  Yes    Pain Score  7     Pain Location  Thoracic    Pain Descriptors / Indicators  Aching    Pain Relieving Factors  laying down    Effect of Pain on Daily Activities  sitting    Pain Score  0    Pain Location  Back    Pain Orientation  Right;Left;Lower    Pain Type  Chronic pain    Aggravating Factors   working    Pain Relieving Factors  change of position                       [facility] Adult PT Treatment/Exercise - 12/20/17 0001      Ambulation/Gait   Gait Comments  holds right shoulder higher, and uses little trunk rotation.  He was able to correct after instruction.  mirror used      Self-Care   Other Self-Care Comments   now log rolling without cues in clinic,  sitting practice for good posture.       Lumbar Exercises: Stretches   Cabin crew Limitations  added to HEP  3 x 30 seconds to be done after treadmill.       Lumbar  Exercises: Aerobic   Tread Mill  up to 1.8 MPH ( self adjusted)  5 minutes on level.  he was able to talk without being short of breath.  cued for stepping lighter to avoid jarring.  Advised against elevation  due to old achilles injury.    HEP to work up to 15 minutes-  30 minutes 5 days a week.       Lumbar Exercises: Standing   Row  20 reps 20 LBS,  able to decrease pain after cues      Shoulder Exercises: Supine   Other Supine Exercises  decompression 5 minutes,  shoulder,  head and left press 5 x 5 seconds,  leg lengthener 5 x 5 seconds each.  some relief with thoracic      Shoulder Exercises: Seated   Other Seated Exercises  shoulder rolls      Modalities   Modalities  Cryotherapy      Cryotherapy   Number Minutes Cryotherapy  10 Minutes    Cryotherapy Location  -- thoracic    Type of Cryotherapy  -- cold pack             PT Education - 12/20/17 1639    Education Details  HEP,,  POsture ed sitting and walking    Person(s) Educated  Patient    Methods  Explanation;Demonstration;Handout;Verbal cues    Comprehension  Verbalized understanding;Returned demonstration       PT Short Term Goals - 12/02/17 0901      PT SHORT TERM GOAL #1   Title  Patient will increase lumbar flexion by 25%     Time  4    Period  Weeks    Status  New    Target Date  12/30/17      PT SHORT TERM GOAL #2   Title  Patient will report improved low back pain to 3/10 at worst     Time  4    Period  Weeks    Status  New    Target Date  12/30/17      PT SHORT TERM GOAL #3   Title  Patient will be independent with HEP     Time  4    Period  Weeks    Status  New        PT Long Term Goals - 12/02/17 1318      PT LONG TERM GOAL #1   Title  Patient will stand at work with good posture  Time  8    Period  Weeks    Status  New    Target Date  01/27/18      PT LONG TERM GOAL #2   Title  Patient will demsotrate a 41% limitation on FOTO     Time  8    Period  Weeks    Status  New       PT LONG TERM GOAL #3   Title  Patient will report no greater then 2/10 pain with fucntional activity     Time  8    Period  Weeks    Status  New    Target Date  01/27/18            Plan - 12/20/17 1649    Clinical Impression Statement  Patient exercise continued today with posture exercise.  he tolerated new exercises with mild increases in pain.  Pain was addresses with cues for changes in exercise technique and with cold pack at end of session.  No low back pain today due to not having to work.     PT Next Visit Plan  continue with postural correction exercises; review low back stretches; See how home walking program is doing. (  make sure achilles is not getting sore) begin light core strengthening exercises; will likley require thoracic to lumbar PA mobilizations     PT Home Exercise Plan  lower trunk rotation; scap retraction; tenis ball trigger point release; abdominal breathing Supine scapular stabilization exercises.  walking program,  calf stretch post walking    Consulted and Agree with Plan of Care  Patient       Patient will benefit from skilled therapeutic intervention in order to improve the following deficits and impairments:     Visit Diagnosis: Chronic bilateral low back pain without sciatica  Abnormal posture  Muscle spasm of back     Problem List Patient Active Problem List   Diagnosis Date Noted  . Hypersomnia 11/29/2017  . Other headache syndrome 05/05/2017  . Vertigo 05/05/2017  . IFG (impaired fasting glucose) 08/21/2016  . Obesity (BMI 30-39.9) 06/23/2013  . Stress-induced cardiomyopathy -- essentially resolved   . CVA (cerebral infarction)   . OSA (obstructive sleep apnea)   . Hyperlipidemia   . Physical deconditioning 01/24/2013  . Tinea pedis 01/18/2013  . Essential hypertension 01/13/2013  . 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[place]) 01/11/2013  . ARF (acute renal failure) with tubular necrosis requiring HD 01/11/2013  . Anemia 01/11/2013   . Thrombocytopenia, unspecified ([place]) 01/11/2013  . 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 12/29/2012    Macauley Mossberg PTA 12/20/2017, 4:55 PM  [facility] [address] [place], [place], [address] Phone: [phone]   Fax:  [phone]  Name: JOSIP MEROLLA MRN: [id number] Date of Birth: 21-Jul-1960

## 2017-12-21 ENCOUNTER — Ambulatory Visit: Payer: BLUE CROSS/BLUE SHIELD | Admitting: Urgent Care

## 2017-12-27 ENCOUNTER — Ambulatory Visit: Payer: BLUE CROSS/BLUE SHIELD | Admitting: Physical Therapy

## 2017-12-27 DIAGNOSIS — M62830 Muscle spasm of back: Secondary | ICD-10-CM

## 2017-12-27 DIAGNOSIS — G8929 Other chronic pain: Secondary | ICD-10-CM | POA: Diagnosis not present

## 2017-12-27 DIAGNOSIS — R293 Abnormal posture: Secondary | ICD-10-CM

## 2017-12-27 DIAGNOSIS — M545 Low back pain, unspecified: Secondary | ICD-10-CM

## 2017-12-28 ENCOUNTER — Encounter: Payer: Self-pay | Admitting: Physical Therapy

## 2017-12-28 NOTE — Therapy (Signed)
[place], [place], [address] Phone: [phone]   Fax:  [phone]  Physical Therapy Treatment  Patient Details  Name: Nicholas Potts MRN: [id number] Date of Birth: 1961-01-05 Referring Provider: Dr Marijean Bravo    Encounter Date: 12/27/2017  PT End of Session - 12/27/17 1642    Visit Number  4    Number of Visits  16    Date for PT Re-Evaluation  01/27/18    Authorization Type  [employer]     PT Start Time  1545    PT Stop Time  1624    PT Time Calculation (min)  39 min    Activity Tolerance  Patient tolerated treatment well    Behavior During Therapy  [facility] for tasks assessed/performed       Past Medical History:  Diagnosis Date  . 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[place]) 2014   post op   . ARF (acute respiratory failure) ([place]) 2014  . Blood dyscrasia   . Chronic kidney disease 2014   ARF   . Complication of anesthesia 2014   - post op - in combination of pain pills-  Cardiac and Respiratory Arrest- prior to sleep apnea diagonosis  . Constipation   . CVA (cerebral infarction)   . Hyperlipidemia   . Hypertension   . OSA (obstructive sleep apnea)   . Sleep apnea   . Stress-induced cardiomyopathy -- essentially resolved    Post-op Narcotic induced Acute Hypoxic respiratory failure with respiratory and PEA cardiac arrest following accidental narcotic overdose; initial [place] 20-20%, now improved to 55%   Was supported with Impella Cardiac Cath - non-obstructive CAD   . Thrombocytopenia ([place]) 2014    Past Surgical History:  Procedure Laterality Date  . ACHILLES TENDON REPAIR    . CARDIAC CATHETERIZATION  12/29/2012   RN LHC --> Impella. for severe shock with low EF. Mild to moderate RV pressure elevation. Normal coronary arteries. Cardiac Output 2.5, Index 1.46.  Marland Kitchen COLONOSCOPY WITH PROPOFOL N/A 10/30/2015   Procedure: COLONOSCOPY WITH PROPOFOL;  Surgeon: Mauri Pole, MD;  Location: [facility];   Service: Endoscopy;  Laterality: N/A;  . INSERTION OF DIALYSIS CATHETER Right 01/09/2013   Procedure: INSERTION OF DIALYSIS CATHETER;  Surgeon: Angelia Mould, MD;  Location: [facility];  Service: Vascular;  Laterality: Right;  . LEFT AND RIGHT HEART CATHETERIZATION WITH CORONARY ANGIOGRAM  12/29/2012   Procedure: LEFT AND RIGHT HEART CATHETERIZATION WITH CORONARY ANGIOGRAM;  Surgeon: Sherren Mocha, MD;  Location[facility] CATH LAB;  Service: Cardiovascular;;  . TRANSTHORACIC ECHOCARDIOGRAM  12/29/2012   EF 25% with periapical HK/AKA.  --> Followup echo 7/25 -- EF of 35%  . TRANSTHORACIC ECHOCARDIOGRAM  01/2013 ; 07/2015   a) EF 55-60%. No regional WMA. Grade 1 diastolic dysfunction. Mild LA dilatation.; b) Normal LV size with mild LV hypertrophy. EF 55-60%. Normal RV    There were no vitals filed for this visit.  Subjective Assessment - 12/27/17 1550    Subjective  Patient reports no change in his pain. He is still having significant pain in his back and neck.     Patient is accompained by:  Interpreter    How long can you sit comfortably?  limited community distances     How long can you stand comfortably?  depends on activity     Diagnostic tests  x-ray thoracic and lumbar: endplate spuring at multpile levels. Per x-ray may be suggestive of diffuse idiopathic skeletal hyperstosis.  Currently in Pain?  Yes    Pain Score  7     Pain Location  Thoracic    Pain Orientation  Right;Left    Pain Descriptors / Indicators  Aching    Pain Type  Chronic pain    Pain Onset  More than a month ago    Pain Frequency  Constant    Aggravating Factors   working     Pain Relieving Factors  lying down     Effect of Pain on Daily Activities  sitting     Multiple Pain Sites  No                       [facility] Adult PT Treatment/Exercise - 12/28/17 0001      Lumbar Exercises: Stretches   Other Lumbar Stretch Exercise  attmepted posterior capsulestretch for thoracic spine but hte patient hasd  increased pain. Pain improved with cuing but still did       Lumbar Exercises: Standing   Row  20 reps 20 LBS,  able to decrease pain after cues    Shoulder Extension  20 reps      Shoulder Exercises: Seated   Other Seated Exercises  bilateral ER 2x10 red; seated shoulder flexion 2x10 red; seated horizontal abduction 2x10 red; required cuing for all but technique did not improve with cuing      Shoulder Exercises: Standing   Extension Limitations  2x10 red     Row Limitations  2x10 red              PT Education - 12/27/17 1608    Education Details  reviewed technique with ther-ex     Person(s) Educated  Patient    Methods  Explanation;Demonstration;Tactile cues;Verbal cues    Comprehension  Tactile cues required;Need further instruction;Verbal cues required       PT Short Term Goals - 12/02/17 0901      PT SHORT TERM GOAL #1   Title  Patient will increase lumbar flexion by 25%     Time  4    Period  Weeks    Status  New    Target Date  12/30/17      PT SHORT TERM GOAL #2   Title  Patient will report improved low back pain to 3/10 at worst     Time  4    Period  Weeks    Status  New    Target Date  12/30/17      PT SHORT TERM GOAL #3   Title  Patient will be independent with HEP     Time  4    Period  Weeks    Status  New        PT Long Term Goals - 12/02/17 1318      PT LONG TERM GOAL #1   Title  Patient will stand at work with good posture     Time  8    Period  Weeks    Status  New    Target Date  01/27/18      PT LONG TERM GOAL #2   Title  Patient will demsotrate a 41% limitation on FOTO     Time  8    Period  Weeks    Status  New      PT LONG TERM GOAL #3   Title  Patient will report no greater then 2/10 pain with fucntional activity     Time  8    Period  Weeks    Status  New    Target Date  01/27/18            Plan - 12/27/17 1642    Clinical Impression Statement  At this point the patoient does not appear to be improving. He  has been consitent with his exercises at home. Therapy focused on manual therapy to improve his thoracic extension. He reported no improvmeent in pain but had no increse in pain eiither. Te patient was advised to note any improvement in pain over the next few days. 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     Clinical Presentation  Evolving    Clinical Decision Making  Moderate    Rehab Potential  Good    PT Frequency  2x / week    PT Duration  6 weeks    PT Treatment/Interventions  ADLs/Self Care Home Management;Cryotherapy;Electrical Stimulation;Iontophoresis 4mg /ml Dexamethasone;Moist Heat;DME Instruction;Patient/family education;Therapeutic exercise;Therapeutic activities;Functional mobility training;Neuromuscular re-education;Dry needling;Taping    PT Next Visit Plan  continue with postural correction exercises; review low back stretches; See how home walking program is doing. (  make sure achilles is not getting sore) begin light core strengthening exercises; will likley require thoracic to lumbar PA mobilizations     Consulted and Agree with Plan of Care  Patient       Patient will benefit from skilled therapeutic intervention in order to improve the following deficits and impairments:  Abnormal gait, Pain, Postural dysfunction, Decreased activity tolerance, Decreased range of motion, Decreased strength, Impaired flexibility, Obesity  Visit Diagnosis: Chronic bilateral low back pain without sciatica  Abnormal posture  Muscle spasm of back     Problem List Patient Active Problem List   Diagnosis Date Noted  . Hypersomnia 11/29/2017  . Other headache syndrome 05/05/2017  . Vertigo 05/05/2017  . IFG (impaired fasting glucose) 08/21/2016  . Obesity (BMI 30-39.9) 06/23/2013  . Stress-induced cardiomyopathy -- essentially resolved   . CVA (cerebral infarction)   . OSA (obstructive sleep apnea)   . Hyperlipidemia   . Physical deconditioning  01/24/2013  . Tinea pedis 01/18/2013  . Essential hypertension 01/13/2013  . 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[place]) 01/11/2013  . ARF (acute renal failure) with tubular necrosis requiring HD 01/11/2013  . Anemia 01/11/2013  . Thrombocytopenia, unspecified ([place]) 01/11/2013  . 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 12/29/2012    Carney Living PT DPT  12/28/2017, 5:31 PM  [place], [place], [address] Phone: [phone]   Fax:  [phone]  Name: Nicholas Potts MRN: [id number] Date of Birth: 1961/04/19

## 2017-12-30 ENCOUNTER — Ambulatory Visit: Payer: BLUE CROSS/BLUE SHIELD | Admitting: Family Medicine

## 2018-01-01 ENCOUNTER — Ambulatory Visit (INDEPENDENT_AMBULATORY_CARE_PROVIDER_SITE_OTHER): Payer: BLUE CROSS/BLUE SHIELD | Admitting: Family Medicine

## 2018-01-01 DIAGNOSIS — R7989 Other specified abnormal findings of blood chemistry: Secondary | ICD-10-CM | POA: Diagnosis not present

## 2018-01-01 DIAGNOSIS — R748 Abnormal levels of other serum enzymes: Secondary | ICD-10-CM

## 2018-01-01 DIAGNOSIS — E782 Mixed hyperlipidemia: Secondary | ICD-10-CM

## 2018-01-01 NOTE — Progress Notes (Signed)
Labs only

## 2018-01-02 LAB — COMPREHENSIVE METABOLIC PANEL
A/G RATIO: 1.7 (ref 1.2–2.2)
ALK PHOS: 97 IU/L (ref 39–117)
ALT: 24 IU/L (ref 0–44)
AST: 20 IU/L (ref 0–40)
Albumin: 4.3 g/dL (ref 3.5–5.5)
BILIRUBIN TOTAL: 0.4 mg/dL (ref 0.0–1.2)
BUN/Creatinine Ratio: 14 (ref 9–20)
BUN: 14 mg/dL (ref 6–24)
CO2: 21 mmol/L (ref 20–29)
Calcium: 9 mg/dL (ref 8.7–10.2)
Chloride: 106 mmol/L (ref 96–106)
Creatinine, Ser: 0.97 mg/dL (ref 0.76–1.27)
GFR, EST AFRICAN AMERICAN: 99 mL/min/{1.73_m2} (ref >59)
GFR, EST NON AFRICAN AMERICAN: 86 mL/min/{1.73_m2} (ref >59)
Globulin, Total: 2.5 g/dL (ref 1.5–4.5)
Glucose: 92 mg/dL (ref 65–99)
POTASSIUM: 4.9 mmol/L (ref 3.5–5.2)
SODIUM: 142 mmol/L (ref 134–144)
Total Protein: 6.8 g/dL (ref 6.0–8.5)

## 2018-01-02 LAB — LIPID PANEL
Chol/HDL Ratio: 6.1 ratio — ABNORMAL HIGH (ref 0.0–5.0)
Cholesterol, Total: 209 mg/dL — ABNORMAL HIGH (ref 100–199)
HDL: 34 mg/dL — ABNORMAL LOW (ref >39)
LDL CALC: 133 mg/dL — AB (ref 0–99)
Triglycerides: 208 mg/dL — ABNORMAL HIGH (ref 0–149)
VLDL CHOLESTEROL CAL: 42 mg/dL — AB (ref 5–40)

## 2018-01-02 NOTE — Progress Notes (Signed)
Lab only visit - pt not seen.  

## 2018-01-03 ENCOUNTER — Ambulatory Visit: Payer: BLUE CROSS/BLUE SHIELD | Admitting: Physical Therapy

## 2018-01-03 DIAGNOSIS — G8929 Other chronic pain: Secondary | ICD-10-CM | POA: Diagnosis not present

## 2018-01-03 DIAGNOSIS — R293 Abnormal posture: Secondary | ICD-10-CM | POA: Diagnosis not present

## 2018-01-03 DIAGNOSIS — M545 Low back pain: Secondary | ICD-10-CM | POA: Diagnosis not present

## 2018-01-03 DIAGNOSIS — M62830 Muscle spasm of back: Secondary | ICD-10-CM | POA: Diagnosis not present

## 2018-01-03 NOTE — Therapy (Signed)
[place], [place], [address] Phone: [phone]   Fax:  [phone]  Physical Therapy Treatment/Discharge   Patient Details  Name: Nicholas Potts MRN: [id number] Date of Birth: [birth year] Referring Provider: Dr Marijean Bravo    Encounter Date: 01/03/2018  PT End of Session - 01/03/18 1558    Visit Number  5    Number of Visits  16    Date for PT Re-Evaluation  01/27/18    Authorization Type  BCBS     PT Start Time  9371 Patient 10 minutes late     PT Stop Time  1630    PT Time Calculation (min)  35 min    Activity Tolerance  Patient tolerated treatment well    Behavior During Therapy  [facility] for tasks assessed/performed       Past Medical History:  Diagnosis Date  . 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[place]) 2014   post op   . ARF (acute respiratory failure) ([place]) 2014  . Blood dyscrasia   . Chronic kidney disease 2014   ARF   . Complication of anesthesia 2014   - post op - in combination of pain pills-  Cardiac and Respiratory Arrest- prior to sleep apnea diagonosis  . Constipation   . CVA (cerebral infarction)   . Hyperlipidemia   . Hypertension   . OSA (obstructive sleep apnea)   . Sleep apnea   . Stress-induced cardiomyopathy -- essentially resolved    Post-op Narcotic induced Acute Hypoxic respiratory failure with respiratory and PEA cardiac arrest following accidental narcotic overdose; initial [place] 20-20%, now improved to 55%   Was supported with Impella Cardiac Cath - non-obstructive CAD   . Thrombocytopenia ([place]) 2014    Past Surgical History:  Procedure Laterality Date  . ACHILLES TENDON REPAIR    . CARDIAC CATHETERIZATION  12/29/2012   RN LHC --> Impella. for severe shock with low EF. Mild to moderate RV pressure elevation. Normal coronary arteries. Cardiac Output 2.5, Index 1.46.  Marland Kitchen COLONOSCOPY WITH PROPOFOL N/A 10/30/2015   Procedure: COLONOSCOPY WITH PROPOFOL;  Surgeon: Mauri Pole, MD;  Location: [facility];  Service: Endoscopy;  Laterality: N/A;  . INSERTION OF DIALYSIS CATHETER Right 01/09/2013   Procedure: INSERTION OF DIALYSIS CATHETER;  Surgeon: Angelia Mould, MD;  Location: [facility];  Service: Vascular;  Laterality: Right;  . LEFT AND RIGHT HEART CATHETERIZATION WITH CORONARY ANGIOGRAM  12/29/2012   Procedure: LEFT AND RIGHT HEART CATHETERIZATION WITH CORONARY ANGIOGRAM;  Surgeon: Sherren Mocha, MD;  Location: [facility] CATH LAB;  Service: Cardiovascular;;  . TRANSTHORACIC ECHOCARDIOGRAM  12/29/2012   EF 25% with periapical HK/AKA.  --> Followup echo 7/25 -- EF of 35%  . TRANSTHORACIC ECHOCARDIOGRAM  01/2013 ; 07/2015   a) EF 55-60%. No regional WMA. Grade 1 diastolic dysfunction. Mild LA dilatation.; b) Normal LV size with mild LV hypertrophy. EF 55-60%. Normal RV    There were no vitals filed for this visit.  Subjective Assessment - 01/03/18 1645    Subjective  The patient reports no change in pain. He continues to have pain levels that can reach as high as a 9/10. He flet no difference after the last visit. He did exercises given to him by the chiropracter and flet more pain. he was in bed for 2 days aftter that.     How long can you sit comfortably?  limited community distances     How long can you stand comfortably?  depends on  activity     Diagnostic tests  x-ray thoracic and lumbar: endplate spuring at multpile levels. Per x-ray may be suggestive of diffuse idiopathic skeletal hyperstosis.     Currently in Pain?  Yes    Pain Score  7     Pain Location  Thoracic    Pain Orientation  Right;Left    Pain Descriptors / Indicators  Aching    Pain Type  Chronic pain    Pain Onset  More than a month ago    Pain Frequency  Constant    Aggravating Factors   working     Pain Relieving Factors  lying down     Effect of Pain on Daily Activities  sitting     Multiple Pain Sites  No    Pain Onset  More than a month ago    Pain Frequency  Constant     Aggravating Factors   working     Pain Relieving Factors  change of position     Effect of Pain on Daily Activities  limits comfort in sitting                        [facility] Adult PT Treatment/Exercise - 01/03/18 0001      Lumbar Exercises: Stretches   Other Lumbar Stretch Exercise  sink stretch 3x20 sec     Other Lumbar Stretch Exercise  Open book sytretch x5 with right x10 with left       Lumbar Exercises: Standing   Row  20 reps 20 LBS,  able to decrease pain after cues    Shoulder Extension  20 reps      Shoulder Exercises: Standing   Extension Limitations  2x10 red     Row Limitations  2x10 red     Other Standing Exercises  reviewed tennis ball trigger point release       Manual Therapy   Manual therapy comments  gross thoracic PA mobilization trom t-1 though t-12 and gross lumbar mobilization. Soft tissue mobilization to bilateral lumbar and throac paraspinals and periscapular srea.              PT Education - 01/03/18 1647    Education Details  reviewed exercises and stretches     Person(s) Educated  Patient    Methods  Explanation;Demonstration;Tactile cues;Verbal cues    Comprehension  Need further instruction;Verbalized understanding;Returned demonstration;Verbal cues required;Tactile cues required       PT Short Term Goals - 01/03/18 1655      PT SHORT TERM GOAL #1   Title  Patient will increase lumbar flexion by 25%     Baseline  no improvement     Time  4    Period  Weeks    Status  Not Met      PT SHORT TERM GOAL #2   Title  Patient will report improved low back pain to 3/10 at worst     Baseline  7/10 consitently     Time  4    Period  Weeks    Status  Not Met      PT SHORT TERM GOAL #3   Title  Patient will be independent with HEP     Time  4    Period  Weeks    Status  Not Met        PT Long Term Goals - 01/03/18 1655      PT LONG TERM GOAL #1   Title  Patient will stand at work with good posture     Time  8    Period   Weeks    Status  Not Met      PT LONG TERM GOAL #2   Title  Patient will demsotrate a 41% limitation on FOTO     Baseline  58% limitation     Time  8    Period  Weeks    Status  Not Met      PT LONG TERM GOAL #3   Title  Patient will report no greater then 2/10 pain with fucntional activity     Time  8    Period  Weeks    Status  Not Met            Plan - 01/03/18 1648    Clinical Impression Statement  Patient has reached max benefit for therapy. He has made no progress in 5 visits. Therapy has foicused on manual therapy the past 5 visist with home exercises and the patient is having more pain. He was also doing exercises given to him by the chiropracter that put him in bed for 2 days. He was given 5 light stretches and exercises to do gfor home and advsed to work on the. He would benefit from follow up with MD. D/C to HEP.     Clinical Presentation  Evolving    Clinical Decision Making  Moderate    Rehab Potential  Good    PT Frequency  2x / week    PT Duration  6 weeks    PT Treatment/Interventions  ADLs/Self Care Home Management;Cryotherapy;Electrical Stimulation;Iontophoresis 79m/ml Dexamethasone;Moist Heat;DME Instruction;Patient/family education;Therapeutic exercise;Therapeutic activities;Functional mobility training;Neuromuscular re-education;Dry needling;Taping    PT Next Visit Plan  D/C to HEP     PT Home Exercise Plan  see patient instructions        Patient will benefit from skilled therapeutic intervention in order to improve the following deficits and impairments:  Abnormal gait, Pain, Postural dysfunction, Decreased activity tolerance, Decreased range of motion, Decreased strength, Impaired flexibility, Obesity  Visit Diagnosis: Chronic bilateral low back pain without sciatica  Abnormal posture  Muscle spasm of back  PHYSICAL THERAPY DISCHARGE SUMMARY  Visits from Start of Care: 5  Current functional level related to goals / functional outcomes: Pain  with all activity    Remaining deficits: Difficulty with work and all ADL's    Education / Equipment: HEP  Plan: Patient agrees to discharge.  Patient goals were not met. Patient is being discharged due to lack of progress.  ?????       Problem List Patient Active Problem List   Diagnosis Date Noted  . Hypersomnia 11/29/2017  . Other headache syndrome 05/05/2017  . Vertigo 05/05/2017  . IFG (impaired fasting glucose) 08/21/2016  . Obesity (BMI 30-39.9) 06/23/2013  . Stress-induced cardiomyopathy -- essentially resolved   . CVA (cerebral infarction)   . OSA (obstructive sleep apnea)   . Hyperlipidemia   . Physical deconditioning 01/24/2013  . Tinea pedis 01/18/2013  . Essential hypertension 01/13/2013  . 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 01/11/2013  . ARF (acute renal failure) with tubular necrosis requiring HD 01/11/2013  . Anemia 01/11/2013  . Thrombocytopenia, unspecified ([place] 01/11/2013  . 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 12/29/2012    DCarney LivingPT DPT  01/03/2018, 4:56 PM  [place] [address]Phone: [phone]  Fax:  [phone]  Name: Nicholas Potts MRN: [id number] Date of Birth: 01-Feb-1961

## 2018-01-04 ENCOUNTER — Telehealth: Payer: Self-pay | Admitting: Internal Medicine

## 2018-01-04 NOTE — Telephone Encounter (Signed)
Called pt but unable to reach. Left a message for pt to return call.

## 2018-01-05 NOTE — Telephone Encounter (Signed)
Attempted to call patient today. I did not receive an answer at time of call. I have left a voicemail message for pt to return call. X2

## 2018-01-06 ENCOUNTER — Other Ambulatory Visit: Payer: Self-pay

## 2018-01-06 ENCOUNTER — Ambulatory Visit: Payer: BLUE CROSS/BLUE SHIELD | Admitting: Internal Medicine

## 2018-01-06 ENCOUNTER — Encounter: Payer: Self-pay | Admitting: Family Medicine

## 2018-01-06 ENCOUNTER — Ambulatory Visit (INDEPENDENT_AMBULATORY_CARE_PROVIDER_SITE_OTHER): Payer: BLUE CROSS/BLUE SHIELD | Admitting: Family Medicine

## 2018-01-06 VITALS — BP 116/72 | HR 57 | Temp 99.8°F | Ht 67.0 in | Wt 260.2 lb

## 2018-01-06 DIAGNOSIS — M4810 Ankylosing hyperostosis [Forestier], site unspecified: Secondary | ICD-10-CM | POA: Diagnosis not present

## 2018-01-06 DIAGNOSIS — M545 Low back pain, unspecified: Secondary | ICD-10-CM

## 2018-01-06 DIAGNOSIS — E785 Hyperlipidemia, unspecified: Secondary | ICD-10-CM

## 2018-01-06 DIAGNOSIS — M549 Dorsalgia, unspecified: Secondary | ICD-10-CM

## 2018-01-06 NOTE — Telephone Encounter (Signed)
Attempted to call patient today. I did not receive an answer at time of call. I have left a voicemail message for pt to return call. X3 Per triage protocol, closing message as we have tried several times to reach pt.

## 2018-01-06 NOTE — Patient Instructions (Addendum)
voy a refirle a doctor de espalda. Tylenol or ibuprofen si necesario.   regrese en 3-6 meses por prueba de sangre.    Dolor de espalda en adultos Back Pain, Adult Muchos adultos sufren dolor de espalda de vez en cuando. Las causas comunes del dolor de espalda son las siguientes:  Ardelia Mems distensin del msculo o el ligamento.  Desgaste (degeneracin) de los discos vertebrales.  Artritis.  Un golpe en la espalda.  El dolor de espalda puede ser breve (agudo) o prolongado (crnico). Es posible que se realicen un examen fsico, anlisis de laboratorio u otros estudios de diagnstico por imgenes para Animator causa del Social research officer, government. Siga estas indicaciones en su casa: Control del dolor y de la rigidez  [employer] medicamentos de venta libre y los recetados solamente como se lo haya indicado el mdico.  Si se lo indican, aplique calor en la zona afectada con la frecuencia que le haya indicado el mdico. Use la fuente de calor que el mdico le recomiende, como una compresa de calor hmedo o una almohadilla trmica. ? Colquese una [employer] piel y la fuente de Freight forwarder. ? Aplique el calor durante 20 a 71minutos. ? Retire la fuente de calor si la piel se le pone de color rojo brillante. Esto es muy importante si no puede sentir el dolor, el calor ni el fro. Corre un mayor riesgo de sufrir quemaduras.  Si se lo indican, aplique hielo sobre la zona lesionada: ? Ponga el hielo en una bolsa plstica. ? Coloque una [employer] piel y la bolsa de hielo. ? Deje el hielo durante 20minutos, de 2 a 3veces por da, durante los primeros 2 o 3das. Actividad  No permanezca en la cama. Si hace reposo ms de 1 a 2 das, puede demorar su recuperacin.  Realice caminatas cortas en superficies planas tan pronto como le sea posible. Trate de caminar un poco ms de Publishing copy.  No se siente, conduzca o permanezca de pie en un mismo lugar durante ms de 30 minutos seguidos. Pararse o sentarse  durante largos perodos de Radiographer, therapeutic la espalda.  Use tcnicas apropiadas para levantar objetos. Cuando se inclina y Chief Executive Officer un [place], utilice posiciones que no sobrecarguen tanto la espalda: ? [place]. ? Mantenga la carga cerca del cuerpo. ? No se tuerza.  Haga actividad fsica habitualmente como se lo haya indicado el mdico. Hacer ejercicio ayudar a que su espalda se cure ms rpido. Adems, esto ayuda a prevenir lesiones de espalda al [employer] fuertes y flexibles.  El mdico puede recomendarle que consulte a un fisioterapeuta. Esta persona puede ayudarlo a elaborar un programa de ejercicios seguro. Haga ejercicios como se lo haya indicado el fisioterapeuta. Estilo de vida  Mantenga un peso saludable. El sobrepeso sobrecarga la espalda y hace que resulte difcil tener una buena [place].  Evite actividades o situaciones que lo hagan sentirse ansioso o estresado. Aprenda maneras de manejar la ansiedad y el estrs. Una forma de controlar el estrs es a travs del ejercicio. El estrs y la ansiedad aumentan la tensin muscular y pueden empeorar el dolor de espalda. Instrucciones generales  Duerma sobre un colchn firme en una posicin cmoda. Intente acostarse de costado, con las rodillas ligeramente flexionadas. Si se recuesta [employer], coloque una almohada debajo de las rodillas.  Siga el plan de tratamiento como se lo haya indicado el mdico. Esto puede incluir lo siguiente: ? Terapia cognitiva o conductual. ? Acupuntura o  terapia de masajes. ? Yoga o meditacin. Comunquese con un mdico si:  Siente un dolor que no se alivia con reposo o medicamentos.  Siente mucho dolor que se extiende a las piernas o las nalgas.  El dolor no mejora en 2 semanas.  Siente dolor por la noche.  Pierde peso.  Tiene fiebre o siente escalofros. Solicite ayuda de inmediato si:  Tiene nuevos problemas para controlar la vejiga o los  intestinos.  Siente debilidad o adormecimiento inusuales en los brazos o en las piernas.  Siente nuseas o vmitos.  Siente dolor abdominal.  Siente que va a desmayarse. Resumen  Muchos adultos sufren dolor de espalda de vez en cuando. Es posible que se realicen un examen fsico, anlisis de laboratorio u otros estudios de diagnstico por imgenes para Animator causa del Social research officer, government.  Use tcnicas apropiadas para levantar objetos. Cuando se inclina y levanta un [place], utilice posiciones que no sobrecarguen tanto la espalda.  Tome los medicamentos de venta libre o recetados y aplique calor o hielo solamente como se lo haya indicado el mdico. Esta informacin no tiene Marine scientist el consejo del mdico. Asegrese de hacerle al mdico cualquier pregunta que tenga. Document Released: 05/25/2005 Document Revised: 09/14/2016 Document Reviewed: 09/14/2016 Elsevier Interactive Patient Education  2018 [place] de alimentos para Management consultant nivel de triglicridos (Food Choices to Lower Your Triglycerides) Los triglicridos son un tipo de grasas que se Chief Strategy Officer. Un nivel elevado de triglicridos puede aumentar el riesgo de padecer enfermedades cardacas e infartos. Si sus niveles de triglicridos son altos, los alimentos que se ingieren y los hbitos de alimentacin son Theatre stage manager. Elegir los alimentos adecuados puede ayudar a Therapist, nutritional de triglicridos. Blyn?  Baje de peso si es necesario.  Limite o evite el alcohol.  Llene la mitad del plato con vegetales y ensaladas de hojas verdes.  [place] a dos porciones por da. Elija frutas en lugar de jugos.  Ocupe un cuarto del plato con cereales integrales. Busque la palabra "integral" en Equities trader de la lista de ingredientes.  Llene un cuarto del plato con alimentos con protenas magras.  Disfrute de pescados grasos (como salmn, caballa, sardinas y atn)  tres veces por semana.  [place] grasas saludables.  Limite los alimentos con alto contenido de almidn y Location manager.  Consuma ms comida casera y menos de restaurante, de buf y comida rpida.  Limite el consumo de alimentos fritos.  Cocine los alimentos utilizando mtodos que no sean la fritura.  Limite el consumo de grasas saturadas.  Verifique las listas de ingredientes para evitar alimentos con aceites parcialmente hidrogenados (grasas trans).  QU ALIMENTOS PUEDO COMER? Cereales Cereales integrales, como los panes de salvado o [place], las [place], los cereales y las pastas. Avena sin endulzar, trigo, [place], quinua o arroz integral. Tortillas de harina de maz o de salvado. Vegetales Verduras frescas o congeladas (crudas, al vapor, asadas o grilladas). Ensaladas de hojas verdes. Fruits Frutas frescas, en conserva (en su jugo natural) o frutas congeladas. Carnes y otros productos con protenas Carne de res molida (al 85% o ms [place]), carne de res de animales alimentados con pastos o carne de res sin la grasa. Pollo o pavo sin piel. Carne de pollo o de [place]. Cerdo sin la grasa. Todos los pescados y frutos de mar. Huevos. Porotos, guisantes o lentejas secos. Frutos secos o semillas sin sal. Frijoles secos o en lata  sin sal. Lcteos Productos lcteos con bajo contenido de grasas, como [place] o al 1%, quesos reducidos en grasas o al 2%, ricota con bajo contenido de grasas o [employer], o yogur natural con bajo contenido de [place]. Grasas y Naval architect en barra que no contengan grasas trans. [place] y condimentos para ensaladas livianos o reducidos en grasas. [place]. Aceites de crtamo, oliva o canola. [place] natural de man o almendra. Los artculos mencionados arriba pueden no ser [employer] Foods Company de las bebidas o los alimentos recomendados. Comunquese con el nutricionista para conocer ms opciones. QU ALIMENTOS NO SE  RECOMIENDAN? Cereales Pan blanco. Pastas blancas. Arroz blanco. Pan de maz. Bagels, pasteles y croissants. Galletas saladas que contengan grasas trans. Vegetales Papas blancas. Maz. Vegetales con crema o fritos. Verduras en National. Fruits Frutas secas. Fruta enlatada en almbar liviano o espeso. Jugo de frutas. Carnes y otros productos con protenas [place] de carne con Lobbyist. Costillas, alas de pollo, tocineta, salchicha, mortadela, salame, chinchulines, tocino, perros calientes, salchichas alemanas y embutidos envasados. Lcteos Leche entera o al 2%, crema, mezcla de Balm y crema y queso crema. Yogur entero o endulzado. Quesos con toda su grasa. Cremas no lcteas y coberturas batidas. Quesos procesados, quesos para untar o cuajadas. Dulces y postres Jarabe de maz, azcares, miel y Control and instrumentation engineer. Caramelos. Mermelada y [place]. Chrissie Noa. Cereales endulzados. Galletas, pasteles, bizcochuelos, donas, muffins y helado. Grasas y aceites [place], [place] en barra, [place] de [place], [place], [place] clarificada o grasa de tocino. Aceites de coco, de palmiste o de palma. Bebidas Alcohol. Bebidas endulzadas (como refrescos, limonadas y bebidas frutales o ponches). Los artculos mencionados arriba pueden no ser [employer] Foods Company de las bebidas y los alimentos que se Higher education careers adviser. Comunquese con el nutricionista para recibir ms informacin. Esta informacin no tiene Marine scientist el consejo del mdico. Asegrese de hacerle al mdico cualquier pregunta que tenga. Document Released: 11/12/2009 Document Revised: 05/30/2013 Document Reviewed: 03/29/2013 Elsevier Interactive Patient Education  2017 [employer].      IF you received an x-ray today, you will receive an invoice from [facility] Radiology. Please contact [facility]iology at [phone] with questions or concerns regarding your invoice.   IF you received labwork today, you will receive an invoice from [facility].  Please contact [facility] at [phone] with questions or concerns regarding your invoice.   Our billing staff will not be able to assist you with questions regarding bills from these companies.  You will be contacted with the lab results as soon as they are available. The fastest way to get your results is to activate your My Chart account. Instructions are located on the last page of this paperwork. If you have not heard from [place] regarding the results in 2 weeks, please contact this office.

## 2018-01-06 NOTE — Progress Notes (Signed)
Subjective:    Patient ID: Nicholas Potts, male    DOB: 25-Feb-1961, 57 y.o.   MRN: [id number]  HPI Nicholas Potts is a 57 y.o. male Presents today for: Chief Complaint  Patient presents with  . Back Pain    f/u from [facility]  . lab results   Here for follow up:  Back pain:  -See office viist May 30th.  Reported history of back pain, low back pain for 3 years, with increasing upper back pain between shoulder blades for the prior 6 months.  No known injury.  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  Did wear a back brace while working which helps some and Advil few times per week with some improvement.  Has seen chiropractor 6-8 times without significant relief.  No red flags on his history at that time.  Suspected both upper and lower back spasm component -  repetitive/overuse.  But also some component of relative deconditioning.  He was referred to physical therapy, recommend Tylenol and episodic Flexeril if needed.  Initial plan for follow-up in 3 to 4 weeks, this is his first visit back. X-ray was obtained last visit  -thoracic indicating multiple level bridging osteophytes suggesting possible diffuse idiopathic skeletal hyperostosis.  No focal findings.  Lumbar x-ray with disc space narrowing at L3-4 more so than L5-S1 with lower lumbar facet arthritis. Was not able to get flexeril due to insurance issue.   Physical therapy treatment/discharge report reviewed from July 29.  5 visits out of 16.  Was complaining of pain with all activity, difficulty with working all ADLs.  Discharge due to lack of progress and goals were not being met - max benefit of therapy. Did have some worsening of pain with foam roller recommended by chiropracter.   Now notes minimal improvement past 1 week.  Only takes ibuprofen if hurting a lot - 1 or 2 days per week.   No bowel or bladder incontinence, no saddle anesthesia, no lower extremity weakness. No fever, no night sweats or  weight loss.   Still using back brace for work - helps some.    Hyperlipidemia: Not currently on medication.  Recently had repeat lab testing as below.  Does have a history of obesity with increased weight by 3 pounds since last visit.  Body mass index is 40.75 kg/m. Wt Readings from Last 3 Encounters:  01/06/18 260 lb 3.2 oz (118 kg)  11/25/17 257 lb 12.8 oz (116.9 kg)  11/04/17 257 lb 6.4 oz (116.8 kg)    Lab Results  Component Value Date   CHOL 209 (H) 01/01/2018   HDL 34 (L) 01/01/2018   LDLCALC 133 (H) 01/01/2018   TRIG 208 (H) 01/01/2018   CHOLHDL 6.1 (H) 01/01/2018   Prior triglycerides 383.  Lab Results  Component Value Date   ALT 24 01/01/2018   AST 20 01/01/2018   ALKPHOS 97 01/01/2018   BILITOT 0.4 01/01/2018  The 10-year ASCVD risk score Mikey Bussing DC Jr., et al., 2013) is: 8.7%   Values used to calculate the score:     Age: 40 years     Sex: Male     Is Non-Hispanic African American: No     Diabetic: No     Tobacco smoker: No     Systolic Blood Pressure: 784 mmHg     Is BP treated: No     HDL Cholesterol: 34 mg/dL     Total Cholesterol: 209 mg/dL   Spanish  speaking - son translated, (declined Marketing executive)  Patient Active Problem List   Diagnosis Date Noted  . Hypersomnia 11/29/2017  . Other headache syndrome 05/05/2017  . Vertigo 05/05/2017  . IFG (impaired fasting glucose) 08/21/2016  . Obesity (BMI 30-39.9) 06/23/2013  . Stress-induced cardiomyopathy -- essentially resolved   . CVA (cerebral infarction)   . OSA (obstructive sleep apnea)   . Hyperlipidemia   . Physical deconditioning 01/24/2013  . Tinea pedis 01/18/2013  . Essential hypertension 01/13/2013  . 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 ARF (acute renal failure) with tubular necrosis requiring HD 01/11/2013  . Anemia 01/11/2013  . Thrombocytopenia, unspecified ([place]) 01/11/2013  . 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  12/29/2012   Past Medical History:  Diagnosis Date  . 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[place]) 2014   post op   . ARF (acute respiratory failure) ([place]) 2014  . Blood dyscrasia   . Chronic kidney disease 2014   ARF   . Complication of anesthesia 2014   - post op - in combination of pain pills-  Cardiac and Respiratory Arrest- prior to sleep apnea diagonosis  . Constipation   . CVA (cerebral infarction)   . Hyperlipidemia   . Hypertension   . OSA (obstructive sleep apnea)   . Sleep apnea   . Stress-induced cardiomyopathy -- essentially resolved    Post-op Narcotic induced Acute Hypoxic respiratory failure with respiratory and PEA cardiac arrest following accidental narcotic overdose; initial [place] 20-20%, now improved to 55%   Was supported with Impella Cardiac Cath - non-obstructive CAD   . Thrombocytopenia ([place]) 2014   Past Surgical History:  Procedure Laterality Date  . ACHILLES TENDON REPAIR    . CARDIAC CATHETERIZATION  12/29/2012   RN LHC --> Impella. for severe shock with low EF. Mild to moderate RV pressure elevation. Normal coronary arteries. Cardiac Output 2.5, Index 1.46.  Marland Kitchen COLONOSCOPY WITH PROPOFOL N/A 10/30/2015   Procedure: COLONOSCOPY WITH PROPOFOL;  Surgeon: Mauri Pole, MD;  Location: [facility];  Service: Endoscopy;  Laterality: N/A;  . INSERTION OF DIALYSIS CATHETER Right 01/09/2013   Procedure: INSERTION OF DIALYSIS CATHETER;  Surgeon: Angelia Mould, MD;  Location: [facility];  Service: Vascular;  Laterality: Right;  . LEFT AND RIGHT HEART CATHETERIZATION WITH CORONARY ANGIOGRAM  12/29/2012   Procedure: LEFT AND RIGHT HEART CATHETERIZATION WITH CORONARY ANGIOGRAM;  Surgeon: Sherren Mocha, MD;  Location: [facility] CATH LAB;  Service: Cardiovascular;;  . TRANSTHORACIC ECHOCARDIOGRAM  12/29/2012   EF 25% with periapical HK/AKA.  --> Followup echo 7/25 -- EF of 35%  . TRANSTHORACIC ECHOCARDIOGRAM  01/2013 ; 07/2015   a) EF 55-60%. No regional WMA. Grade 1 diastolic  dysfunction. Mild LA dilatation.; b) Normal LV size with mild LV hypertrophy. EF 55-60%. Normal RV   Allergies  Allergen Reactions  . Oxycodone     Oxygen levels drop   Prior to Admission medications   Medication Sig Start Date End Date Taking? Authorizing Provider  aspirin EC 81 MG tablet Take 1 tablet (81 mg total) by mouth daily. 05/13/17  Yes Almyra Deforest, PA   Social History   Socioeconomic History  . Marital status: Married    Spouse name: Not on file  . Number of children: Not on file  . Years of education: Not on file  . Highest education level: Not on file  Occupational History  . Not on file  Social Needs  . Financial resource strain: Not on  file  . Food insecurity:    Worry: Not on file    Inability: Not on file  . Transportation needs:    Medical: Not on file    Non-medical: Not on file  Tobacco Use  . Smoking status: Former Smoker    Last attempt to quit: 06/20/1986    Years since quitting: 31.5  . Smokeless tobacco: Never Used  Substance and Sexual Activity  . Alcohol use: Yes    Comment: History of heavy use but only social at present.  10/29/15 - "a beer every week or so"  . Drug use: No    Comment: Prior cocaine use but none since 2000  . Sexual activity: Never  Lifestyle  . Physical activity:    Days per week: Not on file    Minutes per session: Not on file  . Stress: Not on file  Relationships  . Social connections:    Talks on phone: Not on file    Gets together: Not on file    Attends religious service: Not on file    Active member of club or organization: Not on file    Attends meetings of clubs or organizations: Not on file    Relationship status: Not on file  . Intimate partner violence:    Fear of current or ex partner: Not on file    Emotionally abused: Not on file    Physically abused: Not on file    Forced sexual activity: Not on file  Other Topics Concern  . Not on file  Social History Narrative   He is widowed for 13 years, and has  2 daughters: [name] & [address] [name], and one son Solace Wendorff. the other history is daughter who is here with him today.    He works for [employer] and [employer] as a [employer].   He quit smoking in 1988. He does drink alcohol socially, but he does have a history of heavy use in the past. He also has a prior history of cocaine use but none since 2000.    Review of Systems As above in HPI.       Objective:   Physical Exam  Constitutional: He is oriented to person, place, and time. He appears well-developed and well-nourished.  HENT:  Head: Normocephalic and atraumatic.  Eyes: Pupils are equal, round, and reactive to light. EOM are normal.  Neck: No JVD present. Carotid bruit is not present.  Cardiovascular: Normal rate, regular rhythm and normal heart sounds.  No murmur heard. Pulmonary/Chest: Effort normal and breath sounds normal. He has no rales.  Musculoskeletal: He exhibits no edema.       Thoracic back: He exhibits tenderness (ttp over paraspinals of midback. lumber nontender. ) and spasm. He exhibits no bony tenderness.       Lumbar back: He exhibits no tenderness and no bony tenderness.  Neurological: He is alert and oriented to person, place, and time.  Skin: Skin is warm and dry.  Psychiatric: He has a normal mood and affect.  Vitals reviewed.      Assessment & Plan:   Nicholas Potts is a 57 y.o. male Low back pain without sciatica, unspecified back pain laterality, unspecified chronicity - Plan: Ambulatory referral to Spine Surgery Mid back pain - Plan: Ambulatory referral to Spine Surgery DISH (diffuse idiopathic skeletal hyperostosis) - Plan: Ambulatory referral to Spine Surgery  -Prior x-ray with possible diffuse idiopathic skeletal hyperostosis.  Attempt at physical therapy  with plateau, minimal improvement.  Primarily upper back symptoms at this time, minimal lower back.  Will refer to orthopedic/back specialist to  determine next step as minimal change with physical therapy.  Tylenol or rare ibuprofen as needed for now.  Okay to continue back brace with work if needed.  Hyperlipidemia, unspecified hyperlipidemia type  -Elevated with slightly elevated 10-year ASCVD risk.  plans on diet change/weight loss as initial treatment.  Recheck levels in 3 to 6 months.  No orders of the defined types were placed in this encounter.  Patient Instructions    voy a refirle a doctor de espalda. Tylenol or ibuprofen si necesario.   regrese en 3-6 meses por prueba de sangre.    Dolor de espalda en adultos Back Pain, Adult Muchos adultos sufren dolor de espalda de vez en cuando. Las causas comunes del dolor de espalda son las siguientes:  Ardelia Mems distensin del msculo o el ligamento.  Desgaste (degeneracin) de los discos vertebrales.  Artritis.  Un golpe en la espalda.  El dolor de espalda puede ser breve (agudo) o prolongado (crnico). Es posible que se realicen un examen fsico, anlisis de laboratorio u otros estudios de diagnstico por imgenes para Animator causa del Social research officer, government. Siga estas indicaciones en su casa: Control del dolor y de la rigidez  [employer] medicamentos de venta libre y los recetados solamente como se lo haya indicado el mdico.  Si se lo indican, aplique calor en la zona afectada con la frecuencia que le haya indicado el mdico. Use la fuente de calor que el mdico le recomiende, como una compresa de calor hmedo o una almohadilla trmica. ? Colquese una [employer] piel y la fuente de Freight forwarder. ? Aplique el calor durante 20 a 65mnutos. ? Retire la fuente de calor si la piel se le pone de color rojo brillante. Esto es muy importante si no puede sentir el dolor, el calor ni el fro. Corre un mayor riesgo de sufrir quemaduras.  Si se lo indican, aplique hielo sobre la zona lesionada: ? Ponga el hielo en una bolsa plstica. ? Coloque una tGenuine Partspiel y la bolsa de  hielo. ? Deje el hielo durante 27mutos, de 2 a 3veces por da, durante los primeros 2 o 3das. Actividad  No permanezca en la cama. Si hace reposo ms de 1 a 2 das, puede demorar su recuperacin.  Realice caminatas cortas en superficies planas tan pronto como le sea posible. Trate de caminar un poco ms de tiPublishing copy No se siente, conduzca o permanezca de pie en un mismo lugar durante ms de 30 minutos seguidos. Pararse o sentarse durante largos perodos de tiRadiographer, therapeutica espalda.  Use tcnicas apropiadas para levantar objetos. Cuando se inclina y leChief Executive Officern obNorth Beachutilice posiciones que no sobrecarguen tanto la espalda: ? FlSierra [place]? Mantenga la carga cerca del cuerpo. ? No se tuerza.  Haga actividad fsica habitualmente como se lo haya indicado el mdico. Hacer ejercicio ayudar a que su espalda se cure ms rpido. Adems, esto ayuda a prevenir lesiones de espalda al maPepsiCouertes y flexibles.  El mdico puede recomendarle que consulte a un fisioterapeuta. Esta persona puede ayudarlo a elaborar un programa de ejercicios seguro. Haga ejercicios como se lo haya indicado el fisioterapeuta. Estilo de vida  Mantenga un peso saludable. El sobrepeso sobrecarga la espalda y hace que resulte difcil tener una buena poCenter Point Evite actividades o situaciones que lo hagan sentirse  ansioso o estresado. Aprenda maneras de manejar la ansiedad y el estrs. Una forma de controlar el estrs es a travs del ejercicio. El estrs y la ansiedad aumentan la tensin muscular y pueden empeorar el dolor de espalda. Instrucciones generales  Duerma sobre un colchn firme en una posicin cmoda. Intente acostarse de costado, con las rodillas ligeramente flexionadas. Si se recuesta [employer], coloque una almohada debajo de las rodillas.  Siga el plan de tratamiento como se lo haya indicado el mdico. Esto puede incluir lo siguiente: ? Terapia cognitiva o  conductual. ? Acupuntura o terapia de masajes. ? Yoga o meditacin. Comunquese con un mdico si:  Siente un dolor que no se alivia con reposo o medicamentos.  Siente mucho dolor que se extiende a las piernas o las nalgas.  El dolor no mejora en 2 semanas.  Siente dolor por la noche.  Pierde peso.  Tiene fiebre o siente escalofros. Solicite ayuda de inmediato si:  Tiene nuevos problemas para controlar la vejiga o los intestinos.  Siente debilidad o adormecimiento inusuales en los brazos o en las piernas.  Siente nuseas o vmitos.  Siente dolor abdominal.  Siente que va a desmayarse. Resumen  Muchos adultos sufren dolor de espalda de vez en cuando. Es posible que se realicen un examen fsico, anlisis de laboratorio u otros estudios de diagnstico por imgenes para Animator causa del Social research officer, government.  Use tcnicas apropiadas para levantar objetos. Cuando se inclina y levanta un [place], utilice posiciones que no sobrecarguen tanto la espalda.  Tome los medicamentos de venta libre o recetados y aplique calor o hielo solamente como se lo haya indicado el mdico. Esta informacin no tiene Marine scientist el consejo del mdico. Asegrese de hacerle al mdico cualquier pregunta que tenga. Document Released: 05/25/2005 Document Revised: 09/14/2016 Document Reviewed: 09/14/2016 Elsevier Interactive Patient Education  2018 [place] de alimentos para Management consultant nivel de triglicridos (Food Choices to Lower Your Triglycerides) Los triglicridos son un tipo de grasas que se Chief Strategy Officer. Un nivel elevado de triglicridos puede aumentar el riesgo de padecer enfermedades cardacas e infartos. Si sus niveles de triglicridos son altos, los alimentos que se ingieren y los hbitos de alimentacin son Theatre stage manager. Elegir los alimentos adecuados puede ayudar a Therapist, nutritional de triglicridos. [place]?  Baje de peso si es  necesario.  Limite o evite el alcohol.  Llene la mitad del plato con vegetales y ensaladas de hojas verdes.  Aquilla a dos porciones por da. Elija frutas en lugar de jugos.  Ocupe un cuarto del plato con cereales integrales. Busque la palabra "integral" en Equities trader de la lista de ingredientes.  Llene un cuarto del plato con alimentos con protenas magras.  Disfrute de pescados grasos (como salmn, caballa, sardinas y atn) tres veces por semana.  Friona grasas saludables.  Limite los alimentos con alto contenido de almidn y Location manager.  Consuma ms comida casera y menos de restaurante, de buf y comida rpida.  Limite el consumo de alimentos fritos.  Cocine los alimentos utilizando mtodos que no sean la fritura.  Limite el consumo de grasas saturadas.  Verifique las listas de ingredientes para evitar alimentos con aceites parcialmente hidrogenados (grasas trans).  QU ALIMENTOS PUEDO COMER? Cereales Cereales integrales, como los panes de salvado o [place], las [place], los cereales y las pastas. Avena sin endulzar, trigo, [place], Delfin Gant arroz integral. Tortillas de harina de maz o  de salvado. Vegetales Verduras frescas o congeladas (crudas, al vapor, asadas o grilladas). Ensaladas de hojas verdes. Fruits Frutas frescas, en conserva (en su jugo natural) o frutas congeladas. Carnes y otros productos con protenas Carne de res molida (al 85% o ms [place]), carne de res de animales alimentados con pastos o carne de res sin la grasa. Pollo o pavo sin piel. Carne de pollo o de Nada. Cerdo sin la grasa. Todos los pescados y frutos de mar. Huevos. Porotos, guisantes o lentejas secos. Frutos secos o semillas sin sal. Frijoles secos o en lata sin sal. Lcteos Productos lcteos con bajo contenido de grasas, como [employer] o al 1%, quesos reducidos en grasas o al 2%, ricota con bajo contenido de grasas o [employer]ere & Company, o yogur natural con bajo  contenido de [place]. Grasas y Naval architect en barra que no contengan grasas trans. [place]ones[place] y condimentos para ensaladas livianos o reducidos en grasas. [place]. Aceites de crtamo, oliva o canola. [place] natural de man o almendra. Los artculos mencionados arriba pueden no ser [employer] Foods Company de las bebidas o los alimentos recomendados. Comunquese con el nutricionista para conocer ms opciones. QU ALIMENTOS NO SE RECOMIENDAN? Cereales Pan blanco. Pastas blancas. Arroz blanco. Pan de maz. Bagels, pasteles y croissants. Galletas saladas que contengan grasas trans. Vegetales Papas blancas. Maz. Vegetales con crema o fritos. Verduras en [place]. Fruits Frutas secas. Fruta enlatada en almbar liviano o espeso. Jugo de frutas. Carnes y otros productos con protenas Cortes de carne con Lobbyist. Costillas, alas de pollo, tocineta, salchicha, mortadela, salame, chinchulines, tocino, perros calientes, salchichas alemanas y embutidos envasados. Lcteos Leche entera o al 2%, crema, mezcla de [place] y crema y queso crema. Yogur entero o endulzado. Quesos con toda su grasa. Cremas no lcteas y coberturas batidas. Quesos procesados, quesos para untar o cuajadas. Dulces y postres Jarabe de maz, azcares, miel y Control and instrumentation engineer. Caramelos. Mermelada y [place]. Chrissie Noa. Cereales endulzados. Galletas, pasteles, bizcochuelos, donas, muffins y helado. Grasas y aceites [place], [place] en barra, [place] de [place], [place], [place] clarificada o grasa de tocino. Aceites de coco, de palmiste o de palma. Bebidas Alcohol. Bebidas endulzadas (como refrescos, limonadas y bebidas frutales o ponches). Los artculos mencionados arriba pueden no ser Dean Foods Company de las bebidas y los alimentos que se Higher education careers adviser. Comunquese con el nutricionista para recibir ms informacin. Esta informacin no tiene Marine scientist el consejo del mdico. Asegrese de hacerle al mdico cualquier pregunta que  tenga. Document Released: 11/12/2009 Document Revised: 05/30/2013 Document Reviewed: 03/29/2013 Elsevier Interactive Patient Education  2017 [employer].      IF you received an x-ray today, you will receive an invoice from [facility] Rad[facility]. Please contact [facility] at [phone] with questions or concerns regarding your invoice.   IF you received labwork today, you will receive an invoice from [place]. Please contact [facility] at [phone] with questions or concerns regarding your invoice.   Our billing staff will not be able to assist you with questions regarding bills from these companies.  You will be contacted with the lab results as soon as they are available. The fastest way to get your results is to activate your My Chart account. Instructions are located on the last page of this paperwork. If you have not heard from [place] regarding the results in 2 weeks, please contact this office.       Signed,   Merri Ray, MD [facility].  01/06/18 4:35 PM

## 2018-01-10 ENCOUNTER — Ambulatory Visit: Payer: BLUE CROSS/BLUE SHIELD | Admitting: Physical Therapy

## 2018-01-12 ENCOUNTER — Telehealth: Payer: Self-pay | Admitting: Internal Medicine

## 2018-01-12 NOTE — Telephone Encounter (Signed)
Left message for patient to call back  

## 2018-01-13 NOTE — Telephone Encounter (Signed)
Attempted to call patient today regarding rx for adderall. I did not receive an answer at time of call. I have left a voicemail message for pt to return call. X2

## 2018-01-14 ENCOUNTER — Telehealth: Payer: Self-pay | Admitting: Family Medicine

## 2018-01-14 NOTE — Telephone Encounter (Signed)
Attempted to call patient today regarding rx for adderall. I did not receive an answer at time of call. I have left a voicemail message forptto return call. X3 Per protocol with triage, we have tried contacting pt three times no response. Closing message in triage, will open new message when pt calls back.

## 2018-01-14 NOTE — Telephone Encounter (Signed)
Left a voicemail for Nicholas Potts in regards to his appt he has with Dr. Carlota Raspberry on 04/14/2018. The provider will not be in the office on that day and would need to be rescheduled.

## 2018-01-19 DIAGNOSIS — G4733 Obstructive sleep apnea (adult) (pediatric): Secondary | ICD-10-CM | POA: Diagnosis not present

## 2018-01-27 ENCOUNTER — Ambulatory Visit: Payer: BLUE CROSS/BLUE SHIELD | Admitting: Adult Health

## 2018-02-17 ENCOUNTER — Ambulatory Visit: Payer: BLUE CROSS/BLUE SHIELD | Admitting: Adult Health

## 2018-02-18 DIAGNOSIS — G4733 Obstructive sleep apnea (adult) (pediatric): Secondary | ICD-10-CM | POA: Diagnosis not present

## 2018-03-08 HISTORY — PX: TRANSTHORACIC ECHOCARDIOGRAM: SHX275

## 2018-03-15 ENCOUNTER — Encounter: Payer: Self-pay | Admitting: Cardiology

## 2018-03-15 ENCOUNTER — Encounter

## 2018-03-15 ENCOUNTER — Ambulatory Visit: Payer: BLUE CROSS/BLUE SHIELD | Admitting: Cardiology

## 2018-03-15 VITALS — BP 124/78 | HR 75 | Ht 67.0 in | Wt 259.0 lb

## 2018-03-15 DIAGNOSIS — I469 Cardiac arrest, cause unspecified: Secondary | ICD-10-CM

## 2018-03-15 DIAGNOSIS — I10 Essential (primary) hypertension: Secondary | ICD-10-CM | POA: Diagnosis not present

## 2018-03-15 DIAGNOSIS — G4733 Obstructive sleep apnea (adult) (pediatric): Secondary | ICD-10-CM

## 2018-03-15 DIAGNOSIS — I5181 Takotsubo syndrome: Secondary | ICD-10-CM

## 2018-03-15 DIAGNOSIS — R5382 Chronic fatigue, unspecified: Secondary | ICD-10-CM

## 2018-03-15 DIAGNOSIS — E782 Mixed hyperlipidemia: Secondary | ICD-10-CM

## 2018-03-15 DIAGNOSIS — E669 Obesity, unspecified: Secondary | ICD-10-CM

## 2018-03-15 NOTE — Assessment & Plan Note (Signed)
Resulted with stress-induced cardiomyopathy and encouraging shock temporarily supported with Impella.   Now with persistent fatigue, we will recheck a 2D echocardiogram just to ensure stable improvement EF since initial recovery.

## 2018-03-15 NOTE — Progress Notes (Signed)
PCP: [name], MD  Clinic Note: Chief Complaint  Patient presents with  . Follow-up    History of ischemic cardia myopathy  . Fatigue    HPI: Nicholas Potts is a 57 y.o. male with a PMH below who presents today for ~ 10 month f/u for NICM with h/o Cardiac Arrest/VT in setting of unintentional Narcotic Overdose.  Nicholas Potts was last seen on 05/13/2018  Recent Hospitalizations: NONE  Studies Personally Reviewed - (if available, images/films reviewed: From Epic Chart or Care Everywhere)  NONE  Interval History: Indicate returns here today overall doing very well.  The only adverse effects he has from his hospitalization with cardiac shock and cardiac arrest is some memory loss issues as well as some left leg paresthesia with numbness of the left leg.  He also has pretty significant trouble with sleeping.  He does have CPAP to help him sleep, but he just has a hard time falling asleep and staying asleep.  This has been since his cardiac arrest.  Partially as a result of this issue with sleeping, he has profound fatigue.  He has been evaluated with several labs of the studies that have all been relatively negative and is now being referred back to cardiology for assessment fatigue.  Pulmonary medicine is suggested considering adding Adderall as question about any adverse cardiac effects.  He denies any PND orthopnea.  No resting or exertional dyspnea.  No chest tightness pressure with rest or exertion.  No rapid irregular heartbeats palpitations.  No syncope/near syncope or TIA/amaurosis fugax and no claudication.   ROS: A comprehensive was performed.  Pertinent symptoms noted above Review of Systems  Constitutional: Positive for malaise/fatigue.  HENT: Negative for congestion and nosebleeds.   Respiratory: Negative for cough.   Gastrointestinal: Negative for blood in stool and melena.  Genitourinary: Negative for dysuria and hematuria.  Musculoskeletal:  Negative for joint pain.  Neurological:       Left lateral upper leg numbness since leaving the hospital.  No weakness, just numb  Psychiatric/Behavioral: The patient has insomnia.   All other systems reviewed and are negative.   I have reviewed and (if needed) personally updated the patient's problem list, medications, allergies, past medical and surgical history, social and family history.   Past Medical History:  Diagnosis Date  . 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[place]) 2014   post op   . ARF (acute respiratory failure) ([place]) 2014  . Blood dyscrasia   . Chronic kidney disease 2014   ARF   . Complication of anesthesia 2014   - post op - in combination of pain pills-  Cardiac and Respiratory Arrest- prior to sleep apnea diagonosis  . Constipation   . CVA (cerebral infarction)   . Hyperlipidemia   . Hypertension   . OSA (obstructive sleep apnea)   . Sleep apnea   . Stress-induced cardiomyopathy -- essentially resolved    Post-op Narcotic induced Acute Hypoxic respiratory failure with respiratory and PEA cardiac arrest following accidental narcotic overdose; initial [place] 20-20%, now improved to 55%   Was supported with Impella Cardiac Cath - non-obstructive CAD   . Thrombocytopenia ([place]) 2014    Past Surgical History:  Procedure Laterality Date  . ACHILLES TENDON REPAIR    . CARDIAC CATHETERIZATION  12/29/2012   RN LHC --> Impella. for severe shock with low EF. Mild to moderate RV pressure elevation. Normal coronary arteries. Cardiac Output 2.5, Index 1.46.  Marland Kitchen COLONOSCOPY WITH PROPOFOL N/A 10/30/2015  Procedure: COLONOSCOPY WITH PROPOFOL;  Surgeon: Mauri Pole, MD;  Location: [facility];  Service: Endoscopy;  Laterality: N/A;  . INSERTION OF DIALYSIS CATHETER Right 01/09/2013   Procedure: INSERTION OF DIALYSIS CATHETER;  Surgeon: Angelia Mould, MD;  Location: [facility];  Service: Vascular;  Laterality: Right;  . LEFT AND RIGHT HEART CATHETERIZATION WITH CORONARY ANGIOGRAM   12/29/2012   Procedure: LEFT AND RIGHT HEART CATHETERIZATION WITH CORONARY ANGIOGRAM;  Surgeon: Sherren Mocha, MD;  Location: [facility];  Service: Cardiovascular;;  . TRANSTHORACIC ECHOCARDIOGRAM  12/29/2012   EF 25% with periapical HK/AKA.  --> Followup echo 7/25 -- EF of 35%  . TRANSTHORACIC ECHOCARDIOGRAM  01/2013 ; 07/2015   a) EF 55-60%. No regional WMA. Grade 1 diastolic dysfunction. Mild LA dilatation.; b) Normal LV size with mild LV hypertrophy. EF 55-60%. Normal RV    Current Meds  Medication Sig  . aspirin EC 81 MG tablet Take 1 tablet (81 mg total) by mouth daily.    Allergies  Allergen Reactions  . Oxycodone     Oxygen levels drop    Social History   Tobacco Use  . Smoking status: Former Smoker    Last attempt to quit: 06/20/1986    Years since quitting: 31.7  . Smokeless tobacco: Never Used  Substance Use Topics  . Alcohol use: Yes    Comment: History of heavy use but only social at present.  10/29/15 - "a beer every week or so"  . Drug use: No    Comment: Prior cocaine use but none since 2000   Social History   Social History Narrative   He is widowed for 13 years, and has 2 daughters: [name] & [address] [name], and one son Draper Gallon. the other history is daughter who is here with him today.    He works for [employer] as a [employer].   He quit smoking in 1988. He does drink alcohol socially, but he does have a history of heavy use in the past. He also has a prior history of cocaine use but none since 2000.    family history includes Cancer in his father, maternal grandmother, mother, and paternal grandmother.  Wt Readings from Last 3 Encounters:  03/15/18 259 lb (117.5 kg)  01/06/18 260 lb 3.2 oz (118 kg)  11/25/17 257 lb 12.8 oz (116.9 kg)    PHYSICAL EXAM BP 124/78 (BP Location: Left Arm, Patient Position: Sitting, Cuff Size: Large)   Pulse 75   Ht 5\' 7"  (1.702 m)   Wt 259 lb (117.5 kg)   BMI  40.57 kg/m  Physical Exam  Constitutional: He appears well-developed and well-nourished. No distress.  Morbidly obese.  Otherwise well-groomed.  HENT:  Head: Normocephalic and atraumatic.  Neck: Normal range of motion. Neck supple. No hepatojugular reflux and no JVD (Difficult to assess due to body habitus.) present. Carotid bruit is not present.  Cardiovascular: Normal rate, regular rhythm, normal heart sounds, intact distal pulses and normal pulses.  No extrasystoles are present. PMI is not displaced (Difficult to palpate). Exam reveals no gallop and no friction rub.  No murmur heard. Pulmonary/Chest: Effort normal and breath sounds normal. No respiratory distress. He has no wheezes.  Abdominal: Soft. Bowel sounds are normal. He exhibits no distension. There is no tenderness. There is no rebound.  No HSM.  Difficult to assess due to obesity  Musculoskeletal: Normal range of motion. He exhibits no edema.  Skin: Skin is  warm and dry.  Psychiatric: He has a normal mood and affect. His behavior is normal. Judgment and thought content normal.  Vitals reviewed.    Adult ECG Report  Rate: 75;  Rhythm: normal sinus rhythm and Otherwise normal axis, intervals and durations.;   Narrative Interpretation: Normal EKG  Other studies Reviewed: Additional studies/ records that were reviewed today include:  Recent Labs: No labs available since 2017.  Supposed to be checked by PCP.   ASSESSMENT / PLAN: Problem List Items Addressed This Visit    Essential hypertension (Chronic)    Probably not a true diagnosis since he has relatively normal blood pressures with no meds.      Relevant Orders   EKG 12-Lead   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    Resulted with stress-induced cardiomyopathy and encouraging shock temporarily supported with Impella.   Now with persistent fatigue, we will recheck a 2D echocardiogram just to ensure stable  improvement EF since initial recovery.      Relevant Orders   EKG 12-Lead   ECHOCARDIOGRAM COMPLETE   Hyperlipidemia (Chronic)    Previously on statin.  No longer on anything besides aspirin.  LDL 133.  With obesity as retractor, would probably try to shoot for LDL close to 100.  Will defer to primary provider threshold to consider restarting statin      Relevant Orders   ECHOCARDIOGRAM COMPLETE   Obesity (BMI 30-39.9) (Chronic)    The patient understands the need to lose weight with diet and exercise. We have discussed specific strategies for this.      OSA (obstructive sleep apnea) (Chronic)    Tolerating CPAP, but not sleeping well.  I am not sure if his OSA with insomnia could be partially the main reasons for his chronic fatigue.  Defer to pulmonary medicine.      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  Now with fatigue.  Previously had recovered back to normal EF, however would now having persistent fatigue will reassess echo to ensure stable EF.  Plan: 2 D Echo      Relevant Orders   EKG 12-Lead   ECHOCARDIOGRAM COMPLETE    Other Visit Diagnoses    Chronic fatigue  (Chronic)   -  Primary   Relevant Orders   EKG 12-Lead   ECHOCARDIOGRAM COMPLETE      Current medicines are reviewed at length with the patient today.  (+/- concerns) n/a The following changes have been made:  n/a  Patient Instructions  Medication Instructions:  NONE  If you need a refill on your cardiac medications before your next appointment, please call your pharmacy.   Testing/Procedures: Your physician has requested that you have an echocardiogram.   Studies Ordered:   Orders Placed This Encounter  Procedures  . EKG 12-Lead  . ECHOCARDIOGRAM COMPLETE      Glenetta Hew, M.D., M.S. Interventional Cardiologist    Pager # [phone] Phone # [phone] [address] [place], [place] [address]   Thank you for choosing [facility] at [facility]!!

## 2018-03-15 NOTE — Assessment & Plan Note (Signed)
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  Now with fatigue.  Previously had recovered back to normal EF, however would now having persistent fatigue will reassess echo to ensure stable EF.  Plan: 2 D Echo

## 2018-03-15 NOTE — Assessment & Plan Note (Signed)
The patient understands the need to lose weight with diet and exercise. We have discussed specific strategies for this.  

## 2018-03-15 NOTE — Assessment & Plan Note (Signed)
Tolerating CPAP, but not sleeping well.  I am not sure if his OSA with insomnia could be partially the main reasons for his chronic fatigue.  Defer to pulmonary medicine.

## 2018-03-15 NOTE — Patient Instructions (Signed)
Medication Instructions:  NONE  If you need a refill on your cardiac medications before your next appointment, please call your pharmacy.   Lab work: NONE  If you have labs (blood work) drawn today and your tests are completely normal, you will receive your results only by: Marland Kitchen MyChart Message (if you have MyChart) OR . A paper copy in the mail If you have any lab test that is abnormal or we need to change your treatment, we will call you to review the results.  Testing/Procedures: Your physician has requested that you have an echocardiogram. Echocardiography is a painless test that uses sound waves to create images of your heart. It provides your doctor with information about the size and shape of your heart and how well your heart's chambers and valves are working. This procedure takes approximately one hour. There are no restrictions for this procedure.  Follow-Up: At [facility], you and your health needs are our priority.  As part of our continuing mission to provide you with exceptional heart care, we have created designated Provider Care Teams.  These Care Teams include your primary Cardiologist (physician) and Advanced Practice Providers (APPs -  Physician Assistants and Nurse Practitioners) who all work together to provide you with the care you need, when you need it. You will need a follow up appointment in 2 months with Dr. Ellyn Hack unless normal results. Or one of the following Advanced Practice Providers on your designated Care Team:   Rosaria Ferries, PA-C . Jory Sims, DNP, ANP  Any Other Special Instructions Will Be Listed Below (If Applicable). We will call you with the results of your test.

## 2018-03-15 NOTE — Assessment & Plan Note (Signed)
Previously on statin.  No longer on anything besides aspirin.  LDL 133.  With obesity as retractor, would probably try to shoot for LDL close to 100.  Will defer to primary provider threshold to consider restarting statin

## 2018-03-15 NOTE — Assessment & Plan Note (Signed)
Probably not a true diagnosis since he has relatively normal blood pressures with no meds.

## 2018-03-31 ENCOUNTER — Other Ambulatory Visit: Payer: Self-pay

## 2018-03-31 ENCOUNTER — Ambulatory Visit (HOSPITAL_COMMUNITY): Payer: BLUE CROSS/BLUE SHIELD | Attending: Cardiology

## 2018-03-31 DIAGNOSIS — G4733 Obstructive sleep apnea (adult) (pediatric): Secondary | ICD-10-CM | POA: Insufficient documentation

## 2018-03-31 DIAGNOSIS — I469 Cardiac arrest, cause unspecified: Secondary | ICD-10-CM

## 2018-03-31 DIAGNOSIS — I5181 Takotsubo syndrome: Secondary | ICD-10-CM | POA: Diagnosis not present

## 2018-03-31 DIAGNOSIS — R5382 Chronic fatigue, unspecified: Secondary | ICD-10-CM | POA: Insufficient documentation

## 2018-03-31 DIAGNOSIS — E782 Mixed hyperlipidemia: Secondary | ICD-10-CM | POA: Diagnosis not present

## 2018-04-14 ENCOUNTER — Ambulatory Visit: Payer: BLUE CROSS/BLUE SHIELD | Admitting: Family Medicine

## 2018-05-19 ENCOUNTER — Ambulatory Visit: Payer: BLUE CROSS/BLUE SHIELD | Admitting: Cardiology

## 2018-05-19 DIAGNOSIS — G4733 Obstructive sleep apnea (adult) (pediatric): Secondary | ICD-10-CM | POA: Diagnosis not present

## 2018-08-17 DIAGNOSIS — G4733 Obstructive sleep apnea (adult) (pediatric): Secondary | ICD-10-CM | POA: Diagnosis not present

## 2018-11-25 ENCOUNTER — Encounter: Payer: Self-pay | Admitting: Gastroenterology

## 2018-12-22 ENCOUNTER — Telehealth: Payer: Self-pay | Admitting: *Deleted

## 2018-12-22 NOTE — Telephone Encounter (Signed)
===  View-only below this line=== ----- Message ----- From: Larina Bras, CMA Sent: 11/30/2018   2:22 PM EDT To: Oda Kilts, CMA  Patient needs hospital recall colonoscopy for history of tubular adenoma 2017. Hospital case needed since patient has history of obstructive sleep apnea and PEA in the setting of narcotic overdose 2014. In addition, you may need one of our PCC's to help with interpretation as patient speaks [place]. Thanks

## 2019-01-10 NOTE — Telephone Encounter (Signed)
Nicholas Potts, Can you call this patient for me and see if he can have his colonoscopy on 8-14 at [facility]  I don't know the time yet we will have to put it on his instructions,  He is spanish speaking and need an interpreter  Thanks!!

## 2019-01-10 NOTE — Telephone Encounter (Signed)
Left message in both contact numbers to call back.

## 2019-02-28 NOTE — Telephone Encounter (Signed)
Patient has never returned call to schedule

## 2019-03-24 ENCOUNTER — Telehealth: Payer: Self-pay | Admitting: Gastroenterology

## 2019-03-24 NOTE — Telephone Encounter (Signed)
This pt requested to schedule his hospital colon in January 2021.

## 2019-03-24 NOTE — Telephone Encounter (Signed)
We do not have a schedule for January. Can you call him and let him know he will need to reach out to [place] in December?

## 2019-05-18 ENCOUNTER — Other Ambulatory Visit: Payer: Self-pay

## 2019-05-18 DIAGNOSIS — Z20822 Contact with and (suspected) exposure to covid-19: Secondary | ICD-10-CM

## 2019-05-20 LAB — NOVEL CORONAVIRUS, NAA: SARS-CoV-2, NAA: NOT DETECTED

## 2019-05-30 ENCOUNTER — Telehealth: Payer: Self-pay | Admitting: Gastroenterology

## 2019-05-30 ENCOUNTER — Other Ambulatory Visit: Payer: Self-pay

## 2019-05-30 NOTE — Telephone Encounter (Signed)
Pt's daughter called to schedule recall hospital colon.

## 2019-05-30 NOTE — Telephone Encounter (Signed)
Will not be able to bring him in for the Pre-visit. No openings before the procedure date. Are they comfortable with mailed instructions? If yes, then COVID screening on 06/16/19 at 9:10 am (time can be changed but not date) Colonoscopy at [facility] on 06/20/19 arrive at 11:45 am.  No answer. Left a message asking she call back.

## 2019-05-31 ENCOUNTER — Other Ambulatory Visit: Payer: Self-pay

## 2019-05-31 MED ORDER — NA SULFATE-K SULFATE-MG SULF 17.5-3.13-1.6 GM/177ML PO SOLN: ORAL | 0 refills | Status: DC

## 2019-05-31 NOTE — Telephone Encounter (Signed)
Left message to confirm this will work. I will mail out his instructions today.

## 2019-05-31 NOTE — Telephone Encounter (Signed)
Discussed with the daughter. The patient will go back to work on 06/12/19. He will not know until then if he can have the time off for the procedure. She will let us know asap.

## 2019-06-14 ENCOUNTER — Other Ambulatory Visit: Payer: Self-pay

## 2019-06-14 ENCOUNTER — Telehealth: Payer: Self-pay | Admitting: Gastroenterology

## 2019-06-14 NOTE — Progress Notes (Signed)
Attempted to obtain medical history via telephone, unable to reach at this time. I left a voicemail to return pre surgical testing department's phone call. Used [employer] ID # [id number].

## 2019-06-14 NOTE — Telephone Encounter (Signed)
Spoke with the daughter Verdis Frederickson. Patient was not allowed the time off by his employer for the colonoscopy. He must rescheduled. Agrees to Covid screening on 07/28/19 at 3:10pm. Colonoscopy at [facility] on 08/01/19 arrive at 9:00am for a 10:30am procedure start time. New instructions will be put into the My Chart for his review.

## 2019-06-14 NOTE — Telephone Encounter (Signed)
Pt's daughter Verdis Frederickson needs to r/s pt's procedure at the hospital. Pls leave a vm if she does not answer. She stated that vm is set up for her work but it is her personal phone.

## 2019-06-15 ENCOUNTER — Encounter: Payer: Self-pay | Admitting: Gastroenterology

## 2019-06-16 ENCOUNTER — Other Ambulatory Visit (HOSPITAL_COMMUNITY): Payer: BLUE CROSS/BLUE SHIELD

## 2019-07-12 ENCOUNTER — Other Ambulatory Visit: Payer: Self-pay

## 2019-07-12 ENCOUNTER — Telehealth (INDEPENDENT_AMBULATORY_CARE_PROVIDER_SITE_OTHER): Payer: BLUE CROSS/BLUE SHIELD | Admitting: Family Medicine

## 2019-07-12 VITALS — Wt 264.0 lb

## 2019-07-12 DIAGNOSIS — R5383 Other fatigue: Secondary | ICD-10-CM

## 2019-07-12 DIAGNOSIS — R002 Palpitations: Secondary | ICD-10-CM

## 2019-07-12 DIAGNOSIS — G4733 Obstructive sleep apnea (adult) (pediatric): Secondary | ICD-10-CM

## 2019-07-12 DIAGNOSIS — R519 Headache, unspecified: Secondary | ICD-10-CM

## 2019-07-12 DIAGNOSIS — Z9989 Dependence on other enabling machines and devices: Secondary | ICD-10-CM

## 2019-07-12 NOTE — Patient Instructions (Addendum)
I will refer you to sleep specialist, but would like to evaluate the fatigue and your heart palpitations further in office within the next few days.  Make sure to drink plenty of fluids.  If any acute worsening of symptoms or any associated chest pains, shortness of breath or feeling like you are going to faint, call 911 or go to the emergency room.     If you have lab work done today you will be contacted with your lab results within the next 2 weeks.  If you have not heard from [place] then please contact us. The fastest way to get your results is to register for My Chart.   IF you received an x-ray today, you will receive an invoice from [facility]. Please contact [facility] at [phone] with questions or concerns regarding your invoice.   IF you received labwork today, you will receive an invoice from [facility]. Please contact [facility] at [phone] with questions or concerns regarding your invoice.   Our billing staff will not be able to assist you with questions regarding bills from these companies.  You will be contacted with the lab results as soon as they are available. The fastest way to get your results is to activate your My Chart account. Instructions are located on the last page of this paperwork. If you have not heard from [place] regarding the results in 2 weeks, please contact this office.

## 2019-07-12 NOTE — Progress Notes (Signed)
Virtual Visit via Video Note  I connected with Nicholas Potts on 07/12/19 at 9:18 PM by a video enabled telemedicine application [employer]  and verified that I am speaking with the correct person using two identifiers.   I discussed the limitations, risks, security and privacy concerns of performing an evaluation and management service by telephone and the availability of in person appointments. I also discussed with the patient that there may be a patient responsible charge related to this service. The patient expressed understanding and agreed to proceed, consent obtained.  Son on call as well to translate - consent given.   Chief complaint:  Chief Complaint  Patient presents with  . Fatigue    Every morning when patient gets up he have headaches, fatigue, and tired. Patient stated he felt this same way before he was put on cpap machine back in 2014. Patient think he may just need a cpap adjustment. Not sure if he need to have another referral for sleep study or can we just order for adjustment.     History of Present Illness: Nicholas Potts is a 59 y.o. male   Obstructive Sleep apnea.  Hx of OSA. Noted recently to have similar symptoms as before he started CPAP. Headache in the morning, feeling fatigued/tired. Past month.  Using CPAP nightly.  AHI 79/hr on NPSG in 02/20/13 based on pulmonary note with Dr. Annamaria Boots in 11/2017. autopap 5-20, AHI 0.8/hr, 100% compliance on download at that time. [employer].   No fever, no dark stools/blood in stools.  More fatigue, HA in front of head, improves during the day.  Last lab work in 2019 - normal TSH, CBC, CMP.  No syncope/near syncope No focal weakness, chest pains, rare heart palpitations - not currently - had yesterday - feels like not breathing properly when this occurs. Heart going slower.   No change in taste/smell, no cough, no covid exposures known.        Patient Active Problem List   Diagnosis Date Noted  . Hypersomnia  11/29/2017  . Other headache syndrome 05/05/2017  . Vertigo 05/05/2017  . IFG (impaired fasting glucose) 08/21/2016  . Obesity (BMI 30-39.9) 06/23/2013  . Stress-induced cardiomyopathy -- essentially resolved   . CVA (cerebral infarction)   . OSA (obstructive sleep apnea)   . Hyperlipidemia   . Physical deconditioning 01/24/2013  . Tinea pedis 01/18/2013  . Essential hypertension 01/13/2013  . 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 ARF (acute renal failure) with tubular necrosis requiring HD 01/11/2013  . Anemia 01/11/2013  . Thrombocytopenia, unspecified ([place]) 01/11/2013  . 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[place]) 2014   post op   . ARF (acute respiratory failure) ([place]) 2014  . Blood dyscrasia   . Chronic kidney disease 2014   ARF   . Complication of anesthesia 2014   - post op - in combination of pain pills-  Cardiac and Respiratory Arrest- prior to sleep apnea diagonosis  . Constipation   . CVA (cerebral infarction)   . Hyperlipidemia   . Hypertension   . OSA (obstructive sleep apnea)   . Sleep apnea   . Stress-induced cardiomyopathy -- essentially resolved    Post-op Narcotic induced Acute Hypoxic respiratory failure with respiratory and PEA cardiac arrest following accidental narcotic overdose; initial [place] 20-20%, now improved to 55%   Was supported with Impella Cardiac Cath -  non-obstructive CAD   . Thrombocytopenia ([place]) 2014   Past Surgical History:  Procedure Laterality Date  . ACHILLES TENDON REPAIR    . CARDIAC CATHETERIZATION  12/29/2012   RN LHC --> [facility]. for severe shock with low EF. Mild to moderate RV pressure elevation. Normal coronary arteries. Cardiac Output 2.5, Index 1.46.  Marland Kitchen COLONOSCOPY WITH PROPOFOL N/A 10/30/2015   Procedure: COLONOSCOPY WITH PROPOFOL;  Surgeon: Mauri Pole, MD;  Location: [facility];  Service: Endoscopy;  Laterality: N/A;  . INSERTION OF DIALYSIS CATHETER Right 01/09/2013   Procedure: INSERTION OF DIALYSIS CATHETER;  Surgeon: Angelia Mould, MD;  Location: [facility];  Service: Vascular;  Laterality: Right;  . LEFT AND RIGHT HEART CATHETERIZATION WITH CORONARY ANGIOGRAM  12/29/2012   Procedure: LEFT AND RIGHT HEART CATHETERIZATION WITH CORONARY ANGIOGRAM;  Surgeon: Sherren Mocha, MD;  Location: [facility] CATH LAB;  Service: Cardiovascular;;  . TRANSTHORACIC ECHOCARDIOGRAM  12/29/2012   EF 25% with periapical HK/AKA.  --> Followup echo 7/25 -- EF of 35%  . TRANSTHORACIC ECHOCARDIOGRAM  01/2013 ; 07/2015   a) EF 55-60%. No regional WMA. Grade 1 diastolic dysfunction. Mild LA dilatation.; b) Normal LV size with mild LV hypertrophy. EF 55-60%. Normal RV   Allergies  Allergen Reactions  . Oxycodone     Oxygen levels drop   Prior to Admission medications   Medication Sig Start Date End Date Taking? Authorizing Provider  aspirin EC 81 MG tablet Take 1 tablet (81 mg total) by mouth daily. 05/13/17  Yes Meng, Isaac Laud, PA  Na Sulfate-K Sulfate-Mg Sulf 17.5-3.13-1.6 GM/177ML SOLN Follow instructions from the doctor 05/31/19  Yes Mauri Pole, MD   Social History   Socioeconomic History  . Marital status: Married    Spouse name: Not on file  . Number of children: Not on file  . Years of education: Not on file  . Highest education level: Not on file  Occupational History  . Not on file  Tobacco Use  . Smoking status: Former Smoker    Quit date: 06/20/1986    Years since quitting: 33.0  . Smokeless tobacco: Never Used  Substance and Sexual Activity  . Alcohol use: Yes    Comment: History of heavy use but only social at present.  10/29/15 - "a beer every week or so"  . Drug use: No    Comment: Prior cocaine use but none since 2000  . Sexual activity: Never  Other Topics Concern  . Not on file  Social History Narrative   He is widowed for 13 years, and has 2  daughters: [name] & [address] [name], and one son [name]. the other history is daughter who is here with him today.    He works for [employer] as a [employer].   He quit smoking in 1988. He does drink alcohol socially, but he does have a history of heavy use in the past. He also has a prior history of cocaine use but none since 2000.   Social Determinants of Health   Financial Resource Strain:   . Difficulty of Paying Living Expenses: Not on file  Food Insecurity:   . Worried About Charity fundraiser in the Last Year: Not on file  . Ran Out of Food in the Last Year: Not on file  Transportation Needs:   . Lack of Transportation (Medical): Not on file  . Lack of Transportation (Non-Medical): Not on file  Physical Activity:   .  Days of Exercise per Week: Not on file  . Minutes of Exercise per Session: Not on file  Stress:   . Feeling of Stress : Not on file  Social Connections:   . Frequency of Communication with Friends and Family: Not on file  . Frequency of Social Gatherings with Friends and Family: Not on file  . Attends Religious Services: Not on file  . Active Member of Clubs or Organizations: Not on file  . Attends Archivist Meetings: Not on file  . Marital Status: Not on file  Intimate Partner Violence:   . Fear of Current or Ex-Partner: Not on file  . Emotionally Abused: Not on file  . Physically Abused: Not on file  . Sexually Abused: Not on file    Observations/Objective: Vitals:   07/12/19 1603  Weight: 264 lb (119.7 kg)   No distress, normal speech.  Appropriate responses with understanding of plan expressed.  Assessment and Plan: Fatigue, unspecified type  Heart palpitations  OSA on CPAP - Plan: Ambulatory referral to Sleep Studies  Acute nonintractable headache, unspecified headache type  History of obstructive sleep apnea on CPAP.  Potentially may need updated study but with AutoPap  should be sufficient treatment.  Recently has noticed some palpitations with associated dyspnea, arrhythmia possible contributor to fatigue.  Asymptomatic at present.  -In office eval in 2 days with EKG, likely lab work.  ER/911 precautions given during that time.   -Refer to sleep specialist for consideration of updated testing, I did recommend download of compliance report from his CPAP machine to review at office visit in 2 days.   Follow Up Instructions:   2 days in office.  I discussed the assessment and treatment plan with the patient. The patient was provided an opportunity to ask questions and all were answered. The patient agreed with the plan and demonstrated an understanding of the instructions.   The patient was advised to call back or seek an in-person evaluation if the symptoms worsen or if the condition fails to improve as anticipated.  I provided 17 minutes of non-face-to-face time during this encounter.   [name], MD

## 2019-07-13 ENCOUNTER — Other Ambulatory Visit: Payer: Self-pay

## 2019-07-13 ENCOUNTER — Ambulatory Visit: Payer: BLUE CROSS/BLUE SHIELD | Admitting: Family Medicine

## 2019-07-13 ENCOUNTER — Encounter: Payer: Self-pay | Admitting: Family Medicine

## 2019-07-13 VITALS — BP 115/76 | HR 87 | Temp 98.6°F | Ht 67.0 in | Wt 265.0 lb

## 2019-07-13 DIAGNOSIS — R5383 Other fatigue: Secondary | ICD-10-CM | POA: Diagnosis not present

## 2019-07-13 DIAGNOSIS — E785 Hyperlipidemia, unspecified: Secondary | ICD-10-CM | POA: Diagnosis not present

## 2019-07-13 DIAGNOSIS — G4733 Obstructive sleep apnea (adult) (pediatric): Secondary | ICD-10-CM

## 2019-07-13 DIAGNOSIS — Z9989 Dependence on other enabling machines and devices: Secondary | ICD-10-CM

## 2019-07-13 DIAGNOSIS — R002 Palpitations: Secondary | ICD-10-CM

## 2019-07-13 LAB — POCT CBC
Granulocyte percent: 72.8 %G (ref 37–80)
HCT, POC: 45.2 % — AB (ref 29–41)
Hemoglobin: 15.3 g/dL — AB (ref 11–14.6)
Lymph, poc: 1.7 (ref 0.6–3.4)
MCH, POC: 32.4 pg — AB (ref 27–31.2)
MCHC: 33.9 g/dL (ref 31.8–35.4)
MCV: 95.6 fL (ref 76–111)
MID (cbc): 0.7 (ref 0–0.9)
MPV: 6.5 fL (ref 0–99.8)
POC Granulocyte: 6.5 (ref 2–6.9)
POC LYMPH PERCENT: 19.2 %L (ref 10–50)
POC MID %: 8 %M (ref 0–12)
Platelet Count, POC: 308 10*3/uL (ref 142–424)
RBC: 4.73 M/uL (ref 4.69–6.13)
RDW, POC: 13.3 %
WBC: 8.9 10*3/uL (ref 4.6–10.2)

## 2019-07-13 LAB — GLUCOSE, POCT (MANUAL RESULT ENTRY): POC Glucose: 71 mg/dl (ref 70–99)

## 2019-07-13 NOTE — Patient Instructions (Addendum)
I will refer you back to cardiology for fatigue and new heart palpitations, and sent sleep referral as well. Download looks ok.  Return to the clinic or go to the nearest emergency room if any of your symptoms worsen or new symptoms occur.  Here is a link to information about the COVID-19 vaccine: RecruitSuit.ca COVID-19 Vaccine Information can be found at: ShippingScam.co.uk For questions related to vaccine distribution or appointments, please email [email or web address] or call [phone].    Fatiga Fatigue Si siente fatiga, tiene una sensacin de cansancio en todo momento y falta de energa o falta de motivacin. La fatiga puede dificultar el inicio o la realizacin de tareas debido al agotamiento. Por lo general, la fatiga ocasional o leve con frecuencia es una reaccin normal a la actividad o la vida. Sin embargo, la fatiga de Engineer, site duracin (crnica) o extrema puede ser sntoma de una enfermedad. Siga estas indicaciones en su casa: Instrucciones generales  Controle su fatiga para ver si hay cambios.  Acustese y levntese a la misma hora todos [place].  Evite la fatiga moderando su ritmo [place] y durmiendo lo suficiente durante la noche.  Mantenga un peso saludable. [place] los medicamentos de venta libre y los recetados solamente como se lo haya indicado el mdico.  Tome una multivitamina, si se lo indic el mdico.  No tome ningn complemento alimenticio o a base de hierbas a menos que lo haya autorizado el mdico. Actividad   Realice ejercicio con regularidad como se lo haya indicado el mdico.  Practique tcnicas que lo ayuden a Nurse, children's, como yoga, tai chi, meditacin, terapia de masajes. Comida y bebida   Evite las comidas abundantes a la noche.  Consuma una dieta bien balanceada que incluya protenas magras, cereales integrales, abundantes frutas, verduras y  productos lcteos descremados.  Evite el consumo excesivo de cafena.  Evite el consumo de alcohol.  Beba suficiente lquido para Consulting civil engineer orina de color amarillo plido. [place] situaciones que le provocan estrs. Trate de que su horario de [place] y personal estn equilibrados.  No consuma ningn producto que contenga nicotina o tabaco, como cigarrillos y Psychologist, sport and exercise. Si necesita ayuda para dejar de fumar, consulte al mdico.  No consuma drogas. Comunquese con un mdico si:  La fatiga no mejora.  Tiene fiebre.  Pierde peso o [place] de peso de forma repentina.  Tiene dolores de [place].  Tiene problemas para conciliar el sueo o para dormir en la noche.  Se siente enojado, culpable, ansioso o triste.  No puede defecar (estreimiento).  Tiene la piel seca.  Observa hinchazn en las piernas u otras partes del cuerpo. Solicite ayuda de inmediato si:  Se siente confundido.  Tiene visin borrosa.  Tiene mareos o se desmaya.  Tiene un dolor de cabeza intenso.  Siente dolor intenso en el abdomen, la espalda o el rea entre la cintura y las caderas (pelvis).  Tiene dolor de pecho, dificultad para respirar, o latidos cardacos irregulares o acelerados.  No puede orinar u [employer] de lo normal.  Presenta sangrado anormal, como sangrado del recto, la vagina, la nariz, los pulmones o los pezones.  Vomita sangre.  Tiene pensamientos acerca de lastimarse a usted mismo o a Producer, television/film/video. Si alguna vez siente que puede lastimarse o Market researcher a los dems, o tiene pensamientos de poner fin a su vida, busque ayuda de inmediato. Puede dirigirse al servicio de emergencias ms cercano o comunicarse con:  El servicio de  emergencias local (911 en los [place]).  Una lnea de asistencia al suicida y Freight forwarder en crisis, como la [employer] de Prevencin del Suicidio (National Suicide Prevention Lifeline) al [phone]. Est  disponible las 24 horas del da. Resumen  Si siente fatiga, tiene una sensacin de cansancio en todo momento y falta de energa o falta de motivacin.  La fatiga puede dificultar el inicio o la realizacin de tareas debido al agotamiento.  La fatiga de larga duracin (crnica) o extrema puede ser sntoma de una enfermedad.  Realice ejercicio con regularidad como se lo haya indicado el mdico.  Cambie las situaciones que le provocan estrs. Trate de que su horario de [place] y personal estn equilibrados. Esta informacin no tiene Marine scientist el consejo del mdico. Asegrese de hacerle al mdico cualquier pregunta que tenga. Document Revised: 05/12/2017 Document Reviewed: 05/12/2017 Elsevier Patient Education  [place] son sensaciones de que su latido cardaco es irregular o ms rpido de lo normal. Se siente como un aleteo o que falta un latido. Generalmente no es un problema grave. Las causas de las palpitaciones pueden ser diversas, entre ellas, el estrs, y el consumo de cigarrillos, cafena, alcohol y determinados medicamentos o drogas. La mayora de las causas de las palpitaciones no son graves. Sin embargo, algunas palpitaciones pueden ser un signo de un problema grave. Tal vez necesite otros estudios para descartar algn problema mdico grave. Siga estas indicaciones en su casa:     Est atento a cualquier cambio en su afeccin. Tome estas medidas para ayudar a Chief Technology Officer sus sntomas: Network engineer comer y beber  [employer] alimentos y las bebidas que pueden provocar palpitaciones. Estos pueden incluir lo siguiente: ? Bebidas que contengan cafena como el caf, el t, los refrescos, las pastillas para Horticulturist, commercial y las bebidas energizantes. ? Chocolate. ? Alcohol. [place] medidas para reducir los niveles de estrs y la ansiedad. Algunas cosas que pueden ayudarlo a relajarse son: ? Yoga. ? Actividades para  el cuerpo y la mente, como respiracin profunda, meditacin o usar palabras e imgenes para crear pensamientos positivos (visualizacin guiada). ? Actividad fsica como natacin, trote o caminatas. Informe a su mdico si las palpitaciones aumentan con la [place]. Si la [employer] de pecho o falta de aire, no contine la actividad hasta que su mdico lo examine. ? Biorretroalimentacin. Este es un mtodo que le ensea a usar la mente para Chief Technology Officer cosas del cuerpo, como el latido cardaco.  No use drogas, entre ellas la cocana o el xtasis. No use marihuana.  Descanse y duerma lo suficiente. Mantenga un horario habitual para acostarse. Instrucciones generales  [employer] de venta libre y los recetados solamente como se lo haya indicado el mdico.  No consuma ningn producto que contenga nicotina o tabaco, como cigarrillos y Psychologist, sport and exercise. Si necesita ayuda para dejar de fumar, consulte al mdico.  Concurra a todas las visitas de seguimiento como se lo haya indicado el mdico. Esto es importante. Estas pueden incluir visitas para realizarle ms estudios si las palpitaciones no desaparecen o empeoran. Comunquese con un mdico si:  Contina con latidos cardacos rpidos o irregulares despus de 24 horas.  Observa que las palpitaciones ocurren con ms frecuencia. Solicite ayuda inmediatamente si:  Insurance account manager o le falta el aire.  Tiene un dolor de cabeza intenso.  Se siente mareado o se desmaya. Resumen  Las palpitaciones son sensaciones de  que su latido cardaco es irregular o ms rpido de lo normal. Se siente como un aleteo o que falta un latido.  [place] palpitaciones pueden ser diversas, entre ellas, el estrs y el consumo de cigarrillos, cafena, alcohol y determinadas drogas.  Si bien la [employer] de las causas de las palpitaciones no son graves, algunas pueden ser un signo de un problema mdico  grave.  Obtenga ayuda de inmediato si se desmaya o tiene dolor de [place], falta de [place], dolor de cabeza intenso o [employer]. Esta informacin no tiene Marine scientist el consejo del mdico. Asegrese de hacerle al mdico cualquier pregunta que tenga. Document Revised: 09/06/2017 Document Reviewed: 09/06/2017 Elsevier Patient Education  [place] [employer].    If you have lab work done today you will be contacted with your lab results within the next 2 weeks.  If you have not heard from [place] then please contact us. The fastest way to get your results is to register for My Chart.   IF you received an x-ray today, you will receive an invoice from [facility] Radiology. Please contact [facility] Radiology at [phone] with questions or concerns regarding your invoice.   IF you received labwork today, you will receive an invoice from [place]. Please contact [facility] at [phone] with questions or concerns regarding your invoice.   Our billing staff will not be able to assist you with questions regarding bills from these companies.  You will be contacted with the lab results as soon as they are available. The fastest way to get your results is to activate your My Chart account. Instructions are located on the last page of this paperwork. If you have not heard from [place] regarding the results in 2 weeks, please contact this office.

## 2019-07-13 NOTE — Progress Notes (Signed)
Subjective:  Patient ID: Nicholas Potts, male    DOB: 1960/09/13  Age: 59 y.o. MRN: [id number]  CC:  Chief Complaint  Patient presents with  . Follow-up    on yesterdays tele-med appt. pt is still feeling a little fatigue and having a little bit of the palpatations. pt states back aches, and all this started about 1 month ago.   Here with son - translating - offered video interpreter - declined.  HPI Nicholas Potts presents for     Fatigue, heart palpitations Discussed with telemedicine visit yesterday. Fatigue has been noted over the past month, waking up with headaches in the morning but improves during the day,, feeling more fatigued and tired in the day, similar to symptoms noted prior to start of CPAP in 2014.  Notes reviewed where he had a significant AHI of 79/h on his sleep study in September 2014.  Last seen by pulmonary in June 2019, AutoPap at 5-20 at that time with an AHI of 0.8/h, 100% compliance on download.  Adderall has been discussed by pulmonary medicine previously. Reported palpitations intermittently recently, where change in breathing sensation when these occur.  Feels like her heart goes slower. He does have a history of CVA, nonischemic cardiomyopathy - cardiac and respiratory arrest 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 narcotic overdose, impaired fasting glucose, hyperlipidemia, hypertension.  Seen most recently by Dr. Ellyn Hack in October 2019 with chronic fatigue.  Most recent echocardiogram 03/31/2018, EF 55 to 66%, normal systolic function, no regional wall motion abnormalities.  Feels like fatigue is different than with cardiology.   Minimal palpitations this morning. 10 seconds. None at present.  No fever, no chest pains.  Using cpap, sleeps through the night.  Referral was placed.  Specialist yesterday.Compliance report faxed here today.   Compliance report noted from January 5 through February 3.  Usage of 30 out of 30 days, 100%.  100% greater than 4 hours  use.  Average usage is 6 hours and 5 minutes.  AHI 1.1,  median pressure 12.3.  AutoPap.  No Rx meds currently. Prior HLD.  The 10-year ASCVD risk score Mikey Bussing DC Brooke Bonito., et al., 2013) is: 9.9%   Values used to calculate the score:     Age: 1 years     Sex: Male     Is Non-Hispanic African American: No     Diabetic: No     Tobacco smoker: No     Systolic Blood Pressure: 440 mmHg     Is BP treated: No     HDL Cholesterol: 34 mg/dL     Total Cholesterol: 209 mg/dL   Lab Results  Component Value Date   CHOL 209 (H) 01/01/2018   HDL 34 (L) 01/01/2018   LDLCALC 133 (H) 01/01/2018   TRIG 208 (H) 01/01/2018   CHOLHDL 6.1 (H) 01/01/2018    History Patient Active Problem List   Diagnosis Date Noted  . Hypersomnia 11/29/2017  . Other headache syndrome 05/05/2017  . Vertigo 05/05/2017  . IFG (impaired fasting glucose) 08/21/2016  . Obesity (BMI 30-39.9) 06/23/2013  . Stress-induced cardiomyopathy -- essentially resolved   . CVA (cerebral infarction)   . OSA (obstructive sleep apnea)   . Hyperlipidemia   . Physical deconditioning 01/24/2013  . Tinea pedis 01/18/2013  . Essential hypertension 01/13/2013  . 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[place]) 01/11/2013  . ARF (acute renal failure) with tubular necrosis requiring HD 01/11/2013  . Anemia 01/11/2013  . Thrombocytopenia, unspecified ([place]) 01/11/2013  . 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[place]) 2014   post op   . ARF (acute respiratory failure) ([place]) 2014  . Blood dyscrasia   . Chronic kidney disease 2014   ARF   . Complication of anesthesia 2014   - post op - in combination of pain pills-  Cardiac and Respiratory Arrest- prior to sleep apnea diagonosis  . Constipation   . CVA (cerebral infarction)   . Hyperlipidemia   . Hypertension   . OSA (obstructive sleep apnea)   . Sleep apnea   .  Stress-induced cardiomyopathy -- essentially resolved    Post-op Narcotic induced Acute Hypoxic respiratory failure with respiratory and PEA cardiac arrest following accidental narcotic overdose; initial [place] 20-20%, now improved to 55%   Was supported with Impella Cardiac Cath - non-obstructive CAD   . Thrombocytopenia ([place]) 2014   Past Surgical History:  Procedure Laterality Date  . ACHILLES TENDON REPAIR    . CARDIAC CATHETERIZATION  12/29/2012   RN LHC --> Impella. for severe shock with low EF. Mild to moderate RV pressure elevation. Normal coronary arteries. Cardiac Output 2.5, Index 1.46.  Marland Kitchen COLONOSCOPY WITH PROPOFOL N/A 10/30/2015   Procedure: COLONOSCOPY WITH PROPOFOL;  Surgeon: Mauri Pole, MD;  Location: [facility];  Service: Endoscopy;  Laterality: N/A;  . INSERTION OF DIALYSIS CATHETER Right 01/09/2013   Procedure: INSERTION OF DIALYSIS CATHETER;  Surgeon: Angelia Mould, MD;  Location: [facility];  Service: Vascular;  Laterality: Right;  . LEFT AND RIGHT HEART CATHETERIZATION WITH CORONARY ANGIOGRAM  12/29/2012   Procedure: LEFT AND RIGHT HEART CATHETERIZATION WITH CORONARY ANGIOGRAM;  Surgeon: Sherren Mocha, MD;  Location: [facility] CATH LAB;  Service: Cardiovascular;;  . TRANSTHORACIC ECHOCARDIOGRAM  12/29/2012   EF 25% with periapical HK/AKA.  --> Followup echo 7/25 -- EF of 35%  . TRANSTHORACIC ECHOCARDIOGRAM  01/2013 ; 07/2015   a) EF 55-60%. No regional WMA. Grade 1 diastolic dysfunction. Mild LA dilatation.; b) Normal LV size with mild LV hypertrophy. EF 55-60%. Normal RV   Allergies  Allergen Reactions  . Oxycodone     Oxygen levels drop   Prior to Admission medications   Medication Sig Start Date End Date Taking? Authorizing Provider  aspirin EC 81 MG tablet Take 1 tablet (81 mg total) by mouth daily. 05/13/17  Yes Meng, Isaac Laud, PA  Na Sulfate-K Sulfate-Mg Sulf 17.5-3.13-1.6 GM/177ML SOLN Follow instructions from the doctor 05/31/19  Yes Mauri Pole, MD   Social  History   Socioeconomic History  . Marital status: Married    Spouse name: Not on file  . Number of children: Not on file  . Years of education: Not on file  . Highest education level: Not on file  Occupational History  . Not on file  Tobacco Use  . Smoking status: Former Smoker    Quit date: 06/20/1986    Years since quitting: 33.0  . Smokeless tobacco: Never Used  Substance and Sexual Activity  . Alcohol use: Yes    Comment: History of heavy use but only social at present.  10/29/15 - "a beer every week or so"  . Drug use: No    Comment: Prior cocaine use but none since 2000  . Sexual activity: Never  Other Topics Concern  . Not on file  Social History Narrative   He is widowed for 13 years, and has 2 daughters: Verdis Frederickson  [place], and one son Rutger Salton. the other history is daughter who is here with him today.    He works for [employer] as a [employer].   He quit smoking in 1988. He does drink alcohol socially, but he does have a history of heavy use in the past. He also has a prior history of cocaine use but none since 2000.   Social Determinants of Health   Financial Resource Strain:   . Difficulty of Paying Living Expenses: Not on file  Food Insecurity:   . Worried About Charity fundraiser in the Last Year: Not on file  . Ran Out of Food in the Last Year: Not on file  Transportation Needs:   . Lack of Transportation (Medical): Not on file  . Lack of Transportation (Non-Medical): Not on file  Physical Activity:   . Days of Exercise per Week: Not on file  . Minutes of Exercise per Session: Not on file  Stress:   . Feeling of Stress : Not on file  Social Connections:   . Frequency of Communication with Friends and Family: Not on file  . Frequency of Social Gatherings with Friends and Family: Not on file  . Attends Religious Services: Not on file  . Active Member of Clubs or Organizations: Not on file  .  Attends Archivist Meetings: Not on file  . Marital Status: Not on file  Intimate Partner Violence:   . Fear of Current or Ex-Partner: Not on file  . Emotionally Abused: Not on file  . Physically Abused: Not on file  . Sexually Abused: Not on file    Review of Systems  Constitutional: Positive for fatigue. Negative for chills and fever.  Respiratory: Positive for shortness of breath (with heart palpitaitons only at times. ). Negative for cough and chest tightness.   Cardiovascular: Positive for palpitations. Negative for chest pain and leg swelling.  Gastrointestinal: Negative for abdominal distention, anal bleeding and blood in stool.    Per HPI.  Objective:   Vitals:   07/13/19 1447  BP: 115/76  Pulse: 87  Temp: 98.6 F (37 C)  TempSrc: Temporal  SpO2: 95%  Weight: 265 lb (120.2 kg)  Height: '5\' 7"'$  (1.702 m)     Physical Exam Vitals reviewed.  Constitutional:      Appearance: He is well-developed.  HENT:     Head: Normocephalic and atraumatic.  Eyes:     Pupils: Pupils are equal, round, and reactive to light.  Neck:     Vascular: No carotid bruit or JVD.     Comments: No jvd  Cardiovascular:     Rate and Rhythm: Normal rate and regular rhythm.     Heart sounds: Normal heart sounds. No murmur.  Pulmonary:     Effort: Pulmonary effort is normal. No respiratory distress.     Breath sounds: Normal breath sounds. No stridor. No wheezing or rales.  Abdominal:     General: Bowel sounds are normal.     Tenderness: There is no abdominal tenderness.  Skin:    General: Skin is warm and dry.  Neurological:     Mental Status: He is alert and oriented to person, place, and time.    EKG: Sinus rhythm, rate 85, no acute findings.   Assessment & Plan:  PREM COYKENDALL is a 59 y.o. male . Fatigue, unspecified type - Plan: TSH, POCT CBC, POCT glucose (manual entry), CMP14+EGFR,  Lipid panel, CANCELED: BASIC METABOLIC PANEL WITH GFR  - prior fatigue, with  recurrence. Most recent echo reassuring. No apparent sign of fluid overload.  CPAP download with stable control.   - referral placed prior to sleep medicine  - labs above  - refer to cardiology  Palpitations - Plan: EKG 12-Lead, TSH, POCT CBC, POCT glucose (manual entry), CANCELED: BASIC METABOLIC PANEL WITH GFR  - in office EKG without acute findings.   - check tsh, refer back to cardiology, with ER precautions given.   Hyperlipidemia, unspecified hyperlipidemia type - Plan: CMP14+EGFR, Lipid panel  - updated labs ordered.   OSA on CPAP  -controlled by CPAP download, referred to sleep specialist to determine if updated testing needed.   No orders of the defined types were placed in this encounter.  Patient Instructions   I will refer you back to cardiology for fatigue and new heart palpitations.  Return to the clinic or go to the nearest emergency room if any of your symptoms worsen or new symptoms occur.  Here is a link to information about the COVID-19 vaccine: [employer] COVID-19 Vaccine Information can be found at: ShippingScam.co.uk For questions related to vaccine distribution or appointments, please email [email or web address] or call [phone].    If you have lab work done today you will be contacted with your lab results within the next 2 weeks.  If you have not heard from [place] then please contact us. The fastest way to get your results is to register for My Chart.   IF you received an x-ray today, you will receive an invoice from [facility]. Please contact [facility] at [phone] with questions or concerns regarding your invoice.   IF you received labwork today, you will receive an invoice from [facility]. Please contact [facility] at [phone] with questions or concerns regarding your invoice.   Our billing staff will not be able to assist you with questions  regarding bills from these companies.  You will be contacted with the lab results as soon as they are available. The fastest way to get your results is to activate your My Chart account. Instructions are located on the last page of this paperwork. If you have not heard from [place] regarding the results in 2 weeks, please contact this office.         Signed, Merri Ray, MD [facility]

## 2019-07-14 LAB — TSH: TSH: 1.92 u[IU]/mL (ref 0.450–4.500)

## 2019-07-14 LAB — CMP14+EGFR
ALT: 22 IU/L (ref 0–44)
AST: 22 IU/L (ref 0–40)
Albumin/Globulin Ratio: 1.6 (ref 1.2–2.2)
Albumin: 4.2 g/dL (ref 3.8–4.9)
Alkaline Phosphatase: 91 IU/L (ref 39–117)
BUN/Creatinine Ratio: 13 (ref 10–24)
BUN: 13 mg/dL (ref 8–27)
Bilirubin Total: 0.3 mg/dL (ref 0.0–1.2)
CO2: 21 mmol/L (ref 20–29)
Calcium: 9.3 mg/dL (ref 8.6–10.2)
Chloride: 105 mmol/L (ref 96–106)
Creatinine, Ser: 1.03 mg/dL (ref 0.76–1.27)
GFR calc Af Amer: 91 mL/min/{1.73_m2} (ref >59)
GFR calc non Af Amer: 79 mL/min/{1.73_m2} (ref >59)
Globulin, Total: 2.6 g/dL (ref 1.5–4.5)
Glucose: 84 mg/dL (ref 65–99)
Potassium: 4.4 mmol/L (ref 3.5–5.2)
Sodium: 138 mmol/L (ref 134–144)
Total Protein: 6.8 g/dL (ref 6.0–8.5)

## 2019-07-14 LAB — LIPID PANEL
Chol/HDL Ratio: 6.8 ratio — ABNORMAL HIGH (ref 0.0–5.0)
Cholesterol, Total: 217 mg/dL — ABNORMAL HIGH (ref 100–199)
HDL: 32 mg/dL — ABNORMAL LOW (ref >39)
LDL Chol Calc (NIH): 140 mg/dL — ABNORMAL HIGH (ref 0–99)
Triglycerides: 249 mg/dL — ABNORMAL HIGH (ref 0–149)
VLDL Cholesterol Cal: 45 mg/dL — ABNORMAL HIGH (ref 5–40)

## 2019-07-28 ENCOUNTER — Telehealth: Payer: Self-pay | Admitting: Gastroenterology

## 2019-07-28 ENCOUNTER — Other Ambulatory Visit (HOSPITAL_COMMUNITY)
Admission: RE | Admit: 2019-07-28 | Discharge: 2019-07-28 | Disposition: A | Discharge status: Home / Self Care | Payer: BC Managed Care – PPO | Source: Ambulatory Visit | Attending: Gastroenterology | Admitting: Gastroenterology

## 2019-07-28 DIAGNOSIS — Z01812 Encounter for preprocedural laboratory examination: Secondary | ICD-10-CM | POA: Diagnosis not present

## 2019-07-28 DIAGNOSIS — Z20822 Contact with and (suspected) exposure to covid-19: Secondary | ICD-10-CM | POA: Diagnosis not present

## 2019-07-28 LAB — SARS CORONAVIRUS 2 (TAT 6-24 HRS): SARS Coronavirus 2: NEGATIVE

## 2019-07-28 MED ORDER — NA SULFATE-K SULFATE-MG SULF 17.5-3.13-1.6 GM/177ML PO SOLN: ORAL | 0 refills | Status: DC

## 2019-07-28 NOTE — Telephone Encounter (Signed)
Prescription for Suprep sent to patient's pharmacy.

## 2019-07-28 NOTE — Telephone Encounter (Signed)
Patient called requesting his prep for his procedure is for Tuesday 08/01/19.

## 2019-07-31 ENCOUNTER — Telehealth: Payer: Self-pay

## 2019-07-31 ENCOUNTER — Institutional Professional Consult (permissible substitution): Payer: BLUE CROSS/BLUE SHIELD | Admitting: Neurology

## 2019-07-31 ENCOUNTER — Other Ambulatory Visit: Payer: Self-pay

## 2019-07-31 DIAGNOSIS — Z86010 Personal history of colonic polyps: Secondary | ICD-10-CM

## 2019-07-31 NOTE — Telephone Encounter (Signed)
Patient no showed 07/31/2019 appointment with Dr. Rexene Alberts.

## 2019-08-01 ENCOUNTER — Ambulatory Visit (HOSPITAL_COMMUNITY)
Admission: RE | Admit: 2019-08-01 | Discharge: 2019-08-01 | Disposition: A | Discharge status: Home / Self Care | Payer: BC Managed Care – PPO | Attending: Gastroenterology | Admitting: Gastroenterology

## 2019-08-01 ENCOUNTER — Ambulatory Visit (HOSPITAL_COMMUNITY): Payer: BC Managed Care – PPO | Admitting: Anesthesiology

## 2019-08-01 ENCOUNTER — Other Ambulatory Visit: Payer: Self-pay

## 2019-08-01 ENCOUNTER — Encounter (HOSPITAL_COMMUNITY): Payer: Self-pay | Admitting: Gastroenterology

## 2019-08-01 ENCOUNTER — Encounter (HOSPITAL_COMMUNITY)
Admission: RE | Disposition: A | Discharge status: Home / Self Care | Payer: Self-pay | Source: Home / Self Care | Attending: Gastroenterology

## 2019-08-01 DIAGNOSIS — Z87891 Personal history of nicotine dependence: Secondary | ICD-10-CM | POA: Insufficient documentation

## 2019-08-01 DIAGNOSIS — D122 Benign neoplasm of ascending colon: Secondary | ICD-10-CM | POA: Diagnosis not present

## 2019-08-01 DIAGNOSIS — I129 Hypertensive chronic kidney disease with stage 1 through stage 4 chronic kidney disease, or unspecified chronic kidney disease: Secondary | ICD-10-CM | POA: Insufficient documentation

## 2019-08-01 DIAGNOSIS — D124 Benign neoplasm of descending colon: Secondary | ICD-10-CM | POA: Insufficient documentation

## 2019-08-01 DIAGNOSIS — E785 Hyperlipidemia, unspecified: Secondary | ICD-10-CM | POA: Diagnosis not present

## 2019-08-01 DIAGNOSIS — K635 Polyp of colon: Secondary | ICD-10-CM | POA: Diagnosis not present

## 2019-08-01 DIAGNOSIS — Z79899 Other long term (current) drug therapy: Secondary | ICD-10-CM | POA: Insufficient documentation

## 2019-08-01 DIAGNOSIS — Z7982 Long term (current) use of aspirin: Secondary | ICD-10-CM | POA: Insufficient documentation

## 2019-08-01 DIAGNOSIS — D175 Benign lipomatous neoplasm of intra-abdominal organs: Secondary | ICD-10-CM | POA: Diagnosis not present

## 2019-08-01 DIAGNOSIS — Z8673 Personal history of transient ischemic attack (TIA), and cerebral infarction without residual deficits: Secondary | ICD-10-CM | POA: Diagnosis not present

## 2019-08-01 DIAGNOSIS — Z1211 Encounter for screening for malignant neoplasm of colon: Secondary | ICD-10-CM | POA: Diagnosis not present

## 2019-08-01 DIAGNOSIS — G4733 Obstructive sleep apnea (adult) (pediatric): Secondary | ICD-10-CM | POA: Insufficient documentation

## 2019-08-01 DIAGNOSIS — D127 Benign neoplasm of rectosigmoid junction: Secondary | ICD-10-CM | POA: Diagnosis not present

## 2019-08-01 DIAGNOSIS — N189 Chronic kidney disease, unspecified: Secondary | ICD-10-CM | POA: Diagnosis not present

## 2019-08-01 DIAGNOSIS — Z860100 Personal history of colon polyps, unspecified: Secondary | ICD-10-CM

## 2019-08-01 DIAGNOSIS — D123 Benign neoplasm of transverse colon: Secondary | ICD-10-CM | POA: Insufficient documentation

## 2019-08-01 DIAGNOSIS — K6389 Other specified diseases of intestine: Secondary | ICD-10-CM | POA: Insufficient documentation

## 2019-08-01 DIAGNOSIS — K5730 Diverticulosis of large intestine without perforation or abscess without bleeding: Secondary | ICD-10-CM | POA: Insufficient documentation

## 2019-08-01 DIAGNOSIS — Z8674 Personal history of sudden cardiac arrest: Secondary | ICD-10-CM | POA: Diagnosis not present

## 2019-08-01 DIAGNOSIS — K648 Other hemorrhoids: Secondary | ICD-10-CM | POA: Diagnosis not present

## 2019-08-01 DIAGNOSIS — I2510 Atherosclerotic heart disease of native coronary artery without angina pectoris: Secondary | ICD-10-CM | POA: Insufficient documentation

## 2019-08-01 DIAGNOSIS — Z885 Allergy status to narcotic agent status: Secondary | ICD-10-CM | POA: Diagnosis not present

## 2019-08-01 DIAGNOSIS — I10 Essential (primary) hypertension: Secondary | ICD-10-CM | POA: Diagnosis not present

## 2019-08-01 DIAGNOSIS — D120 Benign neoplasm of cecum: Secondary | ICD-10-CM | POA: Diagnosis not present

## 2019-08-01 HISTORY — PX: COLONOSCOPY WITH PROPOFOL: SHX5780

## 2019-08-01 HISTORY — PX: POLYPECTOMY: SHX5525

## 2019-08-01 HISTORY — PX: BIOPSY: SHX5522

## 2019-08-01 SURGERY — COLONOSCOPY WITH PROPOFOL
Anesthesia: Monitor Anesthesia Care

## 2019-08-01 MED ORDER — PROPOFOL 500 MG/50ML IV EMUL
INTRAVENOUS | Status: DC | PRN
  Administered 2019-08-01: 150 ug/kg/min via INTRAVENOUS

## 2019-08-01 MED ORDER — PHENYLEPHRINE HCL (PRESSORS) 10 MG/ML IV SOLN
INTRAVENOUS | Status: DC | PRN
  Administered 2019-08-01: 40 ug via INTRAVENOUS
  Administered 2019-08-01: 60 ug via INTRAVENOUS
  Administered 2019-08-01: 40 ug via INTRAVENOUS
  Administered 2019-08-01: 80 ug via INTRAVENOUS

## 2019-08-01 MED ORDER — EPHEDRINE SULFATE 50 MG/ML IJ SOLN
INTRAMUSCULAR | Status: DC | PRN
  Administered 2019-08-01: 10 mg via INTRAVENOUS
  Administered 2019-08-01: 5 mg via INTRAVENOUS

## 2019-08-01 MED ORDER — LIDOCAINE HCL 1 % IJ SOLN
INTRAMUSCULAR | Status: DC | PRN
  Administered 2019-08-01: 50 mg via INTRADERMAL

## 2019-08-01 MED ORDER — LACTATED RINGERS IV SOLN
INTRAVENOUS | Status: DC
  Administered 2019-08-01: 1000 mL via INTRAVENOUS

## 2019-08-01 MED ORDER — SODIUM CHLORIDE 0.9 % IV SOLN: INTRAVENOUS | Status: DC

## 2019-08-01 SURGICAL SUPPLY — 22 items

## 2019-08-01 NOTE — Anesthesia Postprocedure Evaluation (Signed)
Anesthesia Post Note  Patient: Nicholas Potts  Procedure(s) Performed: COLONOSCOPY WITH PROPOFOL (N/A ) BIOPSY POLYPECTOMY     Patient location during evaluation: PACU Anesthesia Type: MAC Level of consciousness: awake and alert Pain management: pain level controlled Vital Signs Assessment: post-procedure vital signs reviewed and stable Respiratory status: spontaneous breathing, nonlabored ventilation and respiratory function stable Cardiovascular status: blood pressure returned to baseline and stable Postop Assessment: no apparent nausea or vomiting Anesthetic complications: no    Last Vitals:  Vitals:   08/01/19 1057 08/01/19 1103  BP: (!) 103/59 (!) 108/47  Pulse: 72 80  Resp: 14 15  Temp:    SpO2: 100% 98%    Last Pain:  Vitals:   08/01/19 1103  TempSrc:   PainSc: [place]

## 2019-08-01 NOTE — Op Note (Signed)
[facility] Patient Name: Nicholas Potts Procedure Date: 08/01/2019 MRN: [id number] Attending MD: Mauri Pole , MD Date of Birth: 12-22-1960 CSN: [id number] Age: 59 Admit Type: Outpatient Procedure:                Colonoscopy Indications:              High risk colon cancer surveillance: Personal                            history of colonic polyps, High risk colon cancer                            surveillance: Personal history of adenoma (10 mm or                            greater in size), High risk colon cancer                            surveillance: Personal history of multiple (3 or                            more) adenomas Providers:                Mauri Pole, MD, Cleda Daub, RN, Cletis Athens, Technician, Karis Juba, CRNA Referring MD:              Medicines:                Monitored Anesthesia Care Complications:            No immediate complications. Estimated Blood Loss:     Estimated blood loss was minimal. Procedure:                Pre-Anesthesia Assessment:                           - Prior to the procedure, a History and Physical                            was performed, and patient medications and                            allergies were reviewed. The patient's tolerance of                            previous anesthesia was also reviewed. The risks                            and benefits of the procedure and the sedation                            options and risks were discussed with the patient.  All questions were answered, and informed consent                            was obtained. Prior Anticoagulants: The patient has                            taken no previous anticoagulant or antiplatelet                            agents. ASA Grade Assessment: III - A patient with                            severe systemic disease. After reviewing the risks           and benefits, the patient was deemed in                            satisfactory condition to undergo the procedure.                           After obtaining informed consent, the colonoscope                            was passed under direct vision. Throughout the                            procedure, the patient's blood pressure, pulse, and                            oxygen saturations were monitored continuously. The                            [id number] Olympus colonoscope was                            introduced through the anus and advanced to the the                            cecum, identified by appendiceal orifice and                            ileocecal valve. The colonoscopy was performed                            without difficulty. The patient tolerated the                            procedure well. The quality of the bowel                            preparation was excellent. The ileocecal valve,                            appendiceal orifice, and rectum were photographed. Scope In: 10:08:11 AM Scope Out:  10:37:42 AM Scope Withdrawal Time: 0 hours 24 minutes 24 seconds  Total Procedure Duration: 0 hours 29 minutes 31 seconds  Findings:      The perianal and digital rectal examinations were normal.      A diffuse area of severe melanosis was found in the entire colon.      Five sessile polyps were found in the transverse colon X3 and ascending       colon X2 . The polyps were 4 to 6 mm in size. These polyps were removed       with a cold snare. Resection and retrieval were complete.      Eight sessile polyps were found in the recto-sigmoid colon X1,       descending colon X1, ascending colon X 5 and cecum X1. The polyps were 1       to 2 mm in size. These polyps were removed with a cold biopsy forceps.       Resection and retrieval were complete.      There was a small lipoma, in the ascending colon.      A few small-mouthed diverticula were found in the  sigmoid colon and       descending colon.      Non-bleeding internal hemorrhoids were found during retroflexion. The       hemorrhoids were small. Impression:               - Melanosis in the colon.                           - Five 4 to 6 mm polyps in the transverse colon and                            in the ascending colon, removed with a cold snare.                            Resected and retrieved.                           - Eight 1 to 2 mm polyps at the recto-sigmoid                            colon, in the descending colon, in the ascending                            colon and in the cecum, removed with a cold biopsy                            forceps. Resected and retrieved.                           - Small lipoma in the ascending colon.                           - Diverticulosis in the sigmoid colon and in the                            descending colon.                           -  Non-bleeding internal hemorrhoids. Moderate Sedation:      Not Applicable - Patient had care per Anesthesia. Recommendation:           - Patient has a contact number available for                            emergencies. The signs and symptoms of potential                            delayed complications were discussed with the                            patient. Return to normal activities tomorrow.                            Written discharge instructions were provided to the                            patient.                           - Resume previous diet.                           - Continue present medications.                           - Await pathology results.                           - Repeat colonoscopy in 1 year for surveillance                            based on pathology results. Procedure Code(s):        --- Professional ---                           [phone], Colonoscopy, flexible; with removal of                            tumor(s), polyp(s), or other lesion(s) by snare                             technique                           45380, 65, Colonoscopy, flexible; with biopsy,                            single or multiple Diagnosis Code(s):        --- Professional ---                           K63.89, Other specified diseases of intestine                           K63.5, Polyp of colon  D17.5, Benign lipomatous neoplasm of                            intra-abdominal organs                           K64.8, Other hemorrhoids                           Z86.010, Personal history of colonic polyps                           K57.30, Diverticulosis of large intestine without                            perforation or abscess without bleeding CPT copyright 2019 American Medical Association. All rights reserved. The codes documented in this report are preliminary and upon coder review may  be revised to meet current compliance requirements. Mauri Pole, MD 08/01/2019 10:45:53 AM This report has been signed electronically. Number of Addenda: 0

## 2019-08-01 NOTE — Transfer of Care (Signed)
Immediate Anesthesia Transfer of Care Note  Patient: Nicholas Potts  Procedure(s) Performed: COLONOSCOPY WITH PROPOFOL (N/A ) BIOPSY POLYPECTOMY  Patient Location: PACU and Endoscopy Unit  Anesthesia Type:MAC  Level of Consciousness: awake, oriented, drowsy and patient cooperative  Airway & Oxygen Therapy: Patient Spontanous Breathing and Patient connected to face mask oxygen  Post-op Assessment: Report given to RN and Post -op Vital signs reviewed and stable  Post vital signs: Reviewed and stable  Last Vitals:  Vitals Value Taken Time  BP 94/45 08/01/19 1050  Temp 36.4 C 08/01/19 1049  Pulse 86 08/01/19 1051  Resp 17 08/01/19 1051  SpO2 100 % 08/01/19 1051  Vitals shown include unvalidated device data.  Last Pain:  Vitals:   08/01/19 1049  TempSrc: Axillary  PainSc:          Complications: No apparent anesthesia complications

## 2019-08-01 NOTE — Discharge Instructions (Signed)
YOU HAD AN ENDOSCOPIC PROCEDURE TODAY: Refer to the procedure report and other information in the discharge instructions given to you for any specific questions about what was found during the examination. If this information does not answer your questions, please call [facility] office at [phone] to clarify.   SYMPTOMS TO REPORT IMMEDIATELY: A gastroenterologist can be reached at any hour. Please call [phone]  for any of the following symptoms:  Following lower endoscopy (colonoscopy, flexible sigmoidoscopy) Excessive amounts of blood in the stool  Significant tenderness, worsening of abdominal pains  Swelling of the abdomen that is new, acute  Fever of 100 or higher

## 2019-08-01 NOTE — Anesthesia Preprocedure Evaluation (Addendum)
Anesthesia Evaluation  Patient identified by MRN, date of birth, ID band Patient awake  General Assessment Comment:History of post-op narcotic induced AHRF with PEA cardiac arrest following accidental narcotic overdose, prior to diagnosis of OSA   Reviewed: Allergy & Precautions, NPO status , Patient's Chart, lab work & pertinent test results  History of Anesthesia Complications (+) history of anesthetic complications  Airway Mallampati: III  TM Distance: >3 FB Neck ROM: Full    Dental  (+) Teeth Intact, Dental Advisory Given   Pulmonary sleep apnea , former smoker,    breath sounds clear to auscultation       Cardiovascular hypertension, Pt. on medications  Rhythm:Regular Rate:Normal  Stress induced CM after resp arrest- resolved   Neuro/Psych  Headaches, negative psych ROS   GI/Hepatic Neg liver ROS, Screening colonoscopy, history of polyps   Endo/Other    Renal/GU Renal disease  negative genitourinary   Musculoskeletal negative musculoskeletal ROS (+)   Abdominal (+) + obese,   Peds  Hematology  (+) Blood dyscrasia, anemia ,   Anesthesia Other Findings   Reproductive/Obstetrics negative OB ROS                          Anesthesia Physical  Anesthesia Plan  ASA: III  Anesthesia Plan: MAC   Post-op Pain Management:    Induction: Intravenous  PONV Risk Score and Plan: Propofol infusion and TIVA  Airway Management Planned: Simple Face Mask  Additional Equipment:   Intra-op Plan:   Post-operative Plan:   Informed Consent: I have reviewed the patients History and Physical, chart, labs and discussed the procedure including the risks, benefits and alternatives for the proposed anesthesia with the patient or authorized representative who has indicated his/her understanding and acceptance.     Dental advisory given  Plan Discussed with: CRNA  Anesthesia Plan Comments:         Anesthesia Quick Evaluation

## 2019-08-01 NOTE — H&P (Signed)
[facility] History and Physical   Primary Care Physician:  [name], MD   Reason for Procedure:   history of colon polyps  Plan:    Surveillance colonoscopy     HPI: Nicholas Potts is a 59 y.o. male here for surveillance colonoscopy. Denies any nausea, vomiting, abdominal pain, melena or bright red blood per rectum    Past Medical History:  Diagnosis Date  . 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[place]) 2014   post op   . ARF (acute respiratory failure) ([place]) 2014  . Blood dyscrasia   . Chronic kidney disease 2014   ARF   . Complication of anesthesia 2014   - post op - in combination of pain pills-  Cardiac and Respiratory Arrest- prior to sleep apnea diagonosis  . Constipation   . CVA (cerebral infarction)   . Hyperlipidemia   . Hypertension   . OSA (obstructive sleep apnea)   . Sleep apnea   . Stress-induced cardiomyopathy -- essentially resolved    Post-op Narcotic induced Acute Hypoxic respiratory failure with respiratory and PEA cardiac arrest following accidental narcotic overdose; initial [place] 20-20%, now improved to 55%   Was supported with Impella Cardiac Cath - non-obstructive CAD   . Thrombocytopenia ([place]) 2014    Past Surgical History:  Procedure Laterality Date  . ACHILLES TENDON REPAIR    . CARDIAC CATHETERIZATION  12/29/2012   RN LHC --> Impella. for severe shock with low EF. Mild to moderate RV pressure elevation. Normal coronary arteries. Cardiac Output 2.5, Index 1.46.  Marland Kitchen COLONOSCOPY WITH PROPOFOL N/A 10/30/2015   Procedure: COLONOSCOPY WITH PROPOFOL;  Surgeon: Mauri Pole, MD;  Location: [facility];  Service: Endoscopy;  Laterality: N/A;  . INSERTION OF DIALYSIS CATHETER Right 01/09/2013   Procedure: INSERTION OF DIALYSIS CATHETER;  Surgeon: Angelia Mould, MD;  Location: [facility];  Service: Vascular;  Laterality: Right;  . LEFT AND RIGHT HEART CATHETERIZATION WITH CORONARY ANGIOGRAM  12/29/2012   Procedure: LEFT AND RIGHT  HEART CATHETERIZATION WITH CORONARY ANGIOGRAM;  Surgeon: Sherren Mocha, MD;  Location: [facility];  Service: Cardiovascular;;  . TRANSTHORACIC ECHOCARDIOGRAM  12/29/2012   EF 25% with periapical HK/AKA.  --> Followup echo 7/25 -- EF of 35%  . TRANSTHORACIC ECHOCARDIOGRAM  01/2013 ; 07/2015   a) EF 55-60%. No regional WMA. Grade 1 diastolic dysfunction. Mild LA dilatation.; b) Normal LV size with mild LV hypertrophy. EF 55-60%. Normal RV    Prior to Admission medications   Medication Sig Start Date End Date Taking? Authorizing Provider  aspirin EC 81 MG tablet Take 1 tablet (81 mg total) by mouth daily. 05/13/17  Yes Almyra Deforest, PA  ibuprofen (ADVIL) 200 MG tablet Take 600 mg by mouth every 6 (six) hours as needed for headache or moderate pain.   Yes [provider]  Multiple Vitamin (MULTIVITAMIN WITH MINERALS) TABS tablet Take 1 tablet by mouth daily.   Yes [provider]  Na Sulfate-K Sulfate-Mg Sulf 17.5-3.13-1.6 GM/177ML SOLN Follow instructions from the doctor 07/28/19  Yes Ladene Artist, MD    Current Facility-Administered Medications  Medication Dose Route Frequency Provider Last Rate Last Admin  . 0.9 %  sodium chloride infusion   Intravenous Continuous Tyde Lamison V, MD      . lactated ringers infusion   Intravenous Continuous Mauri Pole, MD        Allergies as of 05/30/2019 - Review Complete 03/15/2018  Allergen Reaction Noted  . Oxycodone  10/02/2015  Family History  Problem Relation Age of Onset  . Cancer Mother        Liver cancer  . Cancer Father        Leukemia  . Cancer Maternal Grandmother        Liver cancer  . Cancer Paternal Grandmother        Colon cancer  . Colon cancer Neg Hx     Social History   Socioeconomic History  . Marital status: Married    Spouse name: Not on file  . Number of children: Not on file  . Years of education: Not on file  . Highest education level: Not on file  Occupational History  . Not on  file  Tobacco Use  . Smoking status: Former Smoker    Quit date: 06/20/1986    Years since quitting: 33.1  . Smokeless tobacco: Never Used  Substance and Sexual Activity  . Alcohol use: Yes    Comment: History of heavy use but only social at present.  10/29/15 - "a beer every week or so"  . Drug use: No    Comment: Prior cocaine use but none since 2000  . Sexual activity: Never  Other Topics Concern  . Not on file  Social History Narrative   He is widowed for 13 years, and has 2 daughters: [name] & [address] [name], and one son Lummie Morataya. the other history is daughter who is here with him today.    He works for [employer] as a [employer].   He quit smoking in 1988. He does drink alcohol socially, but he does have a history of heavy use in the past. He also has a prior history of cocaine use but none since 2000.   Social Determinants of Health   Financial Resource Strain:   . Difficulty of Paying Living Expenses: Not on file  Food Insecurity:   . Worried About Charity fundraiser in the Last Year: Not on file  . Ran Out of Food in the Last Year: Not on file  Transportation Needs:   . Lack of Transportation (Medical): Not on file  . Lack of Transportation (Non-Medical): Not on file  Physical Activity:   . Days of Exercise per Week: Not on file  . Minutes of Exercise per Session: Not on file  Stress:   . Feeling of Stress : Not on file  Social Connections:   . Frequency of Communication with Friends and Family: Not on file  . Frequency of Social Gatherings with Friends and Family: Not on file  . Attends Religious Services: Not on file  . Active Member of Clubs or Organizations: Not on file  . Attends Archivist Meetings: Not on file  . Marital Status: Not on file  Intimate Partner Violence:   . Fear of Current or Ex-Partner: Not on file  . Emotionally Abused: Not on file  . Physically Abused: Not on file  .  Sexually Abused: Not on file    Review of Systems:  All other review of systems negative except as mentioned in the HPI.  Physical Exam: Vital signs in last 24 hours: Temp:  [98.6 F (37 C)] 98.6 F (37 C) (02/23 0939) Pulse Rate:  [71] 71 (02/23 0939) Resp:  [12] 12 (02/23 0939) BP: (127)/(71) 127/71 (02/23 0939) SpO2:  [98 %] 98 % (02/23 0939) Weight:  [117.9 kg] 117.9 kg (02/23 0939)   General:   Alert,  pleasant and cooperative in NAD Lungs:  Clear throughout to auscultation.   Heart:  Regular rate and rhythm; no murmurs, clicks, rubs,  or gallops. Abdomen:  Soft, nontender and nondistended. Normal bowel sounds.   Neuro/Psych:  Alert and cooperative. Normal mood and affect. A and O x 3   [name] , MD [phone]

## 2019-08-02 ENCOUNTER — Encounter: Payer: Self-pay | Admitting: Gastroenterology

## 2019-08-02 ENCOUNTER — Institutional Professional Consult (permissible substitution): Payer: BLUE CROSS/BLUE SHIELD | Admitting: Neurology

## 2019-08-02 LAB — SURGICAL PATHOLOGY

## 2019-08-03 ENCOUNTER — Encounter: Payer: Self-pay | Admitting: *Deleted

## 2019-08-08 ENCOUNTER — Encounter: Payer: Self-pay | Admitting: Neurology

## 2019-08-09 ENCOUNTER — Ambulatory Visit (INDEPENDENT_AMBULATORY_CARE_PROVIDER_SITE_OTHER): Payer: BC Managed Care – PPO | Admitting: Neurology

## 2019-08-09 ENCOUNTER — Other Ambulatory Visit: Payer: Self-pay

## 2019-08-09 ENCOUNTER — Encounter: Payer: Self-pay | Admitting: Neurology

## 2019-08-09 VITALS — BP 123/70 | HR 88 | Temp 97.8°F | Ht 67.0 in | Wt 263.0 lb

## 2019-08-09 DIAGNOSIS — G4733 Obstructive sleep apnea (adult) (pediatric): Secondary | ICD-10-CM | POA: Diagnosis not present

## 2019-08-09 DIAGNOSIS — R519 Headache, unspecified: Secondary | ICD-10-CM | POA: Diagnosis not present

## 2019-08-09 DIAGNOSIS — Z9989 Dependence on other enabling machines and devices: Secondary | ICD-10-CM

## 2019-08-09 DIAGNOSIS — R635 Abnormal weight gain: Secondary | ICD-10-CM

## 2019-08-09 DIAGNOSIS — Z6841 Body Mass Index (BMI) 40.0 and over, adult: Secondary | ICD-10-CM

## 2019-08-09 NOTE — Progress Notes (Signed)
Subjective:    Patient ID: Nicholas Potts is a 59 y.o. male.  HPI     Nicholas Age, MD, PhD [facility] [address] [place], [place] [address]  Dear Dr. Carlota Potts,   I saw your patient, and Nicholas Potts, upon your kind request, in my neurologic clinic today for initial consultation of his sleep disorder, in particular, evaluation of his diagnosis of sleep apnea.  The patient is accompanied by his son, Nicholas Potts, who assists with Spanish Interpretation and they declined the interpreter today.  As you know, Mr. Nicholas Potts is a 59 year old right-handed gentleman with an underlying medical history of cardiomyopathy, hypertension, hyperlipidemia, stroke, accidental opioid overdose in 2014 with acute respiratory failure, thrombocytopenia and morbid obesity with a BMI of over 40, who was diagnosed with severe obstructive sleep apnea in 2014.  He has been on AutoPap therapy.  I reviewed your office note from 07/13/2019.  His Epworth sleepiness score is 8/24. He has seen Dr. Jannifer Potts in our office for dizziness and memory loss in 2018.  He was diagnosed with severe obstructive sleep apnea in September 2014, AHI was 79.7 at the time, O2 nadir 53%, he had a subsequent CPAP titration study on 03/22/2013. He has been on AutoPap therapy, I was able to review a download from 07/10/2019 through the 08/08/2019, during which time he used his machine every night with percent use days greater than 4 hours at 97%, indicating excellent compliance with an average usage of 6 hours and 9 minutes, residual AHI 1.2/h, average pressure for the 95th percentile at 15.8 cm with a range of 5 cm to 20 cm, leak on the high side with a 95th percentile at 23 L/min. He reports using a full facemask. He reports full compliance with his machine, has had some morning headaches about a month ago but in the past 2 weeks he has not had any morning headaches. Bedtime is generally around 10 PM  and rise time around 5 AM. He works in a [employer] features editor. He quit smoking some 32 years ago, drinks alcohol infrequently, caffeine in the form of coffee, 1 cup/day and 1 soda per day on average. He is widowed, wife died about 3 years ago in a car accident and he lives with his son and younger daughter, he has an older daughter as well. They have 1 cat in the household, not in his bedroom at night. He does have a TV in the bedroom but turns it off before falling asleep. He has gained weight over time. He is currently working on weight loss.His son reports that patient had a severe reaction to oxycodone some years ago and had gained a lot of water weight at the time.   His Past Medical History Is Significant For: Past Medical History:  Diagnosis Date  . 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[place]) 2014   post op   . ARF (acute respiratory failure) ([place]) 2014  . Blood dyscrasia   . Chronic kidney disease 2014   ARF   . Complication of anesthesia 2014   - post op - in combination of pain pills-  Cardiac and Respiratory Arrest- prior to sleep apnea diagonosis  . Constipation   . CVA (cerebral infarction)   . Hyperlipidemia   . Hypertension   . OSA (obstructive sleep apnea)   . Sleep apnea   . Stress-induced cardiomyopathy -- essentially resolved    Post-op Narcotic induced Acute Hypoxic respiratory failure with respiratory and PEA cardiac  arrest following accidental narcotic overdose; initial [place] 20-20%, now improved to 55%   Was supported with Impella Cardiac Cath - non-obstructive CAD   . Thrombocytopenia ([place]) 2014    His Past Surgical History Is Significant For: Past Surgical History:  Procedure Laterality Date  . ACHILLES TENDON REPAIR    . BIOPSY  08/01/2019   Procedure: BIOPSY;  Surgeon: Mauri Pole, MD;  Location: [facility];  Service: Endoscopy;;  . CARDIAC CATHETERIZATION  12/29/2012   RN LHC --> Impella. for severe shock with low EF. Mild to moderate RV pressure  elevation. Normal coronary arteries. Cardiac Output 2.5, Index 1.46.  Marland Kitchen COLONOSCOPY WITH PROPOFOL N/A 10/30/2015   Procedure: COLONOSCOPY WITH PROPOFOL;  Surgeon: Mauri Pole, MD;  Location: [facility];  Service: Endoscopy;  Laterality: N/A;  . COLONOSCOPY WITH PROPOFOL N/A 08/01/2019   Procedure: COLONOSCOPY WITH PROPOFOL;  Surgeon: Mauri Pole, MD;  Location: [facility];  Service: Endoscopy;  Laterality: N/A;  . INSERTION OF DIALYSIS CATHETER Right 01/09/2013   Procedure: INSERTION OF DIALYSIS CATHETER;  Surgeon: Angelia Mould, MD;  Location: [facility];  Service: Vascular;  Laterality: Right;  . LEFT AND RIGHT HEART CATHETERIZATION WITH CORONARY ANGIOGRAM  12/29/2012   Procedure: LEFT AND RIGHT HEART CATHETERIZATION WITH CORONARY ANGIOGRAM;  Surgeon: Sherren Mocha, MD;  Location: [facility];  Service: Cardiovascular;;  . POLYPECTOMY  08/01/2019   Procedure: POLYPECTOMY;  Surgeon: Mauri Pole, MD;  Location: [facility];  Service: Endoscopy;;  . TRANSTHORACIC ECHOCARDIOGRAM  12/29/2012   EF 25% with periapical HK/AKA.  --> Followup echo 7/25 -- EF of 35%  . TRANSTHORACIC ECHOCARDIOGRAM  01/2013 ; 07/2015   a) EF 55-60%. No regional WMA. Grade 1 diastolic dysfunction. Mild LA dilatation.; b) Normal LV size with mild LV hypertrophy. EF 55-60%. Normal RV    His Family History Is Significant For: Family History  Problem Relation Potts of Onset  . Cancer Mother        Liver cancer  . Cancer Father        Leukemia  . Cancer Maternal Grandmother        Liver cancer  . Cancer Paternal Grandmother        Colon cancer  . Colon cancer Neg Hx     His Social History Is Significant For: Social History   Socioeconomic History  . Marital status: Married    Spouse name: Not on file  . Number of children: Not on file  . Years of education: Not on file  . Highest education level: Not on file  Occupational History  . Not on file  Tobacco Use  . Smoking status: Former  Smoker    Quit date: 06/20/1986    Years since quitting: 33.1  . Smokeless tobacco: Never Used  Substance and Sexual Activity  . Alcohol use: Yes    Comment: History of heavy use but only social at present.  10/29/15 - "a beer every week or so"  . Drug use: No    Comment: Prior cocaine use but none since 2000  . Sexual activity: Never  Other Topics Concern  . Not on file  Social History Narrative   He is widowed for 13 years, and has 2 daughters: [name] & [address] [name], and one son Lexington Dohrn. the other history is daughter who is here with him today.    He works for [employer] and [employer] as a [employer].   He quit smoking  in 1988. He does drink alcohol socially, but he does have a history of heavy use in the past. He also has a prior history of cocaine use but none since 2000.   Social Determinants of Health   Financial Resource Strain:   . Difficulty of Paying Living Expenses: Not on file  Food Insecurity:   . Worried About Charity fundraiser in the Last Year: Not on file  . Ran Out of Food in the Last Year: Not on file  Transportation Needs:   . Lack of Transportation (Medical): Not on file  . Lack of Transportation (Non-Medical): Not on file  Physical Activity:   . Days of Exercise per Week: Not on file  . Minutes of Exercise per Session: Not on file  Stress:   . Feeling of Stress : Not on file  Social Connections:   . Frequency of Communication with Friends and Family: Not on file  . Frequency of Social Gatherings with Friends and Family: Not on file  . Attends Religious Services: Not on file  . Active Member of Clubs or Organizations: Not on file  . Attends Archivist Meetings: Not on file  . Marital Status: Not on file    His Allergies Are:  Allergies  Allergen Reactions  . Oxycodone     Oxygen levels drop  :   His Current Medications Are:  Outpatient Encounter Medications as of 08/09/2019  Medication Sig  .  aspirin EC 81 MG tablet Take 1 tablet (81 mg total) by mouth daily.  . Multiple Vitamin (MULTIVITAMIN WITH MINERALS) TABS tablet Take 1 tablet by mouth daily.   No facility-administered encounter medications on file as of 08/09/2019.  :  Review of Systems:  Out of a complete 14 point review of systems, all are reviewed and negative with the exception of these symptoms as listed below: Review of Systems  Neurological:       Pt here for sleep consult. Reports last sleep study was in 2014.. reports he does use cpap machine. Reports machine is over 58 years old and would like to discuss getting a new one.   Epworth Sleepiness Scale 0= would never doze 1= slight chance of dozing 2= moderate chance of dozing 3= high chance of dozing  Sitting and reading:2 Watching TV:3 Sitting inactive in a public place (ex. Theater or meeting):0 As a passenger in a car for an hour without a break:0 Lying down to rest in the afternoon:2 Sitting and talking to someone:0 Sitting quietly after lunch (no alcohol):1 In a car, while stopped in traffic:0 Total:8     Objective:  Neurological Exam  Physical Exam Physical Examination:   Vitals:   08/09/19 1551  BP: 123/70  Pulse: 88  Temp: 97.8 F (36.6 C)    General Examination: The patient is a very pleasant 59 y.o. male in no acute distress. He appears well-developed and well-nourished and well groomed.   HEENT: Normocephalic, atraumatic, pupils are equal, round and reactive to light, extraocular tracking is good without limitation to gaze excursion or nystagmus noted. Hearing is grossly intact. Face is symmetric with normal facial animation. Speech is clear with no dysarthria noted. There is no hypophonia. There is no lip, neck/head, jaw or voice tremor. Neck is supple with full range of passive and active motion. There are no carotid bruits on auscultation. Oropharynx exam reveals: mild mouth dryness, adequate dental hygiene and moderate airway  crowding, due to Smaller airway entry and redundant soft palate, Mallampati  class II, tonsils on the smaller side, neck circumference 20-1/4 inches, tongue protrudes centrally in palate elevates symmetrically.  Chest: Clear to auscultation without wheezing, rhonchi or crackles noted.  Heart: S1+S2+0, regular and normal without murmurs, rubs or gallops noted.   Abdomen: Soft, non-tender and non-distended with normal bowel sounds appreciated on auscultation.  Extremities: There is trace pitting edema in the distal lower extremities bilaterally.   Skin: Warm and dry without trophic changes noted.   Musculoskeletal: exam reveals no obvious joint deformities, tenderness or joint swelling or erythema.   Neurologically:  Mental status: The patient is awake, alert and oriented in all 4 spheres. His immediate and remote memory, attention, language skills and fund of knowledge are appropriate. There is no evidence of aphasia, agnosia, apraxia or anomia. Speech is clear with normal prosody and enunciation. Thought process is linear. Mood is normal and affect is normal.  Cranial nerves II - XII are as described above under HEENT exam.  Motor exam: Normal bulk, strength and tone is noted. There is no tremor, Romberg is negative. Fine motor skills and coordination: grossly intact.  Cerebellar testing: No dysmetria or intention tremor. There is no truncal or gait ataxia.  Sensory exam: intact to light touch in the upper and lower extremities.  Gait, station and balance: He stands easily. No veering to one side is noted. No leaning to one side is noted. Posture is Potts-appropriate and stance is narrow based. Gait shows normal stride length and normal pace. No problems turning are noted. Tandem walk is somewhat challenging but doable.                 Assessment and Plan:  In summary, Robi Warstler is a very pleasant 60 y.o.-year old male with an underlying medical history of cardiomyopathy,  hypertension, hyperlipidemia, stroke, accidental opioid overdose in 2014 with acute respiratory failure, thrombocytopenia and morbid obesity with a BMI of over 38, who Presents for evaluation of his prior diagnosis of obstructive sleep apnea in the severe range. He has gained some weight over time. We should proceed with reevaluation with an updated sleep study.He should qualify for new equipment. He has been up-to-date with his supplies through his DME company, [employer].He continues to benefit from AutoPap therapy and is compliant, he is commended for his treatment adherence. with a history and physical exam concerning for obstructive sleep apnea (OSA). I had a long chat with the patient and his son about my findings and the diagnosis of OSA, its prognosis and treatment options. We talked about medical treatments, surgical interventions and non-pharmacological approaches. I explained in particular the risks and ramifications of untreated moderate to severe OSA, especially with respect to developing cardiovascular disease down the Road, including congestive heart failure, difficult to treat hypertension, cardiac arrhythmias, or stroke. Even type 2 diabetes has, in part, been linked to untreated OSA. Symptoms of untreated OSA include daytime sleepiness, memory problems, mood irritability and mood disorder such as depression and anxiety, lack of energy, as well as recurrent headaches, especially morning headaches. We talked about trying to maintain a healthy lifestyle in general, as well as the importance of weight control. I recommended the following at this time: sleep study. He is motivated to continue with CPAP or AutoPap therapy. He has been using a full facemask without major problems. I plan to see him back after sleep study testing. I answered all the questions today and the patient and his son were in agreement. Thank you very much for  allowing me to participate in the care of this nice patient. If  I can be of any further assistance to you please do not hesitate to call me at [phone].  Sincerely,   Nicholas Age, MD, PhD

## 2019-08-09 NOTE — Patient Instructions (Addendum)

## 2019-08-31 ENCOUNTER — Ambulatory Visit: Payer: BLUE CROSS/BLUE SHIELD | Admitting: Cardiology

## 2019-09-04 ENCOUNTER — Ambulatory Visit (INDEPENDENT_AMBULATORY_CARE_PROVIDER_SITE_OTHER): Payer: BC Managed Care – PPO | Admitting: Neurology

## 2019-09-04 DIAGNOSIS — G4720 Circadian rhythm sleep disorder, unspecified type: Secondary | ICD-10-CM

## 2019-09-04 DIAGNOSIS — G4733 Obstructive sleep apnea (adult) (pediatric): Secondary | ICD-10-CM | POA: Diagnosis not present

## 2019-09-04 DIAGNOSIS — R519 Headache, unspecified: Secondary | ICD-10-CM

## 2019-09-04 DIAGNOSIS — R635 Abnormal weight gain: Secondary | ICD-10-CM

## 2019-09-11 NOTE — Progress Notes (Deleted)
Cardiology Office Note   Date:  09/11/2019   ID:  Nicholas Potts, DOB 31-Jan-1961, MRN [id number]  PCP:  [name], MD  Cardiologist:  Dr. Ellyn Hack No chief complaint on file.    History of Present Illness: Nicholas Potts is a 59 y.o. male who presents for ongoing assessment and management of HTN, hx of cardio/respiratory arrest due to unintentional drug overdose, HL, with other history to include OSA on CPAP.       Past Medical History:  Diagnosis Date  . 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[place]) 2014   post op   . ARF (acute respiratory failure) ([place]) 2014  . Blood dyscrasia   . Chronic kidney disease 2014   ARF   . Complication of anesthesia 2014   - post op - in combination of pain pills-  Cardiac and Respiratory Arrest- prior to sleep apnea diagonosis  . Constipation   . CVA (cerebral infarction)   . Hyperlipidemia   . Hypertension   . OSA (obstructive sleep apnea)   . Sleep apnea   . Stress-induced cardiomyopathy -- essentially resolved    Post-op Narcotic induced Acute Hypoxic respiratory failure with respiratory and PEA cardiac arrest following accidental narcotic overdose; initial [place] 20-20%, now improved to 55%   Was supported with Impella Cardiac Cath - non-obstructive CAD   . Thrombocytopenia ([place]) 2014    Past Surgical History:  Procedure Laterality Date  . ACHILLES TENDON REPAIR    . BIOPSY  08/01/2019   Procedure: BIOPSY;  Surgeon: Mauri Pole, MD;  Location: [facility];  Service: Endoscopy;;  . CARDIAC CATHETERIZATION  12/29/2012   RN LHC --> Impella. for severe shock with low EF. Mild to moderate RV pressure elevation. Normal coronary arteries. Cardiac Output 2.5, Index 1.46.  Marland Kitchen COLONOSCOPY WITH PROPOFOL N/A 10/30/2015   Procedure: COLONOSCOPY WITH PROPOFOL;  Surgeon: Mauri Pole, MD;  Location: [facility];  Service: Endoscopy;  Laterality: N/A;  . COLONOSCOPY WITH PROPOFOL N/A 08/01/2019   Procedure: COLONOSCOPY WITH  PROPOFOL;  Surgeon: Mauri Pole, MD;  Location: [facility];  Service: Endoscopy;  Laterality: N/A;  . INSERTION OF DIALYSIS CATHETER Right 01/09/2013   Procedure: INSERTION OF DIALYSIS CATHETER;  Surgeon: Angelia Mould, MD;  Location: [facility];  Service: Vascular;  Laterality: Right;  . LEFT AND RIGHT HEART CATHETERIZATION WITH CORONARY ANGIOGRAM  12/29/2012   Procedure: LEFT AND RIGHT HEART CATHETERIZATION WITH CORONARY ANGIOGRAM;  Surgeon: Sherren Mocha, MD;  Location: [facility] CATH LAB;  Service: Cardiovascular;;  . POLYPECTOMY  08/01/2019   Procedure: POLYPECTOMY;  Surgeon: Mauri Pole, MD;  Location: [facility];  Service: Endoscopy;;  . TRANSTHORACIC ECHOCARDIOGRAM  12/29/2012   EF 25% with periapical HK/AKA.  --> Followup echo 7/25 -- EF of 35%  . TRANSTHORACIC ECHOCARDIOGRAM  01/2013 ; 07/2015   a) EF 55-60%. No regional WMA. Grade 1 diastolic dysfunction. Mild LA dilatation.; b) Normal LV size with mild LV hypertrophy. EF 55-60%. Normal RV     Current Outpatient Medications  Medication Sig Dispense Refill  . aspirin EC 81 MG tablet Take 1 tablet (81 mg total) by mouth daily. 90 tablet 3  . Multiple Vitamin (MULTIVITAMIN WITH MINERALS) TABS tablet Take 1 tablet by mouth daily.     No current facility-administered medications for this visit.    Allergies:   Oxycodone    Social History:  The patient  reports that he quit smoking about 33 years ago. He has never used smokeless tobacco. He reports current  alcohol use. He reports that he does not use drugs.   Family History:  The patient's family history includes Cancer in his father, maternal grandmother, mother, and paternal grandmother.    ROS: All other systems are reviewed and negative. Unless otherwise mentioned in H&P    PHYSICAL EXAM: VS:  There were no vitals taken for this visit. , BMI There is no height or weight on file to calculate BMI. GEN: Well nourished, well developed, in no acute distress HEENT:  normal Neck: no JVD, carotid bruits, or masses Cardiac: ***RRR; no murmurs, rubs, or gallops,no edema  Respiratory:  Clear to auscultation bilaterally, normal work of breathing GI: soft, nontender, nondistended, + BS MS: no deformity or atrophy Skin: warm and dry, no rash Neuro:  Strength and sensation are intact Psych: euthymic mood, full affect   EKG:  EKG {ACTION; IS/IS [id number] ordered today. The ekg ordered today demonstrates ***   Recent Labs: 07/13/2019: ALT 22; BUN 13; Creatinine, Ser 1.03; Hemoglobin 15.3; Potassium 4.4; Sodium 138; TSH 1.920    Lipid Panel    Component Value Date/Time   CHOL 217 (H) 07/13/2019 1539   TRIG 249 (H) 07/13/2019 1539   HDL 32 (L) 07/13/2019 1539   CHOLHDL 6.8 (H) 07/13/2019 1539   CHOLHDL 6.2 (H) 08/22/2016 0937   VLDL 33 (H) 08/22/2016 0937   LDLCALC 140 (H) 07/13/2019 1539      Wt Readings from Last 3 Encounters:  08/09/19 263 lb (119.3 kg)  08/01/19 260 lb (117.9 kg)  07/13/19 265 lb (120.2 kg)      Other studies Reviewed: Echocardiogram 04-08-18  Left ventricle: The cavity size was normal. Systolic function was  normal. The estimated ejection fraction was in the range of 55%  to 60%. Wall motion was normal; there were no regional wall  motion abnormalities.  - Aortic valve: There was trivial regurgitation.  - Pulmonary arteries: Systolic pressure could not be accurately  estimated.    ASSESSMENT AND PLAN:  1.  ***   Current medicines are reviewed at length with the patient today.  I have spent *** dedicated to the care of this patient on the date of this encounter to include pre-visit review of records, assessment, management and diagnostic testing,with shared decision making.  Labs/ tests ordered today include: *** Phill Myron. [place] Pugh, ANP, AACC   09/11/2019 2:15 PM    [facility] [place] [address] Office [phone] Fax [phone]  Notice: This dictation  was prepared with Dragon dictation along with smaller phrase technology. Any transcriptional errors that result from this process are unintentional and may not be corrected upon review.

## 2019-09-13 ENCOUNTER — Ambulatory Visit: Payer: BLUE CROSS/BLUE SHIELD | Admitting: Adult Health

## 2019-09-14 ENCOUNTER — Telehealth: Payer: Self-pay

## 2019-09-14 NOTE — Addendum Note (Signed)
Addended by: Star Age on: 09/14/2019 07:30 AM   Modules accepted: Orders

## 2019-09-14 NOTE — Progress Notes (Signed)
Patient referred by Dr. Carlota Raspberry for re-eval of OSA, seen by me on 08/09/19, PST on 09/04/19.    Please call patient or his son, who speaks English, and notify the patient that the recent home sleep test showed obstructive sleep apnea in the severe range. While I typically recommend treatment for this in the form CPAP, he has been using autoPAP at home successfully and I recommend, I prescribe a new autoPAP machine through his DME. The DME representative will educate him on how to use the machine. I have placed an order in the chart. Please send referral, talk to patient, send report to referring MD. We will need a FU in sleep clinic for 10 weeks post-PAP set up, please arrange that with me or one of our NPs. Thanks,   Nicholas Age, MD, PhD [facility])

## 2019-09-14 NOTE — Procedures (Signed)
PATIENT'S NAME:  Nicholas Potts, Nicholas Potts DOB:      [birth year]      MR#:    [id number]     DATE OF RECORDING: 09/04/2019 REFERRING M.D.:  Merri Ray, MD Study Performed:   Baseline Polysomnogram HISTORY: 59 year old man with a history of cardiomyopathy, hypertension, hyperlipidemia, stroke, failure, thrombocytopenia and morbid obesity with a BMI of over 40, who was diagnosed with severe obstructive sleep apnea in 2014.  He has been on AutoPap therapy. He is compliant with treatment. He presents for re-evaluation. The patient endorsed the Epworth Sleepiness Scale at 8/24 points. The patient's weight 263 pounds with a height of 67 (inches), resulting in a BMI of 41.2 kg/m2. The patient's neck circumference measured 20.2 inches.  CURRENT MEDICATIONS: Aspirin, Multivitamin.   PROCEDURE:  This is a multichannel digital polysomnogram utilizing the Somnostar 11.2 system.  Electrodes and sensors were applied and monitored per AASM Specifications.   EEG, EOG, Chin and Limb EMG, were sampled at 200 Hz.  ECG, Snore and Nasal Pressure, Thermal Airflow, Respiratory Effort, CPAP Flow and Pressure, Oximetry was sampled at 50 Hz. Digital video and audio were recorded.      BASELINE STUDY  Lights Out was at 22:34 and Lights On at 05:00.  Total recording time (TRT) was 387 minutes, with a total sleep time (TST) of 273.5 minutes.   The patient's sleep latency was 72.5 minutes, which is delayed.  REM latency was 214.5 minutes, which is markedly delayed. The sleep efficiency was 70.7 %.     SLEEP ARCHITECTURE: WASO (Wake after sleep onset) was 40.5 minutes with moderate sleep fragmentation noted. There were 12 minutes in Stage N1, 245 minutes Stage N2, 0 minutes Stage N3 and 16.5 minutes in Stage REM.  The percentage of Stage N1 was 4.4%, Stage N2 was 89.6%, which is markedly increased, Stage N3 was absent, and Stage R (REM sleep) was significantly reduced at 6.%.  The arousals were noted as: 35 were spontaneous,  0 were associated with PLMs, 82 were associated with respiratory events.  RESPIRATORY ANALYSIS:  There were a total of 309 respiratory events:  30 obstructive apneas, 7 central apneas and 0 mixed apneas with a total of 37 apneas and an apnea index (AI) of 8.1 /hour. There were 272 hypopneas with a hypopnea index of 59.7 /hour. The patient also had 0 respiratory event related arousals (RERAs).      The total APNEA/HYPOPNEA INDEX (AHI) was 67.8/hour and the total RESPIRATORY DISTURBANCE INDEX was  67.8 /hour.  15 events occurred in REM sleep and 535 events in NREM. The REM AHI was  54.5 /hour, versus a non-REM AHI of 68.6. The patient spent 31.5 minutes of total sleep time in the supine position and 242 minutes in non-supine.. The supine AHI was 72.4 versus a non-supine AHI of 67.2.  OXYGEN SATURATION & C02:  The Wake baseline 02 saturation was 90%, with the lowest being 78%. Time spent below 89% saturation equaled 135 minutes. PERIODIC LIMB MOVEMENTS: The patient had a total of 0 Periodic Limb Movements.  The Periodic Limb Movement (PLM) index was 0 and the PLM Arousal index was 0/hour.  Audio and video analysis did not show any abnormal or unusual movements, behaviors, phonations or vocalizations. The patient took no bathroom breaks. Moderate to loud snoring was noted. The EKG was in keeping with normal sinus rhythm (NSR).  IMPRESSION:  1. Severe Obstructive Sleep Apnea (OSA) 2. Dysfunctions associated with sleep stages or arousal from sleep  RECOMMENDATIONS:  1. This sleep study re-demonstrates severe obstructive sleep apnea with a total AHI of 67.8/hour and O2 nadir of 78%. The patient has been using an AutoPAP machine with good compliance. I will recommend a new autoPAP machine, so the patient can continue with treatment at home. A full night CPAP titration study may be needed down the road for optimization of therapy. Please note that untreated obstructive sleep apnea may carry additional  perioperative morbidity. Patients with significant obstructive sleep apnea should receive perioperative PAP therapy and the surgeons and particularly the anesthesiologist should be informed of the diagnosis and the severity of the sleep disordered breathing. 2. The patient should be cautioned not to drive, work at heights, or operate dangerous or heavy equipment when tired or sleepy. Review and reiteration of good sleep hygiene measures should be pursued with any patient. 3. This study shows sleep fragmentation and abnormal sleep stage percentages; these are nonspecific findings and per se do not signify an intrinsic sleep disorder or a cause for the patient's sleep-related symptoms. Causes include (but are not limited to) the first night effect of the sleep study, circadian rhythm disturbances, medication effect or an underlying mood disorder or medical problem.  4. The patient will be seen in follow-up in the sleep clinic at [facility] for discussion of the test results, symptom and treatment compliance review, further management strategies, etc. The referring provider will be notified of the test results.  I certify that I have reviewed the entire raw data recording prior to the issuance of this report in accordance with the Standards of Accreditation of the American Academy of Sleep Medicine (AASM)   Star Age, MD, PhD Diplomat, American Board of Neurology and Sleep Medicine (Neurology and Sleep Medicine)

## 2019-09-14 NOTE — Telephone Encounter (Signed)
I called pt's daughter, Verdis Frederickson, per DPR. No answer, left a message asking him to call me back. Of note, pt's son is not listed on his updated DPR, and therefore I cannot speak with him.

## 2019-09-14 NOTE — Telephone Encounter (Signed)
-----   Message from Star Age, MD sent at 09/14/2019  7:30 AM EDT ----- Patient referred by Dr. Carlota Raspberry for re-eval of OSA, seen by me on 08/09/19, PST on 09/04/19.    Please call patient or his son, who speaks English, and notify the patient that the recent home sleep test showed obstructive sleep apnea in the severe range. While I typically recommend treatment for this in the form CPAP, he has been using autoPAP at home successfully and I recommend, I prescribe a new autoPAP machine through his DME. The DME representative will educate him on how to use the machine. I have placed an order in the chart. Please send referral, talk to patient, send report to referring MD. We will need a FU in sleep clinic for 10 weeks post-PAP set up, please arrange that with me or one of our NPs. Thanks,   Star Age, MD, PhD [facility])

## 2019-09-18 NOTE — Telephone Encounter (Signed)
I called pt's daughter, Verdis Frederickson, per DPR, to discuss. No answer, left a message asking her to call me back.

## 2019-09-19 ENCOUNTER — Encounter: Payer: Self-pay | Admitting: General Practice

## 2019-09-21 NOTE — Telephone Encounter (Signed)
I called pt to discuss. No answer, left a message asking him to call me back. 

## 2019-09-25 NOTE — Telephone Encounter (Signed)
I called pt again to discuss. No answer, left a message asking him to call me back. This is my fourth unsuccessful attempt at reaching pt by phone. Pt's mychart message has not been read. Will also mail him a letter.

## 2019-09-26 ENCOUNTER — Telehealth: Payer: Self-pay | Admitting: Neurology

## 2019-09-26 NOTE — Telephone Encounter (Signed)
I called pt's son ( ok per dpr) I advised pt that Dr. Rexene Alberts reviewed their sleep study results and found that pt severe osa was found. Dr. Rexene Alberts  recommends that pt start an autopap. I reviewed PAP compliance expectations with the pt. Pt is agreeable to starting an auto-PAP. I advised pt that an order will be sent to a DME, [employer], and [employer] will call the pt within about one week after they file with the pt's insurance. [employer] will show the pt how to use the machine, fit for masks, and troubleshoot the auto-PAP if needed. A follow up appt was made for insurance purposes with Dr. Rexene Alberts on 12/05/2019 at 300pm. Pt verbalized understanding to arrive 15 minutes early and bring their auto-PAP. A letter with all of this information in it will be mailed to the pt as a reminder. I verified with the pt that the address we have on file is correct. Pt verbalized understanding of results. Pt had no questions at this time but was encouraged to call back if questions arise. I have sent the order to [facility] and have received confirmation that they have received the order.'

## 2019-09-26 NOTE — Telephone Encounter (Signed)
[name] is calling about the results of the pt's sleep study. He stated they now have a new number which is why Cyril Mourning was unable to contact pt. Please advise.

## 2019-09-26 NOTE — Telephone Encounter (Addendum)
I called pt's son ( ok per dpr). I advised pt that Dr. Rexene Alberts reviewed their sleep study results and found that pt severe osa was found. Dr. Rexene Alberts  recommends that pt start an autopap. I reviewed PAP compliance expectations with the pt. Pt is agreeable to starting an auto-PAP. I advised pt that an order will be sent to a DME, [employer], and [employer] will call the pt within about one week after they file with the pt's insurance. [employer] will show the pt how to use the machine, fit for masks, and troubleshoot the auto-PAP if needed. A follow up appt was made for insurance purposes with Dr. Rexene Alberts on 12/05/2019 at 300pm. Pt verbalized understanding to arrive 15 minutes early and bring their auto-PAP. A letter with all of this information in it will be mailed to the pt as a reminder. I verified with the pt that the address we have on file is correct. Pt verbalized understanding of results. Pt had no questions at this time but was encouraged to call back if questions arise. I have sent the order to [facility] and have received confirmation that they have received the order.

## 2019-10-27 ENCOUNTER — Encounter: Payer: Self-pay | Admitting: Internal Medicine

## 2019-12-04 ENCOUNTER — Telehealth: Payer: Self-pay

## 2019-12-04 NOTE — Telephone Encounter (Signed)
Left vm asking pt to bring autopap to office tomorrow for appt.   Lvm for pt's son.

## 2019-12-05 ENCOUNTER — Ambulatory Visit: Payer: BC Managed Care – PPO | Admitting: Neurology

## 2019-12-05 ENCOUNTER — Encounter: Payer: Self-pay | Admitting: Neurology

## 2019-12-05 ENCOUNTER — Other Ambulatory Visit: Payer: Self-pay

## 2019-12-05 DIAGNOSIS — Z9989 Dependence on other enabling machines and devices: Secondary | ICD-10-CM

## 2019-12-05 DIAGNOSIS — G4733 Obstructive sleep apnea (adult) (pediatric): Secondary | ICD-10-CM

## 2019-12-05 NOTE — Patient Instructions (Signed)
As discussed, we will have to reschedule your appointment for about a month out from now as you have not been on your AutoPap machine long enough.  We will reschedule your appointments and not charged for the visit today.  We will give you your co-pay back as well on your way out.  I am sorry it took so long for you to get your new AutoPap machine.  You are compliant with treatment and have been compliant with your previous machine.  The average pressure seems to be exactly the same as before, right at 15 cm.

## 2019-12-05 NOTE — Progress Notes (Signed)
Subjective:    Patient ID: Nicholas Potts is a 59 y.o. male.  HPI   I did not do a full examination today as the patient will need to return in about a month for compliance evaluation.  He started using his new AutoPap machine only on 11/29/2019.  I would like to be able to review 30 days worth of compliance data.  The patient is advised of this and we will not bill for this visit today.  Both the patient and his son were in agreement.

## 2020-01-16 ENCOUNTER — Ambulatory Visit: Payer: BC Managed Care – PPO | Admitting: Neurology

## 2020-02-13 ENCOUNTER — Ambulatory Visit: Payer: BC Managed Care – PPO | Admitting: Neurology

## 2020-03-19 ENCOUNTER — Ambulatory Visit: Payer: BC Managed Care – PPO | Admitting: Neurology

## 2020-04-08 ENCOUNTER — Telehealth: Payer: Self-pay

## 2020-04-08 NOTE — Telephone Encounter (Signed)
I called pt to remind him to bring his cpap to his appt tomorrow. [name] Jamian, not on [place], answered, advised me that pt is at work, and he will ask pt to call me back.  If pt calls back please ask him to bring his cpap to his appt tomorrow.

## 2020-04-09 ENCOUNTER — Other Ambulatory Visit: Payer: Self-pay

## 2020-04-09 ENCOUNTER — Encounter: Payer: Self-pay | Admitting: Neurology

## 2020-04-09 ENCOUNTER — Ambulatory Visit: Payer: BC Managed Care – PPO | Admitting: Neurology

## 2020-04-09 VITALS — BP 141/81 | HR 87 | Ht 67.5 in | Wt 264.0 lb

## 2020-04-09 DIAGNOSIS — G4733 Obstructive sleep apnea (adult) (pediatric): Secondary | ICD-10-CM

## 2020-04-09 DIAGNOSIS — Z9989 Dependence on other enabling machines and devices: Secondary | ICD-10-CM | POA: Diagnosis not present

## 2020-04-09 DIAGNOSIS — G4734 Idiopathic sleep related nonobstructive alveolar hypoventilation: Secondary | ICD-10-CM

## 2020-04-09 NOTE — Patient Instructions (Addendum)
Please continue using your autoPAP regularly. While your insurance requires that you use PAP at least 4 hours each night on 70% of the nights, I recommend, that you not skip any nights and use it throughout the night if you can. Getting used to PAP and staying with the treatment long term does take time and patience and discipline. Untreated obstructive sleep apnea when it is moderate to severe can have an adverse impact on cardiovascular health and raise her risk for heart disease, arrhythmias, hypertension, congestive heart failure, stroke and diabetes. Untreated obstructive sleep apnea causes sleep disruption, nonrestorative sleep, and sleep deprivation. This can have an impact on your day to day functioning and cause daytime sleepiness and impairment of cognitive function, memory loss, mood disturbance, and problems focussing. Using PAP regularly can improve these symptoms.  As discussed, we will do an overnight oxygen level test, called ONO, and your DME company will call and set this up for one night, while you also use your autoPAP as usual. We will call you with the results. This is to make sure that your oxygen levels stay in the 90s, while you are treated with autoPAP for your OSA. Remember, your oxygen levels dropped into the high 70s during the sleep study in March.   Since you have noticed increasing fatigue, please try to achieve about 7 to 8 hours of sleep on average night.  Please also follow-up with your primary care physician for checkup and make sure your thyroid function, vitamin D level, and vitamin B12 levels are okay and that you are not prediabetic or diabetic.  If all goes well, we can see you in 1 year, you can see one of our nurse practitioners.

## 2020-04-09 NOTE — Progress Notes (Signed)
Subjective:    Patient ID: Nicholas Potts is a 59 y.o. male.  HPI     Interim history:   Nicholas Potts is a 59 year old right-handed gentleman with an underlying medical history of cardiomyopathy, hypertension, hyperlipidemia, stroke, accidental opioid overdose in 2014 with acute respiratory failure, thrombocytopenia and morbid obesity with a BMI of over 40, who presents for FU consultation of his OSA, after sleep study evaluation and starting autoPAP with new equipment. The patient is accompanied by his daughter today; she helps with interpretation and they signed a waiver for language interpretation services. I first met him on 08/09/2019 at the request of his primary care physician, at which time the patient reported a prior diagnosis of OSA since 2014.  He had been on AutoPap therapy and qualified for new equipment.  I suggested we proceed with diagnostic testing in the form of a laboratory sleep study.  He had a baseline polysomnogram on 09/04/2019 which confirmed his diagnosis of sleep apnea in the severe range with an AHI of 67.8/h, O2 nadir of 78%.  I suggested we proceed with ongoing treatment with AutoPap with new equipment.  He presented for a follow-up appointment on 12/05/2019 but he had not been on his new equipment for more than 30 days at the time.  He was on his new AutoPap about a week at the time.  He was advised to reschedule his appointment and we did not do a full visit.  Today, 04/09/2020: I reviewed his AutoPap compliance data from 03/11/2020 through 04/09/2020, which is a total of 30 days, during which time he used his machine every night, of course tonight's sleep has not happened yet.  His compliance percentage for more than 4 hours usage is 93%, actually higher since it also includes tonight.  Average usage is about 6 hours and 9 minutes, leak on the low side with a 95th percentile at 5.3 L/min, average pressure for the 95th percentile at 14.5 cm with a range of 7 cm to 16 cm  with EPR.  Average AHI at goal at 0.6/h.  He reports that he sleeps an average of 6 hours on any given night.  He has noticed an increase in his fatigue level lately.  He has not had any new medications.  He has an upcoming physical with his primary care physician.  In February, his TSH was in the normal range, I reviewed blood work from February.  He has noticed that the machine sometimes cuts off randomly in the middle of the night and also turns back on after a few minutes.  He has not talked to his DME company yet.  He is compliant with treatment and feels better including better daytime energy and better sleep quality.  He is motivated to continue with treatment.  His weight has been stable.  He is trying to lose weight.  His daughter has noted noisy breathing.  The patient denies any shortness of breath.  He does have some allergy symptoms and sneezes in the mornings.  He has not tried any over-the-counter allergy medication.  The patient's allergies, current medications, family history, past medical history, past social history, past surgical history and problem list were reviewed and updated as appropriate.   Previously:   08/09/19: (He) was diagnosed with severe obstructive sleep apnea in 2014.  He has been on AutoPap therapy.  I reviewed your office note from 07/13/2019.  His Epworth sleepiness score is 8/24. He has seen Dr. Jannifer Franklin in our office for dizziness  and memory loss in 2018.  He was diagnosed with severe obstructive sleep apnea in September 2014, AHI was 79.7 at the time, O2 nadir 53%, he had a subsequent CPAP titration study on 03/22/2013. He has been on AutoPap therapy, I was able to review a download from 07/10/2019 through the 08/08/2019, during which time he used his machine every night with percent use days greater than 4 hours at 97%, indicating excellent compliance with an average usage of 6 hours and 9 minutes, residual AHI 1.2/h, average pressure for the 95th percentile at 15.8 cm with a  range of 5 cm to 20 cm, leak on the high side with a 95th percentile at 23 L/min. He reports using a full facemask. He reports full compliance with his machine, has had some morning headaches about a month ago but in the past 2 weeks he has not had any morning headaches. Bedtime is generally around 10 PM and rise time around 5 AM. He works in a [employer] features editor. He quit smoking some 32 years ago, drinks alcohol infrequently, caffeine in the form of coffee, 1 cup/day and 1 soda per day on average. He is widowed, wife died about 59 years ago in a car accident and he lives with his son and younger daughter, he has an older daughter as well. They have 1 cat in the household, not in his bedroom at night. He does have a TV in the bedroom but turns it off before falling asleep. He has gained weight over time. He is currently working on weight loss.His son reports that patient had a severe reaction to oxycodone some years ago and had gained a lot of water weight at the time.    His Past Medical History Is Significant For: Past Medical History:  Diagnosis Date  . 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[place]) 2014   post op   . ARF (acute respiratory failure) ([place]) 2014  . Blood dyscrasia   . Chronic kidney disease 2014   ARF   . Complication of anesthesia 2014   - post op - in combination of pain pills-  Cardiac and Respiratory Arrest- prior to sleep apnea diagonosis  . Constipation   . CVA (cerebral infarction)   . Hyperlipidemia   . Hypertension   . OSA (obstructive sleep apnea)   . Sleep apnea   . Stress-induced cardiomyopathy -- essentially resolved    Post-op Narcotic induced Acute Hypoxic respiratory failure with respiratory and PEA cardiac arrest following accidental narcotic overdose; initial [place] 20-20%, now improved to 55%   Was supported with Impella Cardiac Cath - non-obstructive CAD   . Thrombocytopenia ([place]) 2014    His Past Surgical History Is Significant For: Past Surgical History:   Procedure Laterality Date  . ACHILLES TENDON REPAIR    . BIOPSY  08/01/2019   Procedure: BIOPSY;  Surgeon: Mauri Pole, MD;  Location: [facility];  Service: Endoscopy;;  . CARDIAC CATHETERIZATION  12/29/2012   RN LHC --> Impella. for severe shock with low EF. Mild to moderate RV pressure elevation. Normal coronary arteries. Cardiac Output 2.5, Index 1.46.  Marland Kitchen COLONOSCOPY WITH PROPOFOL N/A 10/30/2015   Procedure: COLONOSCOPY WITH PROPOFOL;  Surgeon: Mauri Pole, MD;  Location: [facility];  Service: Endoscopy;  Laterality: N/A;  . COLONOSCOPY WITH PROPOFOL N/A 08/01/2019   Procedure: COLONOSCOPY WITH PROPOFOL;  Surgeon: Mauri Pole, MD;  Location: [facility];  Service: Endoscopy;  Laterality: N/A;  . INSERTION OF DIALYSIS CATHETER Right 01/09/2013   Procedure: INSERTION  OF DIALYSIS CATHETER;  Surgeon: Angelia Mould, MD;  Location: [facility];  Service: Vascular;  Laterality: Right;  . LEFT AND RIGHT HEART CATHETERIZATION WITH CORONARY ANGIOGRAM  12/29/2012   Procedure: LEFT AND RIGHT HEART CATHETERIZATION WITH CORONARY ANGIOGRAM;  Surgeon: Sherren Mocha, MD;  Location: [facility] CATH LAB;  Service: Cardiovascular;;  . POLYPECTOMY  08/01/2019   Procedure: POLYPECTOMY;  Surgeon: Mauri Pole, MD;  Location: [facility];  Service: Endoscopy;;  . TRANSTHORACIC ECHOCARDIOGRAM  12/29/2012   EF 25% with periapical HK/AKA.  --> Followup echo 7/25 -- EF of 35%  . TRANSTHORACIC ECHOCARDIOGRAM  01/2013 ; 07/2015   a) EF 55-60%. No regional WMA. Grade 1 diastolic dysfunction. Mild LA dilatation.; b) Normal LV size with mild LV hypertrophy. EF 55-60%. Normal RV    His Family History Is Significant For: Family History  Problem Relation Age of Onset  . Cancer Mother        Liver cancer  . Cancer Father        Leukemia  . Cancer Maternal Grandmother        Liver cancer  . Cancer Paternal Grandmother        Colon cancer  . Colon cancer Neg Hx     His Social History Is  Significant For: Social History   Socioeconomic History  . Marital status: Married    Spouse name: Not on file  . Number of children: Not on file  . Years of education: Not on file  . Highest education level: Not on file  Occupational History  . Not on file  Tobacco Use  . Smoking status: Former Smoker    Quit date: 06/20/1986    Years since quitting: 33.8  . Smokeless tobacco: Never Used  Substance and Sexual Activity  . Alcohol use: Yes    Comment: History of heavy use but only social at present.  10/29/15 - "a beer every week or so"  . Drug use: No    Comment: Prior cocaine use but none since 2000  . Sexual activity: Never  Other Topics Concern  . Not on file  Social History Narrative   He is widowed for 13 years, and has 2 daughters: [name] & [address] [name], and one son Alonte Wulff. the other history is daughter who is here with him today.    He works for [employer] as a [employer].   He quit smoking in 1988. He does drink alcohol socially, but he does have a history of heavy use in the past. He also has a prior history of cocaine use but none since 2000.   Social Determinants of Health   Financial Resource Strain:   . Difficulty of Paying Living Expenses: Not on file  Food Insecurity:   . Worried About Charity fundraiser in the Last Year: Not on file  . Ran Out of Food in the Last Year: Not on file  Transportation Needs:   . Lack of Transportation (Medical): Not on file  . Lack of Transportation (Non-Medical): Not on file  Physical Activity:   . Days of Exercise per Week: Not on file  . Minutes of Exercise per Session: Not on file  Stress:   . Feeling of Stress : Not on file  Social Connections:   . Frequency of Communication with Friends and Family: Not on file  . Frequency of Social Gatherings with Friends and Family: Not on file  . Attends Religious Services:  Not on file  . Active Member of Clubs or  Organizations: Not on file  . Attends Archivist Meetings: Not on file  . Marital Status: Not on file    His Allergies Are:  Allergies  Allergen Reactions  . Oxycodone     Oxygen levels drop  :   His Current Medications Are:  Outpatient Encounter Medications as of 04/09/2020  Medication Sig  . aspirin EC 81 MG tablet Take 1 tablet (81 mg total) by mouth daily.  . Multiple Vitamin (MULTIVITAMIN WITH MINERALS) TABS tablet Take 1 tablet by mouth daily.   No facility-administered encounter medications on file as of 04/09/2020.  :  Review of Systems:  Out of a complete 14 point review of systems, all are reviewed and negative with the exception of these symptoms as listed below: Review of Systems  Neurological:       Pt presents today to follow up on his auto pap. Pt reports that he feels much better while using this machine. Pt reports that the machine occasionally cuts off during the night.    Objective:  Neurological Exam  Physical Exam Physical Examination:   Vitals:   04/09/20 1358  BP: (!) 141/81  Pulse: 87    General Examination: The patient is a very pleasant 59 y.o. male in no acute distress. He appears well-developed and well-nourished and well groomed.   HEENT: Normocephalic, atraumatic, pupils are equal, round and reactive to light, extraocular tracking well preserved.  Hearing is grossly intact. Face is symmetric with normal facial animation. Speech is clear with no dysarthria noted. There is no hypophonia. There is no lip, neck/head, jaw or voice tremor. Neck shows full range of motion, no carotid bruits.  Airway examination shows mild mouth dryness, stable findings, tongue protrudes centrally and palate elevates symmetrically, nasal passages are tight, inferior turbinate hypertrophy is noted.    Chest: Clear to auscultation without wheezing, rhonchi or crackles noted.  Heart: S1+S2+0, regular and normal without murmurs, rubs or gallops noted.    Abdomen: Soft, non-tender and non-distended with normal bowel sounds appreciated on auscultation.  Extremities: There is no pitting edema in the distal lower extremities bilaterally.   Skin: Warm and dry without trophic changes noted.   Musculoskeletal: exam reveals no obvious joint deformities, tenderness or joint swelling or erythema.   Neurologically:  Mental status: The patient is awake, alert and oriented in all 4 spheres. His immediate and remote memory, attention, language skills and fund of knowledge are appropriate. There is no evidence of aphasia, agnosia, apraxia or anomia. Speech is clear with normal prosody and enunciation. Thought process is linear. Mood is normal and affect is normal.  Cranial nerves II - XII are as described above under HEENT exam.  Motor exam: Normal bulk, strength and tone is noted. There is no tremor, . Fine motor skills and coordination: grossly intact.  Cerebellar testing: No dysmetria or intention tremor. There is no truncal or gait ataxia.  Sensory exam: intact to light touch in the upper and lower extremities.  Gait, station and balance: He stands easily. No veering to one side is noted. No leaning to one side is noted. Posture is age-appropriate and stance is narrow based. Gait shows normal stride length and normal pace. No problems turning are noted.   Assessment and Plan:  In summary, Nicholas Potts is a very pleasant 59 year old male with an underlying medical history of cardiomyopathy, dizziness, hypertension, hyperlipidemia, stroke, accidental opioid overdose in 2014 with  acute respiratory failure, thrombocytopenia and morbid obesity with a BMI of over 40, who presents for follow-up consultation of his obstructive sleep apnea.  He presented with a prior diagnosis of severe obstructive sleep apnea.  He also reported weight gain over time.  He had a baseline sleep study in March 2021 which indicated severe obstructive sleep apnea with  an AHI of 67.8/h and O2 nadir of 78%.  His DME company has been [facility].  He had AutoPap therapy and has new equipment now since late June 2021.  He is compliant with treatment and indicates benefit from his AutoPap.  He has noticed that sometimes the machine turns off randomly.  He is advised to get in touch with his DME company, [employer] to troubleshoot.  In addition, I would like for him to proceed with an overnight pulse oximetry test while he is on AutoPap therapy to ensure his oxygen saturations are adequate as he had a desaturation nadir 78%.  He does report some recurrence of fatigue.  He has an appointment with his primary care for a physical and is advised to make sure his blood work is checked, I suggested he get his TSH checked and also vitamin B12, vitamin D, and numbers for diabetes or prediabetes.  We will call him with his oxygen saturation test result and hopefully follow him routinely in 1 year.  If he has any interim questions or concerns, they are encouraged to email through [facility] or call.  He is advised to follow-up to see one of our nurse practitioners in 1 year if all goes well.  I answered all the questions today and the patient and his daughter were in agreement.   I spent 30 minutes in total face-to-face time and in reviewing records during pre-charting, more than 50% of which was spent in counseling and coordination of care, reviewing test results, reviewing medications and treatment regimen and/or in discussing or reviewing the diagnosis of OSA, the prognosis and treatment options. Pertinent laboratory and imaging test results that were available during this visit with the patient were reviewed by me and considered in my medical decision making (see chart for details).

## 2020-04-09 NOTE — Progress Notes (Unsigned)
Pt signed waiver for interpreter services and gave permission for his daughter Thomasene Ripple to interpret for him. Waiver sent to MR for scanning.

## 2020-04-09 NOTE — Progress Notes (Signed)
Order for ONO on autopap sent to [facility] via [employer]. Confirmation received that the order transmitted was successful.

## 2020-05-14 ENCOUNTER — Telehealth: Payer: Self-pay

## 2020-05-14 DIAGNOSIS — G4733 Obstructive sleep apnea (adult) (pediatric): Secondary | ICD-10-CM

## 2020-05-14 DIAGNOSIS — G4734 Idiopathic sleep related nonobstructive alveolar hypoventilation: Secondary | ICD-10-CM

## 2020-05-14 NOTE — Telephone Encounter (Signed)
I called pt's daughter, per DPR, to discuss. No answer, left a message asking her to call me back.

## 2020-05-14 NOTE — Telephone Encounter (Signed)
Pt's daughter, Verdis Frederickson, per DPR, returned my call. She confirmed that pt was using his auto-pap the night of his ONO. I discussed pt's ONO results and recommendations. I will send the order for cpap of 15 to [facility]. An appt was scheduled for 08/30/19 at 3:00pm with Amy, NP. Pt's daughter verbalized understanding of results and had no further questions.  Order for cpap pressure change sent to [facility] via community message. Confirmation received that the order transmitted was successful.

## 2020-05-14 NOTE — Addendum Note (Signed)
Addended by: Star Age on: 05/14/2020 08:16 AM   Modules accepted: Orders

## 2020-05-14 NOTE — Telephone Encounter (Signed)
Patient needs interpreter, but his son speaks [place] well.    Please advise patient that I reviewed his overnight pulse oximetry test result.  Testing was from 05/10/2020 through 05/11/2020, total testing time was 6 hours and 32 minutes.  He was supposed to be on his AutoPap.  Please verify that he was on his AutoPap all night especially in the beginning of the night of the oxygen test as he had desaturations particularly in the first hour of testing.  The lower oxygen saturations also correlated with higher pulse rate.  Average pulse rate was 69 bpm, highest 101, lowest 56 bpm.   Average oxygen saturation was good at 94.7%, lowest oxygen saturation was 81% and time below or at 88% saturation was 14.2 minutes.  I would like to suggest changing his AutoPap to a CPAP of 15 cm.  When he comes back for follow-up appointment with the nurse practitioner, we can not just review his CPAP compliance but also consider repeating his overnight pulse oximetry test while on CPAP therapy.  He has an appointment with Jinny Blossom in November next year.  Please make an appointment in about 3 months with one of our nurse practitioners for additional review and to repeat overnight pulse oximetry test, given the current results of his oxygen saturation.

## 2020-05-14 NOTE — Telephone Encounter (Signed)
Received ONO on CPAP results dated 05/10/2020-12/4/201.  Pt's cpap was downloaded via chip last visit. Unable to check cpap compliance that night remotely.

## 2020-05-20 ENCOUNTER — Ambulatory Visit: Payer: BC Managed Care – PPO | Admitting: Registered Nurse

## 2020-05-20 ENCOUNTER — Encounter: Payer: Self-pay | Admitting: Registered Nurse

## 2020-05-20 ENCOUNTER — Other Ambulatory Visit: Payer: Self-pay

## 2020-05-20 VITALS — BP 120/74 | HR 80 | Temp 98.4°F | Resp 18 | Ht 67.5 in | Wt 265.0 lb

## 2020-05-20 DIAGNOSIS — E785 Hyperlipidemia, unspecified: Secondary | ICD-10-CM | POA: Diagnosis not present

## 2020-05-20 DIAGNOSIS — G8929 Other chronic pain: Secondary | ICD-10-CM

## 2020-05-20 DIAGNOSIS — D124 Benign neoplasm of descending colon: Secondary | ICD-10-CM | POA: Diagnosis not present

## 2020-05-20 DIAGNOSIS — R5383 Other fatigue: Secondary | ICD-10-CM

## 2020-05-20 DIAGNOSIS — Z0000 Encounter for general adult medical examination without abnormal findings: Secondary | ICD-10-CM

## 2020-05-20 DIAGNOSIS — Z125 Encounter for screening for malignant neoplasm of prostate: Secondary | ICD-10-CM

## 2020-05-20 DIAGNOSIS — R519 Headache, unspecified: Secondary | ICD-10-CM

## 2020-05-20 DIAGNOSIS — Z0001 Encounter for general adult medical examination with abnormal findings: Secondary | ICD-10-CM | POA: Diagnosis not present

## 2020-05-20 DIAGNOSIS — I10 Essential (primary) hypertension: Secondary | ICD-10-CM | POA: Diagnosis not present

## 2020-05-20 DIAGNOSIS — Z23 Encounter for immunization: Secondary | ICD-10-CM

## 2020-05-20 DIAGNOSIS — L299 Pruritus, unspecified: Secondary | ICD-10-CM

## 2020-05-20 LAB — IFOBT (OCCULT BLOOD): IFOBT: NEGATIVE

## 2020-05-20 MED ORDER — TRIAMCINOLONE ACETONIDE 0.1 % EX CREA: 1.0000 "application " | TOPICAL_CREAM | Freq: Two times a day (BID) | CUTANEOUS | 0 refills | Status: DC

## 2020-05-20 NOTE — Patient Instructions (Signed)
° ° ° °  If you have lab work done today you will be contacted with your lab results within the next 2 weeks.  If you have not heard from us then please contact us. The fastest way to get your results is to register for My Chart. ° ° °IF you received an x-ray today, you will receive an invoice from [facility]. Please contact [facility] at [phone] with questions or concerns regarding your invoice.  ° °IF you received labwork today, you will receive an invoice from [facility]. Please contact [facility] at [phone] with questions or concerns regarding your invoice.  ° °Our billing staff will not be able to assist you with questions regarding bills from these companies. ° °You will be contacted with the lab results as soon as they are available. The fastest way to get your results is to activate your My Chart account. Instructions are located on the last page of this paperwork. If you have not heard from us regarding the results in 2 weeks, please contact this office. °  ° ° ° °

## 2020-05-20 NOTE — Progress Notes (Signed)
Established Patient Office Visit  Subjective:  Patient ID: Nicholas Potts, male    DOB: 11/02/1960  Age: 59 y.o. MRN: [id number]  CC:  Chief Complaint  Patient presents with  . Annual Exam    Patient states that he is here for and CPE. Per patient he would like to make sure he gets a Prostate exam , vitamin D and also TSH included.    HPI Michiah Potts presents for CPE and labs  No acute concerns  Requests recheck on tsh and vitamin d for fatigue  Here with daughter today who helps translate  Past Medical History:  Diagnosis Date  . 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[place]) 2014   post op   . ARF (acute respiratory failure) ([place]) 2014  . Blood dyscrasia   . Chronic kidney disease 2014   ARF   . Complication of anesthesia 2014   - post op - in combination of pain pills-  Cardiac and Respiratory Arrest- prior to sleep apnea diagonosis  . Constipation   . CVA (cerebral infarction)   . Hyperlipidemia   . Hypertension   . OSA (obstructive sleep apnea)   . Sleep apnea   . Stress-induced cardiomyopathy -- essentially resolved    Post-op Narcotic induced Acute Hypoxic respiratory failure with respiratory and PEA cardiac arrest following accidental narcotic overdose; initial [place] 20-20%, now improved to 55%   Was supported with Impella Cardiac Cath - non-obstructive CAD   . Thrombocytopenia ([place]) 2014    Past Surgical History:  Procedure Laterality Date  . ACHILLES TENDON REPAIR    . BIOPSY  08/01/2019   Procedure: BIOPSY;  Surgeon: Mauri Pole, MD;  [facility];  Service: Endoscopy;;  . CARDIAC CATHETERIZATION  12/29/2012   RN LHC --> Impella. for severe shock with low EF. Mild to moderate RV pressure elevation. Normal coronary arteries. Cardiac Output 2.5, Index 1.46.  Marland Kitchen COLONOSCOPY WITH PROPOFOL N/A 10/30/2015   Procedure: COLONOSCOPY WITH PROPOFOL;  Surgeon: Mauri Pole, MD;  Location: [facility];  Service: Endoscopy;  Laterality: N/A;  .  COLONOSCOPY WITH PROPOFOL N/A 08/01/2019   Procedure: COLONOSCOPY WITH PROPOFOL;  Surgeon: Mauri Pole, MD;  [facility];  Service: Endoscopy;  Laterality: N/A;  . INSERTION OF DIALYSIS CATHETER Right 01/09/2013   Procedure: INSERTION OF DIALYSIS CATHETER;  Surgeon: Angelia Mould, MD;  Location: [place];  Service: Vascular;  Laterality: Right;  . LEFT AND RIGHT HEART CATHETERIZATION WITH CORONARY ANGIOGRAM  12/29/2012   Procedure: LEFT AND RIGHT HEART CATHETERIZATION WITH CORONARY ANGIOGRAM;  Surgeon: Sherren Mocha, MD;  Location: [facility] CATH LAB;  Service: Cardiovascular;;  . POLYPECTOMY  08/01/2019   Procedure: POLYPECTOMY;  Surgeon: Mauri Pole, MD;  [facility];  Service: Endoscopy;;  . TRANSTHORACIC ECHOCARDIOGRAM  12/29/2012   EF 25% with periapical HK/AKA.  --> Followup echo 7/25 -- EF of 35%  . TRANSTHORACIC ECHOCARDIOGRAM  01/2013 ; 07/2015   a) EF 55-60%. No regional WMA. Grade 1 diastolic dysfunction. Mild LA dilatation.; b) Normal LV size with mild LV hypertrophy. EF 55-60%. Normal RV    Family History  Problem Relation Age of Onset  . Cancer Mother        Liver cancer  . Cancer Father        Leukemia  . Cancer Maternal Grandmother        Liver cancer  . Cancer Paternal Grandmother        Colon cancer  . Colon cancer Neg Hx  Social History   Socioeconomic History  . Marital status: Married    Spouse name: Not on file  . Number of children: Not on file  . Years of education: Not on file  . Highest education level: Not on file  Occupational History  . Not on file  Tobacco Use  . Smoking status: Former Smoker    Quit date: 06/20/1986    Years since quitting: 33.9  . Smokeless tobacco: Never Used  Substance and Sexual Activity  . Alcohol use: Yes    Comment: History of heavy use but only social at present.  10/29/15 - "a beer every week or so"  . Drug use: No    Comment: Prior cocaine use but none since 2000  . Sexual activity:  Never  Other Topics Concern  . Not on file  Social History Narrative   He is widowed for 13 years, and has 2 daughters: Nicholas Potts & [address] [name], and one son Nicholas Potts. the other history is daughter who is here with him today.    He works for [employer] as a [employer].   He quit smoking in 1988. He does drink alcohol socially, but he does have a history of heavy use in the past. He also has a prior history of cocaine use but none since 2000.   Social Determinants of Health   Financial Resource Strain: Not on file  Food Insecurity: Not on file  Transportation Needs: Not on file  Physical Activity: Not on file  Stress: Not on file  Social Connections: Not on file  Intimate Partner Violence: Not on file    Outpatient Medications Prior to Visit  Medication Sig Dispense Refill  . aspirin EC 81 MG tablet Take 1 tablet (81 mg total) by mouth daily. 90 tablet 3  . Multiple Vitamin (MULTIVITAMIN WITH MINERALS) TABS tablet Take 1 tablet by mouth daily.     No facility-administered medications prior to visit.    Allergies  Allergen Reactions  . Oxycodone     Oxygen levels drop    ROS Review of Systems  Constitutional: Negative.   HENT: Negative.   Eyes: Negative.   Respiratory: Negative.   Cardiovascular: Negative.   Gastrointestinal: Negative.   Genitourinary: Negative.   Musculoskeletal: Negative.   Skin: Negative.   Neurological: Negative.   Psychiatric/Behavioral: Negative.   All other systems reviewed and are negative.     Objective:    Physical Exam Vitals and nursing note reviewed.  Constitutional:      General: He is not in acute distress.    Appearance: Normal appearance. He is normal weight. He is not ill-appearing, toxic-appearing or diaphoretic.  HENT:     Head: Normocephalic and atraumatic.     Right Ear: Tympanic membrane, ear canal and external ear normal. There is no impacted cerumen.     Left Ear:  Tympanic membrane, ear canal and external ear normal. There is no impacted cerumen.     Nose: Nose normal. No congestion or rhinorrhea.     Mouth/Throat:     Mouth: Mucous membranes are moist.     Pharynx: Oropharynx is clear. No oropharyngeal exudate or posterior oropharyngeal erythema.  Eyes:     General: No scleral icterus.       Right eye: No discharge.        Left eye: No discharge.     Extraocular Movements: Extraocular movements intact.     Conjunctiva/sclera: Conjunctivae normal.  Pupils: Pupils are equal, round, and reactive to light.  Neck:     Vascular: No carotid bruit.  Cardiovascular:     Rate and Rhythm: Normal rate and regular rhythm.     Pulses: Normal pulses.     Heart sounds: Normal heart sounds. No murmur heard. No friction rub. No gallop.   Pulmonary:     Effort: Pulmonary effort is normal. No respiratory distress.     Breath sounds: Normal breath sounds. No stridor. No wheezing, rhonchi or rales.  Chest:     Chest wall: No tenderness.  Abdominal:     General: Abdomen is flat. Bowel sounds are normal. There is no distension.     Palpations: Abdomen is soft. There is no mass.     Tenderness: There is no abdominal tenderness. There is no right CVA tenderness, left CVA tenderness, guarding or rebound.     Hernia: No hernia is present.  Genitourinary:    Prostate: Normal. Not enlarged, not tender and no nodules present.     Rectum: Normal. Guaiac result negative. No mass or tenderness.  Musculoskeletal:        General: No swelling, tenderness, deformity or signs of injury. Normal range of motion.     Cervical back: Normal range of motion and neck supple. No rigidity or tenderness.     Right lower leg: No edema.     Left lower leg: No edema.  Lymphadenopathy:     Cervical: No cervical adenopathy.  Skin:    General: Skin is warm and dry.     Capillary Refill: Capillary refill takes less than 2 seconds.     Coloration: Skin is not jaundiced or pale.      Findings: No bruising, erythema, lesion or rash.  Neurological:     General: No focal deficit present.     Mental Status: He is alert and oriented to person, place, and time. Mental status is at baseline.     Cranial Nerves: No cranial nerve deficit.     Motor: No weakness.     Gait: Gait normal.  Psychiatric:        Mood and Affect: Mood normal.        Behavior: Behavior normal.        Thought Content: Thought content normal.        Judgment: Judgment normal.     BP 120/74   Pulse 80   Temp 98.4 F (36.9 C) (Temporal)   Resp 18   Ht 5' 7.5" (1.715 m)   Wt 265 lb (120.2 kg)   SpO2 96%   BMI 40.89 kg/m  Wt Readings from Last 3 Encounters:  05/20/20 265 lb (120.2 kg)  04/09/20 264 lb (119.7 kg)  08/09/19 263 lb (119.3 kg)     Health Maintenance Due  Topic Date Due  . INFLUENZA VACCINE  01/07/2020    There are no preventive care reminders to display for this patient.  Lab Results  Component Value Date   TSH 1.920 07/13/2019   Lab Results  Component Value Date   WBC 8.9 07/13/2019   HGB 15.3 (A) 07/13/2019   HCT 45.2 (A) 07/13/2019   MCV 95.6 07/13/2019   PLT 279 10/21/2017   Lab Results  Component Value Date   NA 138 07/13/2019   K 4.4 07/13/2019   CO2 21 07/13/2019   GLUCOSE 84 07/13/2019   BUN 13 07/13/2019   CREATININE 1.03 07/13/2019   BILITOT 0.3 07/13/2019   ALKPHOS 91 07/13/2019  AST 22 07/13/2019   ALT 22 07/13/2019   PROT 6.8 07/13/2019   ALBUMIN 4.2 07/13/2019   CALCIUM 9.3 07/13/2019   Lab Results  Component Value Date   CHOL 217 (H) 07/13/2019   Lab Results  Component Value Date   HDL 32 (L) 07/13/2019   Lab Results  Component Value Date   LDLCALC 140 (H) 07/13/2019   Lab Results  Component Value Date   TRIG 249 (H) 07/13/2019   Lab Results  Component Value Date   CHOLHDL 6.8 (H) 07/13/2019   Lab Results  Component Value Date   HGBA1C 5.1 08/22/2016      Assessment & Plan:   Problem List Items Addressed This  Visit      Cardiovascular and Mediastinum   Essential hypertension (Chronic)   Relevant Orders   CBC With Differential   Comprehensive metabolic panel     Digestive   Polyp of descending colon   Relevant Orders   IFOBT POC (occult bld, rslt in office) (Completed)     Other   Hyperlipidemia (Chronic)   Relevant Orders   Hemoglobin A1c   Lipid panel    Other Visit Diagnoses    Annual physical exam    -  Primary   Screening PSA (prostate specific antigen)       Relevant Orders   PSA   Fatigue, unspecified type       Relevant Orders   Flu Vaccine QUAD 6+ mos PF IM (Fluarix Quad PF)   TSH   Vitamin D, 25-hydroxy   Flu vaccine need       Chronic nonintractable headache, unspecified headache type       Relevant Orders   Vitamin B12      No orders of the defined types were placed in this encounter.   Follow-up: No follow-ups on file.   PLAN  Exam unremarkable  Labs collected. Will follow up with the patient as warranted.  Continue follow up with specialists  Will reach out to GI to facilitate scheduling of colonoscopy  Patient encouraged to call clinic with any questions, comments, or concerns.  Maximiano Coss, NP

## 2020-05-21 LAB — CBC WITH DIFFERENTIAL
Basophils Absolute: 0.1 10*3/uL (ref 0.0–0.2)
Basos: 1 %
EOS (ABSOLUTE): 0.1 10*3/uL (ref 0.0–0.4)
Eos: 1 %
Hematocrit: 44.8 % (ref 37.5–51.0)
Hemoglobin: 15.3 g/dL (ref 13.0–17.7)
Immature Grans (Abs): 0.1 10*3/uL (ref 0.0–0.1)
Immature Granulocytes: 1 %
Lymphocytes Absolute: 2.2 10*3/uL (ref 0.7–3.1)
Lymphs: 24 %
MCH: 32.7 pg (ref 26.6–33.0)
MCHC: 34.2 g/dL (ref 31.5–35.7)
MCV: 96 fL (ref 79–97)
Monocytes Absolute: 0.7 10*3/uL (ref 0.1–0.9)
Monocytes: 7 %
Neutrophils Absolute: 6.2 10*3/uL (ref 1.4–7.0)
Neutrophils: 66 %
RBC: 4.68 x10E6/uL (ref 4.14–5.80)
RDW: 12.2 % (ref 11.6–15.4)
WBC: 9.2 10*3/uL (ref 3.4–10.8)

## 2020-05-21 LAB — COMPREHENSIVE METABOLIC PANEL
ALT: 26 IU/L (ref 0–44)
AST: 25 IU/L (ref 0–40)
Albumin/Globulin Ratio: 1.7 (ref 1.2–2.2)
Albumin: 4.4 g/dL (ref 3.8–4.9)
Alkaline Phosphatase: 97 IU/L (ref 44–121)
BUN/Creatinine Ratio: 14 (ref 10–24)
BUN: 13 mg/dL (ref 8–27)
Bilirubin Total: 0.2 mg/dL (ref 0.0–1.2)
CO2: 23 mmol/L (ref 20–29)
Calcium: 9.4 mg/dL (ref 8.6–10.2)
Chloride: 104 mmol/L (ref 96–106)
Creatinine, Ser: 0.9 mg/dL (ref 0.76–1.27)
GFR calc Af Amer: 107 mL/min/{1.73_m2} (ref >59)
GFR calc non Af Amer: 93 mL/min/{1.73_m2} (ref >59)
Globulin, Total: 2.6 g/dL (ref 1.5–4.5)
Glucose: 110 mg/dL — ABNORMAL HIGH (ref 65–99)
Potassium: 4.4 mmol/L (ref 3.5–5.2)
Sodium: 140 mmol/L (ref 134–144)
Total Protein: 7 g/dL (ref 6.0–8.5)

## 2020-05-21 LAB — VITAMIN D 25 HYDROXY (VIT D DEFICIENCY, FRACTURES): Vit D, 25-Hydroxy: 29.8 ng/mL — ABNORMAL LOW (ref 30.0–100.0)

## 2020-05-21 LAB — HEMOGLOBIN A1C
Est. average glucose Bld gHb Est-mCnc: 120 mg/dL
Hgb A1c MFr Bld: 5.8 % — ABNORMAL HIGH (ref 4.8–5.6)

## 2020-05-21 LAB — LIPID PANEL
Chol/HDL Ratio: 5.9 ratio — ABNORMAL HIGH (ref 0.0–5.0)
Cholesterol, Total: 214 mg/dL — ABNORMAL HIGH (ref 100–199)
HDL: 36 mg/dL — ABNORMAL LOW (ref >39)
LDL Chol Calc (NIH): 136 mg/dL — ABNORMAL HIGH (ref 0–99)
Triglycerides: 233 mg/dL — ABNORMAL HIGH (ref 0–149)
VLDL Cholesterol Cal: 42 mg/dL — ABNORMAL HIGH (ref 5–40)

## 2020-05-21 LAB — PSA: Prostate Specific Ag, Serum: 0.7 ng/mL (ref 0.0–4.0)

## 2020-05-21 LAB — VITAMIN B12: Vitamin B-12: 645 pg/mL (ref 232–1245)

## 2020-05-21 LAB — TSH: TSH: 2.01 u[IU]/mL (ref 0.450–4.500)

## 2020-07-11 ENCOUNTER — Encounter: Payer: Self-pay | Admitting: Neurology

## 2020-07-18 ENCOUNTER — Telehealth: Payer: Self-pay | Admitting: Gastroenterology

## 2020-07-19 ENCOUNTER — Other Ambulatory Visit: Payer: Self-pay

## 2020-07-19 DIAGNOSIS — Z86010 Personal history of colonic polyps: Secondary | ICD-10-CM

## 2020-07-19 MED ORDER — NA SULFATE-K SULFATE-MG SULF 17.5-3.13-1.6 GM/177ML PO SOLN: ORAL | 0 refills | Status: DC

## 2020-07-19 NOTE — Telephone Encounter (Signed)
Spoke with daughter. Patient will go to [facility] testing 08/01/20 at 2:50 pm. Colonoscopy will be on 08/05/20 at 12:15 pm. Written instruction through My Chart and the mail.

## 2020-07-29 NOTE — Progress Notes (Signed)
Attempted to obtain medical history via telephone, unable to reach at this time. I left a voicemail to return pre surgical testing department's phone call.  

## 2020-08-01 ENCOUNTER — Other Ambulatory Visit (HOSPITAL_COMMUNITY)
Admission: RE | Admit: 2020-08-01 | Discharge: 2020-08-01 | Disposition: A | Discharge status: Home / Self Care | Payer: BC Managed Care – PPO | Source: Ambulatory Visit | Attending: Gastroenterology | Admitting: Gastroenterology

## 2020-08-01 ENCOUNTER — Other Ambulatory Visit: Payer: Self-pay

## 2020-08-01 ENCOUNTER — Encounter (HOSPITAL_COMMUNITY): Payer: Self-pay | Admitting: Gastroenterology

## 2020-08-01 DIAGNOSIS — Z20822 Contact with and (suspected) exposure to covid-19: Secondary | ICD-10-CM | POA: Insufficient documentation

## 2020-08-01 DIAGNOSIS — Z01812 Encounter for preprocedural laboratory examination: Secondary | ICD-10-CM | POA: Diagnosis not present

## 2020-08-02 LAB — SARS CORONAVIRUS 2 (TAT 6-24 HRS): SARS Coronavirus 2: NEGATIVE

## 2020-08-05 ENCOUNTER — Other Ambulatory Visit: Payer: Self-pay

## 2020-08-05 ENCOUNTER — Encounter (HOSPITAL_COMMUNITY)
Admission: RE | Disposition: A | Discharge status: Home / Self Care | Payer: Self-pay | Source: Home / Self Care | Attending: Gastroenterology

## 2020-08-05 ENCOUNTER — Ambulatory Visit (HOSPITAL_COMMUNITY): Payer: BC Managed Care – PPO | Admitting: Anesthesiology

## 2020-08-05 ENCOUNTER — Encounter (HOSPITAL_COMMUNITY): Payer: Self-pay | Admitting: Gastroenterology

## 2020-08-05 ENCOUNTER — Ambulatory Visit (HOSPITAL_COMMUNITY)
Admission: RE | Admit: 2020-08-05 | Discharge: 2020-08-05 | Disposition: A | Discharge status: Home / Self Care | Payer: BC Managed Care – PPO | Attending: Gastroenterology | Admitting: Gastroenterology

## 2020-08-05 DIAGNOSIS — D123 Benign neoplasm of transverse colon: Secondary | ICD-10-CM | POA: Diagnosis not present

## 2020-08-05 DIAGNOSIS — Z7982 Long term (current) use of aspirin: Secondary | ICD-10-CM | POA: Diagnosis not present

## 2020-08-05 DIAGNOSIS — K635 Polyp of colon: Secondary | ICD-10-CM

## 2020-08-05 DIAGNOSIS — D122 Benign neoplasm of ascending colon: Secondary | ICD-10-CM | POA: Diagnosis not present

## 2020-08-05 DIAGNOSIS — K648 Other hemorrhoids: Secondary | ICD-10-CM | POA: Insufficient documentation

## 2020-08-05 DIAGNOSIS — Z885 Allergy status to narcotic agent status: Secondary | ICD-10-CM | POA: Insufficient documentation

## 2020-08-05 DIAGNOSIS — Z09 Encounter for follow-up examination after completed treatment for conditions other than malignant neoplasm: Secondary | ICD-10-CM | POA: Diagnosis present

## 2020-08-05 DIAGNOSIS — K6389 Other specified diseases of intestine: Secondary | ICD-10-CM | POA: Diagnosis not present

## 2020-08-05 DIAGNOSIS — D125 Benign neoplasm of sigmoid colon: Secondary | ICD-10-CM | POA: Diagnosis not present

## 2020-08-05 DIAGNOSIS — Z8673 Personal history of transient ischemic attack (TIA), and cerebral infarction without residual deficits: Secondary | ICD-10-CM | POA: Diagnosis not present

## 2020-08-05 DIAGNOSIS — D124 Benign neoplasm of descending colon: Secondary | ICD-10-CM | POA: Diagnosis not present

## 2020-08-05 DIAGNOSIS — D120 Benign neoplasm of cecum: Secondary | ICD-10-CM

## 2020-08-05 DIAGNOSIS — Z87891 Personal history of nicotine dependence: Secondary | ICD-10-CM | POA: Diagnosis not present

## 2020-08-05 DIAGNOSIS — K5730 Diverticulosis of large intestine without perforation or abscess without bleeding: Secondary | ICD-10-CM | POA: Diagnosis not present

## 2020-08-05 DIAGNOSIS — G4733 Obstructive sleep apnea (adult) (pediatric): Secondary | ICD-10-CM | POA: Diagnosis not present

## 2020-08-05 DIAGNOSIS — Z86010 Personal history of colonic polyps: Secondary | ICD-10-CM | POA: Diagnosis not present

## 2020-08-05 HISTORY — PX: POLYPECTOMY: SHX5525

## 2020-08-05 HISTORY — DX: Prediabetes: R73.03

## 2020-08-05 HISTORY — PX: COLONOSCOPY WITH PROPOFOL: SHX5780

## 2020-08-05 HISTORY — PX: BIOPSY: SHX5522

## 2020-08-05 HISTORY — DX: Other amnesia: R41.3

## 2020-08-05 SURGERY — COLONOSCOPY WITH PROPOFOL
Anesthesia: Monitor Anesthesia Care

## 2020-08-05 MED ORDER — LACTATED RINGERS IV SOLN
INTRAVENOUS | Status: DC | PRN
Start: 2020-08-05 — End: 2020-08-05

## 2020-08-05 MED ORDER — PROPOFOL 500 MG/50ML IV EMUL
INTRAVENOUS | Status: DC | PRN
  Administered 2020-08-05 (×2): 250 ug/kg/min via INTRAVENOUS

## 2020-08-05 MED ORDER — PROPOFOL 1000 MG/100ML IV EMUL
INTRAVENOUS | Status: AC
  Filled 2020-08-05: qty 100

## 2020-08-05 MED ORDER — PROPOFOL 500 MG/50ML IV EMUL
INTRAVENOUS | Status: AC
  Filled 2020-08-05: qty 50

## 2020-08-05 MED ORDER — SODIUM CHLORIDE 0.9 % IV SOLN: INTRAVENOUS | Status: DC

## 2020-08-05 SURGICAL SUPPLY — 21 items

## 2020-08-05 NOTE — Discharge Instructions (Signed)

## 2020-08-05 NOTE — Op Note (Signed)
[facility] Patient Name: Nicholas Potts Procedure Date: 08/05/2020 MRN: [id number] Attending MD: Mauri Pole , MD Date of Birth: [birth year] CSN: [id number] Age: 60 Admit Type: Outpatient Procedure:                Colonoscopy Indications:              High risk colon cancer surveillance: Personal                            history of colonic polyps, Surveillance: History of                            numerous (> 10) adenomas on last colonoscopy (< 3                            yrs), High risk colon cancer surveillance: Personal                            history of adenoma (10 mm or greater in size) Providers:                Mauri Pole, MD, Particia Nearing, RN, Faustina                            Mbumina, Technician Referring MD:              Medicines:                Monitored Anesthesia Care Complications:            No immediate complications. Estimated Blood Loss:     Estimated blood loss was minimal. Procedure:                Pre-Anesthesia Assessment:                           - Prior to the procedure, a History and Physical                            was performed, and patient medications and                            allergies were reviewed. The patient's tolerance of                            previous anesthesia was also reviewed. The risks                            and benefits of the procedure and the sedation                            options and risks were discussed with the patient.                            All questions were answered, and informed consent  was obtained. Prior Anticoagulants: The patient has                            taken no previous anticoagulant or antiplatelet                            agents. ASA Grade Assessment: III - A patient with                            severe systemic disease. After reviewing the risks                            and benefits, the patient was deemed in                             satisfactory condition to undergo the procedure.                           After obtaining informed consent, the colonoscope                            was passed under direct vision. Throughout the                            procedure, the patient's blood pressure, pulse, and                            oxygen saturations were monitored continuously. The                            [id number]-H190DL ([id number] Olympus pediatric colonscope                            was introduced through the anus and advanced to the                            the cecum, identified by appendiceal orifice and                            ileocecal valve. The colonoscopy was performed                            without difficulty. The patient tolerated the                            procedure well. The quality of the bowel                            preparation was good. The ileocecal valve,                            appendiceal orifice, and rectum were photographed. Scope In: 11:57:25 AM Scope Out: 12:24:43 PM Scope Withdrawal Time: 0 hours 21 minutes 9 seconds  Total Procedure Duration:  0 hours 27 minutes 18 seconds  Findings:      The perianal and digital rectal examinations were normal.      Eight sessile polyps were found in the descending colon, transverse       colon, ascending colon and cecum. The polyps were 1 to 2 mm in size.       These polyps were removed with a cold biopsy forceps. Resection and       retrieval were complete.      Three sessile polyps were found in the sigmoid colon, descending colon       and ascending colon. The polyps were 4 to 6 mm in size. These polyps       were removed with a cold snare. Resection and retrieval were complete.      A diffuse area of moderate melanosis was found in the entire colon.      Scattered small-mouthed diverticula were found in the sigmoid colon.      Non-bleeding internal hemorrhoids were found during retroflexion. The        hemorrhoids were medium-sized.      The exam was otherwise without abnormality. Impression:               - Eight 1 to 2 mm polyps in the descending colon,                            in the transverse colon, in the ascending colon and                            in the cecum, removed with a cold biopsy forceps.                            Resected and retrieved.                           - Three 4 to 6 mm polyps in the sigmoid colon, in                            the descending colon and in the ascending colon,                            removed with a cold snare. Resected and retrieved.                           - Melanosis in the colon.                           - Diverticulosis in the sigmoid colon.                           - Non-bleeding internal hemorrhoids.                           - The examination was otherwise normal. Moderate Sedation:      Not Applicable - Patient had care per Anesthesia. Recommendation:           - Patient has a contact number available for  emergencies. The signs and symptoms of potential                            delayed complications were discussed with the                            patient. Return to normal activities tomorrow.                            Written discharge instructions were provided to the                            patient.                           - Resume previous diet.                           - Continue present medications.                           - Await pathology results.                           - Repeat colonoscopy in 3 years for surveillance                            based on pathology results. Procedure Code(s):        --- Professional ---                           [phone], Colonoscopy, flexible; with removal of                            tumor(s), polyp(s), or other lesion(s) by snare                            technique                           45380, 24, Colonoscopy, flexible; with biopsy,                             single or multiple Diagnosis Code(s):        --- Professional ---                           K63.5, Polyp of colon                           K63.89, Other specified diseases of intestine                           K64.8, Other hemorrhoids                           Z86.010, Personal history of colonic polyps  K57.30, Diverticulosis of large intestine without                            perforation or abscess without bleeding CPT copyright 2019 American Medical Association. All rights reserved. The codes documented in this report are preliminary and upon coder review may  be revised to meet current compliance requirements. Mauri Pole, MD 08/05/2020 12:32:01 PM This report has been signed electronically. Number of Addenda: 0

## 2020-08-05 NOTE — H&P (Signed)
[facility] History and Physical   Primary Care Physician:  [name], MD   Reason for Procedure:   History of multiple adenomatous colon polyps >10  Plan:    Surveillance colonoscopy     HPI: Nicholas Potts is a 60 y.o. male here for surveillance colonoscopy for history of multiple adenomatous colon polyps. The risks and benefits as well as alternatives of endoscopic procedure(s) have been discussed and reviewed. All questions answered. The patient agrees to proceed.    Past Medical History:  Diagnosis Date  . 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[place]) 2014   post op   . ARF (acute respiratory failure) ([place]) 2014  . Blood dyscrasia   . Chronic kidney disease 2014   ARF   . Complication of anesthesia 2014   - post op - in combination of pain pills-  Cardiac and Respiratory Arrest- prior to sleep apnea diagonosis  . Constipation   . CVA (cerebral infarction)   . Hyperlipidemia   . Hypertension   . OSA (obstructive sleep apnea)   . Pre-diabetes   . Short-term memory loss   . Sleep apnea   . Stress-induced cardiomyopathy -- essentially resolved    Post-op Narcotic induced Acute Hypoxic respiratory failure with respiratory and PEA cardiac arrest following accidental narcotic overdose; initial [place] 20-20%, now improved to 55%   Was supported with Impella Cardiac Cath - non-obstructive CAD   . Thrombocytopenia ([place]) 2014    Past Surgical History:  Procedure Laterality Date  . ACHILLES TENDON REPAIR    . BIOPSY  08/01/2019   Procedure: BIOPSY;  Surgeon: Mauri Pole, MD;  [facility];  Service: Endoscopy;;  . CARDIAC CATHETERIZATION  12/29/2012   RN LHC --> Impella. for severe shock with low EF. Mild to moderate RV pressure elevation. Normal coronary arteries. Cardiac Output 2.5, Index 1.46.  Marland Kitchen COLONOSCOPY WITH PROPOFOL N/A 10/30/2015   Procedure: COLONOSCOPY WITH PROPOFOL;  Surgeon: Mauri Pole, MD;  Location: [facility];  Service:  Endoscopy;  Laterality: N/A;  . COLONOSCOPY WITH PROPOFOL N/A 08/01/2019   Procedure: COLONOSCOPY WITH PROPOFOL;  Surgeon: Mauri Pole, MD;  [facility];  Service: Endoscopy;  Laterality: N/A;  . INSERTION OF DIALYSIS CATHETER Right 01/09/2013   Procedure: INSERTION OF DIALYSIS CATHETER;  Surgeon: Angelia Mould, MD;  Location: [facility];  Service: Vascular;  Laterality: Right;  . LEFT AND RIGHT HEART CATHETERIZATION WITH CORONARY ANGIOGRAM  12/29/2012   Procedure: LEFT AND RIGHT HEART CATHETERIZATION WITH CORONARY ANGIOGRAM;  Surgeon: Sherren Mocha, MD;  Location: [facility] CATH LAB;  Service: Cardiovascular;;  . POLYPECTOMY  08/01/2019   Procedure: POLYPECTOMY;  Surgeon: Mauri Pole, MD;  [facility];  Service: Endoscopy;;  . TRANSTHORACIC ECHOCARDIOGRAM  12/29/2012   EF 25% with periapical HK/AKA.  --> Followup echo 7/25 -- EF of 35%  . TRANSTHORACIC ECHOCARDIOGRAM  01/2013 ; 07/2015   a) EF 55-60%. No regional WMA. Grade 1 diastolic dysfunction. Mild LA dilatation.; b) Normal LV size with mild LV hypertrophy. EF 55-60%. Normal RV    Prior to Admission medications   Medication Sig Start Date End Date Taking? Authorizing Provider  aspirin EC 81 MG tablet Take 1 tablet (81 mg total) by mouth daily. 05/13/17  Yes Almyra Deforest, PA  Multiple Vitamin (MULTIVITAMIN WITH MINERALS) TABS tablet Take 1 tablet by mouth daily.   Yes [provider]  vitamin E 180 MG (400 UNITS) capsule Take 800 Units by mouth daily.   Yes [provider]  Na Sulfate-K Sulfate-Mg Sulf 17.5-3.13-1.6 GM/177ML SOLN Take as split dose as directed 07/19/20   Mauri Pole, MD  triamcinolone (KENALOG) 0.1 % Apply 1 application topically 2 (two) times daily. Patient not taking: Reported on 07/26/2020 05/20/20   Maximiano Coss, NP    No current facility-administered medications for this encounter.    Allergies as of 07/19/2020 - Review Complete 05/20/2020  Allergen Reaction  Noted  . Oxycodone  10/02/2015    Family History  Problem Relation Age of Onset  . Cancer Mother        Liver cancer  . Cancer Father        Leukemia  . Cancer Maternal Grandmother        Liver cancer  . Cancer Paternal Grandmother        Colon cancer  . Colon cancer Neg Hx     Social History   Socioeconomic History  . Marital status: Married    Spouse name: Not on file  . Number of children: Not on file  . Years of education: Not on file  . Highest education level: Not on file  Occupational History  . Not on file  Tobacco Use  . Smoking status: Former Smoker    Quit date: 06/20/1986    Years since quitting: 34.1  . Smokeless tobacco: Never Used  Substance and Sexual Activity  . Alcohol use: Yes    Comment: History of heavy use but only social at present.  10/29/15 - "a beer every week or so"  . Drug use: No    Comment: Prior cocaine use but none since 2000  . Sexual activity: Never  Other Topics Concern  . Not on file  Social History Narrative   He is widowed for 13 years, and has 2 daughters: [name] & [address] [name], and one son Nefi Musich. the other history is daughter who is here with him today.    He works for [employer] as a [employer].   He quit smoking in 1988. He does drink alcohol socially, but he does have a history of heavy use in the past. He also has a prior history of cocaine use but none since 2000.   Social Determinants of Health   Financial Resource Strain: Not on file  Food Insecurity: Not on file  Transportation Needs: Not on file  Physical Activity: Not on file  Stress: Not on file  Social Connections: Not on file  Intimate Partner Violence: Not on file    Review of Systems:  All other review of systems negative except as mentioned in the HPI.  Physical Exam: Vital signs in last 24 hours:     General:   Alert, NAD Lungs:  Clear .   Heart:  Regular rate and rhythm Abdomen:  Soft,  nontender and nondistended. Neuro/Psych:  Alert and cooperative. Normal mood and affect. A and O x 3   [name] , MD [phone]

## 2020-08-05 NOTE — Anesthesia Preprocedure Evaluation (Addendum)
Anesthesia Evaluation  Patient identified by MRN, date of birth, ID band Patient awake  General Assessment Comment:History of post-op narcotic induced AHRF with PEA cardiac arrest following accidental narcotic overdose, prior to diagnosis of OSA   Reviewed: Allergy & Precautions, NPO status , Patient's Chart, lab work & pertinent test results  Airway Mallampati: III  TM Distance: >3 FB Neck ROM: Full    Dental  (+) Teeth Intact, Dental Advisory Given   Pulmonary sleep apnea (severe) and Continuous Positive Airway Pressure Ventilation , former smoker,    breath sounds clear to auscultation       Cardiovascular hypertension, Pt. 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   Neuro/Psych  Headaches, negative psych ROS   GI/Hepatic Neg liver ROS,   Endo/Other    Renal/GU Renal disease  negative genitourinary   Musculoskeletal negative musculoskeletal ROS (+)   Abdominal (+) + obese,   Peds  Hematology  (+) Blood dyscrasia, anemia ,   Anesthesia Other Findings   Reproductive/Obstetrics negative OB ROS                           Anesthesia Physical  Anesthesia Plan  ASA: III  Anesthesia Plan: MAC   Post-op Pain Management:    Induction: Intravenous  PONV Risk Score and Plan: Propofol infusion and TIVA  Airway Management Planned: Simple Face Mask  Additional Equipment: None  Intra-op Plan:   Post-operative Plan:   Informed Consent: I have reviewed the patients History and Physical, chart, labs and discussed the procedure including the risks, benefits and alternatives for the proposed anesthesia with the patient or authorized representative who has indicated his/her understanding and acceptance.     Plan/risks discussed with: Discussed with patient and daughter.  Plan Discussed with: CRNA and Anesthesiologist  Anesthesia Plan  Comments:       Anesthesia Quick Evaluation

## 2020-08-05 NOTE — Anesthesia Procedure Notes (Signed)
Procedure Name: MAC Date/Time: 08/05/2020 11:52 AM Performed by: Lissa Morales, CRNA Pre-anesthesia Checklist: Patient identified, Emergency Drugs available, Suction available, Patient being monitored and Timeout performed Patient Re-evaluated:Patient Re-evaluated prior to induction Oxygen Delivery Method: Simple face mask Placement Confirmation: positive ETCO2

## 2020-08-05 NOTE — Transfer of Care (Signed)
Immediate Anesthesia Transfer of Care Note  Patient: Nicholas Potts  Procedure(s) Performed: COLONOSCOPY WITH PROPOFOL (N/A ) BIOPSY POLYPECTOMY  Patient Location: PACU  Anesthesia Type:MAC  Level of Consciousness: awake, alert , oriented and patient cooperative  Airway & Oxygen Therapy: Patient Spontanous Breathing and Patient connected to face mask oxygen  Post-op Assessment: Report given to RN, Post -op Vital signs reviewed and stable and Patient moving all extremities X 4  Post vital signs: stable  Last Vitals:  Vitals Value Taken Time  BP    Temp    Pulse 84 08/05/20 1234  Resp 10 08/05/20 1234  SpO2 97 % 08/05/20 1234  Vitals shown include unvalidated device data.  Last Pain:  Vitals:   08/05/20 1134  TempSrc: Oral  PainSc: 0-No pain         Complications: No complications documented.

## 2020-08-06 ENCOUNTER — Encounter: Payer: Self-pay | Admitting: Gastroenterology

## 2020-08-06 LAB — SURGICAL PATHOLOGY

## 2020-08-06 NOTE — Anesthesia Postprocedure Evaluation (Signed)
Anesthesia Post Note  Patient: Nicholas Potts  Procedure(s) Performed: COLONOSCOPY WITH PROPOFOL (N/A ) BIOPSY POLYPECTOMY     Patient location during evaluation: Endoscopy Anesthesia Type: MAC Level of consciousness: awake and alert Pain management: pain level controlled Vital Signs Assessment: post-procedure vital signs reviewed and stable Respiratory status: spontaneous breathing, nonlabored ventilation and respiratory function stable Cardiovascular status: blood pressure returned to baseline and stable Postop Assessment: no apparent nausea or vomiting Anesthetic complications: no   No complications documented.  Last Vitals:  Vitals:   08/05/20 1235 08/05/20 1250  BP: 116/60 (!) 102/54  Pulse: 84 78  Resp: 10 12  Temp: 36.8 C   SpO2: 97% 96%    Last Pain:  Vitals:   08/05/20 1250  TempSrc:   PainSc: 0-No pain                 Merlinda Frederick

## 2020-08-29 ENCOUNTER — Ambulatory Visit: Payer: Self-pay | Admitting: Family Medicine

## 2020-08-29 NOTE — Progress Notes (Deleted)
PATIENT: Nicholas Potts DOB: 07-19-1960  REASON FOR VISIT: follow up HISTORY FROM: patient  No chief complaint on file.    HISTORY OF PRESENT ILLNESS: 08/29/20 ALL:  Nicholas Potts is a 60 y.o. male here today for follow up for OSA on CPAP.       HISTORY: (copied from Dr Teofilo Pod previous note)  Nicholas Potts is a 60 year old right-handed gentleman with an underlying medical history of cardiomyopathy, hypertension, hyperlipidemia, stroke, accidental opioid overdose in 2014 with acute respiratory failure, thrombocytopenia and morbid obesity with a BMI of over 40, who presents for FU consultation of his OSA, after sleep study evaluation and starting autoPAP with new equipment. The patient is accompanied by his daughter today; she helps with interpretation and they signed a waiver for language interpretation services. I first met him on 08/09/2019 at the request of his primary care physician, at which time the patient reported a prior diagnosis of OSA since 2014.  He had been on AutoPap therapy and qualified for new equipment.  I suggested we proceed with diagnostic testing in the form of a laboratory sleep study.  He had a baseline polysomnogram on 09/04/2019 which confirmed his diagnosis of sleep apnea in the severe range with an AHI of 67.8/h, O2 nadir of 78%.  I suggested we proceed with ongoing treatment with AutoPap with new equipment.  He presented for a follow-up appointment on 12/05/2019 but he had not been on his new equipment for more than 30 days at the time.  He was on his new AutoPap about a week at the time.  He was advised to reschedule his appointment and we did not do a full visit.  Today, 04/09/2020: I reviewed his AutoPap compliance data from 03/11/2020 through 04/09/2020, which is a total of 30 days, during which time he used his machine every night, of course tonight's sleep has not happened yet.  His compliance percentage for more than 4 hours usage is 93%, actually  higher since it also includes tonight.  Average usage is about 6 hours and 9 minutes, leak on the low side with a 95th percentile at 5.3 L/min, average pressure for the 95th percentile at 14.5 cm with a range of 7 cm to 16 cm with EPR.  Average AHI at goal at 0.6/h.  He reports that he sleeps an average of 6 hours on any given night.  He has noticed an increase in his fatigue level lately.  He has not had any new medications.  He has an upcoming physical with his primary care physician.  In February, his TSH was in the normal range, I reviewed blood work from February.  He has noticed that the machine sometimes cuts off randomly in the middle of the night and also turns back on after a few minutes.  He has not talked to his DME company yet.  He is compliant with treatment and feels better including better daytime energy and better sleep quality.  He is motivated to continue with treatment.  His weight has been stable.  He is trying to lose weight.  His daughter has noted noisy breathing.  The patient denies any shortness of breath.  He does have some allergy symptoms and sneezes in the mornings.  He has not tried any over-the-counter allergy medication.  The patient's allergies, current medications, family history, past medical history, past social history, past surgical history and problem list were reviewed and updated as appropriate.   Previously:   08/09/19: (He) was diagnosed  with severe obstructive sleep apnea in 2014. He has been on AutoPap therapy. I reviewed your office note from 07/13/2019. His Epworth sleepiness score is 8/24. He has seen Dr. Jannifer Franklin in our office for dizziness and memory loss in 2018.  He was diagnosed with severe obstructive sleep apnea in September 2014, AHI was 79.7 at the time, O2 nadir 53%, he had a subsequent CPAP titration study on 03/22/2013. He has been on AutoPap therapy, I was able to review a download from 07/10/2019 through the 08/08/2019, during which time he used his  machine every night with percent use days greater than 4 hours at 97%, indicating excellent compliance with an average usage of 6 hours and 9 minutes, residual AHI 1.2/h, average pressure for the 95th percentile at 15.8 cm with a range of 5 cm to 20 cm, leak on the high side with a 95th percentile at 23 L/min. He reports using a full facemask. He reports full compliance with his machine, has had some morning headaches about a month ago but in the past 2 weeks he has not had any morning headaches. Bedtime is generally around 10 PM and rise time around 5 AM. He works in a [employer] features editor. He quit smoking some 32 years ago, drinks alcohol infrequently, caffeine in the form of coffee, 1 cup/day and 1 soda per day on average. He is widowed, wife died about 24 years ago in a car accident and he lives with his son and younger daughter, he has an older daughter as well. They have 1 cat in the household, not in his bedroom at night. He does have a TV in the bedroom but turns it off before falling asleep. He has gained weight over time. He is currently working on weight loss.His son reports that patient had a severe reaction to oxycodone some years ago and had gained a lot of water weight at the time.    REVIEW OF SYSTEMS: Out of a complete 14 system review of symptoms, the patient complains only of the following symptoms, and all other reviewed systems are negative.   ALLERGIES: Allergies  Allergen Reactions  . Oxycodone     Oxygen levels drop    HOME MEDICATIONS: Outpatient Medications Prior to Visit  Medication Sig Dispense Refill  . aspirin EC 81 MG tablet Take 1 tablet (81 mg total) by mouth daily. 90 tablet 3  . Multiple Vitamin (MULTIVITAMIN WITH MINERALS) TABS tablet Take 1 tablet by mouth daily.    Marland Kitchen triamcinolone (KENALOG) 0.1 % Apply 1 application topically 2 (two) times daily. (Patient not taking: Reported on 07/26/2020) 30 g 0  . vitamin E 180 MG (400 UNITS) capsule Take 800 Units by  mouth daily.     No facility-administered medications prior to visit.    PAST MEDICAL HISTORY: Past Medical History:  Diagnosis Date  . 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[place]) 2014   post op   . ARF (acute respiratory failure) ([place]) 2014  . Blood dyscrasia   . Chronic kidney disease 2014   ARF   . Complication of anesthesia 2014   - post op - in combination of pain pills-  Cardiac and Respiratory Arrest- prior to sleep apnea diagonosis  . Constipation   . CVA (cerebral infarction)   . Hyperlipidemia   . Hypertension   . OSA (obstructive sleep apnea)   . Pre-diabetes   . Short-term memory loss   . Sleep apnea   . Stress-induced cardiomyopathy -- essentially resolved    Post-op  Narcotic induced Acute Hypoxic respiratory failure with respiratory and PEA cardiac arrest following accidental narcotic overdose; initial [place] 20-20%, now improved to 55%   Was supported with Impella Cardiac Cath - non-obstructive CAD   . Thrombocytopenia ([place]) 2014    PAST SURGICAL HISTORY: Past Surgical History:  Procedure Laterality Date  . ACHILLES TENDON REPAIR    . BIOPSY  08/01/2019   Procedure: BIOPSY;  Surgeon: Mauri Pole, MD;  Location: [facility];  Service: Endoscopy;;  . BIOPSY  08/05/2020   Procedure: BIOPSY;  Surgeon: Mauri Pole, MD;  Location: [facility];  Service: Endoscopy;;  . CARDIAC CATHETERIZATION  12/29/2012   RN LHC --> Impella. for severe shock with low EF. Mild to moderate RV pressure elevation. Normal coronary arteries. Cardiac Output 2.5, Index 1.46.  Marland Kitchen COLONOSCOPY WITH PROPOFOL N/A 10/30/2015   Procedure: COLONOSCOPY WITH PROPOFOL;  Surgeon: Mauri Pole, MD;  Location: [facility];  Service: Endoscopy;  Laterality: N/A;  . COLONOSCOPY WITH PROPOFOL N/A 08/01/2019   Procedure: COLONOSCOPY WITH PROPOFOL;  Surgeon: Mauri Pole, MD;  Location: [facility];  Service: Endoscopy;  Laterality: N/A;  . COLONOSCOPY WITH PROPOFOL N/A 08/05/2020   Procedure:  COLONOSCOPY WITH PROPOFOL;  Surgeon: Mauri Pole, MD;  Location: [facility];  Service: Endoscopy;  Laterality: N/A;  . INSERTION OF DIALYSIS CATHETER Right 01/09/2013   Procedure: INSERTION OF DIALYSIS CATHETER;  Surgeon: Angelia Mould, MD;  Location: [facility];  Service: Vascular;  Laterality: Right;  . LEFT AND RIGHT HEART CATHETERIZATION WITH CORONARY ANGIOGRAM  12/29/2012   Procedure: LEFT AND RIGHT HEART CATHETERIZATION WITH CORONARY ANGIOGRAM;  Surgeon: Sherren Mocha, MD;  [facility] CATH LAB;  Service: Cardiovascular;;  . POLYPECTOMY  08/01/2019   Procedure: POLYPECTOMY;  Surgeon: Mauri Pole, MD;  Location: [facility];  Service: Endoscopy;;  . POLYPECTOMY  08/05/2020   Procedure: POLYPECTOMY;  Surgeon: Mauri Pole, MD;  Location: [facility];  Service: Endoscopy;;  . TRANSTHORACIC ECHOCARDIOGRAM  12/29/2012   EF 25% with periapical HK/AKA.  --> Followup echo 7/25 -- EF of 35%  . TRANSTHORACIC ECHOCARDIOGRAM  01/2013 ; 07/2015   a) EF 55-60%. No regional WMA. Grade 1 diastolic dysfunction. Mild LA dilatation.; b) Normal LV size with mild LV hypertrophy. EF 55-60%. Normal RV    FAMILY HISTORY: Family History  Problem Relation Age of Onset  . Cancer Mother        Liver cancer  . Cancer Father        Leukemia  . Cancer Maternal Grandmother        Liver cancer  . Cancer Paternal Grandmother        Colon cancer  . Colon cancer Neg Hx     SOCIAL HISTORY: Social History   Socioeconomic History  . Marital status: Married    Spouse name: Not on file  . Number of children: Not on file  . Years of education: Not on file  . Highest education level: Not on file  Occupational History  . Not on file  Tobacco Use  . Smoking status: Former Smoker    Quit date: 06/20/1986    Years since quitting: 34.2  . Smokeless tobacco: Never Used  Substance and Sexual Activity  . Alcohol use: Yes    Comment: History of heavy use but only social at present.  10/29/15  - "a beer every week or so"  . Drug use: No    Comment: Prior cocaine use but none since 2000  .  Sexual activity: Never  Other Topics Concern  . Not on file  Social History Narrative   He is widowed for 13 years, and has 2 daughters: [name] & [address] [name], and one son Koda Routon. the other history is daughter who is here with him today.    He works for [employer] as a [employer].   He quit smoking in 1988. He does drink alcohol socially, but he does have a history of heavy use in the past. He also has a prior history of cocaine use but none since 2000.   Social Determinants of Health   Financial Resource Strain: Not on file  Food Insecurity: Not on file  Transportation Needs: Not on file  Physical Activity: Not on file  Stress: Not on file  Social Connections: Not on file  Intimate Partner Violence: Not on file     PHYSICAL EXAM  There were no vitals filed for this visit. There is no height or weight on file to calculate BMI.  Generalized: Well developed, in no acute distress  Cardiology: normal rate and rhythm, no murmur noted Respiratory: clear to auscultation bilaterally  Neurological examination  Mentation: Alert oriented to time, place, history taking. Follows all commands speech and language fluent Cranial nerve II-XII: Pupils were equal round reactive to light. Extraocular movements were full, visual field were full  Motor: The motor testing reveals 5 over 5 strength of all 4 extremities. Good symmetric motor tone is noted throughout.  Gait and station: Gait is normal.    DIAGNOSTIC DATA (LABS, IMAGING, TESTING) - I reviewed patient records, labs, notes, testing and imaging myself where available.  No flowsheet data found.   Lab Results  Component Value Date   WBC 9.2 05/20/2020   HGB 15.3 05/20/2020   HCT 44.8 05/20/2020   MCV 96 05/20/2020   PLT 279 10/21/2017      Component Value Date/Time   NA 140  05/20/2020 1607   K 4.4 05/20/2020 1607   CL 104 05/20/2020 1607   CO2 23 05/20/2020 1607   GLUCOSE 110 (H) 05/20/2020 1607   GLUCOSE 91 08/22/2016 0937   BUN 13 05/20/2020 1607   CREATININE 0.90 05/20/2020 1607   CREATININE 1.03 08/22/2016 0937   CALCIUM 9.4 05/20/2020 1607   PROT 7.0 05/20/2020 1607   ALBUMIN 4.4 05/20/2020 1607   AST 25 05/20/2020 1607   ALT 26 05/20/2020 1607   ALKPHOS 97 05/20/2020 1607   BILITOT 0.2 05/20/2020 1607   GFRNONAA 93 05/20/2020 1607   GFRNONAA 86 06/29/2015 1033   GFRAA 107 05/20/2020 1607   GFRAA >89 06/29/2015 1033   Lab Results  Component Value Date   CHOL 214 (H) 05/20/2020   HDL 36 (L) 05/20/2020   LDLCALC 136 (H) 05/20/2020   TRIG 233 (H) 05/20/2020   CHOLHDL 5.9 (H) 05/20/2020   Lab Results  Component Value Date   HGBA1C 5.8 (H) 05/20/2020   Lab Results  Component Value Date   [id number] 856 05/20/2020   Lab Results  Component Value Date   TSH 2.010 05/20/2020     ASSESSMENT AND PLAN 60 y.o. year old male  has a past medical history of 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[place]) (2014), ARF (acute respiratory failure) ([place]) (2014), Blood dyscrasia, Chronic kidney disease (3149), Complication of anesthesia (2014), Constipation, CVA (cerebral infarction), Hyperlipidemia, Hypertension, OSA (obstructive sleep apnea), Pre-diabetes, Short-term memory loss, Sleep apnea, Stress-induced cardiomyopathy -- essentially resolved, and Thrombocytopenia ([place]) (2014). here  with ***  No diagnosis found.   Kourtney Montesinos is doing well on CPAP therapy. Compliance report reveals ***. *** was encouraged to continue using CPAP nightly and for greater than 4 hours each night. We will update supply orders as indicated. Risks of untreated sleep apnea review and education materials provided. Healthy lifestyle habits encouraged. *** will follow up in ***, sooner if needed. *** verbalizes understanding and agreement with this plan.   No orders of the defined  types were placed in this encounter.    No orders of the defined types were placed in this encounter.     I spent 15 minutes with the patient. 50% of this time was spent counseling and educating patient on plan of care and medications.    Debbora Presto, FNP-C 08/29/2020, 12:48 PM [facility] [address] [place], [place] [address] [phone]

## 2020-08-29 NOTE — Patient Instructions (Incomplete)
Please continue using your CPAP regularly. While your insurance requires that you use CPAP at least 4 hours each night on 70% of the nights, I recommend, that you not skip any nights and use it throughout the night if you can. Getting used to CPAP and staying with the treatment long term does take time and patience and discipline. Untreated obstructive sleep apnea when it is moderate to severe can have an adverse impact on cardiovascular health and raise her risk for heart disease, arrhythmias, hypertension, congestive heart failure, stroke and diabetes. Untreated obstructive sleep apnea causes sleep disruption, nonrestorative sleep, and sleep deprivation. This can have an impact on your day to day functioning and cause daytime sleepiness and impairment of cognitive function, memory loss, mood disturbance, and problems focussing. Using CPAP regularly can improve these symptoms.    Apnea del sueo Sleep Apnea La apnea del sueo afecta la respiracin mientras se duerme. Hace que la respiracin se detenga por poco tiempo o se vuelva superficial. Tambin puede aumentar el riesgo de:  Infarto de miocardio.  Accidente cerebrovascular.  Tener mucho sobrepeso (obesidad).  Diabetes.  Insuficiencia cardaca.  Latidos cardacos irregulares. [place] del tratamiento es ayudarle a respirar normalmente otra vez. Cules son las causas? Existen tres tipos de apnea del sueo:  Apnea obstructiva del sueo. Esta ocurre cuando las vas respiratorias se obstruyen o colapsan.  Apnea central del sueo. Esta ocurre cuando el cerebro no enva las seales correctas a los msculos que controlan la respiracin.  Apnea mixta del sueo. Esta es una combinacin de apnea obstructiva y central del sueo. La causa ms frecuente de esta afeccin es la obstruccin o el colapso de las vas respiratorias. Esto puede suceder si:  Los msculos de la garganta estn demasiado relajados.  Tiene la lengua y las amgdalas  demasiado grandes.  Tiene sobrepeso.  Tiene las vas respiratorias demasiado pequeas.   Qu incrementa el riesgo?  Tener sobrepeso.  Fumar.  Tener vas respiratorias pequeas.  El envejecimiento.  Ser hombre.  El consumo de alcohol.  Tomar medicamentos para calmarse (sedantes o tranquilizantes).  Tener familiares con esta afeccin. Cules son los signos o los sntomas?  Dificultad para permanecer dormido.  Estar somnoliento o [place].  Enojarse mucho.  Ronquidos fuertes.  Dolor de cabeza por la maana.  Imposibilidad de enfocar la mente (concentrarse).  Olvidar cosas.  Menos inters por el sexo.  Cambios en el estado de nimo.  Cambios en la personalidad.  Sentimientos de tristeza (depresin).  Levantarse mucho durante la noche para ir a Garment/textile technologist.  Sequedad en la boca.  Dolor de Investment banker, operational. Cmo se diagnostica?  Sus antecedentes mdicos.  Un examen fsico.  Ardelia Mems prueba que se realiza mientras la persona duerme (estudio del sueo). La prueba se realiza con mayor frecuencia en un laboratorio del sueo, pero tambin puede Press photographer. Cmo se trata?  Dormir de Associate Professor.  Usar un medicamento para eliminar la mucosidad de la nariz (descongestivo).  Evitar el consumo de alcohol, medicamentos que ayudan a relajarse o ciertos analgsicos (narcticos).  Bajar de [place], si es necesario.  Cambios en la dieta.  No fumar.  Usar una mquina para abrir las vas respiratorias mientras duerme; por ejemplo: ? Un aparato bucal. Se trata de una boquilla que desplaza la mandbula hacia adelante. ? Un dispositivo CPAP. Este dispositivo sopla aire a travs de una mscara cuando usted exhala. ? Un dispositivo EPAP. Este tiene vlvulas que se colocan en cada fosa nasal. ? Un  dispositivo BPAP. Este dispositivo sopla aire a travs de una mscara cuando usted inhala y exhala.  Someterse a Psychiatric nurse tratamientos no Insurance risk surveyor. Realizar un tratamiento para la apnea del sueo es importante. Sin tratamiento, esta afeccin puede derivar en lo siguiente:  Presin arterial alta.  Arteriopata coronaria.  En los hombres, no poder tener una ereccin (impotencia).  Reduccin de la capacidad de pensar.   Siga estas instrucciones en su casa: Estilo de [employer] cambios que le haya recomendado el mdico.  Siga una dieta saludable.  Baje de peso, si es necesario.  Evite el alcohol, los medicamentos para relajarse y Psychologist, forensic.  No consuma ningn producto que contenga nicotina o tabaco, como cigarrillos, cigarrillos electrnicos y tabaco de Higher education careers adviser. Si necesita ayuda para dejar de fumar, consulte al [employer]. Instrucciones generales  [employer] de venta libre y los recetados solamente como se lo haya indicado el mdico.  Si le proporcionaron una mquina para usar mientras duerme, sela solamente como se lo haya indicado el mdico.  Si va a someterse a [place], no olvide informarle al mdico que tiene apnea del sueo. Puede ser necesario que lleve su dispositivo consigo.  Concurra a todas las visitas de seguimiento como se lo haya indicado el mdico. Esto es importante. Comunquese con un mdico si:  El Charity fundraiser para usar mientras duerme le [place] o parece no funcionar.  No se siente mejor.  Empeora. Solicite ayuda inmediatamente si:  Le duele el pecho.  Tiene dificultad para inhalar suficiente aire.  Tiene molestias en la espalda, en los brazos o en el [place].  Tiene dificultad para hablar.  Siente debilidad en un lado del cuerpo.  Se le cae un lado de la cara. Estos sntomas pueden Sales executive. No espere a ver si los sntomas desaparecen. Solicite atencin mdica de inmediato. Comunquese con el servicio de emergencias de su localidad (911 en los [place]). No conduzca por sus propios medios [employer]  hospital. Resumen  Esta afeccin afecta la respiracin durante el sueo.  La causa ms frecuente es la obstruccin o el colapso de las vas respiratorias.  El [place] del tratamiento es ayudarlo a respirar normalmente mientras duerme. Esta informacin no tiene Marine scientist el consejo del mdico. Asegrese de hacerle al mdico cualquier pregunta que tenga. Document Revised: 02/16/2018 Document Reviewed: 02/16/2018 Elsevier Patient Education  2021 [employer].

## 2020-10-15 NOTE — Patient Instructions (Addendum)
Please continue using your CPAP regularly. While your insurance requires that you use CPAP at least 4 hours each night on 70% of the nights, I recommend, that you not skip any nights and use it throughout the night if you can. Getting used to CPAP and staying with the treatment long term does take time and patience and discipline. Untreated obstructive sleep apnea when it is moderate to severe can have an adverse impact on cardiovascular health and raise her risk for heart disease, arrhythmias, hypertension, congestive heart failure, stroke and diabetes. Untreated obstructive sleep apnea causes sleep disruption, nonrestorative sleep, and sleep deprivation. This can have an impact on your day to day functioning and cause daytime sleepiness and impairment of cognitive function, memory loss, mood disturbance, and problems focussing. Using CPAP regularly can improve these symptoms.   We will ask [facility] to look at your machine and make sure everything is operating correctly. I will also update supplies. We will order a overnight oxygen study. Complete this when machine is working correctly.   DME: [facility] P: [phone] F: [phone]   Sleep Apnea Sleep apnea affects breathing during sleep. It causes breathing to stop for a short time or to become shallow. It can also increase the risk of:  Heart attack.  Stroke.  Being very overweight (obese).  Diabetes.  Heart failure.  Irregular heartbeat. The goal of treatment is to help you breathe normally again. What are the causes? There are three kinds of sleep apnea:  Obstructive sleep apnea. This is caused by a blocked or collapsed airway.  Central sleep apnea. This happens when the brain does not send the right signals to the muscles that control breathing.  Mixed sleep apnea. This is a combination of obstructive and central sleep apnea. The most common cause of this condition is a collapsed or blocked airway. This can happen  if:  Your throat muscles are too relaxed.  Your tongue and tonsils are too large.  You are overweight.  Your airway is too small.   What increases the risk?  Being overweight.  Smoking.  Having a small airway.  Being older.  Being male.  Drinking alcohol.  Taking medicines to calm yourself (sedatives or tranquilizers).  Having family members with the condition. What are the signs or symptoms?  Trouble staying asleep.  Being sleepy or tired during the day.  Getting angry a lot.  Loud snoring.  Headaches in the morning.  Not being able to focus your mind (concentrate).  Forgetting things.  Less interest in sex.  Mood swings.  Personality changes.  Feelings of sadness (depression).  Waking up a lot during the night to pee (urinate).  Dry mouth.  Sore throat. How is this diagnosed?  Your medical history.  A physical exam.  A test that is done when you are sleeping (sleep study). The test is most often done in a sleep lab but may also be done at home. How is this treated?  Sleeping on your side.  Using a medicine to get rid of mucus in your nose (decongestant).  Avoiding the use of alcohol, medicines to help you relax, or certain pain medicines (narcotics).  Losing weight, if needed.  Changing your diet.  Not smoking.  Using a machine to open your airway while you sleep, such as: ? An oral appliance. This is a mouthpiece that shifts your lower jaw forward. ? A CPAP device. This device blows air through a mask when you breathe out (exhale). ? An  EPAP device. This has valves that you put in each nostril. ? A BPAP device. This device blows air through a mask when you breathe in (inhale) and breathe out.  Having surgery if other treatments do not work. It is important to get treatment for sleep apnea. Without treatment, it can lead to:  High blood pressure.  Coronary artery disease.  In men, not being able to have an erection  (impotence).  Reduced thinking ability.   Follow these instructions at home: Lifestyle  Make changes that your doctor recommends.  Eat a healthy diet.  Lose weight if needed.  Avoid alcohol, medicines to help you relax, and some pain medicines.  Do not use any products that contain nicotine or tobacco, such as cigarettes, e-cigarettes, and chewing tobacco. If you need help quitting, ask your doctor. General instructions  Take over-the-counter and prescription medicines only as told by your doctor.  If you were given a machine to use while you sleep, use it only as told by your doctor.  If you are having surgery, make sure to tell your doctor you have sleep apnea. You may need to bring your device with you.  Keep all follow-up visits as told by your doctor. This is important. Contact a doctor if:  The machine that you were given to use during sleep bothers you or does not seem to be working.  You do not get better.  You get worse. Get help right away if:  Your chest hurts.  You have trouble breathing in enough air.  You have an uncomfortable feeling in your back, arms, or stomach.  You have trouble talking.  One side of your body feels weak.  A part of your face is hanging down. These symptoms may be an emergency. Do not wait to see if the symptoms will go away. Get medical help right away. Call your local emergency services (911 in the [place] Do not drive yourself to the hospital. Summary  This condition affects breathing during sleep.  The most common cause is a collapsed or blocked airway.  The goal of treatment is to help you breathe normally while you sleep. This information is not intended to replace advice given to you by your health care provider. Make sure you discuss any questions you have with your health care provider. Document Revised: 03/11/2018 Document Reviewed: 01/18/2018 Elsevier Patient Education  [place].

## 2020-10-15 NOTE — Progress Notes (Addendum)
PATIENT: Nicholas Potts DOB: 1961-04-19  REASON FOR VISIT: follow up HISTORY FROM: patient  Chief Complaint  Patient presents with  . Obstructive Sleep Apnea    RM 2 with daughter and interpreter Nicholas Potts) Pt is well and doing good, having trouble with CPAP turning off when he moves for about the last month, but it does help him when it is working.      HISTORY OF PRESENT ILLNESS: 10/17/20 ALL:  Nicholas Potts is a 60 y.o. male here today for follow up for OSA on CPAP. He is doing well with CPAP. He does feel that he is sleeping better. He is using CPAP every night. Over the past month he reports that his machine "turns off" when he turns in his sleep. He reports that the machine completely turns off for several minutes. He has to hit the power button to turn back on. Sometimes it will not blow air automatically. He has to turn machine on and off several times to get it to start. Otherwise, he is doing well with no concerns.   ONO performed in 12/4 showed oxygen desaturations of around 81% for about 14 minutes on CPAP. Max pressure increased to 15cmH20. He does feel that he slept better with increased pressure.      HISTORY: (copied from Dr Guadelupe Sabin previous note)  Nicholas Potts is a 60 year old right-handed gentleman with an underlying medical history of cardiomyopathy, hypertension, hyperlipidemia, stroke, accidental opioid overdose in 2014 with acute respiratory failure, thrombocytopenia and morbid obesity with a BMI of over 40, who presents for FU consultation of his OSA, after sleep study evaluation and starting autoPAP with new equipment. The patient is accompanied by his daughter today; she helps with interpretation and they signed a waiver for language interpretation services. I first met him on 08/09/2019 at the request of his primary care physician, at which time the patient reported a prior diagnosis of OSA since 2014.  He had been on AutoPap therapy and qualified for new  equipment.  I suggested we proceed with diagnostic testing in the form of a laboratory sleep study.  He had a baseline polysomnogram on 09/04/2019 which confirmed his diagnosis of sleep apnea in the severe range with an AHI of 67.8/h, O2 nadir of 78%.  I suggested we proceed with ongoing treatment with AutoPap with new equipment.  He presented for a follow-up appointment on 12/05/2019 but he had not been on his new equipment for more than 30 days at the time.  He was on his new AutoPap about a week at the time.  He was advised to reschedule his appointment and we did not do a full visit.  Today, 04/09/2020: I reviewed his AutoPap compliance data from 03/11/2020 through 04/09/2020, which is a total of 30 days, during which time he used his machine every night, of course tonight's sleep has not happened yet.  His compliance percentage for more than 4 hours usage is 93%, actually higher since it also includes tonight.  Average usage is about 6 hours and 9 minutes, leak on the low side with a 95th percentile at 5.3 L/min, average pressure for the 95th percentile at 14.5 cm with a range of 7 cm to 16 cm with EPR.  Average AHI at goal at 0.6/h.  He reports that he sleeps an average of 6 hours on any given night.  He has noticed an increase in his fatigue level lately.  He has not had any new medications.  He has an  upcoming physical with his primary care physician.  In February, his TSH was in the normal range, I reviewed blood work from February.  He has noticed that the machine sometimes cuts off randomly in the middle of the night and also turns back on after a few minutes.  He has not talked to his DME company yet.  He is compliant with treatment and feels better including better daytime energy and better sleep quality.  He is motivated to continue with treatment.  His weight has been stable.  He is trying to lose weight.  His daughter has noted noisy breathing.  The patient denies any shortness of breath.  He does  have some allergy symptoms and sneezes in the mornings.  He has not tried any over-the-counter allergy medication.  The patient's allergies, current medications, family history, past medical history, past social history, past surgical history and problem list were reviewed and updated as appropriate.   Previously:   08/09/19: (He) was diagnosed with severe obstructive sleep apnea in 2014. He has been on AutoPap therapy. I reviewed your office note from 07/13/2019. His Epworth sleepiness score is 8/24. He has seen Dr. Jannifer Franklin in our office for dizziness and memory loss in 2018.  He was diagnosed with severe obstructive sleep apnea in September 2014, AHI was 79.7 at the time, O2 nadir 53%, he had a subsequent CPAP titration study on 03/22/2013. He has been on AutoPap therapy, I was able to review a download from 07/10/2019 through the 08/08/2019, during which time he used his machine every night with percent use days greater than 4 hours at 97%, indicating excellent compliance with an average usage of 6 hours and 9 minutes, residual AHI 1.2/h, average pressure for the 95th percentile at 15.8 cm with a range of 5 cm to 20 cm, leak on the high side with a 95th percentile at 23 L/min. He reports using a full facemask. He reports full compliance with his machine, has had some morning headaches about a month ago but in the past 2 weeks he has not had any morning headaches. Bedtime is generally around 10 PM and rise time around 5 AM. He works in a [employer] features editor. He quit smoking some 32 years ago, drinks alcohol infrequently, caffeine in the form of coffee, 1 cup/day and 1 soda per day on average. He is widowed, wife died about 28 years ago in a car accident and he lives with his son and younger daughter, he has an older daughter as well. They have 1 cat in the household, not in his bedroom at night. He does have a TV in the bedroom but turns it off before falling asleep. He has gained weight over time. He is  currently working on weight loss.His son reports that patient had a severe reaction to oxycodone some years ago and had gained a lot of water weight at the time.   REVIEW OF SYSTEMS: Out of a complete 14 system review of symptoms, the patient complains only of the following symptoms, and all other reviewed systems are negative.  ESS:9   ALLERGIES: Allergies  Allergen Reactions  . Oxycodone     Oxygen levels drop    HOME MEDICATIONS: Outpatient Medications Prior to Visit  Medication Sig Dispense Refill  . aspirin EC 81 MG tablet Take 1 tablet (81 mg total) by mouth daily. 90 tablet 3  . Multiple Vitamin (MULTIVITAMIN WITH MINERALS) TABS tablet Take 1 tablet by mouth daily.    Marland Kitchen triamcinolone (KENALOG)  0.1 % Apply 1 application topically 2 (two) times daily. (Patient not taking: Reported on 10/17/2020) 30 g 0  . vitamin E 180 MG (400 UNITS) capsule Take 800 Units by mouth daily.     No facility-administered medications prior to visit.    PAST MEDICAL HISTORY: Past Medical History:  Diagnosis Date  . 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[place]) 2014   post op   . ARF (acute respiratory failure) ([place]) 2014  . Blood dyscrasia   . Chronic kidney disease 2014   ARF   . Complication of anesthesia 2014   - post op - in combination of pain pills-  Cardiac and Respiratory Arrest- prior to sleep apnea diagonosis  . Constipation   . CVA (cerebral infarction)   . Hyperlipidemia   . Hypertension   . OSA (obstructive sleep apnea)   . Pre-diabetes   . Short-term memory loss   . Sleep apnea   . Stress-induced cardiomyopathy -- essentially resolved    Post-op Narcotic induced Acute Hypoxic respiratory failure with respiratory and PEA cardiac arrest following accidental narcotic overdose; initial [place] 20-20%, now improved to 55%   Was supported with Impella Cardiac Cath - non-obstructive CAD   . Thrombocytopenia ([place]) 2014    PAST SURGICAL HISTORY: Past Surgical History:  Procedure Laterality Date   . ACHILLES TENDON REPAIR    . BIOPSY  08/01/2019   Procedure: BIOPSY;  Surgeon: Mauri Pole, MD;  Location: [facility];  Service: Endoscopy;;  . BIOPSY  08/05/2020   Procedure: BIOPSY;  Surgeon: Mauri Pole, MD;  Location: [facility];  Service: Endoscopy;;  . CARDIAC CATHETERIZATION  12/29/2012   RN LHC --> Impella. for severe shock with low EF. Mild to moderate RV pressure elevation. Normal coronary arteries. Cardiac Output 2.5, Index 1.46.  Marland Kitchen COLONOSCOPY WITH PROPOFOL N/A 10/30/2015   Procedure: COLONOSCOPY WITH PROPOFOL;  Surgeon: Mauri Pole, MD;  Location: [facility];  Service: Endoscopy;  Laterality: N/A;  . COLONOSCOPY WITH PROPOFOL N/A 08/01/2019   Procedure: COLONOSCOPY WITH PROPOFOL;  Surgeon: Mauri Pole, MD;  Location: [facility];  Service: Endoscopy;  Laterality: N/A;  . COLONOSCOPY WITH PROPOFOL N/A 08/05/2020   Procedure: COLONOSCOPY WITH PROPOFOL;  Surgeon: Mauri Pole, MD;  Location: [facility];  Service: Endoscopy;  Laterality: N/A;  . INSERTION OF DIALYSIS CATHETER Right 01/09/2013   Procedure: INSERTION OF DIALYSIS CATHETER;  Surgeon: Angelia Mould, MD;  Location: [place];  Service: Vascular;  Laterality: Right;  . LEFT AND RIGHT HEART CATHETERIZATION WITH CORONARY ANGIOGRAM  12/29/2012   Procedure: LEFT AND RIGHT HEART CATHETERIZATION WITH CORONARY ANGIOGRAM;  Surgeon: Sherren Mocha, MD;  Location: [facility] CATH LAB;  Service: Cardiovascular;;  . POLYPECTOMY  08/01/2019   Procedure: POLYPECTOMY;  Surgeon: Mauri Pole, MD;  Location: [facility];  Service: Endoscopy;;  . POLYPECTOMY  08/05/2020   Procedure: POLYPECTOMY;  Surgeon: Mauri Pole, MD;  Location: [facility];  Service: Endoscopy;;  . TRANSTHORACIC ECHOCARDIOGRAM  12/29/2012   EF 25% with periapical HK/AKA.  --> Followup echo 7/25 -- EF of 35%  . TRANSTHORACIC ECHOCARDIOGRAM  01/2013 ; 07/2015   a) EF 55-60%. No regional WMA. Grade 1 diastolic dysfunction.  Mild LA dilatation.; b) Normal LV size with mild LV hypertrophy. EF 55-60%. Normal RV    FAMILY HISTORY: Family History  Problem Relation Age of Onset  . Cancer Mother        Liver cancer  . Cancer Father        Leukemia  .  Cancer Maternal Grandmother        Liver cancer  . Cancer Paternal Grandmother        Colon cancer  . Colon cancer Neg Hx     SOCIAL HISTORY: Social History   Socioeconomic History  . Marital status: Married    Spouse name: Not on file  . Number of children: Not on file  . Years of education: Not on file  . Highest education level: Not on file  Occupational History  . Not on file  Tobacco Use  . Smoking status: Former Smoker    Quit date: 06/20/1986    Years since quitting: 34.3  . Smokeless tobacco: Never Used  Substance and Sexual Activity  . Alcohol use: Yes    Comment: History of heavy use but only social at present.  10/29/15 - "a beer every week or so"  . Drug use: No    Comment: Prior cocaine use but none since 2000  . Sexual activity: Never  Other Topics Concern  . Not on file  Social History Narrative   He is widowed for 13 years, and has 2 daughters: [name] & [address] [name], and one son Kail Fraley. the other history is daughter who is here with him today.    He works for [employer] as a [employer].   He quit smoking in 1988. He does drink alcohol socially, but he does have a history of heavy use in the past. He also has a prior history of cocaine use but none since 2000.   Social Determinants of Health   Financial Resource Strain: Not on file  Food Insecurity: Not on file  Transportation Needs: Not on file  Physical Activity: Not on file  Stress: Not on file  Social Connections: Not on file  Intimate Partner Violence: Not on file     PHYSICAL EXAM  Vitals:   10/17/20 0822  BP: 110/65  Pulse: 70  Weight: 262 lb (118.8 kg)  Height: $Remove'5\' 7"'iwupPkT$  (1.702 m)   Body mass index is  41.04 kg/m.  Generalized: Well developed, in no acute distress  Cardiology: normal rate and rhythm, no murmur noted Respiratory: clear to auscultation bilaterally  Neurological examination  Mentation: Alert oriented to time, place, history taking. Follows all commands speech and language fluent Cranial nerve II-XII: Pupils were equal round reactive to light. Extraocular movements were full, visual field were full  Motor: The motor testing reveals 5 over 5 strength of all 4 extremities. Good symmetric motor tone is noted throughout.  Gait and station: Gait is normal.    DIAGNOSTIC DATA (LABS, IMAGING, TESTING) - I reviewed patient records, labs, notes, testing and imaging myself where available.  No flowsheet data found.   Lab Results  Component Value Date   WBC 9.2 05/20/2020   HGB 15.3 05/20/2020   HCT 44.8 05/20/2020   MCV 96 05/20/2020   PLT 279 10/21/2017      Component Value Date/Time   NA 140 05/20/2020 1607   K 4.4 05/20/2020 1607   CL 104 05/20/2020 1607   CO2 23 05/20/2020 1607   GLUCOSE 110 (H) 05/20/2020 1607   GLUCOSE 91 08/22/2016 0937   BUN 13 05/20/2020 1607   CREATININE 0.90 05/20/2020 1607   CREATININE 1.03 08/22/2016 0937   CALCIUM 9.4 05/20/2020 1607   PROT 7.0 05/20/2020 1607   ALBUMIN 4.4 05/20/2020 1607   AST 25 05/20/2020 1607   ALT 26 05/20/2020 1607  ALKPHOS 97 05/20/2020 1607   BILITOT 0.2 05/20/2020 1607   GFRNONAA 93 05/20/2020 1607   GFRNONAA 86 06/29/2015 1033   GFRAA 107 05/20/2020 1607   GFRAA >89 06/29/2015 1033   Lab Results  Component Value Date   CHOL 214 (H) 05/20/2020   HDL 36 (L) 05/20/2020   LDLCALC 136 (H) 05/20/2020   TRIG 233 (H) 05/20/2020   CHOLHDL 5.9 (H) 05/20/2020   Lab Results  Component Value Date   HGBA1C 5.8 (H) 05/20/2020   Lab Results  Component Value Date   [id number] 401 05/20/2020   Lab Results  Component Value Date   TSH 2.010 05/20/2020     ASSESSMENT AND PLAN 60 y.o. year old male  has  a past medical history of 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[place]) (2014), ARF (acute respiratory failure) ([place]) (2014), Blood dyscrasia, Chronic kidney disease (0272), Complication of anesthesia (2014), Constipation, CVA (cerebral infarction), Hyperlipidemia, Hypertension, OSA (obstructive sleep apnea), Pre-diabetes, Short-term memory loss, Sleep apnea, Stress-induced cardiomyopathy -- essentially resolved, and Thrombocytopenia ([place]) (2014). here with     ICD-10-CM   1. OSA on CPAP  G47.33 For home use only DME continuous positive airway pressure (CPAP)   Z99.89 For home use only DME continuous positive airway pressure (CPAP)  2. Oxygen desaturation during sleep  G47.34 Pulse oximetry, overnight      Taye Cato is doing well on CPAP therapy. Compliance report reveals excellent compliance. he was encouraged to continue using CPAP nightly and for greater than 4 hours each night. I will ask [employer] to troubleshoot concerns of machine powering off spontaneously. He was given contact information for [employer] and advised to call today. We will update supply orders as indicated. I will also order repeat ONO to ensure resolution of desaturations with AutoPAP. Risks of untreated sleep apnea review and education materials provided. Healthy lifestyle habits encouraged. He will follow up in 6 months, sooner if needed. He and his daughter verbalizes understanding and agreement with this plan.    Orders Placed This Encounter  Procedures  . For home use only DME continuous positive airway pressure (CPAP)    Device check please, patient reports machine turns off spontaneously. Has to power up multiple times at night.    Order Specific Question:   Length of Need    Answer:   Lifetime    Order Specific Question:   Patient has OSA or probable OSA    Answer:   Yes    Order Specific Question:   Is the patient currently using CPAP in the home    Answer:   Yes    Order Specific Question:   Settings    Answer:    Other see comments    Order Specific Question:   CPAP supplies needed    Answer:   Mask, headgear, cushions, filters, heated tubing and water chamber  . For home use only DME continuous positive airway pressure (CPAP)    Supplies    Order Specific Question:   Length of Need    Answer:   Lifetime    Order Specific Question:   Patient has OSA or probable OSA    Answer:   Yes    Order Specific Question:   Is the patient currently using CPAP in the home    Answer:   Yes    Order Specific Question:   Settings    Answer:   Other see comments    Order Specific Question:   CPAP supplies needed  Answer:   Mask, headgear, cushions, filters, heated tubing and water chamber  . Pulse oximetry, overnight    Standing Status:   Future    Standing Expiration Date:   10/17/2021     No orders of the defined types were placed in this encounter.       Debbora Presto, FNP-C 10/17/2020, 8:54 AM [facility] [address] [place], [place] [address] [phone]  I reviewed the above note and documentation by the Nurse Practitioner and agree with the history, exam, assessment and plan as outlined above. I was available for consultation. Star Age, MD, PhD [facility])

## 2020-10-17 ENCOUNTER — Ambulatory Visit: Payer: BC Managed Care – PPO | Admitting: Family Medicine

## 2020-10-17 ENCOUNTER — Encounter: Payer: Self-pay | Admitting: Family Medicine

## 2020-10-17 VITALS — BP 110/65 | HR 70 | Ht 67.0 in | Wt 262.0 lb

## 2020-10-17 DIAGNOSIS — G4734 Idiopathic sleep related nonobstructive alveolar hypoventilation: Secondary | ICD-10-CM

## 2020-10-17 DIAGNOSIS — G4733 Obstructive sleep apnea (adult) (pediatric): Secondary | ICD-10-CM | POA: Diagnosis not present

## 2020-10-17 DIAGNOSIS — Z9989 Dependence on other enabling machines and devices: Secondary | ICD-10-CM | POA: Diagnosis not present

## 2021-04-10 ENCOUNTER — Ambulatory Visit: Payer: BC Managed Care – PPO | Admitting: Adult Health

## 2021-04-15 ENCOUNTER — Ambulatory Visit: Payer: BC Managed Care – PPO | Admitting: Family Medicine

## 2021-04-17 ENCOUNTER — Ambulatory Visit: Payer: BC Managed Care – PPO | Admitting: Family Medicine

## 2021-04-24 ENCOUNTER — Other Ambulatory Visit: Payer: Self-pay

## 2021-04-24 ENCOUNTER — Ambulatory Visit (INDEPENDENT_AMBULATORY_CARE_PROVIDER_SITE_OTHER)
Admission: RE | Admit: 2021-04-24 | Discharge: 2021-04-24 | Disposition: A | Discharge status: Home / Self Care | Payer: BC Managed Care – PPO | Source: Ambulatory Visit | Attending: Family Medicine | Admitting: Family Medicine

## 2021-04-24 ENCOUNTER — Ambulatory Visit: Payer: BC Managed Care – PPO | Admitting: Family Medicine

## 2021-04-24 VITALS — BP 130/78 | HR 83 | Temp 98.2°F | Resp 16 | Ht 67.0 in | Wt 266.4 lb

## 2021-04-24 DIAGNOSIS — R739 Hyperglycemia, unspecified: Secondary | ICD-10-CM | POA: Diagnosis not present

## 2021-04-24 DIAGNOSIS — R42 Dizziness and giddiness: Secondary | ICD-10-CM | POA: Diagnosis not present

## 2021-04-24 DIAGNOSIS — E559 Vitamin D deficiency, unspecified: Secondary | ICD-10-CM | POA: Diagnosis not present

## 2021-04-24 DIAGNOSIS — R195 Other fecal abnormalities: Secondary | ICD-10-CM | POA: Diagnosis not present

## 2021-04-24 DIAGNOSIS — S300XXA Contusion of lower back and pelvis, initial encounter: Secondary | ICD-10-CM

## 2021-04-24 LAB — CBC WITH DIFFERENTIAL/PLATELET
Basophils Absolute: 0 10*3/uL (ref 0.0–0.1)
Basophils Relative: 0.5 % (ref 0.0–3.0)
Eosinophils Absolute: 0.1 10*3/uL (ref 0.0–0.7)
Eosinophils Relative: 1.2 % (ref 0.0–5.0)
HCT: 45.3 % (ref 39.0–52.0)
Hemoglobin: 15 g/dL (ref 13.0–17.0)
Lymphocytes Relative: 18.1 % (ref 12.0–46.0)
Lymphs Abs: 1.5 10*3/uL (ref 0.7–4.0)
MCHC: 33.1 g/dL (ref 30.0–36.0)
MCV: 98.3 fl (ref 78.0–100.0)
Monocytes Absolute: 0.6 10*3/uL (ref 0.1–1.0)
Monocytes Relative: 7.7 % (ref 3.0–12.0)
Neutro Abs: 6.1 10*3/uL (ref 1.4–7.7)
Neutrophils Relative %: 72.5 % (ref 43.0–77.0)
Platelets: 259 10*3/uL (ref 150.0–400.0)
RBC: 4.61 Mil/uL (ref 4.22–5.81)
RDW: 13 % (ref 11.5–15.5)
WBC: 8.4 10*3/uL (ref 4.0–10.5)

## 2021-04-24 LAB — VITAMIN D 25 HYDROXY (VIT D DEFICIENCY, FRACTURES): VITD: 25.77 ng/mL — ABNORMAL LOW (ref 30.00–100.00)

## 2021-04-24 LAB — COMPREHENSIVE METABOLIC PANEL
ALT: 19 U/L (ref 0–53)
AST: 19 U/L (ref 0–37)
Albumin: 4.2 g/dL (ref 3.5–5.2)
Alkaline Phosphatase: 83 U/L (ref 39–117)
BUN: 17 mg/dL (ref 6–23)
CO2: 28 mEq/L (ref 19–32)
Calcium: 9.2 mg/dL (ref 8.4–10.5)
Chloride: 107 mEq/L (ref 96–112)
Creatinine, Ser: 1.12 mg/dL (ref 0.40–1.50)
GFR: 70.7 mL/min (ref >60.00)
Glucose, Bld: 94 mg/dL (ref 70–99)
Potassium: 4.1 mEq/L (ref 3.5–5.1)
Sodium: 142 mEq/L (ref 135–145)
Total Bilirubin: 0.4 mg/dL (ref 0.2–1.2)
Total Protein: 7 g/dL (ref 6.0–8.3)

## 2021-04-24 LAB — HEMOGLOBIN A1C: Hgb A1c MFr Bld: 5.8 % (ref 4.6–6.5)

## 2021-04-24 LAB — TSH: TSH: 2.22 u[IU]/mL (ref 0.35–5.50)

## 2021-04-24 LAB — IFOBT (OCCULT BLOOD): IFOBT: NEGATIVE

## 2021-04-24 NOTE — Progress Notes (Signed)
Subjective:  Patient ID: Nicholas Potts, male    DOB: [birth year]  Age: 60 y.o. MRN: [id number]  CC:  Chief Complaint  Patient presents with   Back Pain    Pt fell backwards and hurt his back in lower portion, pt reports yesterday he had fallen   Dizziness    Pt reports dizzy feeling when he wakes every morning for 2 weeks, last until he stands then dissipates     HPI Nicholas Potts presents for   Spanish spoken, dtr translating - refuses Marketing executive.   Dizziness Notes in the morning past 2 weeks when he wakes up, notes when leaning to left when putting down cpap.  Improves after standing.  Notes at work if leaning forward at times  - usually in the morning. No syncope/near syncope. Denies chest pain/palpitations.  Dark stools at times - past week. No blood noted. No abd pain. Lightheaded, no room spinning.  Not skipping meals, eats breakfast each day, drinking fluids throughout the day.  Colonoscopy 08/05/20: - Eight 1 to 2 mm polyps in the descending colon, in the transverse colon, in the ascending colon and in the cecum, removed with a cold biopsy forceps. Resected and retrieved. - Three 4 to 6 mm polyps in the sigmoid colon, in the descending colon and in the ascending colon, removed with a cold snare. Resected and retrieved. - Melanosis in the colon. - Diverticulosis in the sigmoid colon. - Non-bleeding internal hemorrhoids. - The examination was otherwise normal.  Treated with CPAP for severe obstructive sleep apnea.  Intermittent palpitations at that time.  No fever, no chest pains.  100% compliance with CPAP at that time.Referred to cardiology, but no-show and canceled appointments.  Sleep specialist eval in May noted, doing well and compliant with CPAP at that time.  Plan for overnight oximetry to make sure previous desaturations had resolved.  Last lab work in December 2021 with normal TSH,Borderline low vitamin D at 29.8, hyperlipidemia, glucose 110 with  A1c 5.8, otherwise normal electrolytes.  Hemoglobin 15.3.  Normal CBC.  No current antihypertensives.  Low back pain after fall Fall yesterday. Working, reaching up for some nails, stepped on palate, palate broke, lost balance and fell backwards onto concrete floor- landed on back. No head injury. No other injuries or pain.  Pain at lower back, bilateral. No hematuria.  No bruising known.  Able to ambulate. No weakness.  Slight better today, still sore. No bowel or bladder incontinence, no saddle anesthesia, no lower extremity weakness. Tx: ibuprofen 600mg  x1 yesterday. Ice - min relief.  No prior back injury/surgery.      History Patient Active Problem List   Diagnosis Date Noted   History of colonic polyps    Polyp of ascending colon    Polyp of transverse colon    Polyp of cecum    Polyp of descending colon    Polyp of sigmoid colon    Hypersomnia 11/29/2017   Other headache syndrome 05/05/2017   Vertigo 05/05/2017   IFG (impaired fasting glucose) 08/21/2016   Obesity (BMI 30-39.9) 06/23/2013   Stress-induced cardiomyopathy -- essentially resolved    CVA (cerebral infarction)    OSA (obstructive sleep apnea)    Hyperlipidemia    Physical deconditioning 01/24/2013   Tinea pedis 01/18/2013   Essential hypertension 01/13/2013   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[place]) 01/11/2013   ARF (acute renal failure) with tubular necrosis requiring HD 01/11/2013   Anemia 01/11/2013   Thrombocytopenia, unspecified ([place]) 01/11/2013   History of  Cardiac and respiratory arrest 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 narcotic overdose 12/29/2012   Past Medical History:  Diagnosis Date   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[place]) 2014   post op    ARF (acute respiratory failure) ([place]) 2014   Blood dyscrasia    Chronic kidney disease 7253   ARF    Complication of anesthesia 2014   - post op - in combination of pain pills-  Cardiac and Respiratory Arrest- prior to sleep apnea diagonosis   Constipation    CVA  (cerebral infarction)    Hyperlipidemia    Hypertension    OSA (obstructive sleep apnea)    Pre-diabetes    Short-term memory loss    Sleep apnea    Stress-induced cardiomyopathy -- essentially resolved    Post-op Narcotic induced Acute Hypoxic respiratory failure with respiratory and PEA cardiac arrest following accidental narcotic overdose; initial [place] 20-20%, now improved to 55%   Was supported with Impella Cardiac Cath - non-obstructive CAD    Thrombocytopenia ([place]) 2014   Past Surgical History:  Procedure Laterality Date   ACHILLES TENDON REPAIR     BIOPSY  08/01/2019   Procedure: BIOPSY;  Surgeon: Mauri Pole, MD;  Location: [facility];  Service: Endoscopy;;   BIOPSY  08/05/2020   Procedure: BIOPSY;  Surgeon: Mauri Pole, MD;  Location: [facility];  Service: Endoscopy;;   CARDIAC CATHETERIZATION  12/29/2012   RN LHC --> Impella. for severe shock with low EF. Mild to moderate RV pressure elevation. Normal coronary arteries. Cardiac Output 2.5, Index 1.46.   COLONOSCOPY WITH PROPOFOL N/A 10/30/2015   Procedure: COLONOSCOPY WITH PROPOFOL;  Surgeon: Mauri Pole, MD;  Location: [facility];  Service: Endoscopy;  Laterality: N/A;   COLONOSCOPY WITH PROPOFOL N/A 08/01/2019   Procedure: COLONOSCOPY WITH PROPOFOL;  Surgeon: Mauri Pole, MD;  Location: [facility];  Service: Endoscopy;  Laterality: N/A;   COLONOSCOPY WITH PROPOFOL N/A 08/05/2020   Procedure: COLONOSCOPY WITH PROPOFOL;  Surgeon: Mauri Pole, MD;  Location: [facility];  Service: Endoscopy;  Laterality: N/A;   INSERTION OF DIALYSIS CATHETER Right 01/09/2013   Procedure: INSERTION OF DIALYSIS CATHETER;  Surgeon: Angelia Mould, MD;  Location: [facility];  Service: Vascular;  Laterality: Right;   LEFT AND RIGHT HEART CATHETERIZATION WITH CORONARY ANGIOGRAM  12/29/2012   Procedure: LEFT AND RIGHT HEART CATHETERIZATION WITH CORONARY ANGIOGRAM;  Surgeon: Sherren Mocha, MD;  [facility] CATH  LAB;  Service: Cardiovascular;;   POLYPECTOMY  08/01/2019   Procedure: POLYPECTOMY;  Surgeon: Mauri Pole, MD;  Location: [facility];  Service: Endoscopy;;   POLYPECTOMY  08/05/2020   Procedure: POLYPECTOMY;  Surgeon: Mauri Pole, MD;  Location: [facility];  Service: Endoscopy;;   TRANSTHORACIC ECHOCARDIOGRAM  12/29/2012   EF 25% with periapical HK/AKA.  --> Followup echo 7/25 -- EF of 35%   TRANSTHORACIC ECHOCARDIOGRAM  01/2013 ; 07/2015   a) EF 55-60%. No regional WMA. Grade 1 diastolic dysfunction. Mild LA dilatation.; b) Normal LV size with mild LV hypertrophy. EF 55-60%. Normal RV   Allergies  Allergen Reactions   Oxycodone     Oxygen levels drop   Prior to Admission medications   Medication Sig Start Date End Date Taking? Authorizing Provider  aspirin EC 81 MG tablet Take 1 tablet (81 mg total) by mouth daily. 05/13/17  Yes Almyra Deforest, PA  Multiple Vitamin (MULTIVITAMIN WITH MINERALS) TABS tablet Take 1 tablet by mouth daily.   Yes [provider]   Social History  Socioeconomic History   Marital status: Married    Spouse name: Not on file   Number of children: Not on file   Years of education: Not on file   Highest education level: Not on file  Occupational History   Not on file  Tobacco Use   Smoking status: Former    Types: Cigarettes    Quit date: 06/20/1986    Years since quitting: 34.8   Smokeless tobacco: Never  Substance and Sexual Activity   Alcohol use: Yes    Comment: History of heavy use but only social at present.  10/29/15 - "a beer every week or so"   Drug use: No    Comment: Prior cocaine use but none since 2000   Sexual activity: Never  Other Topics Concern   Not on file  Social History Narrative   He is widowed for 13 years, and has 2 daughters: [name] & [address] [place], and one son Ichiro Chesnut. the other history is daughter who is here with him today.    He works for [employer]  and [employer] as a [employer].   He quit smoking in 1988. He does drink alcohol socially, but he does have a history of heavy use in the past. He also has a prior history of cocaine use but none since 2000.   Social Determinants of Health   Financial Resource Strain: Not on file  Food Insecurity: Not on file  Transportation Needs: Not on file  Physical Activity: Not on file  Stress: Not on file  Social Connections: Not on file  Intimate Partner Violence: Not on file    Review of Systems Per HPI.   Objective:   Vitals:   04/24/21 1034  BP: 130/78  Pulse: 83  Resp: 16  Temp: 98.2 F (36.8 C)  TempSrc: Temporal  SpO2: 95%  Weight: 266 lb 6.4 oz (120.8 kg)  Height: 5\' 7"  (1.702 m)     Physical Exam Vitals reviewed.  Constitutional:      Appearance: He is well-developed. He is obese.  HENT:     Head: Normocephalic and atraumatic.  Neck:     Vascular: No carotid bruit or JVD.  Cardiovascular:     Rate and Rhythm: Normal rate and regular rhythm.     Heart sounds: Normal heart sounds. No murmur heard. Pulmonary:     Effort: Pulmonary effort is normal.     Breath sounds: Normal breath sounds. No rales.  Genitourinary:    Rectum: Guaiac result negative. No tenderness (Light brown stool noted with digital rectal exam, no blood.), anal fissure or external hemorrhoid. Normal anal tone.  Musculoskeletal:     Right lower leg: No edema.     Left lower leg: No edema.     Comments: Lumbar spine, skin intact, no wounds, no ecchymosis.  Diffuse tenderness across the lower lumbar spine and paraspinals including some midline bony tenderness of the lumbar spine.  Ambulates without assistance, some discomfort/guarding with flexion, range of motion.  Negative seated straight leg raise.- sore R low back only.   Skin:    General: Skin is warm and dry.  Neurological:     General: No focal deficit present.     Mental Status: He is alert. He is disoriented.     Motor: No weakness.     Gait: Gait  normal.  Psychiatric:        Mood and Affect: Mood normal.    Negative hemocult.  EKG, sinus rhythm, rate 77.  No acute findings.  No apparent significant changes compared to 07/13/2019 EKG.   Assessment & Plan:  Nicholas Potts is a 60 y.o. male . Dizziness - Plan: CBC with Differential/Platelet, TSH, IFOBT POC (occult bld, rslt in office), EKG 12-Lead, IFOBT POC (occult bld, rslt in office) Dark stools - Plan: IFOBT POC (occult bld, rslt in office), IFOBT POC (occult bld, rslt in office)  -More likely orthostatic symptoms then vertigo.  Nonfocal neurologic exam.  Prior dark stool but heme-negative in office.  Check CBC.  RTC precautions if dark stool returns.  No acute findings on EKG.  Check labs, maintenance of hydration, slow rise in morning with sitting on edge of bed first.  RTC precautions if persistent/ER precautions if worsening.  Hyperglycemia - Plan: Comprehensive metabolic panel, Hemoglobin A1c  -Check A1c as prior level at prediabetes.  Vitamin D deficiency - Plan: Vitamin D (25 hydroxy)  -Check labs  Contusion of lower back, initial encounter - Plan: DG Lumbar Spine Complete  -Suspect a contusion of the lumbar spine and paraspinal muscles.  No red flags on history/exam except he does have some midline bony tenderness.  Imaging ordered, symptomatic care discussed with handout given and note for work.  Recheck 2 weeks, sooner if worse.  No orders of the defined types were placed in this encounter.  Patient Instructions  Make sure to continue to drink plenty of fluids throughout the day.  Regular meals throughout the day.  Sit up slowly from bed in the morning for a few minutes before standing up and walking.  I will check blood work and other tests today to look at other causes of dizziness.  Recheck in the next 2 weeks.  Sooner or emergency room if any worse symptoms.  Back x-ray at [facility].  See information below.  Tylenol is preferable for now, ice if  needed, out of work this weekend and okay to return to work once pain improves.  Follow-up if not improving into next week. Return to the clinic or go to the nearest emergency room if any of your symptoms worsen or new symptoms occur.  Dolor de espalda agudo en los adultos Acute Back Pain, Adult El dolor de espalda agudo es repentino y por lo general no dura mucho tiempo. Se debe generalmente a una lesin de los msculos y tejidos de la espalda. La lesin puede ser el resultado de: Estiramiento en exceso o desgarro de un msculo, tendn o ligamento. Los ligamentos son tejidos que [employer]. Levantar algo de forma incorrecta puede producir un esguince de espalda. Desgaste (degeneracin) de los discos vertebrales. Los discos vertebrales son tejidos circulares que proporcionan amortiguacin entre los huesos de la columna vertebral (vrtebras). Movimientos de giro, como al practicar deportes o realizar trabajos de [place]. Un golpe en la espalda. Artritis. Es posible Hydrologist un examen fsico, anlisis de laboratorio u otros estudios de diagnstico por imgenes para Animator causa del Social research officer, government. El dolor de espalda agudo generalmente desaparece con reposo y cuidados en la casa. Siga estas instrucciones en su casa: Control del dolor, la rigidez y Forensic psychologist los medicamentos de venta libre y los recetados solamente como se lo haya indicado el mdico. El tratamiento puede incluir medicamentos para Conservation officer, historic buildings y la inflamacin que se toman por la boca o que se aplican sobre la piel, o relajantes musculares. El mdico puede recomendarle que se aplique hielo durante las primeras 24 a 48 horas  despus del comienzo del dolor. Para hacer esto: Ponga el hielo en una bolsa plstica. Coloque una toalla entre la piel y Therapist, nutritional. Aplique el hielo durante 20 minutos, 2 o 3 veces por da. Retire el hielo si la piel se pone de color rojo brillante. Esto es [employer]. Si no puede sentir  dolor, calor o fro, tiene un mayor riesgo de que se dae la zona. Si se lo indican, aplique calor en la zona afectada con la frecuencia que le haya indicado el mdico. Use la fuente de calor que el mdico le recomiende, como una compresa de calor hmedo o una almohadilla trmica. Coloque una toalla entre la piel y la fuente de Freight forwarder. Aplique calor durante 20 a 30 minutos. Retire la fuente de calor si la piel se pone de color rojo brillante. Esto es especialmente importante si no puede sentir dolor, calor o fro. Corre un mayor riesgo de sufrir quemaduras. Actividad  No permanezca en la cama. Hacer reposo en la cama por ms de 1 a 2 das puede demorar su recuperacin. Mantenga una buena postura al sentarse y pararse. No se incline hacia adelante al sentarse ni se encorve al pararse. Si trabaja en un escritorio, sintese cerca de este para no tener que inclinarse. Mantenga el mentn hacia abajo. Mantenga el cuello hacia atrs y los codos flexionados en un ngulo de 90 grados (ngulo recto). Cuando conduzca, sintese elevado y cerca del volante. Agregue un apoyo para la espalda (lumbar) al asiento del automvil, si es necesario. Realice caminatas cortas en superficies planas tan pronto como le sea posible. Trate de caminar un poco ms de Publishing copy. No se siente, conduzca o permanezca de pie en un mismo lugar durante ms de 30 minutos seguidos. Pararse o sentarse durante largos perodos de Radiographer, therapeutic la espalda. No conduzca ni use maquinaria pesada mientras toma analgsicos recetados. Use tcnicas apropiadas para levantar objetos. Cuando se inclina y Chief Executive Officer un [place], utilice posiciones que no sobrecarguen tanto la espalda: [place]. Mantenga la carga cerca del cuerpo. No se tuerza. Haga actividad fsica habitualmente como se lo haya indicado el mdico. Hacer ejercicios ayuda a que la espalda sane ms rpido y [place] a Product/process development scientist las lesiones de la espalda al [employer]  msculos fuertes y flexibles. Trabaje con un fisioterapeuta para crear un programa de ejercicios seguros, segn lo recomiende el mdico. Haga ejercicios como se lo haya indicado el fisioterapeuta. Estilo de vida Mantenga un peso saludable. El sobrepeso sobrecarga la espalda y hace que resulte difcil tener una buena [place]. Evite actividades o situaciones que lo hagan sentirse ansioso o estresado. El estrs y la ansiedad aumentan la tensin muscular y pueden empeorar el dolor de espalda. Aprenda formas de [employer]ent Financial ansiedad y [place], como a travs del ejercicio. Instrucciones generales [place] sobre un colchn firme en una posicin cmoda. Intente acostarse de costado, con las rodillas ligeramente flexionadas. Si se recuesta [employer], coloque una almohada debajo de las rodillas. Mantenga la cabeza y el cuello en lnea recta con la columna vertebral (posicin neutra) cuando use equipos electrnicos como telfonos inteligentes o tablets. Para hacer esto: Levante el telfono inteligente o la tablet para Consulting civil engineer de inclinar la cabeza o el cuello para mirar hacia abajo. Coloque el telfono inteligente o la tablet al nivel de su cara mientras mira la pantalla. Siga el plan de tratamiento como se lo haya indicado el mdico. Esto puede incluir: Terapia cognitiva o conductual. Acupuntura  o terapia de masajes. Yoga o meditacin. Comunquese con un mdico si: Siente un dolor que no se alivia con reposo o medicamentos. Siente mucho dolor que se extiende a las piernas o las nalgas. El dolor no mejora luego de 2 semanas. Siente dolor por la noche. Pierde peso sin proponrselo. Tiene fiebre o escalofros. Siente nuseas o vmitos. Siente dolor abdominal. Solicite ayuda de inmediato si: Tiene nuevos problemas para controlar la vejiga o los intestinos. Siente debilidad o adormecimiento inusuales en los brazos o en las piernas. Siente que va a desmayarse. Estos sntomas pueden  representar un problema grave que constituye Engineer, maintenance (IT). No espere a ver si los sntomas desaparecen. Solicite atencin mdica de inmediato. Comunquese con el servicio de emergencias de su localidad (911 en los [place]). No conduzca por sus propios medios Principal Financial. Resumen El dolor de espalda agudo es repentino y por lo general no dura mucho tiempo. Use tcnicas apropiadas para levantar objetos. Cuando se inclina y levanta un [place], utilice posiciones que no sobrecarguen tanto la espalda. Tome los medicamentos de venta libre y los recetados solamente como se lo haya indicado el mdico, y aplquese calor o hielo segn las indicaciones. Esta informacin no tiene Marine scientist el consejo del mdico. Asegrese de hacerle al mdico cualquier pregunta que tenga. Document Revised: 09/11/2020 Document Reviewed: 09/11/2020 Elsevier Patient Education  2022 [place] son un problema muy frecuente. Se trata de una sensacin de inestabilidad o desvanecimiento. Puede sentir que se va a desmayar. Los Terex Corporation pueden provocarle una lesin si se tropieza o se cae. Las Engineer, manufacturing de todas las edades pueden sufrir Tree surgeon, Armed forces training and education officer es ms frecuente en los adultos [place]. Esta afeccin puede tener muchas causas, entre las que se pueden Kimberly[employer]ark, la deshidratacin y [place]. Siga estas instrucciones en su casa: Comida y bebida  Beba suficiente lquido como para Theatre manager la orina de color amarillo plido. Esto evita la deshidratacin. Trate de beber ms lquidos transparentes, como agua. No beba alcohol. Limite el consumo de cafena si el mdico se lo indica. Verifique los ingredientes y la informacin nutricional para saber si un alimento o una bebida contienen cafena. Limite el consumo de sal (sodio) si el mdico se lo indica. Verifique los ingredientes y la informacin nutricional para saber si un alimento o una bebida contienen  sodio. Actividad  Evite los movimientos rpidos. Levntese de las sillas con lentitud y apyese hasta sentirse bien. Por la maana, sintese primero a un lado de la cama. Cuando se sienta bien, pngase lentamente de pie mientras se sostiene de algo, hasta que sepa que ha logrado el equilibrio. Mueva las piernas con frecuencia si debe estar de pie en un lugar durante mucho tiempo. Mientras est de pie, contraiga y relaje los msculos de las piernas. No conduzca vehculos ni opere maquinaria si se siente mareado. Evite agacharse si se siente mareado. En su casa, coloque los objetos de modo que le resulte fcil alcanzarlos sin Office manager. Estilo de vida No consuma ningn producto que contenga nicotina o tabaco. Estos productos incluyen cigarrillos, tabaco para Higher education careers adviser y aparatos de vapeo, como los Psychologist, sport and exercise. Si necesita ayuda para dejar de fumar, consulte al [employer]. Trate de reducir el nivel de estrs con mtodos como el yoga o la meditacin. Hable con el mdico si necesita ayuda para controlar el nivel de estrs. Instrucciones generales Controle sus mareos para ver si hay cambios. Use los medicamentos de venta libre y los recetados solamente  como se lo haya indicado el mdico. Hable con el mdico si cree que los medicamentos que est tomando son la causa de sus mareos. Infrmele a un amigo o a un familiar si se siente mareado. Pdale a esta persona que llame al mdico si observa cambios en su comportamiento. Concurra a [place]. Esto es importante. Comunquese con un mdico si: Los mareos no desaparecen o tiene sntomas nuevos. Los [employer]rex Corporation o la sensacin de Engineer, petroleum. Siente nuseas. Se le redujo la audicin. Tiene fiebre. Dolor o rigidez en el cuello. Los Terex Corporation derivan en una lesin o una cada. Solicite ayuda de inmediato si: Vomita o tiene diarrea y no puede comer ni beber nada. Tiene dificultad para hablar, caminar, tragar o [employer], las [place]. Se siente constantemente dbil. Tiene cualquier tipo de sangrado. No piensa con claridad o tiene dificultad para armar oraciones. Es posible que un amigo o un familiar adviertan que esto ocurre. Tiene dolor de pecho, dolor abdominal, sudoracin o Risk manager. Tiene cambios en la visin o le aparece un dolor de cabeza intenso. Estos sntomas pueden representar un problema grave que constituye Engineer, maintenance (IT). No espere a ver si los sntomas desaparecen. Solicite atencin mdica de inmediato. Comunquese con el servicio de emergencias de su localidad (911 en los [place]). No conduzca por sus propios medios Principal Financial. Resumen Los mareos son Ardelia Mems sensacin de inestabilidad o desvanecimiento. Esta afeccin puede tener muchas causas, entre las que se pueden [employer]ly[employer]ark, la deshidratacin y [place]. Las Engineer, manufacturing de todas las edades pueden sufrir Tree surgeon, Armed forces training and education officer es ms frecuente en los adultos [place]. Beba suficiente lquido como para Theatre manager la orina de color amarillo plido. No beba alcohol. Evite los movimientos rpidos si se siente mareado. Controle sus mareos para ver si hay cambios. Esta informacin no tiene Marine scientist el consejo del mdico. Asegrese de hacerle al mdico cualquier pregunta que tenga. Document Revised: 05/20/2020 Document Reviewed: 05/20/2020 Elsevier Patient Education  2022 [place],   Merri Ray, MD [place], [facility] 04/24/21 11:27 AM

## 2021-04-24 NOTE — Patient Instructions (Signed)
Make sure to continue to drink plenty of fluids throughout the day.  Regular meals throughout the day.  Sit up slowly from bed in the morning for a few minutes before standing up and walking.  I will check blood work and other tests today to look at other causes of dizziness.  Recheck in the next 2 weeks.  Sooner or emergency room if any worse symptoms.  Back x-ray at [facility].  See information below.  Tylenol is preferable for now, ice if needed, out of work this weekend and okay to return to work once pain improves.  Follow-up if not improving into next week. Return to the clinic or go to the nearest emergency room if any of your symptoms worsen or new symptoms occur.  Dolor de espalda agudo en los adultos Acute Back Pain, Adult El dolor de espalda agudo es repentino y por lo general no dura mucho tiempo. Se debe generalmente a una lesin de los msculos y tejidos de la espalda. La lesin puede ser el resultado de: Estiramiento en exceso o desgarro de un msculo, tendn o ligamento. Los ligamentos son tejidos que [employer]. Levantar algo de forma incorrecta puede producir un esguince de espalda. Desgaste (degeneracin) de los discos vertebrales. Los discos vertebrales son tejidos circulares que proporcionan amortiguacin entre los huesos de la columna vertebral (vrtebras). Movimientos de giro, como al practicar deportes o realizar trabajos de [place]. Un golpe en la espalda. Artritis. Es posible Hydrologist un examen fsico, anlisis de laboratorio u otros estudios de diagnstico por imgenes para Animator causa del Social research officer, government. El dolor de espalda agudo generalmente desaparece con reposo y cuidados en la casa. Siga estas instrucciones en su casa: Control del dolor, la rigidez y Forensic psychologist los medicamentos de venta libre y los recetados solamente como se lo haya indicado el mdico. El tratamiento puede incluir medicamentos para Conservation officer, historic buildings y la inflamacin que se toman  por la boca o que se aplican sobre la piel, o relajantes musculares. El mdico puede recomendarle que se aplique hielo durante las primeras 24 a 81 horas despus del comienzo del Social research officer, government. Para hacer esto: Ponga el hielo en una bolsa plstica. Coloque una toalla entre la piel y Therapist, nutritional. Aplique el hielo durante 20 minutos, 2 o 3 veces por da. Retire el hielo si la piel se pone de color rojo brillante. Esto es [employer]. Si no puede sentir dolor, calor o fro, tiene un mayor riesgo de que se dae la zona. Si se lo indican, aplique calor en la zona afectada con la frecuencia que le haya indicado el mdico. Use la fuente de calor que el mdico le recomiende, como una compresa de calor hmedo o una almohadilla trmica. Coloque una toalla entre la piel y la fuente de Freight forwarder. Aplique calor durante 20 a 30 minutos. Retire la fuente de calor si la piel se pone de color rojo brillante. Esto es especialmente importante si no puede sentir dolor, calor o fro. Corre un mayor riesgo de sufrir quemaduras. Actividad  No permanezca en la cama. Hacer reposo en la cama por ms de 1 a 2 das puede demorar su recuperacin. Mantenga una buena postura al sentarse y pararse. No se incline hacia adelante al sentarse ni se encorve al pararse. Si trabaja en un escritorio, sintese cerca de este para no tener que inclinarse. Mantenga el mentn hacia abajo. Mantenga el cuello hacia atrs y los codos flexionados en un ngulo de 90 grados (  ngulo recto). Cuando conduzca, sintese elevado y cerca del volante. Agregue un apoyo para la espalda (lumbar) al asiento del automvil, si es necesario. Realice caminatas cortas en superficies planas tan pronto como le sea posible. Trate de caminar un poco ms de Publishing copy. No se siente, conduzca o permanezca de pie en un mismo lugar durante ms de 30 minutos seguidos. Pararse o sentarse durante largos perodos de Radiographer, therapeutic la espalda. No conduzca ni use maquinaria  pesada mientras toma analgsicos recetados. Use tcnicas apropiadas para levantar objetos. Cuando se inclina y Chief Executive Officer un [place], utilice posiciones que no sobrecarguen tanto la espalda: [place]. Mantenga la carga cerca del cuerpo. No se tuerza. Haga actividad fsica habitualmente como se lo haya indicado el mdico. Hacer ejercicios ayuda a que la espalda sane ms rpido y [place] a Product/process development scientist las lesiones de la espalda al [employer] msculos fuertes y flexibles. Trabaje con un fisioterapeuta para crear un programa de ejercicios seguros, segn lo recomiende el mdico. Haga ejercicios como se lo haya indicado el fisioterapeuta. Estilo de vida Mantenga un peso saludable. El sobrepeso sobrecarga la espalda y hace que resulte difcil tener una buena [place]. Evite actividades o situaciones que lo hagan sentirse ansioso o estresado. El estrs y la ansiedad aumentan la tensin muscular y pueden empeorar el dolor de espalda. Aprenda formas de [employer] ansiedad y [place], como a travs del ejercicio. Instrucciones generales [place] sobre un colchn firme en una posicin cmoda. Intente acostarse de costado, con las rodillas ligeramente flexionadas. Si se recuesta [employer], coloque una almohada debajo de las rodillas. Mantenga la cabeza y el cuello en lnea recta con la columna vertebral (posicin neutra) cuando use equipos electrnicos como telfonos inteligentes o tablets. Para hacer esto: Levante el telfono inteligente o la tablet para Consulting civil engineer de inclinar la cabeza o el cuello para mirar hacia abajo. Coloque el telfono inteligente o la tablet al nivel de su cara mientras mira la pantalla. Siga el plan de tratamiento como se lo haya indicado el mdico. Esto puede incluir: Terapia cognitiva o conductual. Acupuntura o terapia de masajes. Yoga o meditacin. Comunquese con un mdico si: Siente un dolor que no se alivia con reposo o medicamentos. Siente mucho dolor que se  extiende a las piernas o las nalgas. El dolor no mejora luego de 2 semanas. Siente dolor por la noche. Pierde peso sin proponrselo. Tiene fiebre o escalofros. Siente nuseas o vmitos. Siente dolor abdominal. Solicite ayuda de inmediato si: Tiene nuevos problemas para controlar la vejiga o los intestinos. Siente debilidad o adormecimiento inusuales en los brazos o en las piernas. Siente que va a desmayarse. Estos sntomas pueden representar un problema grave que constituye Engineer, maintenance (IT). No espere a ver si los sntomas desaparecen. Solicite atencin mdica de inmediato. Comunquese con el servicio de emergencias de su localidad (911 en los [place]). No conduzca por sus propios medios [employer]. Resumen El dolor de espalda agudo es repentino y por lo general no dura mucho tiempo. Use tcnicas apropiadas para levantar objetos. Cuando se inclina y levanta un [place], utilice posiciones que no sobrecarguen tanto la espalda. Tome los medicamentos de venta libre y los recetados solamente como se lo haya indicado el mdico, y aplquese calor o hielo segn las indicaciones. Esta informacin no tiene Marine scientist el consejo del mdico. Asegrese de hacerle al mdico cualquier pregunta que tenga. Document Revised: 09/11/2020 Document Reviewed: 09/11/2020 Elsevier Patient Education  [place].  Mareos Dizziness Los mareos son un problema muy frecuente. Se trata de una sensacin de inestabilidad o desvanecimiento. Puede sentir que se va a desmayar. Los [employer] pueden provocarle una lesin si se tropieza o se cae. Las Engineer, manufacturing de todas las edades pueden sufrir Tree surgeon, Armed forces training and education officer es ms frecuente en los adultos [place]. Esta afeccin puede tener muchas causas, entre las que se pueden [employer], la deshidratacin y [place]. Siga estas instrucciones en su casa: Comida y bebida  Beba suficiente lquido como para Theatre manager la orina de color amarillo plido.  Esto evita la deshidratacin. Trate de beber ms lquidos transparentes, como agua. No beba alcohol. Limite el consumo de cafena si el mdico se lo indica. Verifique los ingredientes y la informacin nutricional para saber si un alimento o una bebida contienen cafena. Limite el consumo de sal (sodio) si el mdico se lo indica. Verifique los ingredientes y la informacin nutricional para saber si un alimento o una bebida contienen sodio. Actividad  Evite los movimientos rpidos. Levntese de las sillas con lentitud y apyese hasta sentirse bien. Por la maana, sintese primero a un lado de la cama. Cuando se sienta bien, pngase lentamente de pie mientras se sostiene de algo, hasta que sepa que ha logrado el equilibrio. Mueva las piernas con frecuencia si debe estar de pie en un lugar durante mucho tiempo. Mientras est de pie, contraiga y relaje los msculos de las piernas. No conduzca vehculos ni opere maquinaria si se siente mareado. Evite agacharse si se siente mareado. En su casa, coloque los objetos de modo que le resulte fcil alcanzarlos sin Office manager. Estilo de vida No consuma ningn producto que contenga nicotina o tabaco. Estos productos incluyen cigarrillos, tabaco para Higher education careers adviser y aparatos de vapeo, como los Psychologist, sport and exercise. Si necesita ayuda para dejar de fumar, consulte al [employer]. Trate de reducir el nivel de estrs con mtodos como el yoga o la meditacin. Hable con el mdico si necesita ayuda para controlar el nivel de estrs. Instrucciones generales Controle sus mareos para ver si hay cambios. Use los medicamentos de venta libre y los recetados solamente como se lo haya indicado el mdico. Hable con el mdico si cree que los medicamentos que est tomando son la causa de sus mareos. Infrmele a un amigo o a un familiar si se siente mareado. [place] a esta persona que llame al mdico si observa cambios en su comportamiento. Concurra a [place]. Esto  es importante. Comunquese con un mdico si: Los mareos no desaparecen o tiene sntomas nuevos. Los [employer] o la sensacin de Engineer, petroleum. Siente nuseas. Se le redujo la audicin. Tiene fiebre. Dolor o rigidez en el cuello. Los [employer] Corporation derivan en una lesin o una cada. Solicite ayuda de inmediato si: Vomita o tiene diarrea y no puede comer ni beber nada. Tiene dificultad para hablar, caminar, tragar o [employer], las [place]. Se siente constantemente dbil. Tiene cualquier tipo de sangrado. No piensa con claridad o tiene dificultad para armar oraciones. Es posible que un amigo o un familiar adviertan que esto ocurre. Tiene dolor de pecho, dolor abdominal, sudoracin o Risk manager. Tiene cambios en la visin o le aparece un dolor de cabeza intenso. Estos sntomas pueden representar un problema grave que constituye Engineer, maintenance (IT). No espere a ver si los sntomas desaparecen. Solicite atencin mdica de inmediato. Comunquese con el servicio de emergencias de su localidad (911 en los [place]). No conduzca por sus propios medios hasta  el hospital. Resumen Los mareos son Ardelia Mems sensacin de inestabilidad o desvanecimiento. Esta afeccin puede tener muchas causas, entre las que se pueden [employer], la deshidratacin y [place]. Las Engineer, manufacturing de todas las edades pueden sufrir Tree surgeon, Armed forces training and education officer es ms frecuente en los adultos [place]. Beba suficiente lquido como para Theatre manager la orina de color amarillo plido. No beba alcohol. Evite los movimientos rpidos si se siente mareado. Controle sus mareos para ver si hay cambios. Esta informacin no tiene Marine scientist el consejo del mdico. Asegrese de hacerle al mdico cualquier pregunta que tenga. Document Revised: 05/20/2020 Document Reviewed: 05/20/2020 Elsevier Patient Education  2022 [employer].

## 2021-04-25 ENCOUNTER — Encounter: Payer: Self-pay | Admitting: Family Medicine

## 2021-05-12 ENCOUNTER — Encounter: Payer: Self-pay | Admitting: Family Medicine

## 2021-05-12 ENCOUNTER — Ambulatory Visit: Payer: BC Managed Care – PPO | Admitting: Family Medicine

## 2021-05-12 VITALS — BP 130/78 | HR 81 | Temp 98.2°F | Resp 17 | Ht 67.0 in | Wt 268.8 lb

## 2021-05-12 DIAGNOSIS — R42 Dizziness and giddiness: Secondary | ICD-10-CM | POA: Diagnosis not present

## 2021-05-12 DIAGNOSIS — S300XXD Contusion of lower back and pelvis, subsequent encounter: Secondary | ICD-10-CM

## 2021-05-12 DIAGNOSIS — R519 Headache, unspecified: Secondary | ICD-10-CM

## 2021-05-12 DIAGNOSIS — H029 Unspecified disorder of eyelid: Secondary | ICD-10-CM | POA: Diagnosis not present

## 2021-05-12 DIAGNOSIS — E559 Vitamin D deficiency, unspecified: Secondary | ICD-10-CM

## 2021-05-12 DIAGNOSIS — Z8669 Personal history of other diseases of the nervous system and sense organs: Secondary | ICD-10-CM

## 2021-05-12 MED ORDER — AZELASTINE HCL 0.05 % OP SOLN
1.0000 [drp] | Freq: Two times a day (BID) | OPHTHALMIC | 1 refills | Status: DC
Start: 2021-05-12 — End: 2022-10-15

## 2021-05-12 NOTE — Patient Instructions (Addendum)
Try allergy drops first, but if itching or eyelid issues not improving then I recommend meeting with eye specialist. Let me know if you need a referral. Pataday  or Zaditor over the counter are other options over the counter if less costly.   Labs overall ok last visit. Vitamin D borderline low - over the counter 2000 units vitamin D supplement once per day may help.  I will refer you to neurology and order CT scan for headache and dizziness. Keep appointment for physical.  Return to the clinic or go to the nearest emergency room if any of your symptoms worsen or new symptoms occur.

## 2021-05-12 NOTE — Progress Notes (Signed)
Subjective:  Patient ID: Nicholas Potts, male    DOB: 11-24-1960  Age: 60 y.o. MRN: [id number]  CC:  Chief Complaint  Patient presents with   Dizziness    Pt reports the dizziness is the same    Back Pain    Pt reports back pain has been somewhat better   Eye Problem    Pt reports he feels his eyelids are sticking together started about 1 month ago, no discharge in the mornings, denies pain or dryness in the eyes     HPI Nicholas Potts presents for  Follow-up from last visit.  Presents with interpreter today as well as daughter.  Eyelids sticking: New concern, noted past 1 month. Feels like stick together after closing.  No discharge usually. Some tears with yawning.  No am crusting.  Slight itching of eyes, no treatments.   Dizziness Discussed November 17.  2-week history.  Noted with leaning forward.  Reported dark stools but heme-negative stool and normal CBC.  Mildly low vitamin D but other labs reassuring.  Counseled on sitting up slowly when rising from sleep as well as getting up to stand and walk, continued hydration. Still having some dizziness.  Similar to last visit. No changes.  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  Eating BF, lunch, dinner. Drinking fluids throughout day - some increased fluids since last visit.  No focal weakness. HA 1-2 times per week. New HA's.  No CP/palpitations.   Results for orders placed or performed in visit on 04/24/21  CBC with Differential/Platelet  Result Value Ref Range   WBC 8.4 4.0 - 10.5 K/uL   RBC 4.61 4.22 - 5.81 Mil/uL   Hemoglobin 15.0 13.0 - 17.0 g/dL   HCT 45.3 39.0 - 52.0 %   MCV 98.3 78.0 - 100.0 fl   MCHC 33.1 30.0 - 36.0 g/dL   RDW 13.0 11.5 - 15.5 %   Platelets 259.0 150.0 - 400.0 K/uL   Neutrophils Relative % 72.5 43.0 - 77.0 %   Lymphocytes Relative 18.1 12.0 - 46.0 %   Monocytes Relative 7.7 3.0 - 12.0 %   Eosinophils Relative 1.2 0.0 - 5.0 %    Basophils Relative 0.5 0.0 - 3.0 %   Neutro Abs 6.1 1.4 - 7.7 K/uL   Lymphs Abs 1.5 0.7 - 4.0 K/uL   Monocytes Absolute 0.6 0.1 - 1.0 K/uL   Eosinophils Absolute 0.1 0.0 - 0.7 K/uL   Basophils Absolute 0.0 0.0 - 0.1 K/uL  Comprehensive metabolic panel  Result Value Ref Range   Sodium 142 135 - 145 mEq/L   Potassium 4.1 3.5 - 5.1 mEq/L   Chloride 107 96 - 112 mEq/L   CO2 28 19 - 32 mEq/L   Glucose, Bld 94 70 - 99 mg/dL   BUN 17 6 - 23 mg/dL   Creatinine, Ser 1.12 0.40 - 1.50 mg/dL   Total Bilirubin 0.4 0.2 - 1.2 mg/dL   Alkaline Phosphatase 83 39 - 117 U/L   AST 19 0 - 37 U/L   ALT 19 0 - 53 U/L   Total Protein 7.0 6.0 - 8.3 g/dL   Albumin 4.2 3.5 - 5.2 g/dL   GFR 70.70 >60.00 mL/min   Calcium 9.2 8.4 - 10.5 mg/dL  Hemoglobin A1c  Result Value Ref Range   Hgb A1c MFr Bld 5.8 4.6 - 6.5 %  TSH  Result Value Ref Range   TSH 2.22 0.35 -  5.50 uIU/mL  Vitamin D (25 hydroxy)  Result Value Ref Range   VITD 25.77 (L) 30.00 - 100.00 ng/mL  IFOBT POC (occult bld, rslt in office)  Result Value Ref Range   IFOBT Negative    Low back pain, contusion See last visit.  Suspect a contusion of the lumbar spine as well as paraspinal muscles.  No red flags on history or exam.  Did have some midline bony tenderness, imaging was ordered without sign of fracture.  Mild degenerative changes noted.  Note given for work.  Symptomatic care discussed.  Has been improving - less sore. Few ibuprofen at times when more sore - 2-3 times per week.  Has been back at work - doing ok - back not limiting work. Avoiding heavy lifting, and some rest every few hours.    DG Lumbar Spine Complete  Result Date: 04/24/2021 CLINICAL DATA:  Fall, back pain EXAM: LUMBAR SPINE - COMPLETE 4+ VIEW COMPARISON:  11/04/2017 similar degree of disc FINDINGS: Five lumbar type vertebral segments. Vertebral body heights and alignment are maintained. No fracture identified. Height loss at L3-4. The remaining intervertebral disc  spaces are relatively preserved. Degenerative anterior endplate spurring. Lower lumbar facet arthrosis. IMPRESSION: 1. No acute fracture or traumatic listhesis of the lumbar spine. 2. Mild degenerative changes, similar to prior. Electronically Signed   By: Davina Poke D.O.   On: 04/24/2021 12:45      History Patient Active Problem List   Diagnosis Date Noted   History of colonic polyps    Polyp of ascending colon    Polyp of transverse colon    Polyp of cecum    Polyp of descending colon    Polyp of sigmoid colon    Hypersomnia 11/29/2017   Other headache syndrome 05/05/2017   Vertigo 05/05/2017   IFG (impaired fasting glucose) 08/21/2016   Obesity (BMI 30-39.9) 06/23/2013   Stress-induced cardiomyopathy -- essentially resolved    CVA (cerebral infarction)    OSA (obstructive sleep apnea)    Hyperlipidemia    Physical deconditioning 01/24/2013   Tinea pedis 01/18/2013   Essential hypertension 01/13/2013   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[place]) 01/11/2013   ARF (acute renal failure) with tubular necrosis requiring HD 01/11/2013   Anemia 01/11/2013   Thrombocytopenia, unspecified ([place]) 01/11/2013   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 12/29/2012   Past Medical History:  Diagnosis Date   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[place]) 2014   post op    ARF (acute respiratory failure) ([place]) 2014   Blood dyscrasia    Chronic kidney disease 0454   ARF    Complication of anesthesia 2014   - post op - in combination of pain pills-  Cardiac and Respiratory Arrest- prior to sleep apnea diagonosis   Constipation    CVA (cerebral infarction)    Hyperlipidemia    Hypertension    OSA (obstructive sleep apnea)    Pre-diabetes    Short-term memory loss    Sleep apnea    Stress-induced cardiomyopathy -- essentially resolved    Post-op Narcotic induced Acute Hypoxic respiratory failure with respiratory and PEA cardiac arrest following accidental  narcotic overdose; initial [place] 20-20%, now improved to 55%   Was supported with Impella Cardiac Cath - non-obstructive CAD    Thrombocytopenia ([place]) 2014   Past Surgical History:  Procedure Laterality Date   ACHILLES TENDON REPAIR     BIOPSY  08/01/2019   Procedure: BIOPSY;  Surgeon: Mauri Pole,  MD;  Location: [facility];  Service: Endoscopy;;   BIOPSY  08/05/2020   Procedure: BIOPSY;  Surgeon: Mauri Pole, MD;  Location: [facility];  Service: Endoscopy;;   CARDIAC CATHETERIZATION  12/29/2012   RN LHC --> Impella. for severe shock with low EF. Mild to moderate RV pressure elevation. Normal coronary arteries. Cardiac Output 2.5, Index 1.46.   COLONOSCOPY WITH PROPOFOL N/A 10/30/2015   Procedure: COLONOSCOPY WITH PROPOFOL;  Surgeon: Mauri Pole, MD;  Location: [facility];  Service: Endoscopy;  Laterality: N/A;   COLONOSCOPY WITH PROPOFOL N/A 08/01/2019   Procedure: COLONOSCOPY WITH PROPOFOL;  Surgeon: Mauri Pole, MD;  Location: [facility];  Service: Endoscopy;  Laterality: N/A;   COLONOSCOPY WITH PROPOFOL N/A 08/05/2020   Procedure: COLONOSCOPY WITH PROPOFOL;  Surgeon: Mauri Pole, MD;  Location: [facility];  Service: Endoscopy;  Laterality: N/A;   INSERTION OF DIALYSIS CATHETER Right 01/09/2013   Procedure: INSERTION OF DIALYSIS CATHETER;  Surgeon: Angelia Mould, MD;  Location: [facility];  Service: Vascular;  Laterality: Right;   LEFT AND RIGHT HEART CATHETERIZATION WITH CORONARY ANGIOGRAM  12/29/2012   Procedure: LEFT AND RIGHT HEART CATHETERIZATION WITH CORONARY ANGIOGRAM;  Surgeon: Sherren Mocha, MD;  [facility] C[facility];  Service: Cardiovascular;;   POLYPECTOMY  08/01/2019   Procedure: POLYPECTOMY;  Surgeon: Mauri Pole, MD;  Location: [facility];  Service: Endoscopy;;   POLYPECTOMY  08/05/2020   Procedure: POLYPECTOMY;  Surgeon: Mauri Pole, MD;  Location: [facility];  Service: Endoscopy;;   TRANSTHORACIC ECHOCARDIOGRAM   12/29/2012   EF 25% with periapical HK/AKA.  --> Followup echo 7/25 -- EF of 35%   TRANSTHORACIC ECHOCARDIOGRAM  01/2013 ; 07/2015   a) EF 55-60%. No regional WMA. Grade 1 diastolic dysfunction. Mild LA dilatation.; b) Normal LV size with mild LV hypertrophy. EF 55-60%. Normal RV   Allergies  Allergen Reactions   Oxycodone     Oxygen levels drop   Prior to Admission medications   Medication Sig Start Date End Date Taking? Authorizing Provider  aspirin EC 81 MG tablet Take 1 tablet (81 mg total) by mouth daily. 05/13/17  Yes Almyra Deforest, PA  Multiple Vitamin (MULTIVITAMIN WITH MINERALS) TABS tablet Take 1 tablet by mouth daily.   Yes [provider]   Social History   Socioeconomic History   Marital status: Married    Spouse name: Not on file   Number of children: Not on file   Years of education: Not on file   Highest education level: Not on file  Occupational History   Not on file  Tobacco Use   Smoking status: Former    Types: Cigarettes    Quit date: 06/20/1986    Years since quitting: 34.9   Smokeless tobacco: Never  Substance and Sexual Activity   Alcohol use: Yes    Comment: History of heavy use but only social at present.  10/29/15 - "a beer every week or so"   Drug use: No    Comment: Prior cocaine use but none since 2000   Sexual activity: Never  Other Topics Concern   Not on file  Social History Narrative   He is widowed for 13 years, and has 2 daughters: [name] & [address] [place], and one son Haleem Hanner. the other history is daughter who is here with him today.    He works for [employer] as a [employer].   He quit smoking in 1988.  He does drink alcohol socially, but he does have a history of heavy use in the past. He also has a prior history of cocaine use but none since 2000.   Social Determinants of Health   Financial Resource Strain: Not on file  Food Insecurity: Not on file  Transportation Needs: Not  on file  Physical Activity: Not on file  Stress: Not on file  Social Connections: Not on file  Intimate Partner Violence: Not on file    Review of Systems   Objective:   Vitals:   05/12/21 1153  BP: 130/78  Pulse: 81  Resp: 17  Temp: 98.2 F (36.8 C)  TempSrc: Temporal  SpO2: 95%  Weight: 268 lb 12.8 oz (121.9 kg)  Height: 5\' 7"  (1.702 m)     Physical Exam Vitals reviewed.  Constitutional:      Appearance: He is well-developed.  HENT:     Head: Normocephalic and atraumatic.  Eyes:     Extraocular Movements: Extraocular movements intact.     Conjunctiva/sclera: Conjunctivae normal.     Pupils: Pupils are equal, round, and reactive to light.     Comments: Lids normal, no nystagmus.  Neck:     Vascular: No carotid bruit or JVD.  Cardiovascular:     Rate and Rhythm: Normal rate and regular rhythm.     Heart sounds: Normal heart sounds. No murmur heard. Pulmonary:     Effort: Pulmonary effort is normal.     Breath sounds: Normal breath sounds. No rales.  Musculoskeletal:     Right lower leg: No edema.     Left lower leg: No edema.     Comments: Minimal discomfort of the paraspinals lower lumbar spine, full range of motion, ambulating without difficulty.  Equal lower extremity strength.  Skin:    General: Skin is warm and dry.  Neurological:     General: No focal deficit present.     Mental Status: He is alert and oriented to person, place, and time.     Motor: No weakness.     Coordination: Coordination normal.     Gait: Gait normal.     Comments: No focal weakness, equal facial movements.  Normal speech.  No pronator drift.  Psychiatric:        Mood and Affect: Mood normal.        Behavior: Behavior normal.       Assessment & Plan:  Jamarl Pew is a 60 y.o. male . History of itching of eye - Plan: azelastine (OPTIVAR) 0.05 % ophthalmic solution Eyelid abnormality - Plan: azelastine (OPTIVAR) 0.05 % ophthalmic solution  -Possible allergy because  of eyelid irritation and itching.  Trial of Optivar or over-the-counter Pataday/Zaditor.  If no improvement then ophthalmology evaluation.  Overall reassuring exam at present.  Dizziness - Plan: Ambulatory referral to Neurology, CT HEAD WO CONTRAST (5MM) Nonintractable episodic headache, unspecified headache type - Plan: Ambulatory referral to Neurology, CT HEAD WO CONTRAST (5MM)  -Persistent dizziness unchanged with increased fluid intake, reassuring CBC, no cardiac symptoms.  May have orthostatic component but also notices during work without orthostatic symptoms.  Will check CT head as new headaches although only a few times per week.  Nonfocal neurologic exam.  We will also refer to neurology to evaluate for other possible causes or work-up.  Continue hydration, stand up slowly.  Contusion of lower back, subsequent encounter  -Improving, reassuring imaging.  Hold on further treatment/imaging/PT at this time.  Consider PT if not continue to improve.  Vitamin D deficiency  -Mildly low, over-the-counter supplement discussed.  Has upcoming appointment for physical.   Meds ordered this encounter  Medications   azelastine (OPTIVAR) 0.05 % ophthalmic solution    Sig: Place 1 drop into both eyes 2 (two) times daily.    Dispense:  6 mL    Refill:  1   Patient Instructions  Try allergy drops first, but if itching or eyelid issues not improving then I recommend meeting with eye specialist. Let me know if you need a referral. Pataday  or Zaditor over the counter are other options over the counter if less costly.   Labs overall ok last visit. Vitamin D borderline low - over the counter 2000 units vitamin D supplement once per day may help.  I will refer you to neurology and order CT scan for headache and dizziness. Keep appointment for physical.  Return to the clinic or go to the nearest emergency room if any of your symptoms worsen or new symptoms occur.       Signed,   Merri Ray,  MD [place], [facility] 05/12/21 12:45 PM

## 2021-05-28 ENCOUNTER — Encounter: Payer: Self-pay | Admitting: Family Medicine

## 2021-05-28 ENCOUNTER — Ambulatory Visit (INDEPENDENT_AMBULATORY_CARE_PROVIDER_SITE_OTHER): Payer: BC Managed Care – PPO | Admitting: Family Medicine

## 2021-05-28 VITALS — BP 126/68 | HR 82 | Temp 98.3°F | Ht 67.0 in | Wt 266.4 lb

## 2021-05-28 DIAGNOSIS — G4733 Obstructive sleep apnea (adult) (pediatric): Secondary | ICD-10-CM | POA: Diagnosis not present

## 2021-05-28 DIAGNOSIS — R519 Headache, unspecified: Secondary | ICD-10-CM | POA: Diagnosis not present

## 2021-05-28 DIAGNOSIS — R3915 Urgency of urination: Secondary | ICD-10-CM

## 2021-05-28 DIAGNOSIS — Z13220 Encounter for screening for lipoid disorders: Secondary | ICD-10-CM

## 2021-05-28 DIAGNOSIS — Z0001 Encounter for general adult medical examination with abnormal findings: Secondary | ICD-10-CM | POA: Diagnosis not present

## 2021-05-28 DIAGNOSIS — R42 Dizziness and giddiness: Secondary | ICD-10-CM | POA: Diagnosis not present

## 2021-05-28 DIAGNOSIS — I428 Other cardiomyopathies: Secondary | ICD-10-CM

## 2021-05-28 DIAGNOSIS — Z0000 Encounter for general adult medical examination without abnormal findings: Secondary | ICD-10-CM

## 2021-05-28 DIAGNOSIS — N492 Inflammatory disorders of scrotum: Secondary | ICD-10-CM

## 2021-05-28 DIAGNOSIS — R5383 Other fatigue: Secondary | ICD-10-CM

## 2021-05-28 DIAGNOSIS — R0609 Other forms of dyspnea: Secondary | ICD-10-CM

## 2021-05-28 DIAGNOSIS — Z125 Encounter for screening for malignant neoplasm of prostate: Secondary | ICD-10-CM | POA: Diagnosis not present

## 2021-05-28 DIAGNOSIS — Z9989 Dependence on other enabling machines and devices: Secondary | ICD-10-CM

## 2021-05-28 MED ORDER — MUPIROCIN 2 % EX OINT: 1.0000 "application " | TOPICAL_OINTMENT | Freq: Two times a day (BID) | CUTANEOUS | 0 refills | Status: DC

## 2021-05-28 NOTE — Patient Instructions (Addendum)
If headaches resolved, can avoid ct scan for now.  I'm glad to the dizziness is better. Keep follow up with neurology as planned.  I will refer you to cardiology, but call sleep specialist to reschedule follow up as well.  Follow up in next few weeks to discuss fatigue and urinary symptoms further. Sooner if new symptoms or feeling depressed.  I do recommend updated covid booster.  I recommend visit with your eye doctor to check vision  Mupirocin ointment for possible infected hair gland on scrotum for next 5-7 days. Recheck if not improving.   Keeping you healthy  Get these tests Blood pressure- Have your blood pressure checked once a year by your healthcare provider.  Normal blood pressure is 120/80 Weight- Have your body mass index (BMI) calculated to screen for obesity.  BMI is a measure of body fat based on height and weight. You can also calculate your own BMI at ViewBanking.si. Cholesterol- Have your cholesterol checked every year. Diabetes- Have your blood sugar checked regularly if you have high blood pressure, high cholesterol, have a family history of diabetes or if you are overweight. Screening for Colon Cancer- Colonoscopy starting at age 37.  Screening may begin sooner depending on your family history and other health conditions. Follow up colonoscopy as directed by your Gastroenterologist. Screening for Prostate Cancer- Both blood work (PSA) and a rectal exam help screen for Prostate Cancer.  Screening begins at age 74 with African-American men and at age 64 with Caucasian men.  Screening may begin sooner depending on your family history.  Take these medicines Aspirin- One aspirin daily can help prevent Heart disease and Stroke. Flu shot- Every fall. Tetanus- Every 10 years. Zostavax- Once after the age of 12 to prevent Shingles. Pneumonia shot- Once after the age of 15; if you are younger than 9, ask your healthcare provider if you need a Pneumonia shot.  Take  these steps Don't smoke- If you do smoke, talk to your doctor about quitting.  For tips on how to quit, go to www.smokefree.gov or call 1-800-QUIT-NOW. Be physically active- Exercise 5 days a week for at least 30 minutes.  If you are not already physically active start slow and gradually work up to 30 minutes of moderate physical activity.  Examples of moderate activity include walking briskly, mowing the yard, dancing, swimming, bicycling, etc. Eat a healthy diet- Eat a variety of healthy food such as fruits, vegetables, low fat milk, low fat cheese, yogurt, lean meant, poultry, fish, beans, tofu, etc. For more information go to www.thenutritionsource.org Drink alcohol in moderation- Limit alcohol intake to less than two drinks a day. Never drink and drive. Dentist- Brush and floss twice daily; visit your dentist twice a year. Depression- Your emotional health is as important as your physical health. If you're feeling down, or losing interest in things you would normally enjoy please talk to your healthcare provider. Eye exam- Visit your eye doctor every year. Safe sex- If you may be exposed to a sexually transmitted infection, use a condom. Seat belts- Seat belts can save your life; always wear one. Smoke/Carbon Monoxide detectors- These detectors need to be installed on the appropriate level of your home.  Replace batteries at least once a year. Skin cancer- When out in the sun, cover up and use sunscreen 15 SPF or higher. Violence- If anyone is threatening you, please tell your healthcare provider. Living Will/ Health care power of attorney- Speak with your healthcare provider and family.

## 2021-05-28 NOTE — Progress Notes (Signed)
Subjective:  Patient ID: Nicholas Potts, male    DOB: 1960-07-06  Age: 60 y.o. MRN: [id number]  CC:  Chief Complaint  Patient presents with   Annual Exam    CPE, patient states that he still has dizzy spells, would like to discuss CT due to insurance not covering. Spot on left testicle noticed 2 days ago no pain.     HPI Nicholas Potts presents for   Annual exam with other concerns above.  Presents with daughter to interpret.   Dizziness Discussed in November again on December 5.  Reported dark stools but heme-negative stool and normal CBC initially.  Mildly low vitamin D but other labs were reassuring.  Orthostatic precautions and increased hydration discussed.  Nonfocal neuro exam last visit.  However he was having new headache experiencing 1-2 times per week.  Refer to neurology, and CT head ordered with new headaches, persistent dizziness. Dizziness has improved. No more headaches past 2 weeks.CT not covered.   Obesity with history of obstructive sleep apnea, hyperlipidemia: No current meds. CPAP nightly.feels rested.  Body mass index is 41.72 kg/m. Wt Readings from Last 3 Encounters:  05/28/21 266 lb 6.4 oz (120.8 kg)  05/12/21 268 lb 12.8 oz (121.9 kg)  04/24/21 266 lb 6.4 oz (120.8 kg)   Lab Results  Component Value Date   CHOL 214 (H) 05/20/2020   HDL 36 (L) 05/20/2020   LDLCALC 136 (H) 05/20/2020   TRIG 233 (H) 05/20/2020   CHOLHDL 5.9 (H) 05/20/2020   Lesion of left testicle Noticed 2 days ago. Small bump. No pain. No pus, bleeding, or redness.  Chronic urgency, small amts of urine for past year, some discomfort at times. Not new.  No new urinary symptoms.   Positive depression screening,fatigue dyspnea. : Sometimes feels decreased motivation, depression at times. Primarily fatigue.  Past month or so. No family or work changes, no new stressors.  Denies SI/HI. No prior depression treatment.  Feels tired at work - tired all the time. No chest pain.  Short of breath going up stairs past 3 months. Similar time of decreased energy. Hx of nonischemic cardiomyopathy.  Cardiologist Dr. Ellyn Hack.  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  Initial echo EF 25% at that time.  Cardiac cath with mild disease.  Echo in 2017 with EF 55 to 60% with mild LVH and grade 1 diastolic dysfunction.  Most recent echo October 2019.  EF 55 to 60% with normal wall motion, no regional wall motion abnormalities. On CPAP for OSA, met with sleep specialist in May.  74-monthfollow-up planned. Taking Vitamin D now.   [facility] 05/28/2021 05/28/2021 04/24/2021 05/20/2020 07/13/2019  Decreased Interest 1 1 0 0 0  Down, Depressed, Hopeless 1 1 0 0 0  PHQ - 2 Score 2 2 0 0 0  Altered sleeping 0 - - - -  Tired, decreased energy 3 - - - -  Change in appetite 1 - - - -  Feeling bad or failure about yourself  0 - - - -  Trouble concentrating 1 - - - -  Moving slowly or fidgety/restless 0 - - - -  Suicidal thoughts 0 - - - -  PHQ-9 Score 7 - - - -  Difficult doing work/chores Not difficult at all - - - -   Cancer screening Colonoscopy: February of this year The natural history of prostate cancer and ongoing controversy regarding screening  and potential treatment outcomes of prostate cancer has been discussed with the patient. The meaning of a false positive PSA and a false negative PSA has been discussed. He indicates understanding of the limitations of this screening test and wishes to proceed with screening PSA testing. No FH of prostate CA.   Lab Results  Component Value Date   PSA1 0.7 05/20/2020   PSA1 0.7 10/21/2017   PSA1 0.7 02/05/2017   PSA 0.82 06/29/2015     Immunization History  Administered Date(s) Administered   Influenza,inj,Quad PF,6+ Mos 06/29/2015, 02/22/2019   Influenza-Unspecified 04/22/2021   PFIZER(Purple Top)SARS-COV-2 Vaccination  08/16/2019, 09/06/2019, 04/23/2020   Tdap 10/21/2017  Flu vaccine 6 weeks ago.  Due for covid booster - recommended.  Shingles vaccine - recommended, deferred today.   No results found. Wears glasses at times to read- no recent optho eval. 2019.  Recommended.   Dental Every 6 months.   Alcohol 1-2 beers per week.   Tobacco None.   Exercise 4-5 times per week - stationary bike 3mn.   History Patient Active Problem List   Diagnosis Date Noted   History of colonic polyps    Polyp of ascending colon    Polyp of transverse colon    Polyp of cecum    Polyp of descending colon    Polyp of sigmoid colon    Hypersomnia 11/29/2017   Other headache syndrome 05/05/2017   Vertigo 05/05/2017   IFG (impaired fasting glucose) 08/21/2016   Obesity (BMI 30-39.9) 06/23/2013   Stress-induced cardiomyopathy -- essentially resolved    CVA (cerebral infarction)    OSA (obstructive sleep apnea)    Hyperlipidemia    Physical deconditioning 01/24/2013   Tinea pedis 01/18/2013   Essential hypertension 01/13/2013   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[facility] 01/11/2013   ARF (acute renal failure) with tubular necrosis requiring HD 01/11/2013   Anemia 01/11/2013   Thrombocytopenia, unspecified ([place] 01/11/2013   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 12/29/2012   Past Medical History:  Diagnosis Date   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[facility] 2014   post op    ARF (acute respiratory failure) (HL[place] 2014   Blood dyscrasia    Chronic kidney disease 29798  ARF    Complication of anesthesia 2014   - post op - in combination of pain pills-  Cardiac and Respiratory Arrest- prior to sleep apnea diagonosis   Constipation    CVA (cerebral infarction)    Hyperlipidemia    Hypertension    OSA (obstructive sleep apnea)    Pre-diabetes    Short-term memory loss    Sleep apnea    Stress-induced cardiomyopathy -- essentially resolved    Post-op Narcotic  induced Acute Hypoxic respiratory failure with respiratory and PEA cardiac arrest following accidental narcotic overdose; initial [place]20-20%, now improved to 55%   Was supported with Impella Cardiac Cath - non-obstructive CAD    Thrombocytopenia (HJette 2014   Past Surgical History:  Procedure Laterality Date   ACHILLES TENDON REPAIR     BIOPSY  08/01/2019   Procedure: BIOPSY;  Surgeon: NMauri Pole MD;  Location: [facility];  Service: Endoscopy;;   BIOPSY  08/05/2020   Procedure: BIOPSY;  Surgeon: NMauri Pole MD;  Location: [facility];  Service: Endoscopy;;   CARDIAC CATHETERIZATION  12/29/2012   RN LHC --> Impella. for severe shock with low EF. Mild to moderate RV pressure elevation. Normal coronary arteries. Cardiac Output 2.5, Index 1.46.  COLONOSCOPY WITH PROPOFOL N/A 10/30/2015   Procedure: COLONOSCOPY WITH PROPOFOL;  Surgeon: Mauri Pole, MD;  Location: [facility];  Service: Endoscopy;  Laterality: N/A;   COLONOSCOPY WITH PROPOFOL N/A 08/01/2019   Procedure: COLONOSCOPY WITH PROPOFOL;  Surgeon: Mauri Pole, MD;  Location: [facility];  Service: Endoscopy;  Laterality: N/A;   COLONOSCOPY WITH PROPOFOL N/A 08/05/2020   Procedure: COLONOSCOPY WITH PROPOFOL;  Surgeon: Mauri Pole, MD;  Location: [facility];  Service: Endoscopy;  Laterality: N/A;   INSERTION OF DIALYSIS CATHETER Right 01/09/2013   Procedure: INSERTION OF DIALYSIS CATHETER;  Surgeon: Angelia Mould, MD;  Location: [facility];  Service: Vascular;  Laterality: Right;   LEFT AND RIGHT HEART CATHETERIZATION WITH CORONARY ANGIOGRAM  12/29/2012   Procedure: LEFT AND RIGHT HEART CATHETERIZATION WITH CORONARY ANGIOGRAM;  Surgeon: Sherren Mocha, MD;  Location: [facility] CATH LAB;  Service: Cardiovascular;;   POLYPECTOMY  08/01/2019   Procedure: POLYPECTOMY;  Surgeon: Mauri Pole, MD;  Location: [facility];  Service: Endoscopy;;   POLYPECTOMY  08/05/2020   Procedure: POLYPECTOMY;  Surgeon:  Mauri Pole, MD;  Location: [facility];  Service: Endoscopy;;   TRANSTHORACIC ECHOCARDIOGRAM  12/29/2012   EF 25% with periapical HK/AKA.  --> Followup echo 7/25 -- EF of 35%   TRANSTHORACIC ECHOCARDIOGRAM  01/2013 ; 07/2015   a) EF 55-60%. No regional WMA. Grade 1 diastolic dysfunction. Mild LA dilatation.; b) Normal LV size with mild LV hypertrophy. EF 55-60%. Normal RV   Allergies  Allergen Reactions   Oxycodone     Oxygen levels drop   Prior to Admission medications   Medication Sig Start Date End Date Taking? Authorizing Provider  aspirin EC 81 MG tablet Take 1 tablet (81 mg total) by mouth daily. 05/13/17  Yes Almyra Deforest, PA  azelastine (OPTIVAR) 0.05 % ophthalmic solution Place 1 drop into both eyes 2 (two) times daily. 05/12/21  Yes [name], MD  cyclobenzaprine (FLEXERIL) 10 MG tablet Take 10 mg by mouth at bedtime. 05/10/21  Yes [provider]  meloxicam (MOBIC) 7.5 MG tablet Take 7.5 mg by mouth 2 (two) times daily. 05/10/21  Yes [provider]  Multiple Vitamin (MULTIVITAMIN WITH MINERALS) TABS tablet Take 1 tablet by mouth daily.   Yes [provider]   Social History   Socioeconomic History   Marital status: Married    Spouse name: Not on file   Number of children: Not on file   Years of education: Not on file   Highest education level: Not on file  Occupational History   Not on file  Tobacco Use   Smoking status: Former    Types: Cigarettes    Quit date: 06/20/1986    Years since quitting: 34.9   Smokeless tobacco: Never  Substance and Sexual Activity   Alcohol use: Yes    Comment: History of heavy use but only social at present.  10/29/15 - "a beer every week or so"   Drug use: No    Comment: Prior cocaine use but none since 2000   Sexual activity: Never  Other Topics Concern   Not on file  Social History Narrative   He is widowed for 13 years, and has 2 daughters: [name] & [address] [place], and one son Derren Suydam. the other history is daughter who is here with him today.    He works for [employer] as a [employer].   He quit smoking in  1988. He does drink alcohol socially, but he does have a history of heavy use in the past. He also has a prior history of cocaine use but none since 2000.   Social Determinants of Health   Financial Resource Strain: Not on file  Food Insecurity: Not on file  Transportation Needs: Not on file  Physical Activity: Not on file  Stress: Not on file  Social Connections: Not on file  Intimate Partner Violence: Not on file    Review of Systems 13 point review of systems per patient health survey noted.  Negative other than as indicated above or in HPI.    Objective:   Vitals:   05/28/21 1404  BP: 126/68  Pulse: 82  Temp: 98.3 F (36.8 C)  TempSrc: Temporal  SpO2: 96%  Weight: 266 lb 6.4 oz (120.8 kg)  Height: _0  (1.702 m)     Physical Exam Vitals reviewed.  Constitutional:      Appearance: He is well-developed.  HENT:     Head: Normocephalic and atraumatic.     Right Ear: External ear normal.     Left Ear: External ear normal.  Eyes:     Conjunctiva/sclera: Conjunctivae normal.     Pupils: Pupils are equal, round, and reactive to light.  Neck:     Thyroid: No thyromegaly.  Cardiovascular:     Rate and Rhythm: Normal rate and regular rhythm.     Heart sounds: Normal heart sounds.  Pulmonary:     Effort: Pulmonary effort is normal. No respiratory distress.     Breath sounds: Normal breath sounds. No wheezing.  Abdominal:     General: There is no distension.     Palpations: Abdomen is soft.     Tenderness: There is no abdominal tenderness.  Genitourinary:    Penis: Normal.      Testes: Normal.     Comments: Left scrotum with small crusted papule, minimal induration approximately 2 to 3 mm across.  no  Erythema.  No fluctuance. Musculoskeletal:        General: No tenderness. Normal range of  motion.     Cervical back: Normal range of motion and neck supple.  Lymphadenopathy:     Cervical: No cervical adenopathy.  Skin:    General: Skin is warm and dry.  Neurological:     Mental Status: He is alert and oriented to person, place, and time.     Deep Tendon Reflexes: Reflexes are normal and symmetric.  Psychiatric:        Behavior: Behavior normal.       Assessment & Plan:  Daquon Greenleaf is a 60 y.o. male . Annual physical exam  - -anticipatory guidance as below in AVS, screening labs above. Health maintenance items as above in HPI discussed/recommended as applicable.   Dizziness Nonintractable episodic headache, unspecified headache type  -Improved.  Continue hydration, RTC precautions if recurs  OSA on CPAP  -Reports stable control symptoms, continue CPAP  DOE (dyspnea on exertion) - Plan: Ambulatory referral to Cardiology Fatigue, unspecified type - Plan: Ambulatory referral to Cardiology Nonischemic cardiomyopathy [facility]) - Plan: Ambulatory referral to Cardiology  -Dyspnea on exertion as above, past few months.  May be contributor to fatigue, less likely depression.  History of nonischemic cardiomyopathy.  Will refer back to his cardiologist to decide on further testing.  ER/RTC precautions if acute change in symptoms or worsening.  We will recheck fatigue at follow-up in the next few weeks.  Scrotal infection - Plan: mupirocin  ointment (BACTROBAN) 2 %  -Possible small folliculitis area, will treat with mupirocin, wound care, RTC precautions.  Urinary urgency - Plan: PSA, POCT urinalysis dipstick  -Longstanding symptoms past year, will check PSA, urinalysis ordered.  Recheck next few weeks.  RTC precautions if acute changes.  Screening for malignant neoplasm of prostate - Plan: PSA  Screening for hyperlipidemia - Plan: Lipid panel   Meds ordered this encounter  Medications   mupirocin ointment (BACTROBAN) 2 %    Sig: Apply 1 application topically 2 (two)  times daily. Scrotal lesion 5-7 days.    Dispense:  22 g    Refill:  0   Patient Instructions  If headaches resolved, can avoid ct scan for now.  I'm glad to the dizziness is better. Keep follow up with neurology as planned.  I will refer you to cardiology, but call sleep specialist to reschedule follow up as well.  Follow up in next few weeks to discuss fatigue and urinary symptoms further. Sooner if new symptoms or feeling depressed.  I do recommend updated covid booster.  I recommend visit with your eye doctor to check vision  Mupirocin ointment for possible infected hair gland on scrotum for next 5-7 days. Recheck if not improving.   Keeping you healthy  Get these tests Blood pressure- Have your blood pressure checked once a year by your healthcare provider.  Normal blood pressure is 120/80 Weight- Have your body mass index (BMI) calculated to screen for obesity.  BMI is a measure of body fat based on height and weight. You can also calculate your own BMI at ViewBanking.si. Cholesterol- Have your cholesterol checked every year. Diabetes- Have your blood sugar checked regularly if you have high blood pressure, high cholesterol, have a family history of diabetes or if you are overweight. Screening for Colon Cancer- Colonoscopy starting at age 71.  Screening may begin sooner depending on your family history and other health conditions. Follow up colonoscopy as directed by your Gastroenterologist. Screening for Prostate Cancer- Both blood work (PSA) and a rectal exam help screen for Prostate Cancer.  Screening begins at age 59 with African-American men and at age 71 with Caucasian men.  Screening may begin sooner depending on your family history.  Take these medicines Aspirin- One aspirin daily can help prevent Heart disease and Stroke. Flu shot- Every fall. Tetanus- Every 10 years. Zostavax- Once after the age of 17 to prevent Shingles. Pneumonia shot- Once after the age of 59;  if you are younger than 38, ask your healthcare provider if you need a Pneumonia shot.  Take these steps Don't smoke- If you do smoke, talk to your doctor about quitting.  For tips on how to quit, go to www.smokefree.gov or call 1-800-QUIT-NOW. Be physically active- Exercise 5 days a week for at least 30 minutes.  If you are not already physically active start slow and gradually work up to 30 minutes of moderate physical activity.  Examples of moderate activity include walking briskly, mowing the yard, dancing, swimming, bicycling, etc. Eat a healthy diet- Eat a variety of healthy food such as fruits, vegetables, low fat milk, low fat cheese, yogurt, lean meant, poultry, fish, beans, tofu, etc. For more information go to www.thenutritionsource.org Drink alcohol in moderation- Limit alcohol intake to less than two drinks a day. Never drink and drive. Dentist- Brush and floss twice daily; visit your dentist twice a year. Depression- Your emotional health is as important as your physical health. If you're feeling down, or losing  interest in things you would normally enjoy please talk to your healthcare provider. Eye exam- Visit your eye doctor every year. Safe sex- If you may be exposed to a sexually transmitted infection, use a condom. Seat belts- Seat belts can save your life; always wear one. Smoke/Carbon Monoxide detectors- These detectors need to be installed on the appropriate level of your home.  Replace batteries at least once a year. Skin cancer- When out in the sun, cover up and use sunscreen 15 SPF or higher. Violence- If anyone is threatening you, please tell your healthcare provider. Living Will/ Health care power of attorney- Speak with your healthcare provider and family.    Signed,   Merri Ray, MD [place], [facility] 05/28/21 3:41 PM

## 2021-05-29 LAB — LIPID PANEL
Cholesterol: 247 mg/dL — ABNORMAL HIGH (ref 0–200)
HDL: 35 mg/dL — ABNORMAL LOW (ref >39.00)
NonHDL: 211.88
Total CHOL/HDL Ratio: 7
Triglycerides: 307 mg/dL — ABNORMAL HIGH (ref 0.0–149.0)
VLDL: 61.4 mg/dL — ABNORMAL HIGH (ref 0.0–40.0)

## 2021-05-29 LAB — LDL CHOLESTEROL, DIRECT: Direct LDL: 185 mg/dL

## 2021-05-29 LAB — PSA: PSA: 0.73 ng/mL (ref 0.10–4.00)

## 2021-06-16 ENCOUNTER — Ambulatory Visit: Payer: BC Managed Care – PPO | Admitting: Family Medicine

## 2021-06-16 VITALS — BP 126/78 | HR 76 | Temp 98.2°F | Resp 16 | Ht 67.0 in | Wt 268.2 lb

## 2021-06-16 DIAGNOSIS — E785 Hyperlipidemia, unspecified: Secondary | ICD-10-CM | POA: Diagnosis not present

## 2021-06-16 DIAGNOSIS — R5383 Other fatigue: Secondary | ICD-10-CM

## 2021-06-16 DIAGNOSIS — R3915 Urgency of urination: Secondary | ICD-10-CM | POA: Diagnosis not present

## 2021-06-16 LAB — POCT URINALYSIS DIP (MANUAL ENTRY)
Bilirubin, UA: NEGATIVE
Blood, UA: NEGATIVE
Glucose, UA: NEGATIVE mg/dL
Ketones, POC UA: NEGATIVE mg/dL
Leukocytes, UA: NEGATIVE
Nitrite, UA: NEGATIVE
Protein Ur, POC: NEGATIVE mg/dL
Spec Grav, UA: 1.02 (ref 1.010–1.025)
Urobilinogen, UA: 0.2 E.U./dL
pH, UA: 7 (ref 5.0–8.0)

## 2021-06-16 LAB — URINALYSIS, MICROSCOPIC ONLY
RBC / HPF: NONE SEEN (ref 0–?)
WBC, UA: NONE SEEN (ref 0–?)

## 2021-06-16 MED ORDER — ATORVASTATIN CALCIUM 10 MG PO TABS: 10.0000 mg | ORAL_TABLET | Freq: Every day | ORAL | 0 refills | Status: DC

## 2021-06-16 NOTE — Patient Instructions (Addendum)
Call Dr. Allison Quarry office and sleep specialist to follow up for fatigue.  Return to the clinic or go to the nearest emergency room if any of your symptoms worsen or new symptoms occur.  I will check urine tests. If worse symptoms - return right away.   Start cholesterol med once per day. ( Can start a few times per week initially if you would prefer and if tolerated take daily). Recheck in 6 weeks, sooner if new symptoms or side effects)

## 2021-06-16 NOTE — Progress Notes (Signed)
Subjective:  Patient ID: Nicholas Potts, male    DOB: December 15, 1960  Age: 61 y.o. MRN: [id number]  CC:  Chief Complaint  Patient presents with   Fatigue    Pt reports fatigue is about the same, no meaningful changes, has not made appt with cardiology at this time    Urinary Urgency    Pt reports no meaningful improvement    Hyperlipidemia    Pt reports has not reviewed lab work on [employer], would like to cover today     HPI Nicholas Potts presents for   Here with interpreter.   Fatigue with dyspnea on exertion Discussed at his physical in December.  History of nonischemic cardiomyopathy, plan for cardiology follow-up. On CPAP with OSA. ? Adderall discussed by sleep specialist , did not want take. New machine last year - 6 month f/u planned.  Has not yet made appointment with cardiology. No chest pain, swelling or dyspnea. Same fatigue for months.    Lab Results  Component Value Date   WBC 8.4 04/24/2021   HGB 15.0 04/24/2021   HCT 45.3 04/24/2021   MCV 98.3 04/24/2021   PLT 259.0 04/24/2021    Urinary urgency Discussed in December, symptoms past year.  some dribbling at times, normal stream, leakage, urgency at times. No new abd pain, new back pain, fever. No hematuria.  PSA 0.73 on 05/28/21.  No penile d/c, no hx of STI or new sexual contacts.  Lab Results  Component Value Date   PSA1 0.7 05/20/2020   PSA1 0.7 10/21/2017   PSA1 0.7 02/05/2017   PSA 0.73 05/28/2021   PSA 0.82 06/29/2015   Hyperlipidemia: Elevated in December.  ASCVD risk of 14.7%.  Statin recommended. Lab Results  Component Value Date   CHOL 247 (H) 05/28/2021   HDL 35.00 (L) 05/28/2021   LDLCALC 136 (H) 05/20/2020   LDLDIRECT 185.0 05/28/2021   TRIG 307.0 (H) 05/28/2021   CHOLHDL 7 05/28/2021   Lab Results  Component Value Date   ALT 19 04/24/2021   AST 19 04/24/2021   ALKPHOS 83 04/24/2021   BILITOT 0.4 04/24/2021      History Patient Active Problem List   Diagnosis Date  Noted   History of colonic polyps    Polyp of ascending colon    Polyp of transverse colon    Polyp of cecum    Polyp of descending colon    Polyp of sigmoid colon    Hypersomnia 11/29/2017   Other headache syndrome 05/05/2017   Vertigo 05/05/2017   IFG (impaired fasting glucose) 08/21/2016   Obesity (BMI 30-39.9) 06/23/2013   Stress-induced cardiomyopathy -- essentially resolved    CVA (cerebral infarction)    OSA (obstructive sleep apnea)    Hyperlipidemia    Physical deconditioning 01/24/2013   Tinea pedis 01/18/2013   Essential hypertension 01/13/2013   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[place]) 01/11/2013   ARF (acute renal failure) with tubular necrosis requiring HD 01/11/2013   Anemia 01/11/2013   Thrombocytopenia, unspecified ([place]) 01/11/2013   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 12/29/2012   Past Medical History:  Diagnosis Date   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[place]) 2014   post op    ARF (acute respiratory failure) ([place]) 2014   Blood dyscrasia    Chronic kidney disease 2353   ARF    Complication of anesthesia 2014   - post op - in combination of pain pills-  Cardiac and Respiratory Arrest- prior to sleep apnea  diagonosis   Constipation    CVA (cerebral infarction)    Hyperlipidemia    Hypertension    OSA (obstructive sleep apnea)    Pre-diabetes    Short-term memory loss    Sleep apnea    Stress-induced cardiomyopathy -- essentially resolved    Post-op Narcotic induced Acute Hypoxic respiratory failure with respiratory and PEA cardiac arrest following accidental narcotic overdose; initial [place] 20-20%, now improved to 55%   Was supported with Impella Cardiac Cath - non-obstructive CAD    Thrombocytopenia ([place]) 2014   Past Surgical History:  Procedure Laterality Date   ACHILLES TENDON REPAIR     BIOPSY  08/01/2019   Procedure: BIOPSY;  Surgeon: Mauri Pole, MD;  Location: [facility];  Service: Endoscopy;;    BIOPSY  08/05/2020   Procedure: BIOPSY;  Surgeon: Mauri Pole, MD;  Location: [facility];  Service: Endoscopy;;   CARDIAC CATHETERIZATION  12/29/2012   RN LHC --> Impella. for severe shock with low EF. Mild to moderate RV pressure elevation. Normal coronary arteries. Cardiac Output 2.5, Index 1.46.   COLONOSCOPY WITH PROPOFOL N/A 10/30/2015   Procedure: COLONOSCOPY WITH PROPOFOL;  Surgeon: Mauri Pole, MD;  Location: [facility];  Service: Endoscopy;  Laterality: N/A;   COLONOSCOPY WITH PROPOFOL N/A 08/01/2019   Procedure: COLONOSCOPY WITH PROPOFOL;  Surgeon: Mauri Pole, MD;  Location: [facility];  Service: Endoscopy;  Laterality: N/A;   COLONOSCOPY WITH PROPOFOL N/A 08/05/2020   Procedure: COLONOSCOPY WITH PROPOFOL;  Surgeon: Mauri Pole, MD;  Location: [facility];  Service: Endoscopy;  Laterality: N/A;   INSERTION OF DIALYSIS CATHETER Right 01/09/2013   Procedure: INSERTION OF DIALYSIS CATHETER;  Surgeon: Angelia Mould, MD;  Location: [facility];  Service: Vascular;  Laterality: Right;   LEFT AND RIGHT HEART CATHETERIZATION WITH CORONARY ANGIOGRAM  12/29/2012   Procedure: LEFT AND RIGHT HEART CATHETERIZATION WITH CORONARY ANGIOGRAM;  Surgeon: Sherren Mocha, MD;  [facility];  Service: Cardiovascular;;   POLYPECTOMY  08/01/2019   Procedure: POLYPECTOMY;  Surgeon: Mauri Pole, MD;  Location: [facility];  Service: Endoscopy;;   POLYPECTOMY  08/05/2020   Procedure: POLYPECTOMY;  Surgeon: Mauri Pole, MD;  Location: [facility];  Service: Endoscopy;;   TRANSTHORACIC ECHOCARDIOGRAM  12/29/2012   EF 25% with periapical HK/AKA.  --> Followup echo 7/25 -- EF of 35%   TRANSTHORACIC ECHOCARDIOGRAM  01/2013 ; 07/2015   a) EF 55-60%. No regional WMA. Grade 1 diastolic dysfunction. Mild LA dilatation.; b) Normal LV size with mild LV hypertrophy. EF 55-60%. Normal RV   Allergies  Allergen Reactions   Oxycodone     Oxygen levels drop   Prior  to Admission medications   Medication Sig Start Date End Date Taking? Authorizing Provider  aspirin EC 81 MG tablet Take 1 tablet (81 mg total) by mouth daily. 05/13/17  Yes Almyra Deforest, PA  azelastine (OPTIVAR) 0.05 % ophthalmic solution Place 1 drop into both eyes 2 (two) times daily. 05/12/21  Yes [name], MD  cyclobenzaprine (FLEXERIL) 10 MG tablet Take 10 mg by mouth at bedtime. 05/10/21  Yes [provider]  meloxicam (MOBIC) 7.5 MG tablet Take 7.5 mg by mouth 2 (two) times daily. 05/10/21  Yes [provider]  Multiple Vitamin (MULTIVITAMIN WITH MINERALS) TABS tablet Take 1 tablet by mouth daily.   Yes [provider]  mupirocin ointment (BACTROBAN) 2 % Apply 1 application topically 2 (two) times daily. Scrotal lesion 5-7 days. 05/28/21  Yes  [name], MD   Social History   Socioeconomic History   Marital status: Married    Spouse name: Not on file   Number of children: Not on file   Years of education: Not on file   Highest education level: Not on file  Occupational History   Not on file  Tobacco Use   Smoking status: Former    Types: Cigarettes    Quit date: 06/20/1986    Years since quitting: 35.0   Smokeless tobacco: Never  Substance and Sexual Activity   Alcohol use: Yes    Comment: History of heavy use but only social at present.  10/29/15 - "a beer every week or so"   Drug use: No    Comment: Prior cocaine use but none since 2000   Sexual activity: Never  Other Topics Concern   Not on file  Social History Narrative   He is widowed for 13 years, and has 2 daughters: [name] & [address] [place], and one son Zaxton Angerer. the other history is daughter who is here with him today.    He works for [employer] as a [employer].   He quit smoking in 1988. He does drink alcohol socially, but he does have a history of heavy use in the past. He also has a prior history of cocaine use but none  since 2000.   Social Determinants of Health   Financial Resource Strain: Not on file  Food Insecurity: Not on file  Transportation Needs: Not on file  Physical Activity: Not on file  Stress: Not on file  Social Connections: Not on file  Intimate Partner Violence: Not on file    Review of Systems Per HPI.   Objective:   Vitals:   06/16/21 0916  BP: 126/78  Pulse: 76  Resp: 16  Temp: 98.2 F (36.8 C)  TempSrc: Temporal  SpO2: 96%  Weight: 268 lb 3.2 oz (121.7 kg)  Height: 5\' 7"  (1.702 m)     Physical Exam Vitals reviewed.  Constitutional:      Appearance: He is well-developed. He is obese.  HENT:     Head: Normocephalic and atraumatic.  Neck:     Vascular: No carotid bruit or JVD.  Cardiovascular:     Rate and Rhythm: Normal rate and regular rhythm.     Heart sounds: Normal heart sounds. No murmur heard. Pulmonary:     Effort: Pulmonary effort is normal.     Breath sounds: Normal breath sounds. No rales.  Abdominal:     General: There is no distension.     Tenderness: There is no abdominal tenderness. There is no right CVA tenderness or left CVA tenderness.  Musculoskeletal:     Right lower leg: No edema.     Left lower leg: No edema.  Skin:    General: Skin is warm and dry.  Neurological:     Mental Status: He is alert and oriented to person, place, and time.  Psychiatric:        Mood and Affect: Mood normal.       Assessment & Plan:  Nicholas Potts is a 61 y.o. male . Urinary urgency - Plan: POCT urinalysis dipstick, Urine Microscopic  - PSA reassuring. Check urinalysis, consider trial of abx vs flomax depending on results. Rtc precautions.   Fatigue, unspecified type  -Longstanding for family member.  May be multifactorial.  Due for sleep specialist follow-up as well as plan for  cardiology follow-up.  Denies change in symptoms since last visit.  He will schedule those appointments, RTC/ER precautions given.  Hyperlipidemia, unspecified  hyperlipidemia type - Plan: atorvastatin (LIPITOR) 10 MG tablet  -Potential side effects/risks of meds discussed.  Start Lipitor once per day with recheck levels in 6 weeks, RTC precautions.  Meds ordered this encounter  Medications   atorvastatin (LIPITOR) 10 MG tablet    Sig: Take 1 tablet (10 mg total) by mouth daily.    Dispense:  90 tablet    Refill:  0   Patient Instructions  Call Dr. Allison Quarry office and sleep specialist to follow up for fatigue.  Return to the clinic or go to the nearest emergency room if any of your symptoms worsen or new symptoms occur.  I will check urine tests. If worse symptoms - return right away.   Start cholesterol med once per day. ( Can start a few times per week initially if you would prefer and if tolerated take daily). Recheck in 6 weeks, sooner if new symptoms or side effects)         Signed,   Merri Ray, MD [place], [facility] 06/16/21 9:48 AM

## 2021-06-17 ENCOUNTER — Encounter: Payer: Self-pay | Admitting: Family Medicine

## 2021-07-23 ENCOUNTER — Ambulatory Visit: Payer: BC Managed Care – PPO | Admitting: Cardiology

## 2021-07-30 ENCOUNTER — Ambulatory Visit: Payer: BC Managed Care – PPO | Admitting: Family Medicine

## 2021-08-20 ENCOUNTER — Ambulatory Visit: Payer: BC Managed Care – PPO | Admitting: Family Medicine

## 2021-09-10 ENCOUNTER — Ambulatory Visit: Payer: BC Managed Care – PPO | Admitting: Cardiology

## 2021-09-10 ENCOUNTER — Encounter: Payer: Self-pay | Admitting: Cardiology

## 2021-09-10 VITALS — BP 122/62 | HR 78 | Ht 67.0 in | Wt 265.6 lb

## 2021-09-10 DIAGNOSIS — R5383 Other fatigue: Secondary | ICD-10-CM

## 2021-09-10 DIAGNOSIS — I5181 Takotsubo syndrome: Secondary | ICD-10-CM

## 2021-09-10 DIAGNOSIS — R5381 Other malaise: Secondary | ICD-10-CM

## 2021-09-10 DIAGNOSIS — I10 Essential (primary) hypertension: Secondary | ICD-10-CM | POA: Diagnosis not present

## 2021-09-10 DIAGNOSIS — E782 Mixed hyperlipidemia: Secondary | ICD-10-CM

## 2021-09-10 DIAGNOSIS — G4733 Obstructive sleep apnea (adult) (pediatric): Secondary | ICD-10-CM

## 2021-09-10 DIAGNOSIS — Z6841 Body Mass Index (BMI) 40.0 and over, adult: Secondary | ICD-10-CM

## 2021-09-10 NOTE — Patient Instructions (Signed)
Medication Instructions:  ? ?No changes  ? ?*If you need a refill on your cardiac medications before your next appointment, please call your pharmacy* ? ? ?Lab Work: ? ?Not needed  ? ? ?Testing/Procedures: ?Will be schedule at [address] ?Your physician has requested that you have an echocardiogram. Echocardiography is a painless test that uses sound waves to create images of your heart. It provides your doctor with information about the size and shape of your heart and how well your heart?s chambers and valves are working. This procedure takes approximately one hour. There are no restrictions for this procedure.  ? ? ?Follow-Up: ?At [facility], you and your health needs are our priority.  As part of our continuing mission to provide you with exceptional heart care, we have created designated Provider Care Teams.  These Care Teams include your primary Cardiologist (physician) and Advanced Practice Providers (APPs -  Physician Assistants and Nurse Practitioners) who all work together to provide you with the care you need, when you need it. ? ?  ? ?Your next appointment:   ?4 month(s) ? ?The format for your next appointment:   ?In Person ? ?Provider:   ?Glenetta Hew, MD  ? ? ? ?

## 2021-09-10 NOTE — Progress Notes (Signed)
? ? ?Primary Care Provider: [name], MD ?Cardiologist: Glenetta Hew, MD ?Electrophysiologist: None ? ?Clinic Note: ?Chief Complaint  ?Patient presents with  ? New Patient (Initial Visit)  ?  - 3 1/2 yr f/u - last seen in Oct 2019 - for Amor[place],  ?H/o Takotsubo CM 0 resolved  ? ?=================================== ? ?ASSESSMENT/PLAN  ? ?Problem List Items Addressed This Visit   ? ?  ? Cardiology Problems  ? Essential hypertension (Chronic)  ?  No longer truly a diagnosis.  His blood pressure looks great without medications. ? ?Without having significant fatigue, I would be reluctant to treat any further. ?  ?  ? Stress-induced cardiomyopathy -- essentially resolved (Chronic)  ?  Essentially resolved 1 month after the initial event.  Has now had echocardiograms done on several occasions and follow-up with most recently in October 2019 showed preserved EF with no wall motion abnormality. ? ?With him having recurrent fatigue ; the only cardiac evaluation medical management is to be assess his EF.  Is difficult to tell but he has had fatigue since I met him after his event. ? ?He is not having any active cardiac symptoms of any suspect that he is having any CHF or angina. ? ?Plan: Check 2D echo ?  ?  ? Relevant Orders  ? ECHOCARDIOGRAM COMPLETE  ? Hyperlipidemia (Chronic)  ?  Very poorly controlled.  He is now on low-dose statin.  I doubt that this amount of statin is causing his level of fatigue. ? ?Would like to see LDL levels at least below 100 given his other risk factors, but had no significant CAD back in 2014. ?  ?  ?  ? Other  ? OSA (obstructive sleep apnea) (Chronic)  ?  Says he is using CPAP, but does not wake up feeling rested. ? ?May need to have his CPAP reassessed ?  ?  ? Morbid obesity with BMI of 40.0-44.9, adult ([place])  ? Relevant Orders  ? ECHOCARDIOGRAM COMPLETE  ? Fatigue - Primary  ?  Hard to tell what this diagnosis is caused by.  He is not really on any medications that would do it.   I  think atorvastatin is relatively new. ? ?We will check a 2D echocardiogram, but I think a lot of this is related to obesity, deconditioning, persistent OSA and anhedonia. ? ?Fatigue seems irritated back all the way to 2015 and probably has been ongoing ever since his hospitalization.  It took over a year for him to get back into some symptoms of her lifestyle where he would go to work. ?  ?  ? Relevant Orders  ? ECHOCARDIOGRAM COMPLETE  ? Physical deconditioning (Chronic)  ?  He remains out of shape, and morbidly obese.  Has not changed his weight at all.  He does not do any exercise.  He says he does not sleep well and is always fatigued.  He uses CPAP but still feels tired.  I really think he is sedentary and I think it is a highly likely that this fatigue symptom he is noting is due to lack of motivation. ? ?We are checking 2D echocardiogram to reassess his dyspnea on exertion, I think is more because of his obesity and lack of activity. ?  ?  ? Relevant Orders  ? ECHOCARDIOGRAM COMPLETE  ? ?=================================== ? ?HPI:   ? ?Tighe Gitto is a 61 y.o. male with a PMH notable for TAKOTSUBO (STRESS-INDUCED) CARDIOMYOPATHYSecondary to ACUTE RESPIRATORY ARREST (unintentional narcotic overdose with  pain pills after ankle surgery) who presents today for  the evaluation of chronic fatigue at the request of [name], MD. ? ?Prior CV History:  ?12/2012: Achilles Tendon Sgx +. D/c home on pain medications = found unresponsive - agonal breathing => Respiratory followed by Cardiac Arrest x 3 (PEA then VTach). => Bedside Echo ~ EF 25%.  ?12/29/2012 TTE (in setting of cardiogenic shock/cardiac arrest): EF~25%.  Global HK with periapical akinesis. ?R&LHC - mild, nonobstructive CAD w/ slow flow; Severe C-Shock; PCWP 30, PAP mean 38, RAP 25 ?Post Impella: EF 35%. ?[facility] stay including short Term HD ?CM thought to be Stress-Induced-Takotsubo CM (from Acute Respiratory Arrest). ?TTE 01/11/2013: EF 55  to 60%.  No RWMA.  GR 1 DD. ?--> Never had Cardiology f/u post-d/c ?Seen in Jan 2015: still noted fatigue (deconditioned).  ?08/2015 TTE: EF 55 to 60%.  Mild LVH.  No RWMA.  GR 1 DD.  No valvular abnormalities. ? ?I last saw Mrk Buzby in October 2019 -> he was doing well.  Did still have some mild aftereffects of the hospitalization and his cardiac arrest.  Some memory loss and some paresthesias in the left leg.  Also noted difficulty sleeping.  CPAP was helping some but not always. ==> Again noted progressive fatigue was referred back to cardiology for assessment of fatigue.  Pulmonary medicine suggested Adderall to help with his effects but he declined. => No cardiac symptoms of CHF or angina.  No arrhythmias.  Echo ordered. ?03/2018 TTE: EF 55-60%.  NO RWMA  ? ?Sipriano Fendley was seen by Dr. Carlota Raspberry on June 16, 2021 for follow-up after his annual visit in December 2022 (this visit dizziness and fatigue as well as new headache.  Also noted decreased motivation and depression symptoms.  Feeling tired all the time.).  He was noting persistent fatigue.  There is concern of exertional dyspnea as well.  Still using CPAP for OSA but is noting fatigue.  Recommended cardiology follow-up. ? ?Recent Hospitalizations: None ? ?Reviewed  CV studies:   ? ?The following studies were reviewed today: (if available, images/films reviewed: From Epic Chart or Care Everywhere) ?No new studies ? ?History assisted by the use of contracted extremities. ? ?Interval History:  ? ?Earley Grobe presents here today yet again to follow-up fatigue.  He says he uses CPAP but still feels fatigued.  He does not have any energy.  Feels tired all the time although today.  However he denies any cardiac symptoms of PND, orthopnea or edema.  No chest pain or pressure at rest exertion.  No irregular heartbeats palpitations.  Nothing to suggest an arrhythmia.  No syncope or near syncope. ? ?In fact, he really is not having any pulmonary  issues either.  He still has some memory loss issues and remains morbidly obese.  Not active does not do any exercise. ? ?CV Review of Symptoms (Summary) ?Cardiovascular ROS: no chest pain or dyspnea on exertion ?positive for - -fatigue and exercise intolerance; remains sedentary ?negative for - edema, irregular heartbeat, orthopnea, palpitations, paroxysmal nocturnal dyspnea, rapid heart rate, shortness of breath, or syncope or near syncope, TIA or amaurosis fugax, claudication ? ?REVIEWED OF SYSTEMS  ? ?Review of Systems  ?Constitutional:  Positive for malaise/fatigue. Negative for weight loss.  ?HENT:  Negative for congestion and nosebleeds.   ?Respiratory:  Negative for cough and shortness of breath.   ?Cardiovascular:   ?     Per HPI  ?Gastrointestinal:  Negative for blood in stool, diarrhea  and melena.  ?Genitourinary:  Negative for hematuria.  ?Musculoskeletal:  Positive for joint pain. Negative for back pain and falls.  ?Neurological:  Negative for dizziness, speech change, focal weakness and weakness.  ?Psychiatric/Behavioral:  Positive for depression (Does have anhedonia.  Poor sleep habits.  Lack of dietary discretion) and memory loss (No more significant memory loss since his incident.). The patient has insomnia (Not really insomnia just does not feel like he sleeps well.). The patient is not nervous/anxious.   ? ?I have reviewed and (if needed) personally updated the patient's problem list, medications, allergies, past medical and surgical history, social and family history.  ? ?PAST MEDICAL HISTORY  ? ?Past Medical History:  ?Diagnosis Date  ? 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[place]) 2014  ? post op   ? ARF (acute respiratory failure) ([place]) 2014  ? Blood dyscrasia   ? Chronic kidney disease 2014  ? ARF   ? Complication of anesthesia 2014  ? - post op - in combination of pain pills-  Cardiac and Respiratory Arrest- prior to sleep apnea diagonosis  ? Constipation   ? CVA (cerebral infarction)   ? Hyperlipidemia    ? Hypertension   ? OSA (obstructive sleep apnea)   ? Pre-diabetes   ? Short-term memory loss   ? Sleep apnea   ? Stress-induced cardiomyopathy -- RESOLVED   ? Post-op Narcotic induced Acute Hypoxic r

## 2021-09-11 ENCOUNTER — Other Ambulatory Visit: Payer: Self-pay | Admitting: Family Medicine

## 2021-09-11 DIAGNOSIS — E785 Hyperlipidemia, unspecified: Secondary | ICD-10-CM

## 2021-09-15 ENCOUNTER — Encounter: Payer: Self-pay | Admitting: Cardiology

## 2021-09-15 NOTE — Assessment & Plan Note (Signed)
Essentially resolved 1 month after the initial event.  Has now had echocardiograms done on several occasions and follow-up with most recently in October 2019 showed preserved EF with no wall motion abnormality. ? ?With him having recurrent fatigue ; the only cardiac evaluation medical management is to be assess his EF.  Is difficult to tell but he has had fatigue since I met him after his event. ? ?He is not having any active cardiac symptoms of any suspect that he is having any CHF or angina. ? ?Plan: Check 2D echo ?

## 2021-09-15 NOTE — Assessment & Plan Note (Signed)
No longer truly a diagnosis.  His blood pressure looks great without medications. ? ?Without having significant fatigue, I would be reluctant to treat any further. ?

## 2021-09-15 NOTE — Assessment & Plan Note (Signed)
Says he is using CPAP, but does not wake up feeling rested. ? ?May need to have his CPAP reassessed ?

## 2021-09-15 NOTE — Assessment & Plan Note (Signed)
Hard to tell what this diagnosis is caused by.  He is not really on any medications that would do it.   I think atorvastatin is relatively new. ? ?We will check a 2D echocardiogram, but I think a lot of this is related to obesity, deconditioning, persistent OSA and anhedonia. ? ?Fatigue seems irritated back all the way to 2015 and probably has been ongoing ever since his hospitalization.  It took over a year for him to get back into some symptoms of her lifestyle where he would go to work. ?

## 2021-09-15 NOTE — Progress Notes (Signed)
11/25/2017-61 year old male former smoker, widower,  for sleep evaluation. ?-=----On cpap for 5 years  now. Had sleep study done with [facility].Feels he does not have enough pressure. DME is [facility]. ?NPSG 02/20/13  AHI 79.7/ hr, body weight 240 lbs ?CPAP auto 5-20/ [facility] ?Today's weight 257 lbs ?Medical problem list includes HBP, history of acute respiratory failure after accidental narcotic overdose, obesity, MI/CHF. ?Download 100% compliance, AHI 0.8/ hr, auto 5-20. ?Daughter here as Optometrist.  He works day shift job Education officer, community.  Asleep at TV after work and dozes off easily if sitting quietly but he thinks he sleeps without interruption at night except nocturia x2 or 3 short return to sleep.  He is concerned that he feels sleepier during the daytime than he should, but he has difficulty distinguishing between sleepy and weak. ?Epworth score 4 ?Remote hx cocaine use. Little caffeine now.  ? ?09/16/21- 59 yoM former smoker last seen in 2019, coming for re-evaluation ?OSA, HBP, history of acute respiratory failure after accidental narcotic overdose, Obesity, MI/CHF.Hyperlipidemia, Fatigue, Non-ischemic Cardiomyopathy,  ?Last saw Dr Rexene Alberts in 2021 for OSA starting CPAP auto  ?NPSG ([facility]) 09/04/19- AHI 67.2/ hr, desaturation to 78%, body weight 263 lbs ?CPAP auto 5-20/ [facility] ?Download- ?There was concern he might be desaturating despite CPAP. ?Epworth score-16        Daughter here as Optometrist ?Body weight today-268 lbs ?Covid vax-3 Phizer ?Flu vax-had ?-----Patient has history of OSA. Patient's daughter states that he is tired all the time. Still wearing cpap. Patient falls asleep all the time. Goes to sleep tired and wakes up tired. Cardiology is going to do ECHO. ?Their first concern for now is persistent daytime sleepiness using CPAP.  He thinks he needs a higher pressure.  To do that we would need BiPAP titration to qualify him.  First we need to get a download from [place] so we know what his current  machine experience is like.  He had not excepted a trial of Adderall before because of addiction concern.  After discussion, he is willing to try modafinil short-term if needed. ?If we end up doing a BiPAP titration sleep study, that will tell us about oxygenation.  If he is going to stay on CPAP after download review, then we will want to do an overnight oximetry on CPAP. ? ?ROS-see HPI   + = positive ?Constitutional:    weight loss, night sweats, fevers, chills, +fatigue, lassitude. ?HEENT:    headaches, difficulty swallowing, tooth/dental problems, sore throat,  ?     +sneezing, itching, ear ache, nasal congestion, post nasal drip, snoring ?CV:    chest pain, orthopnea, PND, swelling in lower extremities, anasarca,            dizziness, palpitations ?Resp:   +shortness of breath with exertion or at rest.   ?             productive cough,   non-productive cough, coughing up of blood.   ?           change in color of mucus.  wheezing.   ?Skin:    rash or lesions. ?GI:  No-   heartburn, indigestion, abdominal pain, nausea, vomiting, diarrhea,  ?               change in bowel habits, loss of appetite ?GU: dysuria, change in color of urine, no urgency or frequency.   flank pain. ?MS:   +joint pain, stiffness, decreased range of motion, back pain. ?Neuro-  nothing unusual ?Psych:  change in mood or affect.  depression or anxiety.   memory loss. ? ?OBJ- Physical Exam ?General- Alert, Oriented, Affect-appropriate, Distress- none acute, + obese, yawning ?Skin- rash-none, lesions- none, excoriation- none ?Lymphadenopathy- none ?Head- atraumatic ?           Eyes- Gross vision intact, PERRLA, conjunctivae and secretions clear ?           Ears- Hearing, canals-normal ?           Nose- Clear, no-Septal dev, mucus, polyps, erosion, perforation  ?           Throat- Mallampati IV , mucosa clear , drainage- none, tonsils- atrophic ?Neck- flexible , trachea midline, no stridor , thyroid nl, carotid no bruit ?Chest - symmetrical  excursion , unlabored ?          Heart/CV- RRR , no murmur , no gallop  , no rub, nl s1 s2 ?                          - JVD- none , edema- none, stasis changes- none, varices- none ?          Lung- clear to P&A, wheeze- none, cough- none , dullness-none, rub- none ?          Chest wall-  ?Abd-  ?Br/ Gen/ Rectal- Not done, not indicated ?Extrem- cyanosis- none, clubbing, none, atrophy- none, strength- nl ?Neuro- grossly intact to observation ? ? ? ?

## 2021-09-15 NOTE — Assessment & Plan Note (Addendum)
Very poorly controlled.  He is now on low-dose statin.  I doubt that this amount of statin is causing his level of fatigue. ? ?Would like to see LDL levels at least below 100 given his other risk factors, but had no significant CAD back in 2014. ?

## 2021-09-15 NOTE — Assessment & Plan Note (Signed)
He remains out of shape, and morbidly obese.  Has not changed his weight at all.  He does not do any exercise.  He says he does not sleep well and is always fatigued.  He uses CPAP but still feels tired.  I really think he is sedentary and I think it is a highly likely that this fatigue symptom he is noting is due to lack of motivation. ? ?We are checking 2D echocardiogram to reassess his dyspnea on exertion, I think is more because of his obesity and lack of activity. ?

## 2021-09-16 ENCOUNTER — Encounter: Payer: Self-pay | Admitting: Internal Medicine

## 2021-09-16 ENCOUNTER — Ambulatory Visit (INDEPENDENT_AMBULATORY_CARE_PROVIDER_SITE_OTHER): Payer: BC Managed Care – PPO | Admitting: Internal Medicine

## 2021-09-16 DIAGNOSIS — Z6841 Body Mass Index (BMI) 40.0 and over, adult: Secondary | ICD-10-CM

## 2021-09-16 DIAGNOSIS — G4733 Obstructive sleep apnea (adult) (pediatric): Secondary | ICD-10-CM

## 2021-09-16 MED ORDER — MODAFINIL 100 MG PO TABS: ORAL_TABLET | ORAL | 1 refills | Status: DC

## 2021-09-16 NOTE — Assessment & Plan Note (Signed)
He is significantly overweight for his height but stable in this weight range.  He would likely need an external support program to achieve meaningful lifestyle change for long-term weight loss. ?

## 2021-09-16 NOTE — Assessment & Plan Note (Signed)
He does recognize benefit from CPAP but thinks he needs higher pressure.  We are working with [facility] to get a download before considering BiPAP titration. ?We will try modafinil for effect. ?We will evaluate for nocturnal hypoxemia once we have CPAP download. ?

## 2021-09-16 NOTE — Patient Instructions (Signed)
Script sent for modafinil- try one each morning as needed for alertness ? ?We are requesting a download from [place] so we can tell how the CPAP machine is working.  ?I can't set this machine any higher. If we need more pressure, we will order a BIPAP titration sleep study at the [facility] on [facility]. ? ?Please let us know if we can help. ?

## 2021-09-29 ENCOUNTER — Ambulatory Visit (HOSPITAL_COMMUNITY): Payer: BC Managed Care – PPO | Attending: Internal Medicine

## 2021-09-29 DIAGNOSIS — R5383 Other fatigue: Secondary | ICD-10-CM | POA: Insufficient documentation

## 2021-09-29 DIAGNOSIS — Z6841 Body Mass Index (BMI) 40.0 and over, adult: Secondary | ICD-10-CM | POA: Insufficient documentation

## 2021-09-29 DIAGNOSIS — R5381 Other malaise: Secondary | ICD-10-CM | POA: Diagnosis not present

## 2021-09-29 DIAGNOSIS — I5181 Takotsubo syndrome: Secondary | ICD-10-CM | POA: Insufficient documentation

## 2021-09-29 LAB — ECHOCARDIOGRAM COMPLETE
Area-P 1/2: 4.49 cm2
P 1/2 time: 374 msec
S' Lateral: 3.4 cm

## 2021-09-30 ENCOUNTER — Telehealth: Payer: Self-pay | Admitting: *Deleted

## 2021-09-30 NOTE — Telephone Encounter (Signed)
The patient's daughter Verdis Frederickson has been notified of the result and verbalized understanding.  All questions (if any) were answered. ?Raiford Simmonds, RN 09/30/2021 3:43 PM   ?

## 2021-09-30 NOTE — Telephone Encounter (Signed)
-----   Message from Leonie Man, MD sent at 09/29/2021  8:34 PM EDT ----- ?Echocardiogram shows normal pump function no wall motion abnormalities.  Ejection fraction is 60 to 65% which is up from 55 to 60%.  The right ventricle is normal function and normal pressures. ? ?No evidence of pulmonary hypertension.  Valves are normal. ?Nothing from a tachycardic standpoint that would explain fatigue. ? ?Glenetta Hew, MD ? ?

## 2021-09-30 NOTE — Telephone Encounter (Signed)
Open error 

## 2021-10-31 ENCOUNTER — Telehealth: Payer: Self-pay | Admitting: Internal Medicine

## 2021-11-04 ENCOUNTER — Telehealth: Payer: Self-pay | Admitting: Internal Medicine

## 2021-11-04 DIAGNOSIS — G4734 Idiopathic sleep related nonobstructive alveolar hypoventilation: Secondary | ICD-10-CM

## 2021-11-04 DIAGNOSIS — G4733 Obstructive sleep apnea (adult) (pediatric): Secondary | ICD-10-CM

## 2021-11-04 NOTE — Telephone Encounter (Signed)
Call returned to patient daughter, confirmed DOB. Patient daughter states even though she does feel that the patient is responding well to the pressures he is still very fatigued. She states during the office visit Dr Annamaria Boots mentioned maybe trying a bi-pap or repeating the sleep study. Daughter states the nurse did call her to let her know the cpap results were fine however she is still concerned about his fatigue.   CY please advise. Thanks :)

## 2021-11-04 NOTE — Telephone Encounter (Signed)
Spoke with Verdis Frederickson and notified of response per Dr Annamaria Boots  She verbalized understanding  Nothing further needed

## 2021-11-04 NOTE — Telephone Encounter (Signed)
Spoke with the pt's daughter  She is asking Dr Annamaria Boots to review CPAP DL and advise if he still needs CPAP or not  I printed DL from [place] and given to Dr Annamaria Boots to review  Please advise, thanks!

## 2021-11-04 NOTE — Telephone Encounter (Signed)
Download shows excellent compliance and control wearing CPAP. I would suggest he continue using it every night.  We can order a new home sleep test off CPAP if he wants to confirm that he still needs CPAP.

## 2021-11-05 NOTE — Telephone Encounter (Signed)
Suggest we order  BIPAP titration sleep study at sleep center, adding O2 if needed, for dx OSA with ncturnal hypoxemia.  He will need ov f/u with me 6 weeks later.

## 2021-11-06 NOTE — Telephone Encounter (Signed)
Called and spoke with pt's daughter Verdis Frederickson letting her know recs per CY and she verbalized understanding. Order for BIPAP titration study has been placed and OV in 6 weeks has also been scheduled for pt. Nothing further needed.

## 2021-12-14 ENCOUNTER — Ambulatory Visit (HOSPITAL_BASED_OUTPATIENT_CLINIC_OR_DEPARTMENT_OTHER): Payer: BC Managed Care – PPO | Attending: Internal Medicine | Admitting: Internal Medicine

## 2021-12-14 VITALS — Ht 67.0 in | Wt 260.0 lb

## 2021-12-14 DIAGNOSIS — I493 Ventricular premature depolarization: Secondary | ICD-10-CM | POA: Diagnosis not present

## 2021-12-14 DIAGNOSIS — G4734 Idiopathic sleep related nonobstructive alveolar hypoventilation: Secondary | ICD-10-CM

## 2021-12-14 DIAGNOSIS — G4733 Obstructive sleep apnea (adult) (pediatric): Secondary | ICD-10-CM | POA: Diagnosis present

## 2021-12-15 ENCOUNTER — Encounter (HOSPITAL_BASED_OUTPATIENT_CLINIC_OR_DEPARTMENT_OTHER): Payer: Self-pay | Admitting: *Deleted

## 2021-12-16 ENCOUNTER — Ambulatory Visit: Payer: BC Managed Care – PPO | Admitting: Internal Medicine

## 2021-12-19 ENCOUNTER — Ambulatory Visit: Payer: BC Managed Care – PPO | Admitting: Internal Medicine

## 2021-12-20 DIAGNOSIS — G4733 Obstructive sleep apnea (adult) (pediatric): Secondary | ICD-10-CM | POA: Diagnosis not present

## 2021-12-20 NOTE — Procedures (Signed)
    Patient Name: Nicholas Potts, Nicholas Potts Date: 12/14/2021 Gender: Male D.O.B: November 19, 1960 Age (years): 18 Referring Provider: Baird Lyons MD, ABSM Height (inches): 67 Interpreting Physician: Baird Lyons MD, ABSM Weight (lbs): 260 RPSGT: [place], [place] BMI: 41 MRN: [id number] Neck Size: 20.25  CLINICAL INFORMATION The patient is referred for a BiPAP titration to treat sleep apnea.  Date of NPSG, Split Night or HST: NPSG 02/20/13  AHI 79.7/ hr, body weight 240 lbs  SLEEP STUDY TECHNIQUE As per the AASM Manual for the Scoring of Sleep and Associated Events v2.3 (April 2016) with a hypopnea requiring 4% desaturations.  The channels recorded and monitored were frontal, central and occipital EEG, electrooculogram (EOG), submentalis EMG (chin), nasal and oral airflow, thoracic and abdominal wall motion, anterior tibialis EMG, snore microphone, electrocardiogram, and pulse oximetry. Bilevel positive airway pressure (BPAP) was initiated at the beginning of the study and titrated to treat sleep-disordered breathing.  MEDICATIONS Medications self-administered by patient taken the night of the study : none reported  RESPIRATORY PARAMETERS Optimal IPAP Pressure (cm): 18 AHI at Optimal Pressure (/hr) 3.9 Optimal EPAP Pressure (cm): 11   Overall Minimal O2 (%): 78.0 Minimal O2 at Optimal Pressure (%): 87.0 SLEEP ARCHITECTURE Start Time: 10:07:47 PM Stop Time: 4:31:19 AM Total Time (min): 383.5 Total Sleep Time (min): 285 Sleep Latency (min): 10.2 Sleep Efficiency (%): 74.3% REM Latency (min): 169.5 WASO (min): 88.3 Stage N1 (%): 23.5% Stage N2 (%): 59.6% Stage N3 (%): 0.0% Stage R (%): 16.8 Supine (%): 50.53 Arousal Index (/hr): 19.6   CARDIAC DATA The 2 lead EKG demonstrated sinus rhythm. The mean heart rate was 74.4 beats per minute. Other EKG findings include: PVCs.  LEG MOVEMENT DATA The total Periodic Limb Movements of Sleep (PLMS) were 0. The PLMS index was 0.0. A PLMS index of  <15 is considered normal in adults.  IMPRESSIONS - An optimal BiPAP pressure was selected for this patient ( 18 / 11 cm of water) - Severe oxygen desaturations were observed during this titration (min O2 = 78.0%). On BIPAP 18/11, minimum O2 saturation 87%, mean 91.1%. - The patient snored with soft snoring volume. - 2-lead EKG demonstrated: PVCs - Clinically significant periodic limb movements were not noted during this study. Arousals associated with PLMs were rare.  DIAGNOSIS - Obstructive Sleep Apnea (G47.33)  RECOMMENDATIONS - Trial of BiPAP therapy on 18/11 cm H2O. - Patient used a Medium size Resmed Full Face AirFit F20 mask and heated humidification. - Be careful with alcohol, sedatives and other CNS depressants that may worsen sleep apnea and disrupt normal sleep architecture. - Sleep hygiene should be reviewed to assess factors that may improve sleep quality. - Weight management and regular exercise should be initiated or continued.  [Electronically signed] 12/20/2021 11:52 AM  Baird Lyons MD, ABSM Diplomate, American Board of Sleep Medicine NPI: [id number]                         [place] [place]  ELECTRONICALLY SIGNED ON:  12/20/2021, 11:47 AM [place] PH: [phone]   FX: [phone] [place]

## 2021-12-22 NOTE — Progress Notes (Signed)
HPI M former smoker, followed for OSA,EDS,  HBP, history of acute respiratory failure after accidental narcotic overdose, Obesity, MI/CHF.Hyperlipidemia, Fatigue, Non-ischemic Cardiomyopathy,  NPSG 02/20/13  AHI 79.7/ hr, body weight 240 lbs NPSG ([facility]) 09/04/19- AHI 67.2/ hr, desaturation to 78%, body weight 263 lbs BIPAP titration sleep study 12/14/21 required BIPAP 18/11 with residual minimum O2 sat 87% and mean 91.1% =========================================================    09/16/21- 12 yoM former smoker last seen in 2019, coming for re-evaluation OSA, HBP, history of acute respiratory failure after accidental narcotic overdose, Obesity, MI/CHF.Hyperlipidemia, Fatigue, Non-ischemic Cardiomyopathy,  Last saw Dr Rexene Alberts in 2021 for OSA starting CPAP auto  NPSG ([facility]NA) 09/04/19- AHI 67.2/ hr, desaturation to 78%, body weight 263 lbs CPAP auto 5-20/ [facility] Download- There was concern he might be desaturating despite CPAP. Epworth score-16        Daughter here as Scientist, research (physical sciences) weight today-268 lbs Covid vax-3 Phizer Flu vax-had -----Patient has history of OSA. Patient's daughter states that he is tired all the time. Still wearing cpap. Patient falls asleep all the time. Goes to sleep tired and wakes up tired. Cardiology is going to do ECHO. Their first concern for now is persistent daytime sleepiness using CPAP.  He thinks he needs a higher pressure.  To do that we would need BiPAP titration to qualify him.  First we need to get a download from [place] so we know what his current machine experience is like.  He had not accepted a trial of Adderall before because of addiction concern.  After discussion, he is willing to try modafinil short-term if needed. If we end up doing a BiPAP titration sleep study, that will tell us about oxygenation.  If he is going to stay on CPAP after download review, then we will want to do an overnight oximetry on CPAP.  12/23/21- 11 yoM former smoker, followed for  OSA,EDS,  HBP, history of acute respiratory failure after accidental narcotic overdose, Obesity, MI/CHF.Hyperlipidemia, Fatigue, Non-ischemic Cardiomyopathy,  -modafinil 100 CPAP auto 5-20/ [facility]   AirSense 10 AutoSet Download compliance - 100%, AHI 1/ hr    (range 12.4-16.7 cwp) BIPAP titration sleep study 12/14/21 required BIPAP 18/11 with residual minimum O2 sat 87% and mean 91.1% Body weight today  270 lbs      Translator here today He had complained of residual tiredness on CPAP and thought he needed higher pressure. He had not tried modafinil because too expensive.  He is also quite concerned about any potentially habituating medicine.  He liked his sleep study experience with BiPAP.  ROS-see HPI   + = positive Constitutional:    weight loss, night sweats, fevers, chills, +fatigue, lassitude. HEENT:    headaches, difficulty swallowing, tooth/dental problems, sore throat,       +sneezing, itching, ear ache, nasal congestion, post nasal drip, snoring CV:    chest pain, orthopnea, PND, swelling in lower extremities, anasarca,            dizziness, palpitations Resp:   +shortness of breath with exertion or at rest.                productive cough,   non-productive cough, coughing up of blood.              change in color of mucus.  wheezing.   Skin:    rash or lesions. GI:  No-   heartburn, indigestion, abdominal pain, nausea, vomiting, diarrhea,  change in bowel habits, loss of appetite GU: dysuria, change in color of urine, no urgency or frequency.   flank pain. MS:   +joint pain, stiffness, decreased range of motion, back pain. Neuro-     nothing unusual Psych:  change in mood or affect.  depression or anxiety.   memory loss.  OBJ- Physical Exam General- Alert, Oriented, Affect-appropriate, Distress- none acute, + obese, Skin- rash-none, lesions- none, excoriation- none Lymphadenopathy- none Head- atraumatic            Eyes- Gross vision intact, PERRLA, conjunctivae  and secretions clear            Ears- Hearing, canals-normal            Nose- Clear, no-Septal dev, mucus, polyps, erosion, perforation             Throat- Mallampati IV , mucosa clear , drainage- none, tonsils- atrophic Neck- flexible , trachea midline, no stridor , thyroid nl, carotid no bruit Chest - symmetrical excursion , unlabored           Heart/CV- RRR , no murmur , no gallop  , no rub, nl s1 s2                           - JVD- none , edema- none, stasis changes- none, varices- none           Lung- clear to P&A, wheeze- none, cough- none , dullness-none, rub- none           Chest wall-  Abd-  Br/ Gen/ Rectal- Not done, not indicated Extrem- cyanosis- none, clubbing, none, atrophy- none, strength- nl Neuro- grossly intact to observation

## 2021-12-23 ENCOUNTER — Encounter: Payer: Self-pay | Admitting: Internal Medicine

## 2021-12-23 ENCOUNTER — Ambulatory Visit: Payer: BC Managed Care – PPO | Admitting: Internal Medicine

## 2021-12-23 VITALS — BP 122/70 | HR 99 | Ht 67.0 in | Wt 270.2 lb

## 2021-12-23 DIAGNOSIS — G4733 Obstructive sleep apnea (adult) (pediatric): Secondary | ICD-10-CM

## 2021-12-23 DIAGNOSIS — G4710 Hypersomnia, unspecified: Secondary | ICD-10-CM

## 2021-12-23 DIAGNOSIS — Z6841 Body Mass Index (BMI) 40.0 and over, adult: Secondary | ICD-10-CM | POA: Diagnosis not present

## 2021-12-23 MED ORDER — MODAFINIL 100 MG PO TABS: ORAL_TABLET | ORAL | 1 refills | Status: DC

## 2021-12-23 NOTE — Patient Instructions (Addendum)
Script sent again for modafinil 100 mg for alertness. One or two tabs each morning, IF NEEDED.  Order- DME [facility]- please change CPAP to BIPAP 18/11, PS 3   dx OSA based on sleep study done 12/14/21

## 2022-01-01 ENCOUNTER — Ambulatory Visit: Payer: BC Managed Care – PPO | Admitting: Family Medicine

## 2022-01-07 ENCOUNTER — Other Ambulatory Visit (HOSPITAL_COMMUNITY): Payer: Self-pay

## 2022-01-12 ENCOUNTER — Other Ambulatory Visit (HOSPITAL_COMMUNITY): Payer: Self-pay

## 2022-01-16 ENCOUNTER — Telehealth: Payer: Self-pay | Admitting: Internal Medicine

## 2022-01-16 NOTE — Telephone Encounter (Signed)
Patient called and states that he is needing another Rx placed and sent to pharmacy on   Modafinal '100mg'$  tablet, sir  It was ordered last month on 12/23/2021 with 15 tablets with 1 refill.  Is this the amount you want? Do you want to do 30 tablets with refills?  Please advise sir?

## 2022-01-19 MED ORDER — MODAFINIL 100 MG PO TABS: ORAL_TABLET | ORAL | 5 refills | Status: DC

## 2022-01-19 NOTE — Telephone Encounter (Signed)
Modafinil refilled.

## 2022-01-19 NOTE — Telephone Encounter (Signed)
ATC Nicholas Potts. Per DPR left detailed vm letting her know that patients Rx has been refilled.   Nothing further needed.

## 2022-01-28 ENCOUNTER — Telehealth: Payer: Self-pay | Admitting: Internal Medicine

## 2022-01-28 NOTE — Telephone Encounter (Signed)
Spk to Verdis Frederickson (daughter) states they would like a physical rx to take directly to [facility]. States [facility] to say [facility] are not getting the rx electronically   Spk to Clarise Cruz at [place] who will be sending over a PA per patient's insurance requiring auth on persons over 25

## 2022-01-28 NOTE — Telephone Encounter (Signed)
Can you clarify please- Did [facility] not get our prescription, or do you mean the drug is out of stock and we need to send to a different drug store or wait for [facility] to get a shipment? Thanks

## 2022-01-28 NOTE — Telephone Encounter (Signed)
Please advise 

## 2022-01-29 ENCOUNTER — Other Ambulatory Visit (HOSPITAL_COMMUNITY): Payer: Self-pay

## 2022-01-29 ENCOUNTER — Telehealth: Payer: Self-pay

## 2022-01-29 NOTE — Telephone Encounter (Signed)
Please advise on PA for modafinil

## 2022-01-29 NOTE — Telephone Encounter (Signed)
PA approved, called [facility] spk to Vicente Males who transferred me to christina who verified rx was approved for $20.   Spk to daughter and told her PA was approved and they get rx when it's ready  Nothing further

## 2022-01-29 NOTE — Telephone Encounter (Signed)
Patient Advocate Encounter   Received notification from [facility] that prior authorization is required for Modafinil '100MG'$  tablets. PA submitted and APPROVED on 01/29/2022.  Key [id number] Effective: 01/29/2022 - 01/30/2023.  Clista Bernhardt, CPhT Rx Patient Advocate Specialist Phone: [phone]

## 2022-01-29 NOTE — Telephone Encounter (Signed)
State law require that prescriptions for controlled drugs must be sent electronically. The problem is not that [facility] isn't getting the prescription.  The problem is that they need a prior auth for modafinil. Please see if our PA team can get that donee, then let me know and I will re-send the script.

## 2022-02-06 ENCOUNTER — Encounter: Payer: Self-pay | Admitting: Internal Medicine

## 2022-02-06 NOTE — Assessment & Plan Note (Signed)
He would like to try BiPAP for greater comfort Plan-change from CPAP to BiPAP 18/11

## 2022-02-06 NOTE — Assessment & Plan Note (Signed)
We are encouraging efforts but he has not shown ability to change his lifestyle for meaningful weight change so far.

## 2022-02-06 NOTE — Assessment & Plan Note (Signed)
Attention to sleep habits and consistent use of PAP, now changing to BiPAP 18/11 Plan-after discussion we rewrote prescription for modafinil to try as needed

## 2022-02-11 ENCOUNTER — Ambulatory Visit: Payer: BC Managed Care – PPO | Admitting: Cardiology

## 2022-03-22 NOTE — Progress Notes (Unsigned)
HPI M former smoker, followed for OSA,EDS,  HBP, history of acute respiratory failure after accidental narcotic overdose, Obesity, MI/CHF.Hyperlipidemia, Fatigue, Non-ischemic Cardiomyopathy,  NPSG 02/20/13  AHI 79.7/ hr, body weight 240 lbs NPSG ([facility]) 09/04/19- AHI 67.2/ hr, desaturation to 78%, body weight 263 lbs BIPAP titration sleep study 12/14/21 required BIPAP 18/11 with residual minimum O2 sat 87% and mean 91.1% =========================================================     12/23/21- 89 yoM former smoker, followed for OSA,EDS,  HBP, history of acute respiratory failure after accidental narcotic overdose, Obesity, MI/CHF.Hyperlipidemia, Fatigue, Non-ischemic Cardiomyopathy,  -modafinil 100 CPAP auto 5-20/ [facility]   AirSense 10 AutoSet Download compliance - 100%, AHI 1/ hr    (range 12.4-16.7 cwp) BIPAP titration sleep study 12/14/21 required BIPAP 18/11 with residual minimum O2 sat 87% and mean 91.1% Body weight today  270 lbs      Translator here today He had complained of residual tiredness on CPAP and thought he needed higher pressure. He had not tried modafinil because too expensive.  He is also quite concerned about any potentially habituating medicine.  He liked his sleep study experience with BiPAP.  03/24/22-  60 yoM former smoker, followed for OSA,EDS,  HBP, history of acute respiratory failure after accidental narcotic overdose, Obesity, MI/CHF.Hyperlipidemia, Fatigue, Non-ischemic Cardiomyopathy,  -modafinil 100 BIPAP titration sleep study 12/14/21 required BIPAP 18/11 with residual minimum O2 sat 87% and mean 91.1% BIPAP 18/11, PS3/ [facility]- ordered 12/23/21 Body weight today      Translator here today Covid vax- Flu vax-had      ROS-see HPI   + = positive Constitutional:    weight loss, night sweats, fevers, chills, +fatigue, lassitude. HEENT:    headaches, difficulty swallowing, tooth/dental problems, sore throat,       +sneezing, itching, ear ache, nasal congestion, post  nasal drip, snoring CV:    chest pain, orthopnea, PND, swelling in lower extremities, anasarca,            dizziness, palpitations Resp:   +shortness of breath with exertion or at rest.                productive cough,   non-productive cough, coughing up of blood.              change in color of mucus.  wheezing.   Skin:    rash or lesions. GI:  No-   heartburn, indigestion, abdominal pain, nausea, vomiting, diarrhea,                 change in bowel habits, loss of appetite GU: dysuria, change in color of urine, no urgency or frequency.   flank pain. MS:   +joint pain, stiffness, decreased range of motion, back pain. Neuro-     nothing unusual Psych:  change in mood or affect.  depression or anxiety.   memory loss.  OBJ- Physical Exam General- Alert, Oriented, Affect-appropriate, Distress- none acute, + obese, Skin- rash-none, lesions- none, excoriation- none Lymphadenopathy- none Head- atraumatic            Eyes- Gross vision intact, PERRLA, conjunctivae and secretions clear            Ears- Hearing, canals-normal            Nose- Clear, no-Septal dev, mucus, polyps, erosion, perforation             Throat- Mallampati IV , mucosa clear , drainage- none, tonsils- atrophic Neck- flexible , trachea midline, no stridor , thyroid nl, carotid no bruit Chest - symmetrical  excursion , unlabored           Heart/CV- RRR , no murmur , no gallop  , no rub, nl s1 s2                           - JVD- none , edema- none, stasis changes- none, varices- none           Lung- clear to P&A, wheeze- none, cough- none , dullness-none, rub- none           Chest wall-  Abd-  Br/ Gen/ Rectal- Not done, not indicated Extrem- cyanosis- none, clubbing, none, atrophy- none, strength- nl Neuro- grossly intact to observation

## 2022-03-23 ENCOUNTER — Telehealth: Payer: Self-pay

## 2022-03-24 ENCOUNTER — Other Ambulatory Visit: Payer: Self-pay | Admitting: Family Medicine

## 2022-03-24 ENCOUNTER — Ambulatory Visit: Payer: BC Managed Care – PPO | Admitting: Internal Medicine

## 2022-03-24 ENCOUNTER — Encounter: Payer: Self-pay | Admitting: Internal Medicine

## 2022-03-24 DIAGNOSIS — G4733 Obstructive sleep apnea (adult) (pediatric): Secondary | ICD-10-CM | POA: Diagnosis not present

## 2022-03-24 DIAGNOSIS — G4489 Other headache syndrome: Secondary | ICD-10-CM

## 2022-03-24 DIAGNOSIS — E785 Hyperlipidemia, unspecified: Secondary | ICD-10-CM

## 2022-03-24 NOTE — Patient Instructions (Signed)
Please call [facility] to make an appointment to go in to their office to set up new BIPAP machine. Remind them he needs "AirView"  Try taking modafinil 2 tabs ( 200 mg). If that is better, and not too strong, I can change his prescription at the drug store.

## 2022-03-25 ENCOUNTER — Encounter: Payer: Self-pay | Admitting: Internal Medicine

## 2022-03-25 NOTE — Assessment & Plan Note (Signed)
We are still trying to get [facility] to set him up with a replacement BiPAP machine Plan-patient's daughter is calling [facility] tomorrow to set up appointment for BiPAP instruction.

## 2022-03-25 NOTE — Assessment & Plan Note (Signed)
He remains significantly obese with no apparent effort to change.  Diet and exercise are encouraged.

## 2022-04-07 ENCOUNTER — Telehealth (INDEPENDENT_AMBULATORY_CARE_PROVIDER_SITE_OTHER): Payer: BC Managed Care – PPO | Admitting: Family Medicine

## 2022-04-07 ENCOUNTER — Encounter: Payer: Self-pay | Admitting: Family Medicine

## 2022-04-07 VITALS — Ht 67.0 in | Wt 270.0 lb

## 2022-04-07 DIAGNOSIS — U071 COVID-19: Secondary | ICD-10-CM | POA: Diagnosis not present

## 2022-04-07 MED ORDER — MOLNUPIRAVIR EUA 200MG CAPSULE: 4.0000 | ORAL_CAPSULE | Freq: Two times a day (BID) | ORAL | 0 refills | Status: AC

## 2022-04-07 NOTE — Progress Notes (Signed)
Virtual Visit via Video   I connected with patient on 04/07/22 at 11:00 AM EDT by a video enabled telemedicine application and verified that I am speaking with the correct person using two identifiers.  Location patient: Home Location provider: [facility] Persons participating in the virtual visit: Patient, Provider, [place] Marcille Blanco C)  I discussed the limitations of evaluation and management by telemedicine and the availability of in person appointments. The patient expressed understanding and agreed to proceed.  Subjective:   HPI:   COVID- 'really bad headache, fever, chills, body aches.  Cough w/ a lot of phlegm'.  Fatigue w/ minimal exertion.  Sxs started Saturday.  Tested + for COVID on Sunday.  This is pt's first infection.  Drinking lots of fluids.  Taking Nyquil, ibuprofen, cough syrup, Theraflu.  Some mild relief w/ OTC medications  ROS:   See pertinent positives and negatives per HPI.  Patient Active Problem List   Diagnosis Date Noted   Morbid obesity with BMI of 40.0-44.9, adult ([place]) 09/10/2021   Fatigue 09/10/2021   History of colonic polyps    Polyp of ascending colon    Polyp of transverse colon    Polyp of cecum    Polyp of descending colon    Polyp of sigmoid colon    Hypersomnia 11/29/2017   Other headache syndrome 05/05/2017   Vertigo 05/05/2017   IFG (impaired fasting glucose) 08/21/2016   Obesity (BMI 30-39.9) 06/23/2013   Stress-induced cardiomyopathy -- essentially resolved    CVA (cerebral infarction)    OSA (obstructive sleep apnea)    Hyperlipidemia    Physical deconditioning 01/24/2013   Tinea pedis 01/18/2013   Essential hypertension 01/13/2013   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  10/29/15 - "a beer every week or so"    Current Outpatient Medications:    aspirin EC 81 MG tablet, Take 1 tablet (81 mg total) by mouth daily., Disp: 90 tablet, Rfl: 3   atorvastatin (LIPITOR) 10 MG tablet, TAKE 1 TABLET(10 MG) BY MOUTH DAILY, Disp: 90 tablet, Rfl: 0   azelastine (OPTIVAR) 0.05 % ophthalmic solution, Place 1 drop into both eyes 2 (two) times daily., Disp: 6 mL, Rfl: 1   modafinil (PROVIGIL) 100 MG tablet, 1 each morning as needed for alertness., Disp: 31 tablet, Rfl: 5   Multiple Vitamin (MULTIVITAMIN WITH MINERALS) TABS tablet, Take 1 tablet by mouth daily., Disp: , Rfl:   Allergies  Allergen Reactions   Oxycodone     Oxygen levels drop    Objective:   Ht '5\' 7"'$  (1.702 m)   Wt 270 lb (122.5 kg)   BMI 42.29 kg/m  AAOx3, NAD NCAT, EOMI No obvious CN deficits Coloring WNL Pt is able to speak clearly, coherently without shortness of breath or increased work of breathing.  Thought process is linear.  Mood is appropriate.   Assessment and Plan:   COVID- new.  Pt developed sxs on Saturday  and tested + on Sunday.  Reports HA, body aches, cough, and chills are severe.  SOB w/ minimal exertion.  Will start molnupiravir and reviewed supportive care.  Discussed red flags w/ pt and daughter that should prompt immediate evaluation including worsening SOB, difficulty breathing, dehydration.  Pt expressed understanding and is in agreement w/ plan.    Annye Asa, MD 04/07/2022

## 2022-05-19 ENCOUNTER — Ambulatory Visit: Payer: BC Managed Care – PPO | Admitting: Cardiology

## 2022-06-10 ENCOUNTER — Ambulatory Visit (INDEPENDENT_AMBULATORY_CARE_PROVIDER_SITE_OTHER): Payer: BC Managed Care – PPO | Admitting: Family Medicine

## 2022-06-10 ENCOUNTER — Encounter: Payer: Self-pay | Admitting: Family Medicine

## 2022-06-10 VITALS — BP 132/72 | HR 86 | Temp 98.6°F | Ht 65.25 in | Wt 271.0 lb

## 2022-06-10 DIAGNOSIS — E785 Hyperlipidemia, unspecified: Secondary | ICD-10-CM | POA: Diagnosis not present

## 2022-06-10 DIAGNOSIS — N3943 Post-void dribbling: Secondary | ICD-10-CM

## 2022-06-10 DIAGNOSIS — R5383 Other fatigue: Secondary | ICD-10-CM

## 2022-06-10 DIAGNOSIS — R3915 Urgency of urination: Secondary | ICD-10-CM

## 2022-06-10 DIAGNOSIS — R7303 Prediabetes: Secondary | ICD-10-CM

## 2022-06-10 DIAGNOSIS — H9193 Unspecified hearing loss, bilateral: Secondary | ICD-10-CM

## 2022-06-10 DIAGNOSIS — H6123 Impacted cerumen, bilateral: Secondary | ICD-10-CM

## 2022-06-10 NOTE — Patient Instructions (Addendum)
I will check labs today. I am glad to hear that sleep better. Follow up to discuss fatigue further in next few weeks.  I will refer you to urologist to discuss urinary symptoms.  Try Debrox over the counter for ears, but we are happy to perform a procedure here if that is ineffective. Please call and we will get you an appointment.  Avoid Q tips in ears.   Acumulacin de cera en el odo en adultos Earwax Buildup, Adult Los odos producen una sustancia llamada cera que ayuda a evitar que ingresen bacterias en el odo y protege la piel del canal [place]. En ocasiones, la cera se puede acumular en el odo y causar molestias o prdida de la audicin. Cules son las causas? Esta afeccin es causada por una acumulacin de cera. Los canales auditivos se limpian por s solos. La cera de los odos se produce en la parte externa del canal auditivo y generalmente cae hacia afuera en pequeas cantidades con el tiempo. Cuando el mecanismo de autolimpieza no funciona, la cera se acumula y eso puede reducir la audicin y Engineer, drilling. Tratar de [employer] odos con hisopos puede empujar la cera hacia el interior del canal auditivo y Engineer, drilling y disminucin de la audicin. Qu incrementa el riesgo? Es ms probable que Orthoptist en las personas que presentan alguna de estas caractersticas: Se limpian frecuentemente los odos con hisopos. Se escarban los odos. Utilizan tapones para los odos o auriculares internos con frecuencia, o usan audfonos. Los siguientes factores tambin pueden hacer que usted sea ms propenso a Armed forces training and education officer afeccin: Ser hombre. Ser Ardelia Mems persona de edad avanzada. Producir ms cera de forma natural. Tener los canales [employer]. Tener cera que es demasiado densa o pegajosa. Tener vello excesivo en el Control and instrumentation engineer. Tener eccema. Estar deshidratado. Cules son los signos o sntomas? Los sntomas de esta afeccin incluyen: Disminucin de la  audicin. Sensacin de que el odo est lleno u obstruido. Secrecin de lquido. Dolor o Biomedical engineer odo. Zumbidos en el odo. Tos. Trastornos del equilibrio. Porcin de cera que se puede observar en el interior del canal auditivo. Cmo se diagnostica? Esta afeccin se puede diagnosticar en funcin de lo siguiente: Sus sntomas. Sus antecedentes mdicos. Un examen de odo. Durante el examen, el mdico mirar dentro de su odo con un instrumento llamado otoscopio. Pueden hacerle otros estudios, como una prueba de audicin. Cmo se trata? El tratamiento para esta afeccin puede incluir lo siguiente: Gotas ticas para ablandar la cera. Extraccin de cera realizada por un mdico. El mdico tambin podr hacer lo siguiente: Enjuagar el odo con agua. Usar un instrumento con un anillo en el extremo (cureta). Usar un dispositivo de succin. Realizar una ciruga para extraer la acumulacin de cera. Esto puede Editor, commissioning graves. Siga estas instrucciones en su casa:  Use los medicamentos de venta libre y los recetados solamente como se lo haya indicado el mdico. No se introduzca ningn objeto en el odo, ni siquiera hisopos de algodn. La abertura del canal auditivo se puede limpiar con un pao o pauelo facial. Siga las instrucciones del mdico acerca de cmo limpiarse los odos. No se limpie los odos en exceso. Beber suficiente lquido como para Theatre manager la orina de color amarillo plido. Esto ayudar a Cabin crew. Concurra a todas las visitas de seguimiento como se lo hayan indicado. Si tiene acumulacin de cera en el odo con frecuencia o [place] audfonos, visite a su mdico para  que le realice una limpieza de odo de [place] y preventiva. Pregntele al mdico con qu frecuencia debe programar estas limpiezas. Si tiene audfonos, lmpielos segn las instrucciones del fabricante y de su mdico. Comunquese con un mdico si: Tiene dolor de odo. Presenta fiebre. Le sale pus u  otro lquido del odo. Tiene prdida de la audicin. Tiene zumbidos en el odo que no desaparecen. Tiene la sensacin de que la habitacin da vueltas (vrtigo). Los sntomas no mejoran con Dispensing optician. Solicite ayuda de inmediato si: Le sangra el odo afectado. Tiene dolor intenso de odo. Resumen La cera se puede acumular en el odo y causar molestias o prdida de la audicin. Los sntomas ms comunes de esta afeccin incluyen una audicin reducida o [place] y la sensacin de que el odo est lleno o est obstruido. Esta afeccin se puede diagnosticar en funcin de los sntomas, sus antecedentes mdicos y un examen de odo. Esta afeccin se puede tratar mediante gotas ticas que ablandan la cera o mediante la extraccin de la cera que realiza el mdico. No se introduzca ningn objeto en el odo, ni siquiera hisopos de algodn. La abertura del canal auditivo se puede limpiar con un pao o pauelo facial. Esta informacin no tiene como fin reemplazar el consejo del mdico. Asegrese de hacerle al mdico cualquier pregunta que tenga. Document Revised: 11/23/2019 Document Reviewed: 11/23/2019 Elsevier Patient Education  [place].

## 2022-06-10 NOTE — Progress Notes (Signed)
Subjective:  Patient ID: Nicholas Potts, male    DOB: 08-25-1960  Age: 62 y.o. MRN: [id number]  CC:  Chief Complaint  Patient presents with   Annual Exam    Pt reports does have trouble hearing wanted checked and confirmed this is wax and he will  make future appt to have cleared no other issues    IN PERSON INTERPRETER PRESENT.  HPI Nicholas Potts presents for Annual Exam initially but due to other concerns below, plan for physical at later date.   Followed by pulmonary, Dr. Annamaria Boots for obstructive sleep apnea, hypersomnia, obesity.  On CPAP, and Provigil. Cardiology, Dr. Ellyn Hack with stress-induced cardiomyopathy, hypertension.  I last saw him in January 2023, discussed fatigue and urinary symptoms at that time. Asked that he follow-up with me in 6 weeks at that time to recheck his cholesterol Lipitor 10 mg daily at that time. Additional hx - now on Bipap and provigil are new to help with sleepiness - about 25-30% better sleep. Still fatigued.   Urinary difficulties: Some trouble holding urine, and some dribbling of urine at times. Past year. Same symptoms. Not better or worse. No hematuria, or penile d/c.  Discussed last year - same. PSA normal in 05/2021. No fever, no abd pain. No retention.  Lab Results  Component Value Date   PSA1 0.7 05/20/2020   PSA1 0.7 10/21/2017   PSA1 0.7 02/05/2017   PSA 0.73 05/28/2021   PSA 0.82 06/29/2015   Hearing difficulty: As above.  Past 2 months. Decreased hearing in R in am, better during day. Left similar. No pain.  No recent home treatment, lavage 5-6 yrs ago for similar sx's helped. No otc treatments.   Prediabetes: With obesity.  Lab Results  Component Value Date   HGBA1C 5.8 04/24/2021   Wt Readings from Last 3 Encounters:  06/10/22 271 lb (122.9 kg)  04/07/22 270 lb (122.5 kg)  03/24/22 270 lb (122.5 kg)   Hyperlipidemia: As above, started on Lipitor in January, 6-week follow-up planned, has not had labs checked since  last December. Taking lipitor daily. No new myalgias or new side effects.  Takes flanex otc for back pain at times. Doing ok.   Lab Results  Component Value Date   CHOL 247 (H) 05/28/2021   HDL 35.00 (L) 05/28/2021   LDLCALC 136 (H) 05/20/2020   LDLDIRECT 185.0 05/28/2021   TRIG 307.0 (H) 05/28/2021   CHOLHDL 7 05/28/2021   Lab Results  Component Value Date   ALT 19 04/24/2021   AST 19 04/24/2021   ALKPHOS 83 04/24/2021   BILITOT 0.4 04/24/2021       04/07/2022   10:50 AM 06/16/2021    9:21 AM 05/28/2021    2:11 PM 05/28/2021    2:10 PM 04/24/2021   10:36 AM  Depression screen PHQ 2/9  Decreased Interest 0 '1 1 1 '$ 0  Down, Depressed, Hopeless 0 '1 1 1 '$ 0  PHQ - 2 Score 0 '2 2 2 '$ 0  Altered sleeping 0 0 0    Tired, decreased energy 0 3 3    Change in appetite 0 2 1    Feeling bad or failure about yourself  0 0 0    Trouble concentrating 0 0 1    Moving slowly or fidgety/restless 0 0 0    Suicidal thoughts 0 0 0    PHQ-9 Score 0 7 7    Difficult doing work/chores Not difficult at all  Not difficult at all  History Patient Active Problem List   Diagnosis Date Noted   Morbid obesity with BMI of 40.0-44.9, adult ([place]) 09/10/2021   Fatigue 09/10/2021   History of colonic polyps    Polyp of ascending colon    Polyp of transverse colon    Polyp of cecum    Polyp of descending colon    Polyp of sigmoid colon    Hypersomnia 11/29/2017   Other headache syndrome 05/05/2017   Vertigo 05/05/2017   IFG (impaired fasting glucose) 08/21/2016   Obesity (BMI 30-39.9) 06/23/2013   Stress-induced cardiomyopathy -- essentially resolved    CVA (cerebral infarction)    OSA (obstructive sleep apnea)    Hyperlipidemia    Physical deconditioning 01/24/2013   Tinea pedis 01/18/2013   Essential hypertension 01/13/2013   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   ARF (acute renal failure) with tubular necrosis requiring HD 01/11/2013   Anemia 01/11/2013   Thrombocytopenia,  unspecified ([place]) 01/11/2013   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 12/29/2012   Past Medical History:  Diagnosis Date   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[place]) 2014   post op    ARF (acute respiratory failure) ([place]) 2014   Blood dyscrasia    Chronic kidney disease 6767   ARF    Complication of anesthesia 2014   - post op - in combination of pain pills-  Cardiac and Respiratory Arrest- prior to sleep apnea diagonosis   Constipation    CVA (cerebral infarction)    Hyperlipidemia    Hypertension    OSA (obstructive sleep apnea)    Pre-diabetes    Short-term memory loss    Sleep apnea    Stress-induced cardiomyopathy -- RESOLVED    Post-op Narcotic induced Acute Hypoxic respiratory failure with respiratory and PEA cardiac arrest following accidental narcotic overdose; initial [place] 20-20%, now improved to 55%   Was supported with Impella Cardiac Cath - non-obstructive CAD ; 1 month after event - EF up from 25% to 55-60% - stable x 5 yrs   Thrombocytopenia ([place]) 2014   Past Surgical History:  Procedure Laterality Date   ACHILLES TENDON REPAIR     BIOPSY  08/01/2019   Procedure: BIOPSY;  Surgeon: Mauri Pole, MD;  Location: [facility];  Service: Endoscopy;;   BIOPSY  08/05/2020   Procedure: BIOPSY;  Surgeon: Mauri Pole, MD;  Location: [facility];  Service: Endoscopy;;   CARDIAC CATHETERIZATION  12/29/2012   RN LHC --> Impella. for severe shock with low EF. Mild to moderate RV pressure elevation. Normal coronary arteries. Cardiac Output 2.5, Index 1.46.   COLONOSCOPY WITH PROPOFOL N/A 10/30/2015   Procedure: COLONOSCOPY WITH PROPOFOL;  Surgeon: Mauri Pole, MD;  Location: [facility];  Service: Endoscopy;  Laterality: N/A;   COLONOSCOPY WITH PROPOFOL N/A 08/01/2019   Procedure: COLONOSCOPY WITH PROPOFOL;  Surgeon: Mauri Pole, MD;  Location: [facility];  Service: Endoscopy;  Laterality: N/A;    COLONOSCOPY WITH PROPOFOL N/A 08/05/2020   Procedure: COLONOSCOPY WITH PROPOFOL;  Surgeon: Mauri Pole, MD;  Location: [facility];  Service: Endoscopy;  Laterality: N/A;   INSERTION OF DIALYSIS CATHETER Right 01/09/2013   Procedure: INSERTION OF DIALYSIS CATHETER;  Surgeon: Angelia Mould, MD;  Location: [facility];  Service: Vascular;  Laterality: Right;   LEFT AND RIGHT HEART CATHETERIZATION WITH CORONARY ANGIOGRAM  12/29/2012   Procedure: LEFT AND RIGHT HEART CATHETERIZATION WITH CORONARY ANGIOGRAM;  Surgeon: Sherren Mocha, MD;  Location: [facility] LAB;  Service: Cardiovascular;;   POLYPECTOMY  08/01/2019   Procedure: POLYPECTOMY;  Surgeon: Mauri Pole, MD;  Location: [facility];  Service: Endoscopy;;   POLYPECTOMY  08/05/2020   Procedure: POLYPECTOMY;  Surgeon: Mauri Pole, MD;  Location: [facility];  Service: Endoscopy;;   TRANSTHORACIC ECHOCARDIOGRAM  12/29/2012   EF 25% with periapical HK/AKA.  --> Followup echo 7/25 -- EF of 35%   TRANSTHORACIC ECHOCARDIOGRAM  01/2013 ; 07/2015   a) EF 55-60%. No regional WMA. Grade 1 diastolic dysfunction. Mild LA dilatation.; b) Normal LV size with mild LV hypertrophy. EF 55-60%. Normal RV   TRANSTHORACIC ECHOCARDIOGRAM  03/2018   EF 55-60%.  NO RWMA. Normal valves   Allergies  Allergen Reactions   Oxycodone     Oxygen levels drop   Prior to Admission medications   Medication Sig Start Date End Date Taking? Authorizing Provider  aspirin EC 81 MG tablet Take 1 tablet (81 mg total) by mouth daily. 05/13/17   Almyra Deforest, PA  atorvastatin (LIPITOR) 10 MG tablet TAKE 1 TABLET(10 MG) BY MOUTH DAILY 03/24/22   [name], MD  azelastine (OPTIVAR) 0.05 % ophthalmic solution Place 1 drop into both eyes 2 (two) times daily. 05/12/21   [name], MD  modafinil (PROVIGIL) 100 MG tablet 1 each morning as needed for alertness. 01/19/22   Deneise Lever, MD  Multiple Vitamin (MULTIVITAMIN WITH MINERALS) TABS tablet  Take 1 tablet by mouth daily.    [provider]   Social History   Socioeconomic History   Marital status: Married    Spouse name: Not on file   Number of children: Not on file   Years of education: Not on file   Highest education level: Not on file  Occupational History   Not on file  Tobacco Use   Smoking status: Former    Types: Cigarettes    Quit date: 06/20/1986    Years since quitting: 35.9   Smokeless tobacco: Never  Vaping Use   Vaping Use: Never used  Substance and Sexual Activity   Alcohol use: Yes    Comment: History of heavy use but only social at present.  10/29/15 - "a beer every week or so"   Drug use: No    Comment: Prior cocaine use but none since 2000   Sexual activity: Not Currently  Other Topics Concern   Not on file  Social History Narrative   He is widowed for 13 years, and has 2 daughters: [name] & [address] [employer], and one son Zeplin Aleshire. the other history is daughter who is here with him today.    He works for [employer] as a [employer].   He quit smoking in 1988. He does drink alcohol socially, but he does have a history of heavy use in the past. He also has a prior history of cocaine use but none since 2000.   Social Determinants of Health   Financial Resource Strain: Not on file  Food Insecurity: Not on file  Transportation Needs: Not on file  Physical Activity: Not on file  Stress: Not on file  Social Connections: Not on file  Intimate Partner Violence: Not on file    Review of Systems Per HPI  Objective:   Vitals:   06/10/22 1558  BP: 132/72  Pulse: 86  Temp: 98.6 F (37 C)  TempSrc: Oral  SpO2: 97%  Weight: 271 lb (122.9 kg)  Height: 5' 5.25" (  1.657 m)     Physical Exam Vitals reviewed.  Constitutional:      Appearance: He is well-developed.  HENT:     Head: Normocephalic and atraumatic.     Right Ear: External ear normal. There is impacted cerumen.     Left  Ear: External ear normal. There is impacted cerumen.     Ears:     Comments: Pinna nontender bilat Neck:     Vascular: No carotid bruit or JVD.  Cardiovascular:     Rate and Rhythm: Normal rate and regular rhythm.     Heart sounds: Normal heart sounds. No murmur heard. Pulmonary:     Effort: Pulmonary effort is normal.     Breath sounds: Normal breath sounds. No rales.  Abdominal:     Tenderness: There is no abdominal tenderness. There is no right CVA tenderness or left CVA tenderness.  Musculoskeletal:     Right lower leg: No edema.     Left lower leg: No edema.  Skin:    General: Skin is warm and dry.  Neurological:     Mental Status: He is alert and oriented to person, place, and time.  Psychiatric:        Mood and Affect: Mood normal.        Behavior: Behavior normal.       Assessment & Plan:  Nicholas Potts is a 62 y.o. male . Fatigue, unspecified type - Plan: CBC  -History of OSA and hypersomnolence.  On Provigil.  And now on BiPAP.  Some improved sleep but persistent fatigue.  Check CBC, CMP, plan to discuss further in the next few weeks as ongoing/persistent symptoms without new symptoms.  RTC precautions if worsening.  Urinary urgency - Plan: PSA, Ambulatory referral to Urology Urinary dribbling - Plan: PSA, Ambulatory referral to Urology  -Persistent symptoms, check updated PSA and refer to urology to decide on further pain/treatment.  RTC precautions if changes.  Hyperlipidemia, unspecified hyperlipidemia type - Plan: Comprehensive metabolic panel, Lipid panel  -Tolerating Lipitor, check updated labs with discussion of results at follow-up visit next few weeks  Decreased hearing of both ears Bilateral impacted cerumen  -Does use hearing protection at work that may created the cotton tip swabs.  Denies use of cotton tip swabs.  Impaction, right greater than left.  Option of lavage in office but can try initial approach at home with Debrox.  If ineffective or  worsens, call to schedule in person visit for cerumen lavage.  Prediabetes - Plan: Hemoglobin A1c  -Check updated A1c and discuss at follow-up visit   Will recheck in next few weeks, and we will schedule physical in the next few months depending on follow-up plan from his next visit.    No orders of the defined types were placed in this encounter.  Patient Instructions  I will check labs today. I am glad to hear that sleep better. Follow up to discuss fatigue further in next few weeks.  I will refer you to urologist to discuss urinary symptoms.  Try Debrox over the counter for ears, but we are happy to perform a procedure here if that is ineffective. Please call and we will get you an appointment.  Avoid Q tips in ears.   Acumulacin de cera en el odo en adultos Earwax Buildup, Adult Los odos producen una sustancia llamada cera que ayuda a evitar que ingresen bacterias en el odo y protege la piel del canal [place]. En ocasiones, la cera se puede acumular en  el odo y causar molestias o prdida de la audicin. Cules son las causas? Esta afeccin es causada por una acumulacin de cera. Los canales auditivos se limpian por s solos. La cera de los odos se produce en la parte externa del canal auditivo y generalmente cae hacia afuera en pequeas cantidades con el tiempo. Cuando el mecanismo de autolimpieza no funciona, la cera se acumula y eso puede reducir la audicin y Engineer, drilling. Tratar de [employer] odos con hisopos puede empujar la cera hacia el interior del canal auditivo y Engineer, drilling y disminucin de la audicin. Qu incrementa el riesgo? Es ms probable que Orthoptist en las personas que presentan alguna de estas caractersticas: Se limpian frecuentemente los odos con hisopos. Se escarban los odos. Utilizan tapones para los odos o auriculares internos con frecuencia, o usan audfonos. Los siguientes factores tambin pueden hacer que usted sea ms  propenso a Armed forces training and education officer afeccin: Ser hombre. Ser Ardelia Mems persona de edad avanzada. Producir ms cera de forma natural. Tener los canales [employer]. Tener cera que es demasiado densa o pegajosa. Tener vello excesivo en el Control and instrumentation engineer. Tener eccema. Estar deshidratado. Cules son los signos o sntomas? Los sntomas de esta afeccin incluyen: Disminucin de la audicin. Sensacin de que el odo est lleno u obstruido. Secrecin de lquido. Dolor o Biomedical engineer odo. Zumbidos en el odo. Tos. Trastornos del equilibrio. Porcin de cera que se puede observar en el interior del canal auditivo. Cmo se diagnostica? Esta afeccin se puede diagnosticar en funcin de lo siguiente: Sus sntomas. Sus antecedentes mdicos. Un examen de odo. Durante el examen, el mdico mirar dentro de su odo con un instrumento llamado otoscopio. Pueden hacerle otros estudios, como una prueba de audicin. Cmo se trata? El tratamiento para esta afeccin puede incluir lo siguiente: Gotas ticas para ablandar la cera. Extraccin de cera realizada por un mdico. El mdico tambin podr hacer lo siguiente: Enjuagar el odo con agua. Usar un instrumento con un anillo en el extremo (cureta). Usar un dispositivo de succin. Realizar una ciruga para extraer la acumulacin de cera. Esto puede Editor, commissioning graves. Siga estas instrucciones en su casa:  Use los medicamentos de venta libre y los recetados solamente como se lo haya indicado el mdico. No se introduzca ningn objeto en el odo, ni siquiera hisopos de algodn. La abertura del canal auditivo se puede limpiar con un pao o pauelo facial. Siga las instrucciones del mdico acerca de cmo limpiarse los odos. No se limpie los odos en exceso. Beber suficiente lquido como para Theatre manager la orina de color amarillo plido. Esto ayudar a Cabin crew. Concurra a todas las visitas de seguimiento como se lo hayan indicado. Si tiene  acumulacin de cera en el odo con frecuencia o [place] audfonos, visite a su mdico para que le realice una limpieza de odo de rutina y preventiva. Pregntele al mdico con qu frecuencia debe programar estas limpiezas. Si tiene audfonos, lmpielos segn las instrucciones del fabricante y de su mdico. Comunquese con un mdico si: Tiene dolor de odo. Presenta fiebre. Le sale pus u otro lquido del odo. Tiene prdida de la audicin. Tiene zumbidos en el odo que no desaparecen. Tiene la sensacin de que la habitacin da vueltas (vrtigo). Los sntomas no mejoran con Dispensing optician. Solicite ayuda de inmediato si: Le sangra el odo afectado. Tiene dolor intenso de odo. Resumen La cera se puede acumular en el odo y causar molestias o prdida de la  audicin. Los sntomas ms comunes de esta afeccin incluyen una audicin reducida o [place] y la sensacin de que el odo est lleno o est obstruido. Esta afeccin se puede diagnosticar en funcin de los sntomas, sus antecedentes mdicos y un examen de odo. Esta afeccin se puede tratar mediante gotas ticas que ablandan la cera o mediante la extraccin de la cera que realiza el mdico. No se introduzca ningn objeto en el odo, ni siquiera hisopos de algodn. La abertura del canal auditivo se puede limpiar con un pao o pauelo facial. Esta informacin no tiene como fin reemplazar el consejo del mdico. Asegrese de hacerle al mdico cualquier pregunta que tenga. Document Revised: 11/23/2019 Document Reviewed: 11/23/2019 Elsevier Patient Education  [place],   Merri Ray, MD [place] 06/10/22 4:51 PM

## 2022-06-12 ENCOUNTER — Other Ambulatory Visit (INDEPENDENT_AMBULATORY_CARE_PROVIDER_SITE_OTHER): Payer: BC Managed Care – PPO

## 2022-06-12 DIAGNOSIS — R3915 Urgency of urination: Secondary | ICD-10-CM

## 2022-06-12 DIAGNOSIS — R7303 Prediabetes: Secondary | ICD-10-CM | POA: Diagnosis not present

## 2022-06-12 DIAGNOSIS — N3943 Post-void dribbling: Secondary | ICD-10-CM

## 2022-06-12 DIAGNOSIS — R5383 Other fatigue: Secondary | ICD-10-CM | POA: Diagnosis not present

## 2022-06-12 DIAGNOSIS — E785 Hyperlipidemia, unspecified: Secondary | ICD-10-CM

## 2022-06-12 LAB — COMPREHENSIVE METABOLIC PANEL
ALT: 24 U/L (ref 0–53)
AST: 20 U/L (ref 0–37)
Albumin: 4.2 g/dL (ref 3.5–5.2)
Alkaline Phosphatase: 81 U/L (ref 39–117)
BUN: 14 mg/dL (ref 6–23)
CO2: 27 mEq/L (ref 19–32)
Calcium: 9 mg/dL (ref 8.4–10.5)
Chloride: 106 mEq/L (ref 96–112)
Creatinine, Ser: 1.03 mg/dL (ref 0.40–1.50)
GFR: 77.55 mL/min (ref >60.00)
Glucose, Bld: 93 mg/dL (ref 70–99)
Potassium: 4.2 mEq/L (ref 3.5–5.1)
Sodium: 141 mEq/L (ref 135–145)
Total Bilirubin: 0.6 mg/dL (ref 0.2–1.2)
Total Protein: 6.8 g/dL (ref 6.0–8.3)

## 2022-06-12 LAB — CBC
HCT: 44.4 % (ref 39.0–52.0)
Hemoglobin: 15 g/dL (ref 13.0–17.0)
MCHC: 33.8 g/dL (ref 30.0–36.0)
MCV: 97.5 fl (ref 78.0–100.0)
Platelets: 289 10*3/uL (ref 150.0–400.0)
RBC: 4.56 Mil/uL (ref 4.22–5.81)
RDW: 13.1 % (ref 11.5–15.5)
WBC: 8.6 10*3/uL (ref 4.0–10.5)

## 2022-06-12 LAB — LIPID PANEL
Cholesterol: 193 mg/dL (ref 0–200)
HDL: 32 mg/dL — ABNORMAL LOW (ref >39.00)
NonHDL: 160.74
Total CHOL/HDL Ratio: 6
Triglycerides: 248 mg/dL — ABNORMAL HIGH (ref 0.0–149.0)
VLDL: 49.6 mg/dL — ABNORMAL HIGH (ref 0.0–40.0)

## 2022-06-12 LAB — PSA: PSA: 0.74 ng/mL (ref 0.10–4.00)

## 2022-06-12 LAB — HEMOGLOBIN A1C: Hgb A1c MFr Bld: 6.2 % (ref 4.6–6.5)

## 2022-06-12 LAB — LDL CHOLESTEROL, DIRECT: Direct LDL: 124 mg/dL

## 2022-06-24 ENCOUNTER — Other Ambulatory Visit: Payer: Self-pay | Admitting: Family Medicine

## 2022-06-24 DIAGNOSIS — E785 Hyperlipidemia, unspecified: Secondary | ICD-10-CM

## 2022-07-01 ENCOUNTER — Encounter: Payer: Self-pay | Admitting: Family Medicine

## 2022-07-01 ENCOUNTER — Ambulatory Visit: Payer: BC Managed Care – PPO | Admitting: Family Medicine

## 2022-07-01 VITALS — BP 128/70 | HR 86 | Temp 98.0°F | Ht 65.5 in | Wt 267.2 lb

## 2022-07-01 DIAGNOSIS — H6123 Impacted cerumen, bilateral: Secondary | ICD-10-CM | POA: Diagnosis not present

## 2022-07-01 DIAGNOSIS — G4733 Obstructive sleep apnea (adult) (pediatric): Secondary | ICD-10-CM

## 2022-07-01 DIAGNOSIS — R5383 Other fatigue: Secondary | ICD-10-CM | POA: Diagnosis not present

## 2022-07-01 NOTE — Patient Instructions (Signed)
Keep follow up as planned in February and follow up with Dr. Annamaria Boots for sleep apnea.   Return to the clinic or go to the nearest emergency room if any of your symptoms worsen or new symptoms occur.

## 2022-07-01 NOTE — Progress Notes (Signed)
Subjective:  Patient ID: Nicholas Potts, male    DOB: 01-Sep-1960  Age: 62 y.o. MRN: [id number]  CC:  Chief Complaint  Patient presents with   Fatigue    Pt notes he is doing about the same no other sxs just seems very tired all the time    Ear Fullness    Pt notes debrox did not work and would like to see if we can clean out his ears while in office today     HPI Nicholas Potts presents for  Follow up Here with interpreter.   Fatigue Discussed at his January 3 visit.  History of OSA and hypersomnolence.  Treated with BiPAP and Provigil to help with sleepiness, with only 25 to 30% better sleep, still some persistent fatigue.  He did have a history of stress-induced cardiomyopathy and hypertension, seen by cardiology previously.  Labs obtained last visit.  Triglycerides were elevated but other lipids were improved.  A1c still in prediabetes range.  Remainder of CMP looked okay, prostate test was normal, CBC was normal. Vitamin D low in 2022, 25.77 at that time, TSH has been normal, most recently checked in November 2022. Most recent echo in April 2023 was improved with EF 60 to 65%, up from previous 55 to 60%.  No evidence of pulmonary hypertension.  No apparent cardiac causes for fatigue.  Only started bipap a month ago. Plans to follow up with Dr. Annamaria Boots next month.  Same fatigue as last year when discussed with cardiology. No changes and has physical job.  No chest pain/dyspnea.   Cerumen impaction Noted excess cerumen with some impaction at his last visit January 3.  Recommend initial trial of Debrox.  Tried that at home 2-3 times, without effect. Would like to have cerumen lavage today if possible. Risks of lavage discussed including possible perforation of tm or canal injury, option of ENT eval. Consent given for lavage.   History Patient Active Problem List   Diagnosis Date Noted   Morbid obesity with BMI of 40.0-44.9, adult ([place]) 09/10/2021   Fatigue 09/10/2021    History of colonic polyps    Polyp of ascending colon    Polyp of transverse colon    Polyp of cecum    Polyp of descending colon    Polyp of sigmoid colon    Hypersomnia 11/29/2017   Other headache syndrome 05/05/2017   Vertigo 05/05/2017   IFG (impaired fasting glucose) 08/21/2016   Obesity (BMI 30-39.9) 06/23/2013   Stress-induced cardiomyopathy -- essentially resolved    CVA (cerebral infarction)    OSA (obstructive sleep apnea)    Hyperlipidemia    Physical deconditioning 01/24/2013   Tinea pedis 01/18/2013   Essential hypertension 01/13/2013   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[place]) 01/11/2013   ARF (acute renal failure) with tubular necrosis requiring HD 01/11/2013   Anemia 01/11/2013   Thrombocytopenia, unspecified ([place]) 01/11/2013   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 12/29/2012   Past Medical History:  Diagnosis Date   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[place]) 2014   post op    ARF (acute respiratory failure) ([place]) 2014   Blood dyscrasia    Chronic kidney disease 4098   ARF    Complication of anesthesia 2014   - post op - in combination of pain pills-  Cardiac and Respiratory Arrest- prior to sleep apnea diagonosis   Constipation    CVA (cerebral infarction)    Hyperlipidemia    Hypertension    OSA (  obstructive sleep apnea)    Pre-diabetes    Short-term memory loss    Sleep apnea    Stress-induced cardiomyopathy -- RESOLVED    Post-op Narcotic induced Acute Hypoxic respiratory failure with respiratory and PEA cardiac arrest following accidental narcotic overdose; initial [place] 20-20%, now improved to 55%   Was supported with Impella Cardiac Cath - non-obstructive CAD ; 1 month after event - EF up from 25% to 55-60% - stable x 5 yrs   Thrombocytopenia ([place]) 2014   Past Surgical History:  Procedure Laterality Date   ACHILLES TENDON REPAIR     BIOPSY  08/01/2019   Procedure: BIOPSY;  Surgeon: Mauri Pole, MD;   Location: [facility];  Service: Endoscopy;;   BIOPSY  08/05/2020   Procedure: BIOPSY;  Surgeon: Mauri Pole, MD;  Location: [facility];  Service: Endoscopy;;   CARDIAC CATHETERIZATION  12/29/2012   RN LHC --> Impella. for severe shock with low EF. Mild to moderate RV pressure elevation. Normal coronary arteries. Cardiac Output 2.5, Index 1.46.   COLONOSCOPY WITH PROPOFOL N/A 10/30/2015   Procedure: COLONOSCOPY WITH PROPOFOL;  Surgeon: Mauri Pole, MD;  Location: [facility];  Service: Endoscopy;  Laterality: N/A;   COLONOSCOPY WITH PROPOFOL N/A 08/01/2019   Procedure: COLONOSCOPY WITH PROPOFOL;  Surgeon: Mauri Pole, MD;  Location: [facility];  Service: Endoscopy;  Laterality: N/A;   COLONOSCOPY WITH PROPOFOL N/A 08/05/2020   Procedure: COLONOSCOPY WITH PROPOFOL;  Surgeon: Mauri Pole, MD;  Location: [facility];  Service: Endoscopy;  Laterality: N/A;   INSERTION OF DIALYSIS CATHETER Right 01/09/2013   Procedure: INSERTION OF DIALYSIS CATHETER;  Surgeon: Angelia Mould, MD;  Location: [facility];  Service: Vascular;  Laterality: Right;   LEFT AND RIGHT HEART CATHETERIZATION WITH CORONARY ANGIOGRAM  12/29/2012   Procedure: LEFT AND RIGHT HEART CATHETERIZATION WITH CORONARY ANGIOGRAM;  Surgeon: Sherren Mocha, MD;  Location: [facility] CATH LAB;  Service: Cardiovascular;;   POLYPECTOMY  08/01/2019   Procedure: POLYPECTOMY;  Surgeon: Mauri Pole, MD;  [facility];  Service: Endoscopy;;   POLYPECTOMY  08/05/2020   Procedure: POLYPECTOMY;  Surgeon: Mauri Pole, MD;  [facility];  Service: Endoscopy;;   TRANSTHORACIC ECHOCARDIOGRAM  12/29/2012   EF 25% with periapical HK/AKA.  --> Followup echo 7/25 -- EF of 35%   TRANSTHORACIC ECHOCARDIOGRAM  01/2013 ; 07/2015   a) EF 55-60%. No regional WMA. Grade 1 diastolic dysfunction. Mild LA dilatation.; b) Normal LV size with mild LV hypertrophy. EF 55-60%. Normal RV   TRANSTHORACIC  ECHOCARDIOGRAM  03/2018   EF 55-60%.  NO RWMA. Normal valves   Allergies  Allergen Reactions   Oxycodone     Oxygen levels drop   Prior to Admission medications   Medication Sig Start Date End Date Taking? Authorizing Provider  aspirin EC 81 MG tablet Take 1 tablet (81 mg total) by mouth daily. 05/13/17  Yes Meng, Isaac Laud, PA  atorvastatin (LIPITOR) 10 MG tablet TAKE 1 TABLET(10 MG) BY MOUTH DAILY 06/24/22  Yes [name], MD  azelastine (OPTIVAR) 0.05 % ophthalmic solution Place 1 drop into both eyes 2 (two) times daily. 05/12/21  Yes [name], MD  modafinil (PROVIGIL) 100 MG tablet 1 each morning as needed for alertness. 01/19/22  Yes Young, Tarri Fuller D, MD  Multiple Vitamin (MULTIVITAMIN WITH MINERALS) TABS tablet Take 1 tablet by mouth daily.   Yes [provider]   Social History   Socioeconomic History   Marital status: Married  Spouse name: Not on file   Number of children: Not on file   Years of education: Not on file   Highest education level: Not on file  Occupational History   Not on file  Tobacco Use   Smoking status: Former    Types: Cigarettes    Quit date: 06/20/1986    Years since quitting: 36.0   Smokeless tobacco: Never  Vaping Use   Vaping Use: Never used  Substance and Sexual Activity   Alcohol use: Yes    Comment: History of heavy use but only social at present.  10/29/15 - "a beer every week or so"   Drug use: No    Comment: Prior cocaine use but none since 2000   Sexual activity: Not Currently  Other Topics Concern   Not on file  Social History Narrative   He is widowed for 13 years, and has 2 daughters: [name] & [address] [place], and one son Blaze Nylund. the other history is daughter who is here with him today.    He works for [employer] as a [employer].   He quit smoking in 1988. He does drink alcohol socially, but he does have a history of heavy use in the past. He also has a prior  history of cocaine use but none since 2000.   Social Determinants of Health   Financial Resource Strain: Not on file  Food Insecurity: Not on file  Transportation Needs: Not on file  Physical Activity: Not on file  Stress: Not on file  Social Connections: Not on file  Intimate Partner Violence: Not on file    Review of Systems Per HPI.   Objective:   Vitals:   07/01/22 1547  BP: 128/70  Pulse: 86  Temp: 98 F (36.7 C)  TempSrc: Temporal  SpO2: 97%  Weight: 267 lb 3.2 oz (121.2 kg)  Height: 5' 5.5" (1.664 m)     Physical Exam Vitals reviewed.  Constitutional:      Appearance: He is obese. He is not ill-appearing or diaphoretic.  Neurological:     Mental Status: He is alert.      Bilateral cerumen lavage for impaction after consent as above, we discussed potential risks, benefits and alternatives.  Performed by CMA, exam after lavage without sign of TM injury or canal injury, much improved hearing with removal of cerumen bilaterally.  Assessment & Plan:  Nicholas Potts is a 62 y.o. male . Bilateral impacted cerumen  -Removed as above without complications.  Failed home attempts with Debrox.  Bilateral procedure.  Fatigue, unspecified type OSA (obstructive sleep apnea)  -Persistent fatigue, similar to last year when he was seen by cardiology and improved EF on echo.  Has only been on BiPAP for the past month.  Still may be related to his hypersomnolence, OSA.  On Provigil as above.  Plans on follow-up with sleep specialist.  Recent labs were reassuring.  Denies cardiac symptoms.  No further testing or labs at this time but RTC precautions given.  Has follow-up next month for physical.   No orders of the defined types were placed in this encounter.  Patient Instructions  Keep follow up as planned in February and follow up with Dr. Annamaria Boots for sleep apnea.   Return to the clinic or go to the nearest emergency room if any of your symptoms worsen or new symptoms  occur.     Signed,   Merri Ray, MD [facility],  [facility] 07/01/22 5:12 PM

## 2022-07-02 NOTE — Telephone Encounter (Signed)
nfn

## 2022-07-10 ENCOUNTER — Encounter: Payer: Self-pay | Admitting: Urology

## 2022-07-10 ENCOUNTER — Ambulatory Visit: Payer: BC Managed Care – PPO | Admitting: Urology

## 2022-07-10 VITALS — BP 122/76 | HR 79 | Ht 65.0 in | Wt 265.0 lb

## 2022-07-10 DIAGNOSIS — R3915 Urgency of urination: Secondary | ICD-10-CM | POA: Diagnosis not present

## 2022-07-10 DIAGNOSIS — R350 Frequency of micturition: Secondary | ICD-10-CM | POA: Diagnosis not present

## 2022-07-10 LAB — URINALYSIS
Bilirubin, UA: NEGATIVE
Blood, UA: NEGATIVE
Glucose, UA: 100 mg/dL — AB
Ketones, UA: NEGATIVE
Leukocytes, UA: NEGATIVE
Nitrite, UA: NEGATIVE
Protein, UA: POSITIVE — AB
Spec Grav, UA: >=1.03 — AB (ref 1.010–1.025)
Urobilinogen, UA: 0.2 E.U./dL
pH, UA: 6.5 (ref 5.0–8.0)

## 2022-07-10 LAB — BLADDER SCAN AMB NON-IMAGING

## 2022-07-10 MED ORDER — MIRABEGRON ER 25 MG PO TB24: 25.0000 mg | ORAL_TABLET | Freq: Every day | ORAL | 0 refills | Status: DC

## 2022-07-10 NOTE — Progress Notes (Signed)
Assessment: 1. Urinary frequency   2. Urinary urgency     Plan: I personally reviewed the patient's chart including provider notes, lab results. Diagnosis and management of overactive bladder symptoms discussed including avoidance of dietary irritants, behavioral modification, medical therapy, chemodenervation, and neuromodulation. Trial of Myrbetriq 25 mg daily.  Samples given.  Use and side effects discussed Return to office in 1 month.  Chief Complaint:  Chief Complaint  Patient presents with   Urinary Frequency    History of Present Illness:  Nicholas Potts is a 62 y.o. male who is seen in consultation from [name], MD for evaluation of lower urinary symptoms.  His symptoms have been present for at least 1 year.  He reports urinary frequency voiding 8-10 times per day.  He also has urgency and occasional urge incontinence.  He has noted some slight dysuria at the end of his void.  He does not have any nocturia.  He reports that he voids with a good stream and feels like he empties his bladder completely.  No gross hematuria.  No history of UTIs.  He has a remote history of gonorrhea 30 years ago.  He has not tried any medical therapy for his symptoms. IPSS = 5 today.  PSA 0.74 from 1/24.  The patient's daughter interpreted during visit.  Past Medical History:  Past Medical History:  Diagnosis Date   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[place]) 2014   post op    ARF (acute respiratory failure) ([place]) 2014   Blood dyscrasia    Chronic kidney disease 4098   ARF    Complication of anesthesia 2014   - post op - in combination of pain pills-  Cardiac and Respiratory Arrest- prior to sleep apnea diagonosis   Constipation    CVA (cerebral infarction)    Hyperlipidemia    Hypertension    OSA (obstructive sleep apnea)    Pre-diabetes    Short-term memory loss    Sleep apnea    Stress-induced cardiomyopathy -- RESOLVED    Post-op Narcotic induced Acute Hypoxic respiratory  failure with respiratory and PEA cardiac arrest following accidental narcotic overdose; initial [place] 20-20%, now improved to 55%   Was supported with Impella Cardiac Cath - non-obstructive CAD ; 1 month after event - EF up from 25% to 55-60% - stable x 5 yrs   Thrombocytopenia ([place]) 2014    Past Surgical History:  Past Surgical History:  Procedure Laterality Date   ACHILLES TENDON REPAIR     BIOPSY  08/01/2019   Procedure: BIOPSY;  Surgeon: Mauri Pole, MD;  [facility]: [facility];  Service: Endoscopy;;   BIOPSY  08/05/2020   Procedure: BIOPSY;  Surgeon: Mauri Pole, MD;  [facility]: [facility];  Service: Endoscopy;;   CARDIAC CATHETERIZATION  12/29/2012   RN LHC --> Impella. for severe shock with low EF. Mild to moderate RV pressure elevation. Normal coronary arteries. Cardiac Output 2.5, Index 1.46.   COLONOSCOPY WITH PROPOFOL N/A 10/30/2015   Procedure: COLONOSCOPY WITH PROPOFOL;  Surgeon: Mauri Pole, MD;  Location: [facility];  Service: Endoscopy;  Laterality: N/A;   COLONOSCOPY WITH PROPOFOL N/A 08/01/2019   Procedure: COLONOSCOPY WITH PROPOFOL;  Surgeon: Mauri Pole, MD;  [facility];  Service: Endoscopy;  Laterality: N/A;   COLONOSCOPY WITH PROPOFOL N/A 08/05/2020   Procedure: COLONOSCOPY WITH PROPOFOL;  Surgeon: Mauri Pole, MD;  [facility];  Service: Endoscopy;  Laterality: N/A;   INSERTION OF DIALYSIS CATHETER Right 01/09/2013  Procedure: INSERTION OF DIALYSIS CATHETER;  Surgeon: Angelia Mould, MD;  Location: [facility];  Service: Vascular;  Laterality: Right;   LEFT AND RIGHT HEART CATHETERIZATION WITH CORONARY ANGIOGRAM  12/29/2012   Procedure: LEFT AND RIGHT HEART CATHETERIZATION WITH CORONARY ANGIOGRAM;  Surgeon: Sherren Mocha, MD;  Location: [facility] CATH LAB;  Service: Cardiovascular;;   POLYPECTOMY  08/01/2019   Procedure: POLYPECTOMY;  Surgeon: Mauri Pole, MD;  [facility];  Service:  Endoscopy;;   POLYPECTOMY  08/05/2020   Procedure: POLYPECTOMY;  Surgeon: Mauri Pole, MD;  [facility];  Service: Endoscopy;;   TRANSTHORACIC ECHOCARDIOGRAM  12/29/2012   EF 25% with periapical HK/AKA.  --> Followup echo 7/25 -- EF of 35%   TRANSTHORACIC ECHOCARDIOGRAM  01/2013 ; 07/2015   a) EF 55-60%. No regional WMA. Grade 1 diastolic dysfunction. Mild LA dilatation.; b) Normal LV size with mild LV hypertrophy. EF 55-60%. Normal RV   TRANSTHORACIC ECHOCARDIOGRAM  03/2018   EF 55-60%.  NO RWMA. Normal valves    Allergies:  Allergies  Allergen Reactions   Oxycodone     Oxygen levels drop    Family History:  Family History  Problem Relation Age of Onset   Cancer Mother        Liver cancer   Cancer Father        Leukemia   Cancer Maternal Grandmother        Liver cancer   Cancer Paternal Grandmother        Colon cancer   Colon cancer Neg Hx     Social History:  Social History   Tobacco Use   Smoking status: Former    Types: Cigarettes    Quit date: 06/20/1986    Years since quitting: 36.0   Smokeless tobacco: Never  Vaping Use   Vaping Use: Never used  Substance Use Topics   Alcohol use: Yes    Comment: History of heavy use but only social at present.  10/29/15 - "a beer every week or so"   Drug use: No    Comment: Prior cocaine use but none since 2000    Review of symptoms:  Constitutional:  Negative for unexplained weight loss, night sweats, fever, chills ENT:  Negative for nose bleeds, sinus pain, painful swallowing CV:  Negative for chest pain, shortness of breath, exercise intolerance, palpitations, loss of consciousness Resp:  Negative for cough, wheezing, shortness of breath GI:  Negative for nausea, vomiting, diarrhea, bloody stools GU:  Positives noted in HPI; otherwise negative for gross hematuria, dysuria Neuro:  Negative for seizures, poor balance, limb weakness, slurred speech Psych:  Negative for lack of energy, depression,  anxiety Endocrine:  Negative for polydipsia, polyuria, symptoms of hypoglycemia (dizziness, hunger, sweating) Hematologic:  Negative for anemia, purpura, petechia, prolonged or excessive bleeding, use of anticoagulants  Allergic:  Negative for difficulty breathing or choking as a result of exposure to anything; no shellfish allergy; no allergic response (rash/itch) to materials, foods  Physical exam: BP 122/76   Pulse 79   Ht '5\' 5"'$  (1.651 m)   Wt 265 lb (120.2 kg)   BMI 44.10 kg/m  GENERAL APPEARANCE:  Well appearing, well developed, well nourished, NAD HEENT: Atraumatic, Normocephalic, oropharynx clear. NECK: Supple without lymphadenopathy or thyromegaly. LUNGS: Clear to auscultation bilaterally. HEART: Regular Rate and Rhythm without murmurs, gallops, or rubs. ABDOMEN: Soft, non-tender, No Masses. EXTREMITIES: Moves all extremities well.  Without clubbing, cyanosis, or edema. NEUROLOGIC:  Alert and oriented x 3, normal gait, CN  II-XII grossly intact.  MENTAL STATUS:  Appropriate. BACK:  Non-tender to palpation.  No CVAT SKIN:  Warm, dry and intact.   GU: Penis:  uncircumcised Meatus: Normal Scrotum: normal, no masses Testis: normal without masses bilateral Epididymis: normal Prostate: 40 g, NT, no nodules Rectum: Normal tone,  no masses or tenderness  Results: U/A dipstick: Trace glucose, 1+ protein  PVR: 0 ml

## 2022-07-29 ENCOUNTER — Ambulatory Visit (INDEPENDENT_AMBULATORY_CARE_PROVIDER_SITE_OTHER): Payer: BC Managed Care – PPO | Admitting: Family Medicine

## 2022-07-29 ENCOUNTER — Encounter: Payer: Self-pay | Admitting: Family Medicine

## 2022-07-29 VITALS — BP 122/78 | HR 96 | Temp 99.2°F | Ht 65.0 in | Wt 270.0 lb

## 2022-07-29 DIAGNOSIS — Z23 Encounter for immunization: Secondary | ICD-10-CM | POA: Diagnosis not present

## 2022-07-29 DIAGNOSIS — Z2911 Encounter for prophylactic immunotherapy for respiratory syncytial virus (RSV): Secondary | ICD-10-CM

## 2022-07-29 DIAGNOSIS — Z0000 Encounter for general adult medical examination without abnormal findings: Secondary | ICD-10-CM

## 2022-07-29 DIAGNOSIS — R7303 Prediabetes: Secondary | ICD-10-CM

## 2022-07-29 NOTE — Patient Instructions (Addendum)
Repeat shingles vaccine in 2-6 months.  Colonoscopy next year.  Continue exercise most days per week with goal of 150 minutes per week.  Take care!  Prediabetes Eating Plan Prediabetes is a condition that causes blood sugar (glucose) levels to be higher than normal. This increases the risk for developing type 2 diabetes (type 2 diabetes mellitus). Working with a health care provider or nutrition specialist (dietitian) to make diet and lifestyle changes can help prevent the onset of diabetes. These changes may help you: Control your blood glucose levels. Improve your cholesterol levels. Manage your blood pressure. What are tips for following this plan? Reading food labels Read food labels to check the amount of fat, salt (sodium), and sugar in prepackaged foods. Avoid foods that have: Saturated fats. Trans fats. Added sugars. Avoid foods that have more than 300 milligrams (mg) of sodium per serving. Limit your sodium intake to less than 2,300 mg each day. Shopping Avoid buying pre-made and processed foods. Avoid buying drinks with added sugar. Cooking Cook with olive oil. Do not use butter, lard, or ghee. Bake, broil, grill, steam, or boil foods. Avoid frying. Meal planning  Work with your dietitian to create an eating plan that is right for you. This may include tracking how many calories you take in each day. Use a food diary, notebook, or mobile application to track what you eat at each meal. Consider following a Mediterranean diet. This includes: Eating several servings of fresh fruits and vegetables each day. Eating fish at least twice a week. Eating one serving each day of whole grains, beans, nuts, and seeds. Using olive oil instead of other fats. Limiting alcohol. Limiting red meat. Using nonfat or low-fat dairy products. Consider following a plant-based diet. This includes dietary choices that focus on eating mostly vegetables and fruit, grains, beans, nuts, and seeds. If  you have high blood pressure, you may need to limit your sodium intake or follow a diet such as the DASH (Dietary Approaches to Stop Hypertension) eating plan. The DASH diet aims to lower high blood pressure. Lifestyle Set weight loss goals with help from your health care team. It is recommended that most people with prediabetes lose 7% of their body weight. Exercise for at least 30 minutes 5 or more days a week. Attend a support group or seek support from a mental health counselor. Take over-the-counter and prescription medicines only as told by your health care provider. What foods are recommended? Fruits Berries. Bananas. Apples. Oranges. Grapes. Papaya. Mango. Pomegranate. Kiwi. Grapefruit. Cherries. Vegetables Lettuce. Spinach. Peas. Beets. Cauliflower. Cabbage. Broccoli. Carrots. Tomatoes. Squash. Eggplant. Herbs. Peppers. Onions. Cucumbers. Brussels sprouts. Grains Whole grains, such as whole-wheat or whole-grain breads, crackers, cereals, and pasta. Unsweetened oatmeal. Bulgur. Barley. Quinoa. Brown rice. Corn or whole-wheat flour tortillas or taco shells. Meats and other proteins Seafood. Poultry without skin. Lean cuts of pork and beef. Tofu. Eggs. Nuts. Beans. Dairy Low-fat or fat-free dairy products, such as yogurt, cottage cheese, and cheese. Beverages Water. Tea. Coffee. Sugar-free or diet soda. Seltzer water. Low-fat or nonfat milk. Milk alternatives, such as soy or almond milk. Fats and oils Olive oil. Canola oil. Sunflower oil. Grapeseed oil. Avocado. Walnuts. Sweets and desserts Sugar-free or low-fat pudding. Sugar-free or low-fat ice cream and other frozen treats. Seasonings and condiments Herbs. Sodium-free spices. Mustard. Relish. Low-salt, low-sugar ketchup. Low-salt, low-sugar barbecue sauce. Low-fat or fat-free mayonnaise. The items listed above may not be a complete list of recommended foods and beverages. Contact a dietitian for more  information. What foods are not  recommended? Fruits Fruits canned with syrup. Vegetables Canned vegetables. Frozen vegetables with butter or cream sauce. Grains Refined white flour and flour products, such as bread, pasta, snack foods, and cereals. Meats and other proteins Fatty cuts of meat. Poultry with skin. Breaded or fried meat. Processed meats. Dairy Full-fat yogurt, cheese, or milk. Beverages Sweetened drinks, such as iced tea and soda. Fats and oils Butter. Lard. Ghee. Sweets and desserts Baked goods, such as cake, cupcakes, pastries, cookies, and cheesecake. Seasonings and condiments Spice mixes with added salt. Ketchup. Barbecue sauce. Mayonnaise. The items listed above may not be a complete list of foods and beverages that are not recommended. Contact a dietitian for more information. Where to find more information American Diabetes Association: www.diabetes.org Summary You may need to make diet and lifestyle changes to help prevent the onset of diabetes. These changes can help you control blood sugar, improve cholesterol levels, and manage blood pressure. Set weight loss goals with help from your health care team. It is recommended that most people with prediabetes lose 7% of their body weight. Consider following a Mediterranean diet. This includes eating plenty of fresh fruits and vegetables, whole grains, beans, nuts, seeds, fish, and low-fat dairy, and using olive oil instead of other fats. This information is not intended to replace advice given to you by your health care provider. Make sure you discuss any questions you have with your health care provider. Document Revised: 08/24/2019 Document Reviewed: 08/24/2019 Elsevier Patient Education  2023 [employer]    Preventive Care 21-20 Years Old, Male Preventive care refers to lifestyle choices and visits with your health care provider that can promote health and wellness. Preventive care visits are also called wellness exams. What can I expect  for my preventive care visit? Counseling During your preventive care visit, your health care provider may ask about your: Medical history, including: Past medical problems. Family medical history. Current health, including: Emotional well-being. Home life and relationship well-being. Sexual activity. Lifestyle, including: Alcohol, nicotine or tobacco, and drug use. Access to firearms. Diet, exercise, and sleep habits. Safety issues such as seatbelt and bike helmet use. Sunscreen use. Work and work Statistician. Physical exam Your health care provider will check your: Height and weight. These may be used to calculate your BMI (body mass index). BMI is a measurement that tells if you are at a healthy weight. Waist circumference. This measures the distance around your waistline. This measurement also tells if you are at a healthy weight and may help predict your risk of certain diseases, such as type 2 diabetes and high blood pressure. Heart rate and blood pressure. Body temperature. Skin for abnormal spots. What immunizations do I need?  Vaccines are usually given at various ages, according to a schedule. Your health care provider will recommend vaccines for you based on your age, medical history, and lifestyle or other factors, such as travel or where you work. What tests do I need? Screening Your health care provider may recommend screening tests for certain conditions. This may include: Lipid and cholesterol levels. Diabetes screening. This is done by checking your blood sugar (glucose) after you have not eaten for a while (fasting). Hepatitis B test. Hepatitis C test. HIV (human immunodeficiency virus) test. STI (sexually transmitted infection) testing, if you are at risk. Lung cancer screening. Prostate cancer screening. Colorectal cancer screening. Talk with your health care provider about your test results, treatment options, and if necessary, the need for more  tests.  Follow these instructions at home: Eating and drinking  Eat a diet that includes fresh fruits and vegetables, whole grains, lean protein, and low-fat dairy products. Take vitamin and mineral supplements as recommended by your health care provider. Do not drink alcohol if your health care provider tells you not to drink. If you drink alcohol: Limit how much you have to 0-2 drinks a day. Know how much alcohol is in your drink. In the [place] one drink equals one 12 oz bottle of beer (355 mL), one 5 oz glass of wine (148 mL), or one 1 oz glass of hard liquor (44 mL). Lifestyle Brush your teeth every morning and night with fluoride toothpaste. Floss one time each day. Exercise for at least 30 minutes 5 or more days each week. Do not use any products that contain nicotine or tobacco. These products include cigarettes, chewing tobacco, and vaping devices, such as e-cigarettes. If you need help quitting, ask your health care provider. Do not use drugs. If you are sexually active, practice safe sex. Use a condom or other form of protection to prevent STIs. Take aspirin only as told by your health care provider. Make sure that you understand how much to take and what form to take. Work with your health care provider to find out whether it is safe and beneficial for you to take aspirin daily. Find healthy ways to manage stress, such as: Meditation, yoga, or listening to music. Journaling. Talking to a trusted person. Spending time with friends and family. Minimize exposure to UV radiation to reduce your risk of skin cancer. Safety Always wear your seat belt while driving or riding in a vehicle. Do not drive: If you have been drinking alcohol. Do not ride with someone who has been drinking. When you are tired or distracted. While texting. If you have been using any mind-altering substances or drugs. Wear a helmet and other protective equipment during sports activities. If you have firearms  in your house, make sure you follow all gun safety procedures. What's next? Go to your health care provider once a year for an annual wellness visit. Ask your health care provider how often you should have your eyes and teeth checked. Stay up to date on all vaccines. This information is not intended to replace advice given to you by your health care provider. Make sure you discuss any questions you have with your health care provider. Document Revised: 11/20/2020 Document Reviewed: 11/20/2020 [employer] Patient Education  [employer].

## 2022-07-29 NOTE — Progress Notes (Addendum)
Subjective:  Patient ID: Nicholas Potts, male    DOB: 08-Jun-1961  Age: 62 y.o. MRN: [id number]  CC:  Chief Complaint  Patient presents with   Annual Exam    Pt notes no questions would like to get shingles vaccine today discuss RSV   Here with interpreter.   HPI Nicholas Potts presents for Annual Exam See recent visits. Labs in January. A1c 6.2, elevated triglycerides, but improved lipids.       04/07/2022   10:50 AM 06/16/2021    9:21 AM 05/28/2021    2:11 PM 05/28/2021    2:10 PM 04/24/2021   10:36 AM  Depression screen PHQ 2/9  Decreased Interest 0 '1 1 1 '$ 0  Down, Depressed, Hopeless 0 '1 1 1 '$ 0  PHQ - 2 Score 0 '2 2 2 '$ 0  Altered sleeping 0 0 0    Tired, decreased energy 0 3 3    Change in appetite 0 2 1    Feeling bad or failure about yourself  0 0 0    Trouble concentrating 0 0 1    Moving slowly or fidgety/restless 0 0 0    Suicidal thoughts 0 0 0    PHQ-9 Score 0 7 7    Difficult doing work/chores Not difficult at all  Not difficult at all      Health Maintenance  Topic Date Due   Zoster Vaccines- Shingrix (1 of 2) Never done   INFLUENZA VACCINE  09/06/2022 (Originally 01/06/2022)   DTaP/Tdap/Td (2 - Td or Tdap) 10/22/2027   COLONOSCOPY (Pts 45-75yr Insurance coverage will need to be confirmed)  08/05/2030   Hepatitis C Screening  Completed   HIV Screening  Completed   HPV VACCINES  Aged Out   COVID-19 Vaccine  Discontinued   Colonoscopy 2022, repeat 3 years. Dr. NSilverio Decamp  Prostate: normal PSA in January.  Lab Results  Component Value Date   PSA1 0.7 05/20/2020   PSA1 0.7 10/21/2017   PSA1 0.7 02/05/2017   PSA 0.74 06/12/2022   PSA 0.73 05/28/2021   PSA 0.82 06/29/2015      Immunization History  Administered Date(s) Administered   Influenza,inj,Quad PF,6+ Mos 06/29/2015, 02/22/2019   Influenza-Unspecified 04/22/2021   PFIZER(Purple Top)SARS-COV-2 Vaccination 08/16/2019, 09/06/2019, 04/23/2020   Tdap 10/21/2017  Shingrix today. Flu vaccine  at [facility].  Arexvy discussed - requests today.   No results found. Reading glasses. Optho eval 2 years ago - follow up recommended.   Dental: every 3 months with recent visit.   Alcohol: 2-3 beers per week.    Tobacco: none, no vapes.  Exercise/obesity: Body mass index is 44.93 kg/m. Wt Readings from Last 3 Encounters:  07/29/22 270 lb (122.5 kg)  07/10/22 265 lb (120.2 kg)  07/01/22 267 lb 3.2 oz (121.2 kg)  Riding bike 371m per day.  No soda/sweet tea. Rare fast food - once per month. Usually cooking at home.  Would like to see dietician.     History Patient Active Problem List   Diagnosis Date Noted   Urinary urgency 07/10/2022   Urinary frequency 07/10/2022   Morbid obesity with BMI of 40.0-44.9, adult [place]04/10/2021   Fatigue 09/10/2021   History of colonic polyps    Polyp of ascending colon    Polyp of transverse colon    Polyp of cecum    Polyp of descending colon    Polyp of sigmoid colon    Hypersomnia 11/29/2017   Other headache syndrome 05/05/2017   Vertigo 05/05/2017  IFG (impaired fasting glucose) 08/21/2016   Obesity (BMI 30-39.9) 06/23/2013   Stress-induced cardiomyopathy -- essentially resolved    CVA (cerebral infarction)    OSA (obstructive sleep apnea)    Hyperlipidemia    Physical deconditioning 01/24/2013   Tinea pedis 01/18/2013   Essential hypertension 01/13/2013   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[place]) 01/11/2013   ARF (acute renal failure) with tubular necrosis requiring HD 01/11/2013   Anemia 01/11/2013   Thrombocytopenia, unspecified ([place]) 01/11/2013   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 12/29/2012   Past Medical History:  Diagnosis Date   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[place]) 2014   post op    ARF (acute respiratory failure) ([place]) 2014   Blood dyscrasia    Chronic kidney disease 123456   ARF    Complication of anesthesia 2014   - post op - in combination of pain pills-  Cardiac and  Respiratory Arrest- prior to sleep apnea diagonosis   Constipation    CVA (cerebral infarction)    Hyperlipidemia    Hypertension    OSA (obstructive sleep apnea)    Pre-diabetes    Short-term memory loss    Sleep apnea    Stress-induced cardiomyopathy -- RESOLVED    Post-op Narcotic induced Acute Hypoxic respiratory failure with respiratory and PEA cardiac arrest following accidental narcotic overdose; initial [place] 20-20%, now improved to 55%   Was supported with Impella Cardiac Cath - non-obstructive CAD ; 1 month after event - EF up from 25% to 55-60% - stable x 5 yrs   Thrombocytopenia ([place]) 2014   Past Surgical History:  Procedure Laterality Date   ACHILLES TENDON REPAIR     BIOPSY  08/01/2019   Procedure: BIOPSY;  Surgeon: Mauri Pole, MD;  Location: [facility];  Service: Endoscopy;;   BIOPSY  08/05/2020   Procedure: BIOPSY;  Surgeon: Mauri Pole, MD;  Location: [facility];  Service: Endoscopy;;   CARDIAC CATHETERIZATION  12/29/2012   RN LHC --> Impella. for severe shock with low EF. Mild to moderate RV pressure elevation. Normal coronary arteries. Cardiac Output 2.5, Index 1.46.   COLONOSCOPY WITH PROPOFOL N/A 10/30/2015   Procedure: COLONOSCOPY WITH PROPOFOL;  Surgeon: Mauri Pole, MD;  Location: [facility];  Service: Endoscopy;  Laterality: N/A;   COLONOSCOPY WITH PROPOFOL N/A 08/01/2019   Procedure: COLONOSCOPY WITH PROPOFOL;  Surgeon: Mauri Pole, MD;  Location: [facility];  Service: Endoscopy;  Laterality: N/A;   COLONOSCOPY WITH PROPOFOL N/A 08/05/2020   Procedure: COLONOSCOPY WITH PROPOFOL;  Surgeon: Mauri Pole, MD;  Location: [facility];  Service: Endoscopy;  Laterality: N/A;   INSERTION OF DIALYSIS CATHETER Right 01/09/2013   Procedure: INSERTION OF DIALYSIS CATHETER;  Surgeon: Angelia Mould, MD;  Location: [facility];  Service: Vascular;  Laterality: Right;   LEFT AND RIGHT HEART CATHETERIZATION WITH CORONARY ANGIOGRAM   12/29/2012   Procedure: LEFT AND RIGHT HEART CATHETERIZATION WITH CORONARY ANGIOGRAM;  Surgeon: Sherren Mocha, MD;  Location: [facility];  Service: Cardiovascular;;   POLYPECTOMY  08/01/2019   Procedure: POLYPECTOMY;  Surgeon: Mauri Pole, MD;  Location: [facility];  Service: Endoscopy;;   POLYPECTOMY  08/05/2020   Procedure: POLYPECTOMY;  Surgeon: Mauri Pole, MD;  Location: [facility];  Service: Endoscopy;;   TRANSTHORACIC ECHOCARDIOGRAM  12/29/2012   EF 25% with periapical HK/AKA.  --> Followup echo 7/25 -- EF of 35%   TRANSTHORACIC ECHOCARDIOGRAM  01/2013 ; 07/2015   a) EF 55-60%. No  regional WMA. Grade 1 diastolic dysfunction. Mild LA dilatation.; b) Normal LV size with mild LV hypertrophy. EF 55-60%. Normal RV   TRANSTHORACIC ECHOCARDIOGRAM  03/2018   EF 55-60%.  NO RWMA. Normal valves   Allergies  Allergen Reactions   Oxycodone     Oxygen levels drop   Prior to Admission medications   Medication Sig Start Date End Date Taking? Authorizing Provider  aspirin EC 81 MG tablet Take 1 tablet (81 mg total) by mouth daily. 05/13/17  Yes Meng, Isaac Laud, PA  atorvastatin (LIPITOR) 10 MG tablet TAKE 1 TABLET(10 MG) BY MOUTH DAILY 06/24/22  Yes [name], MD  azelastine (OPTIVAR) 0.05 % ophthalmic solution Place 1 drop into both eyes 2 (two) times daily. 05/12/21  Yes [name], MD  mirabegron ER (MYRBETRIQ) 25 MG TB24 tablet Take 1 tablet (25 mg total) by mouth daily. 07/10/22  Yes Stoneking, Reece Leader., MD  modafinil (PROVIGIL) 100 MG tablet 1 each morning as needed for alertness. 01/19/22  Yes Young, Tarri Fuller D, MD  Multiple Vitamin (MULTIVITAMIN WITH MINERALS) TABS tablet Take 1 tablet by mouth daily.   Yes [provider]   Social History   Socioeconomic History   Marital status: Married    Spouse name: Not on file   Number of children: Not on file   Years of education: Not on file   Highest education level: Not on file  Occupational History   Not  on file  Tobacco Use   Smoking status: Former    Types: Cigarettes    Quit date: 06/20/1986    Years since quitting: 36.1   Smokeless tobacco: Never  Vaping Use   Vaping Use: Never used  Substance and Sexual Activity   Alcohol use: Yes    Comment: History of heavy use but only social at present.  10/29/15 - "a beer every week or so"   Drug use: No    Comment: Prior cocaine use but none since 2000   Sexual activity: Not Currently  Other Topics Concern   Not on file  Social History Narrative   He is widowed for 13 years, and has 2 daughters: [name] & [address] [place], and one son Raynald Paris. the other history is daughter who is here with him today.    He works for [employer] as a [employer].   He quit smoking in 1988. He does drink alcohol socially, but he does have a history of heavy use in the past. He also has a prior history of cocaine use but none since 2000.   Social Determinants of Health   Financial Resource Strain: Not on file  Food Insecurity: Not on file  Transportation Needs: Not on file  Physical Activity: Not on file  Stress: Not on file  Social Connections: Not on file  Intimate Partner Violence: Not on file    Review of Systems 13 point review of systems per patient health survey noted.  Negative other than as indicated above or in HPI.    Objective:   Vitals:   07/29/22 1546  BP: 122/78  Pulse: 96  Temp: 99.2 F (37.3 C)  TempSrc: Temporal  SpO2: 99%  Weight: 270 lb (122.5 kg)  Height: '5\' 5"'$  (1.651 m)     Physical Exam Vitals reviewed.  Constitutional:      Appearance: He is well-developed.  HENT:     Head: Normocephalic and atraumatic.     Right Ear: External  ear normal.     Left Ear: External ear normal.  Eyes:     Conjunctiva/sclera: Conjunctivae normal.     Pupils: Pupils are equal, round, and reactive to light.  Neck:     Thyroid: No thyromegaly.  Cardiovascular:     Rate and  Rhythm: Normal rate and regular rhythm.     Heart sounds: Normal heart sounds.  Pulmonary:     Effort: Pulmonary effort is normal. No respiratory distress.     Breath sounds: Normal breath sounds. No wheezing.  Abdominal:     General: There is no distension.     Palpations: Abdomen is soft.     Tenderness: There is no abdominal tenderness.  Musculoskeletal:        General: No tenderness. Normal range of motion.     Cervical back: Normal range of motion and neck supple.  Lymphadenopathy:     Cervical: No cervical adenopathy.  Skin:    General: Skin is warm and dry.  Neurological:     Mental Status: He is alert and oriented to person, place, and time.     Deep Tendon Reflexes: Reflexes are normal and symmetric.  Psychiatric:        Behavior: Behavior normal.      Assessment & Plan:  Nicholas Potts is a 62 y.o. male . Annual physical exam  - -anticipatory guidance as below in AVS, screening labs above. Health maintenance items as above in HPI discussed/recommended as applicable.   Need for shingles vaccine - Plan: Varicella-zoster vaccine IM  Prediabetes - Plan: Amb ref to Medical Nutrition Therapy-MNT  - diet discussed, handout given,refer to nutritionist.   Morbid obesity ([place]) - Plan: Amb ref to Medical Nutrition Therapy-MNT  - as above. Diet and low intensity exercise discussed.   Need for RSV immunization - Plan: RSV,Recombinant PF (Arexvy)  - nurse visit planned for arexvy, shingrix, with possible side effects discussed.   No orders of the defined types were placed in this encounter.  Patient Instructions  Repeat shingles vaccine in 2-6 months.  Colonoscopy next year.  Continue exercise most days per week with goal of 150 minutes per week.  Take care!  Prediabetes Eating Plan Prediabetes is a condition that causes blood sugar (glucose) levels to be higher than normal. This increases the risk for developing type 2 diabetes (type 2 diabetes mellitus). Working  with a health care provider or nutrition specialist (dietitian) to make diet and lifestyle changes can help prevent the onset of diabetes. These changes may help you: Control your blood glucose levels. Improve your cholesterol levels. Manage your blood pressure. What are tips for following this plan? Reading food labels Read food labels to check the amount of fat, salt (sodium), and sugar in prepackaged foods. Avoid foods that have: Saturated fats. Trans fats. Added sugars. Avoid foods that have more than 300 milligrams (mg) of sodium per serving. Limit your sodium intake to less than 2,300 mg each day. Shopping Avoid buying pre-made and processed foods. Avoid buying drinks with added sugar. Cooking Cook with olive oil. Do not use butter, lard, or ghee. Bake, broil, grill, steam, or boil foods. Avoid frying. Meal planning  Work with your dietitian to create an eating plan that is right for you. This may include tracking how many calories you take in each day. Use a food diary, notebook, or mobile application to track what you eat at each meal. Consider following a Mediterranean diet. This includes: Eating several servings of fresh fruits and  vegetables each day. Eating fish at least twice a week. Eating one serving each day of whole grains, beans, nuts, and seeds. Using olive oil instead of other fats. Limiting alcohol. Limiting red meat. Using nonfat or low-fat dairy products. Consider following a plant-based diet. This includes dietary choices that focus on eating mostly vegetables and fruit, grains, beans, nuts, and seeds. If you have high blood pressure, you may need to limit your sodium intake or follow a diet such as the DASH (Dietary Approaches to Stop Hypertension) eating plan. The DASH diet aims to lower high blood pressure. Lifestyle Set weight loss goals with help from your health care team. It is recommended that most people with prediabetes lose 7% of their body  weight. Exercise for at least 30 minutes 5 or more days a week. Attend a support group or seek support from a mental health counselor. Take over-the-counter and prescription medicines only as told by your health care provider. What foods are recommended? Fruits Berries. Bananas. Apples. Oranges. Grapes. Papaya. Mango. Pomegranate. Kiwi. Grapefruit. Cherries. Vegetables Lettuce. Spinach. Peas. Beets. Cauliflower. Cabbage. Broccoli. Carrots. Tomatoes. Squash. Eggplant. Herbs. Peppers. Onions. Cucumbers. Brussels sprouts. Grains Whole grains, such as whole-wheat or whole-grain breads, crackers, cereals, and pasta. Unsweetened oatmeal. Bulgur. Barley. Quinoa. Brown rice. Corn or whole-wheat flour tortillas or taco shells. Meats and other proteins Seafood. Poultry without skin. Lean cuts of pork and beef. Tofu. Eggs. Nuts. Beans. Dairy Low-fat or fat-free dairy products, such as yogurt, cottage cheese, and cheese. Beverages Water. Tea. Coffee. Sugar-free or diet soda. Seltzer water. Low-fat or nonfat milk. Milk alternatives, such as soy or almond milk. Fats and oils Olive oil. Canola oil. Sunflower oil. Grapeseed oil. Avocado. Walnuts. Sweets and desserts Sugar-free or low-fat pudding. Sugar-free or low-fat ice cream and other frozen treats. Seasonings and condiments Herbs. Sodium-free spices. Mustard. Relish. Low-salt, low-sugar ketchup. Low-salt, low-sugar barbecue sauce. Low-fat or fat-free mayonnaise. The items listed above may not be a complete list of recommended foods and beverages. Contact a dietitian for more information. What foods are not recommended? Fruits Fruits canned with syrup. Vegetables Canned vegetables. Frozen vegetables with butter or cream sauce. Grains Refined white flour and flour products, such as bread, pasta, snack foods, and cereals. Meats and other proteins Fatty cuts of meat. Poultry with skin. Breaded or fried meat. Processed meats. Dairy Full-fat yogurt,  cheese, or milk. Beverages Sweetened drinks, such as iced tea and soda. Fats and oils Butter. Lard. Ghee. Sweets and desserts Baked goods, such as cake, cupcakes, pastries, cookies, and cheesecake. Seasonings and condiments Spice mixes with added salt. Ketchup. Barbecue sauce. Mayonnaise. The items listed above may not be a complete list of foods and beverages that are not recommended. Contact a dietitian for more information. Where to find more information American Diabetes Association: www.diabetes.org Summary You may need to make diet and lifestyle changes to help prevent the onset of diabetes. These changes can help you control blood sugar, improve cholesterol levels, and manage blood pressure. Set weight loss goals with help from your health care team. It is recommended that most people with prediabetes lose 7% of their body weight. Consider following a Mediterranean diet. This includes eating plenty of fresh fruits and vegetables, whole grains, beans, nuts, seeds, fish, and low-fat dairy, and using olive oil instead of other fats. This information is not intended to replace advice given to you by your health care provider. Make sure you discuss any questions you have with your health care provider. Document Revised: 08/24/2019  Document Reviewed: 08/24/2019 Elsevier Patient Education  2023 Elsevier Inc.    Preventive Care 78-32 Years Old, Male Preventive care refers to lifestyle choices and visits with your health care provider that can promote health and wellness. Preventive care visits are also called wellness exams. What can I expect for my preventive care visit? Counseling During your preventive care visit, your health care provider may ask about your: Medical history, including: Past medical problems. Family medical history. Current health, including: Emotional well-being. Home life and relationship well-being. Sexual activity. Lifestyle, including: Alcohol, nicotine or  tobacco, and drug use. Access to firearms. Diet, exercise, and sleep habits. Safety issues such as seatbelt and bike helmet use. Sunscreen use. Work and work Statistician. Physical exam Your health care provider will check your: Height and weight. These may be used to calculate your BMI (body mass index). BMI is a measurement that tells if you are at a healthy weight. Waist circumference. This measures the distance around your waistline. This measurement also tells if you are at a healthy weight and may help predict your risk of certain diseases, such as type 2 diabetes and high blood pressure. Heart rate and blood pressure. Body temperature. Skin for abnormal spots. What immunizations do I need?  Vaccines are usually given at various ages, according to a schedule. Your health care provider will recommend vaccines for you based on your age, medical history, and lifestyle or other factors, such as travel or where you work. What tests do I need? Screening Your health care provider may recommend screening tests for certain conditions. This may include: Lipid and cholesterol levels. Diabetes screening. This is done by checking your blood sugar (glucose) after you have not eaten for a while (fasting). Hepatitis B test. Hepatitis C test. HIV (human immunodeficiency virus) test. STI (sexually transmitted infection) testing, if you are at risk. Lung cancer screening. Prostate cancer screening. Colorectal cancer screening. Talk with your health care provider about your test results, treatment options, and if necessary, the need for more tests. Follow these instructions at home: Eating and drinking  Eat a diet that includes fresh fruits and vegetables, whole grains, lean protein, and low-fat dairy products. Take vitamin and mineral supplements as recommended by your health care provider. Do not drink alcohol if your health care provider tells you not to drink. If you drink alcohol: Limit how  much you have to 0-2 drinks a day. Know how much alcohol is in your drink. In the [place] one drink equals one 12 oz bottle of beer (355 mL), one 5 oz glass of wine (148 mL), or one 1 oz glass of hard liquor (44 mL). Lifestyle Brush your teeth every morning and night with fluoride toothpaste. Floss one time each day. Exercise for at least 30 minutes 5 or more days each week. Do not use any products that contain nicotine or tobacco. These products include cigarettes, chewing tobacco, and vaping devices, such as e-cigarettes. If you need help quitting, ask your health care provider. Do not use drugs. If you are sexually active, practice safe sex. Use a condom or other form of protection to prevent STIs. Take aspirin only as told by your health care provider. Make sure that you understand how much to take and what form to take. Work with your health care provider to find out whether it is safe and beneficial for you to take aspirin daily. Find healthy ways to manage stress, such as: Meditation, yoga, or listening to music. Journaling. Talking to a trusted  person. Spending time with friends and family. Minimize exposure to UV radiation to reduce your risk of skin cancer. Safety Always wear your seat belt while driving or riding in a vehicle. Do not drive: If you have been drinking alcohol. Do not ride with someone who has been drinking. When you are tired or distracted. While texting. If you have been using any mind-altering substances or drugs. Wear a helmet and other protective equipment during sports activities. If you have firearms in your house, make sure you follow all gun safety procedures. What's next? Go to your health care provider once a year for an annual wellness visit. Ask your health care provider how often you should have your eyes and teeth checked. Stay up to date on all vaccines. This information is not intended to replace advice given to you by your health care provider.  Make sure you discuss any questions you have with your health care provider. Document Revised: 11/20/2020 Document Reviewed: 11/20/2020 Elsevier Patient Education  [place],   Merri Ray, MD [facility] 07/29/22 4:19 PM

## 2022-07-31 ENCOUNTER — Ambulatory Visit (INDEPENDENT_AMBULATORY_CARE_PROVIDER_SITE_OTHER): Payer: BC Managed Care – PPO

## 2022-07-31 DIAGNOSIS — Z23 Encounter for immunization: Secondary | ICD-10-CM

## 2022-07-31 DIAGNOSIS — Z2911 Encounter for prophylactic immunotherapy for respiratory syncytial virus (RSV): Secondary | ICD-10-CM

## 2022-07-31 NOTE — Progress Notes (Signed)
Patient presents today for 2 shots one for RSV and the other is the first dose of the shingles vaccine, pt voiced understanding and did well for both injections

## 2022-08-10 ENCOUNTER — Encounter: Payer: Self-pay | Admitting: Internal Medicine

## 2022-08-13 MED ORDER — MODAFINIL 200 MG PO TABS: ORAL_TABLET | ORAL | 5 refills | Status: DC

## 2022-08-13 NOTE — Telephone Encounter (Signed)
Dr. Annamaria Boots, please advise on refills. Preferred pharmacy is [facility]. He would like to increased Provigil to '200mg'$ .  Current Outpatient Medications on File Prior to Visit  Medication Sig Dispense Refill   aspirin EC 81 MG tablet Take 1 tablet (81 mg total) by mouth daily. 90 tablet 3   atorvastatin (LIPITOR) 10 MG tablet TAKE 1 TABLET(10 MG) BY MOUTH DAILY 90 tablet 0   azelastine (OPTIVAR) 0.05 % ophthalmic solution Place 1 drop into both eyes 2 (two) times daily. 6 mL 1   mirabegron ER (MYRBETRIQ) 25 MG TB24 tablet Take 1 tablet (25 mg total) by mouth daily. 28 tablet 0   modafinil (PROVIGIL) 100 MG tablet 1 each morning as needed for alertness. 31 tablet 5   Multiple Vitamin (MULTIVITAMIN WITH MINERALS) TABS tablet Take 1 tablet by mouth daily.     No current facility-administered medications on file prior to visit.

## 2022-08-21 ENCOUNTER — Ambulatory Visit: Payer: BC Managed Care – PPO | Admitting: Urology

## 2022-08-21 ENCOUNTER — Encounter: Payer: Self-pay | Admitting: Urology

## 2022-08-21 VITALS — BP 115/71 | HR 84 | Ht 67.0 in | Wt 270.0 lb

## 2022-08-21 DIAGNOSIS — R3915 Urgency of urination: Secondary | ICD-10-CM | POA: Diagnosis not present

## 2022-08-21 DIAGNOSIS — R350 Frequency of micturition: Secondary | ICD-10-CM | POA: Diagnosis not present

## 2022-08-21 LAB — URINALYSIS, ROUTINE W REFLEX MICROSCOPIC
Bilirubin, UA: NEGATIVE
Ketones, UA: NEGATIVE
Leukocytes,UA: NEGATIVE
Nitrite, UA: NEGATIVE
RBC, UA: NEGATIVE
Specific Gravity, UA: 1.025 (ref 1.005–1.030)
Urobilinogen, Ur: 0.2 mg/dL (ref 0.2–1.0)
pH, UA: 5.5 (ref 5.0–7.5)

## 2022-08-21 LAB — BLADDER SCAN AMB NON-IMAGING

## 2022-08-21 MED ORDER — MIRABEGRON ER 25 MG PO TB24: 25.0000 mg | ORAL_TABLET | Freq: Every day | ORAL | 11 refills | Status: DC

## 2022-08-21 NOTE — Progress Notes (Signed)
Assessment: 1. Urinary urgency   2. Urinary frequency     Plan: Continue Myrbetriq 25 mg daily.  Samples and rx given.  Bladder diet sheet given. Return to office in 2 months  Chief Complaint:  Chief Complaint  Patient presents with   Urinary Frequency    History of Present Illness:  Nicholas Potts is a 62 y.o. male who is seen for further evaluation of lower urinary symptoms.  His symptoms have been present for at least 1 year.  He reported urinary frequency, voiding 8-10 times per day, urgency and occasional urge incontinence.  He noted some slight dysuria at the end of his void.  No nocturia.  He reported that he voids with a good stream and feels like he empties his bladder completely.  No gross hematuria.  No history of UTIs.  He has a remote history of gonorrhea 30 years ago.  He had not tried any medical therapy for his symptoms. IPSS = 5.  PVR = 0 ml.  PSA 0.74 from 1/24.  He was given a trial of Myrbetriq 25 mg daily at his visit in 2/24. He returns today for scheduled follow-up.  He reports improvement in his urinary symptoms with Myrbetriq.  He had decreased frequency and urgency and resolution of his incontinence.  No side effects from the medication.  He reports that he was voiding every 3-4 hours while on the medication.  He recently ran out of samples.  He continues to have some slight dysuria during voiding.  No gross hematuria or flank pain. IPSS = 7 today.  The patient's daughter interpreted during visit.  Portions of the above documentation were copied from a prior visit for review purposes only.   Past Medical History:  Past Medical History:  Diagnosis Date   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[place]) 2014   post op    ARF (acute respiratory failure) ([place]) 2014   Blood dyscrasia    Chronic kidney disease 123456   ARF    Complication of anesthesia 2014   - post op - in combination of pain pills-  Cardiac and Respiratory Arrest- prior to sleep apnea diagonosis    Constipation    CVA (cerebral infarction)    Hyperlipidemia    Hypertension    OSA (obstructive sleep apnea)    Pre-diabetes    Short-term memory loss    Sleep apnea    Stress-induced cardiomyopathy -- RESOLVED    Post-op Narcotic induced Acute Hypoxic respiratory failure with respiratory and PEA cardiac arrest following accidental narcotic overdose; initial [place] 20-20%, now improved to 55%   Was supported with Impella Cardiac Cath - non-obstructive CAD ; 1 month after event - EF up from 25% to 55-60% - stable x 5 yrs   Thrombocytopenia ([place]) 2014    Past Surgical History:  Past Surgical History:  Procedure Laterality Date   ACHILLES TENDON REPAIR     BIOPSY  08/01/2019   Procedure: BIOPSY;  Surgeon: Mauri Pole, MD;  Location: [facility];  Service: Endoscopy;;   BIOPSY  08/05/2020   Procedure: BIOPSY;  Surgeon: Mauri Pole, MD;  Location: [facility];  Service: Endoscopy;;   CARDIAC CATHETERIZATION  12/29/2012   RN LHC --> Impella. for severe shock with low EF. Mild to moderate RV pressure elevation. Normal coronary arteries. Cardiac Output 2.5, Index 1.46.   COLONOSCOPY WITH PROPOFOL N/A 10/30/2015   Procedure: COLONOSCOPY WITH PROPOFOL;  Surgeon: Mauri Pole, MD;  Location: [facility];  Service: Endoscopy;  Laterality:  N/A;   COLONOSCOPY WITH PROPOFOL N/A 08/01/2019   Procedure: COLONOSCOPY WITH PROPOFOL;  Surgeon: Mauri Pole, MD;  [facility];  Service: Endoscopy;  Laterality: N/A;   COLONOSCOPY WITH PROPOFOL N/A 08/05/2020   Procedure: COLONOSCOPY WITH PROPOFOL;  Surgeon: Mauri Pole, MD;  [facility];  Service: Endoscopy;  Laterality: N/A;   INSERTION OF DIALYSIS CATHETER Right 01/09/2013   Procedure: INSERTION OF DIALYSIS CATHETER;  Surgeon: Angelia Mould, MD;  Location: [facility];  Service: Vascular;  Laterality: Right;   LEFT AND RIGHT HEART CATHETERIZATION WITH CORONARY ANGIOGRAM  12/29/2012   Procedure:  LEFT AND RIGHT HEART CATHETERIZATION WITH CORONARY ANGIOGRAM;  Surgeon: Sherren Mocha, MD;  Location: [facility] CATH LAB;  Service: Cardiovascular;;   POLYPECTOMY  08/01/2019   Procedure: POLYPECTOMY;  Surgeon: Mauri Pole, MD;  [facility];  Service: Endoscopy;;   POLYPECTOMY  08/05/2020   Procedure: POLYPECTOMY;  Surgeon: Mauri Pole, MD;  [facility];  Service: Endoscopy;;   TRANSTHORACIC ECHOCARDIOGRAM  12/29/2012   EF 25% with periapical HK/AKA.  --> Followup echo 7/25 -- EF of 35%   TRANSTHORACIC ECHOCARDIOGRAM  01/2013 ; 07/2015   a) EF 55-60%. No regional WMA. Grade 1 diastolic dysfunction. Mild LA dilatation.; b) Normal LV size with mild LV hypertrophy. EF 55-60%. Normal RV   TRANSTHORACIC ECHOCARDIOGRAM  03/2018   EF 55-60%.  NO RWMA. Normal valves    Allergies:  Allergies  Allergen Reactions   Oxycodone     Oxygen levels drop    Family History:  Family History  Problem Relation Age of Onset   Cancer Mother        Liver cancer   Cancer Father        Leukemia   Cancer Maternal Grandmother        Liver cancer   Cancer Paternal Grandmother        Colon cancer   Colon cancer Neg Hx     Social History:  Social History   Tobacco Use   Smoking status: Former    Types: Cigarettes    Quit date: 06/20/1986    Years since quitting: 36.1   Smokeless tobacco: Never  Vaping Use   Vaping Use: Never used  Substance Use Topics   Alcohol use: Yes    Comment: History of heavy use but only social at present.  10/29/15 - "a beer every week or so"   Drug use: No    Comment: Prior cocaine use but none since 2000    ROS: Constitutional:  Negative for fever, chills, weight loss CV: Negative for chest pain, previous MI, hypertension Respiratory:  Negative for shortness of breath, wheezing, sleep apnea, frequent cough GI:  Negative for nausea, vomiting, bloody stool, GERD  Physical exam: BP 115/71   Pulse 84   Ht 5\' 7"  (1.702 m)   Wt 270 lb  (122.5 kg)   BMI 42.29 kg/m  GENERAL APPEARANCE:  Well appearing, well developed, well nourished, NAD HEENT:  Atraumatic, normocephalic, oropharynx clear NECK:  Supple without lymphadenopathy or thyromegaly ABDOMEN:  Soft, non-tender, no masses EXTREMITIES:  Moves all extremities well, without clubbing, cyanosis, or edema NEUROLOGIC:  Alert and oriented x 3, normal gait, CN II-XII grossly intact MENTAL STATUS:  appropriate BACK:  Non-tender to palpation, No CVAT SKIN:  Warm, dry, and intact  Results: U/A: 2+ glucose, 1+ protein  PVR:  0 ml

## 2022-08-28 ENCOUNTER — Ambulatory Visit: Payer: BC Managed Care – PPO | Admitting: Family Medicine

## 2022-08-28 ENCOUNTER — Encounter: Payer: Self-pay | Admitting: Family Medicine

## 2022-08-28 VITALS — BP 132/70 | HR 75 | Temp 97.8°F | Ht 67.0 in | Wt 270.0 lb

## 2022-08-28 DIAGNOSIS — L03119 Cellulitis of unspecified part of limb: Secondary | ICD-10-CM | POA: Diagnosis not present

## 2022-08-28 DIAGNOSIS — S91302A Unspecified open wound, left foot, initial encounter: Secondary | ICD-10-CM

## 2022-08-28 MED ORDER — DOXYCYCLINE HYCLATE 100 MG PO TABS: 100.0000 mg | ORAL_TABLET | Freq: Two times a day (BID) | ORAL | 0 refills | Status: DC

## 2022-08-28 NOTE — Progress Notes (Signed)
Subjective:  Patient ID: Nicholas Potts, male    DOB: [birth year]  Age: 62 y.o. MRN: [id number]  CC:  Chief Complaint  Patient presents with   Wound Check    Pt presents today for a scratch on the foot from 3 weeks ago it is blue and purple on the top of pt Lt foot,    Here with spanish interpreter.  HPI Nicholas Potts presents for   L foot wound: Initial injury 3 weeks ago, initially itching, peeled skin of with other toenail. Not wanting to heal. Now notices redness around area, more sore.  No fever/chils. No bone pain or inside foot pain.  Tx: peroxide, neosporin, alcohol, other topical dye.   History Patient Active Problem List   Diagnosis Date Noted   Urinary urgency 07/10/2022   Urinary frequency 07/10/2022   Morbid obesity with BMI of 40.0-44.9, adult ([place]) 09/10/2021   Fatigue 09/10/2021   History of colonic polyps    Polyp of ascending colon    Polyp of transverse colon    Polyp of cecum    Polyp of descending colon    Polyp of sigmoid colon    Hypersomnia 11/29/2017   Other headache syndrome 05/05/2017   Vertigo 05/05/2017   IFG (impaired fasting glucose) 08/21/2016   Obesity (BMI 30-39.9) 06/23/2013   Stress-induced cardiomyopathy -- essentially resolved    CVA (cerebral infarction)    OSA (obstructive sleep apnea)    Hyperlipidemia    Physical deconditioning 01/24/2013   Tinea pedis 01/18/2013   Essential hypertension 01/13/2013   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[place]) 01/11/2013   ARF (acute renal failure) with tubular necrosis requiring HD 01/11/2013   Anemia 01/11/2013   Thrombocytopenia, unspecified ([place]) 01/11/2013   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 12/29/2012   Past Medical History:  Diagnosis Date   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[place]) 2014   post op    ARF (acute respiratory failure) ([place]) 2014   Blood dyscrasia    Chronic kidney disease 123456   ARF    Complication of anesthesia 2014    - post op - in combination of pain pills-  Cardiac and Respiratory Arrest- prior to sleep apnea diagonosis   Constipation    CVA (cerebral infarction)    Hyperlipidemia    Hypertension    OSA (obstructive sleep apnea)    Pre-diabetes    Short-term memory loss    Sleep apnea    Stress-induced cardiomyopathy -- RESOLVED    Post-op Narcotic induced Acute Hypoxic respiratory failure with respiratory and PEA cardiac arrest following accidental narcotic overdose; initial [place] 20-20%, now improved to 55%   Was supported with Impella Cardiac Cath - non-obstructive CAD ; 1 month after event - EF up from 25% to 55-60% - stable x 5 yrs   Thrombocytopenia ([place]) 2014   Past Surgical History:  Procedure Laterality Date   ACHILLES TENDON REPAIR     BIOPSY  08/01/2019   Procedure: BIOPSY;  Surgeon: Mauri Pole, MD;  [facility];  Service: Endoscopy;;   BIOPSY  08/05/2020   Procedure: BIOPSY;  Surgeon: Mauri Pole, MD;  [facility];  Service: Endoscopy;;   CARDIAC CATHETERIZATION  12/29/2012   RN LHC --> Impella. for severe shock with low EF. Mild to moderate RV pressure elevation. Normal coronary arteries. Cardiac Output 2.5, Index 1.46.   COLONOSCOPY WITH PROPOFOL N/A 10/30/2015   Procedure: COLONOSCOPY WITH PROPOFOL;  Surgeon: Mauri Pole, MD;  Location:  [place];  Service: Endoscopy;  Laterality: N/A;   COLONOSCOPY WITH PROPOFOL N/A 08/01/2019   Procedure: COLONOSCOPY WITH PROPOFOL;  Surgeon: Mauri Pole, MD;  [facility];  Service: Endoscopy;  Laterality: N/A;   COLONOSCOPY WITH PROPOFOL N/A 08/05/2020   Procedure: COLONOSCOPY WITH PROPOFOL;  Surgeon: Mauri Pole, MD;  [facility];  Service: Endoscopy;  Laterality: N/A;   INSERTION OF DIALYSIS CATHETER Right 01/09/2013   Procedure: INSERTION OF DIALYSIS CATHETER;  Surgeon: Angelia Mould, MD;  Location: [place];  Service: Vascular;  Laterality: Right;   LEFT  AND RIGHT HEART CATHETERIZATION WITH CORONARY ANGIOGRAM  12/29/2012   Procedure: LEFT AND RIGHT HEART CATHETERIZATION WITH CORONARY ANGIOGRAM;  Surgeon: Sherren Mocha, MD;  Location: [facility] CATH LAB;  Service: Cardiovascular;;   POLYPECTOMY  08/01/2019   Procedure: POLYPECTOMY;  Surgeon: Mauri Pole, MD;  [facility];  Service: Endoscopy;;   POLYPECTOMY  08/05/2020   Procedure: POLYPECTOMY;  Surgeon: Mauri Pole, MD;  [facility];  Service: Endoscopy;;   TRANSTHORACIC ECHOCARDIOGRAM  12/29/2012   EF 25% with periapical HK/AKA.  --> Followup echo 7/25 -- EF of 35%   TRANSTHORACIC ECHOCARDIOGRAM  01/2013 ; 07/2015   a) EF 55-60%. No regional WMA. Grade 1 diastolic dysfunction. Mild LA dilatation.; b) Normal LV size with mild LV hypertrophy. EF 55-60%. Normal RV   TRANSTHORACIC ECHOCARDIOGRAM  03/2018   EF 55-60%.  NO RWMA. Normal valves   Allergies  Allergen Reactions   Oxycodone     Oxygen levels drop   Prior to Admission medications   Medication Sig Start Date End Date Taking? Authorizing Provider  aspirin EC 81 MG tablet Take 1 tablet (81 mg total) by mouth daily. 05/13/17  Yes Meng, Isaac Laud, PA  atorvastatin (LIPITOR) 10 MG tablet TAKE 1 TABLET(10 MG) BY MOUTH DAILY 06/24/22  Yes [name], MD  azelastine (OPTIVAR) 0.05 % ophthalmic solution Place 1 drop into both eyes 2 (two) times daily. 05/12/21  Yes [name], MD  mirabegron ER (MYRBETRIQ) 25 MG TB24 tablet Take 1 tablet (25 mg total) by mouth daily. 08/21/22  Yes Stoneking, Reece Leader., MD  modafinil (PROVIGIL) 200 MG tablet 1 each morning as needed for alertness. 08/13/22  Yes Young, Tarri Fuller D, MD  Multiple Vitamin (MULTIVITAMIN WITH MINERALS) TABS tablet Take 1 tablet by mouth daily.   Yes [provider]   Social History   Socioeconomic History   Marital status: Widowed    Spouse name: Not on file   Number of children: Not on file   Years of education: Not on file   Highest  education level: Not on file  Occupational History   Not on file  Tobacco Use   Smoking status: Former    Types: Cigarettes    Quit date: 06/20/1986    Years since quitting: 36.2   Smokeless tobacco: Never  Vaping Use   Vaping Use: Never used  Substance and Sexual Activity   Alcohol use: Yes    Comment: History of heavy use but only social at present.  10/29/15 - "a beer every week or so"   Drug use: No    Comment: Prior cocaine use but none since 2000   Sexual activity: Not Currently  Other Topics Concern   Not on file  Social History Narrative   He is widowed for 13 years, and has 2 daughters: [name] & [address] [place], and one son Sriharsha Mcmaken.  the other history is daughter who is here with him today.    He works for [employer] and [employer] as a [employer].   He quit smoking in 1988. He does drink alcohol socially, but he does have a history of heavy use in the past. He also has a prior history of cocaine use but none since 2000.   Social Determinants of Health   Financial Resource Strain: Not on file  Food Insecurity: Not on file  Transportation Needs: Not on file  Physical Activity: Not on file  Stress: Not on file  Social Connections: Not on file  Intimate Partner Violence: Not on file    Review of Systems Per Hpi  Objective:   Vitals:   08/28/22 0804  BP: 132/70  Pulse: 75  Temp: 97.8 F (36.6 C)  TempSrc: Temporal  SpO2: 95%  Weight: 270 lb (122.5 kg)  Height: 5\' 7"  (1.702 m)    Physical Exam Constitutional:      General: He is not in acute distress.    Appearance: Normal appearance. He is well-developed.  HENT:     Head: Normocephalic and atraumatic.  Cardiovascular:     Rate and Rhythm: Normal rate.  Pulmonary:     Effort: Pulmonary effort is normal.  Skin:      Neurological:     Mental Status: He is alert and oriented to person, place, and time.  Psychiatric:        Mood and Affect: Mood normal.        Assessment & Plan:  Nicholas Potts is a 62 y.o. male . Wound of left foot - Plan: doxycycline (VIBRA-TABS) 100 MG tablet  Cellulitis of foot - Plan: doxycycline (VIBRA-TABS) 100 MG tablet Initial abrasion with persistent dorsal foot wound, now with possible slight component of cellulitis.  Avoid topicals at this time other than soap and water as that may be causing some secondary irritation.  Clean twice per day with overlying bandage afterwards, start doxycycline, recheck 10 days.  RTC precautions.  Potential side effects and risks of antibiotics discussed.  Understanding expressed with use of interpreter and speaking Spanish.  Meds ordered this encounter  Medications   doxycycline (VIBRA-TABS) 100 MG tablet    Sig: Take 1 tablet (100 mg total) by mouth 2 (two) times daily.    Dispense:  14 tablet    Refill:  0   Patient Instructions  Limpiar 2 veces cadad dia, y tome antibiotico. Regrese en 10 dias, mas temprano si empeora.   Celulitis, en adultos Cellulitis, Adult  La celulitis es una infeccin de la piel. La zona infectada generalmente est caliente, de color rojo, hinchada y duele. Generalmente ocurre en la parte inferior del cuerpo, como las piernas, los pies y los dedos de los pies, pero esta afeccin puede ocurrir en cualquier parte del cuerpo. La infeccin puede propagarse a los msculos, la sangre y el tejido [place], y tornarse potencialmente mortal sin tratamiento. Es importante obtener tratamiento de inmediato para Personnel officer. Cules son las causas? La celulitis es causada por bacterias. Las bacterias ingresan a travs de una lesin cutnea, por ejemplo, un corte, una [place], [place] de insecto o [place] de Higher education careers adviser, [place] llaga abierta o una grieta. Qu incrementa el riesgo? Es ms probable que Orthoptist en personas que: Tienen debilitado el sistema de defensa del cuerpo (sistema inmunitario). Tienen ms de 60 aos. Tienen  diabetes. Tienen un tipo de enfermedad renal o de enfermedad heptica (cirrosis) de larga duracin (  crnica). Tienen obesidad. Tienen una afeccin en la piel, por ejemplo: Una erupcin cutnea con picazn, como eczema o psoriasis. Una erupcin por hongos en los pies o en los pliegues de la piel. Erupciones cutneas con ampollas, como culebrilla o varicela. Movimiento lento de Herbalist en las venas (estasis venosa). Acumulacin de lquido debajo de la piel (edema). Tienen heridas abiertas en la piel, como cortes, heridas por puncin, quemaduras, picaduras, rasguos, tatuajes, piercings o heridas de [place]. Han recibido radioterapia. Consumen drogas por va intravenosa. Cules son los signos o sntomas? Los sntomas de esta afeccin incluyen: Piel con un aspecto de color rojo o morado, o ligeramente ms oscuro que el color habitual de la piel. Lneas o manchas en la piel. Zona de la piel hinchada. Dolor o sensibilidad al tacto en una zona de la piel. Calor en la piel. Fiebre o escalofros. Ampollas. Cansancio (fatiga). Cmo se diagnostica? Esta afeccin se diagnostica en funcin de los antecedentes mdicos y un examen fsico. Tambin pueden hacerle pruebas, que incluyen las siguientes: Anlisis de [place]. Pruebas de diagnstico por imgenes. Pruebas en Truddie Coco de lquido extrado de la herida (cultivo de la herida). Cmo se trata? El tratamiento de esta afeccin puede incluir lo siguiente: Medicamentos. Estos pueden incluir antibiticos o medicamentos para tratar las alergias (antihistamnicos). Descanso. Aplicacin de paos fros o tibios (compresas) sobre la piel. Si la afeccin es ms grave, es posible que [facility] hospital y recibir antibiticos por va intravenosa. Por lo general, la infeccin comienza a Teacher, English as a foreign language en 1 o 2 das de [place]. Siga estas instrucciones en su casa: Medicamentos Use los medicamentos de venta libre y los recetados solamente  como se lo haya indicado el mdico. Si le recetaron antibiticos, tmelos como se lo haya indicado el mdico. No deje de usar el antibitico aunque comience a Sports administrator. Instrucciones generales Beba suficiente lquido para mantener el pis (orina) de color amarillo plido. No toque ni frote la zona infectada. Cuando est sentado o acostado, levante (eleve) la zona infectada por encima del nivel del corazn. Retome sus actividades normales como se lo haya indicado el mdico. Pregntele al mdico qu actividades son seguras para usted. Aplique compresas fras o tibias en la zona afectada como se lo haya indicado el mdico. Concurra a todas las visitas de seguimiento. El mdico necesitar asegurarse de que no se est desarrollando una infeccin ms grave. Comunquese con un mdico si: Tiene fiebre. Los sntomas no mejoran en el trmino de 1 o 2 das del comienzo del tratamiento o usted experimenta nuevos sntomas. El hueso o la articulacin que se encuentran por debajo de la zona infectada le duelen despus de que la piel se [place]. La infeccin se repite en la misma zona o en una zona diferente. Algunos signos de esto podran ser los siguientes, entre otros: Tiene una protuberancia inflamada en la zona infectada. La zona de color rojo se extiende, se torna de color oscuro o duele ms. La secrecin aumenta. Hay pus o mal olor en la zona infectada. Siente ms dolor. Se siente enfermo y tiene dolores musculares y debilidad. Tiene vmitos o diarrea que no desaparecen. Solicite ayuda de inmediato si: Observa una lnea roja en la piel que sale desde la zona infectada. Nota que la piel se torna de color morado o negro y se cae. Este sntoma puede Sales executive. Solicite ayuda de inmediato. Llame al 911. No espere a ver si el sntoma desaparece. No conduzca por sus propios medios hasta  el hospital. Esta informacin no tiene Marine scientist el consejo del mdico. Asegrese de hacerle  al mdico cualquier pregunta que tenga. Document Revised: 02/24/2022 Document Reviewed: 02/24/2022 Elsevier Patient Education  [place],   Merri Ray, MD [place] 08/28/22 8:30 AM

## 2022-08-28 NOTE — Patient Instructions (Signed)
Limpiar 2 veces cadad dia, y tome antibiotico. Regrese en 10 dias, mas temprano si empeora.   Celulitis, en adultos Cellulitis, Adult  La celulitis es una infeccin de la piel. La zona infectada generalmente est caliente, de color rojo, hinchada y duele. Generalmente ocurre en la parte inferior del cuerpo, como las piernas, los pies y los dedos de los pies, pero esta afeccin puede ocurrir en cualquier parte del cuerpo. La infeccin puede propagarse a los msculos, la sangre y el tejido [place], y tornarse potencialmente mortal sin tratamiento. Es importante obtener tratamiento de inmediato para Personnel officer. Cules son las causas? La celulitis es causada por bacterias. Las bacterias ingresan a travs de una lesin cutnea, por ejemplo, un corte, una [place], [place] de insecto o [place] de Higher education careers adviser, [place] llaga abierta o una grieta. Qu incrementa el riesgo? Es ms probable que Orthoptist en personas que: Tienen debilitado el sistema de defensa del cuerpo (sistema inmunitario). Tienen ms de 60 aos. Tienen diabetes. Tienen un tipo de enfermedad renal o de enfermedad heptica (cirrosis) de larga duracin (crnica). Tienen obesidad. Tienen una afeccin en la piel, por ejemplo: Una erupcin cutnea con picazn, como eczema o psoriasis. Una erupcin por hongos en los pies o en los pliegues de la piel. Erupciones cutneas con ampollas, como culebrilla o varicela. Movimiento lento de Herbalist en las venas (estasis venosa). Acumulacin de lquido debajo de la piel (edema). Tienen heridas abiertas en la piel, como cortes, heridas por puncin, quemaduras, picaduras, rasguos, tatuajes, piercings o heridas de [place]. Han recibido radioterapia. Consumen drogas por va intravenosa. Cules son los signos o sntomas? Los sntomas de esta afeccin incluyen: Piel con un aspecto de color rojo o morado, o ligeramente ms oscuro que el color habitual de la piel. Lneas o  manchas en la piel. Zona de la piel hinchada. Dolor o sensibilidad al tacto en una zona de la piel. Calor en la piel. Fiebre o escalofros. Ampollas. Cansancio (fatiga). Cmo se diagnostica? Esta afeccin se diagnostica en funcin de los antecedentes mdicos y un examen fsico. Tambin pueden hacerle pruebas, que incluyen las siguientes: Anlisis de [place]. Pruebas de diagnstico por imgenes. Pruebas en Truddie Coco de lquido extrado de la herida (cultivo de la herida). Cmo se trata? El tratamiento de esta afeccin puede incluir lo siguiente: Medicamentos. Estos pueden incluir antibiticos o medicamentos para tratar las alergias (antihistamnicos). Descanso. Aplicacin de paos fros o tibios (compresas) sobre la piel. Si la afeccin es ms grave, es posible que [facility] y recibir antibiticos por va intravenosa. Por lo general, la infeccin comienza a Teacher, English as a foreign language en 1 o 2 das de [place]. Siga estas instrucciones en su casa: Medicamentos Use los medicamentos de venta libre y los recetados solamente como se lo haya indicado el mdico. Si le recetaron antibiticos, tmelos como se lo haya indicado el mdico. No deje de usar el antibitico aunque comience a Sports administrator. Instrucciones generales Beba suficiente lquido para mantener el pis (orina) de color amarillo plido. No toque ni frote la zona infectada. Cuando est sentado o acostado, levante (eleve) la zona infectada por encima del nivel del corazn. Retome sus actividades normales como se lo haya indicado el mdico. Pregntele al mdico qu actividades son seguras para usted. Aplique compresas fras o tibias en la zona afectada como se lo haya indicado el mdico. Concurra a todas las visitas de seguimiento. El mdico necesitar asegurarse de que no se est desarrollando una infeccin ms grave. Comunquese con  un mdico si: Tiene fiebre. Los sntomas no mejoran en el trmino de 1 o 2 das del  comienzo del tratamiento o usted experimenta nuevos sntomas. El hueso o la articulacin que se encuentran por debajo de la zona infectada le duelen despus de que la piel se [place]. La infeccin se repite en la misma zona o en una zona diferente. Algunos signos de esto podran ser los siguientes, entre otros: Tiene una protuberancia inflamada en la zona infectada. La zona de color rojo se extiende, se torna de color oscuro o duele ms. La secrecin aumenta. Hay pus o mal olor en la zona infectada. Siente ms dolor. Se siente enfermo y tiene dolores musculares y debilidad. Tiene vmitos o diarrea que no desaparecen. Solicite ayuda de inmediato si: Observa una lnea roja en la piel que sale desde la zona infectada. Nota que la piel se torna de color morado o negro y se cae. Este sntoma puede Sales executive. Solicite ayuda de inmediato. Llame al 911. No espere a ver si el sntoma desaparece. No conduzca por sus propios medios [employer]. Esta informacin no tiene Marine scientist el consejo del mdico. Asegrese de hacerle al mdico cualquier pregunta que tenga. Document Revised: 02/24/2022 Document Reviewed: 02/24/2022 Elsevier Patient Education  [place].

## 2022-09-10 ENCOUNTER — Ambulatory Visit: Payer: BC Managed Care – PPO | Admitting: Internal Medicine

## 2022-09-11 ENCOUNTER — Ambulatory Visit: Payer: BC Managed Care – PPO | Admitting: Family Medicine

## 2022-09-15 ENCOUNTER — Ambulatory Visit: Payer: BC Managed Care – PPO | Admitting: Internal Medicine

## 2022-09-25 ENCOUNTER — Other Ambulatory Visit: Payer: Self-pay | Admitting: Family Medicine

## 2022-09-25 DIAGNOSIS — E785 Hyperlipidemia, unspecified: Secondary | ICD-10-CM

## 2022-09-28 ENCOUNTER — Ambulatory Visit: Payer: BC Managed Care – PPO | Admitting: Cardiology

## 2022-10-13 NOTE — Progress Notes (Deleted)
HPI M former smoker, followed for OSA,EDS,  HBP, history of acute respiratory failure after accidental narcotic overdose, Obesity, MI/CHF.Hyperlipidemia, Fatigue, Non-ischemic Cardiomyopathy,  NPSG 02/20/13  AHI 79.7/ hr, body weight 240 lbs NPSG ([facility]) 09/04/19- AHI 67.2/ hr, desaturation to 78%, body weight 263 lbs BIPAP titration sleep study 12/14/21 required BIPAP 18/11 with residual minimum O2 sat 87% and mean 91.1% =========================================================   03/24/22-  60 yoM former smoker, followed for OSA,EDS,  HBP, history of acute respiratory failure after accidental narcotic overdose, Obesity, MI/CHF.Hyperlipidemia, Fatigue, Non-ischemic Cardiomyopathy,  -modafinil 100 BIPAP titration sleep study 12/14/21 required BIPAP 18/11 with residual minimum O2 sat 87% and mean 91.1% BIPAP 18/11, PS3/ [facility]- ordered 12/23/21 Body weight today      Translator here today    Daughter (bilingual) also here Covid vax-3 Phizer Flu vax-had Daughter thinks Patsy Lager has been reaching out to her brother's phone number trying to set up BiPAP, but brother is in [place].  We called [facility] this afternoon and were told patient needs to call them for appointment to set up BiPAP new machine.  Daughter is going to call them in the morning. He wants to try increasing modafinil dose to 200 mg daily.  I have suggested he use up his current supply by taking 2 of the 100 mg tablets he currently has.  10/15/22-  61 yoM former smoker, followed for OSA,EDS,  HBP, history of acute respiratory failure after accidental narcotic overdose, Obesity, MI/CHF.Hyperlipidemia, Fatigue, Non-ischemic Cardiomyopathy,  -modafinil 100 BIPAP titration sleep study 12/14/21 required BIPAP 18/11 with residual minimum O2 sat 87% and mean 91.1% BIPAP 18/11, PS3/ [facility]- ordered 12/23/21 Download compliance- Body weight today                          Translator here today    Daughter (bilingual) also here  ROS-see HPI   + =  positive Constitutional:    weight loss, night sweats, fevers, chills, +fatigue, lassitude. HEENT:    headaches, difficulty swallowing, tooth/dental problems, sore throat,       +sneezing, itching, ear ache, nasal congestion, post nasal drip, snoring CV:    chest pain, orthopnea, PND, swelling in lower extremities, anasarca,            dizziness, palpitations Resp:   +shortness of breath with exertion or at rest.                productive cough,   non-productive cough, coughing up of blood.              change in color of mucus.  wheezing.   Skin:    rash or lesions. GI:  No-   heartburn, indigestion, abdominal pain, nausea, vomiting, diarrhea,                 change in bowel habits, loss of appetite GU: dysuria, change in color of urine, no urgency or frequency.   flank pain. MS:   +joint pain, stiffness, decreased range of motion, back pain. Neuro-     nothing unusual Psych:  change in mood or affect.  depression or anxiety.   memory loss.  OBJ- Physical Exam General- Alert, Oriented, Affect-appropriate, Distress- none acute, + obese, Skin- rash-none, lesions- none, excoriation- none Lymphadenopathy- none Head- atraumatic            Eyes- Gross vision intact, PERRLA, conjunctivae and secretions clear            Ears- Hearing, canals-normal  Nose- Clear, no-Septal dev, mucus, polyps, erosion, perforation             Throat- Mallampati IV , mucosa clear , drainage- none, tonsils- atrophic Neck- flexible , trachea midline, no stridor , thyroid nl, carotid no bruit Chest - symmetrical excursion , unlabored           Heart/CV- RRR , no murmur , no gallop  , no rub, nl s1 s2                           - JVD- none , edema- none, stasis changes- none, varices- none           Lung- clear to P&A, wheeze- none, cough- none , dullness-none, rub- none           Chest wall-  Abd-  Br/ Gen/ Rectal- Not done, not indicated Extrem- cyanosis- none, clubbing, none, atrophy- none, strength-  nl Neuro- grossly intact to observation

## 2022-10-15 ENCOUNTER — Other Ambulatory Visit: Payer: Self-pay

## 2022-10-15 ENCOUNTER — Emergency Department (HOSPITAL_COMMUNITY): Payer: BC Managed Care – PPO

## 2022-10-15 ENCOUNTER — Encounter (HOSPITAL_COMMUNITY): Payer: Self-pay

## 2022-10-15 ENCOUNTER — Emergency Department (HOSPITAL_COMMUNITY): Payer: BC Managed Care – PPO | Admitting: Certified Registered Nurse Anesthetist

## 2022-10-15 ENCOUNTER — Ambulatory Visit: Payer: BC Managed Care – PPO | Admitting: Internal Medicine

## 2022-10-15 ENCOUNTER — Inpatient Hospital Stay (HOSPITAL_COMMUNITY)
Admission: EM | Admit: 2022-10-15 | Discharge: 2022-10-22 | DRG: 460 | Disposition: A | Discharge status: Rehabilitation Facility | Payer: BC Managed Care – PPO | Attending: Neurosurgery | Admitting: Neurosurgery

## 2022-10-15 ENCOUNTER — Encounter (HOSPITAL_COMMUNITY)
Admission: EM | Disposition: A | Discharge status: Rehabilitation Facility | Payer: Self-pay | Source: Home / Self Care | Attending: Neurosurgery

## 2022-10-15 DIAGNOSIS — S22089A Unspecified fracture of T11-T12 vertebra, initial encounter for closed fracture: Secondary | ICD-10-CM | POA: Diagnosis present

## 2022-10-15 DIAGNOSIS — Z6841 Body Mass Index (BMI) 40.0 and over, adult: Secondary | ICD-10-CM

## 2022-10-15 DIAGNOSIS — I10 Essential (primary) hypertension: Secondary | ICD-10-CM | POA: Diagnosis present

## 2022-10-15 DIAGNOSIS — G4733 Obstructive sleep apnea (adult) (pediatric): Secondary | ICD-10-CM | POA: Diagnosis not present

## 2022-10-15 DIAGNOSIS — M545 Low back pain, unspecified: Secondary | ICD-10-CM | POA: Diagnosis not present

## 2022-10-15 DIAGNOSIS — S22078A Other fracture of T9-T10 vertebra, initial encounter for closed fracture: Principal | ICD-10-CM | POA: Diagnosis present

## 2022-10-15 DIAGNOSIS — Z8673 Personal history of transient ischemic attack (TIA), and cerebral infarction without residual deficits: Secondary | ICD-10-CM

## 2022-10-15 DIAGNOSIS — Z751 Person awaiting admission to adequate facility elsewhere: Secondary | ICD-10-CM

## 2022-10-15 DIAGNOSIS — Y92410 Unspecified street and highway as the place of occurrence of the external cause: Secondary | ICD-10-CM | POA: Diagnosis not present

## 2022-10-15 DIAGNOSIS — Z79899 Other long term (current) drug therapy: Secondary | ICD-10-CM

## 2022-10-15 DIAGNOSIS — S22079A Unspecified fracture of T9-T10 vertebra, initial encounter for closed fracture: Secondary | ICD-10-CM | POA: Diagnosis not present

## 2022-10-15 DIAGNOSIS — M2578 Osteophyte, vertebrae: Secondary | ICD-10-CM | POA: Diagnosis present

## 2022-10-15 DIAGNOSIS — S22002D Unstable burst fracture of unspecified thoracic vertebra, subsequent encounter for fracture with routine healing: Secondary | ICD-10-CM | POA: Diagnosis not present

## 2022-10-15 DIAGNOSIS — M549 Dorsalgia, unspecified: Secondary | ICD-10-CM | POA: Diagnosis not present

## 2022-10-15 DIAGNOSIS — T1490XA Injury, unspecified, initial encounter: Secondary | ICD-10-CM | POA: Diagnosis present

## 2022-10-15 DIAGNOSIS — S22009A Unspecified fracture of unspecified thoracic vertebra, initial encounter for closed fracture: Secondary | ICD-10-CM | POA: Diagnosis present

## 2022-10-15 DIAGNOSIS — Z87891 Personal history of nicotine dependence: Secondary | ICD-10-CM | POA: Diagnosis not present

## 2022-10-15 DIAGNOSIS — R5383 Other fatigue: Secondary | ICD-10-CM | POA: Diagnosis not present

## 2022-10-15 DIAGNOSIS — I700 Atherosclerosis of aorta: Secondary | ICD-10-CM | POA: Diagnosis present

## 2022-10-15 DIAGNOSIS — S22079D Unspecified fracture of T9-T10 vertebra, subsequent encounter for fracture with routine healing: Secondary | ICD-10-CM | POA: Diagnosis not present

## 2022-10-15 DIAGNOSIS — Z7982 Long term (current) use of aspirin: Secondary | ICD-10-CM

## 2022-10-15 DIAGNOSIS — E785 Hyperlipidemia, unspecified: Secondary | ICD-10-CM | POA: Diagnosis present

## 2022-10-15 DIAGNOSIS — K5900 Constipation, unspecified: Secondary | ICD-10-CM | POA: Diagnosis not present

## 2022-10-15 DIAGNOSIS — Z885 Allergy status to narcotic agent status: Secondary | ICD-10-CM

## 2022-10-15 DIAGNOSIS — S22070A Wedge compression fracture of T9-T10 vertebra, initial encounter for closed fracture: Secondary | ICD-10-CM | POA: Diagnosis not present

## 2022-10-15 HISTORY — PX: LAMINECTOMY WITH POSTERIOR LATERAL ARTHRODESIS LEVEL 3: SHX6337

## 2022-10-15 LAB — CBC WITH DIFFERENTIAL/PLATELET
Abs Immature Granulocytes: 0.08 10*3/uL — ABNORMAL HIGH (ref 0.00–0.07)
Basophils Absolute: 0.1 10*3/uL (ref 0.0–0.1)
Basophils Relative: 1 %
Eosinophils Absolute: 0.2 10*3/uL (ref 0.0–0.5)
Eosinophils Relative: 2 %
HCT: 46.5 % (ref 39.0–52.0)
Hemoglobin: 15.2 g/dL (ref 13.0–17.0)
Immature Granulocytes: 1 %
Lymphocytes Relative: 20 %
Lymphs Abs: 1.7 10*3/uL (ref 0.7–4.0)
MCH: 32.7 pg (ref 26.0–34.0)
MCHC: 32.7 g/dL (ref 30.0–36.0)
MCV: 100 fL (ref 80.0–100.0)
Monocytes Absolute: 0.8 10*3/uL (ref 0.1–1.0)
Monocytes Relative: 9 %
Neutro Abs: 5.9 10*3/uL (ref 1.7–7.7)
Neutrophils Relative %: 67 %
Platelets: 271 10*3/uL (ref 150–400)
RBC: 4.65 MIL/uL (ref 4.22–5.81)
RDW: 12.8 % (ref 11.5–15.5)
WBC: 8.8 10*3/uL (ref 4.0–10.5)
nRBC: 0 % (ref 0.0–0.2)

## 2022-10-15 LAB — NO BLOOD PRODUCTS

## 2022-10-15 LAB — BASIC METABOLIC PANEL
Anion gap: 6 (ref 5–15)
BUN: 16 mg/dL (ref 8–23)
CO2: 21 mmol/L — ABNORMAL LOW (ref 22–32)
Calcium: 8.6 mg/dL — ABNORMAL LOW (ref 8.9–10.3)
Chloride: 110 mmol/L (ref 98–111)
Creatinine, Ser: 1.17 mg/dL (ref 0.61–1.24)
GFR, Estimated: >60 mL/min (ref >60)
Glucose, Bld: 152 mg/dL — ABNORMAL HIGH (ref 70–99)
Potassium: 4.8 mmol/L (ref 3.5–5.1)
Sodium: 137 mmol/L (ref 135–145)

## 2022-10-15 LAB — SURGICAL PCR SCREEN
MRSA, PCR: NEGATIVE
Staphylococcus aureus: NEGATIVE

## 2022-10-15 SURGERY — LAMINECTOMY WITH POSTERIOR LATERAL ARTHRODESIS LEVEL 3
Anesthesia: General | Site: Back

## 2022-10-15 MED ORDER — SUGAMMADEX SODIUM 200 MG/2ML IV SOLN
INTRAVENOUS | Status: DC | PRN
  Administered 2022-10-15: 300 mg via INTRAVENOUS

## 2022-10-15 MED ORDER — LIDOCAINE 2% (20 MG/ML) 5 ML SYRINGE
INTRAMUSCULAR | Status: AC
  Filled 2022-10-15: qty 5

## 2022-10-15 MED ORDER — ROCURONIUM BROMIDE 10 MG/ML (PF) SYRINGE
PREFILLED_SYRINGE | INTRAVENOUS | Status: AC
  Filled 2022-10-15: qty 10

## 2022-10-15 MED ORDER — LACTATED RINGERS IV SOLN: INTRAVENOUS | Status: DC

## 2022-10-15 MED ORDER — BACITRACIN ZINC 500 UNIT/GM EX OINT
TOPICAL_OINTMENT | CUTANEOUS | Status: AC
  Filled 2022-10-15: qty 28.35

## 2022-10-15 MED ORDER — ORAL CARE MOUTH RINSE: 15.0000 mL | Freq: Once | OROMUCOSAL | Status: AC

## 2022-10-15 MED ORDER — LACTATED RINGERS IV SOLN: INTRAVENOUS | Status: DC | PRN

## 2022-10-15 MED ORDER — CHLORHEXIDINE GLUCONATE 0.12 % MT SOLN
15.0000 mL | Freq: Once | OROMUCOSAL | Status: AC
  Administered 2022-10-15: 15 mL via OROMUCOSAL

## 2022-10-15 MED ORDER — VANCOMYCIN HCL 1000 MG IV SOLR
INTRAVENOUS | Status: AC
  Filled 2022-10-15: qty 20

## 2022-10-15 MED ORDER — VANCOMYCIN HCL 1000 MG IV SOLR
INTRAVENOUS | Status: DC | PRN
  Administered 2022-10-15: 1000 mg via TOPICAL

## 2022-10-15 MED ORDER — CEFAZOLIN SODIUM-DEXTROSE 2-4 GM/100ML-% IV SOLN
2.0000 g | INTRAVENOUS | Status: AC
  Administered 2022-10-15: 1 g via INTRAVENOUS
  Administered 2022-10-15: 2 g via INTRAVENOUS
  Filled 2022-10-15: qty 100

## 2022-10-15 MED ORDER — SODIUM CHLORIDE (PF) 0.9 % IJ SOLN
INTRAMUSCULAR | Status: DC | PRN
  Administered 2022-10-15: 10 mL

## 2022-10-15 MED ORDER — FENTANYL CITRATE (PF) 100 MCG/2ML IJ SOLN: 25.0000 ug | INTRAMUSCULAR | Status: DC | PRN

## 2022-10-15 MED ORDER — SUCCINYLCHOLINE CHLORIDE 200 MG/10ML IV SOSY
PREFILLED_SYRINGE | INTRAVENOUS | Status: AC
  Filled 2022-10-15: qty 10

## 2022-10-15 MED ORDER — FENTANYL CITRATE (PF) 250 MCG/5ML IJ SOLN
INTRAMUSCULAR | Status: AC
  Filled 2022-10-15: qty 5

## 2022-10-15 MED ORDER — MORPHINE SULFATE (PF) 4 MG/ML IV SOLN
4.0000 mg | Freq: Once | INTRAVENOUS | Status: AC
  Administered 2022-10-15: 4 mg via INTRAVENOUS
  Filled 2022-10-15: qty 1

## 2022-10-15 MED ORDER — FENTANYL CITRATE (PF) 250 MCG/5ML IJ SOLN
INTRAMUSCULAR | Status: DC | PRN
  Administered 2022-10-15: 100 ug via INTRAVENOUS
  Administered 2022-10-15: 50 ug via INTRAVENOUS

## 2022-10-15 MED ORDER — ONDANSETRON HCL 4 MG/2ML IJ SOLN
INTRAMUSCULAR | Status: DC | PRN
  Administered 2022-10-15: 4 mg via INTRAVENOUS

## 2022-10-15 MED ORDER — BUPIVACAINE LIPOSOME 1.3 % IJ SUSP
INTRAMUSCULAR | Status: AC
  Filled 2022-10-15: qty 20

## 2022-10-15 MED ORDER — LIDOCAINE-EPINEPHRINE 1 %-1:100000 IJ SOLN
INTRAMUSCULAR | Status: DC | PRN
  Administered 2022-10-15: 10 mL via INTRADERMAL

## 2022-10-15 MED ORDER — 0.9 % SODIUM CHLORIDE (POUR BTL) OPTIME
TOPICAL | Status: DC | PRN
  Administered 2022-10-15: 1000 mL

## 2022-10-15 MED ORDER — PROPOFOL 10 MG/ML IV BOLUS
INTRAVENOUS | Status: DC | PRN
  Administered 2022-10-15: 120 mg via INTRAVENOUS

## 2022-10-15 MED ORDER — DEXAMETHASONE SODIUM PHOSPHATE 10 MG/ML IJ SOLN
INTRAMUSCULAR | Status: AC
  Filled 2022-10-15: qty 1

## 2022-10-15 MED ORDER — LIDOCAINE-EPINEPHRINE 1 %-1:100000 IJ SOLN
INTRAMUSCULAR | Status: AC
  Filled 2022-10-15: qty 1

## 2022-10-15 MED ORDER — PHENYLEPHRINE HCL (PRESSORS) 10 MG/ML IV SOLN
INTRAVENOUS | Status: AC
  Filled 2022-10-15: qty 1

## 2022-10-15 MED ORDER — PROPOFOL 10 MG/ML IV BOLUS
INTRAVENOUS | Status: AC
  Filled 2022-10-15: qty 20

## 2022-10-15 MED ORDER — PHENYLEPHRINE HCL (PRESSORS) 10 MG/ML IV SOLN
INTRAVENOUS | Status: DC | PRN
  Administered 2022-10-15: 160 ug via INTRAVENOUS

## 2022-10-15 MED ORDER — THROMBIN 5000 UNITS EX SOLR
CUTANEOUS | Status: AC
  Filled 2022-10-15: qty 5000

## 2022-10-15 MED ORDER — TRANEXAMIC ACID-NACL 1000-0.7 MG/100ML-% IV SOLN
INTRAVENOUS | Status: DC | PRN
  Administered 2022-10-15: 1000 mg via INTRAVENOUS

## 2022-10-15 MED ORDER — ALBUMIN HUMAN 5 % IV SOLN: INTRAVENOUS | Status: DC | PRN

## 2022-10-15 MED ORDER — MIDAZOLAM HCL 2 MG/2ML IJ SOLN
INTRAMUSCULAR | Status: AC
  Filled 2022-10-15: qty 2

## 2022-10-15 MED ORDER — BUPIVACAINE LIPOSOME 1.3 % IJ SUSP
INTRAMUSCULAR | Status: DC | PRN
  Administered 2022-10-15: 50 mL

## 2022-10-15 MED ORDER — FENTANYL CITRATE PF 50 MCG/ML IJ SOSY
50.0000 ug | PREFILLED_SYRINGE | Freq: Once | INTRAMUSCULAR | Status: AC | PRN
  Administered 2022-10-15: 50 ug via INTRAVENOUS
  Filled 2022-10-15: qty 1

## 2022-10-15 MED ORDER — MIDAZOLAM HCL 2 MG/2ML IJ SOLN
INTRAMUSCULAR | Status: DC | PRN
  Administered 2022-10-15: 2 mg via INTRAVENOUS

## 2022-10-15 MED ORDER — PHENYLEPHRINE HCL-NACL 20-0.9 MG/250ML-% IV SOLN
INTRAVENOUS | Status: DC | PRN
  Administered 2022-10-15: 30 ug/min via INTRAVENOUS

## 2022-10-15 MED ORDER — LIDOCAINE HCL (CARDIAC) PF 100 MG/5ML IV SOSY
PREFILLED_SYRINGE | INTRAVENOUS | Status: DC | PRN
  Administered 2022-10-15: 100 mg via INTRAVENOUS

## 2022-10-15 MED ORDER — ONDANSETRON HCL 4 MG/2ML IJ SOLN
INTRAMUSCULAR | Status: AC
  Filled 2022-10-15: qty 2

## 2022-10-15 MED ORDER — LORAZEPAM 1 MG PO TABS
0.5000 mg | ORAL_TABLET | Freq: Once | ORAL | Status: AC
  Administered 2022-10-15: 0.5 mg via ORAL
  Filled 2022-10-15: qty 1

## 2022-10-15 MED ORDER — ACETAMINOPHEN 10 MG/ML IV SOLN: 1000.0000 mg | Freq: Once | INTRAVENOUS | Status: DC | PRN

## 2022-10-15 MED ORDER — DEXAMETHASONE SODIUM PHOSPHATE 10 MG/ML IJ SOLN
INTRAMUSCULAR | Status: DC | PRN
  Administered 2022-10-15: 10 mg via INTRAVENOUS

## 2022-10-15 MED ORDER — ROCURONIUM 10MG/ML (10ML) SYRINGE FOR MEDFUSION PUMP - OPTIME
INTRAVENOUS | Status: DC | PRN
  Administered 2022-10-15 (×2): 20 mg via INTRAVENOUS
  Administered 2022-10-15: 100 mg via INTRAVENOUS

## 2022-10-15 MED ORDER — CEFAZOLIN SODIUM 1 G IJ SOLR
INTRAMUSCULAR | Status: AC
  Filled 2022-10-15: qty 10

## 2022-10-15 MED ORDER — ONDANSETRON HCL 4 MG/2ML IJ SOLN
4.0000 mg | Freq: Once | INTRAMUSCULAR | Status: AC
  Administered 2022-10-15: 4 mg via INTRAVENOUS
  Filled 2022-10-15: qty 2

## 2022-10-15 MED ORDER — THROMBIN 5000 UNITS EX SOLR
OROMUCOSAL | Status: DC | PRN
  Administered 2022-10-15: 5 mL via TOPICAL

## 2022-10-15 MED ORDER — BUPIVACAINE HCL (PF) 0.5 % IJ SOLN
INTRAMUSCULAR | Status: AC
  Filled 2022-10-15: qty 30

## 2022-10-15 SURGICAL SUPPLY — 75 items
ADH SKN CLS LQ APL DERMABOND (GAUZE/BANDAGES/DRESSINGS) ×2
BAG COUNTER SPONGE SURGICOUNT (BAG) ×2 IMPLANT
BAG SPNG CNTER NS LX DISP (BAG) ×2
BLADE CLIPPER SURG (BLADE) IMPLANT
BUR 14 MATCH 3 (BUR) IMPLANT
BUR MR8 14 BALL 5 (BUR) IMPLANT
BUR MR8 14CM BALL SYMTRI 3 (BUR) IMPLANT
BURR 14 MATCH 3 (BUR) ×2
BURR MR8 14 BALL 5 (BUR)
BURR MR8 14CM BALL SYMTRI 3 (BUR) ×2
CANISTER SUCT 3000ML PPV (MISCELLANEOUS) ×2 IMPLANT
CNTNR URN SCR LID CUP LEK RST (MISCELLANEOUS) IMPLANT
CONT SPEC 4OZ STRL OR WHT (MISCELLANEOUS)
COVERAGE SUPPORT O-ARM STEALTH (MISCELLANEOUS) ×2 IMPLANT
DERMABOND ADVANCED .7 DNX6 (GAUZE/BANDAGES/DRESSINGS) IMPLANT
DRAIN JACKSON PRATT 10MM FLAT (MISCELLANEOUS) IMPLANT
DRAPE C-ARM 42X72 X-RAY (DRAPES) ×4 IMPLANT
DRAPE LAPAROTOMY 100X72X124 (DRAPES) ×2 IMPLANT
DRAPE SHEET LG 3/4 BI-LAMINATE (DRAPES) ×8 IMPLANT
DRAPE SURG 17X23 STRL (DRAPES) ×2 IMPLANT
DRSG OPSITE POSTOP 4X10 (GAUZE/BANDAGES/DRESSINGS) IMPLANT
DURAPREP 26ML APPLICATOR (WOUND CARE) ×2 IMPLANT
ELECT BLADE INSULATED 6.5IN (ELECTROSURGICAL) ×2
ELECT REM PT RETURN 9FT ADLT (ELECTROSURGICAL) ×2
ELECTRODE BLDE INSULATED 6.5IN (ELECTROSURGICAL) ×2 IMPLANT
ELECTRODE REM PT RTRN 9FT ADLT (ELECTROSURGICAL) ×2 IMPLANT
EVACUATOR SILICONE 100CC (DRAIN) IMPLANT
FEE COVERAGE SUPPORT O-ARM (MISCELLANEOUS) ×2 IMPLANT
GAUZE 4X4 16PLY ~~LOC~~+RFID DBL (SPONGE) IMPLANT
GAUZE SPONGE 4X4 12PLY STRL (GAUZE/BANDAGES/DRESSINGS) IMPLANT
GLOVE BIO SURGEON STRL SZ7 (GLOVE) ×8 IMPLANT
GLOVE BIOGEL PI IND STRL 7.0 (GLOVE) ×4 IMPLANT
GLOVE BIOGEL PI IND STRL 7.5 (GLOVE) ×6 IMPLANT
GLOVE ECLIPSE 7.5 STRL STRAW (GLOVE) ×2 IMPLANT
GOWN STRL REUS W/ TWL LRG LVL3 (GOWN DISPOSABLE) ×4 IMPLANT
GOWN STRL REUS W/ TWL XL LVL3 (GOWN DISPOSABLE) ×4 IMPLANT
GOWN STRL REUS W/TWL 2XL LVL3 (GOWN DISPOSABLE) IMPLANT
GOWN STRL REUS W/TWL LRG LVL3 (GOWN DISPOSABLE) ×4
GOWN STRL REUS W/TWL XL LVL3 (GOWN DISPOSABLE) ×4
GRAFT BN 10X1XDBM MAGNIFUSE (Bone Implant) IMPLANT
GRAFT BONE MAGNIFUSE 1X10CM (Bone Implant) ×2 IMPLANT
HEMOSTAT POWDER KIT SURGIFOAM (HEMOSTASIS) ×2 IMPLANT
KIT BASIN OR (CUSTOM PROCEDURE TRAY) ×2 IMPLANT
KIT TURNOVER KIT B (KITS) ×2 IMPLANT
MARKER SKIN DUAL TIP RULER LAB (MISCELLANEOUS) IMPLANT
MARKER SPHERE PSV REFLC NDI (MISCELLANEOUS) ×10 IMPLANT
NDL HYPO 18GX1.5 BLUNT FILL (NEEDLE) IMPLANT
NDL HYPO 21X1.5 SAFETY (NEEDLE) IMPLANT
NEEDLE HYPO 18GX1.5 BLUNT FILL (NEEDLE) IMPLANT
NEEDLE HYPO 21X1.5 SAFETY (NEEDLE) ×2 IMPLANT
NEEDLE HYPO 22GX1.5 SAFETY (NEEDLE) ×2 IMPLANT
NS IRRIG 1000ML POUR BTL (IV SOLUTION) ×2 IMPLANT
PACK LAMINECTOMY NEURO (CUSTOM PROCEDURE TRAY) ×2 IMPLANT
PAD ARMBOARD 7.5X6 YLW CONV (MISCELLANEOUS) ×6 IMPLANT
ROD 5.5MM SPINAL SOLERA (Rod) IMPLANT
SCREW FNS MA 5.5X50 (Screw) IMPLANT
SCREW SET SOLERA (Screw) ×16 IMPLANT
SCREW SET SOLERA TI5.5 (Screw) IMPLANT
SCREW SOLERA 45X5.5XMA (Screw) IMPLANT
SCREW SOLERA 5.5X45MM (Screw) ×4 IMPLANT
SEALER BIPOLAR AQUA 6.0 (INSTRUMENTS) IMPLANT
SOL ELECTROSURG ANTI STICK (MISCELLANEOUS) ×4
SOLUTION ELECTROSURG ANTI STCK (MISCELLANEOUS) ×4 IMPLANT
SPIKE FLUID TRANSFER (MISCELLANEOUS) ×2 IMPLANT
SPONGE SURGIFOAM ABS GEL SZ50 (HEMOSTASIS) IMPLANT
SPONGE T-LAP 4X18 ~~LOC~~+RFID (SPONGE) IMPLANT
STAPLER VISISTAT 35W (STAPLE) IMPLANT
SUT MNCRL AB 4-0 PS2 18 (SUTURE) ×2 IMPLANT
SUT VIC AB 0 CT1 18XCR BRD8 (SUTURE) IMPLANT
SUT VIC AB 0 CT1 8-18 (SUTURE) ×4
SUT VIC AB 2-0 CP2 18 (SUTURE) ×2 IMPLANT
SYR 30ML LL (SYRINGE) IMPLANT
TOWEL GREEN STERILE (TOWEL DISPOSABLE) ×2 IMPLANT
TOWEL GREEN STERILE FF (TOWEL DISPOSABLE) ×2 IMPLANT
WATER STERILE IRR 1000ML POUR (IV SOLUTION) ×2 IMPLANT

## 2022-10-15 NOTE — Progress Notes (Signed)
Neurosurgery  I discussed the proposed procedure T9 through 12 posterior instrumented fusion and reduction of fracture. General technique of surgery, as well as Risks, benefits, alternative, and expected convalescence were discussed with the patient. Risks discussed included, but were not limited to bleeding, pain, infection, scar, instability, neurologic deficit, and death. Informed consent was obtained.

## 2022-10-15 NOTE — Transfer of Care (Signed)
Immediate Anesthesia Transfer of Care Note  Patient: Nicholas Potts  Procedure(s) Performed: THORACIC NINE-THORACIC TWELVE POSTERIOR INSTRUMENTED FUSION REDUCTION OF FRACTURE (Back) Application of O-Arm  Patient Location: PACU  Anesthesia Type:General  Level of Consciousness: awake and sedated  Airway & Oxygen Therapy:  C-PAP  Post-op Assessment: Report given to RN and Post -op Vital signs reviewed and stable  Post vital signs: Reviewed and stable  Last Vitals:  Vitals Value Taken Time  BP 122/66 10/15/22 2330  Temp 36.6 C 10/15/22 2327  Pulse 88 10/15/22 2334  Resp 10 10/15/22 2334  SpO2 96 % 10/15/22 2334  Vitals shown include unvalidated device data.  Last Pain:  Vitals:   10/15/22 2327  TempSrc:   PainSc: Asleep         Complications: No notable events documented.
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
## 2022-10-15 NOTE — Anesthesia Postprocedure Evaluation (Signed)
Anesthesia Post Note  Patient: Nicholas Potts  Procedure(s) Performed: THORACIC NINE-THORACIC TWELVE POSTERIOR INSTRUMENTED FUSION REDUCTION OF FRACTURE (Back) Application of O-Arm     Patient location during evaluation: PACU Anesthesia Type: General Level of consciousness: sedated Pain management: pain level controlled Vital Signs Assessment: post-procedure vital signs reviewed and stable Respiratory status: spontaneous breathing and respiratory function stable Cardiovascular status: stable Postop Assessment: no apparent nausea or vomiting Anesthetic complications: no  No notable events documented.  Last Vitals:  Vitals:   10/15/22 2330 10/15/22 2342  BP: 122/66 116/74  Pulse: 89 90  Resp: 14 14  Temp:    SpO2: 97% 95%    Last Pain:  Vitals:   10/15/22 2342  TempSrc:   PainSc: Asleep                 Deaundra Kutzer DANIEL

## 2022-10-15 NOTE — ED Notes (Signed)
Patient transported to MRI 

## 2022-10-15 NOTE — Anesthesia Procedure Notes (Signed)
Procedure Name: Intubation Date/Time: 10/15/2022 7:55 PM  Performed by: Molli Hazard, CRNAPre-anesthesia Checklist: Patient identified, Emergency Drugs available, Suction available and Patient being monitored Patient Re-evaluated:Patient Re-evaluated prior to induction Oxygen Delivery Method: Circle system utilized Preoxygenation: Pre-oxygenation with 100% oxygen Induction Type: IV induction Ventilation: Two handed mask ventilation required and Oral airway inserted - appropriate to patient size Laryngoscope Size: Glidescope and 4 Grade View: Grade I Tube type: Oral Tube size: 7.5 mm Number of attempts: 1 Airway Equipment and Method: Stylet Placement Confirmation: ETT inserted through vocal cords under direct vision, positive ETCO2 and breath sounds checked- equal and bilateral Secured at: 22 cm Tube secured with: Tape Dental Injury: Teeth and Oropharynx as per pre-operative assessment
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
## 2022-10-16 ENCOUNTER — Encounter (HOSPITAL_COMMUNITY): Payer: Self-pay | Admitting: Neurosurgery

## 2022-10-16 DIAGNOSIS — S22009A Unspecified fracture of unspecified thoracic vertebra, initial encounter for closed fracture: Secondary | ICD-10-CM | POA: Diagnosis present

## 2022-10-16 LAB — BASIC METABOLIC PANEL
Anion gap: 10 (ref 5–15)
BUN: 11 mg/dL (ref 8–23)
CO2: 23 mmol/L (ref 22–32)
Calcium: 8.6 mg/dL — ABNORMAL LOW (ref 8.9–10.3)
Chloride: 105 mmol/L (ref 98–111)
Creatinine, Ser: 0.97 mg/dL (ref 0.61–1.24)
GFR, Estimated: >60 mL/min (ref >60)
Glucose, Bld: 183 mg/dL — ABNORMAL HIGH (ref 70–99)
Potassium: 4.4 mmol/L (ref 3.5–5.1)
Sodium: 138 mmol/L (ref 135–145)

## 2022-10-16 LAB — MRSA NEXT GEN BY PCR, NASAL: MRSA by PCR Next Gen: NOT DETECTED

## 2022-10-16 MED ORDER — SODIUM CHLORIDE 0.9% FLUSH: 3.0000 mL | INTRAVENOUS | Status: DC | PRN

## 2022-10-16 MED ORDER — HYDROCODONE-ACETAMINOPHEN 5-325 MG PO TABS
1.0000 | ORAL_TABLET | ORAL | Status: DC | PRN
  Administered 2022-10-18 – 2022-10-20 (×2): 1 via ORAL
  Filled 2022-10-16 (×2): qty 1

## 2022-10-16 MED ORDER — MENTHOL 3 MG MT LOZG: 1.0000 | LOZENGE | OROMUCOSAL | Status: DC | PRN

## 2022-10-16 MED ORDER — ONDANSETRON HCL 4 MG/2ML IJ SOLN: 4.0000 mg | Freq: Four times a day (QID) | INTRAMUSCULAR | Status: DC | PRN

## 2022-10-16 MED ORDER — ACETAMINOPHEN 650 MG RE SUPP: 650.0000 mg | RECTAL | Status: DC | PRN

## 2022-10-16 MED ORDER — HYDROCODONE-ACETAMINOPHEN 10-325 MG PO TABS
2.0000 | ORAL_TABLET | ORAL | Status: DC | PRN
  Administered 2022-10-16 – 2022-10-22 (×24): 2 via ORAL
  Filled 2022-10-16 (×25): qty 2

## 2022-10-16 MED ORDER — ACETAMINOPHEN 325 MG PO TABS
650.0000 mg | ORAL_TABLET | ORAL | Status: DC | PRN
  Administered 2022-10-16 – 2022-10-21 (×4): 650 mg via ORAL
  Filled 2022-10-16 (×5): qty 2

## 2022-10-16 MED ORDER — METHOCARBAMOL 500 MG PO TABS
500.0000 mg | ORAL_TABLET | Freq: Four times a day (QID) | ORAL | Status: DC | PRN
  Administered 2022-10-16 – 2022-10-21 (×12): 500 mg via ORAL
  Filled 2022-10-16 (×12): qty 1

## 2022-10-16 MED ORDER — METHOCARBAMOL 1000 MG/10ML IJ SOLN: 500.0000 mg | Freq: Four times a day (QID) | INTRAVENOUS | Status: DC | PRN

## 2022-10-16 MED ORDER — HYDROMORPHONE HCL 1 MG/ML IJ SOLN
0.5000 mg | INTRAMUSCULAR | Status: DC | PRN
  Administered 2022-10-16 – 2022-10-21 (×22): 0.5 mg via INTRAVENOUS
  Filled 2022-10-16 (×3): qty 0.5
  Filled 2022-10-16: qty 1
  Filled 2022-10-16 (×15): qty 0.5
  Filled 2022-10-16: qty 1
  Filled 2022-10-16 (×2): qty 0.5

## 2022-10-16 MED ORDER — POTASSIUM CHLORIDE IN NACL 20-0.9 MEQ/L-% IV SOLN
INTRAVENOUS | Status: DC
  Filled 2022-10-16: qty 1000

## 2022-10-16 MED ORDER — PHENOL 1.4 % MT LIQD: 1.0000 | OROMUCOSAL | Status: DC | PRN

## 2022-10-16 MED ORDER — SODIUM CHLORIDE 0.9 % IV SOLN: 250.0000 mL | INTRAVENOUS | Status: DC

## 2022-10-16 MED ORDER — ONDANSETRON HCL 4 MG PO TABS: 4.0000 mg | ORAL_TABLET | Freq: Four times a day (QID) | ORAL | Status: DC | PRN

## 2022-10-16 MED ORDER — DOCUSATE SODIUM 100 MG PO CAPS
100.0000 mg | ORAL_CAPSULE | Freq: Two times a day (BID) | ORAL | Status: DC
  Administered 2022-10-16 – 2022-10-22 (×14): 100 mg via ORAL
  Filled 2022-10-16 (×14): qty 1

## 2022-10-16 MED ORDER — ATORVASTATIN CALCIUM 10 MG PO TABS
10.0000 mg | ORAL_TABLET | Freq: Every day | ORAL | Status: DC
  Administered 2022-10-16 – 2022-10-21 (×7): 10 mg via ORAL
  Filled 2022-10-16 (×7): qty 1

## 2022-10-16 MED ORDER — SODIUM CHLORIDE 0.9% FLUSH
3.0000 mL | Freq: Two times a day (BID) | INTRAVENOUS | Status: DC
  Administered 2022-10-16 – 2022-10-22 (×14): 3 mL via INTRAVENOUS

## 2022-10-16 MED ORDER — ENOXAPARIN SODIUM 40 MG/0.4ML IJ SOSY
40.0000 mg | PREFILLED_SYRINGE | INTRAMUSCULAR | Status: DC
  Administered 2022-10-16 – 2022-10-22 (×7): 40 mg via SUBCUTANEOUS
  Filled 2022-10-16 (×7): qty 0.4

## 2022-10-16 MED ORDER — POLYETHYLENE GLYCOL 3350 17 G PO PACK
17.0000 g | PACK | Freq: Every day | ORAL | Status: DC | PRN
  Administered 2022-10-18 – 2022-10-22 (×4): 17 g via ORAL
  Filled 2022-10-16 (×7): qty 1

## 2022-10-16 MED ORDER — ORAL CARE MOUTH RINSE: 15.0000 mL | OROMUCOSAL | Status: DC | PRN

## 2022-10-16 MED ORDER — ADULT MULTIVITAMIN W/MINERALS CH
1.0000 | ORAL_TABLET | Freq: Every day | ORAL | Status: DC
  Administered 2022-10-16 – 2022-10-22 (×7): 1 via ORAL
  Filled 2022-10-16 (×7): qty 1

## 2022-10-16 MED ORDER — CEFAZOLIN SODIUM-DEXTROSE 1-4 GM/50ML-% IV SOLN
1.0000 g | Freq: Three times a day (TID) | INTRAVENOUS | Status: AC
  Administered 2022-10-16 – 2022-10-17 (×4): 1 g via INTRAVENOUS
  Filled 2022-10-16 (×5): qty 50

## 2022-10-16 MED ORDER — FLEET ENEMA 7-19 GM/118ML RE ENEM
1.0000 | ENEMA | Freq: Once | RECTAL | Status: AC | PRN
  Administered 2022-10-18: 1 via RECTAL
  Filled 2022-10-16: qty 1

## 2022-10-16 MED ORDER — CHLORHEXIDINE GLUCONATE CLOTH 2 % EX PADS: 6.0000 | MEDICATED_PAD | Freq: Every day | CUTANEOUS | Status: DC
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
## 2022-10-16 NOTE — Progress Notes (Signed)
Orthopedic Tech Progress Note Patient Details:  Nicholas Potts 11/14/1960 [id number] Left brace in room Ortho Devices Type of Ortho Device: Thoracolumbar corset (TLSO) Ortho Device/Splint Interventions: Ordered      Bella Kennedy A Karrina Lye 10/16/2022, 10:33 AM

## 2022-10-16 NOTE — Plan of Care (Signed)
  Problem: Education: Goal: Knowledge of General Education information will improve Description: Including pain rating scale, medication(s)/side effects and non-pharmacologic comfort measures Outcome: Progressing   Problem: Health Behavior/Discharge Planning: Goal: Ability to manage health-related needs will improve Outcome: Progressing   Problem: Nutrition: Goal: Adequate nutrition will be maintained Outcome: Progressing   Problem: Coping: Goal: Level of anxiety will decrease Outcome: Progressing   Problem: Safety: Goal: Ability to remain free from injury will improve Outcome: Progressing   Problem: Activity: Goal: Will remain free from falls Outcome: Progressing

## 2022-10-16 NOTE — Progress Notes (Signed)
Inpatient Rehab Admissions Coordinator Note:   Per therapy recommendations patient was screened for CIR candidacy by Stephania Fragmin, PT. At this time, pt appears to be a potential candidate for CIR. I will place an order for rehab consult for full assessment, per our protocol.  Please contact me any with questions.Estill Dooms, PT, DPT [phone] 10/16/22 12:01 PM
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
## 2022-10-17 NOTE — Progress Notes (Signed)
RT placed patient on CPAP HS. 2L O2 bleed in needed. Patient tolerating well at this time. 
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
## 2022-10-17 NOTE — Progress Notes (Signed)
Inpatient Rehab Admissions Coordinator:    I met with pt. And daughter to discuss potential CIR admit. Pt.'s daughter Chanda Busing, states that she is interested in CIR for Pt. And that family can rotate to provide 24/7 assist. I will follow and send case to insurance once Pt. Is medically stable.   Megan Salon, MS, CCC-SLP Rehab Admissions Coordinator  [phone] (celll) [phone] (office)
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
## 2022-10-18 NOTE — Progress Notes (Signed)
Pt tolerated CPAP until about 0030, placed back on 2L [place]
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
## 2022-10-18 NOTE — Progress Notes (Signed)
Pt states that yesterday, not now, his tongue was swelling but forgot to mention it and it passed, RN aware, MD contacted via phone to office line - pt currently itching

## 2022-10-18 NOTE — Plan of Care (Signed)
  Problem: Clinical Measurements: Goal: Ability to maintain clinical measurements within normal limits will improve Outcome: Progressing   Problem: Activity: Goal: Risk for activity intolerance will decrease Outcome: Progressing   Problem: Pain Managment: Goal: General experience of comfort will improve Outcome: Progressing   Problem: Education: Goal: Ability to verbalize activity precautions or restrictions will improve Outcome: Progressing   Problem: Activity: Goal: Ability to avoid complications of mobility impairment will improve Outcome: Progressing Goal: Ability to tolerate increased activity will improve Outcome: Progressing   Problem: Pain Management: Goal: Pain level will decrease Outcome: Progressing

## 2022-10-18 NOTE — Progress Notes (Signed)
Patient placed himself on home CPAP for the night.  
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
## 2022-10-19 NOTE — Progress Notes (Signed)
Patient placed himself on home CPAP for the night.  
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
## 2022-10-20 MED ORDER — DIPHENHYDRAMINE HCL 25 MG PO CAPS
25.0000 mg | ORAL_CAPSULE | ORAL | Status: DC | PRN
  Administered 2022-10-20 – 2022-10-22 (×2): 25 mg via ORAL
  Filled 2022-10-20 (×2): qty 1

## 2022-10-20 NOTE — Progress Notes (Signed)
  Transition of Care [facility]) Screening Note   Patient Details  Name: Dyle Renfro Date of Birth: 1961/03/13   Transition of Care [facility]) CM/SW Contact:    Darrold Span, RN Phone Number: 10/20/2022, 3:08 PM    Transition of Care Department [facility]) has reviewed patient and note CIR following for possible admission pending insurance authorization. We will continue to monitor patient advancement through interdisciplinary progression rounds. If new patient transition needs arise, please place a TOC consult.

## 2022-10-20 NOTE — Progress Notes (Signed)
Inpatient Rehab Admissions Coordinator:   Met with patient and his daughter to review benefits if patient's insurance authorizes inpatient rehab admission. Patient and daughter are agreeable and still would like to pursue CIR. Continue to wait for insurance determination and bed availability.   Rehab Admissons Coordinator [place], [place], [place] [phone]
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
## 2022-10-20 NOTE — Progress Notes (Signed)
RT administered oxygen at 3L into home cpap machine due to pt desaturation during sleep. Pt now saturating 96% and tolerating well.

## 2022-10-21 MED ORDER — BISACODYL 10 MG RE SUPP
10.0000 mg | Freq: Every day | RECTAL | Status: DC | PRN
  Administered 2022-10-22: 10 mg via RECTAL
  Filled 2022-10-21: qty 1

## 2022-10-21 MED ORDER — MAGNESIUM CITRATE PO SOLN
1.0000 | Freq: Once | ORAL | Status: AC
  Administered 2022-10-21: 1 via ORAL
  Filled 2022-10-21: qty 296

## 2022-10-21 NOTE — Progress Notes (Signed)
Home CPAP, self manages, not on cpap at this time

## 2022-10-21 NOTE — Progress Notes (Signed)
IP rehab admissions - We have approval from [facility] for CIR admission.  We do not have a bed available for this patient on CIR today.  I will follow up tomorrow for bed availability and potential CIR admission.  Call for questions.  [phone]

## 2022-10-21 NOTE — Progress Notes (Addendum)
Patient complains of a 8/10 headache this morning and afternoon. Patient states that nobody put his Bipap on him last night. I called respiratory and they came to set up his bipap this morning so he could get more sleep. Respiratory said that his machine is not working so they brought one up to his room and placed on patient. Will continue to monitor oxygen and pain.

## 2022-10-21 NOTE — Progress Notes (Signed)
S: No issues overnight.   O: EXAM:  BP 93/72 (BP Location: Left Arm)   Pulse (!) 109   Temp 98.6 F (37 C) (Axillary)   Resp 18   Ht 5\' 7"  (1.702 m)   Wt 117.9 kg   SpO2 90%   BMI 40.72 kg/m     Awake, alert, oriented  Speech fluent, appropriate  SILTx4 5/5 BUE/BLE  Incision c/d/i   ASSESSMENT:  62 y.o. male POD#6 s/p T9-12 ORIF for fracture, recovering well    PLAN: - Continue PT/OT -Awaiting placement, likely CIR.    Patrici Ranks, [facility]
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
## 2022-10-22 ENCOUNTER — Inpatient Hospital Stay (HOSPITAL_COMMUNITY)
Admission: RE | Admit: 2022-10-22 | Discharge: 2022-10-31 | DRG: 560 | Disposition: A | Discharge status: Home / Self Care | Payer: BC Managed Care – PPO | Source: Intra-hospital | Attending: Physical Medicine and Rehabilitation | Admitting: Physical Medicine and Rehabilitation

## 2022-10-22 ENCOUNTER — Encounter (HOSPITAL_COMMUNITY): Payer: Self-pay | Admitting: Physical Medicine and Rehabilitation

## 2022-10-22 ENCOUNTER — Other Ambulatory Visit: Payer: Self-pay

## 2022-10-22 DIAGNOSIS — S22079A Unspecified fracture of T9-T10 vertebra, initial encounter for closed fracture: Secondary | ICD-10-CM | POA: Diagnosis not present

## 2022-10-22 DIAGNOSIS — Z87891 Personal history of nicotine dependence: Secondary | ICD-10-CM | POA: Diagnosis not present

## 2022-10-22 DIAGNOSIS — Z6841 Body Mass Index (BMI) 40.0 and over, adult: Secondary | ICD-10-CM | POA: Diagnosis not present

## 2022-10-22 DIAGNOSIS — I2510 Atherosclerotic heart disease of native coronary artery without angina pectoris: Secondary | ICD-10-CM | POA: Diagnosis present

## 2022-10-22 DIAGNOSIS — S22079D Unspecified fracture of T9-T10 vertebra, subsequent encounter for fracture with routine healing: Principal | ICD-10-CM

## 2022-10-22 DIAGNOSIS — S22089D Unspecified fracture of T11-T12 vertebra, subsequent encounter for fracture with routine healing: Secondary | ICD-10-CM | POA: Diagnosis not present

## 2022-10-22 DIAGNOSIS — I10 Essential (primary) hypertension: Secondary | ICD-10-CM | POA: Diagnosis present

## 2022-10-22 DIAGNOSIS — R5383 Other fatigue: Secondary | ICD-10-CM | POA: Diagnosis present

## 2022-10-22 DIAGNOSIS — Z981 Arthrodesis status: Secondary | ICD-10-CM | POA: Diagnosis not present

## 2022-10-22 DIAGNOSIS — Z79899 Other long term (current) drug therapy: Secondary | ICD-10-CM

## 2022-10-22 DIAGNOSIS — Z8673 Personal history of transient ischemic attack (TIA), and cerebral infarction without residual deficits: Secondary | ICD-10-CM | POA: Diagnosis not present

## 2022-10-22 DIAGNOSIS — S22078A Other fracture of T9-T10 vertebra, initial encounter for closed fracture: Secondary | ICD-10-CM | POA: Diagnosis not present

## 2022-10-22 DIAGNOSIS — S24109A Unspecified injury at unspecified level of thoracic spinal cord, initial encounter: Secondary | ICD-10-CM | POA: Insufficient documentation

## 2022-10-22 DIAGNOSIS — M549 Dorsalgia, unspecified: Secondary | ICD-10-CM | POA: Diagnosis present

## 2022-10-22 DIAGNOSIS — K5900 Constipation, unspecified: Secondary | ICD-10-CM | POA: Diagnosis present

## 2022-10-22 DIAGNOSIS — S22009A Unspecified fracture of unspecified thoracic vertebra, initial encounter for closed fracture: Principal | ICD-10-CM | POA: Diagnosis present

## 2022-10-22 DIAGNOSIS — G4733 Obstructive sleep apnea (adult) (pediatric): Secondary | ICD-10-CM | POA: Diagnosis present

## 2022-10-22 DIAGNOSIS — R2689 Other abnormalities of gait and mobility: Secondary | ICD-10-CM | POA: Diagnosis present

## 2022-10-22 DIAGNOSIS — Z8719 Personal history of other diseases of the digestive system: Secondary | ICD-10-CM | POA: Diagnosis not present

## 2022-10-22 DIAGNOSIS — S22070A Wedge compression fracture of T9-T10 vertebra, initial encounter for closed fracture: Secondary | ICD-10-CM | POA: Diagnosis not present

## 2022-10-22 DIAGNOSIS — S22002D Unstable burst fracture of unspecified thoracic vertebra, subsequent encounter for fracture with routine healing: Secondary | ICD-10-CM | POA: Diagnosis not present

## 2022-10-22 DIAGNOSIS — E785 Hyperlipidemia, unspecified: Secondary | ICD-10-CM | POA: Diagnosis present

## 2022-10-22 LAB — GLUCOSE, CAPILLARY
Glucose-Capillary: 105 mg/dL — ABNORMAL HIGH (ref 70–99)
Glucose-Capillary: 220 mg/dL — ABNORMAL HIGH (ref 70–99)

## 2022-10-22 MED ORDER — ONDANSETRON HCL 4 MG PO TABS: 4.0000 mg | ORAL_TABLET | Freq: Four times a day (QID) | ORAL | Status: DC | PRN

## 2022-10-22 MED ORDER — ENOXAPARIN SODIUM 40 MG/0.4ML IJ SOSY
40.0000 mg | PREFILLED_SYRINGE | INTRAMUSCULAR | Status: DC
  Administered 2022-10-23 – 2022-10-30 (×8): 40 mg via SUBCUTANEOUS
  Filled 2022-10-22 (×8): qty 0.4

## 2022-10-22 MED ORDER — METHOCARBAMOL 500 MG PO TABS
500.0000 mg | ORAL_TABLET | Freq: Four times a day (QID) | ORAL | Status: DC | PRN
  Administered 2022-10-22 – 2022-10-31 (×20): 500 mg via ORAL
  Filled 2022-10-22 (×21): qty 1

## 2022-10-22 MED ORDER — TRAMADOL HCL 50 MG PO TABS
50.0000 mg | ORAL_TABLET | Freq: Four times a day (QID) | ORAL | Status: DC
  Administered 2022-10-22 – 2022-10-23 (×2): 50 mg via ORAL
  Filled 2022-10-22 (×4): qty 1

## 2022-10-22 MED ORDER — HYDROCODONE-ACETAMINOPHEN 10-325 MG PO TABS: 1.0000 | ORAL_TABLET | ORAL | 0 refills | Status: DC | PRN

## 2022-10-22 MED ORDER — DIPHENHYDRAMINE HCL 25 MG PO CAPS: 25.0000 mg | ORAL_CAPSULE | Freq: Four times a day (QID) | ORAL | Status: DC | PRN

## 2022-10-22 MED ORDER — ACETAMINOPHEN 325 MG PO TABS
325.0000 mg | ORAL_TABLET | ORAL | Status: DC | PRN
  Administered 2022-10-22 – 2022-10-30 (×13): 650 mg via ORAL
  Filled 2022-10-22 (×13): qty 2

## 2022-10-22 MED ORDER — ATORVASTATIN CALCIUM 10 MG PO TABS
10.0000 mg | ORAL_TABLET | Freq: Every day | ORAL | Status: DC
  Administered 2022-10-22 – 2022-10-30 (×9): 10 mg via ORAL
  Filled 2022-10-22 (×9): qty 1

## 2022-10-22 MED ORDER — ONDANSETRON HCL 4 MG/2ML IJ SOLN: 4.0000 mg | Freq: Four times a day (QID) | INTRAMUSCULAR | Status: DC | PRN

## 2022-10-22 MED ORDER — ENOXAPARIN SODIUM 40 MG/0.4ML IJ SOSY: 40.0000 mg | PREFILLED_SYRINGE | INTRAMUSCULAR | Status: DC

## 2022-10-22 MED ORDER — HYDROCODONE-ACETAMINOPHEN 10-325 MG PO TABS
2.0000 | ORAL_TABLET | ORAL | Status: DC | PRN
  Administered 2022-10-22: 2 via ORAL
  Filled 2022-10-22: qty 2

## 2022-10-22 MED ORDER — HYDROCODONE-ACETAMINOPHEN 5-325 MG PO TABS
1.0000 | ORAL_TABLET | ORAL | Status: DC | PRN
  Administered 2022-10-23: 1 via ORAL
  Filled 2022-10-22: qty 1

## 2022-10-22 MED ORDER — GUAIFENESIN-DM 100-10 MG/5ML PO SYRP: 10.0000 mL | ORAL_SOLUTION | Freq: Four times a day (QID) | ORAL | Status: DC | PRN

## 2022-10-22 MED ORDER — DOCUSATE SODIUM 100 MG PO CAPS
100.0000 mg | ORAL_CAPSULE | Freq: Two times a day (BID) | ORAL | Status: DC
  Administered 2022-10-22 – 2022-10-31 (×14): 100 mg via ORAL
  Filled 2022-10-22 (×17): qty 1

## 2022-10-22 MED ORDER — ALUM & MAG HYDROXIDE-SIMETH 200-200-20 MG/5ML PO SUSP: 30.0000 mL | ORAL | Status: DC | PRN

## 2022-10-22 MED ORDER — SORBITOL 70 % SOLN
60.0000 mL | Freq: Once | Status: AC
  Administered 2022-10-22: 60 mL via ORAL
  Filled 2022-10-22: qty 60

## 2022-10-22 MED ORDER — TRAZODONE HCL 50 MG PO TABS
50.0000 mg | ORAL_TABLET | Freq: Every evening | ORAL | Status: DC | PRN
  Administered 2022-10-25 – 2022-10-30 (×3): 50 mg via ORAL
  Filled 2022-10-22 (×3): qty 1

## 2022-10-22 MED ORDER — ADULT MULTIVITAMIN W/MINERALS CH
1.0000 | ORAL_TABLET | Freq: Every day | ORAL | Status: DC
  Administered 2022-10-23 – 2022-10-31 (×9): 1 via ORAL
  Filled 2022-10-22 (×9): qty 1
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
## 2022-10-22 NOTE — Progress Notes (Signed)
Orthopedic Tech Progress Note Patient Details:  Nicholas Potts 01/06/1961 [id number]  Pt was provided a TLSO back brace on 10/16/22.  Patient ID: Nicholas Potts, male   DOB: 03/19/1961, 62 y.o.   MRN: [id number]  Docia Furl 10/22/2022, 5:16 PM

## 2022-10-22 NOTE — Progress Notes (Signed)
Inpatient Rehabilitation Admission Medication Review by a Pharmacist  A complete drug regimen review was completed for this patient to identify any potential clinically significant medication issues.  High Risk Drug Classes Is patient taking? Indication by Medication  Antipsychotic No   Anticoagulant Yes Lovenox- vte ppx  Antibiotic No   Opioid Yes Norco- acute pain  Antiplatelet No   Hypoglycemics/insulin No   Vasoactive Medication No   Chemotherapy No   Other Yes Benadryl- itching Trazodone- sleep Robaxin- muscle spasms Lipitor- HLD     Type of Medication Issue Identified Description of Issue Recommendation(s)  Drug Interaction(s) (clinically significant)     Duplicate Therapy     Allergy     No Medication Administration End Date     Incorrect Dose     Additional Drug Therapy Needed     Significant med changes from prior encounter (inform family/care partners about these prior to discharge).    Other  PTA meds: Provigil Restart PTA meds when and if necessary during CIR admission or at time of discharge, if warranted    Clinically significant medication issues were identified that warrant physician communication and completion of prescribed/recommended actions by midnight of the next day:  No   Time spent performing this drug regimen review (minutes):  30   Atlee Kluth BS, PharmD, BCPS Clinical Pharmacist 10/22/2022 2:35 PM  Contact: [phone] after 3 PM  "Be curious, not judgmental..." -Debbora Dus

## 2022-10-22 NOTE — TOC Transition Note (Signed)
Transition of Care (TOC) - CM/SW Discharge Note Donn Pierini RN,BSN Transitions of Care Unit 4NP (Non Trauma)- RN Case Manager See Treatment Team for direct Phone #   Patient Details  Name: Nicholas Potts MRN: [id number] Date of Birth: March 18, 1961  Transition of Care [facility]) CM/SW Contact:  Darrold Span, RN Phone Number: 10/22/2022, 1:17 PM   Clinical Narrative:    CIR liaison has notified that pt has bed available for admit today. Pt stable for transition to INPT rehab.   No further TOC needs noted Pt will transition to [facility] INPT rehab later this afternoon.    Final next level of care: IP Rehab Facility Barriers to Discharge: Barriers Resolved   Patient Goals and CMS Choice CMS Medicare.gov Compare Post Acute Care list provided to:: Patient Choice offered to / list presented to : Patient, Adult Children  Discharge Placement               [facility]          Discharge Plan and Services Additional resources added to the After Visit Summary for       Post Acute Care Choice: IP Rehab                               Social Determinants of Health (SDOH) Interventions SDOH Screenings   Depression (PHQ2-9): Low Risk  (04/07/2022)  Tobacco Use: Medium Risk (10/16/2022)     Readmission Risk Interventions    10/22/2022    1:17 PM  Readmission Risk Prevention Plan  Medication Screening Complete  Transportation Screening Complete
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
## 2022-10-22 NOTE — Discharge Instructions (Addendum)
Inpatient Rehab Discharge Instructions  Jonie Kettinger Discharge date and time: 10/31/2022  Activities/Precautions/ Functional Status: Activity: no lifting, driving, or strenuous exercise until cleared by MD. TLSO (back brace) when out of bed. Diet: regular diet Wound Care: keep wound clean and dry Functional status:  ___ No restrictions     ___ Walk up steps independently ___ 24/7 supervision/assistance   ___ Walk up steps with assistance __x_ Intermittent supervision/assistance  ___ Bathe/dress independently ___ Walk with walker     ___ Bathe/dress with assistance ___ Walk Independently    ___ Shower independently ___ Walk with assistance    _x__ Shower with assistance __x_ No alcohol     ___ Return to work/school ________  Special Instructions: No driving, alcohol consumption or tobacco use.  COMMUNITY REFERRALS UPON DISCHARGE:    Outpatient: PT             Agency: [facility] location  Phone: [phone]             Appointment Date/Time: *Please expect follow-up within 7-10 business days to schedule your appointment. If you have not received follow-up, be sure to contact the site directly.*  Medical Equipment/Items Ordered:rolling walker and tub transfer bench                                                 Agency/Supplier:[facility] [phone]   My questions have been answered and I understand these instructions. I will adhere to these goals and the provided educational materials after my discharge from the hospital.  Patient/Caregiver Signature _______________________________ Date __________  Clinician Signature _______________________________________ Date __________  Please bring this form and your medication list with you to all your follow-up doctor's appointments.

## 2022-10-22 NOTE — Progress Notes (Signed)
IP rehab admissions - I spoke with Dr. Maisie Fus and patient has been cleared for admission to inpatient rehab today.  I do have a bed on CIR for patient today.  Will admit today.  Call me for questions.  [phone]
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
## 2022-10-22 NOTE — Progress Notes (Signed)
Patient arrived to unit and oriented to unit routines. Family member at bedside. Verbalized willingness to call for assistance

## 2022-10-22 NOTE — Progress Notes (Signed)
Patient has his home CPAP machine and is familiar with its use. His daughter is at bedside and will help him to use. They were informed that RT will be available to assist them if necessary.

## 2022-10-23 ENCOUNTER — Inpatient Hospital Stay (HOSPITAL_COMMUNITY): Payer: BC Managed Care – PPO

## 2022-10-23 DIAGNOSIS — S22078A Other fracture of T9-T10 vertebra, initial encounter for closed fracture: Secondary | ICD-10-CM | POA: Diagnosis not present

## 2022-10-23 LAB — COMPREHENSIVE METABOLIC PANEL
ALT: 33 U/L (ref 0–44)
AST: 23 U/L (ref 15–41)
Albumin: 3 g/dL — ABNORMAL LOW (ref 3.5–5.0)
Alkaline Phosphatase: 66 U/L (ref 38–126)
Anion gap: 8 (ref 5–15)
BUN: 14 mg/dL (ref 8–23)
CO2: 33 mmol/L — ABNORMAL HIGH (ref 22–32)
Calcium: 8.5 mg/dL — ABNORMAL LOW (ref 8.9–10.3)
Chloride: 97 mmol/L — ABNORMAL LOW (ref 98–111)
Creatinine, Ser: 0.99 mg/dL (ref 0.61–1.24)
GFR, Estimated: >60 mL/min (ref >60)
Glucose, Bld: 125 mg/dL — ABNORMAL HIGH (ref 70–99)
Potassium: 4.1 mmol/L (ref 3.5–5.1)
Sodium: 138 mmol/L (ref 135–145)
Total Bilirubin: 0.6 mg/dL (ref 0.3–1.2)
Total Protein: 6.4 g/dL — ABNORMAL LOW (ref 6.5–8.1)

## 2022-10-23 LAB — CBC WITH DIFFERENTIAL/PLATELET
Abs Immature Granulocytes: 0.08 10*3/uL — ABNORMAL HIGH (ref 0.00–0.07)
Basophils Absolute: 0.1 10*3/uL (ref 0.0–0.1)
Basophils Relative: 1 %
Eosinophils Absolute: 0.3 10*3/uL (ref 0.0–0.5)
Eosinophils Relative: 3 %
HCT: 43.1 % (ref 39.0–52.0)
Hemoglobin: 13.5 g/dL (ref 13.0–17.0)
Immature Granulocytes: 1 %
Lymphocytes Relative: 20 %
Lymphs Abs: 1.8 10*3/uL (ref 0.7–4.0)
MCH: 31.8 pg (ref 26.0–34.0)
MCHC: 31.3 g/dL (ref 30.0–36.0)
MCV: 101.4 fL — ABNORMAL HIGH (ref 80.0–100.0)
Monocytes Absolute: 1 10*3/uL (ref 0.1–1.0)
Monocytes Relative: 11 %
Neutro Abs: 5.7 10*3/uL (ref 1.7–7.7)
Neutrophils Relative %: 64 %
Platelets: 285 10*3/uL (ref 150–400)
RBC: 4.25 MIL/uL (ref 4.22–5.81)
RDW: 12.4 % (ref 11.5–15.5)
WBC: 8.9 10*3/uL (ref 4.0–10.5)
nRBC: 0 % (ref 0.0–0.2)

## 2022-10-23 LAB — GLUCOSE, CAPILLARY
Glucose-Capillary: 109 mg/dL — ABNORMAL HIGH (ref 70–99)
Glucose-Capillary: 122 mg/dL — ABNORMAL HIGH (ref 70–99)
Glucose-Capillary: 150 mg/dL — ABNORMAL HIGH (ref 70–99)

## 2022-10-23 MED ORDER — MAGNESIUM CITRATE PO SOLN
1.0000 | Freq: Once | ORAL | Status: DC
  Filled 2022-10-23: qty 296

## 2022-10-23 MED ORDER — TRAMADOL HCL 50 MG PO TABS
100.0000 mg | ORAL_TABLET | Freq: Four times a day (QID) | ORAL | Status: DC
  Administered 2022-10-23 – 2022-10-31 (×32): 100 mg via ORAL
  Filled 2022-10-23 (×32): qty 2

## 2022-10-23 NOTE — Progress Notes (Signed)
Patients daughters at bedside helping translate. Patient speaks spanish, however is able to communicate in some [place]. Patient had 2 very large BM after Citrate of Mag. Encourage fluids. Call light within reach.
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
## 2022-10-23 NOTE — Progress Notes (Signed)
Inpatient Rehabilitation  Patient information reviewed and entered into eRehab system by Tylin Force Giordano Getman, OTR/L, Rehab Quality Coordinator.   Information including medical coding, functional ability and quality indicators will be reviewed and updated through discharge.   

## 2022-10-23 NOTE — Progress Notes (Signed)
Physical Therapy Session Note  Patient Details  Name: Nicholas Potts MRN: [id number] Date of Birth: 06/17/1960  Today's Date: 10/23/2022 PT Individual Time: 1430-1503 PT Individual Time Calculation (min): 33 min   Short Term Goals: Week 1:  PT Short Term Goal 1 (Week 1): =LTGs d/t ELOS  Skilled Therapeutic Interventions/Progress Updates: Pt presented sitting EOB agreeable to therapy, daughter and interpreter present throughout session. Pt c/o pain 8/10 mid back. Pt with TLSO on, appeared to be high on chest. Per pt feels comfortable, pt performed Sit to stand with CGA and cues for hand placement and PTA slightly lowered TLSO to allow front support to be at chest vs chin/neck with pt tolerating well. Session focused on general mobility to allow for increased endurance. Pt ambulated to main gym with x 2 standing rest breaks and x 1 seated rest break. SpO2 checked during seated rest break noted at 96% on 1L supplemental O2. In gym pt sat at high/low mat and participated in light activities including ball taps with 1lb dowel x 10 and catch with beach ball x 10 with pt required break after five of each activity due to pain. Pt ambulated back to room requiring cues for hand placement when performing stand. Pt returned to room with x 1 seated rest break. Upon return to room pt requesting to remain seated EOB due to position with least amount of pain. Pt left seated EOB with 2 daughters present, bed alarm on, and needs met.    Therapy Documentation Precautions:  Precautions Precautions: Fall, Back Precaution Comments: discussed verbally Required Braces or Orthoses: Spinal Brace Spinal Brace: Thoracolumbosacral orthotic, Applied in sitting position Restrictions Weight Bearing Restrictions: No General:   Vital Signs: Therapy Vitals Temp: 98.5 F (36.9 C) Temp Source: Oral Pulse Rate: 79 Resp: 18 BP: (!) 107/51 Patient Position (if appropriate): Lying Oxygen Therapy SpO2: 94 % O2 Device:  Room Air Pain: Pain Assessment Pain Scale: 0-10 Pain Score: 8  Pain Type: Surgical pain Pain Location: Back Pain Orientation: Mid Pain Descriptors / Indicators: Aching Pain Onset: On-going Patients Stated Pain Goal: 2 Pain Intervention(s): Pain med given for lower pain score than stated, per patient request;Hot/Cold interventions;Rest;Distraction;Repositioned;Relaxation Multiple Pain Sites: No    Therapy/Group: Individual Therapy  Ziggy Reveles 10/23/2022, 4:30 PM

## 2022-10-23 NOTE — Progress Notes (Signed)
PROGRESS NOTE   Subjective/Complaints:  Pt reports pain a little better with tramadol- 8/10 not 8.5/10.   Hasn't taken norco since yesterday afternoon. Around 5pm. Has been taking tramadol 50 mg q6 hours.   LBM small BM yesterday, but still feels very distended.   KUB done this AM shows moderate to large stool burden, but no obstruction or ileus.      ROS:  Pt denies SOB, abd pain, CP, N/V/ (+)C/D, and vision changes  Except for HPI  Objective:   DG Abd 1 View  Result Date: 10/23/2022 CLINICAL DATA:  [id number] Constipation by delayed colonic transit [id number] EXAM: ABDOMEN - 1 VIEW COMPARISON:  None Available. FINDINGS: There is no evidence of bowel obstruction. There is moderate to large diffuse colonic stool burden most prominent in the ascending colon. No radiopaque calculi overlie the kidneys. Partially visualized lower thoracic fusion hardware. Degenerative changes of the spine. IMPRESSION: Moderate to large colonic stool burden. Nonobstructive bowel gas pattern. Electronically Signed   By: Caprice Renshaw M.D.   On: 10/23/2022 08:59   Recent Labs    10/23/22 0700  WBC 8.9  HGB 13.5  HCT 43.1  PLT 285   Recent Labs    10/23/22 0700  NA 138  K 4.1  CL 97*  CO2 33*  GLUCOSE 125*  BUN 14  CREATININE 0.99  CALCIUM 8.5*    Intake/Output Summary (Last 24 hours) at 10/23/2022 8119 Last data filed at 10/23/2022 0857 Gross per 24 hour  Intake 580 ml  Output 700 ml  Net -120 ml        Physical Exam: Vital Signs Blood pressure 115/60, pulse 85, temperature 97.8 F (36.6 C), temperature source Oral, resp. rate 16, height 5\' 7"  (1.702 m), weight 120.3 kg, SpO2 92 %.    General: awake, alert, appropriate, sitting EOB; wearing 3L O2- daughter [name] at bedside; NAD HENT: conjugate gaze; oropharynx dry- wearing O2 by [place] CV: regular rate and rhythm;  no JVD Pulmonary: CTA B/L; no W/R/R- decreased air movement  throughout GI: firm- pretty firm as well as distended; NT; normoactive BS Psychiatric: appropriate- bright affect Neurological: Ox3 Musculoskeletal:     Cervical back: Neck supple. No tenderness.     Right lower leg: No edema.     Left lower leg: No edema.     Comments: 5-/5 throughout- limited by pain mainly  Skin:    General: Skin is warm and dry.     Comments: Incision well approximated and healing without signs of infection Looks great IV L hand IV - looks OK  Neurological:     General: No focal deficit present.     Mental Status: He is alert and oriented to person, place, and time.     Comments: Intact to light touch in all 4 extremities No increased tone No Hoffman's or clonus B/L   Assessment/Plan: 1. Functional deficits which require 3+ hours per day of interdisciplinary therapy in a comprehensive inpatient rehab setting. Physiatrist is providing close team supervision and 24 hour management of active medical problems listed below. Physiatrist and rehab team continue to assess barriers to discharge/monitor patient progress toward functional and medical goals  Care  Tool:  Bathing              Bathing assist       Upper Body Dressing/Undressing Upper body dressing        Upper body assist      Lower Body Dressing/Undressing Lower body dressing            Lower body assist       Toileting Toileting    Toileting assist       Transfers Chair/bed transfer  Transfers assist           Locomotion Ambulation   Ambulation assist              Walk 10 feet activity   Assist           Walk 50 feet activity   Assist           Walk 150 feet activity   Assist           Walk 10 feet on uneven surface  activity   Assist           Wheelchair     Assist               Wheelchair 50 feet with 2 turns activity    Assist            Wheelchair 150 feet activity     Assist           Blood pressure 115/60, pulse 85, temperature 97.8 F (36.6 C), temperature source Oral, resp. rate 16, height 5\' 7"  (1.702 m), weight 120.3 kg, SpO2 92 %.  Medical Problem List and Plan: 1. Functional deficits secondary to thoracic fractures s/p open reduction and instrumentation form T9-T12- not SCI patient luckily.              -patient may  shower -cover incision and take brace off for showering, per surgeon             -ELOS/Goals: 10-12 days supervision   First day of evaluations- con't CIR PT and OT 2.  Antithrombotics: -DVT/anticoagulation:  Pharmaceutical: Lovenox             -antiplatelet therapy:    3. Pain Management: Tylenol, Norco, Robaxin as needed- will add Tramadol 50 mg q6 hours- can increase if necessary tomorrow.    5/17- not taking Norco much due to sedation- will increase Tramadol to 100 mg q6 hours due to pain control and less constipation 4. Mood/Behavior/Sleep: LCSW to evaluate and provide emotional support             -antipsychotic agents: n/a   5. Neuropsych/cognition: This patient is capable of making decisions on his own behalf.   6. Skin/Wound Care: Routine skin care checks             -monitor surgical incision   5/17- incision looks great-  7. Fluids/Electrolytes/Nutrition: Routine Is and Os and follow-up chemistries   8: Open reduction of T10-11 fracture and posteriolateral arthrodesis of T9-03-19-11             -follow-up with Dr. Conchita Paris             -TLSO when out of bed   9: Hyperlipidemia: continue statin   10: OSA: BIPAP titration sleep study 12/14/21 required BIPAP 18/11 with residual minimum O2 sat 87% and mean 91.1%              -has home CPAP machine             -  O2 supplementation via [place] to keep SaO2 >88%   5/17- CO2 33- starting to retain CO2- will reduce O2 to 2L max 11: Constipation-will give Sorbitol 60cc and if that doesn't work (refuses enema) then will give Mg citrate tomorrow after therapy.    5/17- no results with sorbitol-  will order Mg citrate 1/2 bottle after therapy and 1/2 bottle at 8pm if no BM     I spent a total of 51   minutes on total care today- >50% coordination of care- due to  D/w nursing x2 about Co2 levels, pain meds and bowel meds for constipation- already independently reviewed KUB- agree with read.    LOS: 1 days A FACE TO FACE EVALUATION WAS PERFORMED  Rambo Sarafian 10/23/2022, 9:07 AM

## 2022-10-23 NOTE — Progress Notes (Signed)
Inpatient Rehabilitation Care Coordinator Assessment and Plan Patient Details  Name: Nicholas Potts MRN: [id number] Date of Birth: May 30, 1961  Today's Date: 10/23/2022  Hospital Problems: Principal Problem:   Dorsal (thoracic) vertebral fracture [facility])  Past Medical History:  Past Medical History:  Diagnosis Date   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HCC) 2014   post op    ARF (acute respiratory failure) (HCC) 2014   Blood dyscrasia    Chronic kidney disease 2014   ARF    Complication of anesthesia 2014   - post op - in combination of pain pills-  Cardiac and Respiratory Arrest- prior to sleep apnea diagonosis   Constipation    CVA (cerebral infarction)    Hyperlipidemia    Hypertension    OSA (obstructive sleep apnea)    Pre-diabetes    Short-term memory loss    Sleep apnea    Stress-induced cardiomyopathy -- RESOLVED    Post-op Narcotic induced Acute Hypoxic respiratory failure with respiratory and PEA cardiac arrest following accidental narcotic overdose; initial [place] 20-20%, now improved to 55%   Was supported with Impella Cardiac Cath - non-obstructive CAD ; 1 month after event - EF up from 25% to 55-60% - stable x 5 yrs   Thrombocytopenia (HCC) 2014   Past Surgical History:  Past Surgical History:  Procedure Laterality Date   ACHILLES TENDON REPAIR     BIOPSY  08/01/2019   Procedure: BIOPSY;  Surgeon: Napoleon Form, MD;  Location: [facility];  Service: Endoscopy;;   BIOPSY  08/05/2020   Procedure: BIOPSY;  Surgeon: Napoleon Form, MD;  Location: [facility];  Service: Endoscopy;;   CARDIAC CATHETERIZATION  12/29/2012   RN LHC --> Impella. for severe shock with low EF. Mild to moderate RV pressure elevation. Normal coronary arteries. Cardiac Output 2.5, Index 1.46.   COLONOSCOPY WITH PROPOFOL N/A 10/30/2015   Procedure: COLONOSCOPY WITH PROPOFOL;  Surgeon: Napoleon Form, MD;  Location: [facility];  Service: Endoscopy;  Laterality: N/A;   COLONOSCOPY  WITH PROPOFOL N/A 08/01/2019   Procedure: COLONOSCOPY WITH PROPOFOL;  Surgeon: Napoleon Form, MD;  Location: [facility];  Service: Endoscopy;  Laterality: N/A;   COLONOSCOPY WITH PROPOFOL N/A 08/05/2020   Procedure: COLONOSCOPY WITH PROPOFOL;  Surgeon: Napoleon Form, MD;  Location: [facility];  Service: Endoscopy;  Laterality: N/A;   INSERTION OF DIALYSIS CATHETER Right 01/09/2013   Procedure: INSERTION OF DIALYSIS CATHETER;  Surgeon: Chuck Hint, MD;  [facility] OR;  Service: Vascular;  Laterality: Right;   LAMINECTOMY WITH POSTERIOR LATERAL ARTHRODESIS LEVEL 3 N/A 10/15/2022   Procedure: THORACIC NINE-THORACIC TWELVE POSTERIOR INSTRUMENTED FUSION REDUCTION OF FRACTURE;  Surgeon: Bedelia Person, MD;  Location[facility] OR;  Service: Neurosurgery;  Laterality: N/A;   LEFT AND RIGHT HEART CATHETERIZATION WITH CORONARY ANGIOGRAM  12/29/2012   Procedure: LEFT AND RIGHT HEART CATHETERIZATION WITH CORONARY ANGIOGRAM;  Surgeon: Tonny Bollman, MD;  Location: [facility] CATH LAB;  Service: Cardiovascular;;   POLYPECTOMY  08/01/2019   Procedure: POLYPECTOMY;  Surgeon: Napoleon Form, MD;  Location: [facility];  Service: Endoscopy;;   POLYPECTOMY  08/05/2020   Procedure: POLYPECTOMY;  Surgeon: Napoleon Form, MD;  Location: [facility];  Service: Endoscopy;;   TRANSTHORACIC ECHOCARDIOGRAM  12/29/2012   EF 25% with periapical HK/AKA.  --> Followup echo 7/25 -- EF of 35%   TRANSTHORACIC ECHOCARDIOGRAM  01/2013 ; 07/2015   a) EF 55-60%. No regional WMA. Grade 1 diastolic dysfunction. Mild LA dilatation.; b) Normal LV size with mild LV  hypertrophy. EF 55-60%. Normal RV   TRANSTHORACIC ECHOCARDIOGRAM  03/2018   EF 55-60%.  NO RWMA. Normal valves   Social History:  reports that he quit smoking about 36 years ago. His smoking use included cigarettes. He has never used smokeless tobacco. He reports current alcohol use. He reports that he does not use drugs.  Family / Support  Systems Marital Status: Widow/Widower How Long?: 21 years Patient Roles: Parent Spouse/Significant Other: Widow Children: 3 children- [name], [name] (lives in the home), and Delos Beyler (lives in [place]) Other Supports: none reported Anticipated Caregiver: Pt dtrs Ability/Limitations of Caregiver: Pt dtr [name] works during the day; Dtr [name] reports she has a flexible work schedule. Caregiver Availability: 24/7 Family Dynamics: Pt lives with his dtr [name].  Social History Preferred language: Spanish Religion: Jehovah's Witness Cultural Background: Pt was working prior to medical issues. 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 Forms on file.   Abuse/Neglect Abuse/Neglect Assessment Can Be Completed: Yes Physical Abuse: Denies Verbal Abuse: Denies Sexual Abuse: Denies Exploitation of patient/patient's resources: Denies Self-Neglect: Denies  Patient response to: Social Isolation - How often do you feel lonely or isolated from those around you?: Never  Emotional Status Pt's affect, behavior and adjustment status: Pt tired during session, and fell asleep several times. Recent Psychosocial Issues: Denies Psychiatric History: Denies Substance Abuse History: Pt admits he quit smoking cigarettes 30 yrs ago. Admits to 1 beer after work 2-3xs per week  Patient / Family Perceptions, Expectations & Goals Pt/Family understanding of illness & functional limitations: Pt dtr has general understanding of care needs Premorbid pt/family roles/activities: Independent; pt dtr reports some short term memory loss from a previous hospitlaization in which he had to be resuscitated several times. States she was  helping with bills, and set most to autopay so he would not forget to pay. Anticipated changes in roles/activities/participation: Assistance with ADLs/IADLs Pt/family expectations/goals: Pt unsure on goal, states he would like to be more mobille and independent. Pt dtr states she would like her father to be more independent which would be good, and not have to deal with so much back pain.  Community Resources [name]: None Premorbid Home Care/DME Agencies: None Transportation available at discharge: TBD Is the patient able to respond to transportation needs?: Yes In the past 12 months, has lack of transportation kept you from medical appointments or from getting medications?: No In the past 12 months, has lack of transportation kept you from meetings, work, or from getting things needed for daily living?: No Resource referrals recommended: Neuropsychology  Discharge Planning Living Arrangements: Children Support Systems: Children Type of Residence: Private residence Insurance Resources: Media planner (specify) Herbalist) Financial Resources: Employment Surveyor, quantity Screen Referred: No Living Expenses: Lives with family Money Management: Family Does the patient have any problems obtaining your medications?: No Home Management: Pt reports he does yard work, and cleans his room and bathroom. Patient/Family Preliminary Plans: TBD Care Coordinator Barriers to Discharge: Decreased caregiver support, Lack of/limited family support, Insurance for SNF coverage, New oxygen Care Coordinator Barriers to Discharge Comments: Will determine if oxygen required at discharge. Care Coordinator Anticipated Follow Up Needs: HH/OP  Clinical Impression SW met with pt and pt dtr Maria-Louisa to introduce self, explain role, and discuss discharge process. Pt is not  a veteran. HCPOA- [name] (dtr). Friend listed on HCPOA is now deceased. Plans to update later. Pt active Jehovah's Witness. DME: cane,  and has access to a RW from a friend; CPAP machine at home.   Jurnee Nakayama A Tymara Saur 10/23/2022, 1:52 PM
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
## 2022-10-23 NOTE — Progress Notes (Signed)
Patient ID: Nicholas Potts, male   DOB: [birth year], 62 y.o.   MRN: [id number]  SW left message for pt dtr [name] to introduce self, explain role, and discuss discharge process. SW informed will followup with updates as available.   Cecile Sheerer, MSW, LCSWA Office: [phone] Cell: [phone] Fax: [phone]

## 2022-10-23 NOTE — Plan of Care (Signed)
  Problem: RH Balance Goal: LTG: Patient will maintain dynamic sitting balance (OT) Description: LTG:  Patient will maintain dynamic sitting balance with assistance during activities of daily living (OT) Flowsheets (Taken 10/23/2022 1249) LTG: Pt will maintain dynamic sitting balance during ADLs with: Independent with assistive device Goal: LTG Patient will maintain dynamic standing with ADLs (OT) Description: LTG:  Patient will maintain dynamic standing balance with assist during activities of daily living (OT)  Flowsheets (Taken 10/23/2022 1249) LTG: Pt will maintain dynamic standing balance during ADLs with: Independent with assistive device   Problem: Sit to Stand Goal: LTG:  Patient will perform sit to stand in prep for activites of daily living with assistance level (OT) Description: LTG:  Patient will perform sit to stand in prep for activites of daily living with assistance level (OT) Flowsheets (Taken 10/23/2022 1249) LTG: PT will perform sit to stand in prep for activites of daily living with assistance level: Independent with assistive device   Problem: RH Grooming Goal: LTG Patient will perform grooming w/assist,cues/equip (OT) Description: LTG: Patient will perform grooming with assist, with/without cues using equipment (OT) Flowsheets (Taken 10/23/2022 1249) LTG: Pt will perform grooming with assistance level of: Independent with assistive device    Problem: RH Bathing Goal: LTG Patient will bathe all body parts with assist levels (OT) Description: LTG: Patient will bathe all body parts with assist levels (OT) Flowsheets (Taken 10/23/2022 1249) LTG: Pt will perform bathing with assistance level/cueing: Independent with assistive device    Problem: RH Dressing Goal: LTG Patient will perform upper body dressing (OT) Description: LTG Patient will perform upper body dressing with assist, with/without cues (OT). Flowsheets (Taken 10/23/2022 1249) LTG: Pt will perform upper body  dressing with assistance level of: Independent with assistive device Goal: LTG Patient will perform lower body dressing w/assist (OT) Description: LTG: Patient will perform lower body dressing with assist, with/without cues in positioning using equipment (OT) Flowsheets (Taken 10/23/2022 1249) LTG: Pt will perform lower body dressing with assistance level of: Independent with assistive device   Problem: RH Toileting Goal: LTG Patient will perform toileting task (3/3 steps) with assistance level (OT) Description: LTG: Patient will perform toileting task (3/3 steps) with assistance level (OT)  Flowsheets (Taken 10/23/2022 1249) LTG: Pt will perform toileting task (3/3 steps) with assistance level: Independent with assistive device   Problem: RH Toilet Transfers Goal: LTG Patient will perform toilet transfers w/assist (OT) Description: LTG: Patient will perform toilet transfers with assist, with/without cues using equipment (OT) Flowsheets (Taken 10/23/2022 1249) LTG: Pt will perform toilet transfers with assistance level of: Independent with assistive device   Problem: RH Tub/Shower Transfers Goal: LTG Patient will perform tub/shower transfers w/assist (OT) Description: LTG: Patient will perform tub/shower transfers with assist, with/without cues using equipment (OT) Flowsheets (Taken 10/23/2022 1249) LTG: Pt will perform tub/shower stall transfers with assistance level of: Independent with assistive device   Problem: RH Furniture Transfers Goal: LTG Patient will perform furniture transfers w/assist (OT/PT) Description: LTG: Patient will perform furniture transfers  with assistance (OT/PT). Flowsheets (Taken 10/23/2022 1249) LTG: Pt will perform furniture transfers with assist:: Independent with assistive device

## 2022-10-24 DIAGNOSIS — S22079A Unspecified fracture of T9-T10 vertebra, initial encounter for closed fracture: Secondary | ICD-10-CM | POA: Diagnosis not present

## 2022-10-24 LAB — GLUCOSE, CAPILLARY
Glucose-Capillary: 106 mg/dL — ABNORMAL HIGH (ref 70–99)
Glucose-Capillary: 153 mg/dL — ABNORMAL HIGH (ref 70–99)
Glucose-Capillary: 120 mg/dL — ABNORMAL HIGH (ref 70–99)
Glucose-Capillary: 177 mg/dL — ABNORMAL HIGH (ref 70–99)

## 2022-10-24 MED ORDER — MODAFINIL 100 MG PO TABS
200.0000 mg | ORAL_TABLET | Freq: Every day | ORAL | Status: DC
  Administered 2022-10-24 – 2022-10-31 (×8): 200 mg via ORAL
  Filled 2022-10-24 (×8): qty 2

## 2022-10-24 NOTE — Progress Notes (Signed)
PROGRESS NOTE   Subjective/Complaints:   Pt reports took Provigil at home 200 mg daily for lethargy  Also likes tramadol increase- pain as little as 6/10 when on side; but usually 7-12/10/08 with increas ein tramadol- yesterday was 8/10- Had 2 large BM's yesterday.   ROS:  Pt denies SOB, abd pain, CP, N/V/C/D, and vision changes   Except for HPI  Objective:   DG Abd 1 View  Result Date: 10/23/2022 CLINICAL DATA:  [id number] Constipation by delayed colonic transit [id number] EXAM: ABDOMEN - 1 VIEW COMPARISON:  None Available. FINDINGS: There is no evidence of bowel obstruction. There is moderate to large diffuse colonic stool burden most prominent in the ascending colon. No radiopaque calculi overlie the kidneys. Partially visualized lower thoracic fusion hardware. Degenerative changes of the spine. IMPRESSION: Moderate to large colonic stool burden. Nonobstructive bowel gas pattern. Electronically Signed   By: Caprice Renshaw M.D.   On: 10/23/2022 08:59   Recent Labs    10/23/22 0700  WBC 8.9  HGB 13.5  HCT 43.1  PLT 285   Recent Labs    10/23/22 0700  NA 138  K 4.1  CL 97*  CO2 33*  GLUCOSE 125*  BUN 14  CREATININE 0.99  CALCIUM 8.5*    Intake/Output Summary (Last 24 hours) at 10/24/2022 1041 Last data filed at 10/24/2022 0953 Gross per 24 hour  Intake 480 ml  Output 750 ml  Net -270 ml        Physical Exam: Vital Signs Blood pressure (!) 115/58, pulse 79, temperature 97.8 F (36.6 C), temperature source Oral, resp. rate 18, height 5\' 7"  (1.702 m), weight 120.3 kg, SpO2 94 %.     General: awake, alert, appropriate, sitting EOB; daughter [name] in room; NAD HENT: conjugate gaze; oropharynx moist CV: regular rate and rhythm ; no JVD Pulmonary: CTA B/L; no W/R/R- good air movement- O2 1L in  GI: soft, NT, ND, (+)BS- more normoactive and less distended Psychiatric: appropriate- brighter affect Neurological: Ox3-  more awake  Musculoskeletal:     Cervical back: Neck supple. No tenderness.     Right lower leg: No edema.     Left lower leg: No edema.     Comments: 5-/5 throughout- limited by pain mainly  Skin:    General: Skin is warm and dry.     Comments: Incision well approximated and healing without signs of infection Looks great IV L hand IV - looks OK  Neurological:     General: No focal deficit present.     Mental Status: He is alert and oriented to person, place, and time.     Comments: Intact to light touch in all 4 extremities No increased tone No Hoffman's or clonus B/L   Assessment/Plan: 1. Functional deficits which require 3+ hours per day of interdisciplinary therapy in a comprehensive inpatient rehab setting. Physiatrist is providing close team supervision and 24 hour management of active medical problems listed below. Physiatrist and rehab team continue to assess barriers to discharge/monitor patient progress toward functional and medical goals  Care Tool:  Bathing    Body parts bathed by patient: Right arm, Left arm, Chest, Abdomen, Front perineal area,  Right upper leg, Left upper leg, Face   Body parts bathed by helper: Buttocks, Right lower leg, Left lower leg     Bathing assist Assist Level: Moderate Assistance - Patient 50 - 74%     Upper Body Dressing/Undressing Upper body dressing   What is the patient wearing?: Pull over shirt, Orthosis    Upper body assist Assist Level: Moderate Assistance - Patient 50 - 74%    Lower Body Dressing/Undressing Lower body dressing      What is the patient wearing?: Pants     Lower body assist Assist for lower body dressing: Maximal Assistance - Patient 25 - 49%     Toileting Toileting    Toileting assist Assist for toileting: Maximal Assistance - Patient 25 - 49%     Transfers Chair/bed transfer  Transfers assist     Chair/bed transfer assist level: Contact Guard/Touching assist      Locomotion Ambulation   Ambulation assist      Assist level: Contact Guard/Touching assist Assistive device: Walker-rolling Max distance: 40 ft   Walk 10 feet activity   Assist     Assist level: Contact Guard/Touching assist Assistive device: Walker-rolling   Walk 50 feet activity   Assist Walk 50 feet with 2 turns activity did not occur: Safety/medical concerns         Walk 150 feet activity   Assist Walk 150 feet activity did not occur: Safety/medical concerns         Walk 10 feet on uneven surface  activity   Assist     Assist level: Contact Guard/Touching assist Assistive device: Walker-rolling   Wheelchair     Assist Is the patient using a wheelchair?: Yes (transport only for time and energy conservation) Type of Wheelchair: Manual    Wheelchair assist level: Dependent - Patient 0%      Wheelchair 50 feet with 2 turns activity    Assist        Assist Level: Dependent - Patient 0%   Wheelchair 150 feet activity     Assist      Assist Level: Dependent - Patient 0%   Blood pressure (!) 115/58, pulse 79, temperature 97.8 F (36.6 C), temperature source Oral, resp. rate 18, height 5\' 7"  (1.702 m), weight 120.3 kg, SpO2 94 %.  Medical Problem List and Plan: 1. Functional deficits secondary to thoracic fractures s/p open reduction and instrumentation form T9-T12- not SCI patient luckily.              -patient may  shower -cover incision and take brace off for showering, per surgeon             -ELOS/Goals: 10-12 days supervision   Con't CIR PT and OT 2.  Antithrombotics: -DVT/anticoagulation:  Pharmaceutical: Lovenox             -antiplatelet therapy:    3. Pain Management: Tylenol, Norco, Robaxin as needed- will add Tramadol 50 mg q6 hours- can increase if necessary tomorrow.    5/17- not taking Norco much due to sedation- will increase Tramadol to 100 mg q6 hours due to pain control and less constipation  5/18- pt  feels there's an improvement in pain with increase in tramadol- but not a lot of difference on Pain scale- he wants ot continue it 4. Mood/Behavior/Sleep: LCSW to evaluate and provide emotional support             -antipsychotic agents: n/a   5. Neuropsych/cognition: This patient is capable of  making decisions on his own behalf.   6. Skin/Wound Care: Routine skin care checks             -monitor surgical incision   5/17- incision looks great-  7. Fluids/Electrolytes/Nutrition: Routine Is and Os and follow-up chemistries   8: Open reduction of T10-11 fracture and posteriolateral arthrodesis of T9-03-19-11             -follow-up with Dr. Conchita Paris             -TLSO when out of bed   9: Hyperlipidemia: continue statin   10: OSA: BIPAP titration sleep study 12/14/21 required BIPAP 18/11 with residual minimum O2 sat 87% and mean 91.1%              -has home CPAP machine             -O2 supplementation via [place] to keep SaO2 >88%   5/17- CO2 33- starting to retain CO2- will reduce O2 to 2L max  5/18- using 1L O2- so CO2 should be better- will recheck Monday- more awake this AM 11: Constipation-will give Sorbitol 60cc and if that doesn't work (refuses enema) then will give Mg citrate tomorrow after therapy.    5/17- no results with sorbitol- will order Mg citrate 1/2 bottle after therapy and 1/2 bottle at 8pm if no BM  5/18- 2 large BM's yesterday after 1/2 bottle Mg citrate 12. Lethargy  5/18- took Provigil at home 200 mg daily- restarted for pt today.      I spent a total of  36  minutes on total care today- >50% coordination of care- due to  D/w t and staff about Provigil as well as BM's last night - also reviewed chart for med changes made yesterday   LOS: 2 days A FACE TO FACE EVALUATION WAS PERFORMED  Nicholas Potts 10/24/2022, 10:41 AM

## 2022-10-25 DIAGNOSIS — S22079A Unspecified fracture of T9-T10 vertebra, initial encounter for closed fracture: Secondary | ICD-10-CM | POA: Diagnosis not present

## 2022-10-25 LAB — GLUCOSE, CAPILLARY
Glucose-Capillary: 123 mg/dL — ABNORMAL HIGH (ref 70–99)
Glucose-Capillary: 87 mg/dL (ref 70–99)
Glucose-Capillary: 141 mg/dL — ABNORMAL HIGH (ref 70–99)
Glucose-Capillary: 120 mg/dL — ABNORMAL HIGH (ref 70–99)

## 2022-10-25 NOTE — Progress Notes (Signed)
PROGRESS NOTE   Subjective/Complaints:   Pt reports pain 8.5/10 this AM- hurting more- scared "cannot do therapy" advised pt to do what he can, but don't overdo.  Advised pt on max dose Tramadol- but when took Norco, had increased resp issues.  LBM x2 yesterday   ROS:   Pt denies SOB, abd pain, CP, N/V/C/D, and vision changes  Except for HPI  Objective:   No results found. Recent Labs    10/23/22 0700  WBC 8.9  HGB 13.5  HCT 43.1  PLT 285   Recent Labs    10/23/22 0700  NA 138  K 4.1  CL 97*  CO2 33*  GLUCOSE 125*  BUN 14  CREATININE 0.99  CALCIUM 8.5*    Intake/Output Summary (Last 24 hours) at 10/25/2022 0835 Last data filed at 10/25/2022 0727 Gross per 24 hour  Intake 916 ml  Output 2350 ml  Net -1434 ml        Physical Exam: Vital Signs Blood pressure 117/78, pulse 76, temperature (!) 97.4 F (36.3 C), temperature source Oral, resp. rate 18, height 5\' 7"  (1.702 m), weight 120.3 kg, SpO2 93 %.      General: awake, alert, appropriate, sitting EOB; both daughters in room; c/o pain; wearing TLSO; NAD HENT: conjugate gaze; oropharynx moist-wearing O2 1L by [place] CV: regular rate and rhythm; no JVD Pulmonary: CTA B/L; no W/R/R- good air movement GI: soft, NT, ND, (+)BS- more normoactive Psychiatric: appropriate- frustrated due to pain Neurological: Ox3- very awake Musculoskeletal:     Cervical back: Neck supple. No tenderness.     Right lower leg: No edema.     Left lower leg: No edema.     Comments: 5-/5 throughout- limited by pain mainly  Skin:    General: Skin is warm and dry.     Comments: Incision well approximated and healing without signs of infection Looks great IV L hand IV - looks OK  Neurological:     General: No focal deficit present.     Mental Status: He is alert and oriented to person, place, and time.     Comments: Intact to light touch in all 4 extremities No increased  tone No Hoffman's or clonus B/L   Assessment/Plan: 1. Functional deficits which require 3+ hours per day of interdisciplinary therapy in a comprehensive inpatient rehab setting. Physiatrist is providing close team supervision and 24 hour management of active medical problems listed below. Physiatrist and rehab team continue to assess barriers to discharge/monitor patient progress toward functional and medical goals  Care Tool:  Bathing    Body parts bathed by patient: Right arm, Left arm, Chest, Abdomen, Front perineal area, Right upper leg, Left upper leg, Face   Body parts bathed by helper: Buttocks, Right lower leg, Left lower leg     Bathing assist Assist Level: Moderate Assistance - Patient 50 - 74%     Upper Body Dressing/Undressing Upper body dressing   What is the patient wearing?: Pull over shirt, Orthosis    Upper body assist Assist Level: Moderate Assistance - Patient 50 - 74%    Lower Body Dressing/Undressing Lower body dressing      What  is the patient wearing?: Pants     Lower body assist Assist for lower body dressing: Maximal Assistance - Patient 25 - 49%     Toileting Toileting    Toileting assist Assist for toileting: Maximal Assistance - Patient 25 - 49%     Transfers Chair/bed transfer  Transfers assist     Chair/bed transfer assist level: Contact Guard/Touching assist     Locomotion Ambulation   Ambulation assist      Assist level: Contact Guard/Touching assist Assistive device: Walker-rolling Max distance: 40 ft   Walk 10 feet activity   Assist     Assist level: Contact Guard/Touching assist Assistive device: Walker-rolling   Walk 50 feet activity   Assist Walk 50 feet with 2 turns activity did not occur: Safety/medical concerns         Walk 150 feet activity   Assist Walk 150 feet activity did not occur: Safety/medical concerns         Walk 10 feet on uneven surface  activity   Assist     Assist  level: Contact Guard/Touching assist Assistive device: Walker-rolling   Wheelchair     Assist Is the patient using a wheelchair?: Yes (transport only for time and energy conservation) Type of Wheelchair: Manual    Wheelchair assist level: Dependent - Patient 0%      Wheelchair 50 feet with 2 turns activity    Assist        Assist Level: Dependent - Patient 0%   Wheelchair 150 feet activity     Assist      Assist Level: Dependent - Patient 0%   Blood pressure 117/78, pulse 76, temperature (!) 97.4 F (36.3 C), temperature source Oral, resp. rate 18, height 5\' 7"  (1.702 m), weight 120.3 kg, SpO2 93 %.  Medical Problem List and Plan: 1. Functional deficits secondary to thoracic fractures s/p open reduction and instrumentation form T9-T12- not SCI patient luckily.              -patient may  shower -cover incision and take brace off for showering, per surgeon             -ELOS/Goals: 10-12 days supervision   Con't CIR PT and OT IPOC today 2.  Antithrombotics: -DVT/anticoagulation:  Pharmaceutical: Lovenox             -antiplatelet therapy:    3. Pain Management: Tylenol, Norco, Robaxin as needed- will add Tramadol 50 mg q6 hours- can increase if necessary tomorrow.    5/17- not taking Norco much due to sedation- will increase Tramadol to 100 mg q6 hours due to pain control and less constipation  5/18- pt feels there's an improvement in pain with increase in tramadol- but not a lot of difference on Pain scale- he wants ot continue it  5/19- pain doing worse this AM- he thinks due to therapy last 2 days- advised to ask for Robaxin as needed/muscle relaxant.  4. Mood/Behavior/Sleep: LCSW to evaluate and provide emotional support             -antipsychotic agents: n/a   5. Neuropsych/cognition: This patient is capable of making decisions on his own behalf.   6. Skin/Wound Care: Routine skin care checks             -monitor surgical incision   5/17- incision looks  great-  7. Fluids/Electrolytes/Nutrition: Routine Is and Os and follow-up chemistries   8: Open reduction of T10-11 fracture and posteriolateral arthrodesis of T9-03-19-11             -  follow-up with Dr. Conchita Paris             -TLSO when out of bed   9: Hyperlipidemia: continue statin   10: OSA: BIPAP titration sleep study 12/14/21 required BIPAP 18/11 with residual minimum O2 sat 87% and mean 91.1%              -has home CPAP machine             -O2 supplementation via [place] to keep SaO2 >88%   5/17- CO2 33- starting to retain CO2- will reduce O2 to 2L max  5/18- using 1L O2- so CO2 should be better- will recheck Monday- more awake this AM 11: Constipation-will give Sorbitol 60cc and if that doesn't work (refuses enema) then will give Mg citrate tomorrow after therapy.    5/17- no results with sorbitol- will order Mg citrate 1/2 bottle after therapy and 1/2 bottle at 8pm if no BM  5/18- 2 large BM's yesterday after 1/2 bottle Mg citrate  5/19- LBM x2 yesterday 12. Lethargy  5/18- took Provigil at home 200 mg daily- restarted for pt today.    5/19- doing better with Provigil in place   I spent a total of  39  minutes on total care today- >50% coordination of care- due to  Due to IPOC and education on muscle relaxants- as well as discussed options for pain  LOS: 3 days A FACE TO FACE EVALUATION WAS PERFORMED  Janequa Kipnis 10/25/2022, 8:35 AM

## 2022-10-25 NOTE — Progress Notes (Signed)
Occupational Therapy Session Note  Patient Details  Name: Nicholas Potts MRN: [id number] Date of Birth: 02-Nov-1960  Today's Date: 10/25/2022 OT Individual Time: 3086-5784 [id number] OT Individual Time Calculation (min): 116 min    Short Term Goals: Week 1:  OT Short Term Goal 1 (Week 1): STG=LTG's d/t LOS  Skilled Therapeutic Interventions/Progress Updates:  Visit 1: Patient agreeable to OT treatment with current pain level. Patient motivated to take a shower with incision covered and assist as needed to maintain precautions. Patient able to recall 3/3 precautions. Clarified order as it relates to self care without the brace. Patient and his daughter educated regarding removal of TLSO  and donning it following shower. See ADL grid for full self care levels.   Visit 2: Patient seen for education on adaptive equipment and functional transfer training. Patient able to ambulate to the bathroom for CGA toileting tasks and set up with grooming following. Followed with AE dressing training using reacher and sock aide. Patient with good return demo of reacher and sock aide use. Daughter reports she can work to English as a second language teacher prior to discharge. Followed with tub bench transfer training. Patient able to return demo transfer into tub with extended tub bench with assist to lift the right LE over the tub side. Patient reports feeling good about using the tub bench and making it work in his home. Discussed his need for a BSC for safe toilet transfers from a raised surface with arms. Patient and daughter very motivated to work towards an independent and safe discharge with recommended adaptations and DME as needed. Continue to educate on safety following spinal precautions.      Therapy Documentation Precautions:  Precautions Precautions: Fall, Back Precaution Comments: discussed verbally Required Braces or Orthoses: Spinal Brace Spinal Brace: Thoracolumbosacral orthotic, Applied in sitting  position Restrictions Weight Bearing Restrictions: No General:   Vital Signs:  Pain: Pain Assessment Pain Scale: 0-10 Pain Score: 7.5  Pain Location: Back Pain Orientation: Mid Pain Descriptors / Indicators: Aching;Operative site guarding Patients Stated Pain Goal: 2 Pain Intervention(s): Medication (See eMAR);Pain med given for lower pain score than stated, per patient request;Repositioned;Massage Multiple Pain Sites: No ADL: ADL Grooming: Setup Where Assessed-Grooming: Sitting at sink Upper Body Bathing: set up assistance Where Assessed-Upper Body Bathing: Sitting in shower Lower Body Bathing: Maximal assistance Where Assessed-Lower Body Bathing: Sitting in shower Upper Body Dressing: Set up with shirt, Min assist with TLSO Where Assessed-Upper Body Dressing: Sitting EOB Lower Body Dressing: Moderate assistance without AE Where Assessed-Lower Body Dressing: Sitting EOB Toileting: Minimal assistance Where Assessed-Toileting: Teacher, adult education: Patent attorney Method: Surveyor, minerals: Engineer, manufacturing systems Transfer: Insurance underwriter Method: Warden/ranger: Emergency planning/management officer      Therapy/Group: Individual Therapy  Warnell Forester 10/25/2022, 12:26 PM

## 2022-10-25 NOTE — Progress Notes (Signed)
Physical Therapy Session Note  Patient Details  Name: Nicholas Potts MRN: [id number] Date of Birth: 11/07/1960  Today's Date: 10/25/2022 PT Individual Time: 1300-1411 PT Individual Time Calculation (min): 71 min   Short Term Goals: Week 1:  PT Short Term Goal 1 (Week 1): =LTGs d/t ELOS  Skilled Therapeutic Interventions/Progress Updates:  Pt was seen bedside in the pm with family at bedside. Pt performed all sit t stand transfers with S and rolling walker. Pt performed stand pivot transfers with rolling walker and S to c/g. Pt ambulated distances 35 feet x 3, 40 feet and 75 feet with rolling walker and c/g. Pt performed cone taps in gym, 3 sets x 10 reps each for LE strengthening. Pt is improving, discussed with family about requesting pain medicine prior to therapy to help increase tolerance during session. Pt's sessions limited by aerobic endurance and pain.  Pt returned to room following treatment. Pt left sitting on edge of  bed with family at bedside and all needs within reach.   Therapy Documentation Precautions:  Precautions Precautions: Fall, Back Precaution Comments: discussed verbally Required Braces or Orthoses: Spinal Brace Spinal Brace: Thoracolumbosacral orthotic, Applied in sitting position Restrictions Weight Bearing Restrictions: No General:   Pain: Pt c/o 7.5 back pain, but willing to participate with therapy.    Therapy/Group: Individual Therapy  Rayford Halsted 10/25/2022, 3:29 PM

## 2022-10-25 NOTE — Progress Notes (Signed)
Patient having increased mid back pain thought out the shift. Daughter requested a copy of patients medication list. Medication list provided per request. Patients daughter was not aware that her father had to ask for PRN Robaxin or Tylenol. Patients daughter educated  on PRN medications. PRN Robaxin 500 mg  provided x 2 per patients request.

## 2022-10-25 NOTE — IPOC Note (Signed)
Overall Plan of Care [facility]) Patient Details Name: Nicholas Potts MRN: [id number] DOB: [birth year]  Admitting Diagnosis: Dorsal (thoracic) vertebral fracture [facility])  Hospital Problems: Principal Problem:   Dorsal (thoracic) vertebral fracture (HCC)     Functional Problem List: Nursing Bowel  PT Pain  OT Balance, Motor, Sensory, Behavior, Skin Integrity, Cognition, Pain, Edema, Endurance, Safety  SLP    TR         Basic ADL's: OT Grooming, Bathing, Dressing, Toileting     Advanced  ADL's: OT Simple Meal Preparation, Laundry, Light Housekeeping     Transfers: PT Bed Mobility, Bed to Chair, Car, Occupational psychologist, Research scientist (life sciences): PT Ambulation, Stairs     Additional Impairments: OT None  SLP        TR      Anticipated Outcomes Item Anticipated Outcome  Self Feeding indep  Swallowing      Basic self-care  mod indep  Toileting  mod indep   Bathroom Transfers mod indep  Bowel/Bladder  n/a  Transfers  mod I with RW  Locomotion  mod I with RW  Communication     Cognition     Pain  < 4 with prns  Safety/Judgment  manage safety w cues   Therapy Plan: PT Intensity: Minimum of 1-2 x/day ,45 to 90 minutes PT Frequency: 5 out of 7 days PT Duration Estimated Length of Stay: 7-10 days OT Intensity: Minimum of 1-2 x/day, 45 to 90 minutes OT Frequency: 5 out of 7 days OT Duration/Estimated Length of Stay: 7-10 d     Team Interventions: Nursing Interventions Disease Management/Prevention, Medication Management, Discharge Planning, Patient/Family Education, Pain Management  PT interventions Ambulation/gait training, Cognitive remediation/compensation, Discharge planning, Functional mobility training, DME/adaptive equipment instruction, Pain management, Psychosocial support, Splinting/orthotics, Therapeutic Activities, UE/LE Strength taining/ROM, Visual/perceptual remediation/compensation, Wheelchair propulsion/positioning, UE/LE Coordination  activities, Therapeutic Exercise, Stair training, Skin care/wound management, Patient/family education, Neuromuscular re-education, Disease management/prevention, Firefighter, Warden/ranger, Development worker, international aid stimulation  OT Interventions Disease mangement/prevention, Warden/ranger, Firefighter, Development worker, international aid stimulation, Neuromuscular re-education, Equities trader education, Self Care/advanced ADL retraining, Splinting/orthotics, Therapeutic Exercise, UE/LE Coordination activities, Wheelchair propulsion/positioning, Cognitive remediation/compensation, Discharge planning, DME/adaptive equipment instruction, Functional mobility training, Pain management, Psychosocial support, Skin care/wound managment, Therapeutic Activities, UE/LE Strength taining/ROM  SLP Interventions    TR Interventions    SW/CM Interventions Discharge Planning, Psychosocial Support, Patient/Family Education   Barriers to Discharge MD  Medical stability, Home enviroment access/loayout, Weight, Weight bearing restrictions, and New oxygen  Nursing Decreased caregiver support, Home environment access/layout 1 level 5 ste no rail w family  PT Decreased caregiver support, Home environment access/layout    OT Inaccessible home environment, Home environment access/layout, Wound Care, New oxygen STE, dtr works, new incision and O2 needs  SLP      SW Decreased caregiver support, Lack of/limited family support, Insurance for SNF coverage, New oxygen Will determine if oxygen required at discharge.   Team Discharge Planning: Destination: PT-Home ,OT- Home , SLP-  Projected Follow-up: PT-Outpatient PT, OT-  Outpatient OT, SLP-  Projected Equipment Needs: PT-To be determined, OT- Rolling walker with 5" wheels, Tub/shower bench, SLP-  Equipment Details: PT- , OT-RW, TTB Patient/family involved in discharge planning: PT- Patient, Family member/caregiver,  OT-Patient, Family  member/caregiver, SLP-   MD ELOS: 7-10 days Medical Rehab Prognosis:  Good Assessment: The patient has been admitted for CIR therapies with the diagnosis of dorsal thoracic vertebral fx's s/p ORIF. The team will be addressing functional mobility, strength, stamina,  balance, safety, adaptive techniques and equipment, self-care, bowel and bladder mgt, patient and caregiver education, . Goals have been set at mod I. Anticipated discharge destination is home with daughters.        See Team Conference Notes for weekly updates to the plan of care

## 2022-10-26 DIAGNOSIS — R5383 Other fatigue: Secondary | ICD-10-CM | POA: Diagnosis not present

## 2022-10-26 DIAGNOSIS — G4733 Obstructive sleep apnea (adult) (pediatric): Secondary | ICD-10-CM

## 2022-10-26 DIAGNOSIS — K5900 Constipation, unspecified: Secondary | ICD-10-CM | POA: Diagnosis not present

## 2022-10-26 DIAGNOSIS — S22078A Other fracture of T9-T10 vertebra, initial encounter for closed fracture: Secondary | ICD-10-CM | POA: Diagnosis not present

## 2022-10-26 DIAGNOSIS — M545 Low back pain, unspecified: Secondary | ICD-10-CM

## 2022-10-26 LAB — GLUCOSE, CAPILLARY
Glucose-Capillary: 135 mg/dL — ABNORMAL HIGH (ref 70–99)
Glucose-Capillary: 104 mg/dL — ABNORMAL HIGH (ref 70–99)
Glucose-Capillary: 81 mg/dL (ref 70–99)
Glucose-Capillary: 202 mg/dL — ABNORMAL HIGH (ref 70–99)

## 2022-10-26 LAB — CBC
HCT: 40.5 % (ref 39.0–52.0)
Hemoglobin: 13.6 g/dL (ref 13.0–17.0)
MCH: 32.7 pg (ref 26.0–34.0)
MCHC: 33.6 g/dL (ref 30.0–36.0)
MCV: 97.4 fL (ref 80.0–100.0)
Platelets: 311 10*3/uL (ref 150–400)
RBC: 4.16 MIL/uL — ABNORMAL LOW (ref 4.22–5.81)
RDW: 12.2 % (ref 11.5–15.5)
WBC: 9.1 10*3/uL (ref 4.0–10.5)
nRBC: 0 % (ref 0.0–0.2)

## 2022-10-26 LAB — BASIC METABOLIC PANEL
Anion gap: 7 (ref 5–15)
BUN: 14 mg/dL (ref 8–23)
CO2: 28 mmol/L (ref 22–32)
Calcium: 8.5 mg/dL — ABNORMAL LOW (ref 8.9–10.3)
Chloride: 99 mmol/L (ref 98–111)
Creatinine, Ser: 1.07 mg/dL (ref 0.61–1.24)
GFR, Estimated: >60 mL/min (ref >60)
Glucose, Bld: 120 mg/dL — ABNORMAL HIGH (ref 70–99)
Potassium: 4.1 mmol/L (ref 3.5–5.1)
Sodium: 134 mmol/L — ABNORMAL LOW (ref 135–145)

## 2022-10-26 MED ORDER — POLYETHYLENE GLYCOL 3350 17 G PO PACK
17.0000 g | PACK | Freq: Every day | ORAL | Status: DC
  Administered 2022-10-26 – 2022-10-29 (×3): 17 g via ORAL
  Filled 2022-10-26 (×4): qty 1

## 2022-10-26 MED ORDER — SORBITOL 70 % SOLN: 30.0000 mL | Freq: Every day | Status: DC | PRN

## 2022-10-26 NOTE — Progress Notes (Signed)
Occupational Therapy Session Note  Patient Details  Name: Nicholas Potts MRN: [id number] Date of Birth: 10-11-1960  Today's Date: 10/26/2022 OT Individual Time: 0930-1040 OT Individual Time Calculation (min): 70 min    Short Term Goals: Week 1:  OT Short Term Goal 1 (Week 1): STG=LTG's d/t LOS  Skilled Therapeutic Interventions/Progress Updates:    Pt resting in w/c upon arrival with sister and interpreter present. Sister departed after approx 20 mins. Pt reports he is tired from earlier session but willing to try. Initial focus (with sister present) on discharge planning, DME and AE recommendations, and review of LTG. Pt amb with RW to ortho gym with standing rest breaks x 2. O2 sats 95% on 1L O2. Pt amb in gym and in hallway on RA. O2 sats >90%. Pt fatigues quickly requiring rest breaks. Pt performed sit<>stand from EOM and half squats 3x5 RA. Pt reports increase in pain with sit<>stand. Pt reports he is more comfortable sitting unsupported and leaning forward slightly. All amb with RW at [facility] level.Pt returned to room and sat in recliner with belt alarm actiavted. All needs within reach.   Therapy Documentation Precautions:  Precautions Precautions: Fall, Back Precaution Comments: discussed verbally Required Braces or Orthoses: Spinal Brace Spinal Brace: Thoracolumbosacral orthotic, Applied in sitting position Restrictions Weight Bearing Restrictions: No   Pain: Pain Assessment Pain Scale: 0-10 Pain Score: 7.5 in back Rest and repositioning   Therapy/Group: Individual Therapy  [name] 10/26/2022, 10:47 AM

## 2022-10-26 NOTE — Progress Notes (Signed)
PROGRESS NOTE   Subjective/Complaints:   Working with therapy in the gym this AM. No new concerns. Reports last BM was 2 days ago.   ROS:   Pt denies fever, chills, SOB, abd pain, CP, N/V/C/D, and vision changes  Except for HPI  Objective:   No results found. Recent Labs    10/26/22 0425  WBC 9.1  HGB 13.6  HCT 40.5  PLT 311    Recent Labs    10/26/22 0425  NA 134*  K 4.1  CL 99  CO2 28  GLUCOSE 120*  BUN 14  CREATININE 1.07  CALCIUM 8.5*     Intake/Output Summary (Last 24 hours) at 10/26/2022 1039 Last data filed at 10/26/2022 0600 Gross per 24 hour  Intake 676 ml  Output 2625 ml  Net -1949 ml         Physical Exam: Vital Signs Blood pressure 133/72, pulse 81, temperature 98.2 F (36.8 C), temperature source Oral, resp. rate 18, height 5\' 7"  (1.702 m), weight 120.3 kg, SpO2 97 %.      General: awake, alert, appropriate, working in gym with therapy, wearing TLSO HENT: conjugate gaze; oropharynx moist-wearing O2 1L by [place] CV: regular rate and rhythm; no JVD Pulmonary: CTA B/L; no W/R/R- good air movement GI: soft, NT, ND, (+)BS- more normoactive Psychiatric: appropriate- frustrated due to pain Neurological: Ox3- very awake Musculoskeletal:     Cervical back: Neck supple. No tenderness.     Right lower leg: No edema.     Left lower leg: No edema.     Comments: 5-/5 throughout- limited by pain mainly  Skin:    General: Skin is warm and dry.     Comments: Incision well approximated and healing without signs of infection Looks great- did not see today IV L hand IV - looks OK  Neurological:     General: No focal deficit present.     Mental Status: He is alert and oriented to person, place, and time.  Follows commands    Comments: Intact to light touch in all 4 extremities No increased tone No Hoffman's or clonus B/L   Assessment/Plan: 1. Functional deficits which require 3+ hours per  day of interdisciplinary therapy in a comprehensive inpatient rehab setting. Physiatrist is providing close team supervision and 24 hour management of active medical problems listed below. Physiatrist and rehab team continue to assess barriers to discharge/monitor patient progress toward functional and medical goals  Care Tool:  Bathing    Body parts bathed by patient: Right arm, Left arm, Chest, Abdomen, Front perineal area, Right upper leg, Left upper leg   Body parts bathed by helper: Buttocks, Right lower leg, Left lower leg, Face     Bathing assist Assist Level: Moderate Assistance - Patient 50 - 74%     Upper Body Dressing/Undressing Upper body dressing   What is the patient wearing?: Pull over shirt, Orthosis    Upper body assist Assist Level: Moderate Assistance - Patient 50 - 74%    Lower Body Dressing/Undressing Lower body dressing      What is the patient wearing?: Pants     Lower body assist Assist for lower body dressing:  Maximal Assistance - Patient 25 - 49%     Toileting Toileting    Toileting assist Assist for toileting: Moderate Assistance - Patient 50 - 74%     Transfers Chair/bed transfer  Transfers assist     Chair/bed transfer assist level: Contact Guard/Touching assist     Locomotion Ambulation   Ambulation assist      Assist level: Contact Guard/Touching assist Assistive device: Walker-rolling Max distance: 75   Walk 10 feet activity   Assist     Assist level: Contact Guard/Touching assist Assistive device: Walker-rolling   Walk 50 feet activity   Assist Walk 50 feet with 2 turns activity did not occur: Safety/medical concerns  Assist level: Contact Guard/Touching assist Assistive device: Walker-rolling    Walk 150 feet activity   Assist Walk 150 feet activity did not occur: Safety/medical concerns         Walk 10 feet on uneven surface  activity   Assist     Assist level: Contact Guard/Touching  assist Assistive device: Walker-rolling   Wheelchair     Assist Is the patient using a wheelchair?: Yes (transport only for time and energy conservation) Type of Wheelchair: Manual    Wheelchair assist level: Dependent - Patient 0%      Wheelchair 50 feet with 2 turns activity    Assist        Assist Level: Dependent - Patient 0%   Wheelchair 150 feet activity     Assist      Assist Level: Dependent - Patient 0%   Blood pressure 133/72, pulse 81, temperature 98.2 F (36.8 C), temperature source Oral, resp. rate 18, height 5\' 7"  (1.702 m), weight 120.3 kg, SpO2 97 %.  Medical Problem List and Plan: 1. Functional deficits secondary to thoracic fractures s/p open reduction and instrumentation form T9-T12- not SCI patient luckily.              -patient may  shower -cover incision and take brace off for showering, per surgeon             -ELOS/Goals: 10-12 days supervision   Con't CIR PT and OT IPOC today 2.  Antithrombotics: -DVT/anticoagulation:  Pharmaceutical: Lovenox             -antiplatelet therapy:    3. Pain Management: Tylenol, Norco, Robaxin as needed- will add Tramadol 50 mg q6 hours- can increase if necessary tomorrow.    5/17- not taking Norco much due to sedation- will increase Tramadol to 100 mg q6 hours due to pain control and less constipation  5/18- pt feels there's an improvement in pain with increase in tramadol- but not a lot of difference on Pain scale- he wants ot continue it  5/19- pain doing worse this AM- he thinks due to therapy last 2 days- advised to ask for Robaxin as needed/muscle relaxant.   5/20 pain controlled today, continue tramadol and PRN robaxin 4. Mood/Behavior/Sleep: LCSW to evaluate and provide emotional support             -antipsychotic agents: n/a   5. Neuropsych/cognition: This patient is capable of making decisions on his own behalf.   6. Skin/Wound Care: Routine skin care checks             -monitor surgical  incision   5/17- incision looks great-  7. Fluids/Electrolytes/Nutrition: Routine Is and Os and follow-up chemistries   8: Open reduction of T10-11 fracture and posteriolateral arthrodesis of T9-03-19-11             -  follow-up with Dr. Conchita Paris             -TLSO when out of bed   9: Hyperlipidemia: continue statin   10: OSA: BIPAP titration sleep study 12/14/21 required BIPAP 18/11 with residual minimum O2 sat 87% and mean 91.1%              -has home CPAP machine             -O2 supplementation via [place] to keep SaO2 >88%   5/17- CO2 33- starting to retain CO2- will reduce O2 to 2L max  5/18- using 1L O2- so CO2 should be better- will recheck Monday- more awake this AM  5/2- Co2 28 today, continue to monitor  11: Constipation-will give Sorbitol 60cc and if that doesn't work (refuses enema) then will give Mg citrate tomorrow after therapy.    5/17- no results with sorbitol- will order Mg citrate 1/2 bottle after therapy and 1/2 bottle at 8pm if no BM  5/18- 2 large BM's yesterday after 1/2 bottle Mg citrate  5/19- LBM x2 yesterday  5/20  start miralax daily, sorbitol PRN ordered LBM 5/18 12. Lethargy  5/18- took Provigil at home 200 mg daily- restarted for pt today.    5/19- doing better with Provigil in place  5/20 appears awake and alert oday   LOS: 4 days A FACE TO FACE EVALUATION WAS PERFORMED  Fanny Dance 10/26/2022, 10:39 AM

## 2022-10-26 NOTE — Progress Notes (Signed)
Occupational Therapy Session Note  Patient Details  Name: Nicholas Potts MRN: [id number] Date of Birth: 14-Oct-1960  Today's Date: 10/26/2022 OT Individual Time: 1400-1430 OT Individual Time Calculation (min): 30 min    Short Term Goals: Week 1:  OT Short Term Goal 1 (Week 1): STG=LTG's d/t LOS  Skilled Therapeutic Interventions/Progress Updates:    Pt resting in bed upon arrival. Supine>sit EOB with supervision using log roll technique. Pt donned TLSO independently. Amb 14' with RW on 1L O2-O2 sats 97%. Amb 52'x3 on RA with rest breaks. O2 sats>90%. All amb with CGA. Standing rest break X 2. Upon return to room, pt requested to sit EOB. All needs within reach. Pt on 1L O2. Bed alarm actiavted. Nursing staff notified.  Therapy Documentation Precautions:  Precautions Precautions: Fall, Back Precaution Comments: discussed verbally Required Braces or Orthoses: Spinal Brace Spinal Brace: Thoracolumbosacral orthotic, Applied in sitting position Restrictions Weight Bearing Restrictions: No Pain:  Pt reports 8/10 pain in mid back; rest and repositioning    Therapy/Group: Individual Therapy  [name] 10/26/2022, 2:50 PM

## 2022-10-26 NOTE — Progress Notes (Signed)
Pt placed self on home cpap machine  

## 2022-10-26 NOTE — Progress Notes (Signed)
Physical Therapy Session Note  Patient Details  Name: Nicholas Potts MRN: [id number] Date of Birth: 1960/11/14  Today's Date: 10/26/2022 PT Individual Time: 0800-0900, 1115-1200 PT Individual Time Calculation (min): 60 min, 45 min   Short Term Goals: Week 1:  PT Short Term Goal 1 (Week 1): =LTGs d/t ELOS  Skilled Therapeutic Interventions/Progress Updates:    Session 1: Pt seated EOB on RA on arrival. SpO2 found to be 91%, up to 97 with pt consciously taking deep breaths but pt could not maintain. Pt on 1L throughout session, but trialled on .5L during gait. Pt able to maintain > 90%, but unable to return to >96% even with consistent cues to breathe deeply and extended seated rest. Pt also required more rest breaks in same gait distance and reports incr shortness of breath. On 1L, SpO2=95% or higher with gait and fewer standing rest breaks.   Pt reports pain 7/10 at rest and 7.5/10 with activity, premedicated. Rest and positioning provided as needed.   Session focused on GT for cardiovascular endurance and functional mobility. Pt ambulated x110 ft, x 150 ft, 3 x 90 ft with RW and CGA throughout. Gait pattern slow but WNL, occasional cues for RW proximity. Pt educated on and performed standing rest breaks for improved endurance.   Pt then performed Elevated lunges 2 x 10 on 4" step for progression to stair training. Pt performed within tolerable range for pain management.   Pt returned to room and set up in recliner with his daughter present.   Session 2: Pt recd sitting EOB on 1L O2. Maintained on 1 L throughout and remained >95% with activity this session. Some shortness of breath at times, managed with standing rest breaks.   Pt reports up to 8/10 pain, but largely 7.5/10. Nsg administered pain medication during session.   Session focused on GT with RW and CGA-supervision. Pt ambulated x ~80 ft before resting and requesting pain medication. Pt then walked >250 ft with standing  rest  breaks every ~60-80 ft. Pt able to self manage rests well without cueing.   Pt then utilized UBE for continued aerobic activity with decreased back pain. 2 x 2 min (fwd and back) at level 3. Pt then returned to room and performed ambulatory transfer to EOB with RW and supervision, was left with all needs in reach and alarm active.   Therapy Documentation Precautions:  Precautions Precautions: Fall, Back Precaution Comments: discussed verbally Required Braces or Orthoses: Spinal Brace Spinal Brace: Thoracolumbosacral orthotic, Applied in sitting position Restrictions Weight Bearing Restrictions: No General:       Therapy/Group: Individual Therapy  Juluis Rainier 10/26/2022, 10:08 AM

## 2022-10-26 NOTE — Progress Notes (Signed)
Patient ID: Nicholas Potts, male   DOB: July 06, 1960, 62 y.o.   MRN: [id number]  SW met with pt dtr Nicholas Potts in pt room while pt in OT session. SW provided statement of service. SW shared will confirm d/c needs tomorrow after team conference. She reports pt will be home alone for a few hours because she has to go to work. SW informed will share in team conference tomorrow; OT did not think intermittent level of care would be an issue. SW reiterated will confirm after team conference.   *SW later received phone call from pt dtr reporting pt complained about his bed being stuck in one position. SW asked if this was shared with nursing. Reports that it has been. SW shared will discuss with nursing, and will have someone follow-up once there is an update.   SW discussed above concern with nursing and was told bed was swapped out. SW called pt dtr Nicholas Potts to inform.   Cecile Sheerer, MSW, LCSWA Office: [phone] Cell: [phone] Fax: [phone]

## 2022-10-26 NOTE — Care Management (Signed)
[facility] Individual Statement of Services  Patient Name:  Nicholas Potts  Date:  10/26/2022  Welcome to the [facility]patient [facility].  Our goal is to provide you with an individualized program based on your diagnosis and situation, designed to meet your specific needs.  With this comprehensive rehabilitation program, you will be expected to participate in at least 3 hours of rehabilitation therapies Monday-Friday, with modified therapy programming on the weekends.  Your rehabilitation program will include the following services:  Physical Therapy (PT), Occupational Therapy (OT), 24 hour per day rehabilitation nursing, Therapeutic Recreaction (TR), Psychology, Neuropsychology, Care Coordinator, Rehabilitation Medicine, Nutrition Services, Pharmacy Services, and Other  Weekly team conferences will be held on Tuesdays to discuss your progress.  Your Inpatient Rehabilitation Care Coordinator will talk with you frequently to get your input and to update you on team discussions.  Team conferences with you and your family in attendance may also be held.  Expected length of stay: 7-10 days    Overall anticipated outcome: Independent with Assistive Device  Depending on your progress and recovery, your program may change. Your Inpatient Rehabilitation Care Coordinator will coordinate services and will keep you informed of any changes. Your Inpatient Rehabilitation Care Coordinator's name and contact numbers are listed  below.  The following services may also be recommended but are not provided by the Inpatient Rehabilitation Center:  Driving Evaluations Home Health Rehabiltiation Services Outpatient Rehabilitation Services Vocational Rehabilitation   Arrangements will be made to provide these services after discharge if needed.  Arrangements include referral to agencies that provide these services.  Your insurance has been verified to be:  [employer]  Your primary doctor  is:  Meredith Staggers  Pertinent information will be shared with your doctor and your insurance company.  Inpatient Rehabilitation Care Coordinator:  Susie Cassette [phone] or ([phone]  Information discussed with and copy given to patient by: Gretchen Short, 10/26/2022, 9:39 AM

## 2022-10-27 DIAGNOSIS — S22078A Other fracture of T9-T10 vertebra, initial encounter for closed fracture: Secondary | ICD-10-CM | POA: Diagnosis not present

## 2022-10-27 LAB — GLUCOSE, CAPILLARY
Glucose-Capillary: 113 mg/dL — ABNORMAL HIGH (ref 70–99)
Glucose-Capillary: 73 mg/dL (ref 70–99)
Glucose-Capillary: 199 mg/dL — ABNORMAL HIGH (ref 70–99)
Glucose-Capillary: 140 mg/dL — ABNORMAL HIGH (ref 70–99)

## 2022-10-27 MED ORDER — SORBITOL 70 % SOLN
30.0000 mL | Freq: Once | Status: AC
  Administered 2022-10-27: 30 mL via ORAL
  Filled 2022-10-27: qty 30

## 2022-10-27 NOTE — Progress Notes (Signed)
Physical Therapy Session Note  Patient Details  Name: Nicholas Potts MRN: [id number] Date of Birth: Sep 22, 1960  Today's Date: 10/27/2022 PT Individual Time: 0800-0900 PT Individual Time Calculation (min): 60 min   Short Term Goals: Week 1:  PT Short Term Goal 1 (Week 1): =LTGs d/t ELOS  Skilled Therapeutic Interventions/Progress Updates:    Pt seated EOB with his daughter present and providing interpretation throughout session. Pt reports up to 8/10 shortly before session, but he recd pain meds and it was down to 7/10 by end of session even with mobility. Pt on room air on arrival. Maintained on RA, but monitored closely and brought supp O2, but pt maintain >95% through entire session.  Pt ambulated 120-150 ft x 3 during session. Pt took self directed standing rest breaks to manage endurance and was able to manage without desat. CGA to close supervision with RW. Slow gait pattern with cues for RW proximity. Session focused on stair training. Pt was able to navigate 4" step with RW. When transitioned to gym stairs, pt unable to step all the way up d/t pain. Problem solved different options as pt's rails are too wide to reach. Pt prefers lateral technique to offload back, and was closest to full step up with this technique. Pt reports "I think I will be able to do it day after tomorrow."  Pt returned to room and to EOB, was left with all needs in reach and alarm active. Returned to wall O2 at 1L.  Therapy Documentation Precautions:  Precautions Precautions: Fall, Back Precaution Comments: discussed verbally Required Braces or Orthoses: Spinal Brace Spinal Brace: Thoracolumbosacral orthotic, Applied in sitting position Restrictions Weight Bearing Restrictions: No General:       Therapy/Group: Individual Therapy  Juluis Rainier 10/27/2022, 12:55 PM

## 2022-10-27 NOTE — Progress Notes (Signed)
PROGRESS NOTE   Subjective/Complaints:   Pt reports LBM 3 days ago- feeling somewhat constipated.  Passing a lot of gas.  Pain more this am- was 7/10 yesterday, but more muscle tightness, so 8/10 today.   Bed beeping all night- Ate 100% tray this AM.   ROS:  Pt denies SOB, abd pain, CP, N/V/C/D, and vision changes  Except for HPI  Objective:   No results found. Recent Labs    10/26/22 0425  WBC 9.1  HGB 13.6  HCT 40.5  PLT 311   Recent Labs    10/26/22 0425  NA 134*  K 4.1  CL 99  CO2 28  GLUCOSE 120*  BUN 14  CREATININE 1.07  CALCIUM 8.5*    Intake/Output Summary (Last 24 hours) at 10/27/2022 0850 Last data filed at 10/27/2022 0700 Gross per 24 hour  Intake 690 ml  Output --  Net 690 ml        Physical Exam: Vital Signs Blood pressure (!) 104/53, pulse 82, temperature 98.7 F (37.1 C), resp. rate 18, height 5\' 7"  (1.702 m), weight 120.3 kg, SpO2 100 %.       General: awake, alert, appropriate, wearing TLSO- daughter at bedside; NAD HENT: conjugate gaze; oropharynx moist-wearing 1L O2 by [place] CV: regular rate and rhythm; no JVD Pulmonary: CTA B/L; no W/R/R- good air movement- sounds good GI: soft, NT, protuberant vs distended; (+) hypoactive BS Psychiatric: appropriate- pleasant Neurological: Ox3  Musculoskeletal: very tight paraspinals on back entire thoracic area    Cervical back: Neck supple. No tenderness.     Right lower leg: No edema.     Left lower leg: No edema.     Comments: 5-/5 throughout- limited by pain mainly  Skin:    General: Skin is warm and dry.     Comments: Incision well approximated and healing without signs of infection Looks great- did not see today IV L hand IV - looks OK  Neurological:     General: No focal deficit present.     Mental Status: He is alert and oriented to person, place, and time.  Follows commands    Comments: Intact to light touch in all 4  extremities No increased tone No Hoffman's or clonus B/L   Assessment/Plan: 1. Functional deficits which require 3+ hours per day of interdisciplinary therapy in a comprehensive inpatient rehab setting. Physiatrist is providing close team supervision and 24 hour management of active medical problems listed below. Physiatrist and rehab team continue to assess barriers to discharge/monitor patient progress toward functional and medical goals  Care Tool:  Bathing    Body parts bathed by patient: Right arm, Left arm, Chest, Abdomen, Front perineal area, Right upper leg, Left upper leg   Body parts bathed by helper: Buttocks, Right lower leg, Left lower leg, Face     Bathing assist Assist Level: Moderate Assistance - Patient 50 - 74%     Upper Body Dressing/Undressing Upper body dressing   What is the patient wearing?: Pull over shirt, Orthosis    Upper body assist Assist Level: Moderate Assistance - Patient 50 - 74%    Lower Body Dressing/Undressing Lower body dressing  What is the patient wearing?: Pants     Lower body assist Assist for lower body dressing: Maximal Assistance - Patient 25 - 49%     Toileting Toileting    Toileting assist Assist for toileting: Moderate Assistance - Patient 50 - 74%     Transfers Chair/bed transfer  Transfers assist     Chair/bed transfer assist level: Contact Guard/Touching assist     Locomotion Ambulation   Ambulation assist      Assist level: Contact Guard/Touching assist Assistive device: Walker-rolling Max distance: 75   Walk 10 feet activity   Assist     Assist level: Contact Guard/Touching assist Assistive device: Walker-rolling   Walk 50 feet activity   Assist Walk 50 feet with 2 turns activity did not occur: Safety/medical concerns  Assist level: Contact Guard/Touching assist Assistive device: Walker-rolling    Walk 150 feet activity   Assist Walk 150 feet activity did not occur:  Safety/medical concerns         Walk 10 feet on uneven surface  activity   Assist     Assist level: Contact Guard/Touching assist Assistive device: Walker-rolling   Wheelchair     Assist Is the patient using a wheelchair?: Yes (transport only for time and energy conservation) Type of Wheelchair: Manual    Wheelchair assist level: Dependent - Patient 0%      Wheelchair 50 feet with 2 turns activity    Assist        Assist Level: Dependent - Patient 0%   Wheelchair 150 feet activity     Assist      Assist Level: Dependent - Patient 0%   Blood pressure (!) 104/53, pulse 82, temperature 98.7 F (37.1 C), resp. rate 18, height 5\' 7"  (1.702 m), weight 120.3 kg, SpO2 100 %.  Medical Problem List and Plan: 1. Functional deficits secondary to thoracic fractures s/p open reduction and instrumentation form T9-T12- not SCI patient luckily.              -patient may  shower -cover incision and take brace off for showering, per surgeon             -ELOS/Goals: 10-12 days supervision   Con't CIR PT and OT  Team conference today to determine length of stay 2.  Antithrombotics: -DVT/anticoagulation:  Pharmaceutical: Lovenox             -antiplatelet therapy:    3. Pain Management: Tylenol, Norco, Robaxin as needed- will add Tramadol 50 mg q6 hours- can increase if necessary tomorrow.    5/17- not taking Norco much due to sedation- will increase Tramadol to 100 mg q6 hours due to pain control and less constipation  5/18- pt feels there's an improvement in pain with increase in tramadol- but not a lot of difference on Pain scale- he wants ot continue it  5/19- pain doing worse this AM- he thinks due to therapy last 2 days- advised to ask for Robaxin as needed/muscle relaxant.   5/20 pain controlled today, continue tramadol and PRN robaxin  5/21- reminded pt to ask for robaxin prn and educated about muscle splinting- reason he hurts more after working with therapy  more.  4. Mood/Behavior/Sleep: LCSW to evaluate and provide emotional support             -antipsychotic agents: n/a   5. Neuropsych/cognition: This patient is capable of making decisions on his own behalf.   6. Skin/Wound Care: Routine skin care checks             -  monitor surgical incision   5/17- incision looks great-  7. Fluids/Electrolytes/Nutrition: Routine Is and Os and follow-up chemistries   8: Open reduction of T10-11 fracture and posteriolateral arthrodesis of T9-03-19-11             -follow-up with Dr. Conchita Paris             -TLSO when out of bed   9: Hyperlipidemia: continue statin   10: OSA: BIPAP titration sleep study 12/14/21 required BIPAP 18/11 with residual minimum O2 sat 87% and mean 91.1%              -has home CPAP machine             -O2 supplementation via [place] to keep SaO2 >88%   5/17- CO2 33- starting to retain CO2- will reduce O2 to 2L max  5/18- using 1L O2- so CO2 should be better- will recheck Monday- more awake this AM  5/20- Co2 28 today, continue to monitor   5/21- using 1L O2 during day as well-  11: Constipation-will give Sorbitol 60cc and if that doesn't work (refuses enema) then will give Mg citrate tomorrow after therapy.    5/17- no results with sorbitol- will order Mg citrate 1/2 bottle after therapy and 1/2 bottle at 8pm if no BM  5/18- 2 large BM's yesterday after 1/2 bottle Mg citrate  5/19- LBM x2 yesterday  5/20  start miralax daily, sorbitol PRN ordered LBM 5/18  5/21- LBM 3 days ago- if no BM by 5pm, will give Sorbitol 30cc 12. Lethargy  5/18- took Provigil at home 200 mg daily- restarted for pt today.    5/19- doing better with Provigil in place  5/20 appears awake and alert oday  I spent a total of  54  minutes on total care today- >50% coordination of care- due to education on muscle itghtn/ess, splinting effect, d/w nursing about bed and constipation- d/w daughter about Provigil; asked pt to ASK for muscle relaxants- Robaxin. Also team  conference to determine length of stay    LOS: 5 days A FACE TO FACE EVALUATION WAS PERFORMED  Martavia Tye 10/27/2022, 8:50 AM

## 2022-10-27 NOTE — Progress Notes (Signed)
Patient ID: Nicholas Potts, male   DOB: 1960/12/14, 62 y.o.   MRN: [id number]  1200-SW spoke with pt dtr [name] to provide updates from team conference, d/c date 5/25, and d/c recs- outpatient PT, and DME- HIP kit, RW, TTB, and 3in1 BSC. Pt dtr did not think 3in1 BSC is needed. Clarified intermittent supervision is appropriate. SW will fax outpatient referral to [facility]   SW ordered RW and TTB with [employer] via parachute.   *SW will follow-up with pt to provide updates from team conference.   Cecile Sheerer, MSW, LCSWA Office: [phone] Cell: [phone] Fax: [phone]

## 2022-10-27 NOTE — Patient Care Conference (Signed)
Inpatient RehabilitationTeam Conference and Plan of Care Update Date: 10/27/2022   Time: 11:00 AM   Patient Name: Nicholas Potts      Medical Record Number: [id number]  Date of Birth: 09-10-1960 Sex: Male         Room/Bed: 4M01C/4M01C-01 Payor Info: Payor: BLUE CROSS BLUE SHIELD / Plan: BCBS COMM PPO / Product Type: *No Product type* /    Admit Date/Time:  10/22/2022  2:17 PM  Primary Diagnosis:  Dorsal (thoracic) vertebral fracture [facility])  Hospital Problems: Principal Problem:   Dorsal (thoracic) vertebral fracture [facility])    Expected Discharge Date: Expected Discharge Date: 10/31/22  Team Members Present: Physician leading conference: Dr. Genice Rouge Social Worker Present: Cecile Sheerer, LCSWA Nurse Present: Vedia Pereyra, RN PT Present: Bernie Covey, PT OT Present: Roney Mans, OT;Ardis Rowan, COTA PPS Coordinator present : Fae Pippin, SLP     Current Status/Progress Goal Weekly Team Focus  Bowel/Bladder     Continent B/B  Remain continent   Toilet every 4 hours and PRN   Swallow/Nutrition/ Hydration               ADL's   bathing-min A; LB dressing-CGA; UB dressing-supervision; tointing-min A; functional transfers-CGA; limited endurance   mod I overall   ADLs, safety awareness, activity tolerane/endurance    Mobility   CGA mobility, gait >200 ft with rest breaks, limited by CV endurance.   mod I overall  endurance    Communication                Safety/Cognition/ Behavioral Observations               Pain    7/10 back pain   Less than 4 with PRN medications  Assess every 4 hours and PRN   Skin    Incision to back with glue no drainage.    Incision to remain intact and free of infection  Assess every shift and PRN     Discharge Planning:  Pt will d/c to home with his daughter [name] whom he lives with- she will be able to assist in the evening as she works during the day. His dtr [name] will help during the day as she  has more flexible work schedule. Pt will need to be intermittent on days [name] has to go to work and will be home alone for a few hours. SW will confirm there are no barriers to discharge.   Team Discussion: Dorsal vertebral fracture. TLSO brace OOB. Increase in muscle spasms. Needs reminding to take PRN dose. Pain managed with scheduled Tramadol. Trazodone for sleep. Increase bowel medications for constipation. Working on diet education with family. Patient does require cues to breathe when walking due to pain.  Requires assistance with CPAP when sleeping and removes. Will have intermittent supervision at home a few hours a day.  Patient on target to meet rehab goals: yes, will be a goal level by discharge date 10/31/22.   *See Care Plan and progress notes for long and short-term goals.   Revisions to Treatment Plan:  Medication adjustments, monitor labs/VS Teaching Needs: Medications, diet modifications, safety, self care, gait/transfer training, skin care, etc.   Current Barriers to Discharge: Decreased caregiver support, Wound care, and Weight  Possible Resolutions to Barriers: Family education, diet education, monitor incision for s/s of infection, energy conservation, order recommended DME.     Medical Summary Current Status: continent B/B- working on dietary education; scheduled tramadol for back pain and prn robaxin.  daughter  puts CPAP back on for pt;  Barriers to Discharge: Complicated Wound;Uncontrolled Pain;Weight bearing restrictions;Self-care education;Other (comments);Morbid Obesity  Barriers to Discharge Comments: significant constipation- did Shower without O2- >90%- needs more adaptive equipment-- goals mod I; Possible Resolutions to [name]: limited by pain- cannot increase meds- also endurance- improving- and muscle spasms- and O2- weaning O2- and needs to remember ot take robaxin- d/c- 5/25   Continued Need for Acute Rehabilitation Level of Care: The  patient requires daily medical management by a physician with specialized training in physical medicine and rehabilitation for the following reasons: Direction of a multidisciplinary physical rehabilitation program to maximize functional independence : Yes Medical management of patient stability for increased activity during participation in an intensive rehabilitation regime.: Yes Analysis of laboratory values and/or radiology reports with any subsequent need for medication adjustment and/or medical intervention. : Yes   I attest that I was present, lead the team conference, and concur with the assessment and plan of the team.   [name] 10/27/2022, 4:46 PM

## 2022-10-27 NOTE — Progress Notes (Signed)
Occupational Therapy Session Note  Patient Details  Name: Nicholas Potts MRN: [id number] Date of Birth: 21-Jul-1960  Today's Date: 10/27/2022 OT Individual Time: 4098-1191 OT Individual Time Calculation (min): 55 min    Short Term Goals: Week 1:  OT Short Term Goal 1 (Week 1): STG=LTG's d/t LOS  Skilled Therapeutic Interventions/Progress Updates:    Pt sitting EOB upon arrival with daughter present. Pt agreeable to bathing at shower level and dressing with sit<>stand from seat. Amb with RW into bathroom with supervision. Pt doffed TLSO and clothing without assistance except to doff socks without AE. Incision covered. Pt required assistance bathing buttocks and lower LE without use of AE but completed all other tasks without assistance. Pt required assistance threading LE into pants and donning socks without use of AE but completed all other dressing tasks without assistance. Pt returned to room and sat EOB. Discussed use of AE to assist with bathing/dressing tasks. Will demonstrate in later therapy session. Pt remained seated EOB with daughter present. Bed alarm activaed.   Therapy Documentation Precautions:  Precautions Precautions: Fall, Back Precaution Comments: discussed verbally Required Braces or Orthoses: Spinal Brace Spinal Brace: Thoracolumbosacral orthotic, Applied in sitting position Restrictions Weight Bearing Restrictions: No Pain: Pt c/o 7/10 back pain; warm shower and repositioned   Therapy/Group: Individual Therapy  [name] 10/27/2022, 9:56 AM

## 2022-10-27 NOTE — Progress Notes (Signed)
Placed patient on home CPAP for the night  

## 2022-10-27 NOTE — Progress Notes (Signed)
Physical Therapy Session Note  Patient Details  Name: Nicholas Potts MRN: [id number] Date of Birth: 02/19/1961  Today's Date: 10/27/2022 PT Individual Time: 1115-1158 PT Individual Time Calculation (min): 43 min   Short Term Goals: Week 1:  PT Short Term Goal 1 (Week 1): =LTGs d/t ELOS  Skilled Therapeutic Interventions/Progress Updates:    Chart reviewed and pt agreeable to therapy. Pt received seated EOB with c/o pain in back that was not quantified. Also of note, pt SpO2 continuously checked and remained >97% t/o session. Session focused on amb quality and activity tolerance to promote safe home access. Pt initiated session with amb to/from toilet using CGA + RW t/o and pt was independent with toileting. Pt then completed stand in place balance with CGA + no AD, with plan to attempt amb with no AD, however pt c/o increase in pn. Pt then transferred to therapy gym for time management. Pt completed 6 mins + 6 mins on NuStep for interval training with 1 rest break at workloads 1-6. Pt then returned to room and amb to sit EOB with CGA + RW. At end of session, pt was left seated EOB with alarm engaged, nurse call bell and all needs in reach.     Therapy Documentation Precautions:  Precautions Precautions: Fall, Back Precaution Comments: discussed verbally Required Braces or Orthoses: Spinal Brace Spinal Brace: Thoracolumbosacral orthotic, Applied in sitting position Restrictions Weight Bearing Restrictions: No      Therapy/Group: Individual Therapy  Dionne Milo, PT, DPT 10/27/2022, 12:00 PM

## 2022-10-27 NOTE — Progress Notes (Signed)
Occupational Therapy Session Note  Patient Details  Name: Nic Lerner MRN: [id number] Date of Birth: 1961/03/30  Today's Date: 10/27/2022 OT Individual Time: 1345-1425 OT Individual Time Calculation (min): 40 min    Short Term Goals: Week 1:  OT Short Term Goal 1 (Week 1): STG=LTG's d/t LOS  Skilled Therapeutic Interventions/Progress Updates:    OT intervention with focus on AE use practicing donning/doffing socks with sock aide. Pt issued LHS and pt demonstrated use of reacher. Pt amb with RW to ADL apartment and practiced sit<>supine EOB and sit<>stand from couch. Pt requires rest breaks for pain mgmt during activities. Pt donned/doffed TLSO with supervision. Pt returned to room. Pt returned to bed. All needs within reach. Bed alarm activated.   Therapy Documentation Precautions:  Precautions Precautions: Fall, Back Precaution Comments: discussed verbally Required Braces or Orthoses: Spinal Brace Spinal Brace: Thoracolumbosacral orthotic, Applied in sitting position Restrictions Weight Bearing Restrictions: No Pain:  Pt c/o 7/10 back pain escalating when amb and during sit<>stand, alleviated with rest   Therapy/Group: Individual Therapy  [name] 10/27/2022, 2:32 PM

## 2022-10-28 DIAGNOSIS — S22079A Unspecified fracture of T9-T10 vertebra, initial encounter for closed fracture: Secondary | ICD-10-CM | POA: Diagnosis not present

## 2022-10-28 LAB — GLUCOSE, CAPILLARY
Glucose-Capillary: 100 mg/dL — ABNORMAL HIGH (ref 70–99)
Glucose-Capillary: 71 mg/dL (ref 70–99)
Glucose-Capillary: 125 mg/dL — ABNORMAL HIGH (ref 70–99)
Glucose-Capillary: 135 mg/dL — ABNORMAL HIGH (ref 70–99)

## 2022-10-28 MED FILL — Sodium Chloride IV Soln 0.9%: INTRAVENOUS | Qty: 1000 | Status: AC

## 2022-10-28 MED FILL — Heparin Sodium (Porcine) Inj 1000 Unit/ML: INTRAMUSCULAR | Qty: 30 | Status: AC

## 2022-10-28 NOTE — Progress Notes (Signed)
PROGRESS NOTE   Subjective/Complaints:   Bed got fixed- not beeping anymore.  Also, had 3 Bms yesterday after sorbitol- going much better Spasms less when taking Robaxin Pain 7/10 this AM- so doing better ROS:   Pt denies SOB, abd pain, CP, N/V/C/D, and vision changes  Except for HPI  Objective:   No results found. Recent Labs    10/26/22 0425  WBC 9.1  HGB 13.6  HCT 40.5  PLT 311   Recent Labs    10/26/22 0425  NA 134*  K 4.1  CL 99  CO2 28  GLUCOSE 120*  BUN 14  CREATININE 1.07  CALCIUM 8.5*    Intake/Output Summary (Last 24 hours) at 10/28/2022 0847 Last data filed at 10/28/2022 0742 Gross per 24 hour  Intake 594 ml  Output --  Net 594 ml        Physical Exam: Vital Signs Blood pressure (!) 101/45, pulse 84, temperature 97.8 F (36.6 C), temperature source Oral, resp. rate 18, height 5\' 7"  (1.702 m), weight 120.3 kg, SpO2 96 %.        General: awake, alert, appropriate, sitting up at EOB with daughter [name] in room; NAD HENT: conjugate gaze; oropharynx moist CV: regular rate and rhythm,  no JVD Pulmonary: CTA B/L; no W/R/R- good air movement GI: soft, NT, ND, (+)BS Psychiatric: appropriate- interactive Neurological: Ox3  Musculoskeletal: very tight paraspinals on back entire thoracic area- still tight, but not quite as tight- also wearing TLSO    Cervical back: Neck supple. No tenderness.     Right lower leg: No edema.     Left lower leg: No edema.     Comments: 5-/5 throughout- limited by pain mainly  Skin:    General: Skin is warm and dry.     Comments: Incision well approximated and healing without signs of infection Looks great- did not see today IV L hand IV - looks OK  Neurological:     General: No focal deficit present.     Mental Status: He is alert and oriented to person, place, and time.  Follows commands    Comments: Intact to light touch in all 4 extremities No  increased tone No Hoffman's or clonus B/L   Assessment/Plan: 1. Functional deficits which require 3+ hours per day of interdisciplinary therapy in a comprehensive inpatient rehab setting. Physiatrist is providing close team supervision and 24 hour management of active medical problems listed below. Physiatrist and rehab team continue to assess barriers to discharge/monitor patient progress toward functional and medical goals  Care Tool:  Bathing    Body parts bathed by patient: Right arm, Left arm, Chest, Abdomen, Front perineal area, Right upper leg, Left upper leg (without AE)   Body parts bathed by helper: Buttocks, Right lower leg, Left lower leg, Face     Bathing assist Assist Level: Minimal Assistance - Patient > 75%     Upper Body Dressing/Undressing Upper body dressing   What is the patient wearing?: Pull over shirt, Orthosis    Upper body assist Assist Level: Set up assist    Lower Body Dressing/Undressing Lower body dressing      What is the patient  wearing?: Pants     Lower body assist Assist for lower body dressing: Contact Guard/Touching assist     Toileting Toileting    Toileting assist Assist for toileting: Moderate Assistance - Patient 50 - 74%     Transfers Chair/bed transfer  Transfers assist     Chair/bed transfer assist level: Contact Guard/Touching assist     Locomotion Ambulation   Ambulation assist      Assist level: Contact Guard/Touching assist Assistive device: Walker-rolling Max distance: 75   Walk 10 feet activity   Assist     Assist level: Contact Guard/Touching assist Assistive device: Walker-rolling   Walk 50 feet activity   Assist Walk 50 feet with 2 turns activity did not occur: Safety/medical concerns  Assist level: Contact Guard/Touching assist Assistive device: Walker-rolling    Walk 150 feet activity   Assist Walk 150 feet activity did not occur: Safety/medical concerns         Walk 10 feet  on uneven surface  activity   Assist     Assist level: Contact Guard/Touching assist Assistive device: Walker-rolling   Wheelchair     Assist Is the patient using a wheelchair?: Yes (transport only for time and energy conservation) Type of Wheelchair: Manual    Wheelchair assist level: Dependent - Patient 0%      Wheelchair 50 feet with 2 turns activity    Assist        Assist Level: Dependent - Patient 0%   Wheelchair 150 feet activity     Assist      Assist Level: Dependent - Patient 0%   Blood pressure (!) 101/45, pulse 84, temperature 97.8 F (36.6 C), temperature source Oral, resp. rate 18, height 5\' 7"  (1.702 m), weight 120.3 kg, SpO2 96 %.  Medical Problem List and Plan: 1. Functional deficits secondary to thoracic fractures s/p open reduction and instrumentation form T9-T12- not SCI patient luckily.              -patient may  shower -cover incision and take brace off for showering, per surgeon             -ELOS/Goals: 10-12 days supervision   D/c 5/25 Con't CIR PT and OT- went over d/c plans with pt/daughter 2.  Antithrombotics: -DVT/anticoagulation:  Pharmaceutical: Lovenox             -antiplatelet therapy:    3. Pain Management: Tylenol, Norco, Robaxin as needed- will add Tramadol 50 mg q6 hours- can increase if necessary tomorrow.    5/17- not taking Norco much due to sedation- will increase Tramadol to 100 mg q6 hours due to pain control and less constipation  5/18- pt feels there's an improvement in pain with increase in tramadol- but not a lot of difference on Pain scale- he wants ot continue it  5/19- pain doing worse this AM- he thinks due to therapy last 2 days- advised to ask for Robaxin as needed/muscle relaxant.   5/20 pain controlled today, continue tramadol and PRN robaxin  5/21- reminded pt to ask for robaxin prn and educated about muscle splinting- reason he hurts more after working with therapy more. 5/22- educated pt/daughter  that pt will get 7 days of pain meds at d/c, then will need to get pain meds from surgeon/PCP after that- taking tramadol 100 mg QID- pain 7/10 today- doing slightly better  4. Mood/Behavior/Sleep: LCSW to evaluate and provide emotional support             -antipsychotic  agents: n/a   5. Neuropsych/cognition: This patient is capable of making decisions on his own behalf.   6. Skin/Wound Care: Routine skin care checks             -monitor surgical incision   5/17- incision looks great-  7. Fluids/Electrolytes/Nutrition: Routine Is and Os and follow-up chemistries   8: Open reduction of T10-11 fracture and posteriolateral arthrodesis of T9-03-19-11             -follow-up with Dr. Conchita Paris             -TLSO when out of bed   9: Hyperlipidemia: continue statin   10: OSA: BIPAP titration sleep study 12/14/21 required BIPAP 18/11 with residual minimum O2 sat 87% and mean 91.1%              -has home CPAP machine             -O2 supplementation via [place] to keep SaO2 >88%   5/17- CO2 33- starting to retain CO2- will reduce O2 to 2L max  5/18- using 1L O2- so CO2 should be better- will recheck Monday- more awake this AM  5/20- Co2 28 today, continue to monitor   5/21- using 1L O2 during day as well-   5/22- weaning O2 - doesn't meet criteria to go home on it 11: Constipation-will give Sorbitol 60cc and if that doesn't work (refuses enema) then will give Mg citrate tomorrow after therapy.    5/17- no results with sorbitol- will order Mg citrate 1/2 bottle after therapy and 1/2 bottle at 8pm if no BM  5/18- 2 large BM's yesterday after 1/2 bottle Mg citrate  5/19- LBM x2 yesterday  5/20  start miralax daily, sorbitol PRN ordered LBM 5/18  5/21- LBM 3 days ago- if no BM by 5pm, will give Sorbitol 30cc  5/22- BM x3 yesterday- feels much better 12. Lethargy  5/18- took Provigil at home 200 mg daily- restarted for pt today.    5/19- doing better with Provigil in place  5/20 appears awake and alert  oday   I spent a total of 41   minutes on total care today- >50% coordination of care- due to prolonged period of time in room d/w pt and daughter, who translated, about d/c meds- including pain meds and will need to get from PCP or surgeon after d/c.   LOS: 6 days A FACE TO FACE EVALUATION WAS PERFORMED  Tamsin Nader 10/28/2022, 8:47 AM

## 2022-10-28 NOTE — Progress Notes (Signed)
Patient ID: Nicholas Potts, male   DOB: March 11, 1961, 62 y.o.   MRN: [id number]  SW faxed outpatient PT referral to [facility] Out[facility]- [facility] Location (p:[phone]/[phone]).  Cecile Sheerer, MSW, LCSWA Office: [phone] Cell: [phone] Fax: [phone]

## 2022-10-28 NOTE — Progress Notes (Signed)
Physical Therapy Session Note  Patient Details  Name: Nicholas Potts MRN: [id number] Date of Birth: 1961/01/08  Today's Date: 10/28/2022 PT Individual Time: 1000-1110 PT Individual Time Calculation (min): 70 min   Short Term Goals: Week 1:  PT Short Term Goal 1 (Week 1): =LTGs d/t ELOS  Skilled Therapeutic Interventions/Progress Updates:    Chart reviewed and pt agreeable to therapy. Pt received seated EOB with 6/10 c/o pain in back. Session focused on step training and stair navigation to promote safe home access. Pt initiated session with amb of 167ft to therapy gym using S + RW. Pt then completed blocked practice of step up/backwards down with S + RW and VC for safety. Pt then translated practice to step up/backwards down off 3" stair with B rails for multiple rounds of 1 step. Pt then instructed on safe step down facing forward. Pt completed blocked practice of step up onto stool and down off front of stool using CGA + RW and progressing to S + RW.  Pt then discussed need to practice both forward facing and lateral facing stair navigation to support safe access with unilateral rail at pt's home. Pt then practiced lateral step up/down on 3" stair using L rail ascending with CGA + L rail and progressing to sup + L rail. Pt required multiple rests between sets of practice. Session education emphasized need to pay close attention to foot placement for safe stair navigation in lateral position. Pt then amb 134ft with S + RW to return to room. PT, pt, and family also discussed stair options in pt's home to confirm pt has a set of steps (6 steps) with safe and secure handrail. SpO2 checked at end of session and found to be 97%. At end of session, pt was left seated EOB with alarm engaged, nurse call bell and all needs in reach.     Therapy Documentation Precautions:  Precautions Precautions: Fall, Back Precaution Comments: discussed verbally Required Braces or Orthoses: Spinal Brace Spinal  Brace: Thoracolumbosacral orthotic, Applied in sitting position Restrictions Weight Bearing Restrictions: No    Therapy/Group: Individual Therapy  Dionne Milo, PT, DPT 10/28/2022, 11:24 AM

## 2022-10-28 NOTE — Progress Notes (Signed)
Occupational Therapy Session Note  Patient Details  Name: Nicholas Potts MRN: [id number] Date of Birth: 08/28/1960  Today's Date: 10/28/2022 OT Individual Time: 1300-1345 OT Individual Time Calculation (min): 45 min  and Today's Date: 10/28/2022 OT Missed Time: 15 Minutes Missed Time Reason: Pain;Patient fatigue   Short Term Goals: Week 1:  OT Short Term Goal 1 (Week 1): STG=LTG's d/t LOS  Skilled Therapeutic Interventions/Progress Updates:    Pt resting in bed upon arrival. Supine>sit EOB with supervision using bed rails. Pt issued walker bag and elastic shoe laces. Pt amb with RW to main gym with CGA/supervision-105'. Pt amb with RW 171'x2. Pt tossed basketball against trampoline while standing on AirEx-CGA for 3x10. Pt rested before returning to room. Pt remained seated EOB with bed alarm activated. All needs within reach.   Therapy Documentation Precautions:  Precautions Precautions: Fall, Back Precaution Comments: discussed verbally Required Braces or Orthoses: Spinal Brace Spinal Brace: Thoracolumbosacral orthotic, Applied in sitting position Restrictions Weight Bearing Restrictions: No General: General OT Amount of Missed Time: 15 Minutes Pain:  Pt reports 7.5 pain in back with escalation during activity but return to 7.5 with rest; repositioned, rest, and emotional support   Therapy/Group: Individual Therapy  [name] 10/28/2022, 1:58 PM

## 2022-10-28 NOTE — Progress Notes (Signed)
Occupational Therapy Session Note  Patient Details  Name: [name] MRN: [id number] Date of Birth: 1960/09/18  Today's Date: 10/28/2022 OT Individual Time: 0865-7846 OT Individual Time Calculation (min): 70 min    Short Term Goals: Week 1:  OT Short Term Goal 1 (Week 1): STG=LTG's d/t LOS  Skilled Therapeutic Interventions/Progress Updates:    Pt seated EOB upon arrival. Pt requested to use toilet and brush teeth before leaving room. Amb with RW to bathroom with supervision. Toileting with supervision. Pt completed grooming tasks seated at sink. Pt amb with RW to ortho gym and practiced using ramp. Pt amb with RW to ADL apt to practice furniture transfers. Pt amb with RW to gym. Ball tosses against trampoline [phone] with supervision. Pt amb 84'x2, 123'x1, and 140'x1 with rest breaks inbetween. O2 sats >90% on RA. Pain did not escalate. Pt returned to room and remained seated EOB. Bed alarm activated. All needs within reach.   Therapy Documentation Precautions:  Precautions Precautions: Fall, Back Precaution Comments: discussed verbally Required Braces or Orthoses: Spinal Brace Spinal Brace: Thoracolumbosacral orthotic, Applied in sitting position Restrictions Weight Bearing Restrictions: No  Pain: Pt c/o 7.5 back pain at beginning of session but reduced to 7 during session; repositioned and rest breaks   Therapy/Group: Individual Therapy  [name] 10/28/2022, 9:27 AM

## 2022-10-29 DIAGNOSIS — S22070A Wedge compression fracture of T9-T10 vertebra, initial encounter for closed fracture: Secondary | ICD-10-CM | POA: Diagnosis not present

## 2022-10-29 LAB — GLUCOSE, CAPILLARY
Glucose-Capillary: 95 mg/dL (ref 70–99)
Glucose-Capillary: 80 mg/dL (ref 70–99)

## 2022-10-29 NOTE — Progress Notes (Signed)
Occupational Therapy Session Note  Patient Details  Name: Nicholas Potts MRN: [id number] Date of Birth: [birth year]  Today's Date: 10/29/2022 OT Individual Time: 0915-1000 OT Individual Time Calculation (min): 45 min    Short Term Goals: Week 1:  OT Short Term Goal 1 (Week 1): STG=LTG's d/t LOS  Skilled Therapeutic Interventions/Progress Updates:    Pt resting EOB upon arrival. OT intervention with focus on functional amb with RW and standing balance. Pt c/o irritation on incision site. Per nursing, ADB pad applied to incision to provide cushioning. Pt reported relief after application. Standing activity in gym tossing bean bags for corn hole. Pt amb with RW and used reacher to retrieve from floor. CGA for task. Pt amb with RW back to room with supervision. Pt returned to EOB. Bed alarm activated. All needs within reach.   Therapy Documentation Precautions:  Precautions Precautions: Fall, Back Precaution Comments: discussed verbally Required Braces or Orthoses: Spinal Brace Spinal Brace: Thoracolumbosacral orthotic, Applied in sitting position Restrictions Weight Bearing Restrictions: No  Pain:   Therapy/Group: Individual Therapy  [name] 10/29/2022, 12:05 PM

## 2022-10-29 NOTE — Progress Notes (Signed)
Patient places self on home CPAP machine.

## 2022-10-29 NOTE — Progress Notes (Signed)
PROGRESS NOTE   Subjective/Complaints:   Pain 7/10- daughters not in room;  No complaints  LBM small last evening  ROS:  Pt denies SOB, abd pain, CP, N/V/C/D, and vision changes   Except for HPI  Objective:   No results found. No results for input(s): "WBC", "HGB", "HCT", "PLT" in the last 72 hours.  No results for input(s): "NA", "K", "CL", "CO2", "GLUCOSE", "BUN", "CREATININE", "CALCIUM" in the last 72 hours.   Intake/Output Summary (Last 24 hours) at 10/29/2022 0827 Last data filed at 10/29/2022 0745 Gross per 24 hour  Intake 1062 ml  Output --  Net 1062 ml        Physical Exam: Vital Signs Blood pressure (!) 102/44, pulse 79, temperature 98.5 F (36.9 C), resp. rate 15, height 5\' 7"  (1.702 m), weight 120.3 kg, SpO2 98 %.         General: awake, alert, appropriate, sitting up EOB; wearing TLSO; NAD HENT: conjugate gaze; oropharynx moist CV: regular rate and rhythm;  no JVD Pulmonary: CTA B/L; no W/R/R- good air movement- sounds good GI: soft, NT, ND, (+)BS- protuberant Psychiatric: appropriate- bright affect Neurological: Ox3   Musculoskeletal: very tight paraspinals on back entire thoracic area- still tight, but not quite as tight- also wearing TLSO    Cervical back: Neck supple. No tenderness.     Right lower leg: No edema.     Left lower leg: No edema.     Comments: 5-/5 throughout- limited by pain mainly  Skin:    General: Skin is warm and dry.     Comments: Incision well approximated and healing without signs of infection Looks great- did not see today IV L hand IV - looks OK  Neurological:     General: No focal deficit present.     Mental Status: He is alert and oriented to person, place, and time.  Follows commands    Comments: Intact to light touch in all 4 extremities No increased tone No Hoffman's or clonus B/L   Assessment/Plan: 1. Functional deficits which require 3+ hours  per day of interdisciplinary therapy in a comprehensive inpatient rehab setting. Physiatrist is providing close team supervision and 24 hour management of active medical problems listed below. Physiatrist and rehab team continue to assess barriers to discharge/monitor patient progress toward functional and medical goals  Care Tool:  Bathing    Body parts bathed by patient: Right arm, Left arm, Chest, Abdomen, Front perineal area, Right upper leg, Left upper leg (without AE)   Body parts bathed by helper: Buttocks, Right lower leg, Left lower leg, Face     Bathing assist Assist Level: Minimal Assistance - Patient > 75%     Upper Body Dressing/Undressing Upper body dressing   What is the patient wearing?: Pull over shirt, Orthosis    Upper body assist Assist Level: Set up assist    Lower Body Dressing/Undressing Lower body dressing      What is the patient wearing?: Pants     Lower body assist Assist for lower body dressing: Contact Guard/Touching assist     Toileting Toileting    Toileting assist Assist for toileting: Supervision/Verbal cueing  Transfers Chair/bed transfer  Transfers assist     Chair/bed transfer assist level: Contact Guard/Touching assist     Locomotion Ambulation   Ambulation assist      Assist level: Contact Guard/Touching assist Assistive device: Walker-rolling Max distance: 75   Walk 10 feet activity   Assist     Assist level: Contact Guard/Touching assist Assistive device: Walker-rolling   Walk 50 feet activity   Assist Walk 50 feet with 2 turns activity did not occur: Safety/medical concerns  Assist level: Contact Guard/Touching assist Assistive device: Walker-rolling    Walk 150 feet activity   Assist Walk 150 feet activity did not occur: Safety/medical concerns         Walk 10 feet on uneven surface  activity   Assist     Assist level: Contact Guard/Touching assist Assistive device:  Walker-rolling   Wheelchair     Assist Is the patient using a wheelchair?: Yes (transport only for time and energy conservation) Type of Wheelchair: Manual    Wheelchair assist level: Dependent - Patient 0%      Wheelchair 50 feet with 2 turns activity    Assist        Assist Level: Dependent - Patient 0%   Wheelchair 150 feet activity     Assist      Assist Level: Dependent - Patient 0%   Blood pressure (!) 102/44, pulse 79, temperature 98.5 F (36.9 C), resp. rate 15, height 5\' 7"  (1.702 m), weight 120.3 kg, SpO2 98 %.  Medical Problem List and Plan: 1. Functional deficits secondary to thoracic fractures s/p open reduction and instrumentation form T9-T12- not SCI patient luckily.              -patient may  shower -cover incision and take brace off for showering, per surgeon             -ELOS/Goals: 10-12 days supervision   D/c 5/25 Con't CIR PT and OT- daughters not here this AM-doesn't need f/u with me 2.  Antithrombotics: -DVT/anticoagulation:  Pharmaceutical: Lovenox  5/23- will not need to go home on Lovenox             -antiplatelet therapy:    3. Pain Management: Tylenol, Norco, Robaxin as needed- will add Tramadol 50 mg q6 hours- can increase if necessary tomorrow.    5/17- not taking Norco much due to sedation- will increase Tramadol to 100 mg q6 hours due to pain control and less constipation  5/18- pt feels there's an improvement in pain with increase in tramadol- but not a lot of difference on Pain scale- he wants ot continue it  5/19- pain doing worse this AM- he thinks due to therapy last 2 days- advised to ask for Robaxin as needed/muscle relaxant.   5/20 pain controlled today, continue tramadol and PRN robaxin  5/21- reminded pt to ask for robaxin prn and educated about muscle splinting- reason he hurts more after working with therapy more. 5/22- educated pt/daughter that pt will get 7 days of pain meds at d/c, then will need to get pain meds  from surgeon/PCP after that- taking tramadol 100 mg QID- pain 7/10 today- doing slightly better  5/23- con't pain meds- will need to go home on Tramadol- will need to get from Surgeon/PCP-  4. Mood/Behavior/Sleep: LCSW to evaluate and provide emotional support             -antipsychotic agents: n/a   5. Neuropsych/cognition: This patient is capable of making decisions on  his own behalf.   6. Skin/Wound Care: Routine skin care checks             -monitor surgical incision   5/17- incision looks great-  7. Fluids/Electrolytes/Nutrition: Routine Is and Os and follow-up chemistries   8: Open reduction of T10-11 fracture and posteriolateral arthrodesis of T9-03-19-11             -follow-up with Dr. Conchita Paris             -TLSO when out of bed   9: Hyperlipidemia: continue statin   10: OSA: BIPAP titration sleep study 12/14/21 required BIPAP 18/11 with residual minimum O2 sat 87% and mean 91.1%              -has home CPAP machine             -O2 supplementation via  to keep SaO2 >88%   5/17- CO2 33- starting to retain CO2- will reduce O2 to 2L max  5/18- using 1L O2- so CO2 should be better- will recheck Monday- more awake this AM  5/20- Co2 28 today, continue to monitor   5/21- using 1L O2 during day as well-   5/22- weaning O2 - doesn't meet criteria to go home on it  5/23- off O2 this AM 11: Constipation-will give Sorbitol 60cc and if that doesn't work (refuses enema) then will give Mg citrate tomorrow after therapy.    5/17- no results with sorbitol- will order Mg citrate 1/2 bottle after therapy and 1/2 bottle at 8pm if no BM  5/18- 2 large BM's yesterday after 1/2 bottle Mg citrate  5/19- LBM x2 yesterday  5/20  start miralax daily, sorbitol PRN ordered LBM 5/18  5/21- LBM 3 days ago- if no BM by 5pm, will give Sorbitol 30cc  5/22- BM x3 yesterday- feels much better 12. Lethargy  5/18- took Provigil at home 200 mg daily- restarted for pt today.    5/19- doing better with Provigil  in place  5/20 appears awake and alert today    LOS: 7 days A FACE TO FACE EVALUATION WAS PERFORMED  Pranika Finks 10/29/2022, 8:27 AM

## 2022-10-29 NOTE — Progress Notes (Signed)
Occupational Therapy Session Note  Patient Details  Name: Mohmmed Dharia MRN: [id number] Date of Birth: [birth year]  Today's Date: 10/29/2022 OT Individual Time: 1115-1200 OT Individual Time Calculation (min): 45 min    Short Term Goals: Week 1:  OT Short Term Goal 1 (Week 1): STG=LTG's d/t LOS  Skilled Therapeutic Interventions/Progress Updates:    Initial focus on simulated use of toilet aide to hygiene. Pt practiced several techniques but was successful. Pt please with ability to perform hygiene. Pt amb with RW to ortho gym for standing activity at [facility]. 1 min x 2 on floor and 1 min x1 on AirEx. Supervision for all tasks. Pt returned to room and remained seated EOB. Bed alarm activated. Daughter present.   Therapy Documentation Precautions:  Precautions Precautions: Fall, Back Precaution Comments: discussed verbally Required Braces or Orthoses: Spinal Brace Spinal Brace: Thoracolumbosacral orthotic, Applied in sitting position Restrictions Weight Bearing Restrictions: No  Pain:  Pt reports 7/10 pain in back; irritation from earlier session resolved   Therapy/Group: Individual Therapy  [name] 10/29/2022, 12:10 PM

## 2022-10-29 NOTE — Progress Notes (Signed)
Placed patient on home CPAP for the night  

## 2022-10-29 NOTE — Progress Notes (Addendum)
Physical Therapy Session Note  Patient Details  Name: Nicholas Potts MRN: [id number] Date of Birth: 05/16/1961  Today's Date: 10/29/2022 PT Individual Time: 1030-1110, 1415-1520 PT Individual Time Calculation (min): 40 min, 60 min and Today's Date: 10/29/2022 PT Missed Time: 10 Minutes Missed Time Reason: Patient fatigue  Short Term Goals: Week 1:  PT Short Term Goal 1 (Week 1): =LTGs d/t ELOS  Skilled Therapeutic Interventions/Progress Updates:    Session 1: Pt recd seated EOB, reports pain 7/10 and up to 7.5 with mobility, premedicated. Rest and positioning provided as needed. Pt's new RW in room, so therapist adjusted to appropriate height. Session focused on stair navigation and gait for endurance. Pt ambulated with supervision and RW throughout. Pt was able to navigate 3" stairs x 8 using RHR and lateral technique, x 2 bouts with extended seated rest breaks for fatigue management. Pt them ambulated >200 ft, requiring only one standing rest breaks, but reports no SOB. Pt returned to room and remained seated EOB with his daughter present.   Session 2: Pt recd seated EOB, reports pain 7/10 and up to 7.5 with mobility, premedicated. Rest and positioning provided as needed. Pt ambulated with supervision and RW throughout session.  ambulatory transfer to bathroom, supervision for 3/3 toileting tasks. Cues for upright posture and RW proximity. Pt navigated 6" step x 1 with CGA and 1 hand rail using lateral technique. Pt was most limited by pain, but therapist and pt agree he will be able to home steps with increased time and rest breaks for pain management. Discontinued d/t increased pain. Pt then transported to [facility] entrance for community integration and gait training over unlevel surfaces. Pt ambulated 4 bouts of 150-200 ft with RW and supervision, demoed good safety over unlevel surfaces and thresholds. Required seated rest breaks for energy conservation. No SOB noted, only fatigue. Pt then  ambulated back to room, with one seated rest break to demo sitting and pulling chair up to table in atrium. Pt navigated elevator with good safety awareness. Pt returned to room d/t fatigue. Discussed d/c plan and grad day for following day. Pt remained in room with his daughter present and all needs in reach.   Therapy Documentation Precautions:  Precautions Precautions: Fall, Back Precaution Comments: discussed verbally Required Braces or Orthoses: Spinal Brace Spinal Brace: Thoracolumbosacral orthotic, Applied in sitting position Restrictions Weight Bearing Restrictions: No General: PT Amount of Missed Time (min): 10 Minutes PT Missed Treatment Reason: Patient fatigue     Therapy/Group: Individual Therapy  Juluis Rainier 10/29/2022, 3:39 PM

## 2022-10-29 NOTE — Progress Notes (Addendum)
Inpatient Rehabilitation Care Coordinator Discharge Note DC Sat 5/25  Patient Details  Name: Nicholas Potts MRN: [id number] Date of Birth: 1961-01-09   Discharge location: D/c to home with his dtr Nicholas Potts  Length of Stay: 9 days  Discharge activity level: CGA to Supervision  Home/community participation: Limited  Patient response WU:JWJXBJ Literacy - How often do you need to have someone help you when you read instructions, pamphlets, or other written material from your doctor or pharmacy?: Rarely  Patient response YN:WGNFAO Isolation - How often do you feel lonely or isolated from those around you?: Never  Services provided included: MD, RD, PT, SLP, CM, TR, Pharmacy, Neuropsych, SW, RN, OT  Financial Services:  Field seismologist Utilized: [employer]  Choices offered to/list presented to: pt dtr  Follow-up services arranged:  Outpatient, DME    Outpatient Servicies: [facility] location for PT DME : [facility] for RW and TTB    Patient response to transportation need: Is the patient able to respond to transportation needs?: Yes In the past 12 months, has lack of transportation kept you from medical appointments or from getting medications?: No In the past 12 months, has lack of transportation kept you from meetings, work, or from getting things needed for daily living?: No   Patient/Family verbalized understanding of follow-up arrangements:  Yes  Individual responsible for coordination of the follow-up plan: contact pt dtr Nicholas Potts [phone]  Confirmed correct DME delivered: Gretchen Short 10/29/2022    Comments (or additional information):fam edu not needed as dtr had been present.   Summary of Stay    Date/Time Discharge Planning CSW  10/26/22 1110 Pt will d/c to home with his daughter Nicholas Potts whom he lives with- she will be able to assist in the evening as she works during the day. His dtr Nicholas Potts will help  during the day as she has more flexible work schedule. Pt will need to be intermittent on days Nicholas Potts has to go to work and will be home alone for a few hours. SW will confirm there are no barriers to discharge. AAC       Nicholas Potts

## 2022-10-30 ENCOUNTER — Ambulatory Visit: Payer: BC Managed Care – PPO | Admitting: Urology

## 2022-10-30 ENCOUNTER — Other Ambulatory Visit (HOSPITAL_COMMUNITY): Payer: Self-pay

## 2022-10-30 DIAGNOSIS — S22079A Unspecified fracture of T9-T10 vertebra, initial encounter for closed fracture: Secondary | ICD-10-CM | POA: Diagnosis not present

## 2022-10-30 MED ORDER — ACETAMINOPHEN 325 MG PO TABS: 325.0000 mg | ORAL_TABLET | ORAL | Status: AC | PRN

## 2022-10-30 MED ORDER — POLYETHYLENE GLYCOL 3350 17 G PO PACK
17.0000 g | PACK | Freq: Two times a day (BID) | ORAL | Status: DC
  Administered 2022-10-30 (×2): 17 g via ORAL
  Filled 2022-10-30 (×2): qty 1

## 2022-10-30 MED ORDER — POLYETHYLENE GLYCOL 3350 17 G PO PACK: 17.0000 g | PACK | Freq: Two times a day (BID) | ORAL | 0 refills | Status: DC

## 2022-10-30 MED ORDER — TRAMADOL HCL 50 MG PO TABS
100.0000 mg | ORAL_TABLET | Freq: Four times a day (QID) | ORAL | 0 refills | Status: DC
  Filled 2022-10-30: qty 30, 4d supply, fill #0

## 2022-10-30 MED ORDER — SORBITOL 70 % SOLN
30.0000 mL | Freq: Once | Status: AC
  Administered 2022-10-30: 30 mL via ORAL
  Filled 2022-10-30: qty 30

## 2022-10-30 MED ORDER — ATORVASTATIN CALCIUM 10 MG PO TABS
10.0000 mg | ORAL_TABLET | Freq: Every day | ORAL | 0 refills | Status: DC
Start: 2022-10-30 — End: 2024-02-10
  Filled 2022-10-30: qty 30, 30d supply, fill #0

## 2022-10-30 MED ORDER — METHOCARBAMOL 500 MG PO TABS
500.0000 mg | ORAL_TABLET | Freq: Four times a day (QID) | ORAL | 0 refills | Status: DC | PRN
  Filled 2022-10-30: qty 90, 23d supply, fill #0

## 2022-10-30 MED ORDER — MODAFINIL 200 MG PO TABS
200.0000 mg | ORAL_TABLET | Freq: Every day | ORAL | 0 refills | Status: DC
  Filled 2022-10-30: qty 30, 30d supply, fill #0

## 2022-10-30 MED ORDER — DOCUSATE SODIUM 100 MG PO CAPS
100.0000 mg | ORAL_CAPSULE | Freq: Two times a day (BID) | ORAL | 0 refills | Status: DC | PRN
  Filled 2022-10-30: qty 100, 50d supply, fill #0

## 2022-10-30 NOTE — Progress Notes (Signed)
Occupational Therapy Note  Patient Details  Name: Naitik Astacio MRN: [id number] Date of Birth: 11-03-1960  Today's Date: 10/30/2022 OT Missed Time: 30 Minutes Missed Time Reason: Other (comment) (PA revieweing d/c paperwork while daughter present)  Pt missed 30 mins skilled OT services. PA reviewing d/c paperwork while daughter and interpreter present. Pt ready for d/c tomorrow.   Lavone Neri [facility] 10/30/2022, 11:57 AM

## 2022-10-30 NOTE — Progress Notes (Signed)
Inpatient Rehabilitation Discharge Medication Review by a Pharmacist  A complete drug regimen review was completed for this patient to identify any potential clinically significant medication issues.  High Risk Drug Classes Is patient taking? Indication by Medication  Antipsychotic No   Anticoagulant No   Antibiotic No   Opioid Yes Tramadol q6h scheduled - pain  Antiplatelet No   Hypoglycemics/insulin No   Vasoactive Medication No   Chemotherapy No   Other Yes Atorvastatin - hyperlipidemia Miralax - laxative Modafanil - initiation/ alertness  PRNs: Acetaminophen - pain Docusate - constipation Methocarbamol - muscle spasms     Type of Medication Issue Identified Description of Issue Recommendation(s)  Drug Interaction(s) (clinically significant)     Duplicate Therapy     Allergy     No Medication Administration End Date     Incorrect Dose     Additional Drug Therapy Needed     Significant med changes from prior encounter (inform family/care partners about these prior to discharge). Multivitamin stopped. Norco changed to Tramadol. Mirabegron ER discontinued - reported not taking prior to admission.   Other       Clinically significant medication issues were identified that warrant physician communication and completion of prescribed/recommended actions by midnight of the next day:  No  Pharamacist comments: - Would change scheduled Tramadol to prn as able.  Time spent performing this drug regimen review (minutes):  [address]   Dennie Fetters, [place] 10/30/2022 11:52 AM

## 2022-10-30 NOTE — Progress Notes (Signed)
Physical Therapy Discharge Summary  Patient Details  Name: Nicholas Potts MRN: [id number] Date of Birth: 10-Feb-1961  Date of Discharge from PT service:Oct 30, 2022  Today's Date: 10/30/2022       Patient has met 5 of 5 long term goals due to improved activity tolerance, increased strength, and decreased pain.  Patient to discharge at an ambulatory level Modified Independent.   Patient's care partner (daughters) have been present throughout pt's stay and is independent to provide the necessary physical assistance at discharge.  Reasons goals not met: N/A all goals met  Recommendation:  Patient will benefit from ongoing skilled PT services in {setting:3041680} to continue to advance safe functional mobility, address ongoing impairments in ***, and minimize fall risk.  Equipment: {equipment:3041657}  Reasons for discharge: treatment goals met and discharge from hospital  Patient/family agrees with progress made and goals achieved: Yes  PT Discharge Precautions/Restrictions Precautions Precautions: Fall;Back Required Braces or Orthoses: Spinal Brace Spinal Brace: Thoracolumbosacral orthotic;Applied in sitting position Restrictions Weight Bearing Restrictions: No Vital Signs  Pain Pain Assessment Pain Scale: 0-10 Pain Score: 7  Pain Location: Back Pain Orientation: Mid Pain Intervention(s): Medication (See eMAR) Pain Interference Pain Interference Pain Effect on Sleep: 1. Rarely or not at all Pain Interference with Therapy Activities: 2. Occasionally Pain Interference with Day-to-Day Activities: 2. Occasionally Vision/Perception  Vision - History Ability to See in Adequate Light: 0 Adequate Perception Perception: Within Functional Limits Praxis Praxis: Intact  Cognition Overall Cognitive Status: Within Functional Limits for tasks assessed Arousal/Alertness: Awake/alert Orientation Level: Oriented X4 Attention: Sustained Sustained Attention: Appears  intact Memory: Appears intact Awareness: Appears intact Problem Solving: Appears intact Sensation Sensation Light Touch: Appears Intact Hot/Cold: Appears Intact Proprioception: Appears Intact Stereognosis: Appears Intact Coordination Fine Motor Movements are Fluid and Coordinated: Yes Motor  Motor Motor: Other (comment) Motor - Discharge Observations: guarding appearing as LE weakness  Mobility Bed Mobility Bed Mobility: Sit to Supine;Supine to Sit Supine to Sit: Independent Sit to Supine: Independent Transfers Transfers: Sit to Stand;Stand Pivot Transfers Sit to Stand: Independent with assistive device Stand Pivot Transfers: Independent with assistive device Transfer (Assistive device): Rolling walker Locomotion  Gait Ambulation: Yes Gait Assistance: Independent with assistive device Gait Distance (Feet): 150 Feet Assistive device: Rolling walker Gait Gait: Yes Gait Pattern: Within Functional Limits Gait velocity: decreased Stairs / Additional Locomotion Stairs: Yes Stairs Assistance: Supervision/Verbal cueing Stair Management Technique: One rail Right Number of Stairs: 4 Height of Stairs: 6 Pick up small object from the floor assist level: Independent with assistive device Pick up small object from the floor assistive device: reacher Wheelchair Mobility Wheelchair Mobility: No  Trunk/Postural Assessment  Cervical Assessment Cervical Assessment: Within Functional Limits Thoracic Assessment Thoracic Assessment: Exceptions to WFL (TLSO) Lumbar Assessment Lumbar Assessment: Exceptions to WFL (TLSO) Postural Control Postural Control: Within Functional Limits  Balance Balance Balance Assessed: Yes Static Sitting Balance Static Sitting - Balance Support: Feet supported Static Sitting - Level of Assistance: 6: Modified independent (Device/Increase time) Dynamic Sitting Balance Dynamic Sitting - Balance Support: During functional activity Dynamic Sitting -  Level of Assistance: 6: Modified independent (Device/Increase time) Static Standing Balance Static Standing - Balance Support: Bilateral upper extremity supported Static Standing - Level of Assistance: 6: Modified independent (Device/Increase time) Dynamic Standing Balance Dynamic Standing - Balance Support: Bilateral upper extremity supported Dynamic Standing - Level of Assistance: 6: Modified independent (Device/Increase time) Extremity Assessment  RUE Assessment RUE Assessment: Within Functional Limits LUE Assessment LUE Assessment: Within Functional Limits RLE Assessment RLE Assessment:  Within Functional Limits General Strength Comments: improved due to improved pain management LLE Assessment LLE Assessment: Within Functional Limits General Strength Comments: Improved due to improved pain management   Rosita DeChalus 10/30/2022, 12:42 PM

## 2022-10-30 NOTE — Progress Notes (Signed)
PA Dois Davenport in with pt/family to discuss d/c instructions for tomorrow 5/25. Pt and family in agreement. D/C packet given to pt/family. Mylo Red, LPN

## 2022-10-30 NOTE — Progress Notes (Signed)
Physical Therapy Session Note  Patient Details  Name: Branko Allor MRN: [id number] Date of Birth: July 23, 1960  Today's Date: 10/30/2022 PT Individual Time: 1010-1105 and 1500-1540 PT Individual Time Calculation (min): 55 min and 40 min   Short Term Goals: Week 1:  PT Short Term Goal 1 (Week 1): =LTGs d/t ELOS  Skilled Therapeutic Interventions/Progress Updates: Pt presented sitting EOB with dgt and interpreter present agreeable to therapy. Pt states pain 7/10, premedicated with nsg arriving arriving to provide ms relaxant at start of session. Session focused on functional mobility in preparation for d/c. Pt ambulated to ortho gym with supervision and performed car transfer at compact SUV height, mod I. Pt was able to ambulate to gym without rest nor demonstrated any dyspnea with activity. Pt then ambulated to main gym mod I without rest break and took extended seated rest break prior to attempting stairs. PTA reinforced breathing through activity vs holding breath to help with pain management through transitional movement. Pt was then able to ascend/desend x 4 steps with supervision using R rail only to step laterally with step to pattern. Pt stated that no significant increase in pain when performing stairs this session. Pt then ambulated back to room and bed was elevated to home set up. Performed bed mobility without bed features at mod I level. Pt then returned to sitting EOB and left seated with dgt, interpreter and Dois Davenport PA present with needs met.   Tx2: Pt presented at EOB agreeable to therapy. Pt states pain 7/10, premedicated. Session focused on activity tolerance and application of HEP. Pt stood mod I and ambulated to day room mod I with RW and without seated rest break. Pt did pause a moment to allow doors to open but continued without additional rest. SpO2 checked after mobility 95% HR 95. Pt did require increased time for recovery after ambulation. Pt then participated in seated and  standing exercises with PTA explaining activity is to increase endurance. Pt performed Sit to stand without AD, standing hip abd/add, heel rises, standing march, and hamstring stretch. Pt was able to tolerate all activities with intermittent rest breaks. Pt then ambulated back to room at end of session in same manner as prior and returned to sitting EOB with call bell within reach and needs met.   Access Code: [email or web address] URL: [email or web address] Date: 10/30/2022 Prepared by: Cornell Barman [place]  Exercises - Sit to Stand with Armchair  - 1 x daily - 7 x weekly - 3 sets - 10 reps - Standing Hip Abduction with Counter Support  - 1 x daily - 7 x weekly - 3 sets - 10 reps - Standing March with Counter Support  - 1 x daily - 7 x weekly - 3 sets - 10 reps - Heel Raises with Counter Support  - 1 x daily - 7 x weekly - 3 sets - 10 reps - Seated Hamstring Stretch  - 1 x daily - 7 x weekly - 3 sets - 10 reps   Vital Signs: Therapy Vitals Temp: 98.9 F (37.2 C) Temp Source: Oral Pulse Rate: 92 Resp: 18 BP: 130/65 Patient Position (if appropriate): Sitting Oxygen Therapy SpO2: 96 % O2 Device: Room Air Pain:      Therapy/Group: Individual Therapy  Claudett Bayly 10/30/2022, 4:32 PM

## 2022-10-30 NOTE — Progress Notes (Signed)
PROGRESS NOTE   Subjective/Complaints:   Pt reports LBM good BM was 3 days ago- had tiny one last night.   Feeling good overall, but wants to sleep more.  Did 1 step yesterday and needs to go up 5 steps to get into home.     ROS:   Pt denies SOB, abd pain, CP, N/V/C/D, and vision changes  Except for HPI  Objective:   No results found. No results for input(s): "WBC", "HGB", "HCT", "PLT" in the last 72 hours.  No results for input(s): "NA", "K", "CL", "CO2", "GLUCOSE", "BUN", "CREATININE", "CALCIUM" in the last 72 hours.   Intake/Output Summary (Last 24 hours) at 10/30/2022 0752 Last data filed at 10/29/2022 1734 Gross per 24 hour  Intake 476 ml  Output --  Net 476 ml        Physical Exam: Vital Signs Blood pressure (!) 100/59, pulse 78, temperature 98.6 F (37 C), temperature source Oral, resp. rate 18, height 5\' 7"  (1.702 m), weight 120.3 kg, SpO2 98 %.          General: awake, alert, appropriate, wearing CPAP; daughter at bedside; asleep initially ;  NAD HENT: conjugate gaze; oropharynx moist CV: regular rate and rhythm; no JVD Pulmonary: CTA B/L; no W/R/R- good air movement GI: soft, NT, ND, (+)BS- slightly hypoactive- protuberant Psychiatric: appropriate- bright but sleepy Neurological: Ox3  Musculoskeletal: very tight paraspinals on back entire thoracic area- still tight, but not quite as tight- also wearing TLSO    Cervical back: Neck supple. No tenderness.     Right lower leg: No edema.     Left lower leg: No edema.     Comments: 5-/5 throughout- limited by pain mainly  Skin:    General: Skin is warm and dry.     Comments: Incision well approximated and healing without signs of infection Looks great- did not see today IV L hand IV - looks OK  Neurological:     General: No focal deficit present.     Mental Status: He is alert and oriented to person, place, and time.  Follows commands     Comments: Intact to light touch in all 4 extremities No increased tone No Hoffman's or clonus B/L   Assessment/Plan: 1. Functional deficits which require 3+ hours per day of interdisciplinary therapy in a comprehensive inpatient rehab setting. Physiatrist is providing close team supervision and 24 hour management of active medical problems listed below. Physiatrist and rehab team continue to assess barriers to discharge/monitor patient progress toward functional and medical goals  Care Tool:  Bathing    Body parts bathed by patient: Right arm, Left arm, Chest, Abdomen, Front perineal area, Right upper leg, Left upper leg, Buttocks, Right lower leg, Left lower leg, Face   Body parts bathed by helper: Buttocks, Right lower leg, Left lower leg, Face     Bathing assist Assist Level: Independent with assistive device     Upper Body Dressing/Undressing Upper body dressing   What is the patient wearing?: Pull over shirt, Orthosis    Upper body assist Assist Level: Independent with assistive device    Lower Body Dressing/Undressing Lower body dressing  What is the patient wearing?: Pants     Lower body assist Assist for lower body dressing: Independent with assitive device     Toileting Toileting    Toileting assist Assist for toileting: Independent with assistive device     Transfers Chair/bed transfer  Transfers assist     Chair/bed transfer assist level: Independent with assistive device     Locomotion Ambulation   Ambulation assist      Assist level: Contact Guard/Touching assist Assistive device: Walker-rolling Max distance: 75   Walk 10 feet activity   Assist     Assist level: Contact Guard/Touching assist Assistive device: Walker-rolling   Walk 50 feet activity   Assist Walk 50 feet with 2 turns activity did not occur: Safety/medical concerns  Assist level: Contact Guard/Touching assist Assistive device: Walker-rolling    Walk 150  feet activity   Assist Walk 150 feet activity did not occur: Safety/medical concerns         Walk 10 feet on uneven surface  activity   Assist     Assist level: Contact Guard/Touching assist Assistive device: Walker-rolling   Wheelchair     Assist Is the patient using a wheelchair?: Yes (transport only for time and energy conservation) Type of Wheelchair: Manual    Wheelchair assist level: Dependent - Patient 0%      Wheelchair 50 feet with 2 turns activity    Assist        Assist Level: Dependent - Patient 0%   Wheelchair 150 feet activity     Assist      Assist Level: Dependent - Patient 0%   Blood pressure (!) 100/59, pulse 78, temperature 98.6 F (37 C), temperature source Oral, resp. rate 18, height 5\' 7"  (1.702 m), weight 120.3 kg, SpO2 98 %.  Medical Problem List and Plan: 1. Functional deficits secondary to thoracic fractures s/p open reduction and instrumentation form T9-T12- not SCI patient luckily.              -patient may  shower -cover incision and take brace off for showering, per surgeon             -ELOS/Goals: 10-12 days supervision   D/c 5/25 Con't CIR PT and OT D/c tomorrow Doesn't need f/u with me- see PCP in 30 days and f/u with surgeon for meds/f/u 2.  Antithrombotics: -DVT/anticoagulation:  Pharmaceutical: Lovenox  5/23- will not need to go home on Lovenox             -antiplatelet therapy:    3. Pain Management: Tylenol, Norco, Robaxin as needed- will add Tramadol 50 mg q6 hours- can increase if necessary tomorrow.    5/17- not taking Norco much due to sedation- will increase Tramadol to 100 mg q6 hours due to pain control and less constipation  5/18- pt feels there's an improvement in pain with increase in tramadol- but not a lot of difference on Pain scale- he wants ot continue it  5/19- pain doing worse this AM- he thinks due to therapy last 2 days- advised to ask for Robaxin as needed/muscle relaxant.   5/20 pain  controlled today, continue tramadol and PRN robaxin  5/21- reminded pt to ask for robaxin prn and educated about muscle splinting- reason he hurts more after working with therapy more. 5/22- educated pt/daughter that pt will get 7 days of pain meds at d/c, then will need to get pain meds from surgeon/PCP after that- taking tramadol 100 mg QID- pain 7/10 today- doing  slightly better  5/23- con't pain meds- will need to go home on Tramadol- will need to get from Surgeon/PCP-  5/24- won't see me in clinic- so needs to f/u with surgeon for pain meds 4. Mood/Behavior/Sleep: LCSW to evaluate and provide emotional support             -antipsychotic agents: n/a   5. Neuropsych/cognition: This patient is capable of making decisions on his own behalf.   6. Skin/Wound Care: Routine skin care checks             -monitor surgical incision   5/17- incision looks great-  7. Fluids/Electrolytes/Nutrition: Routine Is and Os and follow-up chemistries   8: Open reduction of T10-11 fracture and posteriolateral arthrodesis of T9-03-19-11             -follow-up with Dr. Conchita Paris             -TLSO when out of bed   9: Hyperlipidemia: continue statin   10: OSA: BIPAP titration sleep study 12/14/21 required BIPAP 18/11 with residual minimum O2 sat 87% and mean 91.1%              -has home CPAP machine             -O2 supplementation via [place] to keep SaO2 >88%   5/17- CO2 33- starting to retain CO2- will reduce O2 to 2L max  5/18- using 1L O2- so CO2 should be better- will recheck Monday- more awake this AM  5/20- Co2 28 today, continue to monitor   5/21- using 1L O2 during day as well-   5/22- weaning O2 - doesn't meet criteria to go home on it  5/23- off O2 this AM 11: Constipation-will give Sorbitol 60cc and if that doesn't work (refuses enema) then will give Mg citrate tomorrow after therapy.    5/17- no results with sorbitol- will order Mg citrate 1/2 bottle after therapy and 1/2 bottle at 8pm if no  BM  5/18- 2 large BM's yesterday after 1/2 bottle Mg citrate  5/19- LBM x2 yesterday  5/20  start miralax daily, sorbitol PRN ordered LBM 5/18  5/21- LBM 3 days ago- if no BM by 5pm, will give Sorbitol 30cc  5/22- BM x3 yesterday- feels much better  5/24- will give Sorbitol- has been 3 days since LBM- will increase Miralax to BID and give sorbitol today 12. Lethargy  5/18- took Provigil at home 200 mg daily- restarted for pt today.    5/19- doing better with Provigil in place  5/20 appears awake and alert today     I spent a total of  36  minutes on total care today- >50% coordination of care- due to d/w pt about pain control as well as bowels- also d/w nursing-  Has d/c Saturday  LOS: 8 days A FACE TO FACE EVALUATION WAS PERFORMED  Jeovanni Heuring 10/30/2022, 7:52 AM

## 2022-10-30 NOTE — Progress Notes (Signed)
Occupational Therapy Discharge Summary  Patient Details  Name: Nicholas Potts MRN: [id number] Date of Birth: [birth year]  Date of Discharge from OT service:Oct 30, 2022  Patient has met 12 of 12 long term goals due to improved activity tolerance, improved balance, postural control, and ability to compensate for deficits.  Pt made excellent progreass with BADLs and functional transfers during this admission. Pt is mod I for BADLs and functional tranfsers. Pt independently recalls spinal precautions. Pt and daughter verbalized understanding of home safety recommendations. Pt pleased with progress and ready for d/c home tomorrow.Patient to discharge at overall Modified Independent level.  Patient's care partner is independent to provide the necessary physical assistance at discharge.    Reasons goals not met: n/a  Recommendation:  Patient will benefit from ongoing skilled OT services in home health setting to continue to advance functional skills in the area of BADL and Reduce care partner burden.  Equipment: TTB  Reasons for discharge: treatment goals met and discharge from hospital  Patient/family agrees with progress made and goals achieved: Yes  OT Discharge ADL ADL Equipment Provided: Long-handled sponge, Sock aid, Reacher Eating: Independent Where Assessed-Eating: Edge of bed Grooming: Independent Where Assessed-Grooming: Sitting at sink Upper Body Bathing: Modified independent Where Assessed-Upper Body Bathing: Shower Lower Body Bathing: Modified independent Where Assessed-Lower Body Bathing: Shower Upper Body Dressing: Modified independent (Device) Where Assessed-Upper Body Dressing: Edge of bed Lower Body Dressing: Modified independent Where Assessed-Lower Body Dressing: Edge of bed Toileting: Modified independent Where Assessed-Toileting: Neurosurgeon Method: Proofreader: Financial planner: Modified independent Web designer Method: Ambulating, Sit pivot Tub/Shower Equipment: Insurance underwriter: Modified independent Film/video editor Method: Designer, industrial/product: Emergency planning/management officer ADL Comments: Pt performing UB bathing with min A, LB bathing max A sink level with spinal prec for LE BLT's, UB dressing with min a for TLSO and pull over shirt, max A for shorts and D socks, needs AE trng Vision Baseline Vision/History: 1 Wears glasses Patient Visual Report: No change from baseline Perception  Perception: Within Functional Limits Praxis Praxis: Intact Cognition Cognition Overall Cognitive Status: Within Functional Limits for tasks assessed Arousal/Alertness: Awake/alert Orientation Level: Person;Place;Situation Person: Oriented Place: Oriented Situation: Oriented Memory: Appears intact Attention: Sustained Sustained Attention: Appears intact Awareness: Appears intact Problem Solving: Appears intact Safety/Judgment: Appears intact Brief Interview for Mental Status (BIMS) Repetition of Three Words (First Attempt): 3 Temporal Orientation: Year: Correct Temporal Orientation: Month: Accurate within 5 days Temporal Orientation: Day: Correct Recall: "Sock": Yes, no cue required Recall: "Blue": Yes, no cue required Recall: "Bed": Yes, no cue required BIMS Summary Score: 15 Sensation Sensation Light Touch: Appears Intact Hot/Cold: Appears Intact Proprioception: Appears Intact Stereognosis: Appears Intact Motor  Motor Motor: Other (comment) Motor - Skilled Clinical Observations: gross LE weakness Trunk/Postural Assessment  Cervical Assessment Cervical Assessment: Within Functional Limits Thoracic Assessment Thoracic Assessment: Exceptions to WFL (TLSO) Lumbar Assessment Lumbar Assessment: Exceptions to WFL (TLSO) Postural Control Postural Control: Within Functional Limits  Balance Static Sitting  Balance Static Sitting - Balance Support: Feet supported Static Sitting - Level of Assistance: 6: Modified independent (Device/Increase time) Dynamic Sitting Balance Dynamic Sitting - Balance Support: During functional activity Dynamic Sitting - Level of Assistance: 6: Modified independent (Device/Increase time) Extremity/Trunk Assessment RUE Assessment RUE Assessment: Within Functional Limits LUE Assessment LUE Assessment: Within Functional Limits   [name] 10/30/2022, 6:45 AM

## 2022-10-30 NOTE — Progress Notes (Signed)
Occupational Therapy Session Note  Patient Details  Name: Nicholas Potts MRN: [id number] Date of Birth: [birth year]  Today's Date: 10/30/2022 OT Individual Time: 6578-4696 OT Individual Time Calculation (min): 75 min    Short Term Goals: Week 1:  OT Short Term Goal 1 (Week 1): STG=LTG's d/t LOS  Skilled Therapeutic Interventions/Progress Updates:    OT interventio with focus on BADLs, functional amb with RW, standing balance, safety awareness, activity tolerance, energy conservation education, and home safety. Pt completed bathing/dressing with mod I (see below.) Pt used AE appropriately. Pt commented that he was exhausted after shower. Reviewed energy conservation strategies. Pt recalled spinal precautions independently. Pt dons/doffs TLSO independently. Pt's daughter present during therapy session. Pt remained seated EOB with all needs within reach. Bed alarm activated.   Therapy Documentation Precautions:  Precautions Precautions: Fall, Back Precaution Comments: discussed verbally Required Braces or Orthoses: Spinal Brace Spinal Brace: Thoracolumbosacral orthotic, Applied in sitting position Restrictions Weight Bearing Restrictions: No Pain: Pain Assessment Pain Scale: 0-10 Pain Score: 7  Pain Type: Surgical pain Pain Location: Back Pain Orientation: Mid;Lower Pain Intervention(s): Medication (See eMAR) ADL: ADL Equipment Provided: Long-handled sponge, Sock aid, Reacher Eating: Independent Where Assessed-Eating: Edge of bed Grooming: Independent Where Assessed-Grooming: Sitting at sink Upper Body Bathing: Modified independent Where Assessed-Upper Body Bathing: Shower Lower Body Bathing: Modified independent Where Assessed-Lower Body Bathing: Shower Upper Body Dressing: Modified independent (Device) Where Assessed-Upper Body Dressing: Edge of bed Lower Body Dressing: Modified independent Where Assessed-Lower Body Dressing: Edge of bed Toileting: Modified  independent Where Assessed-Toileting: Teacher, adult education: Engineer, agricultural Method: Proofreader: Engineer, technical sales: Modified independent Web designer Method: Ambulating, Sit pivot Tub/Shower Equipment: Insurance underwriter: Modified independent Film/video editor Method: Designer, industrial/product: Emergency planning/management officer ADL Comments: Pt performing UB bathing with min A, LB bathing max A sink level with spinal prec for LE BLT's, UB dressing with min a for TLSO and pull over shirt, max A for shorts and D socks, needs AE trng Vision Baseline Vision/History: 1 Wears glasses Patient Visual Report: No change from baseline Perception  Perception: Within Functional Limits Praxis Praxis: Intact Balance Static Sitting Balance Static Sitting - Balance Support: Feet supported Static Sitting - Level of Assistance: 6: Modified independent (Device/Increase time) Dynamic Sitting Balance Dynamic Sitting - Balance Support: During functional activity Dynamic Sitting - Level of Assistance: 6: Modified independent (Device/Increase time) Exercises:   Other Treatments:     Therapy/Group: Individual Therapy  Nicholas Potts 10/30/2022, 9:15 AM

## 2022-10-31 DIAGNOSIS — S22002D Unstable burst fracture of unspecified thoracic vertebra, subsequent encounter for fracture with routine healing: Secondary | ICD-10-CM

## 2022-10-31 NOTE — Progress Notes (Signed)
PROGRESS NOTE   Subjective/Complaints: No c/o this am daughter at bedside , she is concerned that pt only received 30 tramadol and should have 56 tbs since he is taking 2 tabs QID   Objective:   No results found. No results for input(s): "WBC", "HGB", "HCT", "PLT" in the last 72 hours.  No results for input(s): "NA", "K", "CL", "CO2", "GLUCOSE", "BUN", "CREATININE", "CALCIUM" in the last 72 hours.   Intake/Output Summary (Last 24 hours) at 10/31/2022 0757 Last data filed at 10/30/2022 1745 Gross per 24 hour  Intake 476 ml  Output --  Net 476 ml         Physical Exam: Vital Signs Blood pressure (!) 97/47, pulse 72, temperature 98.2 F (36.8 C), temperature source Oral, resp. rate 16, height 5\' 7"  (1.702 m), weight 120.3 kg, SpO2 95 %. General: awake, alert, appropriate,  Brushing teeth at sink Mood/affect appropriate    Assessment/Plan: 1. Functional deficits which require 3+ hours per day of interdisciplinary therapy in a comprehensive inpatient rehab setting. Physiatrist is providing close team supervision and 24 hour management of active medical problems listed below. Physiatrist and rehab team continue to assess barriers to discharge/monitor patient progress toward functional and medical goals  Care Tool:  Bathing    Body parts bathed by patient: Right arm, Left arm, Chest, Abdomen, Front perineal area, Right upper leg, Left upper leg, Buttocks, Right lower leg, Left lower leg, Face   Body parts bathed by helper: Buttocks, Right lower leg, Left lower leg, Face     Bathing assist Assist Level: Independent with assistive device     Upper Body Dressing/Undressing Upper body dressing   What is the patient wearing?: Pull over shirt, Orthosis    Upper body assist Assist Level: Independent with assistive device    Lower Body Dressing/Undressing Lower body dressing      What is the patient wearing?:  Pants     Lower body assist Assist for lower body dressing: Independent with assitive device     Toileting Toileting    Toileting assist Assist for toileting: Independent with assistive device     Transfers Chair/bed transfer  Transfers assist     Chair/bed transfer assist level: Independent with assistive device Chair/bed transfer assistive device: Walker, Archivist   Ambulation assist      Assist level: Independent with assistive device Assistive device: Walker-rolling Max distance: 155ft   Walk 10 feet activity   Assist     Assist level: Independent with assistive device Assistive device: Walker-rolling   Walk 50 feet activity   Assist Walk 50 feet with 2 turns activity did not occur: Safety/medical concerns  Assist level: Independent with assistive device Assistive device: Walker-rolling    Walk 150 feet activity   Assist Walk 150 feet activity did not occur: Safety/medical concerns  Assist level: Independent with assistive device Assistive device: Walker-rolling    Walk 10 feet on uneven surface  activity   Assist     Assist level: Supervision/Verbal cueing Assistive device: Walker-rolling   Wheelchair     Assist Is the patient using a wheelchair?: No Type of Wheelchair: Manual    Wheelchair assist  level: Dependent - Patient 0%      Wheelchair 50 feet with 2 turns activity    Assist        Assist Level: Dependent - Patient 0%   Wheelchair 150 feet activity     Assist      Assist Level: Dependent - Patient 0%   Blood pressure (!) 97/47, pulse 72, temperature 98.2 F (36.8 C), temperature source Oral, resp. rate 16, height 5\' 7"  (1.702 m), weight 120.3 kg, SpO2 95 %.  Medical Problem List and Plan: 1. Functional deficits secondary to thoracic fractures s/p open reduction and instrumentation form T9-T12- not SCI patient luckily.              -patient may  shower -cover incision and  take brace off for showering, per surgeon             -ELOS/Goals: 10-12 days supervision   D/c 5/25 Con't CIR PT and OT D/c tomorrow Doesn't need f/u with PMR see PCP in 30 days and f/u with surgeon for meds/f/u 2.  Antithrombotics: -DVT/anticoagulation:  Pharmaceutical: Lovenox  5/23- will not need to go home on Lovenox             -antiplatelet therapy:    3. Pain Management: Tylenol, Norco, Robaxin as needed- will add Tramadol 50 mg q6 hours- can increase if necessary tomorrow.    5/17- not taking Norco much due to sedation- will increase Tramadol to 100 mg q6 hours due to pain control and less constipation  5/18- pt feels there's an improvement in pain with increase in tramadol- but not a lot of difference on Pain scale- he wants ot continue it  5/19- pain doing worse this AM- he thinks due to therapy last 2 days- advised to ask for Robaxin as needed/muscle relaxant.   5/20 pain controlled today, continue tramadol and PRN robaxin  5/21- reminded pt to ask for robaxin prn and educated about muscle splinting- reason he hurts more after working with therapy more. 5/22- educated pt/daughter that pt will get 7 days of pain meds at d/c, then will need to get pain meds from surgeon/PCP after that- taking tramadol 100 mg QID- pain 7/10 today- doing slightly better  5/23- con't pain meds- will need to go home on Tramadol- will need to get from Surgeon/PCP-  5/25- won't see [facility] clinic- so needs to f/u with surgeon for pain meds 4. Mood/Behavior/Sleep: LCSW to evaluate and provide emotional support             -antipsychotic agents: n/a   5. Neuropsych/cognition: This patient is capable of making decisions on his own behalf.   6. Skin/Wound Care: Routine skin care checks             -monitor surgical incision   5/17- incision looks great-  7. Fluids/Electrolytes/Nutrition: Routine Is and Os and follow-up chemistries   8: Open reduction of T10-11 fracture and posteriolateral arthrodesis of  T9-03-19-11             -follow-up with Dr. Conchita Paris             -TLSO when out of bed   9: Hyperlipidemia: continue statin   10: OSA: BIPAP titration sleep study 12/14/21 required BIPAP 18/11 with residual minimum O2 sat 87% and mean 91.1%              -has home CPAP machine             -O2 supplementation via [place]  to keep SaO2 >88%   5/17- CO2 33- starting to retain CO2- will reduce O2 to 2L max  5/18- using 1L O2- so CO2 should be better- will recheck Monday- more awake this AM  5/20- Co2 28 today, continue to monitor   5/21- using 1L O2 during day as well-   5/22- weaning O2 - doesn't meet criteria to go home on it  5/23- off O2 this AM 11: Constipation-will give Sorbitol 60cc and if that doesn't work (refuses enema) then will give Mg citrate tomorrow after therapy.    5/17- no results with sorbitol- will order Mg citrate 1/2 bottle after therapy and 1/2 bottle at 8pm if no BM  5/18- 2 large BM's yesterday after 1/2 bottle Mg citrate  5/19- LBM x2 yesterday  5/20  start miralax daily, sorbitol PRN ordered LBM 5/18  5/21- LBM 3 days ago- if no BM by 5pm, will give Sorbitol 30cc  5/22- BM x3 yesterday- feels much better  5/24- will give Sorbitol- has been 3 days since LBM- will increase Miralax to BID and give sorbitol today 12. Lethargy  5/18- took Provigil at home 200 mg daily- restarted for pt today.    5/19- doing better with Provigil in place  5/20 appears awake and alert today     LOS: 9 days A FACE TO FACE EVALUATION WAS PERFORMED  Erick Colace 10/31/2022, 7:57 AM

## 2022-11-01 NOTE — Progress Notes (Signed)
INPATIENT REHABILITATION DISCHARGE NOTE   Discharge instructions by: Dois Davenport  Verbalized understanding: yes  Skin care/Wound care healing? New dressing applied to back incision and sent supplies home with pt  Pain: robaxin given PTD  IV's: none  Tubes/Drains:none  O2: room air  Safety instructions: completed  Patient belongings: taken with pt  Discharged to: home  Discharged [id number] vehicle  Notes: Tramadol prescription error identified and corrected per MD kirsteins. Additional prescription prescribed per MD

## 2022-11-05 ENCOUNTER — Ambulatory Visit (INDEPENDENT_AMBULATORY_CARE_PROVIDER_SITE_OTHER): Payer: BC Managed Care – PPO | Admitting: Family Medicine

## 2022-11-05 ENCOUNTER — Telehealth: Payer: Self-pay | Admitting: Family Medicine

## 2022-11-05 VITALS — BP 132/76 | HR 81 | Temp 98.4°F | Ht 67.0 in | Wt 265.0 lb

## 2022-11-05 DIAGNOSIS — S24109D Unspecified injury at unspecified level of thoracic spinal cord, subsequent encounter: Secondary | ICD-10-CM

## 2022-11-05 DIAGNOSIS — S22078A Other fracture of T9-T10 vertebra, initial encounter for closed fracture: Secondary | ICD-10-CM

## 2022-11-05 DIAGNOSIS — R42 Dizziness and giddiness: Secondary | ICD-10-CM | POA: Diagnosis not present

## 2022-11-05 DIAGNOSIS — L291 Pruritus scroti: Secondary | ICD-10-CM

## 2022-11-05 DIAGNOSIS — B356 Tinea cruris: Secondary | ICD-10-CM

## 2022-11-05 MED ORDER — CLOTRIMAZOLE 1 % EX CREA
1.0000 | TOPICAL_CREAM | Freq: Two times a day (BID) | CUTANEOUS | 0 refills | Status: DC
Start: 2022-11-05 — End: 2023-12-16

## 2022-11-05 MED ORDER — TRAMADOL HCL 50 MG PO TABS
50.0000 mg | ORAL_TABLET | Freq: Four times a day (QID) | ORAL | 0 refills | Status: AC
Start: 2022-11-05 — End: ?

## 2022-11-05 NOTE — Telephone Encounter (Signed)
Discussed in office at visit today

## 2022-11-05 NOTE — Telephone Encounter (Signed)
Any additional information or comment you would like for me to deliver to this patient and his daughter?

## 2022-11-05 NOTE — Telephone Encounter (Addendum)
   Will call at 8am to check on status of pt. Doesn't show pt went to ED.

## 2022-11-05 NOTE — Patient Instructions (Signed)
Tramadol refilled, 1-2 every 6 hours, discuss further refills with your surgeon next week.  Continue the medications to keep constipation from occurring.  MiraLAX and Colace.  Follow-up if any symptoms worsen.  Make sure to drink plenty of fluids, regular meals, and if lightheadedness or dizziness occurs again, be seen.  Rash on genitals could be a fungal infection, see information below.  Apply antifungal cream twice per day and follow-up if not improving in the next week to 10 days.  Recheck to review chronic medical problems in 1 month, let me know if anything changes sooner.  Hang in there.   Tia inguinal Jock Itch La tia inguinal (tinea cruris) es una infeccin de la piel en la zona de la ingle que es causada por un hongo. La tia inguinal causa una erupcin cutnea que produce picazn en la ingle y la parte superior de los muslos. Por lo general, desaparece en 2 a 3 semanas de tratamiento. Cules son las causas? El hongo que causa la tia inguinal se puede transmitir: Al tocar una infeccin por hongos en otra parte del cuerpo, como pie de atleta, y luego tocar la zona de la ingle. Al compartir toallas o ropa, como medias o zapatos, con alguien que tenga una infeccin por hongos. Qu incrementa el riesgo? La tia inguinal es ms frecuente en los hombres y los varones adolescentes. Tambin es ms probable que desarrolle la afeccin si: Est en un clima caluroso y hmedo. [place] ropa ajustada o trajes de bao mojados durante largos perodos de [place]. Practica deportes. Tiene sobrepeso. Tiene diabetes. Tiene debilitado el sistema de defensa del organismo (sistema inmunitario). Wendall Stade. Cules son los signos o sntomas? Los sntomas de la tia inguinal pueden incluir: Erupcin cutnea roja, rosada o marrn en la zona de la ingle. Puede haber ampollas. La erupcin puede extenderse a los muslos, la abertura entre las nalgas (ano) y las nalgas. Piel seca y escamosa dentro o alrededor  de la erupcin. Picazn. Cmo se diagnostica? En la [employer], el mdico puede hacer el diagnstico observando la erupcin cutnea. En algunos casos, se toma una [place] de piel infectada mediante raspado. Ronna Polio se puede examinar con un microscopio (biopsia) o intentando desarrollar el hongo a partir de la [place] (cultivo). Cmo se trata? El tratamiento de esta afeccin puede incluir lo siguiente: Medicamentos antimicticos para Radio producer. Estos pueden ser Neomia Dear crema, ungento o polvo para la piel, o puede ser un medicamento que se toma por boca (por va oral). Crema o ungento para la piel para reducir Higher education careers adviser. Cambios en el estilo de vida, como el uso de ropa ms holgada y el cuidado de la piel. Siga estas indicaciones en su casa: Cuidado de la piel Apl[place] cremas, ungentos o polvos para la piel exactamente como se lo haya indicado el mdico. Use ropa holgada que no se roce contra la zona de la ingle. Los hombres deben usar calzoncillos o ropa interior [place]. Mantenga la zona de la ingle limpia y seca. Cmbiese la ropa interior [employer]. Cmbiese los trajes de bao mojados lo ms pronto posible. Despus del bao, use una toalla aparte para secar la zona de la ingle con suavidad y en su totalidad. Usar una toalla aparte ayudar a prevenir la propagacin de la infeccin a otras zonas del cuerpo. Evite baos y duchas calientes. El agua caliente puede empeorar la picazn. No se rasque la zona afectada. Indicaciones generales Use y [employer] medicamentos de venta [place]  y los recetados solamente como se lo haya indicado el mdico. No comparta toallas, ropa ni elementos personales con Economist. Lvese las manos frecuentemente con agua y jabn durante al menos 20 segundos, en especial despus de tocarse la zona de la ingle. Use desinfectante para manos si no dispone de [place] y [place]. Cuando est en el gimnasio: Use siempre calzado, especialmente en  la ducha y alrededor de la piscina. Mantenga los cortes cubiertos. Desinfecte cualquier alfombra o equipo antes de usarlos. Dchese inmediatamente despus del entrenamiento. Concurra a todas las visitas de seguimiento. Esto es importante. Comunquese con un mdico si: La erupcin cutnea: Empeora o no mejora luego de 2 semanas de tratamiento. Se extiende. Se vuelve a manifestar una vez finalizado Scientist, research (medical). Tiene alguno de los siguientes sntomas: Grant Ruts. Enrojecimiento, hinchazn o dolor alrededor de la erupcin cutnea, nuevos o empeoramiento de los ya existentes. Lquido, sangre o pus que salen de la erupcin cutnea. Resumen La tia inguinal (tinea cruris) es una infeccin por hongos de la piel en la zona de la ingle. El hongo se puede propagar al compartir ropa o al tocar una infeccin por hongos en otra parte del cuerpo y luego tocar la zona de la ingle. El tratamiento puede incluir medicamentos antifngicos y cambios en el estilo de vida, como mantener la zona limpia y [place]. Esta informacin no tiene Theme park manager el consejo del mdico. Asegrese de hacerle al mdico cualquier pregunta que tenga. Document Revised: 09/10/2020 Document Reviewed: 09/10/2020 Elsevier Patient Education  2024 [employer].

## 2022-11-05 NOTE — Telephone Encounter (Signed)
Called pt's daughter she stated they gave pt (her father) something and started feeling better. They refused ER visit.
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
## 2022-11-08 ENCOUNTER — Encounter: Payer: Self-pay | Admitting: Family Medicine
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
## 2022-11-20 ENCOUNTER — Other Ambulatory Visit: Payer: Self-pay

## 2022-11-20 ENCOUNTER — Ambulatory Visit: Payer: BC Managed Care – PPO | Attending: Physical Medicine and Rehabilitation

## 2022-11-20 DIAGNOSIS — R262 Difficulty in walking, not elsewhere classified: Secondary | ICD-10-CM | POA: Diagnosis present

## 2022-11-20 DIAGNOSIS — M5459 Other low back pain: Secondary | ICD-10-CM | POA: Insufficient documentation

## 2022-11-20 DIAGNOSIS — M6281 Muscle weakness (generalized): Secondary | ICD-10-CM | POA: Insufficient documentation

## 2022-11-20 DIAGNOSIS — M546 Pain in thoracic spine: Secondary | ICD-10-CM | POA: Insufficient documentation

## 2022-11-24 ENCOUNTER — Ambulatory Visit: Payer: BC Managed Care – PPO | Admitting: Physical Therapy

## 2022-11-24 ENCOUNTER — Encounter: Payer: Self-pay | Admitting: Physical Therapy

## 2022-11-24 DIAGNOSIS — M546 Pain in thoracic spine: Secondary | ICD-10-CM

## 2022-11-24 DIAGNOSIS — M5459 Other low back pain: Secondary | ICD-10-CM

## 2022-11-24 DIAGNOSIS — M6281 Muscle weakness (generalized): Secondary | ICD-10-CM
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
## 2022-11-26 ENCOUNTER — Ambulatory Visit: Payer: BC Managed Care – PPO | Admitting: Cardiology

## 2022-11-27 ENCOUNTER — Ambulatory Visit: Payer: BC Managed Care – PPO | Admitting: Physical Therapy
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
## 2022-12-03 ENCOUNTER — Ambulatory Visit: Payer: BC Managed Care – PPO | Admitting: Internal Medicine

## 2022-12-03 ENCOUNTER — Ambulatory Visit: Payer: BC Managed Care – PPO | Admitting: Family Medicine

## 2022-12-03 ENCOUNTER — Ambulatory Visit: Payer: BC Managed Care – PPO

## 2022-12-03 VITALS — BP 106/72 | HR 89 | Ht 67.0 in | Wt 263.0 lb

## 2022-12-03 VITALS — BP 118/74 | HR 89 | Temp 97.9°F | Ht 67.0 in | Wt 263.2 lb

## 2022-12-03 DIAGNOSIS — G4733 Obstructive sleep apnea (adult) (pediatric): Secondary | ICD-10-CM

## 2022-12-03 DIAGNOSIS — E785 Hyperlipidemia, unspecified: Secondary | ICD-10-CM

## 2022-12-03 DIAGNOSIS — M5459 Other low back pain: Secondary | ICD-10-CM

## 2022-12-03 DIAGNOSIS — S22078A Other fracture of T9-T10 vertebra, initial encounter for closed fracture: Secondary | ICD-10-CM | POA: Diagnosis not present

## 2022-12-03 DIAGNOSIS — R7303 Prediabetes: Secondary | ICD-10-CM

## 2022-12-03 DIAGNOSIS — M546 Pain in thoracic spine: Secondary | ICD-10-CM | POA: Diagnosis not present

## 2022-12-03 DIAGNOSIS — G4734 Idiopathic sleep related nonobstructive alveolar hypoventilation: Secondary | ICD-10-CM | POA: Diagnosis not present

## 2022-12-03 DIAGNOSIS — M6281 Muscle weakness (generalized): Secondary | ICD-10-CM

## 2022-12-03 DIAGNOSIS — B356 Tinea cruris: Secondary | ICD-10-CM | POA: Diagnosis not present

## 2022-12-03 MED ORDER — MODAFINIL 200 MG PO TABS: ORAL_TABLET | ORAL | 5 refills | Status: DC

## 2022-12-03 NOTE — Therapy (Signed)
Treatment Note   Patient Name: Nicholas Potts MRN: [id number] DOB:[birth year], 62 y.o., male Today's Date: 11/20/2022  END OF SESSION:  PT End of Session - 12/03/22 1408     Visit Number 3    Number of Visits 21    Date for PT Re-Evaluation 02/13/23    Authorization Type [employer], 30 visit limit PT/OT/ST    Authorization - Visit Number 2    PT Start Time 1400    PT Stop Time 1440    PT Time Calculation (min) 40 min    Activity Tolerance Patient tolerated treatment well    Behavior During Therapy WFL for tasks assessed/performed              Past Medical History:  Diagnosis Date   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HCC) 2014   post op    ARF (acute respiratory failure) (HCC) 2014   Blood dyscrasia    Chronic kidney disease 2014   ARF    Complication of anesthesia 2014   - post op - in combination of pain pills-  Cardiac and Respiratory Arrest- prior to sleep apnea diagonosis   Constipation    CVA (cerebral infarction)    Hyperlipidemia    Hypertension    OSA (obstructive sleep apnea)    Pre-diabetes    Short-term memory loss    Sleep apnea    Stress-induced cardiomyopathy -- RESOLVED    Post-op Narcotic induced Acute Hypoxic respiratory failure with respiratory and PEA cardiac arrest following accidental narcotic overdose; initial [place] 20-20%, now improved to 55%   Was supported with Impella Cardiac Cath - non-obstructive CAD ; 1 month after event - EF up from 25% to 55-60% - stable x 5 yrs   Thrombocytopenia (HCC) 2014   Past Surgical History:  Procedure Laterality Date   ACHILLES TENDON REPAIR     BIOPSY  08/01/2019   Procedure: BIOPSY;  Surgeon: Napoleon Form, MD;  Location: [facility];  Service: Endoscopy;;   BIOPSY  08/05/2020   Procedure: BIOPSY;  Surgeon: Napoleon Form, MD;  Location: [facility];  Service: Endoscopy;;   CARDIAC CATHETERIZATION  12/29/2012   RN LHC --> Impella. for severe shock with low EF. Mild to moderate RV pressure elevation.  Normal coronary arteries. Cardiac Output 2.5, Index 1.46.   COLONOSCOPY WITH PROPOFOL N/A 10/30/2015   Procedure: COLONOSCOPY WITH PROPOFOL;  Surgeon: Napoleon Form, MD;  Location: [facility];  Service: Endoscopy;  Laterality: N/A;   COLONOSCOPY WITH PROPOFOL N/A 08/01/2019   Procedure: COLONOSCOPY WITH PROPOFOL;  Surgeon: Napoleon Form, MD;  Location: [facility];  Service: Endoscopy;  Laterality: N/A;   COLONOSCOPY WITH PROPOFOL N/A 08/05/2020   Procedure: COLONOSCOPY WITH PROPOFOL;  Surgeon: Napoleon Form, MD;  Location: [facility];  Service: Endoscopy;  Laterality: N/A;   INSERTION OF DIALYSIS CATHETER Right 01/09/2013   Procedure: INSERTION OF DIALYSIS CATHETER;  Surgeon: Chuck Hint, MD;  Location: [facility] OR;  Service: Vascular;  Laterality: Right;   LAMINECTOMY WITH POSTERIOR LATERAL ARTHRODESIS LEVEL 3 N/A 10/15/2022   Procedure: THORACIC NINE-THORACIC TWELVE POSTERIOR INSTRUMENTED FUSION REDUCTION OF FRACTURE;  Surgeon: Bedelia Person, MD;  Location: [facility] OR;  Service: Neurosurgery;  Laterality: N/A;   LEFT AND RIGHT HEART CATHETERIZATION WITH CORONARY ANGIOGRAM  12/29/2012   Procedure: LEFT AND RIGHT HEART CATHETERIZATION WITH CORONARY ANGIOGRAM;  Surgeon: Tonny Bollman, MD;  Location: [facility] CATH LAB;  Service: Cardiovascular;;   POLYPECTOMY  08/01/2019   Procedure: POLYPECTOMY;  Surgeon: Napoleon Form, MD;  Location: [facility];  Service: Endoscopy;;   POLYPECTOMY  08/05/2020   Procedure: POLYPECTOMY;  Surgeon: Napoleon Form, MD;  Location: [facility];  Service: Endoscopy;;   TRANSTHORACIC ECHOCARDIOGRAM  12/29/2012   EF 25% with periapical HK/AKA.  --> Followup echo 7/25 -- EF of 35%   TRANSTHORACIC ECHOCARDIOGRAM  01/2013 ; 07/2015   a) EF 55-60%. No regional WMA. Grade 1 diastolic dysfunction. Mild LA dilatation.; b) Normal LV size with mild LV hypertrophy. EF 55-60%. Normal RV   TRANSTHORACIC ECHOCARDIOGRAM  03/2018   EF 55-60%.  NO RWMA.  Normal valves   Patient Active Problem List   Diagnosis Date Noted   Injury of thoracic spine (HCC) 10/22/2022   Dorsal (thoracic) vertebral fracture (HCC) 10/16/2022   Injury 10/15/2022   Urinary urgency 07/10/2022   Urinary frequency 07/10/2022   Morbid obesity with BMI of 40.0-44.9, adult (HCC) 09/10/2021   Fatigue 09/10/2021   History of colonic polyps    Polyp of ascending colon    Polyp of transverse colon    Polyp of cecum    Polyp of descending colon    Polyp of sigmoid colon    Hypersomnia 11/29/2017   Other headache syndrome 05/05/2017   Vertigo 05/05/2017   IFG (impaired fasting glucose) 08/21/2016   Obesity (BMI 30-39.9) 06/23/2013   Stress-induced cardiomyopathy -- essentially resolved    CVA (cerebral infarction)    OSA (obstructive sleep apnea)    Hyperlipidemia    Physical deconditioning 01/24/2013   Tinea pedis 01/18/2013   Essential hypertension 01/13/2013   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   ARF (acute renal failure) with tubular necrosis requiring HD 01/11/2013   Anemia 01/11/2013   Thrombocytopenia, unspecified (HCC) 01/11/2013   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 12/29/2012    PCP: Shade Flood, MD  REFERRING PROVIDER: Genice Rouge, MD   REFERRING DIAG: Unspecified fracture of unspecified thoracic vertebra, initial encounter for closed fracture [S22.009A], Unspecified injury at unspecified level of thoracic spinal cord, initial encounter [S24.109A]   Rationale for Evaluation and Treatment: Rehabilitation  THERAPY DIAG:  No diagnosis found.  ONSET DATE: 10/15/22  SUBJECTIVE:                                                                                                                                                                                           SUBJECTIVE STATEMENT:  Patient reports that he is feeling about the same and has been doing his exercises everyday.    Interpretation provided by pt's daughter today per their preference; interpretation services denied.    PERTINENT HISTORY:  OP Date: 10/15/2022  2 weeks 10/29/2022  4 weeks 11/12/2022  6 weeks 11/26/2022  8 weeks 12/10/2022  10 weeks 12/24/2022  12 weeks 01/07/2023    open reduction of T10-11 fracture and posterior lateral arthrodesis of T9-03-19-11  PMHx includes ARF, CKD, CVA, HTN, OSA, Obesity, Vertigo, Anemia, hx of cardiac and respiratory arrest related to unintentional narcotic overdose   PAIN:  Are you having pain? Yes: NPRS scale: 6-7.5/10 Pain location: along surgical site Pain description: sharp Aggravating factors: none identified at eval  Relieving factors: pain medication  PRECAUTIONS: Other: No lifting more than 1 gallon, no twisting, no bending forward.   WEIGHT BEARING RESTRICTIONS:  lifting restrictions as noted in precautions  FALLS:  Has patient fallen in last 6 months? No  LIVING ENVIRONMENT: Lives with: lives with their family Lives in: House/apartment Stairs: Yes: External: 5 steps; can reach both Has following equipment at home: Dan Humphreys - 2 wheeled  OCCUPATION: lifting/assembling furniture Health visitor)   PLOF: Independent and Independent with basic ADLs  PATIENT GOALS: return to PLOF, including work   NEXT MD VISIT: 11/26/22 to cardiology, 12/03/22 to PCP   OBJECTIVE:   DIAGNOSTIC FINDINGS:  10/15/22 DG THORACOLUMBAR SPINE (post op)  FINDINGS: Pedicle screws are noted at T9, T10, T11 and T12. Posterior fixation elements are not visualized at this time.   IMPRESSION: Pedicle screw placement from T9-T12 for fixation surrounding a distraction injury at T10-T11.  PATIENT SURVEYS:  FOTO 27 current, 49 predicted  SCREENING FOR RED FLAGS: Bowel or bladder incontinence: Yes: present before accident related to CKD  Spinal tumors: No Cauda equina syndrome: No Compression fracture: No Abdominal aneurysm: No  COGNITION: Overall cognitive status:  Within functional limits for tasks assessed      POSTURE: rounded shoulders, forward head, and use of TLSO limited further postural assessment  PALPATION: Not assessed at eval   LUMBAR ROM: not tested at eval d/t post op status   AROM eval  Flexion   Extension   Right lateral flexion   Left lateral flexion   Right rotation   Left rotation    (Blank rows = not tested)  Upper extremity MMT grossly: 4+/5 MMT bilaterally   LOWER EXTREMITY MMT:    MMT Right eval Left eval  Hip flexion 4+ 4+  Hip extension    Hip abduction 4+ 4+  Hip adduction 4 4  Hip internal rotation    Hip external rotation    Knee flexion 5 5  Knee extension 5 5  Ankle dorsiflexion 5 5  Ankle plantarflexion    Ankle inversion    Ankle eversion     (Blank rows = not tested)   FUNCTIONAL TESTS:  6 minute walk test: 425 ft  GAIT: Distance walked: 425 Assistive device utilized: Environmental consultant - 2 wheeled Level of assistance: Modified independence Comments: forward flexed posture, requires cueing for shoulder depression and normal breathing  [facility] Adult PT Treatment:  Therapeutic Exercise:  nu-step L3 44m while taking subjective and planning session with patient Standing in // - significant bil support Standing slow march 20x HS curl - 2x 10 Heel raise - 30x Standing hip abd - x15 ea Hurdle stepping fwd and immediately back - 2x5 ea  Seated Rest breaks between sets  & adjusted TLSO for improved fit  Plan to incorporate seated neutral spine core strengthening during rest breaks.   [facility] Adult PT Treatment:  Therex:  nu-step L5 18m while taking subjective and planning session with patient Walking 185' interspersed with //  exercises x2 Standing in // - significant bil support Standing slow march 14x HS curl - 14x Heel raise - 20x Standing hip abd - 2x10 ea 4'' step - D/C d/t pain Hurdle stepping fwd and immediately back - 2x5 ea     HOME EXERCISE PROGRAM: Access Code: [facility] URL:  [email or web address] Date: 11/24/2022 Prepared by: Alphonzo Severance  Exercises - Seated Transversus Abdominis Bracing  - 3 x daily - 7 x weekly - 1 sets - 5 reps - 3 sec hold - Seated Diaphragmatic Breathing  - 3 x daily - 7 x weekly - Heel Raises with Counter Support  - 5 x daily - 7 x weekly - 2 sets - 10 reps - Standing March with Counter Support  - 5 x daily - 7 x weekly - 2 sets - 5-10 reps - Standing Hip Abduction with Counter Support  - 5 x daily - 7 x weekly - 2 sets - 10 reps  ASSESSMENT:  CLINICAL IMPRESSION: Yader did very well with his exercises today and was able to increase repetitions with standing exercises. He still requires brief seated rest breaks as expected and return to exercises after his pain levels have subsided. We will continue to progress standing exercises for improved activity tolerance. Patient will benefit from addition of seated neutral spine core exercises during seated rest breaks.     OBJECTIVE IMPAIRMENTS: cardiopulmonary status limiting activity, decreased activity tolerance, decreased balance, decreased endurance, decreased mobility, difficulty walking, decreased strength, and pain.   ACTIVITY LIMITATIONS: carrying, lifting, bending, standing, squatting, sleeping, stairs, transfers, bed mobility, bathing, toileting, dressing, hygiene/grooming, and locomotion level  PARTICIPATION LIMITATIONS: meal prep, cleaning, interpersonal relationship, driving, shopping, community activity, occupation, and yard work  PERSONAL FACTORS: Past/current experiences, Profession, and 3+ comorbidities: PMHx includes CKD, CVA, HTN, OSA, Obesity   are also affecting patient's functional outcome.   REHAB POTENTIAL: Fair    CLINICAL DECISION MAKING: Evolving/moderate complexity  EVALUATION COMPLEXITY: Moderate   GOALS: Goals reviewed with patient? Yes  SHORT TERM GOALS: Target date: 12/19/2022  Patient will be independent with initial home program  for core mm activation, and endurance program.  Baseline: provided at eval; to be progressed over next few tx sessions Goal status: INITIAL   LONG TERM GOALS: Target date: 02/13/2023   Patient will report improved overall functional ability with FOTO score of 49 or higher.   Baseline: 27 Goal status: INITIAL  2.  Patient will demonstrate ability to ascend/descend stairs and inclined surfaces without pain or difficulty, using reciprocal pattern.  Goal status: INITIAL  3.  Patient will report that he is able to perform all ADLs and IADLs without assistance.  Goal status: INITIAL  4.  Patient will report ability to safely lifting at least 30# from floor to waist, and waist to counter height in order to ensure safe return to work activities.  Goal status: INITIAL   PLAN:  PT FREQUENCY: 2x/week, decrease freq to 1x/week after first 8 weeks.   PT DURATION: 12 weeks  PLANNED INTERVENTIONS: Therapeutic exercises, Therapeutic activity, Neuromuscular re-education, Balance training, Gait training, Patient/Family education, Self Care, Stair training, Aquatic Therapy, Cryotherapy, Moist heat, Manual therapy, and Re-evaluation.  PLAN FOR NEXT SESSION: review and progress HEP as appropriate, core strengthening, LE/UE endurance, walking tolerance, activity modifications as appropriate   Mauri Reading, PT, DPT 12/03/2022, 4:14 PM
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
## 2022-12-03 NOTE — Patient Instructions (Signed)
No medication changes at this time.  Keep follow-up with your specialist as planned.  If you are not having any muscle spasms I think it would be reasonable to stop the muscle relaxant, but again discuss with your specialist.  If any concerns on labs from today I will let you know.  Take care!

## 2022-12-03 NOTE — Patient Instructions (Addendum)
Order- DME [facility]- please add home O2 2L for sleep blended through his BIPAP. Note on BIPAP titration study O2 was still at 87% on BIPAP 18/11  Script sent increasing modafinil to 400 mg per day. Ok to try 1.5 tabs (300 mg), then 2 tabs (400 mg).

## 2022-12-04 ENCOUNTER — Encounter: Payer: Self-pay | Admitting: Physical Therapy

## 2022-12-04 ENCOUNTER — Other Ambulatory Visit (INDEPENDENT_AMBULATORY_CARE_PROVIDER_SITE_OTHER): Payer: BC Managed Care – PPO

## 2022-12-04 ENCOUNTER — Ambulatory Visit: Payer: BC Managed Care – PPO | Admitting: Physical Therapy

## 2022-12-04 ENCOUNTER — Encounter: Payer: Self-pay | Admitting: Family Medicine

## 2022-12-04 DIAGNOSIS — R7303 Prediabetes: Secondary | ICD-10-CM | POA: Diagnosis not present

## 2022-12-04 DIAGNOSIS — M5459 Other low back pain: Secondary | ICD-10-CM

## 2022-12-04 DIAGNOSIS — E785 Hyperlipidemia, unspecified: Secondary | ICD-10-CM

## 2022-12-04 DIAGNOSIS — M546 Pain in thoracic spine: Secondary | ICD-10-CM | POA: Diagnosis not present

## 2022-12-04 DIAGNOSIS — R262 Difficulty in walking, not elsewhere classified: Secondary | ICD-10-CM

## 2022-12-04 DIAGNOSIS — M6281 Muscle weakness (generalized): Secondary | ICD-10-CM

## 2022-12-04 LAB — COMPREHENSIVE METABOLIC PANEL
ALT: 33 U/L (ref 0–53)
AST: 23 U/L (ref 0–37)
Albumin: 4 g/dL (ref 3.5–5.2)
Alkaline Phosphatase: 96 U/L (ref 39–117)
BUN: 13 mg/dL (ref 6–23)
CO2: 25 mEq/L (ref 19–32)
Calcium: 9.3 mg/dL (ref 8.4–10.5)
Chloride: 107 mEq/L (ref 96–112)
Creatinine, Ser: 0.91 mg/dL (ref 0.40–1.50)
GFR: 89.68 mL/min (ref >60.00)
Glucose, Bld: 94 mg/dL (ref 70–99)
Potassium: 3.9 mEq/L (ref 3.5–5.1)
Sodium: 139 mEq/L (ref 135–145)
Total Bilirubin: 0.5 mg/dL (ref 0.2–1.2)
Total Protein: 6.9 g/dL (ref 6.0–8.3)

## 2022-12-04 LAB — LIPID PANEL
Cholesterol: 142 mg/dL (ref 0–200)
HDL: 31.2 mg/dL — ABNORMAL LOW (ref >39.00)
LDL Cholesterol: 79 mg/dL (ref 0–99)
NonHDL: 111.25
Total CHOL/HDL Ratio: 5
Triglycerides: 159 mg/dL — ABNORMAL HIGH (ref 0.0–149.0)
VLDL: 31.8 mg/dL (ref 0.0–40.0)

## 2022-12-04 LAB — HEMOGLOBIN A1C: Hgb A1c MFr Bld: 5.9 % (ref 4.6–6.5)

## 2022-12-04 NOTE — Therapy (Signed)
Treatment Note   Patient Name: Nicholas Potts MRN: [id number] DOB:Oct 10, 1960, 62 y.o., male Today's Date: 11/20/2022  END OF SESSION:  PT End of Session - 12/04/22 0918     Visit Number 4    Number of Visits 21    Date for PT Re-Evaluation 02/13/23    Authorization Type [employer], 30 visit limit PT/OT/ST    PT Start Time 0916    PT Stop Time 0957    PT Time Calculation (min) 41 min    Activity Tolerance Patient tolerated treatment well    Behavior During Therapy [facility] for tasks assessed/performed              Past Medical History:  Diagnosis Date   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HCC) 2014   post op    ARF (acute respiratory failure) (HCC) 2014   Blood dyscrasia    Chronic kidney disease 2014   ARF    Complication of anesthesia 2014   - post op - in combination of pain pills-  Cardiac and Respiratory Arrest- prior to sleep apnea diagonosis   Constipation    CVA (cerebral infarction)    Hyperlipidemia    Hypertension    OSA (obstructive sleep apnea)    Pre-diabetes    Short-term memory loss    Sleep apnea    Stress-induced cardiomyopathy -- RESOLVED    Post-op Narcotic induced Acute Hypoxic respiratory failure with respiratory and PEA cardiac arrest following accidental narcotic overdose; initial [place] 20-20%, now improved to 55%   Was supported with Impella Cardiac Cath - non-obstructive CAD ; 1 month after event - EF up from 25% to 55-60% - stable x 5 yrs   Thrombocytopenia (HCC) 2014   Past Surgical History:  Procedure Laterality Date   ACHILLES TENDON REPAIR     BIOPSY  08/01/2019   Procedure: BIOPSY;  Surgeon: Napoleon Form, MD;  Location: [facility];  Service: Endoscopy;;   BIOPSY  08/05/2020   Procedure: BIOPSY;  Surgeon: Napoleon Form, MD;  Location: [facility];  Service: Endoscopy;;   CARDIAC CATHETERIZATION  12/29/2012   RN LHC --> Impella. for severe shock with low EF. Mild to moderate RV pressure elevation. Normal coronary arteries. Cardiac  Output 2.5, Index 1.46.   COLONOSCOPY WITH PROPOFOL N/A 10/30/2015   Procedure: COLONOSCOPY WITH PROPOFOL;  Surgeon: Napoleon Form, MD;  Location: [facility];  Service: Endoscopy;  Laterality: N/A;   COLONOSCOPY WITH PROPOFOL N/A 08/01/2019   Procedure: COLONOSCOPY WITH PROPOFOL;  Surgeon: Napoleon Form, MD;  Location: [facility];  Service: Endoscopy;  Laterality: N/A;   COLONOSCOPY WITH PROPOFOL N/A 08/05/2020   Procedure: COLONOSCOPY WITH PROPOFOL;  Surgeon: Napoleon Form, MD;  Location: [facility];  Service: Endoscopy;  Laterality: N/A;   INSERTION OF DIALYSIS CATHETER Right 01/09/2013   Procedure: INSERTION OF DIALYSIS CATHETER;  Surgeon: Chuck Hint, MD;  Location: [facility] OR;  Service: Vascular;  Laterality: Right;   LAMINECTOMY WITH POSTERIOR LATERAL ARTHRODESIS LEVEL 3 N/A 10/15/2022   Procedure: THORACIC NINE-THORACIC TWELVE POSTERIOR INSTRUMENTED FUSION REDUCTION OF FRACTURE;  Surgeon: Bedelia Person, MD;  Location: [facility] OR;  Service: Neurosurgery;  Laterality: N/A;   LEFT AND RIGHT HEART CATHETERIZATION WITH CORONARY ANGIOGRAM  12/29/2012   Procedure: LEFT AND RIGHT HEART CATHETERIZATION WITH CORONARY ANGIOGRAM;  Surgeon: Tonny Bollman, MD;  Location: [facility] CATH LAB;  Service: Cardiovascular;;   POLYPECTOMY  08/01/2019   Procedure: POLYPECTOMY;  Surgeon: Napoleon Form, MD;  Location: [facility];  Service: Endoscopy;;  POLYPECTOMY  08/05/2020   Procedure: POLYPECTOMY;  Surgeon: Napoleon Form, MD;  Location: [facility];  Service: Endoscopy;;   TRANSTHORACIC ECHOCARDIOGRAM  12/29/2012   EF 25% with periapical HK/AKA.  --> Followup echo 7/25 -- EF of 35%   TRANSTHORACIC ECHOCARDIOGRAM  01/2013 ; 07/2015   a) EF 55-60%. No regional WMA. Grade 1 diastolic dysfunction. Mild LA dilatation.; b) Normal LV size with mild LV hypertrophy. EF 55-60%. Normal RV   TRANSTHORACIC ECHOCARDIOGRAM  03/2018   EF 55-60%.  NO RWMA. Normal valves   Patient Active  Problem List   Diagnosis Date Noted   Injury of thoracic spine (HCC) 10/22/2022   Dorsal (thoracic) vertebral fracture (HCC) 10/16/2022   Injury 10/15/2022   Urinary urgency 07/10/2022   Urinary frequency 07/10/2022   Morbid obesity with BMI of 40.0-44.9, adult (HCC) 09/10/2021   Fatigue 09/10/2021   History of colonic polyps    Polyp of ascending colon    Polyp of transverse colon    Polyp of cecum    Polyp of descending colon    Polyp of sigmoid colon    Hypersomnia 11/29/2017   Other headache syndrome 05/05/2017   Vertigo 05/05/2017   IFG (impaired fasting glucose) 08/21/2016   Obesity (BMI 30-39.9) 06/23/2013   Stress-induced cardiomyopathy -- essentially resolved    CVA (cerebral infarction)    OSA (obstructive sleep apnea)    Hyperlipidemia    Physical deconditioning 01/24/2013   Tinea pedis 01/18/2013   Essential hypertension 01/13/2013   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   ARF (acute renal failure) with tubular necrosis requiring HD 01/11/2013   Anemia 01/11/2013   Thrombocytopenia, unspecified (HCC) 01/11/2013   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 12/29/2012    PCP: Shade Flood, MD  REFERRING PROVIDER: Genice Rouge, MD   REFERRING DIAG: Unspecified fracture of unspecified thoracic vertebra, initial encounter for closed fracture [S22.009A], Unspecified injury at unspecified level of thoracic spinal cord, initial encounter [S24.109A]   Rationale for Evaluation and Treatment: Rehabilitation  THERAPY DIAG:  Pain in thoracic spine  Other low back pain  Muscle weakness (generalized)  Difficulty in walking, not elsewhere classified  ONSET DATE: 10/15/22  SUBJECTIVE:                                                                                                                                                                                           SUBJECTIVE STATEMENT:  Pt reports the has  had no change in his pain.  It remains ~7/10  Interpretation provided by pt's daughter today per their preference; interpretation services denied.  PERTINENT HISTORY:   OP Date: 10/15/2022  2 weeks 10/29/2022  4 weeks 11/12/2022  6 weeks 11/26/2022  8 weeks 12/10/2022  10 weeks 12/24/2022  12 weeks 01/07/2023    open reduction of T10-11 fracture and posterior lateral arthrodesis of T9-03-19-11  PMHx includes ARF, CKD, CVA, HTN, OSA, Obesity, Vertigo, Anemia, hx of cardiac and respiratory arrest related to unintentional narcotic overdose   PAIN:  Are you having pain? Yes: NPRS scale: 6-7.5/10 Pain location: along surgical site Pain description: sharp Aggravating factors: none identified at eval  Relieving factors: pain medication  PRECAUTIONS: Other: No lifting more than 1 gallon, no twisting, no bending forward.   WEIGHT BEARING RESTRICTIONS:  lifting restrictions as noted in precautions  FALLS:  Has patient fallen in last 6 months? No  LIVING ENVIRONMENT: Lives with: lives with their family Lives in: House/apartment Stairs: Yes: External: 5 steps; can reach both Has following equipment at home: Dan Humphreys - 2 wheeled  OCCUPATION: lifting/assembling furniture Health visitor)   PLOF: Independent and Independent with basic ADLs  PATIENT GOALS: return to PLOF, including work   NEXT MD VISIT: 11/26/22 to cardiology, 12/03/22 to PCP   OBJECTIVE:   DIAGNOSTIC FINDINGS:  10/15/22 DG THORACOLUMBAR SPINE (post op)  FINDINGS: Pedicle screws are noted at T9, T10, T11 and T12. Posterior fixation elements are not visualized at this time.   IMPRESSION: Pedicle screw placement from T9-T12 for fixation surrounding a distraction injury at T10-T11.  PATIENT SURVEYS:  FOTO 27 current, 49 predicted  SCREENING FOR RED FLAGS: Bowel or bladder incontinence: Yes: present before accident related to CKD  Spinal tumors: No Cauda equina syndrome: No Compression fracture: No Abdominal aneurysm:  No  COGNITION: Overall cognitive status: Within functional limits for tasks assessed      POSTURE: rounded shoulders, forward head, and use of TLSO limited further postural assessment  PALPATION: Not assessed at eval   LUMBAR ROM: not tested at eval d/t post op status   AROM eval  Flexion   Extension   Right lateral flexion   Left lateral flexion   Right rotation   Left rotation    (Blank rows = not tested)  Upper extremity MMT grossly: 4+/5 MMT bilaterally   LOWER EXTREMITY MMT:    MMT Right eval Left eval  Hip flexion 4+ 4+  Hip extension    Hip abduction 4+ 4+  Hip adduction 4 4  Hip internal rotation    Hip external rotation    Knee flexion 5 5  Knee extension 5 5  Ankle dorsiflexion 5 5  Ankle plantarflexion    Ankle inversion    Ankle eversion     (Blank rows = not tested)   FUNCTIONAL TESTS:  6 minute walk test: 425 ft  GAIT: Distance walked: 425 Assistive device utilized: Environmental consultant - 2 wheeled Level of assistance: Modified independence Comments: forward flexed posture, requires cueing for shoulder depression and normal breathing  [facility] Adult PT Treatment:  Therapeutic Exercise:  nu-step L3 80m while taking subjective and planning session with patient Seated abdominal bracing - 5'' hold x10 Seated gentle YTB row with abdominal bracing- 3x10 Standing in // - significant bil support Mini-knee bends - 10x Standing slow march 20x HS curl - 2x 10 Heel raise - 30x Standing hip abd - x15 ea Lateral walking in // Hurdle stepping 8x fwd, alternating  Seated Rest breaks between sets  & adjusted TLSO for improved fit  Plan to incorporate seated neutral spine core strengthening during rest breaks.  [facility] Adult PT Treatment:  Therex:  nu-step L5 46m while taking subjective and planning session with patient Walking 185' interspersed with // exercises x2 Standing in // - significant bil support Standing slow march 14x HS curl - 14x Heel raise -  20x Standing hip abd - 2x10 ea 4'' step - D/C d/t pain Hurdle stepping fwd and immediately back - 2x5 ea     HOME EXERCISE PROGRAM: Access Code: [facility] URL: [email or web address] Date: 11/24/2022 Prepared by: Alphonzo Severance  Exercises - Seated Transversus Abdominis Bracing  - 3 x daily - 7 x weekly - 1 sets - 5 reps - 3 sec hold - Seated Diaphragmatic Breathing  - 3 x daily - 7 x weekly - Heel Raises with Counter Support  - 5 x daily - 7 x weekly - 2 sets - 10 reps - Standing March with Counter Support  - 5 x daily - 7 x weekly - 2 sets - 5-10 reps - Standing Hip Abduction with Counter Support  - 5 x daily - 7 x weekly - 2 sets - 10 reps  ASSESSMENT:  CLINICAL IMPRESSION: [place] tolerated session fair with no adverse reaction.  He continues to endorse pain throughout exercise, but this does return to baseline with brief rest.  Added in light rows with core bracing and abdominal bracing and sitting which was tolerated with minimal increase in pain.    OBJECTIVE IMPAIRMENTS: cardiopulmonary status limiting activity, decreased activity tolerance, decreased balance, decreased endurance, decreased mobility, difficulty walking, decreased strength, and pain.   ACTIVITY LIMITATIONS: carrying, lifting, bending, standing, squatting, sleeping, stairs, transfers, bed mobility, bathing, toileting, dressing, hygiene/grooming, and locomotion level  PARTICIPATION LIMITATIONS: meal prep, cleaning, interpersonal relationship, driving, shopping, community activity, occupation, and yard work  PERSONAL FACTORS: Past/current experiences, Profession, and 3+ comorbidities: PMHx includes CKD, CVA, HTN, OSA, Obesity   are also affecting patient's functional outcome.   REHAB POTENTIAL: Fair    CLINICAL DECISION MAKING: Evolving/moderate complexity  EVALUATION COMPLEXITY: Moderate   GOALS: Goals reviewed with patient? Yes  SHORT TERM GOALS: Target date: 12/19/2022  Patient will  be independent with initial home program for core mm activation, and endurance program.  Baseline: provided at eval; to be progressed over next few tx sessions Goal status: INITIAL   LONG TERM GOALS: Target date: 02/13/2023   Patient will report improved overall functional ability with FOTO score of 49 or higher.   Baseline: 27 Goal status: INITIAL  2.  Patient will demonstrate ability to ascend/descend stairs and inclined surfaces without pain or difficulty, using reciprocal pattern.  Goal status: INITIAL  3.  Patient will report that he is able to perform all ADLs and IADLs without assistance.  Goal status: INITIAL  4.  Patient will report ability to safely lifting at least 30# from floor to waist, and waist to counter height in order to ensure safe return to work activities.  Goal status: INITIAL   PLAN:  PT FREQUENCY: 2x/week, decrease freq to 1x/week after first 8 weeks.   PT DURATION: 12 weeks  PLANNED INTERVENTIONS: Therapeutic exercises, Therapeutic activity, Neuromuscular re-education, Balance training, Gait training, Patient/Family education, Self Care, Stair training, Aquatic Therapy, Cryotherapy, Moist heat, Manual therapy, and Re-evaluation.  PLAN FOR NEXT SESSION: review and progress HEP as appropriate, core strengthening, LE/UE endurance, walking tolerance, activity modifications as appropriate   Fredderick Phenix, PT, DPT 12/04/2022, 10:27 AM

## 2022-12-08 ENCOUNTER — Ambulatory Visit: Payer: BC Managed Care – PPO | Attending: Physical Medicine and Rehabilitation

## 2022-12-08 DIAGNOSIS — S22078A Other fracture of T9-T10 vertebra, initial encounter for closed fracture: Secondary | ICD-10-CM | POA: Insufficient documentation

## 2022-12-08 DIAGNOSIS — M6281 Muscle weakness (generalized): Secondary | ICD-10-CM | POA: Diagnosis present

## 2022-12-08 DIAGNOSIS — M5459 Other low back pain: Secondary | ICD-10-CM

## 2022-12-08 DIAGNOSIS — R262 Difficulty in walking, not elsewhere classified: Secondary | ICD-10-CM

## 2022-12-08 DIAGNOSIS — M546 Pain in thoracic spine: Secondary | ICD-10-CM | POA: Diagnosis present

## 2022-12-08 NOTE — Therapy (Signed)
Treatment Note   Patient Name: Nicholas Potts MRN: [id number] DOB:1960/08/19, 62 y.o., male Today's Date: 12/08/2022   END OF SESSION:  PT End of Session - 12/08/22 1125     Visit Number 5    Number of Visits 21    Date for PT Re-Evaluation 02/13/23    Authorization Type [employer], 30 visit limit PT/OT/ST    PT Start Time 1130    PT Stop Time 1210    PT Time Calculation (min) 40 min    Activity Tolerance Patient tolerated treatment well    Behavior During Therapy WFL for tasks assessed/performed               Past Medical History:  Diagnosis Date   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HCC) 2014   post op    ARF (acute respiratory failure) (HCC) 2014   Blood dyscrasia    Chronic kidney disease 2014   ARF    Complication of anesthesia 2014   - post op - in combination of pain pills-  Cardiac and Respiratory Arrest- prior to sleep apnea diagonosis   Constipation    CVA (cerebral infarction)    Hyperlipidemia    Hypertension    OSA (obstructive sleep apnea)    Pre-diabetes    Short-term memory loss    Sleep apnea    Stress-induced cardiomyopathy -- RESOLVED    Post-op Narcotic induced Acute Hypoxic respiratory failure with respiratory and PEA cardiac arrest following accidental narcotic overdose; initial [place] 20-20%, now improved to 55%   Was supported with Impella Cardiac Cath - non-obstructive CAD ; 1 month after event - EF up from 25% to 55-60% - stable x 5 yrs   Thrombocytopenia (HCC) 2014   Past Surgical History:  Procedure Laterality Date   ACHILLES TENDON REPAIR     BIOPSY  08/01/2019   Procedure: BIOPSY;  Surgeon: Napoleon Form, MD;  Location: [facility];  Service: Endoscopy;;   BIOPSY  08/05/2020   Procedure: BIOPSY;  Surgeon: Napoleon Form, MD;  Location: [facility];  Service: Endoscopy;;   CARDIAC CATHETERIZATION  12/29/2012   RN LHC --> Impella. for severe shock with low EF. Mild to moderate RV pressure elevation. Normal coronary arteries.  Cardiac Output 2.5, Index 1.46.   COLONOSCOPY WITH PROPOFOL N/A 10/30/2015   Procedure: COLONOSCOPY WITH PROPOFOL;  Surgeon: Napoleon Form, MD;  Location: [facility];  Service: Endoscopy;  Laterality: N/A;   COLONOSCOPY WITH PROPOFOL N/A 08/01/2019   Procedure: COLONOSCOPY WITH PROPOFOL;  Surgeon: Napoleon Form, MD;  Location: [facility];  Service: Endoscopy;  Laterality: N/A;   COLONOSCOPY WITH PROPOFOL N/A 08/05/2020   Procedure: COLONOSCOPY WITH PROPOFOL;  Surgeon: Napoleon Form, MD;  Location: [facility];  Service: Endoscopy;  Laterality: N/A;   INSERTION OF DIALYSIS CATHETER Right 01/09/2013   Procedure: INSERTION OF DIALYSIS CATHETER;  Surgeon: Chuck Hint, MD;  Location: [facility] OR;  Service: Vascular;  Laterality: Right;   LAMINECTOMY WITH POSTERIOR LATERAL ARTHRODESIS LEVEL 3 N/A 10/15/2022   Procedure: THORACIC NINE-THORACIC TWELVE POSTERIOR INSTRUMENTED FUSION REDUCTION OF FRACTURE;  Surgeon: Bedelia Person, MD;  Location: [facility] OR;  Service: Neurosurgery;  Laterality: N/A;   LEFT AND RIGHT HEART CATHETERIZATION WITH CORONARY ANGIOGRAM  12/29/2012   Procedure: LEFT AND RIGHT HEART CATHETERIZATION WITH CORONARY ANGIOGRAM;  Surgeon: Tonny Bollman, MD;  Location: [facility] CATH LAB;  Service: Cardiovascular;;   POLYPECTOMY  08/01/2019   Procedure: POLYPECTOMY;  Surgeon: Napoleon Form, MD;  Location: [facility];  Service: Endoscopy;;  POLYPECTOMY  08/05/2020   Procedure: POLYPECTOMY;  Surgeon: Napoleon Form, MD;  Location: [facility];  Service: Endoscopy;;   TRANSTHORACIC ECHOCARDIOGRAM  12/29/2012   EF 25% with periapical HK/AKA.  --> Followup echo 7/25 -- EF of 35%   TRANSTHORACIC ECHOCARDIOGRAM  01/2013 ; 07/2015   a) EF 55-60%. No regional WMA. Grade 1 diastolic dysfunction. Mild LA dilatation.; b) Normal LV size with mild LV hypertrophy. EF 55-60%. Normal RV   TRANSTHORACIC ECHOCARDIOGRAM  03/2018   EF 55-60%.  NO RWMA. Normal valves   Patient  Active Problem List   Diagnosis Date Noted   Injury of thoracic spine (HCC) 10/22/2022   Dorsal (thoracic) vertebral fracture (HCC) 10/16/2022   Injury 10/15/2022   Urinary urgency 07/10/2022   Urinary frequency 07/10/2022   Morbid obesity with BMI of 40.0-44.9, adult (HCC) 09/10/2021   Fatigue 09/10/2021   History of colonic polyps    Polyp of ascending colon    Polyp of transverse colon    Polyp of cecum    Polyp of descending colon    Polyp of sigmoid colon    Hypersomnia 11/29/2017   Other headache syndrome 05/05/2017   Vertigo 05/05/2017   IFG (impaired fasting glucose) 08/21/2016   Obesity (BMI 30-39.9) 06/23/2013   Stress-induced cardiomyopathy -- essentially resolved    CVA (cerebral infarction)    OSA (obstructive sleep apnea)    Hyperlipidemia    Physical deconditioning 01/24/2013   Tinea pedis 01/18/2013   Essential hypertension 01/13/2013   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   ARF (acute renal failure) with tubular necrosis requiring HD 01/11/2013   Anemia 01/11/2013   Thrombocytopenia, unspecified (HCC) 01/11/2013   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 12/29/2012    PCP: Shade Flood, MD  REFERRING PROVIDER: Genice Rouge, MD   REFERRING DIAG: Unspecified fracture of unspecified thoracic vertebra, initial encounter for closed fracture [S22.009A], Unspecified injury at unspecified level of thoracic spinal cord, initial encounter [S24.109A]   Rationale for Evaluation and Treatment: Rehabilitation  THERAPY DIAG:  Pain in thoracic spine  Other low back pain  Muscle weakness (generalized)  Difficulty in walking, not elsewhere classified  ONSET DATE: 10/15/22  SUBJECTIVE:                                                                                                                                                                                           SUBJECTIVE STATEMENT:  Patient  reporting 7/10 pain today, and daughter adds "He sat for about 3 hours at an event over the weekend. He is feeling a little discouraged because he  wanted to start working at the end of August."   Interpretation provided by pt's daughter today per their preference; interpretation services denied.    PERTINENT HISTORY:   OP Date: 10/15/2022  2 weeks 10/29/2022  4 weeks 11/12/2022  6 weeks 11/26/2022  8 weeks 12/10/2022  10 weeks 12/24/2022  12 weeks 01/07/2023    open reduction of T10-11 fracture and posterior lateral arthrodesis of T9-03-19-11  PMHx includes ARF, CKD, CVA, HTN, OSA, Obesity, Vertigo, Anemia, hx of cardiac and respiratory arrest related to unintentional narcotic overdose   PAIN:  Are you having pain? Yes: NPRS scale: 6-7.5/10 Pain location: along surgical site Pain description: sharp Aggravating factors: none identified at eval  Relieving factors: pain medication  PRECAUTIONS: Other: No lifting more than 1 gallon, no twisting, no bending forward.   WEIGHT BEARING RESTRICTIONS:  lifting restrictions as noted in precautions  FALLS:  Has patient fallen in last 6 months? No  LIVING ENVIRONMENT: Lives with: lives with their family Lives in: House/apartment Stairs: Yes: External: 5 steps; can reach both Has following equipment at home: Dan Humphreys - 2 wheeled  OCCUPATION: lifting/assembling furniture Health visitor)   PLOF: Independent and Independent with basic ADLs  PATIENT GOALS: return to PLOF, including work   NEXT MD VISIT: 11/26/22 to cardiology, 12/03/22 to PCP   OBJECTIVE:   DIAGNOSTIC FINDINGS:  10/15/22 DG THORACOLUMBAR SPINE (post op)  FINDINGS: Pedicle screws are noted at T9, T10, T11 and T12. Posterior fixation elements are not visualized at this time.   IMPRESSION: Pedicle screw placement from T9-T12 for fixation surrounding a distraction injury at T10-T11.  PATIENT SURVEYS:  FOTO 27 current, 49 predicted  SCREENING FOR RED FLAGS: Bowel or bladder  incontinence: Yes: present before accident related to CKD  Spinal tumors: No Cauda equina syndrome: No Compression fracture: No Abdominal aneurysm: No  COGNITION: Overall cognitive status: Within functional limits for tasks assessed      POSTURE: rounded shoulders, forward head, and use of TLSO limited further postural assessment  PALPATION: Not assessed at eval   LUMBAR ROM: not tested at eval d/t post op status   AROM eval  Flexion   Extension   Right lateral flexion   Left lateral flexion   Right rotation   Left rotation    (Blank rows = not tested)  Upper extremity MMT grossly: 4+/5 MMT bilaterally   LOWER EXTREMITY MMT:    MMT Right eval Left eval  Hip flexion 4+ 4+  Hip extension    Hip abduction 4+ 4+  Hip adduction 4 4  Hip internal rotation    Hip external rotation    Knee flexion 5 5  Knee extension 5 5  Ankle dorsiflexion 5 5  Ankle plantarflexion    Ankle inversion    Ankle eversion     (Blank rows = not tested)   FUNCTIONAL TESTS:  6 minute walk test: 425 ft  GAIT: Distance walked: 425 Assistive device utilized: Walker - 2 wheeled Level of assistance: Modified independence Comments: forward flexed posture, requires cueing for shoulder depression and normal breathing  [facility] Adult PT Treatment:                                                DATE: 12/08/2022  Therapeutic Exercise:  nu-step L4 88m while taking subjective and planning session with patient Seated abdominal bracing with ball  squeeze - 3 sec hold x 10  Seated isometric abduction, with belt above knee x 20  Standing in //  significant bil support Mini-knee bends - 20x Standing slow march 20x HS curl - 2x 10 Heel raise - 30x Standing hip abd - x15 ea Hurdle stepping 1x fwd, lateral  Continue to incorporate seated neutral spine core strengthening during rest breaks.   [facility] Adult PT Treatment:  Therapeutic Exercise:  nu-step L3 49m while taking subjective and planning session  with patient Seated abdominal bracing - 5'' hold x10 Seated gentle YTB row with abdominal bracing- 3x10 Standing in // - significant bil support Mini-knee bends - 10x Standing slow march 20x HS curl - 2x 10 Heel raise - 30x Standing hip abd - x15 ea Lateral walking in // Hurdle stepping 8x fwd, alternating  Seated Rest breaks between sets  & adjusted TLSO for improved fit  Plan to incorporate seated neutral spine core strengthening during rest breaks.   [facility] Adult PT Treatment:  Therex:  nu-step L5 20m while taking subjective and planning session with patient Walking 185' interspersed with // exercises x2 Standing in // - significant bil support Standing slow march 14x HS curl - 14x Heel raise - 20x Standing hip abd - 2x10 ea 4'' step - D/C d/t pain Hurdle stepping fwd and immediately back - 2x5 ea     HOME EXERCISE PROGRAM: Access Code: [facility] URL: [email or web address] Date: 11/24/2022 Prepared by: Alphonzo Severance  Exercises - Seated Transversus Abdominis Bracing  - 3 x daily - 7 x weekly - 1 sets - 5 reps - 3 sec hold - Seated Diaphragmatic Breathing  - 3 x daily - 7 x weekly - Heel Raises with Counter Support  - 5 x daily - 7 x weekly - 2 sets - 10 reps - Standing March with Counter Support  - 5 x daily - 7 x weekly - 2 sets - 5-10 reps - Standing Hip Abduction with Counter Support  - 5 x daily - 7 x weekly - 2 sets - 10 reps  ASSESSMENT:  CLINICAL IMPRESSION: Nicholas Potts continues to be limited by pain and abdominal weakness. He had difficulty with TA activation today, however this improved with breathing following verbal cueing. He will continue to benefit from progression of core strengthening activities.    OBJECTIVE IMPAIRMENTS: cardiopulmonary status limiting activity, decreased activity tolerance, decreased balance, decreased endurance, decreased mobility, difficulty walking, decreased strength, and pain.   ACTIVITY LIMITATIONS: carrying,  lifting, bending, standing, squatting, sleeping, stairs, transfers, bed mobility, bathing, toileting, dressing, hygiene/grooming, and locomotion level  PARTICIPATION LIMITATIONS: meal prep, cleaning, interpersonal relationship, driving, shopping, community activity, occupation, and yard work  PERSONAL FACTORS: Past/current experiences, Profession, and 3+ comorbidities: PMHx includes CKD, CVA, HTN, OSA, Obesity   are also affecting patient's functional outcome.   REHAB POTENTIAL: Fair    CLINICAL DECISION MAKING: Evolving/moderate complexity  EVALUATION COMPLEXITY: Moderate   GOALS: Goals reviewed with patient? Yes  SHORT TERM GOALS: Target date: 12/19/2022  Patient will be independent with initial home program for core mm activation, and endurance program.  Baseline: provided at eval; to be progressed over next few tx sessions Goal status: INITIAL   LONG TERM GOALS: Target date: 02/13/2023   Patient will report improved overall functional ability with FOTO score of 49 or higher.   Baseline: 27 Goal status: INITIAL  2.  Patient will demonstrate ability to ascend/descend stairs and inclined surfaces without pain or difficulty, using reciprocal pattern.  Goal status: INITIAL  3.  Patient will report that he is able to perform all ADLs and IADLs without assistance.  Goal status: INITIAL  4.  Patient will report ability to safely lifting at least 30# from floor to waist, and waist to counter height in order to ensure safe return to work activities.  Goal status: INITIAL   PLAN:  PT FREQUENCY: 2x/week, decrease freq to 1x/week after first 8 weeks.   PT DURATION: 12 weeks  PLANNED INTERVENTIONS: Therapeutic exercises, Therapeutic activity, Neuromuscular re-education, Balance training, Gait training, Patient/Family education, Self Care, Stair training, Aquatic Therapy, Cryotherapy, Moist heat, Manual therapy, and Re-evaluation.  PLAN FOR NEXT SESSION: review and progress HEP as  appropriate, core strengthening, LE/UE endurance, walking tolerance, activity modifications as appropriate   Mauri Reading, PT, DPT 12/08/2022, 3:47 PM

## 2022-12-11 ENCOUNTER — Telehealth: Payer: Self-pay

## 2022-12-11 ENCOUNTER — Ambulatory Visit: Payer: BC Managed Care – PPO

## 2022-12-11 DIAGNOSIS — M5459 Other low back pain: Secondary | ICD-10-CM

## 2022-12-11 DIAGNOSIS — M6281 Muscle weakness (generalized): Secondary | ICD-10-CM

## 2022-12-11 DIAGNOSIS — R262 Difficulty in walking, not elsewhere classified: Secondary | ICD-10-CM

## 2022-12-11 DIAGNOSIS — M546 Pain in thoracic spine: Secondary | ICD-10-CM | POA: Diagnosis not present

## 2022-12-11 NOTE — Therapy (Signed)
Treatment Note   Patient Name: Nicholas Potts MRN: [id number] DOB:[birth year], 62 y.o., male Today's Date: 12/11/2022   END OF SESSION:  PT End of Session - 12/11/22 0831     Visit Number 6    Number of Visits 21    Date for PT Re-Evaluation 02/13/23    Authorization Type [employer], 30 visit limit PT/OT/ST    PT Start Time 0832    PT Stop Time 0915    PT Time Calculation (min) 43 min    Equipment Utilized During Treatment Other (comment)    Activity Tolerance Patient tolerated treatment well                Past Medical History:  Diagnosis Date   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HCC) 2014   post op    ARF (acute respiratory failure) (HCC) 2014   Blood dyscrasia    Chronic kidney disease 2014   ARF    Complication of anesthesia 2014   - post op - in combination of pain pills-  Cardiac and Respiratory Arrest- prior to sleep apnea diagonosis   Constipation    CVA (cerebral infarction)    Hyperlipidemia    Hypertension    OSA (obstructive sleep apnea)    Pre-diabetes    Short-term memory loss    Sleep apnea    Stress-induced cardiomyopathy -- RESOLVED    Post-op Narcotic induced Acute Hypoxic respiratory failure with respiratory and PEA cardiac arrest following accidental narcotic overdose; initial [place] 20-20%, now improved to 55%   Was supported with Impella Cardiac Cath - non-obstructive CAD ; 1 month after event - EF up from 25% to 55-60% - stable x 5 yrs   Thrombocytopenia (HCC) 2014   Past Surgical History:  Procedure Laterality Date   ACHILLES TENDON REPAIR     BIOPSY  08/01/2019   Procedure: BIOPSY;  Surgeon: Napoleon Form, MD;  Location: [facility];  Service: Endoscopy;;   BIOPSY  08/05/2020   Procedure: BIOPSY;  Surgeon: Napoleon Form, MD;  Location: [facility];  Service: Endoscopy;;   CARDIAC CATHETERIZATION  12/29/2012   RN LHC --> Impella. for severe shock with low EF. Mild to moderate RV pressure elevation. Normal coronary arteries. Cardiac  Output 2.5, Index 1.46.   COLONOSCOPY WITH PROPOFOL N/A 10/30/2015   Procedure: COLONOSCOPY WITH PROPOFOL;  Surgeon: Napoleon Form, MD;  Location: [facility];  Service: Endoscopy;  Laterality: N/A;   COLONOSCOPY WITH PROPOFOL N/A 08/01/2019   Procedure: COLONOSCOPY WITH PROPOFOL;  Surgeon: Napoleon Form, MD;  Location: [facility];  Service: Endoscopy;  Laterality: N/A;   COLONOSCOPY WITH PROPOFOL N/A 08/05/2020   Procedure: COLONOSCOPY WITH PROPOFOL;  Surgeon: Napoleon Form, MD;  Location: [facility];  Service: Endoscopy;  Laterality: N/A;   INSERTION OF DIALYSIS CATHETER Right 01/09/2013   Procedure: INSERTION OF DIALYSIS CATHETER;  Surgeon: Chuck Hint, MD;  Location: [facility] OR;  Service: Vascular;  Laterality: Right;   LAMINECTOMY WITH POSTERIOR LATERAL ARTHRODESIS LEVEL 3 N/A 10/15/2022   Procedure: THORACIC NINE-THORACIC TWELVE POSTERIOR INSTRUMENTED FUSION REDUCTION OF FRACTURE;  Surgeon: Bedelia Person, MD;  Location: [facility] OR;  Service: Neurosurgery;  Laterality: N/A;   LEFT AND RIGHT HEART CATHETERIZATION WITH CORONARY ANGIOGRAM  12/29/2012   Procedure: LEFT AND RIGHT HEART CATHETERIZATION WITH CORONARY ANGIOGRAM;  Surgeon: Tonny Bollman, MD;  Location: [facility] CATH LAB;  Service: Cardiovascular;;   POLYPECTOMY  08/01/2019   Procedure: POLYPECTOMY;  Surgeon: Napoleon Form, MD;  Location: [facility];  Service: Endoscopy;;  POLYPECTOMY  08/05/2020   Procedure: POLYPECTOMY;  Surgeon: Napoleon Form, MD;  Location: [facility];  Service: Endoscopy;;   TRANSTHORACIC ECHOCARDIOGRAM  12/29/2012   EF 25% with periapical HK/AKA.  --> Followup echo 7/25 -- EF of 35%   TRANSTHORACIC ECHOCARDIOGRAM  01/2013 ; 07/2015   a) EF 55-60%. No regional WMA. Grade 1 diastolic dysfunction. Mild LA dilatation.; b) Normal LV size with mild LV hypertrophy. EF 55-60%. Normal RV   TRANSTHORACIC ECHOCARDIOGRAM  03/2018   EF 55-60%.  NO RWMA. Normal valves   Patient Active  Problem List   Diagnosis Date Noted   Injury of thoracic spine (HCC) 10/22/2022   Dorsal (thoracic) vertebral fracture (HCC) 10/16/2022   Injury 10/15/2022   Urinary urgency 07/10/2022   Urinary frequency 07/10/2022   Morbid obesity with BMI of 40.0-44.9, adult (HCC) 09/10/2021   Fatigue 09/10/2021   History of colonic polyps    Polyp of ascending colon    Polyp of transverse colon    Polyp of cecum    Polyp of descending colon    Polyp of sigmoid colon    Hypersomnia 11/29/2017   Other headache syndrome 05/05/2017   Vertigo 05/05/2017   IFG (impaired fasting glucose) 08/21/2016   Obesity (BMI 30-39.9) 06/23/2013   Stress-induced cardiomyopathy -- essentially resolved    CVA (cerebral infarction)    OSA (obstructive sleep apnea)    Hyperlipidemia    Physical deconditioning 01/24/2013   Tinea pedis 01/18/2013   Essential hypertension 01/13/2013   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   ARF (acute renal failure) with tubular necrosis requiring HD 01/11/2013   Anemia 01/11/2013   Thrombocytopenia, unspecified (HCC) 01/11/2013   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 12/29/2012    PCP: Shade Flood, MD  REFERRING PROVIDER: Genice Rouge, MD  REFERRING DIAG: Unspecified fracture of unspecified thoracic vertebra, initial encounter for closed fracture [S22.009A], Unspecified injury at unspecified level of thoracic spinal cord, initial encounter [S24.109A]   Rationale for Evaluation and Treatment: Rehabilitation  THERAPY DIAG:  Pain in thoracic spine  Other low back pain  Difficulty in walking, not elsewhere classified  Muscle weakness (generalized)  ONSET DATE: 10/15/22  SUBJECTIVE:                                                                                                                                                                                           SUBJECTIVE STATEMENT:  Patient reporting  6.5/10 pain today, he has been working on abdominal bracing at home and is feeling some related soreness.   Interpretation provided by pt's daughter today per  their preference; interpretation services denied.    PERTINENT HISTORY:   OP Date: 10/15/2022  2 weeks 10/29/2022  4 weeks 11/12/2022  6 weeks 11/26/2022  8 weeks 12/10/2022  10 weeks 12/24/2022  12 weeks 01/07/2023    open reduction of T10-11 fracture and posterior lateral arthrodesis of T9-03-19-11  PMHx includes ARF, CKD, CVA, HTN, OSA, Obesity, Vertigo, Anemia, hx of cardiac and respiratory arrest related to unintentional narcotic overdose   PAIN:  Are you having pain? Yes: NPRS scale: 6-7.5/10 Pain location: along surgical site Pain description: sharp Aggravating factors: none identified at eval  Relieving factors: pain medication  PRECAUTIONS: Other: No lifting more than 1 gallon, no twisting, no bending forward.   WEIGHT BEARING RESTRICTIONS:  lifting restrictions as noted in precautions  FALLS:  Has patient fallen in last 6 months? No  LIVING ENVIRONMENT: Lives with: lives with their family Lives in: House/apartment Stairs: Yes: External: 5 steps; can reach both Has following equipment at home: Dan Humphreys - 2 wheeled  OCCUPATION: lifting/assembling furniture Health visitor)   PLOF: Independent and Independent with basic ADLs  PATIENT GOALS: return to PLOF, including work   NEXT MD VISIT: 11/26/22 to cardiology, 12/03/22 to PCP   OBJECTIVE:   DIAGNOSTIC FINDINGS:  10/15/22 DG THORACOLUMBAR SPINE (post op)  FINDINGS: Pedicle screws are noted at T9, T10, T11 and T12. Posterior fixation elements are not visualized at this time.   IMPRESSION: Pedicle screw placement from T9-T12 for fixation surrounding a distraction injury at T10-T11.  PATIENT SURVEYS:  FOTO 27 current, 49 predicted  SCREENING FOR RED FLAGS: Bowel or bladder incontinence: Yes: present before accident related to CKD  Spinal tumors: No Cauda  equina syndrome: No Compression fracture: No Abdominal aneurysm: No  COGNITION: Overall cognitive status: Within functional limits for tasks assessed      POSTURE: rounded shoulders, forward head, and use of TLSO limited further postural assessment  PALPATION: Not assessed at eval   LUMBAR ROM: not tested at eval d/t post op status   AROM eval  Flexion   Extension   Right lateral flexion   Left lateral flexion   Right rotation   Left rotation    (Blank rows = not tested)  Upper extremity MMT grossly: 4+/5 MMT bilaterally   LOWER EXTREMITY MMT:    MMT Right eval Left eval  Hip flexion 4+ 4+  Hip extension    Hip abduction 4+ 4+  Hip adduction 4 4  Hip internal rotation    Hip external rotation    Knee flexion 5 5  Knee extension 5 5  Ankle dorsiflexion 5 5  Ankle plantarflexion    Ankle inversion    Ankle eversion     (Blank rows = not tested)   FUNCTIONAL TESTS:  6 minute walk test: 425 ft  GAIT: Distance walked: 425 Assistive device utilized: Environmental consultant - 2 wheeled Level of assistance: Modified independence Comments: forward flexed posture, requires cueing for shoulder depression and normal breathing  [facility] Adult PT Treatment:                                                DATE: 12/11/2022  Therapeutic Exercise: nu-step L5 31m while taking subjective and planning session with patient Seated abdominal bracing with ball squeeze - 3 sec hold x 10 Seated isometric abduction, with belt above knee x 20  Standing in //  bars with bil support Mini-squats - 20x Walking marches x 3 laps  HS curl - 2x 10 Heel raise - 30x Toe raise - 20x Standing hip abd - x15 ea Hurdle stepping 1x fwd, 2x lateral   [facility] Adult PT Treatment:                                                DATE: 12/08/2022  Therapeutic Exercise:  nu-step L4 60m while taking subjective and planning session with patient Seated abdominal bracing with ball squeeze - 3 sec hold x 10  Seated isometric  abduction, with belt above knee x 20  Standing in //  significant bil support Mini-knee bends - 20x Standing slow march 20x HS curl - 2x 10 Heel raise - 30x Standing hip abd - x15 ea Hurdle stepping 1x fwd, lateral  Continue to incorporate seated neutral spine core strengthening during rest breaks.   [facility] Adult PT Treatment:  Therapeutic Exercise:  nu-step L3 75m while taking subjective and planning session with patient Seated abdominal bracing - 5'' hold x10 Seated gentle YTB row with abdominal bracing- 3x10 Standing in // - significant bil support Mini-knee bends - 10x Standing slow march 20x HS curl - 2x 10 Heel raise - 30x Standing hip abd - x15 ea Lateral walking in // Hurdle stepping 8x fwd, alternating  Seated Rest breaks between sets  & adjusted TLSO for improved fit  Plan to incorporate seated neutral spine core strengthening during rest breaks.   [facility] Adult PT Treatment:  Therex:  nu-step L5 33m while taking subjective and planning session with patient Walking 185' interspersed with // exercises x2 Standing in // - significant bil support Standing slow march 14x HS curl - 14x Heel raise - 20x Standing hip abd - 2x10 ea 4'' step - D/C d/t pain Hurdle stepping fwd and immediately back - 2x5 ea     HOME EXERCISE PROGRAM: Access Code: [facility] URL: [email or web address] Date: 11/24/2022 Prepared by: Alphonzo Severance  Exercises - Seated Transversus Abdominis Bracing  - 3 x daily - 7 x weekly - 1 sets - 5 reps - 3 sec hold - Seated Diaphragmatic Breathing  - 3 x daily - 7 x weekly - Heel Raises with Counter Support  - 5 x daily - 7 x weekly - 2 sets - 10 reps - Standing March with Counter Support  - 5 x daily - 7 x weekly - 2 sets - 5-10 reps - Standing Hip Abduction with Counter Support  - 5 x daily - 7 x weekly - 2 sets - 10 reps  ASSESSMENT:  CLINICAL IMPRESSION: Darrio continues to demonstrate improved activity tolerance with  improved abdominal bracing and decrease reliance on external cueing for breathing. He was most challenged by forward hurdle navigation today. He will continue to benefit from some core strengthening activities as appropriate.     OBJECTIVE IMPAIRMENTS: cardiopulmonary status limiting activity, decreased activity tolerance, decreased balance, decreased endurance, decreased mobility, difficulty walking, decreased strength, and pain.   ACTIVITY LIMITATIONS: carrying, lifting, bending, standing, squatting, sleeping, stairs, transfers, bed mobility, bathing, toileting, dressing, hygiene/grooming, and locomotion level  PARTICIPATION LIMITATIONS: meal prep, cleaning, interpersonal relationship, driving, shopping, community activity, occupation, and yard work  PERSONAL FACTORS: Past/current experiences, Profession, and 3+ comorbidities: PMHx includes CKD, CVA, HTN, OSA, Obesity   are also affecting patient's functional outcome.  REHAB POTENTIAL: Fair    CLINICAL DECISION MAKING: Evolving/moderate complexity  EVALUATION COMPLEXITY: Moderate   GOALS: Goals reviewed with patient? Yes  SHORT TERM GOALS: Target date: 12/19/2022  Patient will be independent with initial home program for core mm activation, and endurance program.  Baseline: provided at eval; to be progressed over next few tx sessions Goal status: INITIAL   LONG TERM GOALS: Target date: 02/13/2023   Patient will report improved overall functional ability with FOTO score of 49 or higher.   Baseline: 27 Goal status: INITIAL  2.  Patient will demonstrate ability to ascend/descend stairs and inclined surfaces without pain or difficulty, using reciprocal pattern.  Goal status: INITIAL  3.  Patient will report that he is able to perform all ADLs and IADLs without assistance.  Goal status: INITIAL  4.  Patient will report ability to safely lifting at least 30# from floor to waist, and waist to counter height in order to ensure safe  return to work activities.  Goal status: INITIAL   PLAN:  PT FREQUENCY: 2x/week, decrease freq to 1x/week after first 8 weeks.   PT DURATION: 12 weeks  PLANNED INTERVENTIONS: Therapeutic exercises, Therapeutic activity, Neuromuscular re-education, Balance training, Gait training, Patient/Family education, Self Care, Stair training, Aquatic Therapy, Cryotherapy, Moist heat, Manual therapy, and Re-evaluation.  PLAN FOR NEXT SESSION: review and progress HEP as appropriate, core strengthening, LE/UE endurance, walking tolerance, activity modifications as appropriate   Mauri Reading, PT, DPT 12/11/2022, 10:04 AM

## 2022-12-11 NOTE — Telephone Encounter (Signed)
Interpreter left a vm for pt to call office in regards to lab results

## 2022-12-15 ENCOUNTER — Ambulatory Visit: Payer: BC Managed Care – PPO

## 2022-12-15 DIAGNOSIS — M5459 Other low back pain: Secondary | ICD-10-CM

## 2022-12-15 DIAGNOSIS — M546 Pain in thoracic spine: Secondary | ICD-10-CM | POA: Diagnosis not present

## 2022-12-15 DIAGNOSIS — R262 Difficulty in walking, not elsewhere classified: Secondary | ICD-10-CM

## 2022-12-15 DIAGNOSIS — M6281 Muscle weakness (generalized): Secondary | ICD-10-CM

## 2022-12-15 NOTE — Therapy (Signed)
Treatment Note   Patient Name: Nicholas Potts MRN: [id number] DOB:04-17-1961, 62 y.o., male Today's Date: 12/15/2022   END OF SESSION:  PT End of Session - 12/15/22 1146     Visit Number 7    Number of Visits 21    Date for PT Re-Evaluation 02/13/23    Authorization Type [employer], 30 visit limit PT/OT/ST    PT Start Time 1140    PT Stop Time 1220    PT Time Calculation (min) 40 min    Activity Tolerance Patient tolerated treatment well;Patient limited by pain    Behavior During Therapy [facility] for tasks assessed/performed                Past Medical History:  Diagnosis Date   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HCC) 2014   post op    ARF (acute respiratory failure) (HCC) 2014   Blood dyscrasia    Chronic kidney disease 2014   ARF    Complication of anesthesia 2014   - post op - in combination of pain pills-  Cardiac and Respiratory Arrest- prior to sleep apnea diagonosis   Constipation    CVA (cerebral infarction)    Hyperlipidemia    Hypertension    OSA (obstructive sleep apnea)    Pre-diabetes    Short-term memory loss    Sleep apnea    Stress-induced cardiomyopathy -- RESOLVED    Post-op Narcotic induced Acute Hypoxic respiratory failure with respiratory and PEA cardiac arrest following accidental narcotic overdose; initial [place] 20-20%, now improved to 55%   Was supported with Impella Cardiac Cath - non-obstructive CAD ; 1 month after event - EF up from 25% to 55-60% - stable x 5 yrs   Thrombocytopenia (HCC) 2014   Past Surgical History:  Procedure Laterality Date   ACHILLES TENDON REPAIR     BIOPSY  08/01/2019   Procedure: BIOPSY;  Surgeon: Napoleon Form, MD;  Location: [facility];  Service: Endoscopy;;   BIOPSY  08/05/2020   Procedure: BIOPSY;  Surgeon: Napoleon Form, MD;  Location: [facility];  Service: Endoscopy;;   CARDIAC CATHETERIZATION  12/29/2012   RN LHC --> Impella. for severe shock with low EF. Mild to moderate RV pressure elevation. Normal  coronary arteries. Cardiac Output 2.5, Index 1.46.   COLONOSCOPY WITH PROPOFOL N/A 10/30/2015   Procedure: COLONOSCOPY WITH PROPOFOL;  Surgeon: Napoleon Form, MD;  Location: [facility];  Service: Endoscopy;  Laterality: N/A;   COLONOSCOPY WITH PROPOFOL N/A 08/01/2019   Procedure: COLONOSCOPY WITH PROPOFOL;  Surgeon: Napoleon Form, MD;  Location: [facility];  Service: Endoscopy;  Laterality: N/A;   COLONOSCOPY WITH PROPOFOL N/A 08/05/2020   Procedure: COLONOSCOPY WITH PROPOFOL;  Surgeon: Napoleon Form, MD;  Location: [facility];  Service: Endoscopy;  Laterality: N/A;   INSERTION OF DIALYSIS CATHETER Right 01/09/2013   Procedure: INSERTION OF DIALYSIS CATHETER;  Surgeon: Chuck Hint, MD;  Location: [facility] OR;  Service: Vascular;  Laterality: Right;   LAMINECTOMY WITH POSTERIOR LATERAL ARTHRODESIS LEVEL 3 N/A 10/15/2022   Procedure: THORACIC NINE-THORACIC TWELVE POSTERIOR INSTRUMENTED FUSION REDUCTION OF FRACTURE;  Surgeon: Bedelia Person, MD;  Location: [facility];  Service: Neurosurgery;  Laterality: N/A;   LEFT AND RIGHT HEART CATHETERIZATION WITH CORONARY ANGIOGRAM  12/29/2012   Procedure: LEFT AND RIGHT HEART CATHETERIZATION WITH CORONARY ANGIOGRAM;  Surgeon: Tonny Bollman, MD;  Location: [facility] CATH LAB;  Service: Cardiovascular;;   POLYPECTOMY  08/01/2019   Procedure: POLYPECTOMY;  Surgeon: Napoleon Form, MD;  Location: [facility]  ENDOS[facility];  Service: Endoscopy;;   POLYPECTOMY  08/05/2020   Procedure: POLYPECTOMY;  Surgeon: Napoleon Form, MD;  Location: [facility];  Service: Endoscopy;;   TRANSTHORACIC ECHOCARDIOGRAM  12/29/2012   EF 25% with periapical HK/AKA.  --> Followup echo 7/25 -- EF of 35%   TRANSTHORACIC ECHOCARDIOGRAM  01/2013 ; 07/2015   a) EF 55-60%. No regional WMA. Grade 1 diastolic dysfunction. Mild LA dilatation.; b) Normal LV size with mild LV hypertrophy. EF 55-60%. Normal RV   TRANSTHORACIC ECHOCARDIOGRAM  03/2018   EF 55-60%.  NO RWMA. Normal  valves   Patient Active Problem List   Diagnosis Date Noted   Injury of thoracic spine (HCC) 10/22/2022   Dorsal (thoracic) vertebral fracture (HCC) 10/16/2022   Injury 10/15/2022   Urinary urgency 07/10/2022   Urinary frequency 07/10/2022   Morbid obesity with BMI of 40.0-44.9, adult (HCC) 09/10/2021   Fatigue 09/10/2021   History of colonic polyps    Polyp of ascending colon    Polyp of transverse colon    Polyp of cecum    Polyp of descending colon    Polyp of sigmoid colon    Hypersomnia 11/29/2017   Other headache syndrome 05/05/2017   Vertigo 05/05/2017   IFG (impaired fasting glucose) 08/21/2016   Obesity (BMI 30-39.9) 06/23/2013   Stress-induced cardiomyopathy -- essentially resolved    CVA (cerebral infarction)    OSA (obstructive sleep apnea)    Hyperlipidemia    Physical deconditioning 01/24/2013   Tinea pedis 01/18/2013   Essential hypertension 01/13/2013   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   ARF (acute renal failure) with tubular necrosis requiring HD 01/11/2013   Anemia 01/11/2013   Thrombocytopenia, unspecified (HCC) 01/11/2013   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 12/29/2012    PCP: Shade Flood, MD  REFERRING PROVIDER: Genice Rouge, MD  REFERRING DIAG: Unspecified fracture of unspecified thoracic vertebra, initial encounter for closed fracture [S22.009A], Unspecified injury at unspecified level of thoracic spinal cord, initial encounter [S24.109A]   Rationale for Evaluation and Treatment: Rehabilitation  THERAPY DIAG:  Pain in thoracic spine  Other low back pain  Difficulty in walking, not elsewhere classified  Muscle weakness (generalized)  ONSET DATE: 10/15/22  SUBJECTIVE:                                                                                                                                                                                           SUBJECTIVE  STATEMENT:  Patient and his daughter report that he went to see physician who updated his activity restrictions: "He can do more core strengthening, but  no repetitive forward bending, sidebending, or twisting. He can lift more, but no more than 12#."   They also report that over the weekend, he was cutting up a watermelon and didn't think about how much work it was. He had too much pain after cutting half of the watermelon and had to stop.   Interpretation provided by pt's daughter today per their preference; interpretation services denied.    PERTINENT HISTORY:   OP Date: 10/15/2022  2 weeks 10/29/2022  4 weeks 11/12/2022  6 weeks 11/26/2022  8 weeks 12/10/2022  10 weeks 12/24/2022  12 weeks 01/07/2023    open reduction of T10-11 fracture and posterior lateral arthrodesis of T9-03-19-11  PMHx includes ARF, CKD, CVA, HTN, OSA, Obesity, Vertigo, Anemia, hx of cardiac and respiratory arrest related to unintentional narcotic overdose   PAIN:  Are you having pain? Yes: NPRS scale: 6-7.5/10 Pain location: along surgical site Pain description: sharp Aggravating factors: none identified at eval  Relieving factors: pain medication  PRECAUTIONS: Other: No lifting more than 1 gallon, no twisting, no bending forward.   WEIGHT BEARING RESTRICTIONS:  lifting restrictions as noted in precautions  FALLS:  Has patient fallen in last 6 months? No  LIVING ENVIRONMENT: Lives with: lives with their family Lives in: House/apartment Stairs: Yes: External: 5 steps; can reach both Has following equipment at home: Dan Humphreys - 2 wheeled  OCCUPATION: lifting/assembling furniture Health visitor)   PLOF: Independent and Independent with basic ADLs  PATIENT GOALS: return to PLOF, including work   NEXT MD VISIT: 11/26/22 to cardiology, 12/03/22 to PCP   OBJECTIVE:   DIAGNOSTIC FINDINGS:  10/15/22 DG THORACOLUMBAR SPINE (post op)  FINDINGS: Pedicle screws are noted at T9, T10, T11 and T12. Posterior fixation  elements are not visualized at this time.   IMPRESSION: Pedicle screw placement from T9-T12 for fixation surrounding a distraction injury at T10-T11.  PATIENT SURVEYS:  FOTO 27 current, 49 predicted  SCREENING FOR RED FLAGS: Bowel or bladder incontinence: Yes: present before accident related to CKD  Spinal tumors: No Cauda equina syndrome: No Compression fracture: No Abdominal aneurysm: No  COGNITION: Overall cognitive status: Within functional limits for tasks assessed      POSTURE: rounded shoulders, forward head, and use of TLSO limited further postural assessment  PALPATION: Not assessed at eval   LUMBAR ROM: not tested at eval d/t post op status   AROM eval  Flexion   Extension   Right lateral flexion   Left lateral flexion   Right rotation   Left rotation    (Blank rows = not tested)  Upper extremity MMT grossly: 4+/5 MMT bilaterally   LOWER EXTREMITY MMT:    MMT Right eval Left eval  Hip flexion 4+ 4+  Hip extension    Hip abduction 4+ 4+  Hip adduction 4 4  Hip internal rotation    Hip external rotation    Knee flexion 5 5  Knee extension 5 5  Ankle dorsiflexion 5 5  Ankle plantarflexion    Ankle inversion    Ankle eversion     (Blank rows = not tested)   FUNCTIONAL TESTS:  6 minute walk test: 425 ft  GAIT: Distance walked: 425 Assistive device utilized: Walker - 2 wheeled Level of assistance: Modified independence Comments: forward flexed posture, requires cueing for shoulder depression and normal breathing   [facility] Adult PT Treatment:  DATE: 12/15/2022  Therapeutic Exercise: nu-step L5 28m while taking subjective and planning session with patient Seated abdominal bracing with ball squeeze - 5 sec hold x 20 Seated isometric abduction, with belt above knee x 20  Seated Pallof Press with green TB, 2 x 10 each side  Seated pball press, 2 x 10  Standing in //  bars with bil support Walking  marches x 3 laps  Heel raise - 30x Toe raise - 20x Hurdle stepping 2x fwd, 2x lateral  [facility] Adult PT Treatment:                                                DATE: 12/11/2022   Therapeutic Exercise: nu-step L5 66m while taking subjective and planning session with patient Seated abdominal bracing with ball squeeze - 3 sec hold x 10 Seated isometric abduction, with belt above knee x 20  Standing in //  bars with bil support Mini-squats - 20x Walking marches x 3 laps  HS curl - 2x 10 Heel raise - 30x Toe raise - 20x Standing hip abd - x15 ea Hurdle stepping 1x fwd, 2x lateral   [facility] Adult PT Treatment:                                                DATE: 12/08/2022  Therapeutic Exercise:  nu-step L4 70m while taking subjective and planning session with patient Seated abdominal bracing with ball squeeze - 3 sec hold x 10  Seated isometric abduction, with belt above knee x 20  Standing in //  significant bil support Mini-knee bends - 20x Standing slow march 20x HS curl - 2x 10 Heel raise - 30x Standing hip abd - x15 ea Hurdle stepping 1x fwd, lateral  Continue to incorporate seated neutral spine core strengthening during rest breaks.     HOME EXERCISE PROGRAM: Access Code: [employer] URL: [email or web address] Date: 12/15/2022 Prepared by: Mauri Reading  Exercises - Seated Diaphragmatic Breathing  - 3 x daily - 7 x weekly - Seated Transversus Abdominis Bracing  - 5 x daily - 7 x weekly - 1 sets - 10 reps - 5 seg hold - Seated Hip Adduction Isometrics with Ball  - 5 x daily - 7 x weekly - 2 sets - 10 reps - 5 seg hold - Standing March with Counter Support  - 5 x daily - 7 x weekly - 2 sets - 5-10 reps - Standing Hip Abduction with Counter Support  - 5 x daily - 7 x weekly - 2 sets - 10 reps   ASSESSMENT:  CLINICAL IMPRESSION: Nicholas Potts continues to have difficulty with hurdles navigation and has increased pain severity from 6/10-8/10 following fwd/lat step  overs. He was able to progress with seated core strengthening, but continues to require cueing for breathing with exercises. He will benefit from increased core strengthening activities as appropriate. We will continue to progress towards improving tolerance of daily activities as able.    OBJECTIVE IMPAIRMENTS: cardiopulmonary status limiting activity, decreased activity tolerance, decreased balance, decreased endurance, decreased mobility, difficulty walking, decreased strength, and pain.   ACTIVITY LIMITATIONS: carrying, lifting, bending, standing, squatting, sleeping, stairs, transfers, bed mobility, bathing, toileting, dressing, hygiene/grooming, and locomotion level  PARTICIPATION LIMITATIONS:  meal prep, cleaning, interpersonal relationship, driving, shopping, community activity, occupation, and yard work  PERSONAL FACTORS: Past/current experiences, Profession, and 3+ comorbidities: PMHx includes CKD, CVA, HTN, OSA, Obesity   are also affecting patient's functional outcome.   REHAB POTENTIAL: Fair    CLINICAL DECISION MAKING: Evolving/moderate complexity  EVALUATION COMPLEXITY: Moderate   GOALS: Goals reviewed with patient? Yes  SHORT TERM GOALS: Target date: 12/19/2022  Patient will be independent with initial home program for core mm activation, and endurance program.  Baseline: provided at eval; to be progressed over next few tx sessions Goal status: INITIAL   LONG TERM GOALS: Target date: 02/13/2023   Patient will report improved overall functional ability with FOTO score of 49 or higher.   Baseline: 27 Goal status: INITIAL  2.  Patient will demonstrate ability to ascend/descend stairs and inclined surfaces without pain or difficulty, using reciprocal pattern.  Goal status: INITIAL  3.  Patient will report that he is able to perform all ADLs and IADLs without assistance.  Goal status: INITIAL  4.  Patient will report ability to safely lifting at least 30# from floor to  waist, and waist to counter height in order to ensure safe return to work activities.  Goal status: INITIAL   PLAN:  PT FREQUENCY: 2x/week, decrease freq to 1x/week after first 8 weeks.   PT DURATION: 12 weeks  PLANNED INTERVENTIONS: Therapeutic exercises, Therapeutic activity, Neuromuscular re-education, Balance training, Gait training, Patient/Family education, Self Care, Stair training, Aquatic Therapy, Cryotherapy, Moist heat, Manual therapy, and Re-evaluation.  PLAN FOR NEXT SESSION: provide new copy of HEP, focus on neutral spine core strengthening activities, LE endurance and pt education regarding activity modifications as appropriate   Mauri Reading, PT, DPT 12/15/2022, 12:44 PM

## 2022-12-17 ENCOUNTER — Ambulatory Visit: Payer: BC Managed Care – PPO

## 2022-12-17 DIAGNOSIS — M6281 Muscle weakness (generalized): Secondary | ICD-10-CM

## 2022-12-17 DIAGNOSIS — M546 Pain in thoracic spine: Secondary | ICD-10-CM

## 2022-12-17 DIAGNOSIS — R262 Difficulty in walking, not elsewhere classified: Secondary | ICD-10-CM

## 2022-12-17 DIAGNOSIS — M5459 Other low back pain: Secondary | ICD-10-CM

## 2022-12-17 NOTE — Therapy (Signed)
Treatment Note   Patient Name: Nicholas Potts MRN: [id number] DOB:01/21/1961, 62 y.o., male Today's Date: 12/17/2022   END OF SESSION:  PT End of Session - 12/17/22 1136     Visit Number 8    Number of Visits 21    Date for PT Re-Evaluation 02/13/23    Authorization Type [employer], 30 visit limit PT/OT/ST    PT Start Time 1130    PT Stop Time 1208    PT Time Calculation (min) 38 min    Activity Tolerance Patient tolerated treatment well;Patient limited by pain    Behavior During Therapy [facility] for tasks assessed/performed                 Past Medical History:  Diagnosis Date   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HCC) 2014   post op    ARF (acute respiratory failure) (HCC) 2014   Blood dyscrasia    Chronic kidney disease 2014   ARF    Complication of anesthesia 2014   - post op - in combination of pain pills-  Cardiac and Respiratory Arrest- prior to sleep apnea diagonosis   Constipation    CVA (cerebral infarction)    Hyperlipidemia    Hypertension    OSA (obstructive sleep apnea)    Pre-diabetes    Short-term memory loss    Sleep apnea    Stress-induced cardiomyopathy -- RESOLVED    Post-op Narcotic induced Acute Hypoxic respiratory failure with respiratory and PEA cardiac arrest following accidental narcotic overdose; initial [place] 20-20%, now improved to 55%   Was supported with Impella Cardiac Cath - non-obstructive CAD ; 1 month after event - EF up from 25% to 55-60% - stable x 5 yrs   Thrombocytopenia (HCC) 2014   Past Surgical History:  Procedure Laterality Date   ACHILLES TENDON REPAIR     BIOPSY  08/01/2019   Procedure: BIOPSY;  Surgeon: Napoleon Form, MD;  Location: [facility];  Service: Endoscopy;;   BIOPSY  08/05/2020   Procedure: BIOPSY;  Surgeon: Napoleon Form, MD;  Location: [facility];  Service: Endoscopy;;   CARDIAC CATHETERIZATION  12/29/2012   RN LHC --> Impella. for severe shock with low EF. Mild to moderate RV pressure elevation.  Normal coronary arteries. Cardiac Output 2.5, Index 1.46.   COLONOSCOPY WITH PROPOFOL N/A 10/30/2015   Procedure: COLONOSCOPY WITH PROPOFOL;  Surgeon: Napoleon Form, MD;  Location: [facility];  Service: Endoscopy;  Laterality: N/A;   COLONOSCOPY WITH PROPOFOL N/A 08/01/2019   Procedure: COLONOSCOPY WITH PROPOFOL;  Surgeon: Napoleon Form, MD;  Location: [facility];  Service: Endoscopy;  Laterality: N/A;   COLONOSCOPY WITH PROPOFOL N/A 08/05/2020   Procedure: COLONOSCOPY WITH PROPOFOL;  Surgeon: Napoleon Form, MD;  Location: [facility];  Service: Endoscopy;  Laterality: N/A;   INSERTION OF DIALYSIS CATHETER Right 01/09/2013   Procedure: INSERTION OF DIALYSIS CATHETER;  Surgeon: Chuck Hint, MD;  Location: [facility] OR;  Service: Vascular;  Laterality: Right;   LAMINECTOMY WITH POSTERIOR LATERAL ARTHRODESIS LEVEL 3 N/A 10/15/2022   Procedure: THORACIC NINE-THORACIC TWELVE POSTERIOR INSTRUMENTED FUSION REDUCTION OF FRACTURE;  Surgeon: Bedelia Person, MD;  Location: [facility] OR;  Service: Neurosurgery;  Laterality: N/A;   LEFT AND RIGHT HEART CATHETERIZATION WITH CORONARY ANGIOGRAM  12/29/2012   Procedure: LEFT AND RIGHT HEART CATHETERIZATION WITH CORONARY ANGIOGRAM;  Surgeon: Tonny Bollman, MD;  Location: [facility] CATH LAB;  Service: Cardiovascular;;   POLYPECTOMY  08/01/2019   Procedure: POLYPECTOMY;  Surgeon: Napoleon Form, MD;  Location:  [facility];  Service: Endoscopy;;   POLYPECTOMY  08/05/2020   Procedure: POLYPECTOMY;  Surgeon: Napoleon Form, MD;  Location: [facility];  Service: Endoscopy;;   TRANSTHORACIC ECHOCARDIOGRAM  12/29/2012   EF 25% with periapical HK/AKA.  --> Followup echo 7/25 -- EF of 35%   TRANSTHORACIC ECHOCARDIOGRAM  01/2013 ; 07/2015   a) EF 55-60%. No regional WMA. Grade 1 diastolic dysfunction. Mild LA dilatation.; b) Normal LV size with mild LV hypertrophy. EF 55-60%. Normal RV   TRANSTHORACIC ECHOCARDIOGRAM  03/2018   EF 55-60%.  NO RWMA.  Normal valves   Patient Active Problem List   Diagnosis Date Noted   Injury of thoracic spine (HCC) 10/22/2022   Dorsal (thoracic) vertebral fracture (HCC) 10/16/2022   Injury 10/15/2022   Urinary urgency 07/10/2022   Urinary frequency 07/10/2022   Morbid obesity with BMI of 40.0-44.9, adult (HCC) 09/10/2021   Fatigue 09/10/2021   History of colonic polyps    Polyp of ascending colon    Polyp of transverse colon    Polyp of cecum    Polyp of descending colon    Polyp of sigmoid colon    Hypersomnia 11/29/2017   Other headache syndrome 05/05/2017   Vertigo 05/05/2017   IFG (impaired fasting glucose) 08/21/2016   Obesity (BMI 30-39.9) 06/23/2013   Stress-induced cardiomyopathy -- essentially resolved    CVA (cerebral infarction)    OSA (obstructive sleep apnea)    Hyperlipidemia    Physical deconditioning 01/24/2013   Tinea pedis 01/18/2013   Essential hypertension 01/13/2013   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   ARF (acute renal failure) with tubular necrosis requiring HD 01/11/2013   Anemia 01/11/2013   Thrombocytopenia, unspecified (HCC) 01/11/2013   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 12/29/2012    PCP: Shade Flood, MD  REFERRING PROVIDER: Genice Rouge, MD  REFERRING DIAG: Unspecified fracture of unspecified thoracic vertebra, initial encounter for closed fracture [S22.009A], Unspecified injury at unspecified level of thoracic spinal cord, initial encounter [S24.109A]   Rationale for Evaluation and Treatment: Rehabilitation  THERAPY DIAG:  Pain in thoracic spine  Other low back pain  Muscle weakness (generalized)  Difficulty in walking, not elsewhere classified  ONSET DATE: 10/15/22  SUBJECTIVE:                                                                                                                                                                                           SUBJECTIVE  STATEMENT:  Patient reporting no worsening of symptoms since last visit.   Interpretation provided by pt's daughter today per their preference; interpretation services  denied.    PERTINENT HISTORY:   OP Date: 10/15/2022  2 weeks 10/29/2022  4 weeks 11/12/2022  6 weeks 11/26/2022  8 weeks 12/10/2022  10 weeks 12/24/2022  12 weeks 01/07/2023    open reduction of T10-11 fracture and posterior lateral arthrodesis of T9-03-19-11  Per note on 7/9: Patient and his daughter report that he went to see physician who updated his activity restrictions: "He can do more core strengthening, but no repetitive forward bending, sidebending, or twisting. He can lift more, but no more than 12#."   PMHx includes ARF, CKD, CVA, HTN, OSA, Obesity, Vertigo, Anemia, hx of cardiac and respiratory arrest related to unintentional narcotic overdose   PAIN:  Are you having pain? Yes: NPRS scale: 6-7.5/10 Pain location: along surgical site Pain description: sharp Aggravating factors: none identified at eval  Relieving factors: pain medication  PRECAUTIONS: Other: No lifting more than 1 gallon, no twisting, no bending forward.   WEIGHT BEARING RESTRICTIONS:  lifting restrictions as noted in precautions  FALLS:  Has patient fallen in last 6 months? No  LIVING ENVIRONMENT: Lives with: lives with their family Lives in: House/apartment Stairs: Yes: External: 5 steps; can reach both Has following equipment at home: Dan Humphreys - 2 wheeled  OCCUPATION: lifting/assembling furniture Health visitor)   PLOF: Independent and Independent with basic ADLs  PATIENT GOALS: return to PLOF, including work   NEXT MD VISIT: 11/26/22 to cardiology, 12/03/22 to PCP   OBJECTIVE:   DIAGNOSTIC FINDINGS:  10/15/22 DG THORACOLUMBAR SPINE (post op)  FINDINGS: Pedicle screws are noted at T9, T10, T11 and T12. Posterior fixation elements are not visualized at this time.   IMPRESSION: Pedicle screw placement from T9-T12 for fixation  surrounding a distraction injury at T10-T11.  PATIENT SURVEYS:  FOTO 27 current, 49 predicted  SCREENING FOR RED FLAGS: Bowel or bladder incontinence: Yes: present before accident related to CKD  Spinal tumors: No Cauda equina syndrome: No Compression fracture: No Abdominal aneurysm: No  COGNITION: Overall cognitive status: Within functional limits for tasks assessed      POSTURE: rounded shoulders, forward head, and use of TLSO limited further postural assessment  PALPATION: Not assessed at eval   LUMBAR ROM: not tested at eval d/t post op status   AROM eval  Flexion   Extension   Right lateral flexion   Left lateral flexion   Right rotation   Left rotation    (Blank rows = not tested)  Upper extremity MMT grossly: 4+/5 MMT bilaterally   LOWER EXTREMITY MMT:    MMT Right eval Left eval  Hip flexion 4+ 4+  Hip extension    Hip abduction 4+ 4+  Hip adduction 4 4  Hip internal rotation    Hip external rotation    Knee flexion 5 5  Knee extension 5 5  Ankle dorsiflexion 5 5  Ankle plantarflexion    Ankle inversion    Ankle eversion     (Blank rows = not tested)   FUNCTIONAL TESTS:  6 minute walk test: 425 ft  GAIT: Distance walked: 425 Assistive device utilized: Walker - 2 wheeled Level of assistance: Modified independence Comments: forward flexed posture, requires cueing for shoulder depression and normal breathing   [facility] Adult PT Treatment:  DATE: 12/17/2022  Therapeutic Exercise: nu-step L5 70m while taking subjective and planning session with patient Seated abdominal bracing with ball squeeze - 5 sec hold, 1 min x 2 Seated Pallof Press with green TB, 2 x 10 each side  Seated isometric band hold with varying perturbations from clinician  Seated pball press, 2 x 10  Standing in //  bars with bil support Walking marches x 3 laps  Heel raise - 20x Toe raise - 20x Hurdle stepping (3 hurdles and 1  airex) 2x fwd, 2x lateral   [facility] Adult PT Treatment:                                                DATE: 12/15/2022  Therapeutic Exercise: nu-step L5 54m while taking subjective and planning session with patient Seated abdominal bracing with ball squeeze - 5 sec hold x 20 Seated isometric abduction, with belt above knee x 20  Seated Pallof Press with green TB, 2 x 10 each side  Seated pball press, 2 x 10  Standing in //  bars with bil support Walking marches x 3 laps  Heel raise - 30x Toe raise - 20x Hurdle stepping 2x fwd, 2x lateral  [facility] Adult PT Treatment:                                                DATE: 12/11/2022   Therapeutic Exercise: nu-step L5 17m while taking subjective and planning session with patient Seated abdominal bracing with ball squeeze - 3 sec hold x 10 Seated isometric abduction, with belt above knee x 20  Standing in //  bars with bil support Mini-squats - 20x Walking marches x 3 laps  HS curl - 2x 10 Heel raise - 30x Toe raise - 20x Standing hip abd - x15 ea Hurdle stepping 1x fwd, 2x lateral   [facility] Adult PT Treatment:                                                DATE: 12/08/2022  Therapeutic Exercise:  nu-step L4 63m while taking subjective and planning session with patient Seated abdominal bracing with ball squeeze - 3 sec hold x 10  Seated isometric abduction, with belt above knee x 20  Standing in //  significant bil support Mini-knee bends - 20x Standing slow march 20x HS curl - 2x 10 Heel raise - 30x Standing hip abd - x15 ea Hurdle stepping 1x fwd, lateral  Continue to incorporate seated neutral spine core strengthening during rest breaks.     HOME EXERCISE PROGRAM: Access Code: [facility] URL: [email or web address] Date: 12/15/2022 Prepared by: Mauri Reading  Exercises - Seated Diaphragmatic Breathing  - 3 x daily - 7 x weekly - Seated Transversus Abdominis Bracing  - 5 x daily - 7 x weekly - 1 sets - 10 reps - 5  seg hold - Seated Hip Adduction Isometrics with Ball  - 5 x daily - 7 x weekly - 2 sets - 10 reps - 5 seg hold - Standing March with Counter Support  - 5 x daily -  7 x weekly - 2 sets - 5-10 reps - Standing Hip Abduction with Counter Support  - 5 x daily - 7 x weekly - 2 sets - 10 reps   ASSESSMENT:  CLINICAL IMPRESSION: Angelus is progressing well with skilled PT and was able to tolerate new core exercises with only minimal change in symptoms. He will continue to benefit from progression of program as tolerated without current restrictions.    OBJECTIVE IMPAIRMENTS: cardiopulmonary status limiting activity, decreased activity tolerance, decreased balance, decreased endurance, decreased mobility, difficulty walking, decreased strength, and pain.   ACTIVITY LIMITATIONS: carrying, lifting, bending, standing, squatting, sleeping, stairs, transfers, bed mobility, bathing, toileting, dressing, hygiene/grooming, and locomotion level  PARTICIPATION LIMITATIONS: meal prep, cleaning, interpersonal relationship, driving, shopping, community activity, occupation, and yard work  PERSONAL FACTORS: Past/current experiences, Profession, and 3+ comorbidities: PMHx includes CKD, CVA, HTN, OSA, Obesity   are also affecting patient's functional outcome.   REHAB POTENTIAL: Fair    CLINICAL DECISION MAKING: Evolving/moderate complexity  EVALUATION COMPLEXITY: Moderate   GOALS: Goals reviewed with patient? Yes  SHORT TERM GOALS: Target date: 12/19/2022  Patient will be independent with initial home program for core mm activation, and endurance program.  Baseline: provided at eval; to be progressed over next few tx sessions Goal status: INITIAL   LONG TERM GOALS: Target date: 02/13/2023   Patient will report improved overall functional ability with FOTO score of 49 or higher.   Baseline: 27 Goal status: INITIAL  2.  Patient will demonstrate ability to ascend/descend stairs and inclined surfaces  without pain or difficulty, using reciprocal pattern.  Goal status: INITIAL  3.  Patient will report that he is able to perform all ADLs and IADLs without assistance.  Goal status: INITIAL  4.  Patient will report ability to safely lifting at least 30# from floor to waist, and waist to counter height in order to ensure safe return to work activities.  Goal status: INITIAL   PLAN:  PT FREQUENCY: 2x/week, decrease freq to 1x/week after first 8 weeks.   PT DURATION: 12 weeks  PLANNED INTERVENTIONS: Therapeutic exercises, Therapeutic activity, Neuromuscular re-education, Balance training, Gait training, Patient/Family education, Self Care, Stair training, Aquatic Therapy, Cryotherapy, Moist heat, Manual therapy, and Re-evaluation.  PLAN FOR NEXT SESSION: provide new copy of HEP, focus on neutral spine core strengthening activities, LE endurance and pt education regarding activity modifications as appropriate   Mauri Reading, PT, DPT 12/17/2022, 1:52 PM

## 2022-12-22 ENCOUNTER — Ambulatory Visit: Payer: BC Managed Care – PPO

## 2022-12-22 DIAGNOSIS — M6281 Muscle weakness (generalized): Secondary | ICD-10-CM

## 2022-12-22 DIAGNOSIS — R262 Difficulty in walking, not elsewhere classified: Secondary | ICD-10-CM

## 2022-12-22 DIAGNOSIS — M5459 Other low back pain: Secondary | ICD-10-CM

## 2022-12-22 DIAGNOSIS — M546 Pain in thoracic spine: Secondary | ICD-10-CM

## 2022-12-22 NOTE — Therapy (Signed)
Treatment Note   Patient Name: Nicholas Potts MRN: [id number] DOB:03-10-1961, 62 y.o., male Today's Date: 12/22/2022   END OF SESSION:  PT End of Session - 12/22/22 1150     Visit Number 9    Number of Visits 21    Date for PT Re-Evaluation 02/13/23    Authorization Type [employer], 30 visit limit PT/OT/ST    PT Start Time 1145    PT Stop Time 1214    PT Time Calculation (min) 29 min    Activity Tolerance Patient tolerated treatment well;Patient limited by pain    Behavior During Therapy WFL for tasks assessed/performed                  Past Medical History:  Diagnosis Date   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HCC) 2014   post op    ARF (acute respiratory failure) (HCC) 2014   Blood dyscrasia    Chronic kidney disease 2014   ARF    Complication of anesthesia 2014   - post op - in combination of pain pills-  Cardiac and Respiratory Arrest- prior to sleep apnea diagonosis   Constipation    CVA (cerebral infarction)    Hyperlipidemia    Hypertension    OSA (obstructive sleep apnea)    Pre-diabetes    Short-term memory loss    Sleep apnea    Stress-induced cardiomyopathy -- RESOLVED    Post-op Narcotic induced Acute Hypoxic respiratory failure with respiratory and PEA cardiac arrest following accidental narcotic overdose; initial [place] 20-20%, now improved to 55%   Was supported with Impella Cardiac Cath - non-obstructive CAD ; 1 month after event - EF up from 25% to 55-60% - stable x 5 yrs   Thrombocytopenia (HCC) 2014   Past Surgical History:  Procedure Laterality Date   ACHILLES TENDON REPAIR     BIOPSY  08/01/2019   Procedure: BIOPSY;  Surgeon: Napoleon Form, MD;  Location: [facility];  Service: Endoscopy;;   BIOPSY  08/05/2020   Procedure: BIOPSY;  Surgeon: Napoleon Form, MD;  Location: [facility];  Service: Endoscopy;;   CARDIAC CATHETERIZATION  12/29/2012   RN LHC --> Impella. for severe shock with low EF. Mild to moderate RV pressure elevation.  Normal coronary arteries. Cardiac Output 2.5, Index 1.46.   COLONOSCOPY WITH PROPOFOL N/A 10/30/2015   Procedure: COLONOSCOPY WITH PROPOFOL;  Surgeon: Napoleon Form, MD;  Location: [facility];  Service: Endoscopy;  Laterality: N/A;   COLONOSCOPY WITH PROPOFOL N/A 08/01/2019   Procedure: COLONOSCOPY WITH PROPOFOL;  Surgeon: Napoleon Form, MD;  Location: [facility];  Service: Endoscopy;  Laterality: N/A;   COLONOSCOPY WITH PROPOFOL N/A 08/05/2020   Procedure: COLONOSCOPY WITH PROPOFOL;  Surgeon: Napoleon Form, MD;  Location: [facility];  Service: Endoscopy;  Laterality: N/A;   INSERTION OF DIALYSIS CATHETER Right 01/09/2013   Procedure: INSERTION OF DIALYSIS CATHETER;  Surgeon: Chuck Hint, MD;  Location: [facility] OR;  Service: Vascular;  Laterality: Right;   LAMINECTOMY WITH POSTERIOR LATERAL ARTHRODESIS LEVEL 3 N/A 10/15/2022   Procedure: THORACIC NINE-THORACIC TWELVE POSTERIOR INSTRUMENTED FUSION REDUCTION OF FRACTURE;  Surgeon: Bedelia Person, MD;  Location: [facility] OR;  Service: Neurosurgery;  Laterality: N/A;   LEFT AND RIGHT HEART CATHETERIZATION WITH CORONARY ANGIOGRAM  12/29/2012   Procedure: LEFT AND RIGHT HEART CATHETERIZATION WITH CORONARY ANGIOGRAM;  Surgeon: Tonny Bollman, MD;  Location: [facility] CATH LAB;  Service: Cardiovascular;;   POLYPECTOMY  08/01/2019   Procedure: POLYPECTOMY;  Surgeon: Napoleon Form, MD;  Location: [facility];  Service: Endoscopy;;   POLYPECTOMY  08/05/2020   Procedure: POLYPECTOMY;  Surgeon: Napoleon Form, MD;  Location: [facility];  Service: Endoscopy;;   TRANSTHORACIC ECHOCARDIOGRAM  12/29/2012   EF 25% with periapical HK/AKA.  --> Followup echo 7/25 -- EF of 35%   TRANSTHORACIC ECHOCARDIOGRAM  01/2013 ; 07/2015   a) EF 55-60%. No regional WMA. Grade 1 diastolic dysfunction. Mild LA dilatation.; b) Normal LV size with mild LV hypertrophy. EF 55-60%. Normal RV   TRANSTHORACIC ECHOCARDIOGRAM  03/2018   EF 55-60%.  NO RWMA.  Normal valves   Patient Active Problem List   Diagnosis Date Noted   Injury of thoracic spine (HCC) 10/22/2022   Dorsal (thoracic) vertebral fracture (HCC) 10/16/2022   Injury 10/15/2022   Urinary urgency 07/10/2022   Urinary frequency 07/10/2022   Morbid obesity with BMI of 40.0-44.9, adult (HCC) 09/10/2021   Fatigue 09/10/2021   History of colonic polyps    Polyp of ascending colon    Polyp of transverse colon    Polyp of cecum    Polyp of descending colon    Polyp of sigmoid colon    Hypersomnia 11/29/2017   Other headache syndrome 05/05/2017   Vertigo 05/05/2017   IFG (impaired fasting glucose) 08/21/2016   Obesity (BMI 30-39.9) 06/23/2013   Stress-induced cardiomyopathy -- essentially resolved    CVA (cerebral infarction)    OSA (obstructive sleep apnea)    Hyperlipidemia    Physical deconditioning 01/24/2013   Tinea pedis 01/18/2013   Essential hypertension 01/13/2013   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   ARF (acute renal failure) with tubular necrosis requiring HD 01/11/2013   Anemia 01/11/2013   Thrombocytopenia, unspecified (HCC) 01/11/2013   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 12/29/2012    PCP: Shade Flood, MD  REFERRING PROVIDER: Genice Rouge, MD  REFERRING DIAG: Unspecified fracture of unspecified thoracic vertebra, initial encounter for closed fracture [S22.009A], Unspecified injury at unspecified level of thoracic spinal cord, initial encounter [S24.109A]   Rationale for Evaluation and Treatment: Rehabilitation  THERAPY DIAG:  Pain in thoracic spine  Other low back pain  Muscle weakness (generalized)  Difficulty in walking, not elsewhere classified  ONSET DATE: 10/15/22  SUBJECTIVE:                                                                                                                                                                                           SUBJECTIVE  STATEMENT:  Patient reporting 8/10 pain at start of session. He reports some pain/soreness for about a day after the last session.  Interpretation provided by pt's daughter today per their preference; interpretation services denied.    PERTINENT HISTORY:   OP Date: 10/15/2022  2 weeks 10/29/2022  4 weeks 11/12/2022  6 weeks 11/26/2022  8 weeks 12/10/2022  10 weeks 12/24/2022  12 weeks 01/07/2023    open reduction of T10-11 fracture and posterior lateral arthrodesis of T9-03-19-11  Per note on 7/9: Patient and his daughter report that he went to see physician who updated his activity restrictions: "He can do more core strengthening, but no repetitive forward bending, sidebending, or twisting. He can lift more, but no more than 12#."   PMHx includes ARF, CKD, CVA, HTN, OSA, Obesity, Vertigo, Anemia, hx of cardiac and respiratory arrest related to unintentional narcotic overdose   PAIN:  Are you having pain? Yes: NPRS scale: 6-7.5/10 Pain location: along surgical site Pain description: sharp Aggravating factors: none identified at eval  Relieving factors: pain medication  PRECAUTIONS: Other: No lifting more than 1 gallon, no twisting, no bending forward.   WEIGHT BEARING RESTRICTIONS:  lifting restrictions as noted in precautions  FALLS:  Has patient fallen in last 6 months? No  LIVING ENVIRONMENT: Lives with: lives with their family Lives in: House/apartment Stairs: Yes: External: 5 steps; can reach both Has following equipment at home: Dan Humphreys - 2 wheeled  OCCUPATION: lifting/assembling furniture Health visitor)   PLOF: Independent and Independent with basic ADLs  PATIENT GOALS: return to PLOF, including work   NEXT MD VISIT: 11/26/22 to cardiology, 12/03/22 to PCP   OBJECTIVE:   DIAGNOSTIC FINDINGS:  10/15/22 DG THORACOLUMBAR SPINE (post op)  FINDINGS: Pedicle screws are noted at T9, T10, T11 and T12. Posterior fixation elements are not visualized at this time.    IMPRESSION: Pedicle screw placement from T9-T12 for fixation surrounding a distraction injury at T10-T11.  PATIENT SURVEYS:  FOTO 27 current, 49 predicted  SCREENING FOR RED FLAGS: Bowel or bladder incontinence: Yes: present before accident related to CKD  Spinal tumors: No Cauda equina syndrome: No Compression fracture: No Abdominal aneurysm: No  COGNITION: Overall cognitive status: Within functional limits for tasks assessed      POSTURE: rounded shoulders, forward head, and use of TLSO limited further postural assessment  PALPATION: Not assessed at eval   LUMBAR ROM: not tested at eval d/t post op status   AROM eval  Flexion   Extension   Right lateral flexion   Left lateral flexion   Right rotation   Left rotation    (Blank rows = not tested)  Upper extremity MMT grossly: 4+/5 MMT bilaterally   LOWER EXTREMITY MMT:    MMT Right eval Left eval  Hip flexion 4+ 4+  Hip extension    Hip abduction 4+ 4+  Hip adduction 4 4  Hip internal rotation    Hip external rotation    Knee flexion 5 5  Knee extension 5 5  Ankle dorsiflexion 5 5  Ankle plantarflexion    Ankle inversion    Ankle eversion     (Blank rows = not tested)   FUNCTIONAL TESTS:  6 minute walk test: 425 ft  GAIT: Distance walked: 425 Assistive device utilized: Walker - 2 wheeled Level of assistance: Modified independence Comments: forward flexed posture, requires cueing for shoulder depression and normal breathing   [facility] Adult PT Treatment:  DATE: 12/22/2022  Therapeutic Exercise: Seated abdominal bracing with ball squeeze - 5 sec hold x 1 min  Seated pball press, 3 sec hold, 2 x 10  Seated bilateral shoulder flexion with red fitter band, x 10 each side  Standing in //  bars with bil support Walking marches x 3 laps  Heel raise - 20x Toe raise - 20x Hurdle stepping (4 hurdles) 1x fwd, 1x lateral nu-step L5 77m while taking subjective and  planning session with patient   [facility] Adult PT Treatment:                                                DATE: 12/17/2022  Therapeutic Exercise: nu-step L5 72m while taking subjective and planning session with patient Seated abdominal bracing with ball squeeze - 5 sec hold, 1 min x 2 Seated Pallof Press with green TB, 2 x 10 each side  Seated isometric band hold with varying perturbations from clinician  Seated pball press, 2 x 10  Standing in //  bars with bil support Walking marches x 3 laps  Heel raise - 20x Toe raise - 20x Hurdle stepping (3 hurdles and 1 airex) 2x fwd, 2x lateral   [facility] Adult PT Treatment:                                                DATE: 12/15/2022  Therapeutic Exercise: nu-step L5 70m while taking subjective and planning session with patient Seated abdominal bracing with ball squeeze - 5 sec hold x 20 Seated isometric abduction, with belt above knee x 20  Seated Pallof Press with green TB, 2 x 10 each side  Seated pball press, 2 x 10  Standing in //  bars with bil support Walking marches x 3 laps  Heel raise - 30x Toe raise - 20x Hurdle stepping 2x fwd, 2x lateral    HOME EXERCISE PROGRAM: Access Code: [facility] URL: [email or web address] Date: 12/15/2022 Prepared by: Mauri Reading  Exercises - Seated Diaphragmatic Breathing  - 3 x daily - 7 x weekly - Seated Transversus Abdominis Bracing  - 5 x daily - 7 x weekly - 1 sets - 10 reps - 5 seg hold - Seated Hip Adduction Isometrics with Ball  - 5 x daily - 7 x weekly - 2 sets - 10 reps - 5 seg hold - Standing March with Counter Support  - 5 x daily - 7 x weekly - 2 sets - 5-10 reps - Standing Hip Abduction with Counter Support  - 5 x daily - 7 x weekly - 2 sets - 10 reps   ASSESSMENT:  CLINICAL IMPRESSION: Nicholas Potts did well with exercises today and reported decreased symptoms from 8/10 to 6/10 at end of session. We will continue to progress as appropriate.     OBJECTIVE IMPAIRMENTS:  cardiopulmonary status limiting activity, decreased activity tolerance, decreased balance, decreased endurance, decreased mobility, difficulty walking, decreased strength, and pain.   ACTIVITY LIMITATIONS: carrying, lifting, bending, standing, squatting, sleeping, stairs, transfers, bed mobility, bathing, toileting, dressing, hygiene/grooming, and locomotion level  PARTICIPATION LIMITATIONS: meal prep, cleaning, interpersonal relationship, driving, shopping, community activity, occupation, and yard work  PERSONAL FACTORS: Past/current experiences, Profession, and 3+ comorbidities: PMHx includes CKD, CVA, HTN,  OSA, Obesity   are also affecting patient's functional outcome.   REHAB POTENTIAL: Fair    CLINICAL DECISION MAKING: Evolving/moderate complexity  EVALUATION COMPLEXITY: Moderate   GOALS: Goals reviewed with patient? Yes  SHORT TERM GOALS: Target date: 12/19/2022  Patient will be independent with initial home program for core mm activation, and endurance program.  Baseline: provided at eval; to be progressed over next few tx sessions Goal status: INITIAL   LONG TERM GOALS: Target date: 02/13/2023   Patient will report improved overall functional ability with FOTO score of 49 or higher.   Baseline: 27 Goal status: INITIAL  2.  Patient will demonstrate ability to ascend/descend stairs and inclined surfaces without pain or difficulty, using reciprocal pattern.  Goal status: INITIAL  3.  Patient will report that he is able to perform all ADLs and IADLs without assistance.  Goal status: INITIAL  4.  Patient will report ability to safely lifting at least 30# from floor to waist, and waist to counter height in order to ensure safe return to work activities.  Goal status: INITIAL   PLAN:  PT FREQUENCY: 2x/week, decrease freq to 1x/week after first 8 weeks.   PT DURATION: 12 weeks  PLANNED INTERVENTIONS: Therapeutic exercises, Therapeutic activity, Neuromuscular re-education,  Balance training, Gait training, Patient/Family education, Self Care, Stair training, Aquatic Therapy, Cryotherapy, Moist heat, Manual therapy, and Re-evaluation.  PLAN FOR NEXT SESSION: focus on neutral spine core strengthening activities, LE endurance and pt education regarding activity modifications as appropriate   Mauri Reading, PT, DPT 12/22/2022, 1:49 PM

## 2022-12-24 ENCOUNTER — Ambulatory Visit: Payer: BC Managed Care – PPO

## 2022-12-24 DIAGNOSIS — M6281 Muscle weakness (generalized): Secondary | ICD-10-CM

## 2022-12-24 DIAGNOSIS — M5459 Other low back pain: Secondary | ICD-10-CM

## 2022-12-24 DIAGNOSIS — M546 Pain in thoracic spine: Secondary | ICD-10-CM

## 2022-12-24 DIAGNOSIS — R262 Difficulty in walking, not elsewhere classified: Secondary | ICD-10-CM

## 2022-12-24 NOTE — Therapy (Signed)
Treatment Note   Patient Name: Nicholas Potts MRN: [id number] DOB:[birth year], 62 y.o., male Today's Date: 12/24/2022   END OF SESSION:  PT End of Session - 12/24/22 1155     Visit Number 10    Number of Visits 21    Date for PT Re-Evaluation 02/13/23    Authorization Type [employer], 30 visit limit PT/OT/ST    PT Start Time 1139    PT Stop Time 1215    PT Time Calculation (min) 36 min    Equipment Utilized During Treatment Back brace    Activity Tolerance Patient tolerated treatment well;Patient limited by pain    Behavior During Therapy WFL for tasks assessed/performed                   Past Medical History:  Diagnosis Date   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HCC) 2014   post op    ARF (acute respiratory failure) (HCC) 2014   Blood dyscrasia    Chronic kidney disease 2014   ARF    Complication of anesthesia 2014   - post op - in combination of pain pills-  Cardiac and Respiratory Arrest- prior to sleep apnea diagonosis   Constipation    CVA (cerebral infarction)    Hyperlipidemia    Hypertension    OSA (obstructive sleep apnea)    Pre-diabetes    Short-term memory loss    Sleep apnea    Stress-induced cardiomyopathy -- RESOLVED    Post-op Narcotic induced Acute Hypoxic respiratory failure with respiratory and PEA cardiac arrest following accidental narcotic overdose; initial [place] 20-20%, now improved to 55%   Was supported with Impella Cardiac Cath - non-obstructive CAD ; 1 month after event - EF up from 25% to 55-60% - stable x 5 yrs   Thrombocytopenia (HCC) 2014   Past Surgical History:  Procedure Laterality Date   ACHILLES TENDON REPAIR     BIOPSY  08/01/2019   Procedure: BIOPSY;  Surgeon: Napoleon Form, MD;  Location: [facility];  Service: Endoscopy;;   BIOPSY  08/05/2020   Procedure: BIOPSY;  Surgeon: Napoleon Form, MD;  Location: [facility];  Service: Endoscopy;;   CARDIAC CATHETERIZATION  12/29/2012   RN LHC --> Impella. for severe shock  with low EF. Mild to moderate RV pressure elevation. Normal coronary arteries. Cardiac Output 2.5, Index 1.46.   COLONOSCOPY WITH PROPOFOL N/A 10/30/2015   Procedure: COLONOSCOPY WITH PROPOFOL;  Surgeon: Napoleon Form, MD;  Location: [facility];  Service: Endoscopy;  Laterality: N/A;   COLONOSCOPY WITH PROPOFOL N/A 08/01/2019   Procedure: COLONOSCOPY WITH PROPOFOL;  Surgeon: Napoleon Form, MD;  Location: [facility];  Service: Endoscopy;  Laterality: N/A;   COLONOSCOPY WITH PROPOFOL N/A 08/05/2020   Procedure: COLONOSCOPY WITH PROPOFOL;  Surgeon: Napoleon Form, MD;  Location: [facility];  Service: Endoscopy;  Laterality: N/A;   INSERTION OF DIALYSIS CATHETER Right 01/09/2013   Procedure: INSERTION OF DIALYSIS CATHETER;  Surgeon: Chuck Hint, MD;  Location: [facility] OR;  Service: Vascular;  Laterality: Right;   LAMINECTOMY WITH POSTERIOR LATERAL ARTHRODESIS LEVEL 3 N/A 10/15/2022   Procedure: THORACIC NINE-THORACIC TWELVE POSTERIOR INSTRUMENTED FUSION REDUCTION OF FRACTURE;  Surgeon: Bedelia Person, MD;  Location: [facility] OR;  Service: Neurosurgery;  Laterality: N/A;   LEFT AND RIGHT HEART CATHETERIZATION WITH CORONARY ANGIOGRAM  12/29/2012   Procedure: LEFT AND RIGHT HEART CATHETERIZATION WITH CORONARY ANGIOGRAM;  Surgeon: Tonny Bollman, MD;  Location[facility] CATH LAB;  Service: Cardiovascular;;   POLYPECTOMY  08/01/2019  Procedure: POLYPECTOMY;  Surgeon: Napoleon Form, MD;  Location: [facility];  Service: Endoscopy;;   POLYPECTOMY  08/05/2020   Procedure: POLYPECTOMY;  Surgeon: Napoleon Form, MD;  Location: [facility];  Service: Endoscopy;;   TRANSTHORACIC ECHOCARDIOGRAM  12/29/2012   EF 25% with periapical HK/AKA.  --> Followup echo 7/25 -- EF of 35%   TRANSTHORACIC ECHOCARDIOGRAM  01/2013 ; 07/2015   a) EF 55-60%. No regional WMA. Grade 1 diastolic dysfunction. Mild LA dilatation.; b) Normal LV size with mild LV hypertrophy. EF 55-60%. Normal RV    TRANSTHORACIC ECHOCARDIOGRAM  03/2018   EF 55-60%.  NO RWMA. Normal valves   Patient Active Problem List   Diagnosis Date Noted   Injury of thoracic spine (HCC) 10/22/2022   Dorsal (thoracic) vertebral fracture (HCC) 10/16/2022   Injury 10/15/2022   Urinary urgency 07/10/2022   Urinary frequency 07/10/2022   Morbid obesity with BMI of 40.0-44.9, adult (HCC) 09/10/2021   Fatigue 09/10/2021   History of colonic polyps    Polyp of ascending colon    Polyp of transverse colon    Polyp of cecum    Polyp of descending colon    Polyp of sigmoid colon    Hypersomnia 11/29/2017   Other headache syndrome 05/05/2017   Vertigo 05/05/2017   IFG (impaired fasting glucose) 08/21/2016   Obesity (BMI 30-39.9) 06/23/2013   Stress-induced cardiomyopathy -- essentially resolved    CVA (cerebral infarction)    OSA (obstructive sleep apnea)    Hyperlipidemia    Physical deconditioning 01/24/2013   Tinea pedis 01/18/2013   Essential hypertension 01/13/2013   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   ARF (acute renal failure) with tubular necrosis requiring HD 01/11/2013   Anemia 01/11/2013   Thrombocytopenia, unspecified (HCC) 01/11/2013   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 12/29/2012    PCP: Shade Flood, MD  REFERRING PROVIDER: Genice Rouge, MD  REFERRING DIAG: Unspecified fracture of unspecified thoracic vertebra, initial encounter for closed fracture [S22.009A], Unspecified injury at unspecified level of thoracic spinal cord, initial encounter [S24.109A]   Rationale for Evaluation and Treatment: Rehabilitation  THERAPY DIAG:  Pain in thoracic spine  Other low back pain  Muscle weakness (generalized)  Difficulty in walking, not elsewhere classified  ONSET DATE: 10/15/22  SUBJECTIVE:                                                                                                                                                                                            SUBJECTIVE STATEMENT:  Patient reporting 7/10 pain at start of session. He reports pain with  laying down on his back which is getting annoying.   Interpretation provided by pt's daughter today per their preference; interpretation services denied.    PERTINENT HISTORY:   OP Date: 10/15/2022  2 weeks 10/29/2022  4 weeks 11/12/2022  6 weeks 11/26/2022  8 weeks 12/10/2022  10 weeks 12/24/2022  12 weeks 01/07/2023    open reduction of T10-11 fracture and posterior lateral arthrodesis of T9-03-19-11  Per note on 7/9: Patient and his daughter report that he went to see physician who updated his activity restrictions: "He can do more core strengthening, but no repetitive forward bending, sidebending, or twisting. He can lift more, but no more than 12#."   PMHx includes ARF, CKD, CVA, HTN, OSA, Obesity, Vertigo, Anemia, hx of cardiac and respiratory arrest related to unintentional narcotic overdose   PAIN:  Are you having pain? Yes: NPRS scale: 6-7.5/10 Pain location: along surgical site Pain description: sharp Aggravating factors: none identified at eval  Relieving factors: pain medication  PRECAUTIONS: Other: No lifting more than 1 gallon, no twisting, no bending forward.   WEIGHT BEARING RESTRICTIONS:  lifting restrictions as noted in precautions  FALLS:  Has patient fallen in last 6 months? No  LIVING ENVIRONMENT: Lives with: lives with their family Lives in: House/apartment Stairs: Yes: External: 5 steps; can reach both Has following equipment at home: Dan Humphreys - 2 wheeled  OCCUPATION: lifting/assembling furniture Health visitor)   PLOF: Independent and Independent with basic ADLs  PATIENT GOALS: return to PLOF, including work   NEXT MD VISIT: 11/26/22 to cardiology, 12/03/22 to PCP   OBJECTIVE:   DIAGNOSTIC FINDINGS:  10/15/22 DG THORACOLUMBAR SPINE (post op)  FINDINGS: Pedicle screws are noted at T9, T10, T11 and T12. Posterior  fixation elements are not visualized at this time.   IMPRESSION: Pedicle screw placement from T9-T12 for fixation surrounding a distraction injury at T10-T11.  PATIENT SURVEYS:  FOTO 27 current, 49 predicted  SCREENING FOR RED FLAGS: Bowel or bladder incontinence: Yes: present before accident related to CKD  Spinal tumors: No Cauda equina syndrome: No Compression fracture: No Abdominal aneurysm: No  COGNITION: Overall cognitive status: Within functional limits for tasks assessed      POSTURE: rounded shoulders, forward head, and use of TLSO limited further postural assessment  PALPATION: Not assessed at eval   LUMBAR ROM: not tested at eval d/t post op status   AROM eval  Flexion   Extension   Right lateral flexion   Left lateral flexion   Right rotation   Left rotation    (Blank rows = not tested)  Upper extremity MMT grossly: 4+/5 MMT bilaterally   LOWER EXTREMITY MMT:    MMT Right eval Left eval  Hip flexion 4+ 4+  Hip extension    Hip abduction 4+ 4+  Hip adduction 4 4  Hip internal rotation    Hip external rotation    Knee flexion 5 5  Knee extension 5 5  Ankle dorsiflexion 5 5  Ankle plantarflexion    Ankle inversion    Ankle eversion     (Blank rows = not tested)   FUNCTIONAL TESTS:  6 minute walk test: 425 ft  GAIT: Distance walked: 425 Assistive device utilized: Walker - 2 wheeled Level of assistance: Modified independence Comments: forward flexed posture, requires cueing for shoulder depression and normal breathing  TODAY'S TREATMENT:   [facility] Adult PT Treatment:  DATE: 12/24/2022  Therapeutic Exercise: Seated abdominal bracing with ball squeeze - 5 sec hold x 20  Seated pball press, 3 sec hold, 2 x 10  Seated bilateral shoulder flexion with red fitter band (resistance via clinician), x 10 each side  Standing in //  bars with bil support Walking marches x 2 laps  Heel raise - 30x Toe  raise - 30x Hurdle step over (2 hurdles) 2x fwd, 2x lateral, with airex pad in between hurdles Cone taps, 2 x 1 minutes with mirror for visual feedback  nu-step L5 66m while taking subjective and planning session with patient   [facility] Adult PT Treatment:                                                DATE: 12/22/2022  Therapeutic Exercise: Seated abdominal bracing with ball squeeze - 5 sec hold x 1 min  Seated pball press, 3 sec hold, 2 x 10  Seated bilateral shoulder flexion with red fitter band, x 10 each side  Standing in //  bars with bil support Walking marches x 3 laps  Heel raise - 20x Toe raise - 20x Hurdle stepping (4 hurdles) 1x fwd, 1x lateral nu-step L5 34m while taking subjective and planning session with patient   [facility] Adult PT Treatment:                                                DATE: 12/17/2022  Therapeutic Exercise: nu-step L5 23m while taking subjective and planning session with patient Seated abdominal bracing with ball squeeze - 5 sec hold, 1 min x 2 Seated Pallof Press with green TB, 2 x 10 each side  Seated isometric band hold with varying perturbations from clinician  Seated pball press, 2 x 10  Standing in //  bars with bil support Walking marches x 3 laps  Heel raise - 20x Toe raise - 20x Hurdle stepping (3 hurdles and 1 airex) 2x fwd, 2x lateral     HOME EXERCISE PROGRAM: Access Code: [facility] URL: [email or web address] Date: 12/15/2022 Prepared by: Mauri Reading  Exercises - Seated Diaphragmatic Breathing  - 3 x daily - 7 x weekly - Seated Transversus Abdominis Bracing  - 5 x daily - 7 x weekly - 1 sets - 10 reps - 5 seg hold - Seated Hip Adduction Isometrics with Ball  - 5 x daily - 7 x weekly - 2 sets - 10 reps - 5 seg hold - Standing March with Counter Support  - 5 x daily - 7 x weekly - 2 sets - 5-10 reps - Standing Hip Abduction with Counter Support  - 5 x daily - 7 x weekly - 2 sets - 10 reps   ASSESSMENT:  CLINICAL  IMPRESSION: Jayven requires less frequent cueing for breathing and TA activation. However, he does benefit from cueing for breathing with new exercises. Pt denies change in pain severity at end of session. We will continue to progress neutral spine core strengthening, encouraging TA activation with weight bearing activities, and aerobic activities for pain modulation benefits and overall improved activity tolerance.     OBJECTIVE IMPAIRMENTS: cardiopulmonary status limiting activity, decreased activity tolerance, decreased balance, decreased endurance, decreased mobility, difficulty walking, decreased  strength, and pain.   ACTIVITY LIMITATIONS: carrying, lifting, bending, standing, squatting, sleeping, stairs, transfers, bed mobility, bathing, toileting, dressing, hygiene/grooming, and locomotion level  PARTICIPATION LIMITATIONS: meal prep, cleaning, interpersonal relationship, driving, shopping, community activity, occupation, and yard work  PERSONAL FACTORS: Past/current experiences, Profession, and 3+ comorbidities: PMHx includes CKD, CVA, HTN, OSA, Obesity   are also affecting patient's functional outcome.   REHAB POTENTIAL: Fair    CLINICAL DECISION MAKING: Evolving/moderate complexity  EVALUATION COMPLEXITY: Moderate   GOALS: Goals reviewed with patient? Yes  SHORT TERM GOALS: Target date: 12/19/2022  Patient will be independent with initial home program for core mm activation, and endurance program.  Baseline: provided at eval; to be progressed over next few tx sessions Goal status: MET   LONG TERM GOALS: Target date: 02/13/2023   Patient will report improved overall functional ability with FOTO score of 49 or higher.   Baseline: 27 Goal status: INITIAL  2.  Patient will demonstrate ability to ascend/descend stairs and inclined surfaces without pain or difficulty, using reciprocal pattern.  Goal status: INITIAL  3.  Patient will report that he is able to perform all ADLs  and IADLs without assistance.  Goal status: INITIAL  4.  Patient will report ability to safely lifting at least 30# from floor to waist, and waist to counter height in order to ensure safe return to work activities.  Goal status: INITIAL   PLAN:  PT FREQUENCY: 2x/week, decrease freq to 1x/week after first 8 weeks.   PT DURATION: 12 weeks  PLANNED INTERVENTIONS: Therapeutic exercises, Therapeutic activity, Neuromuscular re-education, Balance training, Gait training, Patient/Family education, Self Care, Stair training, Aquatic Therapy, Cryotherapy, Moist heat, Manual therapy, and Re-evaluation.  PLAN FOR NEXT SESSION: focus on neutral spine core strengthening activities, LE endurance and pt education regarding activity modifications as appropriate   Mauri Reading, PT, DPT 12/24/2022, 12:33 PM

## 2022-12-29 ENCOUNTER — Ambulatory Visit: Payer: BC Managed Care – PPO

## 2022-12-29 DIAGNOSIS — M546 Pain in thoracic spine: Secondary | ICD-10-CM | POA: Diagnosis not present

## 2022-12-29 DIAGNOSIS — M6281 Muscle weakness (generalized): Secondary | ICD-10-CM

## 2022-12-29 DIAGNOSIS — R262 Difficulty in walking, not elsewhere classified: Secondary | ICD-10-CM

## 2022-12-29 DIAGNOSIS — M5459 Other low back pain: Secondary | ICD-10-CM

## 2022-12-29 NOTE — Therapy (Signed)
Treatment Note   Patient Name: Nicholas Potts MRN: [id number] DOB:March 30, 1961, 62 y.o., male Today's Date: 12/29/2022   END OF SESSION:  PT End of Session - 12/29/22 1312     Visit Number 11    Number of Visits 21    Date for PT Re-Evaluation 02/13/23    Authorization Type [employer], 30 visit limit PT/OT/ST    PT Start Time 1312   Pt arrived 12 mins late   PT Stop Time 1345    PT Time Calculation (min) 33 min    Equipment Utilized During Treatment Back brace    Activity Tolerance Patient tolerated treatment well;Patient limited by pain    Behavior During Therapy WFL for tasks assessed/performed                    Past Medical History:  Diagnosis Date   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HCC) 2014   post op    ARF (acute respiratory failure) (HCC) 2014   Blood dyscrasia    Chronic kidney disease 2014   ARF    Complication of anesthesia 2014   - post op - in combination of pain pills-  Cardiac and Respiratory Arrest- prior to sleep apnea diagonosis   Constipation    CVA (cerebral infarction)    Hyperlipidemia    Hypertension    OSA (obstructive sleep apnea)    Pre-diabetes    Short-term memory loss    Sleep apnea    Stress-induced cardiomyopathy -- RESOLVED    Post-op Narcotic induced Acute Hypoxic respiratory failure with respiratory and PEA cardiac arrest following accidental narcotic overdose; initial [place] 20-20%, now improved to 55%   Was supported with Impella Cardiac Cath - non-obstructive CAD ; 1 month after event - EF up from 25% to 55-60% - stable x 5 yrs   Thrombocytopenia (HCC) 2014   Past Surgical History:  Procedure Laterality Date   ACHILLES TENDON REPAIR     BIOPSY  08/01/2019   Procedure: BIOPSY;  Surgeon: Napoleon Form, MD;  Location: [facility];  Service: Endoscopy;;   BIOPSY  08/05/2020   Procedure: BIOPSY;  Surgeon: Napoleon Form, MD;  Location: [facility];  Service: Endoscopy;;   CARDIAC CATHETERIZATION  12/29/2012   RN LHC -->  Impella. for severe shock with low EF. Mild to moderate RV pressure elevation. Normal coronary arteries. Cardiac Output 2.5, Index 1.46.   COLONOSCOPY WITH PROPOFOL N/A 10/30/2015   Procedure: COLONOSCOPY WITH PROPOFOL;  Surgeon: Napoleon Form, MD;  Location: [facility];  Service: Endoscopy;  Laterality: N/A;   COLONOSCOPY WITH PROPOFOL N/A 08/01/2019   Procedure: COLONOSCOPY WITH PROPOFOL;  Surgeon: Napoleon Form, MD;  Location: [facility];  Service: Endoscopy;  Laterality: N/A;   COLONOSCOPY WITH PROPOFOL N/A 08/05/2020   Procedure: COLONOSCOPY WITH PROPOFOL;  Surgeon: Napoleon Form, MD;  Location: [facility];  Service: Endoscopy;  Laterality: N/A;   INSERTION OF DIALYSIS CATHETER Right 01/09/2013   Procedure: INSERTION OF DIALYSIS CATHETER;  Surgeon: Chuck Hint, MD;  Location: [facility] OR;  Service: Vascular;  Laterality: Right;   LAMINECTOMY WITH POSTERIOR LATERAL ARTHRODESIS LEVEL 3 N/A 10/15/2022   Procedure: THORACIC NINE-THORACIC TWELVE POSTERIOR INSTRUMENTED FUSION REDUCTION OF FRACTURE;  Surgeon: Bedelia Person, MD;  Location: [facility] OR;  Service: Neurosurgery;  Laterality: N/A;   LEFT AND RIGHT HEART CATHETERIZATION WITH CORONARY ANGIOGRAM  12/29/2012   Procedure: LEFT AND RIGHT HEART CATHETERIZATION WITH CORONARY ANGIOGRAM;  Surgeon: Tonny Bollman, MD;  Location: [facility] CATH LAB;  Service:  Cardiovascular;;   POLYPECTOMY  08/01/2019   Procedure: POLYPECTOMY;  Surgeon: Napoleon Form, MD;  Location: [facility];  Service: Endoscopy;;   POLYPECTOMY  08/05/2020   Procedure: POLYPECTOMY;  Surgeon: Napoleon Form, MD;  Location: [facility];  Service: Endoscopy;;   TRANSTHORACIC ECHOCARDIOGRAM  12/29/2012   EF 25% with periapical HK/AKA.  --> Followup echo 7/25 -- EF of 35%   TRANSTHORACIC ECHOCARDIOGRAM  01/2013 ; 07/2015   a) EF 55-60%. No regional WMA. Grade 1 diastolic dysfunction. Mild LA dilatation.; b) Normal LV size with mild LV hypertrophy. EF  55-60%. Normal RV   TRANSTHORACIC ECHOCARDIOGRAM  03/2018   EF 55-60%.  NO RWMA. Normal valves   Patient Active Problem List   Diagnosis Date Noted   Injury of thoracic spine (HCC) 10/22/2022   Dorsal (thoracic) vertebral fracture (HCC) 10/16/2022   Injury 10/15/2022   Urinary urgency 07/10/2022   Urinary frequency 07/10/2022   Morbid obesity with BMI of 40.0-44.9, adult (HCC) 09/10/2021   Fatigue 09/10/2021   History of colonic polyps    Polyp of ascending colon    Polyp of transverse colon    Polyp of cecum    Polyp of descending colon    Polyp of sigmoid colon    Hypersomnia 11/29/2017   Other headache syndrome 05/05/2017   Vertigo 05/05/2017   IFG (impaired fasting glucose) 08/21/2016   Obesity (BMI 30-39.9) 06/23/2013   Stress-induced cardiomyopathy -- essentially resolved    CVA (cerebral infarction)    OSA (obstructive sleep apnea)    Hyperlipidemia    Physical deconditioning 01/24/2013   Tinea pedis 01/18/2013   Essential hypertension 01/13/2013   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   ARF (acute renal failure) with tubular necrosis requiring HD 01/11/2013   Anemia 01/11/2013   Thrombocytopenia, unspecified (HCC) 01/11/2013   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 12/29/2012    PCP: Shade Flood, MD  REFERRING PROVIDER: Genice Rouge, MD  REFERRING DIAG: Unspecified fracture of unspecified thoracic vertebra, initial encounter for closed fracture [S22.009A], Unspecified injury at unspecified level of thoracic spinal cord, initial encounter [S24.109A]   Rationale for Evaluation and Treatment: Rehabilitation  THERAPY DIAG:  Pain in thoracic spine  Other low back pain  Muscle weakness (generalized)  Difficulty in walking, not elsewhere classified  ONSET DATE: 10/15/22  SUBJECTIVE:                                                                                                                                                                                            SUBJECTIVE STATEMENT:  Patient states his pain  is at a 6/10 today.   Interpretation provided by pt's daughter today per their preference; interpretation services denied.    PERTINENT HISTORY:   OP Date: 10/15/2022  2 weeks 10/29/2022  4 weeks 11/12/2022  6 weeks 11/26/2022  8 weeks 12/10/2022  10 weeks 12/24/2022  12 weeks 01/07/2023    open reduction of T10-11 fracture and posterior lateral arthrodesis of T9-03-19-11  Per note on 7/9: Patient and his daughter report that he went to see physician who updated his activity restrictions: "He can do more core strengthening, but no repetitive forward bending, sidebending, or twisting. He can lift more, but no more than 12#."   PMHx includes ARF, CKD, CVA, HTN, OSA, Obesity, Vertigo, Anemia, hx of cardiac and respiratory arrest related to unintentional narcotic overdose   PAIN:  Are you having pain? Yes: NPRS scale: 6-7.5/10 Pain location: along surgical site Pain description: sharp Aggravating factors: none identified at eval  Relieving factors: pain medication  PRECAUTIONS: Other: No lifting more than 1 gallon, no twisting, no bending forward.   WEIGHT BEARING RESTRICTIONS:  lifting restrictions as noted in precautions  FALLS:  Has patient fallen in last 6 months? No  LIVING ENVIRONMENT: Lives with: lives with their family Lives in: House/apartment Stairs: Yes: External: 5 steps; can reach both Has following equipment at home: Dan Humphreys - 2 wheeled  OCCUPATION: lifting/assembling furniture Health visitor)   PLOF: Independent and Independent with basic ADLs  PATIENT GOALS: return to PLOF, including work   NEXT MD VISIT: 11/26/22 to cardiology, 12/03/22 to PCP   OBJECTIVE:   DIAGNOSTIC FINDINGS:  10/15/22 DG THORACOLUMBAR SPINE (post op)  FINDINGS: Pedicle screws are noted at T9, T10, T11 and T12. Posterior fixation elements are not visualized at this time.    IMPRESSION: Pedicle screw placement from T9-T12 for fixation surrounding a distraction injury at T10-T11.  PATIENT SURVEYS:  FOTO 27 current, 49 predicted  SCREENING FOR RED FLAGS: Bowel or bladder incontinence: Yes: present before accident related to CKD  Spinal tumors: No Cauda equina syndrome: No Compression fracture: No Abdominal aneurysm: No  COGNITION: Overall cognitive status: Within functional limits for tasks assessed      POSTURE: rounded shoulders, forward head, and use of TLSO limited further postural assessment  PALPATION: Not assessed at eval   LUMBAR ROM: not tested at eval d/t post op status   AROM eval  Flexion   Extension   Right lateral flexion   Left lateral flexion   Right rotation   Left rotation    (Blank rows = not tested)  Upper extremity MMT grossly: 4+/5 MMT bilaterally   LOWER EXTREMITY MMT:    MMT Right eval Left eval  Hip flexion 4+ 4+  Hip extension    Hip abduction 4+ 4+  Hip adduction 4 4  Hip internal rotation    Hip external rotation    Knee flexion 5 5  Knee extension 5 5  Ankle dorsiflexion 5 5  Ankle plantarflexion    Ankle inversion    Ankle eversion     (Blank rows = not tested)   FUNCTIONAL TESTS:  6 minute walk test: 425 ft  GAIT: Distance walked: 425 Assistive device utilized: Walker - 2 wheeled Level of assistance: Modified independence Comments: forward flexed posture, requires cueing for shoulder depression and normal breathing  TODAY'S TREATMENT:  [facility] Adult PT Treatment:  DATE: 12/29/22 Therapeutic Exercise: nu-step L5 72m while taking subjective and planning session with patient Seated bilateral shoulder flexion with blue fitter band (resistance via clinician), x 10 each side  Standing in //  bars with bil support Walking marches x 3 laps  Heel raise - 30x Toe raise - 30x Hurdle step over (2 hurdles) 2x fwd, 2x lateral, with airex pad in between  hurdles   [facility] Adult PT Treatment:                                                DATE: 12/24/2022  Therapeutic Exercise: Seated abdominal bracing with ball squeeze - 5 sec hold x 20  Seated pball press, 3 sec hold, 2 x 10  Seated bilateral shoulder flexion with red fitter band (resistance via clinician), x 10 each side  Standing in //  bars with bil support Walking marches x 2 laps  Heel raise - 30x Toe raise - 30x Hurdle step over (2 hurdles) 2x fwd, 2x lateral, with airex pad in between hurdles Cone taps, 2 x 1 minutes with mirror for visual feedback  nu-step L5 45m while taking subjective and planning session with patient   [facility] Adult PT Treatment:                                                DATE: 12/22/2022  Therapeutic Exercise: Seated abdominal bracing with ball squeeze - 5 sec hold x 1 min  Seated pball press, 3 sec hold, 2 x 10  Seated bilateral shoulder flexion with red fitter band, x 10 each side  Standing in //  bars with bil support Walking marches x 3 laps  Heel raise - 20x Toe raise - 20x Hurdle stepping (4 hurdles) 1x fwd, 1x lateral nu-step L5 80m while taking subjective and planning session with patient     HOME EXERCISE PROGRAM: Access Code: [facility] URL: [email or web address] Date: 12/15/2022 Prepared by: Mauri Reading  Exercises - Seated Diaphragmatic Breathing  - 3 x daily - 7 x weekly - Seated Transversus Abdominis Bracing  - 5 x daily - 7 x weekly - 1 sets - 10 reps - 5 seg hold - Seated Hip Adduction Isometrics with Ball  - 5 x daily - 7 x weekly - 2 sets - 10 reps - 5 seg hold - Standing March with Counter Support  - 5 x daily - 7 x weekly - 2 sets - 5-10 reps - Standing Hip Abduction with Counter Support  - 5 x daily - 7 x weekly - 2 sets - 10 reps   ASSESSMENT:  CLINICAL IMPRESSION: Patient present to PT 12 minutes late, truncating session, reporting continued back pain, but that he slept well last night. Session today  continued to focus on strengthening within spinal precautions. He has increased pain with standing activities, needing occasional rest breaks. Patient continues to benefit from skilled PT services and should be progressed as able to improve functional independence.     OBJECTIVE IMPAIRMENTS: cardiopulmonary status limiting activity, decreased activity tolerance, decreased balance, decreased endurance, decreased mobility, difficulty walking, decreased strength, and pain.   ACTIVITY LIMITATIONS: carrying, lifting, bending, standing, squatting, sleeping, stairs, transfers, bed mobility, bathing, toileting, dressing, hygiene/grooming, and locomotion level  PARTICIPATION LIMITATIONS: meal prep, cleaning, interpersonal relationship, driving, shopping, community activity, occupation, and yard work  PERSONAL FACTORS: Past/current experiences, Profession, and 3+ comorbidities: PMHx includes CKD, CVA, HTN, OSA, Obesity   are also affecting patient's functional outcome.   REHAB POTENTIAL: Fair    CLINICAL DECISION MAKING: Evolving/moderate complexity  EVALUATION COMPLEXITY: Moderate   GOALS: Goals reviewed with patient? Yes  SHORT TERM GOALS: Target date: 12/19/2022  Patient will be independent with initial home program for core mm activation, and endurance program.  Baseline: provided at eval; to be progressed over next few tx sessions Goal status: MET   LONG TERM GOALS: Target date: 02/13/2023   Patient will report improved overall functional ability with FOTO score of 49 or higher.   Baseline: 27 Goal status: INITIAL  2.  Patient will demonstrate ability to ascend/descend stairs and inclined surfaces without pain or difficulty, using reciprocal pattern.  Goal status: INITIAL  3.  Patient will report that he is able to perform all ADLs and IADLs without assistance.  Goal status: INITIAL  4.  Patient will report ability to safely lifting at least 30# from floor to waist, and waist to  counter height in order to ensure safe return to work activities.  Goal status: INITIAL   PLAN:  PT FREQUENCY: 2x/week, decrease freq to 1x/week after first 8 weeks.   PT DURATION: 12 weeks  PLANNED INTERVENTIONS: Therapeutic exercises, Therapeutic activity, Neuromuscular re-education, Balance training, Gait training, Patient/Family education, Self Care, Stair training, Aquatic Therapy, Cryotherapy, Moist heat, Manual therapy, and Re-evaluation.  PLAN FOR NEXT SESSION: focus on neutral spine core strengthening activities, LE endurance and pt education regarding activity modifications as appropriate   Berta Minor, PTA 12/29/2022, 1:46 PM

## 2022-12-31 ENCOUNTER — Ambulatory Visit: Payer: BC Managed Care – PPO

## 2022-12-31 DIAGNOSIS — S22078A Other fracture of T9-T10 vertebra, initial encounter for closed fracture: Secondary | ICD-10-CM

## 2022-12-31 DIAGNOSIS — M6281 Muscle weakness (generalized): Secondary | ICD-10-CM

## 2022-12-31 DIAGNOSIS — M5459 Other low back pain: Secondary | ICD-10-CM

## 2022-12-31 DIAGNOSIS — R262 Difficulty in walking, not elsewhere classified: Secondary | ICD-10-CM

## 2022-12-31 DIAGNOSIS — M546 Pain in thoracic spine: Secondary | ICD-10-CM | POA: Diagnosis not present

## 2022-12-31 NOTE — Therapy (Signed)
Treatment Note   Patient Name: Nicholas Potts MRN: [id number] DOB:1960-06-15, 62 y.o., male Today's Date: 12/31/2022   END OF SESSION:  PT End of Session - 12/31/22 1055     Visit Number 12    Number of Visits 21    Date for PT Re-Evaluation 02/13/23    Authorization Type [employer], 30 visit limit PT/OT/ST    PT Start Time 1055    PT Stop Time 1135    PT Time Calculation (min) 40 min    Equipment Utilized During Treatment Back brace    Activity Tolerance Patient tolerated treatment well;Patient limited by pain    Behavior During Therapy WFL for tasks assessed/performed             Past Medical History:  Diagnosis Date   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HCC) 2014   post op    ARF (acute respiratory failure) (HCC) 2014   Blood dyscrasia    Chronic kidney disease 2014   ARF    Complication of anesthesia 2014   - post op - in combination of pain pills-  Cardiac and Respiratory Arrest- prior to sleep apnea diagonosis   Constipation    CVA (cerebral infarction)    Hyperlipidemia    Hypertension    OSA (obstructive sleep apnea)    Pre-diabetes    Short-term memory loss    Sleep apnea    Stress-induced cardiomyopathy -- RESOLVED    Post-op Narcotic induced Acute Hypoxic respiratory failure with respiratory and PEA cardiac arrest following accidental narcotic overdose; initial [place] 20-20%, now improved to 55%   Was supported with Impella Cardiac Cath - non-obstructive CAD ; 1 month after event - EF up from 25% to 55-60% - stable x 5 yrs   Thrombocytopenia (HCC) 2014   Past Surgical History:  Procedure Laterality Date   ACHILLES TENDON REPAIR     BIOPSY  08/01/2019   Procedure: BIOPSY;  Surgeon: Napoleon Form, MD;  Location: [facility];  Service: Endoscopy;;   BIOPSY  08/05/2020   Procedure: BIOPSY;  Surgeon: Napoleon Form, MD;  Location: [facility];  Service: Endoscopy;;   CARDIAC CATHETERIZATION  12/29/2012   RN LHC --> Impella. for severe shock with low EF.  Mild to moderate RV pressure elevation. Normal coronary arteries. Cardiac Output 2.5, Index 1.46.   COLONOSCOPY WITH PROPOFOL N/A 10/30/2015   Procedure: COLONOSCOPY WITH PROPOFOL;  Surgeon: Napoleon Form, MD;  Location: [facility];  Service: Endoscopy;  Laterality: N/A;   COLONOSCOPY WITH PROPOFOL N/A 08/01/2019   Procedure: COLONOSCOPY WITH PROPOFOL;  Surgeon: Napoleon Form, MD;  Location: [facility];  Service: Endoscopy;  Laterality: N/A;   COLONOSCOPY WITH PROPOFOL N/A 08/05/2020   Procedure: COLONOSCOPY WITH PROPOFOL;  Surgeon: Napoleon Form, MD;  Location: [facility];  Service: Endoscopy;  Laterality: N/A;   INSERTION OF DIALYSIS CATHETER Right 01/09/2013   Procedure: INSERTION OF DIALYSIS CATHETER;  Surgeon: Chuck Hint, MD;  Location: [facility] OR;  Service: Vascular;  Laterality: Right;   LAMINECTOMY WITH POSTERIOR LATERAL ARTHRODESIS LEVEL 3 N/A 10/15/2022   Procedure: THORACIC NINE-THORACIC TWELVE POSTERIOR INSTRUMENTED FUSION REDUCTION OF FRACTURE;  Surgeon: Bedelia Person, MD;  Location: [facility] OR;  Service: Neurosurgery;  Laterality: N/A;   LEFT AND RIGHT HEART CATHETERIZATION WITH CORONARY ANGIOGRAM  12/29/2012   Procedure: LEFT AND RIGHT HEART CATHETERIZATION WITH CORONARY ANGIOGRAM;  Surgeon: Tonny Bollman, MD;  Location: [facility] CATH LAB;  Service: Cardiovascular;;   POLYPECTOMY  08/01/2019   Procedure: POLYPECTOMY;  Surgeon: Lavon Paganini,  Eleonore Chiquito, MD;  Location: [facility];  Service: Endoscopy;;   POLYPECTOMY  08/05/2020   Procedure: POLYPECTOMY;  Surgeon: Napoleon Form, MD;  Location: [facility];  Service: Endoscopy;;   TRANSTHORACIC ECHOCARDIOGRAM  12/29/2012   EF 25% with periapical HK/AKA.  --> Followup echo 7/25 -- EF of 35%   TRANSTHORACIC ECHOCARDIOGRAM  01/2013 ; 07/2015   a) EF 55-60%. No regional WMA. Grade 1 diastolic dysfunction. Mild LA dilatation.; b) Normal LV size with mild LV hypertrophy. EF 55-60%. Normal RV   TRANSTHORACIC  ECHOCARDIOGRAM  03/2018   EF 55-60%.  NO RWMA. Normal valves   Patient Active Problem List   Diagnosis Date Noted   Injury of thoracic spine (HCC) 10/22/2022   Dorsal (thoracic) vertebral fracture (HCC) 10/16/2022   Injury 10/15/2022   Urinary urgency 07/10/2022   Urinary frequency 07/10/2022   Morbid obesity with BMI of 40.0-44.9, adult (HCC) 09/10/2021   Fatigue 09/10/2021   History of colonic polyps    Polyp of ascending colon    Polyp of transverse colon    Polyp of cecum    Polyp of descending colon    Polyp of sigmoid colon    Hypersomnia 11/29/2017   Other headache syndrome 05/05/2017   Vertigo 05/05/2017   IFG (impaired fasting glucose) 08/21/2016   Obesity (BMI 30-39.9) 06/23/2013   Stress-induced cardiomyopathy -- essentially resolved    CVA (cerebral infarction)    OSA (obstructive sleep apnea)    Hyperlipidemia    Physical deconditioning 01/24/2013   Tinea pedis 01/18/2013   Essential hypertension 01/13/2013   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   ARF (acute renal failure) with tubular necrosis requiring HD 01/11/2013   Anemia 01/11/2013   Thrombocytopenia, unspecified (HCC) 01/11/2013   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 12/29/2012    PCP: Shade Flood, MD  REFERRING PROVIDER: Genice Rouge, MD  REFERRING DIAG: Unspecified fracture of unspecified thoracic vertebra, initial encounter for closed fracture [S22.009A], Unspecified injury at unspecified level of thoracic spinal cord, initial encounter [S24.109A]   Rationale for Evaluation and Treatment: Rehabilitation  THERAPY DIAG:  Pain in thoracic spine  Other low back pain  Muscle weakness (generalized)  Difficulty in walking, not elsewhere classified  Other closed fracture of ninth thoracic vertebra, initial encounter (HCC)  ONSET DATE: 10/15/22  SUBJECTIVE:                                                                                                                                                                                            SUBJECTIVE STATEMENT:  Patient reports continued 6/10 LBP.  Interpretation provided by pt's daughter today per their preference; interpretation services denied.    PERTINENT HISTORY:   OP Date: 10/15/2022  2 weeks 10/29/2022  4 weeks 11/12/2022  6 weeks 11/26/2022  8 weeks 12/10/2022  10 weeks 12/24/2022  12 weeks 01/07/2023    open reduction of T10-11 fracture and posterior lateral arthrodesis of T9-03-19-11  Per note on 7/9: Patient and his daughter report that he went to see physician who updated his activity restrictions: "He can do more core strengthening, but no repetitive forward bending, sidebending, or twisting. He can lift more, but no more than 12#."   PMHx includes ARF, CKD, CVA, HTN, OSA, Obesity, Vertigo, Anemia, hx of cardiac and respiratory arrest related to unintentional narcotic overdose   PAIN:  Are you having pain? Yes: NPRS scale: 6/10 Pain location: along surgical site Pain description: sharp Aggravating factors: none identified at eval  Relieving factors: pain medication  PRECAUTIONS: Other: No lifting more than 1 gallon, no twisting, no bending forward.   WEIGHT BEARING RESTRICTIONS:  lifting restrictions as noted in precautions  FALLS:  Has patient fallen in last 6 months? No  LIVING ENVIRONMENT: Lives with: lives with their family Lives in: House/apartment Stairs: Yes: External: 5 steps; can reach both Has following equipment at home: Dan Humphreys - 2 wheeled  OCCUPATION: lifting/assembling furniture Health visitor)   PLOF: Independent and Independent with basic ADLs  PATIENT GOALS: return to PLOF, including work   NEXT MD VISIT: 11/26/22 to cardiology, 12/03/22 to PCP   OBJECTIVE:   DIAGNOSTIC FINDINGS:  10/15/22 DG THORACOLUMBAR SPINE (post op)  FINDINGS: Pedicle screws are noted at T9, T10, T11 and T12. Posterior fixation elements are not  visualized at this time.   IMPRESSION: Pedicle screw placement from T9-T12 for fixation surrounding a distraction injury at T10-T11.  PATIENT SURVEYS:  FOTO 27 current, 49 predicted  SCREENING FOR RED FLAGS: Bowel or bladder incontinence: Yes: present before accident related to CKD  Spinal tumors: No Cauda equina syndrome: No Compression fracture: No Abdominal aneurysm: No  COGNITION: Overall cognitive status: Within functional limits for tasks assessed      POSTURE: rounded shoulders, forward head, and use of TLSO limited further postural assessment  PALPATION: Not assessed at eval   LUMBAR ROM: not tested at eval d/t post op status   AROM eval  Flexion   Extension   Right lateral flexion   Left lateral flexion   Right rotation   Left rotation    (Blank rows = not tested)  Upper extremity MMT grossly: 4+/5 MMT bilaterally   LOWER EXTREMITY MMT:    MMT Right eval Left eval  Hip flexion 4+ 4+  Hip extension    Hip abduction 4+ 4+  Hip adduction 4 4  Hip internal rotation    Hip external rotation    Knee flexion 5 5  Knee extension 5 5  Ankle dorsiflexion 5 5  Ankle plantarflexion    Ankle inversion    Ankle eversion     (Blank rows = not tested)   FUNCTIONAL TESTS:  6 minute walk test: 425 ft  GAIT: Distance walked: 425 Assistive device utilized: Walker - 2 wheeled Level of assistance: Modified independence Comments: forward flexed posture, requires cueing for shoulder depression and normal breathing  TODAY'S TREATMENT:  [facility] Adult PT Treatment:  DATE: 12/31/22 Therapeutic Exercise: nu-step L5 54m while taking subjective and planning session with patient Seated bilateral shoulder flexion with blue fitter band (resistance via clinician), 2 x 10 each side  Standing in //  bars with bil support Heel raise - 30x Toe raise - 30x Hurdle step over (2 hurdles) 2x fwd, 2x lateral, with airex pad in between  hurdles (increased pain) Step taps 4" with weight shift 2x10 BIL Taps to 3000g ball 3x30" SL circles on 3000g ball CW/CCW x30 each BIL   [facility] Adult PT Treatment:                                                DATE: 12/29/22 Therapeutic Exercise: nu-step L5 80m while taking subjective and planning session with patient Seated bilateral shoulder flexion with blue fitter band (resistance via clinician), x 10 each side  Standing in //  bars with bil support Walking marches x 3 laps  Heel raise - 30x Toe raise - 30x Hurdle step over (2 hurdles) 2x fwd, 2x lateral, with airex pad in between hurdles   [facility] Adult PT Treatment:                                                DATE: 12/24/2022  Therapeutic Exercise: Seated abdominal bracing with ball squeeze - 5 sec hold x 20  Seated pball press, 3 sec hold, 2 x 10  Seated bilateral shoulder flexion with red fitter band (resistance via clinician), x 10 each side  Standing in //  bars with bil support Walking marches x 2 laps  Heel raise - 30x Toe raise - 30x Hurdle step over (2 hurdles) 2x fwd, 2x lateral, with airex pad in between hurdles Cone taps, 2 x 1 minutes with mirror for visual feedback  nu-step L5 25m while taking subjective and planning session with patient     HOME EXERCISE PROGRAM: Access Code: [facility] URL: [email or web address] Date: 12/15/2022 Prepared by: Mauri Reading  Exercises - Seated Diaphragmatic Breathing  - 3 x daily - 7 x weekly - Seated Transversus Abdominis Bracing  - 5 x daily - 7 x weekly - 1 sets - 10 reps - 5 seg hold - Seated Hip Adduction Isometrics with Ball  - 5 x daily - 7 x weekly - 2 sets - 10 reps - 5 seg hold - Standing March with Counter Support  - 5 x daily - 7 x weekly - 2 sets - 5-10 reps - Standing Hip Abduction with Counter Support  - 5 x daily - 7 x weekly - 2 sets - 10 reps   ASSESSMENT:  CLINICAL IMPRESSION: Patient presents to PT reporting continued 6/10 lower back  pain. He was able to tolerate more standing exercises today before his pain increased. Most painful exercise was forward hurdle step overs, particularly when leading with the LLE. He did well with single leg activities today, but does still need occasional rest breaks throughout session. Patient was able to tolerate all prescribed exercises with no adverse effects. Patient continues to benefit from skilled PT services and should be progressed as able to improve functional independence.    OBJECTIVE IMPAIRMENTS: cardiopulmonary status limiting activity, decreased activity tolerance, decreased balance, decreased endurance, decreased  mobility, difficulty walking, decreased strength, and pain.   ACTIVITY LIMITATIONS: carrying, lifting, bending, standing, squatting, sleeping, stairs, transfers, bed mobility, bathing, toileting, dressing, hygiene/grooming, and locomotion level  PARTICIPATION LIMITATIONS: meal prep, cleaning, interpersonal relationship, driving, shopping, community activity, occupation, and yard work  PERSONAL FACTORS: Past/current experiences, Profession, and 3+ comorbidities: PMHx includes CKD, CVA, HTN, OSA, Obesity   are also affecting patient's functional outcome.   REHAB POTENTIAL: Fair    CLINICAL DECISION MAKING: Evolving/moderate complexity  EVALUATION COMPLEXITY: Moderate   GOALS: Goals reviewed with patient? Yes  SHORT TERM GOALS: Target date: 12/19/2022  Patient will be independent with initial home program for core mm activation, and endurance program.  Baseline: provided at eval; to be progressed over next few tx sessions Goal status: MET   LONG TERM GOALS: Target date: 02/13/2023   Patient will report improved overall functional ability with FOTO score of 49 or higher.   Baseline: 27 Goal status: INITIAL  2.  Patient will demonstrate ability to ascend/descend stairs and inclined surfaces without pain or difficulty, using reciprocal pattern.  Goal status:  INITIAL  3.  Patient will report that he is able to perform all ADLs and IADLs without assistance.  Goal status: INITIAL  4.  Patient will report ability to safely lifting at least 30# from floor to waist, and waist to counter height in order to ensure safe return to work activities.  Goal status: INITIAL   PLAN:  PT FREQUENCY: 2x/week, decrease freq to 1x/week after first 8 weeks.   PT DURATION: 12 weeks  PLANNED INTERVENTIONS: Therapeutic exercises, Therapeutic activity, Neuromuscular re-education, Balance training, Gait training, Patient/Family education, Self Care, Stair training, Aquatic Therapy, Cryotherapy, Moist heat, Manual therapy, and Re-evaluation.  PLAN FOR NEXT SESSION: focus on neutral spine core strengthening activities, LE endurance and pt education regarding activity modifications as appropriate   Berta Minor, PTA 12/31/2022, 11:44 AM

## 2023-01-05 ENCOUNTER — Ambulatory Visit: Payer: BC Managed Care – PPO

## 2023-01-05 DIAGNOSIS — M5459 Other low back pain: Secondary | ICD-10-CM

## 2023-01-05 DIAGNOSIS — M6281 Muscle weakness (generalized): Secondary | ICD-10-CM

## 2023-01-05 DIAGNOSIS — M546 Pain in thoracic spine: Secondary | ICD-10-CM

## 2023-01-05 DIAGNOSIS — R262 Difficulty in walking, not elsewhere classified: Secondary | ICD-10-CM

## 2023-01-05 NOTE — Therapy (Signed)
Treatment Note   Patient Name: Nicholas Potts MRN: [id number] DOB:[birth year], 62 y.o., male Today's Date: 01/05/2023   END OF SESSION:  PT End of Session - 01/05/23 1011     Visit Number 13    Number of Visits 21    Date for PT Re-Evaluation 02/13/23    Authorization Type [employer], 30 visit limit PT/OT/ST    Authorization - Visit Number 4    PT Start Time 1011   Pt arrived 11 mins late to appt   PT Stop Time 1045    PT Time Calculation (min) 34 min    Equipment Utilized During Treatment Back brace    Activity Tolerance Patient tolerated treatment well;Patient limited by pain    Behavior During Therapy WFL for tasks assessed/performed              Past Medical History:  Diagnosis Date   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HCC) 2014   post op    ARF (acute respiratory failure) (HCC) 2014   Blood dyscrasia    Chronic kidney disease 2014   ARF    Complication of anesthesia 2014   - post op - in combination of pain pills-  Cardiac and Respiratory Arrest- prior to sleep apnea diagonosis   Constipation    CVA (cerebral infarction)    Hyperlipidemia    Hypertension    OSA (obstructive sleep apnea)    Pre-diabetes    Short-term memory loss    Sleep apnea    Stress-induced cardiomyopathy -- RESOLVED    Post-op Narcotic induced Acute Hypoxic respiratory failure with respiratory and PEA cardiac arrest following accidental narcotic overdose; initial [place] 20-20%, now improved to 55%   Was supported with Impella Cardiac Cath - non-obstructive CAD ; 1 month after event - EF up from 25% to 55-60% - stable x 5 yrs   Thrombocytopenia (HCC) 2014   Past Surgical History:  Procedure Laterality Date   ACHILLES TENDON REPAIR     BIOPSY  08/01/2019   Procedure: BIOPSY;  Surgeon: Napoleon Form, MD;  Location: [facility];  Service: Endoscopy;;   BIOPSY  08/05/2020   Procedure: BIOPSY;  Surgeon: Napoleon Form, MD;  Location: [facility];  Service: Endoscopy;;   CARDIAC  CATHETERIZATION  12/29/2012   RN LHC --> Impella. for severe shock with low EF. Mild to moderate RV pressure elevation. Normal coronary arteries. Cardiac Output 2.5, Index 1.46.   COLONOSCOPY WITH PROPOFOL N/A 10/30/2015   Procedure: COLONOSCOPY WITH PROPOFOL;  Surgeon: Napoleon Form, MD;  Location: [facility];  Service: Endoscopy;  Laterality: N/A;   COLONOSCOPY WITH PROPOFOL N/A 08/01/2019   Procedure: COLONOSCOPY WITH PROPOFOL;  Surgeon: Napoleon Form, MD;  Location: [facility];  Service: Endoscopy;  Laterality: N/A;   COLONOSCOPY WITH PROPOFOL N/A 08/05/2020   Procedure: COLONOSCOPY WITH PROPOFOL;  Surgeon: Napoleon Form, MD;  Location: [facility];  Service: Endoscopy;  Laterality: N/A;   INSERTION OF DIALYSIS CATHETER Right 01/09/2013   Procedure: INSERTION OF DIALYSIS CATHETER;  Surgeon: Chuck Hint, MD;  Location: [facility] OR;  Service: Vascular;  Laterality: Right;   LAMINECTOMY WITH POSTERIOR LATERAL ARTHRODESIS LEVEL 3 N/A 10/15/2022   Procedure: THORACIC NINE-THORACIC TWELVE POSTERIOR INSTRUMENTED FUSION REDUCTION OF FRACTURE;  Surgeon: Bedelia Person, MD;  Location: [facility] OR;  Service: Neurosurgery;  Laterality: N/A;   LEFT AND RIGHT HEART CATHETERIZATION WITH CORONARY ANGIOGRAM  12/29/2012   Procedure: LEFT AND RIGHT HEART CATHETERIZATION WITH CORONARY ANGIOGRAM;  Surgeon: Tonny Bollman, MD;  Location: [facility]  CATH LAB;  Service: Cardiovascular;;   POLYPECTOMY  08/01/2019   Procedure: POLYPECTOMY;  Surgeon: Napoleon Form, MD;  Location: [facility];  Service: Endoscopy;;   POLYPECTOMY  08/05/2020   Procedure: POLYPECTOMY;  Surgeon: Napoleon Form, MD;  Location: [facility];  Service: Endoscopy;;   TRANSTHORACIC ECHOCARDIOGRAM  12/29/2012   EF 25% with periapical HK/AKA.  --> Followup echo 7/25 -- EF of 35%   TRANSTHORACIC ECHOCARDIOGRAM  01/2013 ; 07/2015   a) EF 55-60%. No regional WMA. Grade 1 diastolic dysfunction. Mild LA dilatation.; b) Normal  LV size with mild LV hypertrophy. EF 55-60%. Normal RV   TRANSTHORACIC ECHOCARDIOGRAM  03/2018   EF 55-60%.  NO RWMA. Normal valves   Patient Active Problem List   Diagnosis Date Noted   Injury of thoracic spine (HCC) 10/22/2022   Dorsal (thoracic) vertebral fracture (HCC) 10/16/2022   Injury 10/15/2022   Urinary urgency 07/10/2022   Urinary frequency 07/10/2022   Morbid obesity with BMI of 40.0-44.9, adult (HCC) 09/10/2021   Fatigue 09/10/2021   History of colonic polyps    Polyp of ascending colon    Polyp of transverse colon    Polyp of cecum    Polyp of descending colon    Polyp of sigmoid colon    Hypersomnia 11/29/2017   Other headache syndrome 05/05/2017   Vertigo 05/05/2017   IFG (impaired fasting glucose) 08/21/2016   Obesity (BMI 30-39.9) 06/23/2013   Stress-induced cardiomyopathy -- essentially resolved    CVA (cerebral infarction)    OSA (obstructive sleep apnea)    Hyperlipidemia    Physical deconditioning 01/24/2013   Tinea pedis 01/18/2013   Essential hypertension 01/13/2013   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   ARF (acute renal failure) with tubular necrosis requiring HD 01/11/2013   Anemia 01/11/2013   Thrombocytopenia, unspecified (HCC) 01/11/2013   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 12/29/2012    PCP: Shade Flood, MD  REFERRING PROVIDER: Genice Rouge, MD  REFERRING DIAG: Unspecified fracture of unspecified thoracic vertebra, initial encounter for closed fracture [S22.009A], Unspecified injury at unspecified level of thoracic spinal cord, initial encounter [S24.109A]   Rationale for Evaluation and Treatment: Rehabilitation  THERAPY DIAG:  Pain in thoracic spine  Other low back pain  Muscle weakness (generalized)  Difficulty in walking, not elsewhere classified  ONSET DATE: 10/15/22  SUBJECTIVE:                                                                                                                                                                                            SUBJECTIVE STATEMENT:  Patient reports continued 6/10 LBP.  Interpretation provided by pt's daughter today per their preference; interpretation services denied.    PERTINENT HISTORY:   OP Date: 10/15/2022  2 weeks 10/29/2022  4 weeks 11/12/2022  6 weeks 11/26/2022  8 weeks 12/10/2022  10 weeks 12/24/2022  12 weeks 01/07/2023    open reduction of T10-11 fracture and posterior lateral arthrodesis of T9-03-19-11  Per note on 7/9: Patient and his daughter report that he went to see physician who updated his activity restrictions: "He can do more core strengthening, but no repetitive forward bending, sidebending, or twisting. He can lift more, but no more than 12#."   PMHx includes ARF, CKD, CVA, HTN, OSA, Obesity, Vertigo, Anemia, hx of cardiac and respiratory arrest related to unintentional narcotic overdose   PAIN:  Are you having pain? Yes: NPRS scale: 6/10 Pain location: along surgical site Pain description: sharp Aggravating factors: none identified at eval  Relieving factors: pain medication  PRECAUTIONS: Other: No lifting more than 1 gallon, no twisting, no bending forward.   WEIGHT BEARING RESTRICTIONS:  lifting restrictions as noted in precautions  FALLS:  Has patient fallen in last 6 months? No  LIVING ENVIRONMENT: Lives with: lives with their family Lives in: House/apartment Stairs: Yes: External: 5 steps; can reach both Has following equipment at home: Dan Humphreys - 2 wheeled  OCCUPATION: lifting/assembling furniture Health visitor)   PLOF: Independent and Independent with basic ADLs  PATIENT GOALS: return to PLOF, including work   NEXT MD VISIT: 11/26/22 to cardiology, 12/03/22 to PCP   OBJECTIVE:   DIAGNOSTIC FINDINGS:  10/15/22 DG THORACOLUMBAR SPINE (post op)  FINDINGS: Pedicle screws are noted at T9, T10, T11 and T12. Posterior fixation elements are not  visualized at this time.   IMPRESSION: Pedicle screw placement from T9-T12 for fixation surrounding a distraction injury at T10-T11.  PATIENT SURVEYS:  FOTO 27 current, 49 predicted  SCREENING FOR RED FLAGS: Bowel or bladder incontinence: Yes: present before accident related to CKD  Spinal tumors: No Cauda equina syndrome: No Compression fracture: No Abdominal aneurysm: No  COGNITION: Overall cognitive status: Within functional limits for tasks assessed      POSTURE: rounded shoulders, forward head, and use of TLSO limited further postural assessment  PALPATION: Not assessed at eval   LUMBAR ROM: not tested at eval d/t post op status   AROM eval  Flexion   Extension   Right lateral flexion   Left lateral flexion   Right rotation   Left rotation    (Blank rows = not tested)  Upper extremity MMT grossly: 4+/5 MMT bilaterally   LOWER EXTREMITY MMT:    MMT Right eval Left eval  Hip flexion 4+ 4+  Hip extension    Hip abduction 4+ 4+  Hip adduction 4 4  Hip internal rotation    Hip external rotation    Knee flexion 5 5  Knee extension 5 5  Ankle dorsiflexion 5 5  Ankle plantarflexion    Ankle inversion    Ankle eversion     (Blank rows = not tested)   FUNCTIONAL TESTS:  6 minute walk test: 425 ft  GAIT: Distance walked: 425 Assistive device utilized: Walker - 2 wheeled Level of assistance: Modified independence Comments: forward flexed posture, requires cueing for shoulder depression and normal breathing  TODAY'S TREATMENT:  [facility] Adult PT Treatment:  DATE: 01/05/23 Therapeutic Exercise: Standing in //  bars with bil support Heel raise - 30x Toe raise - 30x Hurdle step over (2 hurdles) 2x fwd, 2x lateral, with airex pad in between hurdles (increased pain) Step ups 4" x10 BIL fwd/lat Mini squats 2x10 BIL UE support Taps to 3000g ball 3x30" SL circles on 3000g ball CW/CCW x10 each BIL   [facility] Adult PT  Treatment:                                                DATE: 12/31/22 Therapeutic Exercise: nu-step L5 28m while taking subjective and planning session with patient Seated bilateral shoulder flexion with blue fitter band (resistance via clinician), 2 x 10 each side  Standing in //  bars with bil support Heel raise - 30x Toe raise - 30x Hurdle step over (2 hurdles) 2x fwd, 2x lateral, with airex pad in between hurdles (increased pain) Step taps 4" with weight shift 2x10 BIL Taps to 3000g ball 3x30" SL circles on 3000g ball CW/CCW x30 each BIL   [facility] Adult PT Treatment:                                                DATE: 12/29/22 Therapeutic Exercise: nu-step L5 25m while taking subjective and planning session with patient Seated bilateral shoulder flexion with blue fitter band (resistance via clinician), x 10 each side  Standing in //  bars with bil support Walking marches x 3 laps  Heel raise - 30x Toe raise - 30x Hurdle step over (2 hurdles) 2x fwd, 2x lateral, with airex pad in between hurdles    HOME EXERCISE PROGRAM: Access Code: [facility] URL: [email or web address] Date: 12/15/2022 Prepared by: Mauri Reading  Exercises - Seated Diaphragmatic Breathing  - 3 x daily - 7 x weekly - Seated Transversus Abdominis Bracing  - 5 x daily - 7 x weekly - 1 sets - 10 reps - 5 seg hold - Seated Hip Adduction Isometrics with Ball  - 5 x daily - 7 x weekly - 2 sets - 10 reps - 5 seg hold - Standing March with Counter Support  - 5 x daily - 7 x weekly - 2 sets - 5-10 reps - Standing Hip Abduction with Counter Support  - 5 x daily - 7 x weekly - 2 sets - 10 reps   ASSESSMENT:  CLINICAL IMPRESSION: Patient presents to PT reporting continued 6/10 lower back pain. He had the most increase in pain with hurdle step overs, particularly leading with the LLE. Patient was able to tolerate all prescribed exercises with no adverse effects. Patient continues to benefit from skilled PT  services and should be progressed as able to improve functional independence.    OBJECTIVE IMPAIRMENTS: cardiopulmonary status limiting activity, decreased activity tolerance, decreased balance, decreased endurance, decreased mobility, difficulty walking, decreased strength, and pain.   ACTIVITY LIMITATIONS: carrying, lifting, bending, standing, squatting, sleeping, stairs, transfers, bed mobility, bathing, toileting, dressing, hygiene/grooming, and locomotion level  PARTICIPATION LIMITATIONS: meal prep, cleaning, interpersonal relationship, driving, shopping, community activity, occupation, and yard work  PERSONAL FACTORS: Past/current experiences, Profession, and 3+ comorbidities: PMHx includes CKD, CVA, HTN, OSA, Obesity   are also affecting patient's functional outcome.  REHAB POTENTIAL: Fair    CLINICAL DECISION MAKING: Evolving/moderate complexity  EVALUATION COMPLEXITY: Moderate   GOALS: Goals reviewed with patient? Yes  SHORT TERM GOALS: Target date: 12/19/2022  Patient will be independent with initial home program for core mm activation, and endurance program.  Baseline: provided at eval; to be progressed over next few tx sessions Goal status: MET   LONG TERM GOALS: Target date: 02/13/2023   Patient will report improved overall functional ability with FOTO score of 49 or higher.   Baseline: 27 Goal status: INITIAL  2.  Patient will demonstrate ability to ascend/descend stairs and inclined surfaces without pain or difficulty, using reciprocal pattern.  Goal status: INITIAL  3.  Patient will report that he is able to perform all ADLs and IADLs without assistance.  Goal status: INITIAL  4.  Patient will report ability to safely lifting at least 30# from floor to waist, and waist to counter height in order to ensure safe return to work activities.  Goal status: INITIAL   PLAN:  PT FREQUENCY: 2x/week, decrease freq to 1x/week after first 8 weeks.   PT DURATION: 12  weeks  PLANNED INTERVENTIONS: Therapeutic exercises, Therapeutic activity, Neuromuscular re-education, Balance training, Gait training, Patient/Family education, Self Care, Stair training, Aquatic Therapy, Cryotherapy, Moist heat, Manual therapy, and Re-evaluation.  PLAN FOR NEXT SESSION: focus on neutral spine core strengthening activities, LE endurance and pt education regarding activity modifications as appropriate   Berta Minor, PTA 01/05/2023, 10:46 AM

## 2023-01-06 ENCOUNTER — Encounter (INDEPENDENT_AMBULATORY_CARE_PROVIDER_SITE_OTHER): Payer: Self-pay

## 2023-01-07 ENCOUNTER — Ambulatory Visit: Payer: BC Managed Care – PPO | Attending: Physical Medicine and Rehabilitation

## 2023-01-07 DIAGNOSIS — R262 Difficulty in walking, not elsewhere classified: Secondary | ICD-10-CM | POA: Diagnosis present

## 2023-01-07 DIAGNOSIS — M546 Pain in thoracic spine: Secondary | ICD-10-CM | POA: Diagnosis not present

## 2023-01-07 DIAGNOSIS — M5459 Other low back pain: Secondary | ICD-10-CM

## 2023-01-07 DIAGNOSIS — M6281 Muscle weakness (generalized): Secondary | ICD-10-CM | POA: Diagnosis present

## 2023-01-07 DIAGNOSIS — S22078A Other fracture of T9-T10 vertebra, initial encounter for closed fracture: Secondary | ICD-10-CM | POA: Diagnosis present

## 2023-01-07 NOTE — Therapy (Signed)
Treatment Note   Patient Name: Nicholas Potts MRN: [id number] DOB:03-15-1961, 62 y.o., male Today's Date: 01/07/2023   END OF SESSION:  PT End of Session - 01/07/23 1130     Visit Number 14    Number of Visits 21    Date for PT Re-Evaluation 02/13/23    Authorization Type [employer], 30 visit limit PT/OT/ST    PT Start Time 1008    PT Stop Time 1040    PT Time Calculation (min) 32 min    Equipment Utilized During Treatment Back brace    Activity Tolerance Patient tolerated treatment well;Patient limited by pain    Behavior During Therapy WFL for tasks assessed/performed               Past Medical History:  Diagnosis Date   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HCC) 2014   post op    ARF (acute respiratory failure) (HCC) 2014   Blood dyscrasia    Chronic kidney disease 2014   ARF    Complication of anesthesia 2014   - post op - in combination of pain pills-  Cardiac and Respiratory Arrest- prior to sleep apnea diagonosis   Constipation    CVA (cerebral infarction)    Hyperlipidemia    Hypertension    OSA (obstructive sleep apnea)    Pre-diabetes    Short-term memory loss    Sleep apnea    Stress-induced cardiomyopathy -- RESOLVED    Post-op Narcotic induced Acute Hypoxic respiratory failure with respiratory and PEA cardiac arrest following accidental narcotic overdose; initial [place] 20-20%, now improved to 55%   Was supported with Impella Cardiac Cath - non-obstructive CAD ; 1 month after event - EF up from 25% to 55-60% - stable x 5 yrs   Thrombocytopenia (HCC) 2014   Past Surgical History:  Procedure Laterality Date   ACHILLES TENDON REPAIR     BIOPSY  08/01/2019   Procedure: BIOPSY;  Surgeon: Napoleon Form, MD;  Location: [facility];  Service: Endoscopy;;   BIOPSY  08/05/2020   Procedure: BIOPSY;  Surgeon: Napoleon Form, MD;  Location: [facility];  Service: Endoscopy;;   CARDIAC CATHETERIZATION  12/29/2012   RN LHC --> Impella. for severe shock with low  EF. Mild to moderate RV pressure elevation. Normal coronary arteries. Cardiac Output 2.5, Index 1.46.   COLONOSCOPY WITH PROPOFOL N/A 10/30/2015   Procedure: COLONOSCOPY WITH PROPOFOL;  Surgeon: Napoleon Form, MD;  Location: [facility];  Service: Endoscopy;  Laterality: N/A;   COLONOSCOPY WITH PROPOFOL N/A 08/01/2019   Procedure: COLONOSCOPY WITH PROPOFOL;  Surgeon: Napoleon Form, MD;  Location: [facility];  Service: Endoscopy;  Laterality: N/A;   COLONOSCOPY WITH PROPOFOL N/A 08/05/2020   Procedure: COLONOSCOPY WITH PROPOFOL;  Surgeon: Napoleon Form, MD;  Location: [facility];  Service: Endoscopy;  Laterality: N/A;   INSERTION OF DIALYSIS CATHETER Right 01/09/2013   Procedure: INSERTION OF DIALYSIS CATHETER;  Surgeon: Chuck Hint, MD;  Location: [facility] OR;  Service: Vascular;  Laterality: Right;   LAMINECTOMY WITH POSTERIOR LATERAL ARTHRODESIS LEVEL 3 N/A 10/15/2022   Procedure: THORACIC NINE-THORACIC TWELVE POSTERIOR INSTRUMENTED FUSION REDUCTION OF FRACTURE;  Surgeon: Bedelia Person, MD;  Location: [facility] OR;  Service: Neurosurgery;  Laterality: N/A;   LEFT AND RIGHT HEART CATHETERIZATION WITH CORONARY ANGIOGRAM  12/29/2012   Procedure: LEFT AND RIGHT HEART CATHETERIZATION WITH CORONARY ANGIOGRAM;  Surgeon: Tonny Bollman, MD;  Location: [facility] CATH LAB;  Service: Cardiovascular;;   POLYPECTOMY  08/01/2019   Procedure: POLYPECTOMY;  Surgeon: Napoleon Form, MD;  Location: [facility];  Service: Endoscopy;;   POLYPECTOMY  08/05/2020   Procedure: POLYPECTOMY;  Surgeon: Napoleon Form, MD;  Location: [facility];  Service: Endoscopy;;   TRANSTHORACIC ECHOCARDIOGRAM  12/29/2012   EF 25% with periapical HK/AKA.  --> Followup echo 7/25 -- EF of 35%   TRANSTHORACIC ECHOCARDIOGRAM  01/2013 ; 07/2015   a) EF 55-60%. No regional WMA. Grade 1 diastolic dysfunction. Mild LA dilatation.; b) Normal LV size with mild LV hypertrophy. EF 55-60%. Normal RV   TRANSTHORACIC  ECHOCARDIOGRAM  03/2018   EF 55-60%.  NO RWMA. Normal valves   Patient Active Problem List   Diagnosis Date Noted   Injury of thoracic spine (HCC) 10/22/2022   Dorsal (thoracic) vertebral fracture (HCC) 10/16/2022   Injury 10/15/2022   Urinary urgency 07/10/2022   Urinary frequency 07/10/2022   Morbid obesity with BMI of 40.0-44.9, adult (HCC) 09/10/2021   Fatigue 09/10/2021   History of colonic polyps    Polyp of ascending colon    Polyp of transverse colon    Polyp of cecum    Polyp of descending colon    Polyp of sigmoid colon    Hypersomnia 11/29/2017   Other headache syndrome 05/05/2017   Vertigo 05/05/2017   IFG (impaired fasting glucose) 08/21/2016   Obesity (BMI 30-39.9) 06/23/2013   Stress-induced cardiomyopathy -- essentially resolved    CVA (cerebral infarction)    OSA (obstructive sleep apnea)    Hyperlipidemia    Physical deconditioning 01/24/2013   Tinea pedis 01/18/2013   Essential hypertension 01/13/2013   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   ARF (acute renal failure) with tubular necrosis requiring HD 01/11/2013   Anemia 01/11/2013   Thrombocytopenia, unspecified (HCC) 01/11/2013   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 12/29/2012    PCP: Shade Flood, MD  REFERRING PROVIDER: Genice Rouge, MD  REFERRING DIAG: Unspecified fracture of unspecified thoracic vertebra, initial encounter for closed fracture [S22.009A], Unspecified injury at unspecified level of thoracic spinal cord, initial encounter [S24.109A]   Rationale for Evaluation and Treatment: Rehabilitation  THERAPY DIAG:  Pain in thoracic spine  Other low back pain  Muscle weakness (generalized)  Difficulty in walking, not elsewhere classified  ONSET DATE: 10/15/22  SUBJECTIVE:                                                                                                                                                                                            SUBJECTIVE STATEMENT:  Patient reports 6/10 pain at start of session.   Interpretation provided by pt's daughter  today per their preference; interpretation services denied.    PERTINENT HISTORY:   OP Date: 10/15/2022  2 weeks 10/29/2022  4 weeks 11/12/2022  6 weeks 11/26/2022  8 weeks 12/10/2022  10 weeks 12/24/2022  12 weeks 01/07/2023    open reduction of T10-11 fracture and posterior lateral arthrodesis of T9-03-19-11  Per note on 7/9: Patient and his daughter report that he went to see physician who updated his activity restrictions: "He can do more core strengthening, but no repetitive forward bending, sidebending, or twisting. He can lift more, but no more than 12#."   PMHx includes ARF, CKD, CVA, HTN, OSA, Obesity, Vertigo, Anemia, hx of cardiac and respiratory arrest related to unintentional narcotic overdose   PAIN:  Are you having pain? Yes: NPRS scale: 6/10 Pain location: along surgical site Pain description: sharp Aggravating factors: none identified at eval  Relieving factors: pain medication  PRECAUTIONS: Other: No lifting more than 1 gallon, no twisting, no bending forward.   WEIGHT BEARING RESTRICTIONS:  lifting restrictions as noted in precautions  FALLS:  Has patient fallen in last 6 months? No  LIVING ENVIRONMENT: Lives with: lives with their family Lives in: House/apartment Stairs: Yes: External: 5 steps; can reach both Has following equipment at home: Dan Humphreys - 2 wheeled  OCCUPATION: lifting/assembling furniture Health visitor)   PLOF: Independent and Independent with basic ADLs  PATIENT GOALS: return to PLOF, including work   NEXT MD VISIT: 11/26/22 to cardiology, 12/03/22 to PCP   OBJECTIVE:   DIAGNOSTIC FINDINGS:  10/15/22 DG THORACOLUMBAR SPINE (post op)  FINDINGS: Pedicle screws are noted at T9, T10, T11 and T12. Posterior fixation elements are not visualized at this time.   IMPRESSION: Pedicle screw placement  from T9-T12 for fixation surrounding a distraction injury at T10-T11.  PATIENT SURVEYS:  FOTO 27 current, 49 predicted  SCREENING FOR RED FLAGS: Bowel or bladder incontinence: Yes: present before accident related to CKD  Spinal tumors: No Cauda equina syndrome: No Compression fracture: No Abdominal aneurysm: No  COGNITION: Overall cognitive status: Within functional limits for tasks assessed      POSTURE: rounded shoulders, forward head, and use of TLSO limited further postural assessment  PALPATION: Not assessed at eval   LUMBAR ROM: not tested at eval d/t post op status   AROM eval  Flexion   Extension   Right lateral flexion   Left lateral flexion   Right rotation   Left rotation    (Blank rows = not tested)  Upper extremity MMT grossly: 4+/5 MMT bilaterally   LOWER EXTREMITY MMT:    MMT Right eval Left eval  Hip flexion 4+ 4+  Hip extension    Hip abduction 4+ 4+  Hip adduction 4 4  Hip internal rotation    Hip external rotation    Knee flexion 5 5  Knee extension 5 5  Ankle dorsiflexion 5 5  Ankle plantarflexion    Ankle inversion    Ankle eversion     (Blank rows = not tested)   FUNCTIONAL TESTS:  6 minute walk test: 425 ft  GAIT: Distance walked: 425 Assistive device utilized: Walker - 2 wheeled Level of assistance: Modified independence Comments: forward flexed posture, requires cueing for shoulder depression and normal breathing   TODAY'S TREATMENT:   [facility] Adult PT Treatment:  DATE: 01/07/2023  Therapeutic Exercise: Standing in //  bars with bil support Heel raise - 30x Toe raise - 30x Obstacle Course: Hurdle step over (2 hurdles), with airex pad in between hurdles, 4" step, x 2 fwd/2 lat Mini squats 2x10 BIL UE support Taps to 5000g ball 2 x 60" SL circles on 5000g ball CW/CCW, 2 x10 each BIL Nustep level 3 x 5 minutes at end of session for cool down and pain modulation effects   [facility]  Adult PT Treatment:                                                DATE: 01/05/23  Therapeutic Exercise: Standing in //  bars with bil support Heel raise - 30x Toe raise - 30x Hurdle step over (2 hurdles) 2x fwd, 2x lateral, with airex pad in between hurdles (increased pain) Step ups 4" x10 BIL fwd/lat Mini squats 2x10 BIL UE support Taps to 3000g ball 3x30" SL circles on 3000g ball CW/CCW x10 each BIL   [facility] Adult PT Treatment:                                                DATE: 12/31/22 Therapeutic Exercise: nu-step L5 33m while taking subjective and planning session with patient Seated bilateral shoulder flexion with blue fitter band (resistance via clinician), 2 x 10 each side  Standing in //  bars with bil support Heel raise - 30x Toe raise - 30x Hurdle step over (2 hurdles) 2x fwd, 2x lateral, with airex pad in between hurdles (increased pain) Step taps 4" with weight shift 2x10 BIL Taps to 3000g ball 3x30" SL circles on 3000g ball CW/CCW x30 each BIL    HOME EXERCISE PROGRAM: Access Code: [facility] URL: [email or web address] Date: 12/15/2022 Prepared by: Mauri Reading  Exercises - Seated Diaphragmatic Breathing  - 3 x daily - 7 x weekly - Seated Transversus Abdominis Bracing  - 5 x daily - 7 x weekly - 1 sets - 10 reps - 5 seg hold - Seated Hip Adduction Isometrics with Ball  - 5 x daily - 7 x weekly - 2 sets - 10 reps - 5 seg hold - Standing March with Counter Support  - 5 x daily - 7 x weekly - 2 sets - 5-10 reps - Standing Hip Abduction with Counter Support  - 5 x daily - 7 x weekly - 2 sets - 10 reps   ASSESSMENT:  CLINICAL IMPRESSION: Olive continues to progress throughout exercises well with seated rest breaks as needed. We will continue to progress core strengthening activities as appropriate.    OBJECTIVE IMPAIRMENTS: cardiopulmonary status limiting activity, decreased activity tolerance, decreased balance, decreased endurance, decreased  mobility, difficulty walking, decreased strength, and pain.   ACTIVITY LIMITATIONS: carrying, lifting, bending, standing, squatting, sleeping, stairs, transfers, bed mobility, bathing, toileting, dressing, hygiene/grooming, and locomotion level  PARTICIPATION LIMITATIONS: meal prep, cleaning, interpersonal relationship, driving, shopping, community activity, occupation, and yard work  PERSONAL FACTORS: Past/current experiences, Profession, and 3+ comorbidities: PMHx includes CKD, CVA, HTN, OSA, Obesity   are also affecting patient's functional outcome.   REHAB POTENTIAL: Fair    CLINICAL DECISION MAKING: Evolving/moderate complexity  EVALUATION COMPLEXITY: Moderate   GOALS: Goals reviewed  with patient? Yes  SHORT TERM GOALS: Target date: 12/19/2022  Patient will be independent with initial home program for core mm activation, and endurance program.  Baseline: provided at eval; to be progressed over next few tx sessions Goal status: MET   LONG TERM GOALS: Target date: 02/13/2023   Patient will report improved overall functional ability with FOTO score of 49 or higher.   Baseline: 27 Goal status: INITIAL  2.  Patient will demonstrate ability to ascend/descend stairs and inclined surfaces without pain or difficulty, using reciprocal pattern.  Goal status: INITIAL  3.  Patient will report that he is able to perform all ADLs and IADLs without assistance.  Goal status: INITIAL  4.  Patient will report ability to safely lifting at least 30# from floor to waist, and waist to counter height in order to ensure safe return to work activities.  Goal status: INITIAL   PLAN:  PT FREQUENCY: 2x/week, decrease freq to 1x/week after first 8 weeks.   PT DURATION: 12 weeks  PLANNED INTERVENTIONS: Therapeutic exercises, Therapeutic activity, Neuromuscular re-education, Balance training, Gait training, Patient/Family education, Self Care, Stair training, Aquatic Therapy, Cryotherapy, Moist  heat, Manual therapy, and Re-evaluation.  PLAN FOR NEXT SESSION: continue progress of neutral spine exercises (consider supine program)  LE endurance and pt education regarding activity modifications as appropriate   Mauri Reading, PT, DPT 01/07/2023, 12:07 PM

## 2023-01-12 ENCOUNTER — Ambulatory Visit: Payer: BC Managed Care – PPO

## 2023-01-12 DIAGNOSIS — M6281 Muscle weakness (generalized): Secondary | ICD-10-CM

## 2023-01-12 DIAGNOSIS — M546 Pain in thoracic spine: Secondary | ICD-10-CM

## 2023-01-12 DIAGNOSIS — M5459 Other low back pain: Secondary | ICD-10-CM

## 2023-01-12 DIAGNOSIS — R262 Difficulty in walking, not elsewhere classified: Secondary | ICD-10-CM

## 2023-01-12 NOTE — Therapy (Signed)
Treatment Note   Patient Name: Nicholas Potts MRN: [id number] DOB:04-03-1961, 62 y.o., male Today's Date: 01/12/2023   END OF SESSION:  PT End of Session - 01/12/23 1157     Visit Number 15    Number of Visits 21    Date for PT Re-Evaluation 02/13/23    Authorization Type [employer], 30 visit limit PT/OT/ST    PT Start Time 1153    PT Stop Time 1213    PT Time Calculation (min) 20 min    Equipment Utilized During Treatment Back brace    Activity Tolerance Patient tolerated treatment well;Patient limited by pain    Behavior During Therapy WFL for tasks assessed/performed                Past Medical History:  Diagnosis Date   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HCC) 2014   post op    ARF (acute respiratory failure) (HCC) 2014   Blood dyscrasia    Chronic kidney disease 2014   ARF    Complication of anesthesia 2014   - post op - in combination of pain pills-  Cardiac and Respiratory Arrest- prior to sleep apnea diagonosis   Constipation    CVA (cerebral infarction)    Hyperlipidemia    Hypertension    OSA (obstructive sleep apnea)    Pre-diabetes    Short-term memory loss    Sleep apnea    Stress-induced cardiomyopathy -- RESOLVED    Post-op Narcotic induced Acute Hypoxic respiratory failure with respiratory and PEA cardiac arrest following accidental narcotic overdose; initial [place] 20-20%, now improved to 55%   Was supported with Impella Cardiac Cath - non-obstructive CAD ; 1 month after event - EF up from 25% to 55-60% - stable x 5 yrs   Thrombocytopenia (HCC) 2014   Past Surgical History:  Procedure Laterality Date   ACHILLES TENDON REPAIR     BIOPSY  08/01/2019   Procedure: BIOPSY;  Surgeon: Napoleon Form, MD;  Location: [facility];  Service: Endoscopy;;   BIOPSY  08/05/2020   Procedure: BIOPSY;  Surgeon: Napoleon Form, MD;  Location: [facility];  Service: Endoscopy;;   CARDIAC CATHETERIZATION  12/29/2012   RN LHC --> Impella. for severe shock with  low EF. Mild to moderate RV pressure elevation. Normal coronary arteries. Cardiac Output 2.5, Index 1.46.   COLONOSCOPY WITH PROPOFOL N/A 10/30/2015   Procedure: COLONOSCOPY WITH PROPOFOL;  Surgeon: Napoleon Form, MD;  Location: [facility];  Service: Endoscopy;  Laterality: N/A;   COLONOSCOPY WITH PROPOFOL N/A 08/01/2019   Procedure: COLONOSCOPY WITH PROPOFOL;  Surgeon: Napoleon Form, MD;  Location: [facility];  Service: Endoscopy;  Laterality: N/A;   COLONOSCOPY WITH PROPOFOL N/A 08/05/2020   Procedure: COLONOSCOPY WITH PROPOFOL;  Surgeon: Napoleon Form, MD;  Location: [facility];  Service: Endoscopy;  Laterality: N/A;   INSERTION OF DIALYSIS CATHETER Right 01/09/2013   Procedure: INSERTION OF DIALYSIS CATHETER;  Surgeon: Chuck Hint, MD;  Location: [facility] OR;  Service: Vascular;  Laterality: Right;   LAMINECTOMY WITH POSTERIOR LATERAL ARTHRODESIS LEVEL 3 N/A 10/15/2022   Procedure: THORACIC NINE-THORACIC TWELVE POSTERIOR INSTRUMENTED FUSION REDUCTION OF FRACTURE;  Surgeon: Bedelia Person, MD;  Location: [facility] OR;  Service: Neurosurgery;  Laterality: N/A;   LEFT AND RIGHT HEART CATHETERIZATION WITH CORONARY ANGIOGRAM  12/29/2012   Procedure: LEFT AND RIGHT HEART CATHETERIZATION WITH CORONARY ANGIOGRAM;  Surgeon: Tonny Bollman, MD;  Location: [facility] CATH LAB;  Service: Cardiovascular;;   POLYPECTOMY  08/01/2019   Procedure: POLYPECTOMY;  Surgeon: Napoleon Form, MD;  Location: [facility];  Service: Endoscopy;;   POLYPECTOMY  08/05/2020   Procedure: POLYPECTOMY;  Surgeon: Napoleon Form, MD;  Location: [facility];  Service: Endoscopy;;   TRANSTHORACIC ECHOCARDIOGRAM  12/29/2012   EF 25% with periapical HK/AKA.  --> Followup echo 7/25 -- EF of 35%   TRANSTHORACIC ECHOCARDIOGRAM  01/2013 ; 07/2015   a) EF 55-60%. No regional WMA. Grade 1 diastolic dysfunction. Mild LA dilatation.; b) Normal LV size with mild LV hypertrophy. EF 55-60%. Normal RV   TRANSTHORACIC  ECHOCARDIOGRAM  03/2018   EF 55-60%.  NO RWMA. Normal valves   Patient Active Problem List   Diagnosis Date Noted   Injury of thoracic spine (HCC) 10/22/2022   Dorsal (thoracic) vertebral fracture (HCC) 10/16/2022   Injury 10/15/2022   Urinary urgency 07/10/2022   Urinary frequency 07/10/2022   Morbid obesity with BMI of 40.0-44.9, adult (HCC) 09/10/2021   Fatigue 09/10/2021   History of colonic polyps    Polyp of ascending colon    Polyp of transverse colon    Polyp of cecum    Polyp of descending colon    Polyp of sigmoid colon    Hypersomnia 11/29/2017   Other headache syndrome 05/05/2017   Vertigo 05/05/2017   IFG (impaired fasting glucose) 08/21/2016   Obesity (BMI 30-39.9) 06/23/2013   Stress-induced cardiomyopathy -- essentially resolved    CVA (cerebral infarction)    OSA (obstructive sleep apnea)    Hyperlipidemia    Physical deconditioning 01/24/2013   Tinea pedis 01/18/2013   Essential hypertension 01/13/2013   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   ARF (acute renal failure) with tubular necrosis requiring HD 01/11/2013   Anemia 01/11/2013   Thrombocytopenia, unspecified (HCC) 01/11/2013   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 12/29/2012    PCP: Shade Flood, MD  REFERRING PROVIDER: Genice Rouge, MD  REFERRING DIAG: Unspecified fracture of unspecified thoracic vertebra, initial encounter for closed fracture [S22.009A], Unspecified injury at unspecified level of thoracic spinal cord, initial encounter [S24.109A]   Rationale for Evaluation and Treatment: Rehabilitation  THERAPY DIAG:  No diagnosis found.  ONSET DATE: 10/15/22  SUBJECTIVE:                                                                                                                                                                                           SUBJECTIVE STATEMENT:  Patient reports 6/10 pain at start of session. He  feels that he is making improvement.   He arrives >20 minutes late today's appt. Daughter states "I got the appointment time mixed  up and picked him up late.   Interpretation provided by pt's daughter today per their preference; interpretation services denied.    PERTINENT HISTORY:   OP Date: 10/15/2022  2 weeks 10/29/2022  4 weeks 11/12/2022  6 weeks 11/26/2022  8 weeks 12/10/2022  10 weeks 12/24/2022  12 weeks 01/07/2023    open reduction of T10-11 fracture and posterior lateral arthrodesis of T9-03-19-11  Per note on 7/9: Patient and his daughter report that he went to see physician who updated his activity restrictions: "He can do more core strengthening, but no repetitive forward bending, sidebending, or twisting. He can lift more, but no more than 12#."   PMHx includes ARF, CKD, CVA, HTN, OSA, Obesity, Vertigo, Anemia, hx of cardiac and respiratory arrest related to unintentional narcotic overdose   PAIN:  Are you having pain? Yes: NPRS scale: 6/10 Pain location: along surgical site Pain description: sharp Aggravating factors: none identified at eval  Relieving factors: pain medication  PRECAUTIONS: Other: No lifting more than 1 gallon, no twisting, no bending forward.   WEIGHT BEARING RESTRICTIONS:  lifting restrictions as noted in precautions  FALLS:  Has patient fallen in last 6 months? No  LIVING ENVIRONMENT: Lives with: lives with their family Lives in: House/apartment Stairs: Yes: External: 5 steps; can reach both Has following equipment at home: Dan Humphreys - 2 wheeled  OCCUPATION: lifting/assembling furniture Health visitor)   PLOF: Independent and Independent with basic ADLs  PATIENT GOALS: return to PLOF, including work   NEXT MD VISIT: 11/26/22 to cardiology, 12/03/22 to PCP   OBJECTIVE:   DIAGNOSTIC FINDINGS:  10/15/22 DG THORACOLUMBAR SPINE (post op)  FINDINGS: Pedicle screws are noted at T9, T10, T11 and T12. Posterior fixation elements are not visualized at  this time.   IMPRESSION: Pedicle screw placement from T9-T12 for fixation surrounding a distraction injury at T10-T11.  PATIENT SURVEYS:  FOTO 27 current, 49 predicted  SCREENING FOR RED FLAGS: Bowel or bladder incontinence: Yes: present before accident related to CKD  Spinal tumors: No Cauda equina syndrome: No Compression fracture: No Abdominal aneurysm: No  COGNITION: Overall cognitive status: Within functional limits for tasks assessed      POSTURE: rounded shoulders, forward head, and use of TLSO limited further postural assessment  PALPATION: Not assessed at eval   LUMBAR ROM: not tested at eval d/t post op status   AROM eval  Flexion   Extension   Right lateral flexion   Left lateral flexion   Right rotation   Left rotation    (Blank rows = not tested)  Upper extremity MMT grossly: 4+/5 MMT bilaterally   LOWER EXTREMITY MMT:    MMT Right eval Left eval  Hip flexion 4+ 4+  Hip extension    Hip abduction 4+ 4+  Hip adduction 4 4  Hip internal rotation    Hip external rotation    Knee flexion 5 5  Knee extension 5 5  Ankle dorsiflexion 5 5  Ankle plantarflexion    Ankle inversion    Ankle eversion     (Blank rows = not tested)   FUNCTIONAL TESTS:  6 minute walk test: 425 ft  GAIT: Distance walked: 425 Assistive device utilized: Walker - 2 wheeled Level of assistance: Modified independence Comments: forward flexed posture, requires cueing for shoulder depression and normal breathing   TODAY'S TREATMENT:    [facility] Adult PT Treatment:  DATE: 01/12/2023  Therapeutic Exercise: Standing in //  bars with bil support Heel raise - 30x Toe raise - 30x 6" step ups, 2 x 10  6" lateral step ups, x 10 BIL  Taps to 5000g ball 2 x 60" SL circles on 5000g ball CW/CCW, x15 each BIL   [facility] Adult PT Treatment:                                                DATE: 01/07/2023  Therapeutic Exercise: Standing in  //  bars with bil support Heel raise - 30x Toe raise - 30x Obstacle Course: Hurdle step over (2 hurdles), with airex pad in between hurdles, 4" step, x 2 fwd/2 lat Mini squats 2x10 BIL UE support Taps to 5000g ball 2 x 60" SL circles on 5000g ball CW/CCW, 2 x10 each BIL Nustep level 3 x 5 minutes at end of session for cool down and pain modulation effects   [facility] Adult PT Treatment:                                                DATE: 01/05/23  Therapeutic Exercise: Standing in //  bars with bil support Heel raise - 30x Toe raise - 30x Hurdle step over (2 hurdles) 2x fwd, 2x lateral, with airex pad in between hurdles (increased pain) Step ups 4" x10 BIL fwd/lat Mini squats 2x10 BIL UE support Taps to 3000g ball 3x30" SL circles on 3000g ball CW/CCW x10 each BIL    HOME EXERCISE PROGRAM: Access Code: [facility] URL: [email or web address] Date: 12/15/2022 Prepared by: Mauri Reading  Exercises - Seated Diaphragmatic Breathing  - 3 x daily - 7 x weekly - Seated Transversus Abdominis Bracing  - 5 x daily - 7 x weekly - 1 sets - 10 reps - 5 seg hold - Seated Hip Adduction Isometrics with Ball  - 5 x daily - 7 x weekly - 2 sets - 10 reps - 5 seg hold - Standing March with Counter Support  - 5 x daily - 7 x weekly - 2 sets - 5-10 reps - Standing Hip Abduction with Counter Support  - 5 x daily - 7 x weekly - 2 sets - 10 reps   ASSESSMENT:  CLINICAL IMPRESSION: Today's session was abbreviated d/t patient tardiness as noted in subjective. Catcher is demonstrating improved activity tolerance including progression to 6" step up to improved navigation of standard stairs. He is able to tolerate about 10 minutes of standing activities prior to requiring seated rest breaks. He reports increased pain at end of session from 6/10 to 7/10. We will continue to progress as tolerated.    OBJECTIVE IMPAIRMENTS: cardiopulmonary status limiting activity, decreased activity tolerance,  decreased balance, decreased endurance, decreased mobility, difficulty walking, decreased strength, and pain.   ACTIVITY LIMITATIONS: carrying, lifting, bending, standing, squatting, sleeping, stairs, transfers, bed mobility, bathing, toileting, dressing, hygiene/grooming, and locomotion level  PARTICIPATION LIMITATIONS: meal prep, cleaning, interpersonal relationship, driving, shopping, community activity, occupation, and yard work  PERSONAL FACTORS: Past/current experiences, Profession, and 3+ comorbidities: PMHx includes CKD, CVA, HTN, OSA, Obesity   are also affecting patient's functional outcome.   REHAB POTENTIAL: Fair    CLINICAL DECISION MAKING: Evolving/moderate complexity  EVALUATION  COMPLEXITY: Moderate   GOALS: Goals reviewed with patient? Yes  SHORT TERM GOALS: Target date: 12/19/2022  Patient will be independent with initial home program for core mm activation, and endurance program.  Baseline: provided at eval; to be progressed over next few tx sessions Goal status: MET   LONG TERM GOALS: Target date: 02/13/2023   Patient will report improved overall functional ability with FOTO score of 49 or higher.   Baseline: 27 Goal status: INITIAL  2.  Patient will demonstrate ability to ascend/descend stairs and inclined surfaces without pain or difficulty, using reciprocal pattern.  Goal status: INITIAL  3.  Patient will report that he is able to perform all ADLs and IADLs without assistance.  Goal status: INITIAL  4.  Patient will report ability to safely lifting at least 30# from floor to waist, and waist to counter height in order to ensure safe return to work activities.  Goal status: INITIAL   PLAN:  PT FREQUENCY: 2x/week, decrease freq to 1x/week after first 8 weeks.   PT DURATION: 12 weeks  PLANNED INTERVENTIONS: Therapeutic exercises, Therapeutic activity, Neuromuscular re-education, Balance training, Gait training, Patient/Family education, Self Care, Stair  training, Aquatic Therapy, Cryotherapy, Moist heat, Manual therapy, and Re-evaluation.  PLAN FOR NEXT SESSION: continue progress of neutral spine exercises (consider supine program)  LE endurance and pt education regarding activity modifications as appropriate   Mauri Reading, PT, DPT 01/12/2023, 3:43 PM

## 2023-01-13 ENCOUNTER — Ambulatory Visit: Payer: BC Managed Care – PPO | Admitting: Urology

## 2023-01-13 NOTE — Progress Notes (Deleted)
Assessment: 1. Urinary urgency   2. Urinary frequency     Plan: Continue Myrbetriq 25 mg daily.  Samples and rx given.  Bladder diet sheet given. Return to office in 2 months  Chief Complaint:  No chief complaint on file.   History of Present Illness:  Nicholas Potts is a 62 y.o. male who is seen for further evaluation of lower urinary symptoms.  His symptoms have been present for at least 1 year.  He reported urinary frequency, voiding 8-10 times per day, urgency and occasional urge incontinence.  He noted some slight dysuria at the end of his void.  No nocturia.  He reported that he voids with a good stream and feels like he empties his bladder completely.  No gross hematuria.  No history of UTIs.  He has a remote history of gonorrhea 30 years ago.  He had not tried any medical therapy for his symptoms. IPSS = 5.  PVR = 0 ml.  PSA 0.74 from 1/24.  He was given a trial of Myrbetriq 25 mg daily at his visit in 2/24. He returns today for scheduled follow-up.  He reports improvement in his urinary symptoms with Myrbetriq.  He had decreased frequency and urgency and resolution of his incontinence.  No side effects from the medication.  He reports that he was voiding every 3-4 hours while on the medication.  He recently ran out of samples.  He continues to have some slight dysuria during voiding.  No gross hematuria or flank pain. IPSS = 7 today.  The patient's daughter interpreted during visit.  Portions of the above documentation were copied from a prior visit for review purposes only.   Past Medical History:  Past Medical History:  Diagnosis Date   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HCC) 2014   post op    ARF (acute respiratory failure) (HCC) 2014   Blood dyscrasia    Chronic kidney disease 2014   ARF    Complication of anesthesia 2014   - post op - in combination of pain pills-  Cardiac and Respiratory Arrest- prior to sleep apnea diagonosis   Constipation    CVA (cerebral  infarction)    Hyperlipidemia    Hypertension    OSA (obstructive sleep apnea)    Pre-diabetes    Short-term memory loss    Sleep apnea    Stress-induced cardiomyopathy -- RESOLVED    Post-op Narcotic induced Acute Hypoxic respiratory failure with respiratory and PEA cardiac arrest following accidental narcotic overdose; initial [place] 20-20%, now improved to 55%   Was supported with Impella Cardiac Cath - non-obstructive CAD ; 1 month after event - EF up from 25% to 55-60% - stable x 5 yrs   Thrombocytopenia (HCC) 2014    Past Surgical History:  Past Surgical History:  Procedure Laterality Date   ACHILLES TENDON REPAIR     BIOPSY  08/01/2019   Procedure: BIOPSY;  Surgeon: Napoleon Form, MD;  Location: [facility];  Service: Endoscopy;;   BIOPSY  08/05/2020   Procedure: BIOPSY;  Surgeon: Napoleon Form, MD;  Location: [facility];  Service: Endoscopy;;   CARDIAC CATHETERIZATION  12/29/2012   RN LHC --> Impella. for severe shock with low EF. Mild to moderate RV pressure elevation. Normal coronary arteries. Cardiac Output 2.5, Index 1.46.   COLONOSCOPY WITH PROPOFOL N/A 10/30/2015   Procedure: COLONOSCOPY WITH PROPOFOL;  Surgeon: Napoleon Form, MD;  Location: [facility];  Service: Endoscopy;  Laterality: N/A;   COLONOSCOPY WITH PROPOFOL  N/A 08/01/2019   Procedure: COLONOSCOPY WITH PROPOFOL;  Surgeon: Napoleon Form, MD;  Location: [facility];  Service: Endoscopy;  Laterality: N/A;   COLONOSCOPY WITH PROPOFOL N/A 08/05/2020   Procedure: COLONOSCOPY WITH PROPOFOL;  Surgeon: Napoleon Form, MD;  Location: [facility];  Service: Endoscopy;  Laterality: N/A;   INSERTION OF DIALYSIS CATHETER Right 01/09/2013   Procedure: INSERTION OF DIALYSIS CATHETER;  Surgeon: Chuck Hint, MD;  Location: [facility] OR;  Service: Vascular;  Laterality: Right;   LAMINECTOMY WITH POSTERIOR LATERAL ARTHRODESIS LEVEL 3 N/A 10/15/2022   Procedure: THORACIC NINE-THORACIC TWELVE  POSTERIOR INSTRUMENTED FUSION REDUCTION OF FRACTURE;  Surgeon: Bedelia Person, MD;  Location: [facility] OR;  Service: Neurosurgery;  Laterality: N/A;   LEFT AND RIGHT HEART CATHETERIZATION WITH CORONARY ANGIOGRAM  12/29/2012   Procedure: LEFT AND RIGHT HEART CATHETERIZATION WITH CORONARY ANGIOGRAM;  Surgeon: Tonny Bollman, MD;  Location: [facility] CATH LAB;  Service: Cardiovascular;;   POLYPECTOMY  08/01/2019   Procedure: POLYPECTOMY;  Surgeon: Napoleon Form, MD;  Location: [facility];  Service: Endoscopy;;   POLYPECTOMY  08/05/2020   Procedure: POLYPECTOMY;  Surgeon: Napoleon Form, MD;  Location: [facility];  Service: Endoscopy;;   TRANSTHORACIC ECHOCARDIOGRAM  12/29/2012   EF 25% with periapical HK/AKA.  --> Followup echo 7/25 -- EF of 35%   TRANSTHORACIC ECHOCARDIOGRAM  01/2013 ; 07/2015   a) EF 55-60%. No regional WMA. Grade 1 diastolic dysfunction. Mild LA dilatation.; b) Normal LV size with mild LV hypertrophy. EF 55-60%. Normal RV   TRANSTHORACIC ECHOCARDIOGRAM  03/2018   EF 55-60%.  NO RWMA. Normal valves    Allergies:  Allergies  Allergen Reactions   Codeine    Oxycontin [Oxycodone] Other (See Comments)    Oxygen levels drop    Family History:  Family History  Problem Relation Age of Onset   Cancer Mother        Liver cancer   Cancer Father        Leukemia   Cancer Maternal Grandmother        Liver cancer   Cancer Paternal Grandmother        Colon cancer   Colon cancer Neg Hx     Social History:  Social History   Tobacco Use   Smoking status: Former    Current packs/day: 0.00    Types: Cigarettes    Quit date: 06/20/1986    Years since quitting: 36.5   Smokeless tobacco: Never  Vaping Use   Vaping status: Never Used  Substance Use Topics   Alcohol use: Yes    Comment: History of heavy use but only social at present.  10/29/15 - "a beer every week or so"   Drug use: No    Comment: Prior cocaine use but none since 2000    ROS: Constitutional:   Negative for fever, chills, weight loss CV: Negative for chest pain, previous MI, hypertension Respiratory:  Negative for shortness of breath, wheezing, sleep apnea, frequent cough GI:  Negative for nausea, vomiting, bloody stool, GERD  Physical exam: There were no vitals taken for this visit. GENERAL APPEARANCE:  Well appearing, well developed, well nourished, NAD HEENT:  Atraumatic, normocephalic, oropharynx clear NECK:  Supple without lymphadenopathy or thyromegaly ABDOMEN:  Soft, non-tender, no masses EXTREMITIES:  Moves all extremities well, without clubbing, cyanosis, or edema NEUROLOGIC:  Alert and oriented x 3, normal gait, CN II-XII grossly intact MENTAL STATUS:  appropriate BACK:  Non-tender to palpation, No CVAT SKIN:  Warm, dry, and  intact  Results: U/A:

## 2023-01-14 ENCOUNTER — Ambulatory Visit: Payer: BC Managed Care – PPO

## 2023-01-14 DIAGNOSIS — M5459 Other low back pain: Secondary | ICD-10-CM

## 2023-01-14 DIAGNOSIS — M546 Pain in thoracic spine: Secondary | ICD-10-CM

## 2023-01-14 DIAGNOSIS — M6281 Muscle weakness (generalized): Secondary | ICD-10-CM

## 2023-01-14 DIAGNOSIS — R262 Difficulty in walking, not elsewhere classified: Secondary | ICD-10-CM

## 2023-01-14 NOTE — Therapy (Signed)
Treatment Note   Patient Name: Nicholas Potts MRN: [id number] DOB:05-25-1961, 62 y.o., male Today's Date: 01/14/2023   END OF SESSION:  PT End of Session - 01/14/23 1139     Visit Number 16    Number of Visits 21    Date for PT Re-Evaluation 02/13/23    Authorization Type [employer], 30 visit limit PT/OT/ST    PT Start Time 1133    PT Stop Time 1211    PT Time Calculation (min) 38 min    Equipment Utilized During Treatment Back brace    Activity Tolerance Patient tolerated treatment well;Patient limited by pain    Behavior During Therapy WFL for tasks assessed/performed                Past Medical History:  Diagnosis Date   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HCC) 2014   post op    ARF (acute respiratory failure) (HCC) 2014   Blood dyscrasia    Chronic kidney disease 2014   ARF    Complication of anesthesia 2014   - post op - in combination of pain pills-  Cardiac and Respiratory Arrest- prior to sleep apnea diagonosis   Constipation    CVA (cerebral infarction)    Hyperlipidemia    Hypertension    OSA (obstructive sleep apnea)    Pre-diabetes    Short-term memory loss    Sleep apnea    Stress-induced cardiomyopathy -- RESOLVED    Post-op Narcotic induced Acute Hypoxic respiratory failure with respiratory and PEA cardiac arrest following accidental narcotic overdose; initial [place] 20-20%, now improved to 55%   Was supported with Impella Cardiac Cath - non-obstructive CAD ; 1 month after event - EF up from 25% to 55-60% - stable x 5 yrs   Thrombocytopenia (HCC) 2014   Past Surgical History:  Procedure Laterality Date   ACHILLES TENDON REPAIR     BIOPSY  08/01/2019   Procedure: BIOPSY;  Surgeon: Napoleon Form, MD;  Location: [facility];  Service: Endoscopy;;   BIOPSY  08/05/2020   Procedure: BIOPSY;  Surgeon: Napoleon Form, MD;  Location: [facility];  Service: Endoscopy;;   CARDIAC CATHETERIZATION  12/29/2012   RN LHC --> Impella. for severe shock with  low EF. Mild to moderate RV pressure elevation. Normal coronary arteries. Cardiac Output 2.5, Index 1.46.   COLONOSCOPY WITH PROPOFOL N/A 10/30/2015   Procedure: COLONOSCOPY WITH PROPOFOL;  Surgeon: Napoleon Form, MD;  Location: [facility];  Service: Endoscopy;  Laterality: N/A;   COLONOSCOPY WITH PROPOFOL N/A 08/01/2019   Procedure: COLONOSCOPY WITH PROPOFOL;  Surgeon: Napoleon Form, MD;  Location: [facility];  Service: Endoscopy;  Laterality: N/A;   COLONOSCOPY WITH PROPOFOL N/A 08/05/2020   Procedure: COLONOSCOPY WITH PROPOFOL;  Surgeon: Napoleon Form, MD;  Location: [facility];  Service: Endoscopy;  Laterality: N/A;   INSERTION OF DIALYSIS CATHETER Right 01/09/2013   Procedure: INSERTION OF DIALYSIS CATHETER;  Surgeon: Chuck Hint, MD;  Location: [facility] OR;  Service: Vascular;  Laterality: Right;   LAMINECTOMY WITH POSTERIOR LATERAL ARTHRODESIS LEVEL 3 N/A 10/15/2022   Procedure: THORACIC NINE-THORACIC TWELVE POSTERIOR INSTRUMENTED FUSION REDUCTION OF FRACTURE;  Surgeon: Bedelia Person, MD;  Location: [facility] OR;  Service: Neurosurgery;  Laterality: N/A;   LEFT AND RIGHT HEART CATHETERIZATION WITH CORONARY ANGIOGRAM  12/29/2012   Procedure: LEFT AND RIGHT HEART CATHETERIZATION WITH CORONARY ANGIOGRAM;  Surgeon: Tonny Bollman, MD;  Location: [facility] CATH LAB;  Service: Cardiovascular;;   POLYPECTOMY  08/01/2019   Procedure: POLYPECTOMY;  Surgeon: Napoleon Form, MD;  Location: [facility];  Service: Endoscopy;;   POLYPECTOMY  08/05/2020   Procedure: POLYPECTOMY;  Surgeon: Napoleon Form, MD;  Location: [facility];  Service: Endoscopy;;   TRANSTHORACIC ECHOCARDIOGRAM  12/29/2012   EF 25% with periapical HK/AKA.  --> Followup echo 7/25 -- EF of 35%   TRANSTHORACIC ECHOCARDIOGRAM  01/2013 ; 07/2015   a) EF 55-60%. No regional WMA. Grade 1 diastolic dysfunction. Mild LA dilatation.; b) Normal LV size with mild LV hypertrophy. EF 55-60%. Normal RV   TRANSTHORACIC  ECHOCARDIOGRAM  03/2018   EF 55-60%.  NO RWMA. Normal valves   Patient Active Problem List   Diagnosis Date Noted   Injury of thoracic spine (HCC) 10/22/2022   Dorsal (thoracic) vertebral fracture (HCC) 10/16/2022   Injury 10/15/2022   Urinary urgency 07/10/2022   Urinary frequency 07/10/2022   Morbid obesity with BMI of 40.0-44.9, adult (HCC) 09/10/2021   Fatigue 09/10/2021   History of colonic polyps    Polyp of ascending colon    Polyp of transverse colon    Polyp of cecum    Polyp of descending colon    Polyp of sigmoid colon    Hypersomnia 11/29/2017   Other headache syndrome 05/05/2017   Vertigo 05/05/2017   IFG (impaired fasting glucose) 08/21/2016   Obesity (BMI 30-39.9) 06/23/2013   Stress-induced cardiomyopathy -- essentially resolved    CVA (cerebral infarction)    OSA (obstructive sleep apnea)    Hyperlipidemia    Physical deconditioning 01/24/2013   Tinea pedis 01/18/2013   Essential hypertension 01/13/2013   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   ARF (acute renal failure) with tubular necrosis requiring HD 01/11/2013   Anemia 01/11/2013   Thrombocytopenia, unspecified (HCC) 01/11/2013   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 12/29/2012    PCP: Shade Flood, MD  REFERRING PROVIDER: Genice Rouge, MD  REFERRING DIAG: Unspecified fracture of unspecified thoracic vertebra, initial encounter for closed fracture [S22.009A], Unspecified injury at unspecified level of thoracic spinal cord, initial encounter [S24.109A]   Rationale for Evaluation and Treatment: Rehabilitation  THERAPY DIAG:  Pain in thoracic spine  Other low back pain  Muscle weakness (generalized)  Difficulty in walking, not elsewhere classified  ONSET DATE: 10/15/22  SUBJECTIVE:                                                                                                                                                                                            SUBJECTIVE STATEMENT:  Patient denies any worsening of symptoms.   Interpretation provided by pt's daughter today per  their preference; interpretation services denied.    PERTINENT HISTORY:   OP Date: 10/15/2022  2 weeks 10/29/2022  4 weeks 11/12/2022  6 weeks 11/26/2022  8 weeks 12/10/2022  10 weeks 12/24/2022  12 weeks 01/07/2023    open reduction of T10-11 fracture and posterior lateral arthrodesis of T9-03-19-11  Per note on 7/9: Patient and his daughter report that he went to see physician who updated his activity restrictions: "He can do more core strengthening, but no repetitive forward bending, sidebending, or twisting. He can lift more, but no more than 12#."   PMHx includes ARF, CKD, CVA, HTN, OSA, Obesity, Vertigo, Anemia, hx of cardiac and respiratory arrest related to unintentional narcotic overdose   PAIN:  Are you having pain? Yes: NPRS scale: 6/10 Pain location: along surgical site Pain description: sharp Aggravating factors: none identified at eval  Relieving factors: pain medication  PRECAUTIONS: Other: No lifting more than 1 gallon, no twisting, no bending forward.   WEIGHT BEARING RESTRICTIONS:  lifting restrictions as noted in precautions  FALLS:  Has patient fallen in last 6 months? No  LIVING ENVIRONMENT: Lives with: lives with their family Lives in: House/apartment Stairs: Yes: External: 5 steps; can reach both Has following equipment at home: Dan Humphreys - 2 wheeled  OCCUPATION: lifting/assembling furniture Health visitor)   PLOF: Independent and Independent with basic ADLs  PATIENT GOALS: return to PLOF, including work   NEXT MD VISIT: 11/26/22 to cardiology, 12/03/22 to PCP   OBJECTIVE:   DIAGNOSTIC FINDINGS:  10/15/22 DG THORACOLUMBAR SPINE (post op)  FINDINGS: Pedicle screws are noted at T9, T10, T11 and T12. Posterior fixation elements are not visualized at this time.   IMPRESSION: Pedicle screw placement from  T9-T12 for fixation surrounding a distraction injury at T10-T11.  PATIENT SURVEYS:  FOTO 27 current, 49 predicted  SCREENING FOR RED FLAGS: Bowel or bladder incontinence: Yes: present before accident related to CKD  Spinal tumors: No Cauda equina syndrome: No Compression fracture: No Abdominal aneurysm: No  COGNITION: Overall cognitive status: Within functional limits for tasks assessed      POSTURE: rounded shoulders, forward head, and use of TLSO limited further postural assessment  PALPATION: Not assessed at eval   LUMBAR ROM: not tested at eval d/t post op status   AROM eval  Flexion   Extension   Right lateral flexion   Left lateral flexion   Right rotation   Left rotation    (Blank rows = not tested)  Upper extremity MMT grossly: 4+/5 MMT bilaterally   LOWER EXTREMITY MMT:    MMT Right eval Left eval  Hip flexion 4+ 4+  Hip extension    Hip abduction 4+ 4+  Hip adduction 4 4  Hip internal rotation    Hip external rotation    Knee flexion 5 5  Knee extension 5 5  Ankle dorsiflexion 5 5  Ankle plantarflexion    Ankle inversion    Ankle eversion     (Blank rows = not tested)   FUNCTIONAL TESTS:  6 minute walk test: 425 ft  GAIT: Distance walked: 425 Assistive device utilized: Walker - 2 wheeled Level of assistance: Modified independence Comments: forward flexed posture, requires cueing for shoulder depression and normal breathing   TODAY'S TREATMENT:    [facility] Adult PT Treatment:  DATE: 01/14/2023  Therapeutic Exercise: Standing in //  bars with bil support Heel raise - 30x, on airex Toe raise - 30x, on airex Taps to 5000g ball 2 x 60", standing on airex pad SL circles on 5000g ball CW/CCW, x15 each BIL  Therapeutic Activity:  6" step ups, 2 x 10  6" lateral step ups, x 5 BIL  Mini squats 2x10 BIL UE support standing on airex pad  Obstacle Course: Hurdle step over (4 hurdles), with airex pad  at end of hurdles, 4" step, x 2 fwd/2 lat  [facility] Adult PT Treatment:                                                DATE: 01/12/2023  Therapeutic Exercise: Standing in //  bars with bil support Heel raise - 30x Toe raise - 30x 6" step ups, 2 x 10  6" lateral step ups, x 10 BIL  Taps to 5000g ball 2 x 60" SL circles on 5000g ball CW/CCW, x15 each BIL   [facility] Adult PT Treatment:                                                DATE: 01/07/2023  Therapeutic Exercise: Standing in //  bars with bil support Heel raise - 30x Toe raise - 30x Obstacle Course: Hurdle step over (2 hurdles), with airex pad in between hurdles, 4" step, x 2 fwd/2 lat Mini squats 2x10 BIL UE support Taps to 5000g ball 2 x 60" SL circles on 5000g ball CW/CCW, 2 x10 each BIL Nustep level 3 x 5 minutes at end of session for cool down and pain modulation effects   [facility] Adult PT Treatment:                                                DATE: 01/05/23  Therapeutic Exercise: Standing in //  bars with bil support Heel raise - 30x Toe raise - 30x Hurdle step over (2 hurdles) 2x fwd, 2x lateral, with airex pad in between hurdles (increased pain) Step ups 4" x10 BIL fwd/lat Mini squats 2x10 BIL UE support Taps to 3000g ball 3x30" SL circles on 3000g ball CW/CCW x10 each BIL    HOME EXERCISE PROGRAM: Access Code: [facility] URL: [email or web address] Date: 12/15/2022 Prepared by: Mauri Reading  Exercises - Seated Diaphragmatic Breathing  - 3 x daily - 7 x weekly - Seated Transversus Abdominis Bracing  - 5 x daily - 7 x weekly - 1 sets - 10 reps - 5 seg hold - Seated Hip Adduction Isometrics with Ball  - 5 x daily - 7 x weekly - 2 sets - 10 reps - 5 seg hold - Standing March with Counter Support  - 5 x daily - 7 x weekly - 2 sets - 5-10 reps - Standing Hip Abduction with Counter Support  - 5 x daily - 7 x weekly - 2 sets - 10 reps   ASSESSMENT:  CLINICAL IMPRESSION: Nicholas Potts was able to progress with  additional time standing on compliant surface with exercises. However, he continues to  require frequent seated rest breaks d/t ongoing challenge with standing tolerance. We will continue to progress activities as appropriate for core strengthening.   OBJECTIVE IMPAIRMENTS: cardiopulmonary status limiting activity, decreased activity tolerance, decreased balance, decreased endurance, decreased mobility, difficulty walking, decreased strength, and pain.   ACTIVITY LIMITATIONS: carrying, lifting, bending, standing, squatting, sleeping, stairs, transfers, bed mobility, bathing, toileting, dressing, hygiene/grooming, and locomotion level  PARTICIPATION LIMITATIONS: meal prep, cleaning, interpersonal relationship, driving, shopping, community activity, occupation, and yard work  PERSONAL FACTORS: Past/current experiences, Profession, and 3+ comorbidities: PMHx includes CKD, CVA, HTN, OSA, Obesity   are also affecting patient's functional outcome.   REHAB POTENTIAL: Fair    CLINICAL DECISION MAKING: Evolving/moderate complexity  EVALUATION COMPLEXITY: Moderate   GOALS: Goals reviewed with patient? Yes  SHORT TERM GOALS: Target date: 12/19/2022  Patient will be independent with initial home program for core mm activation, and endurance program.  Baseline: provided at eval; to be progressed over next few tx sessions Goal status: MET   LONG TERM GOALS: Target date: 02/13/2023   Patient will report improved overall functional ability with FOTO score of 49 or higher.   Baseline: 27 Goal status: INITIAL  2.  Patient will demonstrate ability to ascend/descend stairs and inclined surfaces without pain or difficulty, using reciprocal pattern.  Goal status: INITIAL  3.  Patient will report that he is able to perform all ADLs and IADLs without assistance.  Goal status: INITIAL  4.  Patient will report ability to safely lifting at least 30# from floor to waist, and waist to counter height in order  to ensure safe return to work activities.  Goal status: INITIAL   PLAN:  PT FREQUENCY: 2x/week, decrease freq to 1x/week after first 8 weeks.   PT DURATION: 12 weeks  PLANNED INTERVENTIONS: Therapeutic exercises, Therapeutic activity, Neuromuscular re-education, Balance training, Gait training, Patient/Family education, Self Care, Stair training, Aquatic Therapy, Cryotherapy, Moist heat, Manual therapy, and Re-evaluation.  PLAN FOR NEXT SESSION: continue progress of neutral spine exercises (consider supine program)  LE endurance and pt education regarding activity modifications as appropriate   Mauri Reading, PT, DPT 01/14/2023, 1:43 PM

## 2023-01-15 ENCOUNTER — Encounter: Payer: Self-pay | Admitting: Internal Medicine

## 2023-01-15 DIAGNOSIS — G4734 Idiopathic sleep related nonobstructive alveolar hypoventilation: Secondary | ICD-10-CM

## 2023-01-15 HISTORY — DX: Idiopathic sleep related nonobstructive alveolar hypoventilation: G47.34

## 2023-01-15 NOTE — Assessment & Plan Note (Signed)
Former smoker with body habitus consistent with obesity hypoventilation Plan- adding O2 2L through BIPAP for sleep

## 2023-01-15 NOTE — Assessment & Plan Note (Signed)
Residual fatigue but does benefit from BIPAP Plan- continue BIPAP 18/11, increase modafinil to 400 mg daily

## 2023-01-19 ENCOUNTER — Ambulatory Visit: Payer: BC Managed Care – PPO

## 2023-01-19 DIAGNOSIS — M5459 Other low back pain: Secondary | ICD-10-CM

## 2023-01-19 DIAGNOSIS — M546 Pain in thoracic spine: Secondary | ICD-10-CM

## 2023-01-19 DIAGNOSIS — M6281 Muscle weakness (generalized): Secondary | ICD-10-CM

## 2023-01-19 DIAGNOSIS — R262 Difficulty in walking, not elsewhere classified: Secondary | ICD-10-CM

## 2023-01-19 NOTE — Therapy (Signed)
Treatment Note   Patient Name: Nicholas Potts MRN: [id number] DOB:May 06, 1961, 62 y.o., male Today's Date: 01/19/2023   END OF SESSION:  PT End of Session - 01/19/23 1141     Visit Number 17    Number of Visits 21    Date for PT Re-Evaluation 02/13/23    Authorization Type [employer], 30 visit limit PT/OT/ST    Authorization - Visit Number --    PT Start Time 1151   Pt arrived late   PT Stop Time 1215    PT Time Calculation (min) 24 min    Equipment Utilized During Treatment Back brace    Activity Tolerance Patient tolerated treatment well;Patient limited by pain    Behavior During Therapy WFL for tasks assessed/performed                 Past Medical History:  Diagnosis Date   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HCC) 2014   post op    ARF (acute respiratory failure) (HCC) 2014   Blood dyscrasia    Chronic kidney disease 2014   ARF    Complication of anesthesia 2014   - post op - in combination of pain pills-  Cardiac and Respiratory Arrest- prior to sleep apnea diagonosis   Constipation    CVA (cerebral infarction)    Hyperlipidemia    Hypertension    Nocturnal hypoxemia 01/15/2023   OSA (obstructive sleep apnea)    Pre-diabetes    Short-term memory loss    Sleep apnea    Stress-induced cardiomyopathy -- RESOLVED    Post-op Narcotic induced Acute Hypoxic respiratory failure with respiratory and PEA cardiac arrest following accidental narcotic overdose; initial [place] 20-20%, now improved to 55%   Was supported with Impella Cardiac Cath - non-obstructive CAD ; 1 month after event - EF up from 25% to 55-60% - stable x 5 yrs   Thrombocytopenia (HCC) 2014   Past Surgical History:  Procedure Laterality Date   ACHILLES TENDON REPAIR     BIOPSY  08/01/2019   Procedure: BIOPSY;  Surgeon: Napoleon Form, MD;  Location: [facility];  Service: Endoscopy;;   BIOPSY  08/05/2020   Procedure: BIOPSY;  Surgeon: Napoleon Form, MD;  Location: [facility];  Service:  Endoscopy;;   CARDIAC CATHETERIZATION  12/29/2012   RN LHC --> Impella. for severe shock with low EF. Mild to moderate RV pressure elevation. Normal coronary arteries. Cardiac Output 2.5, Index 1.46.   COLONOSCOPY WITH PROPOFOL N/A 10/30/2015   Procedure: COLONOSCOPY WITH PROPOFOL;  Surgeon: Napoleon Form, MD;  Location: [facility];  Service: Endoscopy;  Laterality: N/A;   COLONOSCOPY WITH PROPOFOL N/A 08/01/2019   Procedure: COLONOSCOPY WITH PROPOFOL;  Surgeon: Napoleon Form, MD;  Location: [facility];  Service: Endoscopy;  Laterality: N/A;   COLONOSCOPY WITH PROPOFOL N/A 08/05/2020   Procedure: COLONOSCOPY WITH PROPOFOL;  Surgeon: Napoleon Form, MD;  Location: [facility];  Service: Endoscopy;  Laterality: N/A;   INSERTION OF DIALYSIS CATHETER Right 01/09/2013   Procedure: INSERTION OF DIALYSIS CATHETER;  Surgeon: Chuck Hint, MD;  Location: [facility] OR;  Service: Vascular;  Laterality: Right;   LAMINECTOMY WITH POSTERIOR LATERAL ARTHRODESIS LEVEL 3 N/A 10/15/2022   Procedure: THORACIC NINE-THORACIC TWELVE POSTERIOR INSTRUMENTED FUSION REDUCTION OF FRACTURE;  Surgeon: Bedelia Person, MD;  Location[facility];  Service: Neurosurgery;  Laterality: N/A;   LEFT AND RIGHT HEART CATHETERIZATION WITH CORONARY ANGIOGRAM  12/29/2012   Procedure: LEFT AND RIGHT HEART CATHETERIZATION WITH CORONARY ANGIOGRAM;  Surgeon: Tonny Bollman,  MD;  Location: [facility];  Service: Cardiovascular;;   POLYPECTOMY  08/01/2019   Procedure: POLYPECTOMY;  Surgeon: Napoleon Form, MD;  Location: [facility];  Service: Endoscopy;;   POLYPECTOMY  08/05/2020   Procedure: POLYPECTOMY;  Surgeon: Napoleon Form, MD;  Location: [facility];  Service: Endoscopy;;   TRANSTHORACIC ECHOCARDIOGRAM  12/29/2012   EF 25% with periapical HK/AKA.  --> Followup echo 7/25 -- EF of 35%   TRANSTHORACIC ECHOCARDIOGRAM  01/2013 ; 07/2015   a) EF 55-60%. No regional WMA. Grade 1 diastolic dysfunction. Mild LA  dilatation.; b) Normal LV size with mild LV hypertrophy. EF 55-60%. Normal RV   TRANSTHORACIC ECHOCARDIOGRAM  03/2018   EF 55-60%.  NO RWMA. Normal valves   Patient Active Problem List   Diagnosis Date Noted   Nocturnal hypoxemia 01/15/2023   Injury of thoracic spine (HCC) 10/22/2022   Dorsal (thoracic) vertebral fracture (HCC) 10/16/2022   Injury 10/15/2022   Urinary urgency 07/10/2022   Urinary frequency 07/10/2022   Morbid obesity with BMI of 40.0-44.9, adult (HCC) 09/10/2021   Fatigue 09/10/2021   History of colonic polyps    Polyp of ascending colon    Polyp of transverse colon    Polyp of cecum    Polyp of descending colon    Polyp of sigmoid colon    Hypersomnia 11/29/2017   Other headache syndrome 05/05/2017   Vertigo 05/05/2017   IFG (impaired fasting glucose) 08/21/2016   Obesity (BMI 30-39.9) 06/23/2013   Stress-induced cardiomyopathy -- essentially resolved    CVA (cerebral infarction)    OSA (obstructive sleep apnea)    Hyperlipidemia    Physical deconditioning 01/24/2013   Tinea pedis 01/18/2013   Essential hypertension 01/13/2013   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   ARF (acute renal failure) with tubular necrosis requiring HD 01/11/2013   Anemia 01/11/2013   Thrombocytopenia, unspecified (HCC) 01/11/2013   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 12/29/2012    PCP: Shade Flood, MD  REFERRING PROVIDER: Genice Rouge, MD  REFERRING DIAG: Unspecified fracture of unspecified thoracic vertebra, initial encounter for closed fracture [S22.009A], Unspecified injury at unspecified level of thoracic spinal cord, initial encounter [S24.109A]   Rationale for Evaluation and Treatment: Rehabilitation  THERAPY DIAG:  Pain in thoracic spine  Other low back pain  Muscle weakness (generalized)  Difficulty in walking, not elsewhere classified  ONSET DATE: 10/15/22  SUBJECTIVE:  SUBJECTIVE STATEMENT:  Patient reports increased pain when he takes a deep breath. He states he has a follow up appointment next Friday with the surgeon.   Interpretation provided by pt's daughter today per their preference; interpretation services denied.    PERTINENT HISTORY:   OP Date: 10/15/2022  2 weeks 10/29/2022  4 weeks 11/12/2022  6 weeks 11/26/2022  8 weeks 12/10/2022  10 weeks 12/24/2022  12 weeks 01/07/2023    open reduction of T10-11 fracture and posterior lateral arthrodesis of T9-03-19-11  Per note on 7/9: Patient and his daughter report that he went to see physician who updated his activity restrictions: "He can do more core strengthening, but no repetitive forward bending, sidebending, or twisting. He can lift more, but no more than 12#."   PMHx includes ARF, CKD, CVA, HTN, OSA, Obesity, Vertigo, Anemia, hx of cardiac and respiratory arrest related to unintentional narcotic overdose   PAIN:  Are you having pain? Yes: NPRS scale: 7/10 Pain location: along surgical site Pain description: sharp Aggravating factors: none identified at eval  Relieving factors: pain medication  PRECAUTIONS: Other: No lifting more than 1 gallon, no twisting, no bending forward.   WEIGHT BEARING RESTRICTIONS:  lifting restrictions as noted in precautions  FALLS:  Has patient fallen in last 6 months? No  LIVING ENVIRONMENT: Lives with: lives with their family Lives in: House/apartment Stairs: Yes: External: 5 steps; can reach both Has following equipment at home: Dan Humphreys - 2 wheeled  OCCUPATION: lifting/assembling furniture Health visitor)   PLOF: Independent and Independent with basic ADLs  PATIENT GOALS: return to PLOF, including work   NEXT MD VISIT: 11/26/22 to cardiology, 12/03/22 to PCP   OBJECTIVE:   DIAGNOSTIC  FINDINGS:  10/15/22 DG THORACOLUMBAR SPINE (post op)  FINDINGS: Pedicle screws are noted at T9, T10, T11 and T12. Posterior fixation elements are not visualized at this time.   IMPRESSION: Pedicle screw placement from T9-T12 for fixation surrounding a distraction injury at T10-T11.  PATIENT SURVEYS:  FOTO 27 current, 49 predicted  SCREENING FOR RED FLAGS: Bowel or bladder incontinence: Yes: present before accident related to CKD  Spinal tumors: No Cauda equina syndrome: No Compression fracture: No Abdominal aneurysm: No  COGNITION: Overall cognitive status: Within functional limits for tasks assessed      POSTURE: rounded shoulders, forward head, and use of TLSO limited further postural assessment  PALPATION: Not assessed at eval   LUMBAR ROM: not tested at eval d/t post op status   AROM eval  Flexion   Extension   Right lateral flexion   Left lateral flexion   Right rotation   Left rotation    (Blank rows = not tested)  Upper extremity MMT grossly: 4+/5 MMT bilaterally   LOWER EXTREMITY MMT:    MMT Right eval Left eval  Hip flexion 4+ 4+  Hip extension    Hip abduction 4+ 4+  Hip adduction 4 4  Hip internal rotation    Hip external rotation    Knee flexion 5 5  Knee extension 5 5  Ankle dorsiflexion 5 5  Ankle plantarflexion    Ankle inversion    Ankle eversion     (Blank rows = not tested)   FUNCTIONAL TESTS:  6 minute walk test: 425 ft  GAIT: Distance walked: 425 Assistive device utilized: Walker - 2 wheeled Level of assistance: Modified independence Comments: forward flexed posture, requires cueing for shoulder depression and normal breathing   TODAY'S TREATMENT:  [facility] Adult PT Treatment:  DATE: 01/19/23 Therapeutic Exercise: Standing in //  bars with bil support Heel raise - 30x, on airex Toe raise - 30x, on airex Taps to 5000g ball x 60", standing on airex pad SL circles on 5000g ball  CW/CCW, x30 each BIL Therapeutic Activity:  6" step ups airex on top , x 10 BIL (L without Airex, too high) Mini squats 2x10 BIL UE support standing on airex pad    [facility] Adult PT Treatment:                                                DATE: 01/14/2023  Therapeutic Exercise: Standing in //  bars with bil support Heel raise - 30x, on airex Toe raise - 30x, on airex Taps to 5000g ball 2 x 60", standing on airex pad SL circles on 5000g ball CW/CCW, x15 each BIL  Therapeutic Activity:  6" step ups, 2 x 10  6" lateral step ups, x 5 BIL  Mini squats 2x10 BIL UE support standing on airex pad  Obstacle Course: Hurdle step over (4 hurdles), with airex pad at end of hurdles, 4" step, x 2 fwd/2 lat  [facility] Adult PT Treatment:                                                DATE: 01/12/2023  Therapeutic Exercise: Standing in //  bars with bil support Heel raise - 30x Toe raise - 30x 6" step ups, 2 x 10  6" lateral step ups, x 10 BIL  Taps to 5000g ball 2 x 60" SL circles on 5000g ball CW/CCW, x15 each BIL    HOME EXERCISE PROGRAM: Access Code: [facility] URL: [email or web address] Date: 12/15/2022 Prepared by: Mauri Reading  Exercises - Seated Diaphragmatic Breathing  - 3 x daily - 7 x weekly - Seated Transversus Abdominis Bracing  - 5 x daily - 7 x weekly - 1 sets - 10 reps - 5 seg hold - Seated Hip Adduction Isometrics with Ball  - 5 x daily - 7 x weekly - 2 sets - 10 reps - 5 seg hold - Standing March with Counter Support  - 5 x daily - 7 x weekly - 2 sets - 5-10 reps - Standing Hip Abduction with Counter Support  - 5 x daily - 7 x weekly - 2 sets - 10 reps   ASSESSMENT:  CLINICAL IMPRESSION: Patient presents to PT 20 minutes late, truncating session, reporting 7/10 pain and that he has increased pain when he takes a deep breath. Advised patient to follow up with MD if this persists or gets worse. Session today continued to focus on strengthening and balance as able,  he is more limited by pain today. Patient continues to benefit from skilled PT services and should be progressed as able to improve functional independence.    OBJECTIVE IMPAIRMENTS: cardiopulmonary status limiting activity, decreased activity tolerance, decreased balance, decreased endurance, decreased mobility, difficulty walking, decreased strength, and pain.   ACTIVITY LIMITATIONS: carrying, lifting, bending, standing, squatting, sleeping, stairs, transfers, bed mobility, bathing, toileting, dressing, hygiene/grooming, and locomotion level  PARTICIPATION LIMITATIONS: meal prep, cleaning, interpersonal relationship, driving, shopping, community activity, occupation, and yard work  PERSONAL FACTORS: Past/current experiences, Profession, and 3+ comorbidities:  PMHx includes CKD, CVA, HTN, OSA, Obesity   are also affecting patient's functional outcome.   REHAB POTENTIAL: Fair    CLINICAL DECISION MAKING: Evolving/moderate complexity  EVALUATION COMPLEXITY: Moderate   GOALS: Goals reviewed with patient? Yes  SHORT TERM GOALS: Target date: 12/19/2022  Patient will be independent with initial home program for core mm activation, and endurance program.  Baseline: provided at eval; to be progressed over next few tx sessions Goal status: MET   LONG TERM GOALS: Target date: 02/13/2023   Patient will report improved overall functional ability with FOTO score of 49 or higher.   Baseline: 27 Goal status: INITIAL  2.  Patient will demonstrate ability to ascend/descend stairs and inclined surfaces without pain or difficulty, using reciprocal pattern.  Goal status: INITIAL  3.  Patient will report that he is able to perform all ADLs and IADLs without assistance.  Goal status: INITIAL  4.  Patient will report ability to safely lifting at least 30# from floor to waist, and waist to counter height in order to ensure safe return to work activities.  Goal status: INITIAL   PLAN:  PT  FREQUENCY: 2x/week, decrease freq to 1x/week after first 8 weeks.   PT DURATION: 12 weeks  PLANNED INTERVENTIONS: Therapeutic exercises, Therapeutic activity, Neuromuscular re-education, Balance training, Gait training, Patient/Family education, Self Care, Stair training, Aquatic Therapy, Cryotherapy, Moist heat, Manual therapy, and Re-evaluation.  PLAN FOR NEXT SESSION: continue progress of neutral spine exercises (consider supine program)  LE endurance and pt education regarding activity modifications as appropriate   Berta Minor, PTA 01/19/2023, 12:19 PM

## 2023-01-21 ENCOUNTER — Ambulatory Visit: Payer: BC Managed Care – PPO | Admitting: Physical Therapy

## 2023-01-21 ENCOUNTER — Encounter: Payer: Self-pay | Admitting: Physical Therapy

## 2023-01-21 DIAGNOSIS — R262 Difficulty in walking, not elsewhere classified: Secondary | ICD-10-CM

## 2023-01-21 DIAGNOSIS — M6281 Muscle weakness (generalized): Secondary | ICD-10-CM

## 2023-01-21 DIAGNOSIS — M546 Pain in thoracic spine: Secondary | ICD-10-CM | POA: Diagnosis not present

## 2023-01-21 DIAGNOSIS — M5459 Other low back pain: Secondary | ICD-10-CM

## 2023-01-21 DIAGNOSIS — S22078A Other fracture of T9-T10 vertebra, initial encounter for closed fracture: Secondary | ICD-10-CM

## 2023-01-21 NOTE — Therapy (Signed)
Treatment Note   Patient Name: Nicholas Potts MRN: [id number] DOB:1961-03-02, 62 y.o., male Today's Date: 01/21/2023   END OF SESSION:  PT End of Session - 01/21/23 1023     Visit Number 18    Number of Visits 21    Date for PT Re-Evaluation 02/13/23    Authorization Type [employer], 30 visit limit PT/OT/ST    PT Start Time 1015    PT Stop Time 1100    PT Time Calculation (min) 45 min    Equipment Utilized During Treatment Back brace    Activity Tolerance Patient tolerated treatment well;Patient limited by pain    Behavior During Therapy WFL for tasks assessed/performed                 Past Medical History:  Diagnosis Date   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HCC) 2014   post op    ARF (acute respiratory failure) (HCC) 2014   Blood dyscrasia    Chronic kidney disease 2014   ARF    Complication of anesthesia 2014   - post op - in combination of pain pills-  Cardiac and Respiratory Arrest- prior to sleep apnea diagonosis   Constipation    CVA (cerebral infarction)    Hyperlipidemia    Hypertension    Nocturnal hypoxemia 01/15/2023   OSA (obstructive sleep apnea)    Pre-diabetes    Short-term memory loss    Sleep apnea    Stress-induced cardiomyopathy -- RESOLVED    Post-op Narcotic induced Acute Hypoxic respiratory failure with respiratory and PEA cardiac arrest following accidental narcotic overdose; initial [place] 20-20%, now improved to 55%   Was supported with Impella Cardiac Cath - non-obstructive CAD ; 1 month after event - EF up from 25% to 55-60% - stable x 5 yrs   Thrombocytopenia (HCC) 2014   Past Surgical History:  Procedure Laterality Date   ACHILLES TENDON REPAIR     BIOPSY  08/01/2019   Procedure: BIOPSY;  Surgeon: Napoleon Form, MD;  Location: [facility];  Service: Endoscopy;;   BIOPSY  08/05/2020   Procedure: BIOPSY;  Surgeon: Napoleon Form, MD;  Location: [facility];  Service: Endoscopy;;   CARDIAC CATHETERIZATION  12/29/2012   RN LHC  --> Impella. for severe shock with low EF. Mild to moderate RV pressure elevation. Normal coronary arteries. Cardiac Output 2.5, Index 1.46.   COLONOSCOPY WITH PROPOFOL N/A 10/30/2015   Procedure: COLONOSCOPY WITH PROPOFOL;  Surgeon: Napoleon Form, MD;  Location: [facility];  Service: Endoscopy;  Laterality: N/A;   COLONOSCOPY WITH PROPOFOL N/A 08/01/2019   Procedure: COLONOSCOPY WITH PROPOFOL;  Surgeon: Napoleon Form, MD;  Location: [facility];  Service: Endoscopy;  Laterality: N/A;   COLONOSCOPY WITH PROPOFOL N/A 08/05/2020   Procedure: COLONOSCOPY WITH PROPOFOL;  Surgeon: Napoleon Form, MD;  Location: [facility];  Service: Endoscopy;  Laterality: N/A;   INSERTION OF DIALYSIS CATHETER Right 01/09/2013   Procedure: INSERTION OF DIALYSIS CATHETER;  Surgeon: Chuck Hint, MD;  Location: [facility] OR;  Service: Vascular;  Laterality: Right;   LAMINECTOMY WITH POSTERIOR LATERAL ARTHRODESIS LEVEL 3 N/A 10/15/2022   Procedure: THORACIC NINE-THORACIC TWELVE POSTERIOR INSTRUMENTED FUSION REDUCTION OF FRACTURE;  Surgeon: Bedelia Person, MD;  Location: [facility] OR;  Service: Neurosurgery;  Laterality: N/A;   LEFT AND RIGHT HEART CATHETERIZATION WITH CORONARY ANGIOGRAM  12/29/2012   Procedure: LEFT AND RIGHT HEART CATHETERIZATION WITH CORONARY ANGIOGRAM;  Surgeon: Tonny Bollman, MD;  Location: [facility] CATH LAB;  Service: Cardiovascular;;   POLYPECTOMY  08/01/2019   Procedure: POLYPECTOMY;  Surgeon: Napoleon Form, MD;  Location: [facility];  Service: Endoscopy;;   POLYPECTOMY  08/05/2020   Procedure: POLYPECTOMY;  Surgeon: Napoleon Form, MD;  Location: [facility];  Service: Endoscopy;;   TRANSTHORACIC ECHOCARDIOGRAM  12/29/2012   EF 25% with periapical HK/AKA.  --> Followup echo 7/25 -- EF of 35%   TRANSTHORACIC ECHOCARDIOGRAM  01/2013 ; 07/2015   a) EF 55-60%. No regional WMA. Grade 1 diastolic dysfunction. Mild LA dilatation.; b) Normal LV size with mild LV hypertrophy. EF  55-60%. Normal RV   TRANSTHORACIC ECHOCARDIOGRAM  03/2018   EF 55-60%.  NO RWMA. Normal valves   Patient Active Problem List   Diagnosis Date Noted   Nocturnal hypoxemia 01/15/2023   Injury of thoracic spine (HCC) 10/22/2022   Dorsal (thoracic) vertebral fracture (HCC) 10/16/2022   Injury 10/15/2022   Urinary urgency 07/10/2022   Urinary frequency 07/10/2022   Morbid obesity with BMI of 40.0-44.9, adult (HCC) 09/10/2021   Fatigue 09/10/2021   History of colonic polyps    Polyp of ascending colon    Polyp of transverse colon    Polyp of cecum    Polyp of descending colon    Polyp of sigmoid colon    Hypersomnia 11/29/2017   Other headache syndrome 05/05/2017   Vertigo 05/05/2017   IFG (impaired fasting glucose) 08/21/2016   Obesity (BMI 30-39.9) 06/23/2013   Stress-induced cardiomyopathy -- essentially resolved    CVA (cerebral infarction)    OSA (obstructive sleep apnea)    Hyperlipidemia    Physical deconditioning 01/24/2013   Tinea pedis 01/18/2013   Essential hypertension 01/13/2013   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   ARF (acute renal failure) with tubular necrosis requiring HD 01/11/2013   Anemia 01/11/2013   Thrombocytopenia, unspecified (HCC) 01/11/2013   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 12/29/2012    PCP: Shade Flood, MD  REFERRING PROVIDER: Genice Rouge, MD  REFERRING DIAG: Unspecified fracture of unspecified thoracic vertebra, initial encounter for closed fracture [S22.009A], Unspecified injury at unspecified level of thoracic spinal cord, initial encounter [S24.109A]   Rationale for Evaluation and Treatment: Rehabilitation  THERAPY DIAG:  Pain in thoracic spine  Other low back pain  Muscle weakness (generalized)  Difficulty in walking, not elsewhere classified  Other closed fracture of ninth thoracic vertebra, initial encounter (HCC)  ONSET DATE: 10/15/22  SUBJECTIVE:  SUBJECTIVE STATEMENT:  Patient reports increased pain when he takes a deep breath. He states he has a follow up appointment next Friday with the surgeon.   Interpretation provided by pt's daughter today per their preference; interpretation services denied.    PERTINENT HISTORY:   OP Date: 10/15/2022  2 weeks 10/29/2022  4 weeks 11/12/2022  6 weeks 11/26/2022  8 weeks 12/10/2022  10 weeks 12/24/2022  12 weeks 01/07/2023    open reduction of T10-11 fracture and posterior lateral arthrodesis of T9-03-19-11  Per note on 7/9: Patient and his daughter report that he went to see physician who updated his activity restrictions: "He can do more core strengthening, but no repetitive forward bending, sidebending, or twisting. He can lift more, but no more than 12#."   PMHx includes ARF, CKD, CVA, HTN, OSA, Obesity, Vertigo, Anemia, hx of cardiac and respiratory arrest related to unintentional narcotic overdose   PAIN:  Are you having pain? Yes: NPRS scale: 7/10 Pain location: along surgical site Pain description: sharp Aggravating factors: none identified at eval  Relieving factors: pain medication  PRECAUTIONS: Other: No lifting more than 1 gallon, no twisting, no bending forward.   WEIGHT BEARING RESTRICTIONS:  lifting restrictions as noted in precautions  FALLS:  Has patient fallen in last 6 months? No  LIVING ENVIRONMENT: Lives with: lives with their family Lives in: House/apartment Stairs: Yes: External: 5 steps; can reach both Has following equipment at home: Dan Humphreys - 2 wheeled  OCCUPATION: lifting/assembling furniture Health visitor)   PLOF: Independent and Independent with basic ADLs  PATIENT GOALS: return to PLOF, including work   NEXT MD VISIT: 11/26/22 to cardiology, 12/03/22 to PCP   OBJECTIVE:    DIAGNOSTIC FINDINGS:  10/15/22 DG THORACOLUMBAR SPINE (post op)  FINDINGS: Pedicle screws are noted at T9, T10, T11 and T12. Posterior fixation elements are not visualized at this time.   IMPRESSION: Pedicle screw placement from T9-T12 for fixation surrounding a distraction injury at T10-T11.  PATIENT SURVEYS:  FOTO 27 current, 49 predicted  SCREENING FOR RED FLAGS: Bowel or bladder incontinence: Yes: present before accident related to CKD  Spinal tumors: No Cauda equina syndrome: No Compression fracture: No Abdominal aneurysm: No  COGNITION: Overall cognitive status: Within functional limits for tasks assessed      POSTURE: rounded shoulders, forward head, and use of TLSO limited further postural assessment  PALPATION: Not assessed at eval   LUMBAR ROM: not tested at eval d/t post op status   AROM eval  Flexion   Extension   Right lateral flexion   Left lateral flexion   Right rotation   Left rotation    (Blank rows = not tested)  Upper extremity MMT grossly: 4+/5 MMT bilaterally   LOWER EXTREMITY MMT:    MMT Right eval Left eval  Hip flexion 4+ 4+  Hip extension    Hip abduction 4+ 4+  Hip adduction 4 4  Hip internal rotation    Hip external rotation    Knee flexion 5 5  Knee extension 5 5  Ankle dorsiflexion 5 5  Ankle plantarflexion    Ankle inversion    Ankle eversion     (Blank rows = not tested)   FUNCTIONAL TESTS:  6 minute walk test: 425 ft  GAIT: Distance walked: 425 Assistive device utilized: Walker - 2 wheeled Level of assistance: Modified independence Comments: forward flexed posture, requires cueing for shoulder depression and normal breathing   TODAY'S TREATMENT:   [facility] Adult PT Treatment:  DATE: 01/21/23 Therapeutic Exercise: Nustep UE and LE 6 min  Standing step overs with obstacles forward and sidestepping Squats x 10  Step ups 8 inch x 15 each Seated blue band x 15  horizontal abd Ball overhead reach and anti-rotation on Airex  Self Care: FOTO, verbal review of bed mobility and rolling technique   [facility] Adult PT Treatment:                                                DATE: 01/19/23 Therapeutic Exercise: Standing in //  bars with bil support Heel raise - 30x, on airex Toe raise - 30x, on airex Taps to 5000g ball x 60", standing on airex pad SL circles on 5000g ball CW/CCW, x30 each BIL Therapeutic Activity:  6" step ups airex on top , x 10 BIL (L without Airex, too high) Mini squats 2x10 BIL UE support standing on airex pad    [facility] Adult PT Treatment:                                                DATE: 01/14/2023  Therapeutic Exercise: Standing in //  bars with bil support Heel raise - 30x, on airex Toe raise - 30x, on airex Taps to 5000g ball 2 x 60", standing on airex pad SL circles on 5000g ball CW/CCW, x15 each BIL  Therapeutic Activity:  6" step ups, 2 x 10  6" lateral step ups, x 5 BIL  Mini squats 2x10 BIL UE support standing on airex pad  Obstacle Course: Hurdle step over (4 hurdles), with airex pad at end of hurdles, 4" step, x 2 fwd/2 lat  [facility] Adult PT Treatment:                                                DATE: 01/12/2023  Therapeutic Exercise: Standing in //  bars with bil support Heel raise - 30x Toe raise - 30x 6" step ups, 2 x 10  6" lateral step ups, x 10 BIL  Taps to 5000g ball 2 x 60" SL circles on 5000g ball CW/CCW, x15 each BIL    HOME EXERCISE PROGRAM: Access Code: [facility] URL: [email or web address] Date: 12/15/2022 Prepared by: Mauri Reading  Exercises - Seated Diaphragmatic Breathing  - 3 x daily - 7 x weekly - Seated Transversus Abdominis Bracing  - 5 x daily - 7 x weekly - 1 sets - 10 reps - 5 seg hold - Seated Hip Adduction Isometrics with Ball  - 5 x daily - 7 x weekly - 2 sets - 10 reps - 5 seg hold - Standing March with Counter Support  - 5 x daily - 7 x weekly - 2 sets - 5-10  reps - Standing Hip Abduction with Counter Support  - 5 x daily - 7 x weekly - 2 sets - 10 reps   ASSESSMENT:  CLINICAL IMPRESSION: Patient was able to work on standing balance and intervals of work in the parallel bars, pain towards 7/10-8/10.  He arrived a bit late.  FOTO score improved to 38%.  He reports  having increased pain after exercising at PT when taking deep breaths.  He also reports pain when transitioning from supine to sit. He denies LE pain.  He is able to walk without the walker at home for about 15 min at a time.  He will continue to benefit from skilled PT in order to resume normal ADLs and mobility.    OBJECTIVE IMPAIRMENTS: cardiopulmonary status limiting activity, decreased activity tolerance, decreased balance, decreased endurance, decreased mobility, difficulty walking, decreased strength, and pain.   ACTIVITY LIMITATIONS: carrying, lifting, bending, standing, squatting, sleeping, stairs, transfers, bed mobility, bathing, toileting, dressing, hygiene/grooming, and locomotion level  PARTICIPATION LIMITATIONS: meal prep, cleaning, interpersonal relationship, driving, shopping, community activity, occupation, and yard work  PERSONAL FACTORS: Past/current experiences, Profession, and 3+ comorbidities: PMHx includes CKD, CVA, HTN, OSA, Obesity   are also affecting patient's functional outcome.   REHAB POTENTIAL: Fair    CLINICAL DECISION MAKING: Evolving/moderate complexity  EVALUATION COMPLEXITY: Moderate   GOALS: Goals reviewed with patient? Yes  SHORT TERM GOALS: Target date: 12/19/2022  Patient will be independent with initial home program for core mm activation, and endurance program.  Baseline: provided at eval; to be progressed over next few tx sessions Goal status: MET   LONG TERM GOALS: Target date: 02/13/2023   Patient will report improved overall functional ability with FOTO score of 49 or higher.   Baseline: 27. 01/21/23: 38% Goal status:ongoing    2.  Patient will demonstrate ability to ascend/descend stairs and inclined surfaces without pain or difficulty, using reciprocal pattern.  Goal status: ongoing   3.  Patient will report that he is able to perform all ADLs and IADLs without assistance.  Goal status: ongoing   4.  Patient will report ability to safely lifting at least 30# from floor to waist, and waist to counter height in order to ensure safe return to work activities.  Goal status: ongoing    PLAN:  PT FREQUENCY: 2x/week, decrease freq to 1x/week after first 8 weeks.   PT DURATION: 12 weeks  PLANNED INTERVENTIONS: Therapeutic exercises, Therapeutic activity, Neuromuscular re-education, Balance training, Gait training, Patient/Family education, Self Care, Stair training, Aquatic Therapy, Cryotherapy, Moist heat, Manual therapy, and Re-evaluation.  PLAN FOR NEXT SESSION: sit to supine and back to sit for ensuring proper technique . continue progress of neutral spine exercises (consider supine program)  LE endurance and pt education regarding activity modifications as appropriate   ,, PT 01/21/2023, 11:10 AM   Karie Mainland, PT 01/21/23 11:10 AM Phone: [phone] Fax: [phone]

## 2023-01-26 ENCOUNTER — Ambulatory Visit: Payer: BC Managed Care – PPO

## 2023-01-26 DIAGNOSIS — M5459 Other low back pain: Secondary | ICD-10-CM

## 2023-01-26 DIAGNOSIS — M546 Pain in thoracic spine: Secondary | ICD-10-CM | POA: Diagnosis not present

## 2023-01-26 DIAGNOSIS — R262 Difficulty in walking, not elsewhere classified: Secondary | ICD-10-CM

## 2023-01-26 DIAGNOSIS — M6281 Muscle weakness (generalized): Secondary | ICD-10-CM

## 2023-01-26 NOTE — Therapy (Signed)
Treatment Note   Patient Name: Nicholas Potts MRN: [id number] DOB:11-18-1960, 62 y.o., male Today's Date: 01/26/2023   END OF SESSION:  PT End of Session - 01/26/23 1203     Visit Number 19    Number of Visits 21    Date for PT Re-Evaluation 02/13/23    Authorization Type [employer], 30 visit limit PT/OT/ST    PT Start Time 1140    PT Stop Time 1215    PT Time Calculation (min) 35 min    Equipment Utilized During Treatment Back brace    Activity Tolerance Patient tolerated treatment well;Patient limited by pain    Behavior During Therapy WFL for tasks assessed/performed                  Past Medical History:  Diagnosis Date   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HCC) 2014   post op    ARF (acute respiratory failure) (HCC) 2014   Blood dyscrasia    Chronic kidney disease 2014   ARF    Complication of anesthesia 2014   - post op - in combination of pain pills-  Cardiac and Respiratory Arrest- prior to sleep apnea diagonosis   Constipation    CVA (cerebral infarction)    Hyperlipidemia    Hypertension    Nocturnal hypoxemia 01/15/2023   OSA (obstructive sleep apnea)    Pre-diabetes    Short-term memory loss    Sleep apnea    Stress-induced cardiomyopathy -- RESOLVED    Post-op Narcotic induced Acute Hypoxic respiratory failure with respiratory and PEA cardiac arrest following accidental narcotic overdose; initial [place] 20-20%, now improved to 55%   Was supported with Impella Cardiac Cath - non-obstructive CAD ; 1 month after event - EF up from 25% to 55-60% - stable x 5 yrs   Thrombocytopenia (HCC) 2014   Past Surgical History:  Procedure Laterality Date   ACHILLES TENDON REPAIR     BIOPSY  08/01/2019   Procedure: BIOPSY;  Surgeon: Napoleon Form, MD;  Location: [facility];  Service: Endoscopy;;   BIOPSY  08/05/2020   Procedure: BIOPSY;  Surgeon: Napoleon Form, MD;  Location: [facility];  Service: Endoscopy;;   CARDIAC CATHETERIZATION  12/29/2012   RN LHC  --> Impella. for severe shock with low EF. Mild to moderate RV pressure elevation. Normal coronary arteries. Cardiac Output 2.5, Index 1.46.   COLONOSCOPY WITH PROPOFOL N/A 10/30/2015   Procedure: COLONOSCOPY WITH PROPOFOL;  Surgeon: Napoleon Form, MD;  Location: [facility];  Service: Endoscopy;  Laterality: N/A;   COLONOSCOPY WITH PROPOFOL N/A 08/01/2019   Procedure: COLONOSCOPY WITH PROPOFOL;  Surgeon: Napoleon Form, MD;  Location: [facility];  Service: Endoscopy;  Laterality: N/A;   COLONOSCOPY WITH PROPOFOL N/A 08/05/2020   Procedure: COLONOSCOPY WITH PROPOFOL;  Surgeon: Napoleon Form, MD;  Location: [facility];  Service: Endoscopy;  Laterality: N/A;   INSERTION OF DIALYSIS CATHETER Right 01/09/2013   Procedure: INSERTION OF DIALYSIS CATHETER;  Surgeon: Chuck Hint, MD;  Location[facility];  Service: Vascular;  Laterality: Right;   LAMINECTOMY WITH POSTERIOR LATERAL ARTHRODESIS LEVEL 3 N/A 10/15/2022   Procedure: THORACIC NINE-THORACIC TWELVE POSTERIOR INSTRUMENTED FUSION REDUCTION OF FRACTURE;  Surgeon: Bedelia Person, MD;  Location: [facility] OR;  Service: Neurosurgery;  Laterality: N/A;   LEFT AND RIGHT HEART CATHETERIZATION WITH CORONARY ANGIOGRAM  12/29/2012   Procedure: LEFT AND RIGHT HEART CATHETERIZATION WITH CORONARY ANGIOGRAM;  Surgeon: Tonny Bollman, MD;  Location: [facility] CATH LAB;  Service: Cardiovascular;;  POLYPECTOMY  08/01/2019   Procedure: POLYPECTOMY;  Surgeon: Napoleon Form, MD;  Location: [facility];  Service: Endoscopy;;   POLYPECTOMY  08/05/2020   Procedure: POLYPECTOMY;  Surgeon: Napoleon Form, MD;  Location: [facility];  Service: Endoscopy;;   TRANSTHORACIC ECHOCARDIOGRAM  12/29/2012   EF 25% with periapical HK/AKA.  --> Followup echo 7/25 -- EF of 35%   TRANSTHORACIC ECHOCARDIOGRAM  01/2013 ; 07/2015   a) EF 55-60%. No regional WMA. Grade 1 diastolic dysfunction. Mild LA dilatation.; b) Normal LV size with mild LV hypertrophy. EF  55-60%. Normal RV   TRANSTHORACIC ECHOCARDIOGRAM  03/2018   EF 55-60%.  NO RWMA. Normal valves   Patient Active Problem List   Diagnosis Date Noted   Nocturnal hypoxemia 01/15/2023   Injury of thoracic spine (HCC) 10/22/2022   Dorsal (thoracic) vertebral fracture (HCC) 10/16/2022   Injury 10/15/2022   Urinary urgency 07/10/2022   Urinary frequency 07/10/2022   Morbid obesity with BMI of 40.0-44.9, adult (HCC) 09/10/2021   Fatigue 09/10/2021   History of colonic polyps    Polyp of ascending colon    Polyp of transverse colon    Polyp of cecum    Polyp of descending colon    Polyp of sigmoid colon    Hypersomnia 11/29/2017   Other headache syndrome 05/05/2017   Vertigo 05/05/2017   IFG (impaired fasting glucose) 08/21/2016   Obesity (BMI 30-39.9) 06/23/2013   Stress-induced cardiomyopathy -- essentially resolved    CVA (cerebral infarction)    OSA (obstructive sleep apnea)    Hyperlipidemia    Physical deconditioning 01/24/2013   Tinea pedis 01/18/2013   Essential hypertension 01/13/2013   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   ARF (acute renal failure) with tubular necrosis requiring HD 01/11/2013   Anemia 01/11/2013   Thrombocytopenia, unspecified (HCC) 01/11/2013   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 12/29/2012    PCP: Shade Flood, MD  REFERRING PROVIDER: Genice Rouge, MD  REFERRING DIAG: Unspecified fracture of unspecified thoracic vertebra, initial encounter for closed fracture [S22.009A], Unspecified injury at unspecified level of thoracic spinal cord, initial encounter [S24.109A]   Rationale for Evaluation and Treatment: Rehabilitation  THERAPY DIAG:  Pain in thoracic spine  Other low back pain  Muscle weakness (generalized)  Difficulty in walking, not elsewhere classified  ONSET DATE: 10/15/22  SUBJECTIVE:                                                                                                                                                                                            SUBJECTIVE STATEMENT:  Patient daughter  states "we saw the doctor last Friday. He said that he is healing, but he has 3 more months before the next follow up. He's supposed to wear the brace until then, and he still can't go back to work yet. He said that he's having that pain because his back is rigid and taking longer to heal. He still can't lift heavy, but he can lift up to 10-12 lb."   Interpretation provided by pt's daughter today per their preference; interpretation services denied.    PERTINENT HISTORY:   OP Date: 10/15/2022  2 weeks 10/29/2022  4 weeks 11/12/2022  6 weeks 11/26/2022  8 weeks 12/10/2022  10 weeks 12/24/2022  12 weeks 01/07/2023    open reduction of T10-11 fracture and posterior lateral arthrodesis of T9-03-19-11  Per note on 7/9: Patient and his daughter report that he went to see physician who updated his activity restrictions: "He can do more core strengthening, but no repetitive forward bending, sidebending, or twisting. He can lift more, but no more than 12#."   PMHx includes ARF, CKD, CVA, HTN, OSA, Obesity, Vertigo, Anemia, hx of cardiac and respiratory arrest related to unintentional narcotic overdose   PAIN:  Are you having pain? Yes: NPRS scale: 7/10 Pain location: along surgical site Pain description: sharp Aggravating factors: none identified at eval  Relieving factors: pain medication  PRECAUTIONS: Other: No lifting more than 1 gallon, no twisting, no bending forward.   WEIGHT BEARING RESTRICTIONS:  lifting restrictions as noted in precautions  FALLS:  Has patient fallen in last 6 months? No  LIVING ENVIRONMENT: Lives with: lives with their family Lives in: House/apartment Stairs: Yes: External: 5 steps; can reach both Has following equipment at home: Dan Humphreys - 2 wheeled  OCCUPATION: lifting/assembling furniture Health visitor)   PLOF:  Independent and Independent with basic ADLs  PATIENT GOALS: return to PLOF, including work   NEXT MD VISIT: 11/26/22 to cardiology, 12/03/22 to PCP   OBJECTIVE:   DIAGNOSTIC FINDINGS:  10/15/22 DG THORACOLUMBAR SPINE (post op)  FINDINGS: Pedicle screws are noted at T9, T10, T11 and T12. Posterior fixation elements are not visualized at this time.   IMPRESSION: Pedicle screw placement from T9-T12 for fixation surrounding a distraction injury at T10-T11.  PATIENT SURVEYS:  FOTO 27 current, 49 predicted  SCREENING FOR RED FLAGS: Bowel or bladder incontinence: Yes: present before accident related to CKD  Spinal tumors: No Cauda equina syndrome: No Compression fracture: No Abdominal aneurysm: No  COGNITION: Overall cognitive status: Within functional limits for tasks assessed      POSTURE: rounded shoulders, forward head, and use of TLSO limited further postural assessment  PALPATION: Not assessed at eval   LUMBAR ROM: not tested at eval d/t post op status   AROM eval  Flexion   Extension   Right lateral flexion   Left lateral flexion   Right rotation   Left rotation    (Blank rows = not tested)  Upper extremity MMT grossly: 4+/5 MMT bilaterally   LOWER EXTREMITY MMT:    MMT Right eval Left eval  Hip flexion 4+ 4+  Hip extension    Hip abduction 4+ 4+  Hip adduction 4 4  Hip internal rotation    Hip external rotation    Knee flexion 5 5  Knee extension 5 5  Ankle dorsiflexion 5 5  Ankle plantarflexion    Ankle inversion    Ankle eversion     (Blank rows = not tested)   FUNCTIONAL TESTS:  6 minute  walk test: 425 ft  GAIT: Distance walked: 425 Assistive device utilized: Environmental consultant - 2 wheeled Level of assistance: Modified independence Comments: forward flexed posture, requires cueing for shoulder depression and normal breathing   TODAY'S TREATMENT:   [facility] Adult PT Treatment:                                                DATE: 01/26/2023  Therapeutic  Exercise: Nustep UE and LE 6 min  Standing step overs with obstacles forward and sidestepping Squats x 10  Step ups 8 inch x 15 each Standing on airex, ball overhead reaching  Standing on airex, ball pass L->R red weighted ball  Standing [facility] Adult PT Treatment:                                                DATE: 01/21/23  Therapeutic Exercise: Nustep UE and LE 6 min  Standing step overs with obstacles forward and sidestepping Squats x 10  Step ups 8 inch x 15 each Seated blue band x 15 horizontal abd Ball overhead reach and anti-rotation on Airex   Self Care: FOTO, verbal review of bed mobility and rolling technique  [facility] Adult PT Treatment:                                                DATE: 01/19/23  Therapeutic Exercise: Standing in //  bars with bil support Heel raise - 30x, on airex Toe raise - 30x, on airex Taps to 5000g ball x 60", standing on airex pad SL circles on 5000g ball CW/CCW, x30 each BIL  Therapeutic Activity:  6" step ups airex on top , x 10 BIL (L without Airex, too high) Mini squats 2x10 BIL UE support standing on airex pad    [facility] Adult PT Treatment:                                                DATE: 01/14/2023  Therapeutic Exercise: Standing in //  bars with bil support Heel raise - 30x, on airex Toe raise - 30x, on airex Taps to 5000g ball 2 x 60", standing on airex pad SL circles on 5000g ball CW/CCW, x15 each BIL  Therapeutic Activity:  6" step ups, 2 x 10  6" lateral step ups, x 5 BIL  Mini squats 2x10 BIL UE support standing on airex pad  Obstacle Course: Hurdle step over (4 hurdles), with airex pad at end of hurdles, 4" step, x 2 fwd/2 lat  [facility] Adult PT Treatment:                                                DATE: 01/12/2023  Therapeutic Exercise: Standing in //  bars with bil support Heel raise - 30x Toe raise - 30x 6"  step ups, 2 x 10  6" lateral step ups, x 10 BIL  Taps to 5000g ball 2 x 60" SL circles on 5000g  ball CW/CCW, x15 each BIL    HOME EXERCISE PROGRAM: Access Code: [facility] URL: [email or web address] Date: 12/15/2022 Prepared by: Mauri Reading  Exercises - Seated Diaphragmatic Breathing  - 3 x daily - 7 x weekly - Seated Transversus Abdominis Bracing  - 5 x daily - 7 x weekly - 1 sets - 10 reps - 5 seg hold - Seated Hip Adduction Isometrics with Ball  - 5 x daily - 7 x weekly - 2 sets - 10 reps - 5 seg hold - Standing March with Counter Support  - 5 x daily - 7 x weekly - 2 sets - 5-10 reps - Standing Hip Abduction with Counter Support  - 5 x daily - 7 x weekly - 2 sets - 10 reps   ASSESSMENT:  CLINICAL IMPRESSION: Emeka continues to have limited standing tolerance d/t back pain. He has been able to tolerate new exercises without >7-8/10 NPRS. He may benefit from increased time with seated core strengthening moving forward. We will continue to progress as able. We are decreasing frequency of PT sessions to 1x/week at this time d/t longer rehab prognosis and limited insurance coverage.     OBJECTIVE IMPAIRMENTS: cardiopulmonary status limiting activity, decreased activity tolerance, decreased balance, decreased endurance, decreased mobility, difficulty walking, decreased strength, and pain.   ACTIVITY LIMITATIONS: carrying, lifting, bending, standing, squatting, sleeping, stairs, transfers, bed mobility, bathing, toileting, dressing, hygiene/grooming, and locomotion level  PARTICIPATION LIMITATIONS: meal prep, cleaning, interpersonal relationship, driving, shopping, community activity, occupation, and yard work  PERSONAL FACTORS: Past/current experiences, Profession, and 3+ comorbidities: PMHx includes CKD, CVA, HTN, OSA, Obesity   are also affecting patient's functional outcome.   REHAB POTENTIAL: Fair    CLINICAL DECISION MAKING: Evolving/moderate complexity  EVALUATION COMPLEXITY: Moderate   GOALS: Goals reviewed with patient? Yes  SHORT TERM GOALS:  Target date: 12/19/2022  Patient will be independent with initial home program for core mm activation, and endurance program.  Baseline: provided at eval; to be progressed over next few tx sessions Goal status: MET   LONG TERM GOALS: Target date: 02/13/2023   Patient will report improved overall functional ability with FOTO score of 49 or higher.   Baseline: 27. 01/21/23: 38% Goal status:ongoing   2.  Patient will demonstrate ability to ascend/descend stairs and inclined surfaces without pain or difficulty, using reciprocal pattern.  Goal status: ongoing   3.  Patient will report that he is able to perform all ADLs and IADLs without assistance.  Goal status: ongoing   4.  Patient will report ability to safely lifting at least 30# from floor to waist, and waist to counter height in order to ensure safe return to work activities.  Goal status: ongoing    PLAN:  PT FREQUENCY: 2x/week, decrease freq to 1x/week after first 8 weeks.   PT DURATION: 12 weeks  PLANNED INTERVENTIONS: Therapeutic exercises, Therapeutic activity, Neuromuscular re-education, Balance training, Gait training, Patient/Family education, Self Care, Stair training, Aquatic Therapy, Cryotherapy, Moist heat, Manual therapy, and Re-evaluation.  PLAN FOR NEXT SESSION: sit to supine and back to sit for ensuring proper technique . continue progress of neutral spine exercises (consider supine program)  LE endurance and pt education regarding activity modifications as appropriate   Mauri Reading, PT, DPT 01/26/2023, 3:24 PM

## 2023-01-27 ENCOUNTER — Ambulatory Visit: Payer: BC Managed Care – PPO | Admitting: Family Medicine

## 2023-01-28 ENCOUNTER — Ambulatory Visit: Payer: BC Managed Care – PPO | Admitting: Urology

## 2023-01-28 ENCOUNTER — Ambulatory Visit: Payer: BC Managed Care – PPO

## 2023-01-29 ENCOUNTER — Ambulatory Visit: Payer: BC Managed Care – PPO

## 2023-02-02 ENCOUNTER — Encounter: Payer: Self-pay | Admitting: Internal Medicine

## 2023-02-02 ENCOUNTER — Ambulatory Visit: Payer: BC Managed Care – PPO

## 2023-02-02 DIAGNOSIS — M6281 Muscle weakness (generalized): Secondary | ICD-10-CM

## 2023-02-02 DIAGNOSIS — M546 Pain in thoracic spine: Secondary | ICD-10-CM | POA: Diagnosis not present

## 2023-02-02 DIAGNOSIS — R262 Difficulty in walking, not elsewhere classified: Secondary | ICD-10-CM

## 2023-02-02 DIAGNOSIS — M5459 Other low back pain: Secondary | ICD-10-CM

## 2023-02-02 NOTE — Therapy (Signed)
Treatment Note   Patient Name: Nicholas Potts MRN: [id number] DOB:07/06/1960, 62 y.o., male Today's Date: 02/02/2023   END OF SESSION:  PT End of Session - 02/02/23 1133     Visit Number 20    Number of Visits 21    Date for PT Re-Evaluation 02/13/23    Authorization Type [employer], 30 visit limit PT/OT/ST    Authorization - Visit Number 5    PT Start Time 1133    PT Stop Time 1211    PT Time Calculation (min) 38 min    Equipment Utilized During Treatment Back brace    Activity Tolerance Patient tolerated treatment well;Patient limited by pain    Behavior During Therapy WFL for tasks assessed/performed                  Past Medical History:  Diagnosis Date   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HCC) 2014   post op    ARF (acute respiratory failure) (HCC) 2014   Blood dyscrasia    Chronic kidney disease 2014   ARF    Complication of anesthesia 2014   - post op - in combination of pain pills-  Cardiac and Respiratory Arrest- prior to sleep apnea diagonosis   Constipation    CVA (cerebral infarction)    Hyperlipidemia    Hypertension    Nocturnal hypoxemia 01/15/2023   OSA (obstructive sleep apnea)    Pre-diabetes    Short-term memory loss    Sleep apnea    Stress-induced cardiomyopathy -- RESOLVED    Post-op Narcotic induced Acute Hypoxic respiratory failure with respiratory and PEA cardiac arrest following accidental narcotic overdose; initial [place] 20-20%, now improved to 55%   Was supported with Impella Cardiac Cath - non-obstructive CAD ; 1 month after event - EF up from 25% to 55-60% - stable x 5 yrs   Thrombocytopenia (HCC) 2014   Past Surgical History:  Procedure Laterality Date   ACHILLES TENDON REPAIR     BIOPSY  08/01/2019   Procedure: BIOPSY;  Surgeon: Napoleon Form, MD;  Location: [facility];  Service: Endoscopy;;   BIOPSY  08/05/2020   Procedure: BIOPSY;  Surgeon: Napoleon Form, MD;  Location: [facility];  Service: Endoscopy;;   CARDIAC  CATHETERIZATION  12/29/2012   RN LHC --> Impella. for severe shock with low EF. Mild to moderate RV pressure elevation. Normal coronary arteries. Cardiac Output 2.5, Index 1.46.   COLONOSCOPY WITH PROPOFOL N/A 10/30/2015   Procedure: COLONOSCOPY WITH PROPOFOL;  Surgeon: Napoleon Form, MD;  Location: [facility];  Service: Endoscopy;  Laterality: N/A;   COLONOSCOPY WITH PROPOFOL N/A 08/01/2019   Procedure: COLONOSCOPY WITH PROPOFOL;  Surgeon: Napoleon Form, MD;  Location: [facility];  Service: Endoscopy;  Laterality: N/A;   COLONOSCOPY WITH PROPOFOL N/A 08/05/2020   Procedure: COLONOSCOPY WITH PROPOFOL;  Surgeon: Napoleon Form, MD;  Location: [facility];  Service: Endoscopy;  Laterality: N/A;   INSERTION OF DIALYSIS CATHETER Right 01/09/2013   Procedure: INSERTION OF DIALYSIS CATHETER;  Surgeon: Chuck Hint, MD;  Location: [facility] OR;  Service: Vascular;  Laterality: Right;   LAMINECTOMY WITH POSTERIOR LATERAL ARTHRODESIS LEVEL 3 N/A 10/15/2022   Procedure: THORACIC NINE-THORACIC TWELVE POSTERIOR INSTRUMENTED FUSION REDUCTION OF FRACTURE;  Surgeon: Bedelia Person, MD;  Location: [facility] OR;  Service: Neurosurgery;  Laterality: N/A;   LEFT AND RIGHT HEART CATHETERIZATION WITH CORONARY ANGIOGRAM  12/29/2012   Procedure: LEFT AND RIGHT HEART CATHETERIZATION WITH CORONARY ANGIOGRAM;  Surgeon: Tonny Bollman, MD;  Location:  [facility];  Service: Cardiovascular;;   POLYPECTOMY  08/01/2019   Procedure: POLYPECTOMY;  Surgeon: Napoleon Form, MD;  Location: [facility];  Service: Endoscopy;;   POLYPECTOMY  08/05/2020   Procedure: POLYPECTOMY;  Surgeon: Napoleon Form, MD;  Location: [facility];  Service: Endoscopy;;   TRANSTHORACIC ECHOCARDIOGRAM  12/29/2012   EF 25% with periapical HK/AKA.  --> Followup echo 7/25 -- EF of 35%   TRANSTHORACIC ECHOCARDIOGRAM  01/2013 ; 07/2015   a) EF 55-60%. No regional WMA. Grade 1 diastolic dysfunction. Mild LA dilatation.; b) Normal  LV size with mild LV hypertrophy. EF 55-60%. Normal RV   TRANSTHORACIC ECHOCARDIOGRAM  03/2018   EF 55-60%.  NO RWMA. Normal valves   Patient Active Problem List   Diagnosis Date Noted   Nocturnal hypoxemia 01/15/2023   Injury of thoracic spine (HCC) 10/22/2022   Dorsal (thoracic) vertebral fracture (HCC) 10/16/2022   Injury 10/15/2022   Urinary urgency 07/10/2022   Urinary frequency 07/10/2022   Morbid obesity with BMI of 40.0-44.9, adult (HCC) 09/10/2021   Fatigue 09/10/2021   History of colonic polyps    Polyp of ascending colon    Polyp of transverse colon    Polyp of cecum    Polyp of descending colon    Polyp of sigmoid colon    Hypersomnia 11/29/2017   Other headache syndrome 05/05/2017   Vertigo 05/05/2017   IFG (impaired fasting glucose) 08/21/2016   Obesity (BMI 30-39.9) 06/23/2013   Stress-induced cardiomyopathy -- essentially resolved    CVA (cerebral infarction)    OSA (obstructive sleep apnea)    Hyperlipidemia    Physical deconditioning 01/24/2013   Tinea pedis 01/18/2013   Essential hypertension 01/13/2013   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   ARF (acute renal failure) with tubular necrosis requiring HD 01/11/2013   Anemia 01/11/2013   Thrombocytopenia, unspecified (HCC) 01/11/2013   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 12/29/2012    PCP: Shade Flood, MD  REFERRING PROVIDER: Genice Rouge, MD  REFERRING DIAG: Unspecified fracture of unspecified thoracic vertebra, initial encounter for closed fracture [S22.009A], Unspecified injury at unspecified level of thoracic spinal cord, initial encounter [S24.109A]   Rationale for Evaluation and Treatment: Rehabilitation  THERAPY DIAG:  Pain in thoracic spine  Muscle weakness (generalized)  Difficulty in walking, not elsewhere classified  Other low back pain  ONSET DATE: 10/15/22  SUBJECTIVE:  SUBJECTIVE STATEMENT: Patient reports that they saw the doctor who recommended continuing to wear the brace.    Interpretation provided by pt's daughter today per their preference; interpretation services denied.    PERTINENT HISTORY:   OP Date: 10/15/2022  2 weeks 10/29/2022  4 weeks 11/12/2022  6 weeks 11/26/2022  8 weeks 12/10/2022  10 weeks 12/24/2022  12 weeks 01/07/2023    open reduction of T10-11 fracture and posterior lateral arthrodesis of T9-03-19-11  Per note on 7/9: Patient and his daughter report that he went to see physician who updated his activity restrictions: "He can do more core strengthening, but no repetitive forward bending, sidebending, or twisting. He can lift more, but no more than 12#."   PMHx includes ARF, CKD, CVA, HTN, OSA, Obesity, Vertigo, Anemia, hx of cardiac and respiratory arrest related to unintentional narcotic overdose   PAIN:  Are you having pain? Yes: NPRS scale: 7/10 Pain location: along surgical site Pain description: sharp Aggravating factors: none identified at eval  Relieving factors: pain medication  PRECAUTIONS: Other: No lifting more than 1 gallon, no twisting, no bending forward.   WEIGHT BEARING RESTRICTIONS:  lifting restrictions as noted in precautions  FALLS:  Has patient fallen in last 6 months? No  LIVING ENVIRONMENT: Lives with: lives with their family Lives in: House/apartment Stairs: Yes: External: 5 steps; can reach both Has following equipment at home: Dan Humphreys - 2 wheeled  OCCUPATION: lifting/assembling furniture Health visitor)   PLOF: Independent and Independent with basic ADLs  PATIENT GOALS: return to PLOF, including work   NEXT MD VISIT: 11/26/22 to cardiology, 12/03/22 to PCP   OBJECTIVE:   DIAGNOSTIC FINDINGS:  10/15/22 DG THORACOLUMBAR SPINE (post op)  FINDINGS: Pedicle  screws are noted at T9, T10, T11 and T12. Posterior fixation elements are not visualized at this time.   IMPRESSION: Pedicle screw placement from T9-T12 for fixation surrounding a distraction injury at T10-T11.  PATIENT SURVEYS:  FOTO 27 current, 49 predicted  SCREENING FOR RED FLAGS: Bowel or bladder incontinence: Yes: present before accident related to CKD  Spinal tumors: No Cauda equina syndrome: No Compression fracture: No Abdominal aneurysm: No  COGNITION: Overall cognitive status: Within functional limits for tasks assessed      POSTURE: rounded shoulders, forward head, and use of TLSO limited further postural assessment  PALPATION: Not assessed at eval   LUMBAR ROM: not tested at eval d/t post op status   AROM eval  Flexion   Extension   Right lateral flexion   Left lateral flexion   Right rotation   Left rotation    (Blank rows = not tested)  Upper extremity MMT grossly: 4+/5 MMT bilaterally   LOWER EXTREMITY MMT:    MMT Right eval Left eval  Hip flexion 4+ 4+  Hip extension    Hip abduction 4+ 4+  Hip adduction 4 4  Hip internal rotation    Hip external rotation    Knee flexion 5 5  Knee extension 5 5  Ankle dorsiflexion 5 5  Ankle plantarflexion    Ankle inversion    Ankle eversion     (Blank rows = not tested)   FUNCTIONAL TESTS:  6 minute walk test: 425 ft  GAIT: Distance walked: 425 Assistive device utilized: Walker - 2 wheeled Level of assistance: Modified independence Comments: forward flexed posture, requires cueing for shoulder depression and normal breathing   TODAY'S TREATMENT:  [facility] Adult PT Treatment:  DATE: 02/02/2023  Therapeutic Exercise: Recumbent bike level 3 x 5 mins Squats x 20 Step ups 6 inch x 10 each Standing Marches 2x30" Lifting 5# weight in BIL UE from waist to OH (middle shelf) 2x10 Therapeutic Activity: Bed mobility - cues for use of BIL UE to push and use  momentum of LE  [facility] Adult PT Treatment:                                                DATE: 01/26/2023  Therapeutic Exercise: Nustep UE and LE 6 min  Standing step overs with obstacles forward and sidestepping Squats x 10  Step ups 8 inch x 15 each Standing on airex, ball overhead reaching  Standing on airex, ball pass L->R red weighted ball  Standing [facility] Adult PT Treatment:                                                DATE: 01/21/23  Therapeutic Exercise: Nustep UE and LE 6 min  Standing step overs with obstacles forward and sidestepping Squats x 10  Step ups 8 inch x 15 each Seated blue band x 15 horizontal abd Ball overhead reach and anti-rotation on Airex   Self Care: FOTO, verbal review of bed mobility and rolling technique    HOME EXERCISE PROGRAM: Access Code: [facility] URL: [email or web address] Date: 12/15/2022 Prepared by: Mauri Reading  Exercises - Seated Diaphragmatic Breathing  - 3 x daily - 7 x weekly - Seated Transversus Abdominis Bracing  - 5 x daily - 7 x weekly - 1 sets - 10 reps - 5 seg hold - Seated Hip Adduction Isometrics with Ball  - 5 x daily - 7 x weekly - 2 sets - 10 reps - 5 seg hold - Standing March with Counter Support  - 5 x daily - 7 x weekly - 2 sets - 5-10 reps - Standing Hip Abduction with Counter Support  - 5 x daily - 7 x weekly - 2 sets - 10 reps   ASSESSMENT:  CLINICAL IMPRESSION: Patient presents to PT reporting continued back pain and compliance with his HEP. Worked on bed mobility today as patient reports increased pain when sitting up in bed, he reports decreased pain when focusing on form. Worked on exercises outside of // bars today to increase challenge. Patient was able to tolerate all prescribed exercises with no adverse effects. Patient continues to benefit from skilled PT services and should be progressed as able to improve functional independence.     OBJECTIVE IMPAIRMENTS: cardiopulmonary  status limiting activity, decreased activity tolerance, decreased balance, decreased endurance, decreased mobility, difficulty walking, decreased strength, and pain.   ACTIVITY LIMITATIONS: carrying, lifting, bending, standing, squatting, sleeping, stairs, transfers, bed mobility, bathing, toileting, dressing, hygiene/grooming, and locomotion level  PARTICIPATION LIMITATIONS: meal prep, cleaning, interpersonal relationship, driving, shopping, community activity, occupation, and yard work  PERSONAL FACTORS: Past/current experiences, Profession, and 3+ comorbidities: PMHx includes CKD, CVA, HTN, OSA, Obesity   are also affecting patient's functional outcome.   REHAB POTENTIAL: Fair    CLINICAL DECISION MAKING: Evolving/moderate complexity  EVALUATION COMPLEXITY: Moderate   GOALS: Goals reviewed with patient? Yes  SHORT TERM GOALS: Target date: 12/19/2022  Patient will  be independent with initial home program for core mm activation, and endurance program.  Baseline: provided at eval; to be progressed over next few tx sessions Goal status: MET   LONG TERM GOALS: Target date: 02/13/2023   Patient will report improved overall functional ability with FOTO score of 49 or higher.   Baseline: 27. 01/21/23: 38% Goal status:ongoing   2.  Patient will demonstrate ability to ascend/descend stairs and inclined surfaces without pain or difficulty, using reciprocal pattern.  Goal status: ongoing   3.  Patient will report that he is able to perform all ADLs and IADLs without assistance.  Goal status: ongoing   4.  Patient will report ability to safely lifting at least 30# from floor to waist, and waist to counter height in order to ensure safe return to work activities.  Goal status: ongoing    PLAN:  PT FREQUENCY: 2x/week, decrease freq to 1x/week after first 8 weeks.   PT DURATION: 12 weeks  PLANNED INTERVENTIONS: Therapeutic exercises, Therapeutic activity, Neuromuscular re-education,  Balance training, Gait training, Patient/Family education, Self Care, Stair training, Aquatic Therapy, Cryotherapy, Moist heat, Manual therapy, and Re-evaluation.  PLAN FOR NEXT SESSION: sit to supine and back to sit for ensuring proper technique . continue progress of neutral spine exercises (consider supine program)  LE endurance and pt education regarding activity modifications as appropriate   Berta Minor, PTA 02/02/2023, 12:09 PM

## 2023-02-09 ENCOUNTER — Ambulatory Visit: Payer: BC Managed Care – PPO

## 2023-02-11 ENCOUNTER — Ambulatory Visit: Payer: BC Managed Care – PPO | Attending: Physical Medicine and Rehabilitation

## 2023-02-11 DIAGNOSIS — M6281 Muscle weakness (generalized): Secondary | ICD-10-CM | POA: Diagnosis present

## 2023-02-11 DIAGNOSIS — M5459 Other low back pain: Secondary | ICD-10-CM | POA: Insufficient documentation

## 2023-02-11 DIAGNOSIS — M546 Pain in thoracic spine: Secondary | ICD-10-CM | POA: Diagnosis present

## 2023-02-11 DIAGNOSIS — R262 Difficulty in walking, not elsewhere classified: Secondary | ICD-10-CM | POA: Insufficient documentation

## 2023-02-11 NOTE — Therapy (Signed)
Treatment Note   Patient Name: Nicholas Potts MRN: [id number] DOB:Dec 19, 1960, 62 y.o., male Today's Date: 02/11/2023   END OF SESSION:  PT End of Session - 02/11/23 1402     Visit Number 21    Number of Visits 21    Authorization Type [employer], 30 visit limit PT/OT/ST    PT Start Time 1138    PT Stop Time 1217    PT Time Calculation (min) 39 min    Equipment Utilized During Treatment Back brace    Activity Tolerance Patient tolerated treatment well;Patient limited by pain    Behavior During Therapy WFL for tasks assessed/performed                   Past Medical History:  Diagnosis Date   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HCC) 2014   post op    ARF (acute respiratory failure) (HCC) 2014   Blood dyscrasia    Chronic kidney disease 2014   ARF    Complication of anesthesia 2014   - post op - in combination of pain pills-  Cardiac and Respiratory Arrest- prior to sleep apnea diagonosis   Constipation    CVA (cerebral infarction)    Hyperlipidemia    Hypertension    Nocturnal hypoxemia 01/15/2023   OSA (obstructive sleep apnea)    Pre-diabetes    Short-term memory loss    Sleep apnea    Stress-induced cardiomyopathy -- RESOLVED    Post-op Narcotic induced Acute Hypoxic respiratory failure with respiratory and PEA cardiac arrest following accidental narcotic overdose; initial [place] 20-20%, now improved to 55%   Was supported with Impella Cardiac Cath - non-obstructive CAD ; 1 month after event - EF up from 25% to 55-60% - stable x 5 yrs   Thrombocytopenia (HCC) 2014   Past Surgical History:  Procedure Laterality Date   ACHILLES TENDON REPAIR     BIOPSY  08/01/2019   Procedure: BIOPSY;  Surgeon: Napoleon Form, MD;  Location: [facility];  Service: Endoscopy;;   BIOPSY  08/05/2020   Procedure: BIOPSY;  Surgeon: Napoleon Form, MD;  Location: [facility];  Service: Endoscopy;;   CARDIAC CATHETERIZATION  12/29/2012   RN LHC --> Impella. for severe shock with low  EF. Mild to moderate RV pressure elevation. Normal coronary arteries. Cardiac Output 2.5, Index 1.46.   COLONOSCOPY WITH PROPOFOL N/A 10/30/2015   Procedure: COLONOSCOPY WITH PROPOFOL;  Surgeon: Napoleon Form, MD;  Location: [facility];  Service: Endoscopy;  Laterality: N/A;   COLONOSCOPY WITH PROPOFOL N/A 08/01/2019   Procedure: COLONOSCOPY WITH PROPOFOL;  Surgeon: Napoleon Form, MD;  Location: [facility];  Service: Endoscopy;  Laterality: N/A;   COLONOSCOPY WITH PROPOFOL N/A 08/05/2020   Procedure: COLONOSCOPY WITH PROPOFOL;  Surgeon: Napoleon Form, MD;  Location: [facility];  Service: Endoscopy;  Laterality: N/A;   INSERTION OF DIALYSIS CATHETER Right 01/09/2013   Procedure: INSERTION OF DIALYSIS CATHETER;  Surgeon: Chuck Hint, MD;  Location: [facility] OR;  Service: Vascular;  Laterality: Right;   LAMINECTOMY WITH POSTERIOR LATERAL ARTHRODESIS LEVEL 3 N/A 10/15/2022   Procedure: THORACIC NINE-THORACIC TWELVE POSTERIOR INSTRUMENTED FUSION REDUCTION OF FRACTURE;  Surgeon: Bedelia Person, MD;  Location: [facility] OR;  Service: Neurosurgery;  Laterality: N/A;   LEFT AND RIGHT HEART CATHETERIZATION WITH CORONARY ANGIOGRAM  12/29/2012   Procedure: LEFT AND RIGHT HEART CATHETERIZATION WITH CORONARY ANGIOGRAM;  Surgeon: Tonny Bollman, MD;  Location: [facility] CATH LAB;  Service: Cardiovascular;;   POLYPECTOMY  08/01/2019   Procedure: POLYPECTOMY;  Surgeon: Napoleon Form, MD;  Location: [facility];  Service: Endoscopy;;   POLYPECTOMY  08/05/2020   Procedure: POLYPECTOMY;  Surgeon: Napoleon Form, MD;  Location: [facility];  Service: Endoscopy;;   TRANSTHORACIC ECHOCARDIOGRAM  12/29/2012   EF 25% with periapical HK/AKA.  --> Followup echo 7/25 -- EF of 35%   TRANSTHORACIC ECHOCARDIOGRAM  01/2013 ; 07/2015   a) EF 55-60%. No regional WMA. Grade 1 diastolic dysfunction. Mild LA dilatation.; b) Normal LV size with mild LV hypertrophy. EF 55-60%. Normal RV   TRANSTHORACIC  ECHOCARDIOGRAM  03/2018   EF 55-60%.  NO RWMA. Normal valves   Patient Active Problem List   Diagnosis Date Noted   Nocturnal hypoxemia 01/15/2023   Injury of thoracic spine (HCC) 10/22/2022   Dorsal (thoracic) vertebral fracture (HCC) 10/16/2022   Injury 10/15/2022   Urinary urgency 07/10/2022   Urinary frequency 07/10/2022   Morbid obesity with BMI of 40.0-44.9, adult (HCC) 09/10/2021   Fatigue 09/10/2021   History of colonic polyps    Polyp of ascending colon    Polyp of transverse colon    Polyp of cecum    Polyp of descending colon    Polyp of sigmoid colon    Hypersomnia 11/29/2017   Other headache syndrome 05/05/2017   Vertigo 05/05/2017   IFG (impaired fasting glucose) 08/21/2016   Obesity (BMI 30-39.9) 06/23/2013   Stress-induced cardiomyopathy -- essentially resolved    CVA (cerebral infarction)    OSA (obstructive sleep apnea)    Hyperlipidemia    Physical deconditioning 01/24/2013   Tinea pedis 01/18/2013   Essential hypertension 01/13/2013   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   ARF (acute renal failure) with tubular necrosis requiring HD 01/11/2013   Anemia 01/11/2013   Thrombocytopenia, unspecified (HCC) 01/11/2013   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 12/29/2012    PCP: Shade Flood, MD  REFERRING PROVIDER: Genice Rouge, MD  REFERRING DIAG: Unspecified fracture of unspecified thoracic vertebra, initial encounter for closed fracture [S22.009A], Unspecified injury at unspecified level of thoracic spinal cord, initial encounter [S24.109A]   Rationale for Evaluation and Treatment: Rehabilitation  THERAPY DIAG:  Pain in thoracic spine  Muscle weakness (generalized)  Difficulty in walking, not elsewhere classified  Other low back pain  ONSET DATE: 10/15/22  SUBJECTIVE:                                                                                                                                                                                            SUBJECTIVE STATEMENT: Patient reports that he has 0-1/10 pain today. He is feeling  motivated to continue with PT.    Interpretation provided by pt's daughter today per their preference; interpretation services denied.    PERTINENT HISTORY:   OP Date: 10/15/2022  2 weeks 10/29/2022  4 weeks 11/12/2022  6 weeks 11/26/2022  8 weeks 12/10/2022  10 weeks 12/24/2022  12 weeks 01/07/2023    open reduction of T10-11 fracture and posterior lateral arthrodesis of T9-03-19-11  Per note on 7/9: Patient and his daughter report that he went to see physician who updated his activity restrictions: "He can do more core strengthening, but no repetitive forward bending, sidebending, or twisting. He can lift more, but no more than 12#."   PMHx includes ARF, CKD, CVA, HTN, OSA, Obesity, Vertigo, Anemia, hx of cardiac and respiratory arrest related to unintentional narcotic overdose   PAIN:  Are you having pain? Yes: NPRS scale: 7/10 Pain location: along surgical site Pain description: sharp Aggravating factors: none identified at eval  Relieving factors: pain medication  PRECAUTIONS: Other: No lifting more than 1 gallon, no twisting, no bending forward.   WEIGHT BEARING RESTRICTIONS:  lifting restrictions as noted in precautions  FALLS:  Has patient fallen in last 6 months? No  LIVING ENVIRONMENT: Lives with: lives with their family Lives in: House/apartment Stairs: Yes: External: 5 steps; can reach both Has following equipment at home: Dan Humphreys - 2 wheeled  OCCUPATION: lifting/assembling furniture Health visitor)   PLOF: Independent and Independent with basic ADLs  PATIENT GOALS: return to PLOF, including work   NEXT MD VISIT: 11/26/22 to cardiology, 12/03/22 to PCP   OBJECTIVE:   DIAGNOSTIC FINDINGS:  10/15/22 DG THORACOLUMBAR SPINE (post op)  FINDINGS: Pedicle screws are noted at T9, T10, T11 and T12. Posterior fixation  elements are not visualized at this time.   IMPRESSION: Pedicle screw placement from T9-T12 for fixation surrounding a distraction injury at T10-T11.  PATIENT SURVEYS:  FOTO 27 current, 49 predicted  SCREENING FOR RED FLAGS: Bowel or bladder incontinence: Yes: present before accident related to CKD  Spinal tumors: No Cauda equina syndrome: No Compression fracture: No Abdominal aneurysm: No  COGNITION: Overall cognitive status: Within functional limits for tasks assessed      POSTURE: rounded shoulders, forward head, and use of TLSO limited further postural assessment  PALPATION: Not assessed at eval   LUMBAR ROM: not tested at eval d/t post op status   AROM eval  Flexion   Extension   Right lateral flexion   Left lateral flexion   Right rotation   Left rotation    (Blank rows = not tested)  Upper extremity MMT grossly: 4+/5 MMT bilaterally   LOWER EXTREMITY MMT:    MMT Right eval Left eval  Hip flexion 4+ 4+  Hip extension    Hip abduction 4+ 4+  Hip adduction 4 4  Hip internal rotation    Hip external rotation    Knee flexion 5 5  Knee extension 5 5  Ankle dorsiflexion 5 5  Ankle plantarflexion    Ankle inversion    Ankle eversion     (Blank rows = not tested)   FUNCTIONAL TESTS:  6 minute walk test: 425 ft  GAIT: Distance walked: 425 Assistive device utilized: Walker - 2 wheeled Level of assistance: Modified independence Comments: forward flexed posture, requires cueing for shoulder depression and normal breathing   TODAY'S TREATMENT:    [facility] Adult PT Treatment:  DATE: 02/11/2023  Therapeutic Exercise: Recumbent bike level 3 x 5 mins // bar marching 2.5# ankle weight x 2  Squats x 20 Step ups 6 inch x 10 each Standing Marches 2x30" Standing 3000g ball waist to OH at // bar, 2 x 10  Lifting 7# dumbbell in BIL UE from waist to OH (middle shelf) 2x10 Lateral walking with GTB x 20 Pallof press  GTB x 15 each  Hurdles (4) x 3 laps lateral Standing hip extension with 2.5# ankle weight x 20 each   Therapeutic Activity:  Reassessment of objective measures and subjective assessment regarding progress towards established goals and plan for remaining PT sessions.    [facility] Adult PT Treatment:                                                DATE: 02/02/2023  Therapeutic Exercise: Recumbent bike level 3 x 5 mins Squats x 20 Step ups 6 inch x 10 each Standing Marches 2x30" Lifting 5# weight in BIL UE from waist to OH (middle shelf) 2x10  Therapeutic Activity: Bed mobility - cues for use of BIL UE to push and use momentum of LE   [facility] Adult PT Treatment:                                                DATE: 01/26/2023  Therapeutic Exercise: Nustep UE and LE 6 min  Standing step overs with obstacles forward and sidestepping Squats x 10  Step ups 8 inch x 15 each Standing on airex, ball overhead reaching  Standing on airex, ball pass L->R red weighted ball  Standing Marches    HOME EXERCISE PROGRAM: Access Code: [facility] URL: [email or web address] Date: 12/15/2022 Prepared by: Mauri Reading  Exercises - Seated Diaphragmatic Breathing  - 3 x daily - 7 x weekly - Seated Transversus Abdominis Bracing  - 5 x daily - 7 x weekly - 1 sets - 10 reps - 5 seg hold - Seated Hip Adduction Isometrics with Ball  - 5 x daily - 7 x weekly - 2 sets - 10 reps - 5 seg hold - Standing March with Counter Support  - 5 x daily - 7 x weekly - 2 sets - 5-10 reps - Standing Hip Abduction with Counter Support  - 5 x daily - 7 x weekly - 2 sets - 10 reps   ASSESSMENT:  CLINICAL IMPRESSION: Juwaan has attended 21 total PT visits at this time and plans to continue skilled PT 1x/week for 3 more weeks in order to further progress towards PLOF. He tolerated today's session very well, beginning with 0-1/10 pain and ending with 4/10 pain at end of session. We continued with recent progression  towards neutral core strengthening activities outside of //bars and increased LE strengthening activities.    OBJECTIVE IMPAIRMENTS: cardiopulmonary status limiting activity, decreased activity tolerance, decreased balance, decreased endurance, decreased mobility, difficulty walking, decreased strength, and pain.   ACTIVITY LIMITATIONS: carrying, lifting, bending, standing, squatting, sleeping, stairs, transfers, bed mobility, bathing, toileting, dressing, hygiene/grooming, and locomotion level  PARTICIPATION LIMITATIONS: meal prep, cleaning, interpersonal relationship, driving, shopping, community activity, occupation, and yard work  PERSONAL FACTORS: Past/current experiences, Profession, and 3+ comorbidities: PMHx includes CKD, CVA, HTN, OSA,  Obesity   are also affecting patient's functional outcome.   REHAB POTENTIAL: Fair    CLINICAL DECISION MAKING: Evolving/moderate complexity  EVALUATION COMPLEXITY: Moderate   GOALS: Goals reviewed with patient? Yes  SHORT TERM GOALS: Target date: 12/19/2022  Patient will be independent with initial home program for core mm activation, and endurance program.  Baseline: provided at eval; to be progressed over next few tx sessions Goal status: MET   LONG TERM GOALS: Target date: 02/13/2023, extended to 03/06/23    Patient will report improved overall functional ability with FOTO score of 49 or higher.   Baseline: 27 01/21/23: 38% Goal status:ongoing   2.  Patient will demonstrate ability to ascend/descend stairs and inclined surfaces without pain or difficulty, using reciprocal pattern.  Goal status: ongoing   3.  Patient will report that he is able to perform all ADLs and IADLs without assistance.   02/11/23: Mod I; can stand 10-15 minutes for meal prep  Goal status: Partially Met  4.  Patient will report ability to safely lifting at least 30# from floor to waist, and waist to counter height in order to ensure safe return to work activities.   Goal status: ongoing    PLAN:  PT FREQUENCY: 1x/week   PT DURATION: 3 additional weeks as of 02/11/23  PLANNED INTERVENTIONS: Therapeutic exercises, Therapeutic activity, Neuromuscular re-education, Balance training, Gait training, Patient/Family education, Self Care, Stair training, Aquatic Therapy, Cryotherapy, Moist heat, Manual therapy, and Re-evaluation.  PLAN FOR NEXT SESSION: sit to supine and back to sit for ensuring proper technique . continue progress of neutral spine exercises, LE endurance and pt education regarding activity modifications as appropriate, update HEP in prep for d/c    Mauri Reading, PT, DPT 02/11/2023, 2:22 PM

## 2023-02-18 ENCOUNTER — Ambulatory Visit: Payer: BC Managed Care – PPO

## 2023-02-25 ENCOUNTER — Ambulatory Visit: Payer: BC Managed Care – PPO

## 2023-02-25 DIAGNOSIS — M6281 Muscle weakness (generalized): Secondary | ICD-10-CM

## 2023-02-25 DIAGNOSIS — R262 Difficulty in walking, not elsewhere classified: Secondary | ICD-10-CM

## 2023-02-25 DIAGNOSIS — M5459 Other low back pain: Secondary | ICD-10-CM

## 2023-02-25 DIAGNOSIS — M546 Pain in thoracic spine: Secondary | ICD-10-CM

## 2023-02-25 NOTE — Therapy (Signed)
Treatment Note   Patient Name: Nicholas Potts MRN: [id number] DOB:1961-04-30, 62 y.o., male Today's Date: 02/25/2023   END OF SESSION:  PT End of Session - 02/25/23 1314     Visit Number 22    Number of Visits 24    Date for PT Re-Evaluation 03/06/23    Authorization Type [employer], 30 visit limit PT/OT/ST    PT Start Time 1230    PT Stop Time 1258    PT Time Calculation (min) 28 min    Equipment Utilized During Treatment Back brace    Activity Tolerance Patient tolerated treatment well;Patient limited by pain    Behavior During Therapy WFL for tasks assessed/performed                    Past Medical History:  Diagnosis Date   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HCC) 2014   post op    ARF (acute respiratory failure) (HCC) 2014   Blood dyscrasia    Chronic kidney disease 2014   ARF    Complication of anesthesia 2014   - post op - in combination of pain pills-  Cardiac and Respiratory Arrest- prior to sleep apnea diagonosis   Constipation    CVA (cerebral infarction)    Hyperlipidemia    Hypertension    Nocturnal hypoxemia 01/15/2023   OSA (obstructive sleep apnea)    Pre-diabetes    Short-term memory loss    Sleep apnea    Stress-induced cardiomyopathy -- RESOLVED    Post-op Narcotic induced Acute Hypoxic respiratory failure with respiratory and PEA cardiac arrest following accidental narcotic overdose; initial [place] 20-20%, now improved to 55%   Was supported with Impella Cardiac Cath - non-obstructive CAD ; 1 month after event - EF up from 25% to 55-60% - stable x 5 yrs   Thrombocytopenia (HCC) 2014   Past Surgical History:  Procedure Laterality Date   ACHILLES TENDON REPAIR     BIOPSY  08/01/2019   Procedure: BIOPSY;  Surgeon: Napoleon Form, MD;  Location: [facility];  Service: Endoscopy;;   BIOPSY  08/05/2020   Procedure: BIOPSY;  Surgeon: Napoleon Form, MD;  Location: [facility];  Service: Endoscopy;;   CARDIAC CATHETERIZATION  12/29/2012   RN  LHC --> Impella. for severe shock with low EF. Mild to moderate RV pressure elevation. Normal coronary arteries. Cardiac Output 2.5, Index 1.46.   COLONOSCOPY WITH PROPOFOL N/A 10/30/2015   Procedure: COLONOSCOPY WITH PROPOFOL;  Surgeon: Napoleon Form, MD;  Location: [facility];  Service: Endoscopy;  Laterality: N/A;   COLONOSCOPY WITH PROPOFOL N/A 08/01/2019   Procedure: COLONOSCOPY WITH PROPOFOL;  Surgeon: Napoleon Form, MD;  Location: [facility];  Service: Endoscopy;  Laterality: N/A;   COLONOSCOPY WITH PROPOFOL N/A 08/05/2020   Procedure: COLONOSCOPY WITH PROPOFOL;  Surgeon: Napoleon Form, MD;  Location: [facility];  Service: Endoscopy;  Laterality: N/A;   INSERTION OF DIALYSIS CATHETER Right 01/09/2013   Procedure: INSERTION OF DIALYSIS CATHETER;  Surgeon: Chuck Hint, MD;  Location: [facility] OR;  Service: Vascular;  Laterality: Right;   LAMINECTOMY WITH POSTERIOR LATERAL ARTHRODESIS LEVEL 3 N/A 10/15/2022   Procedure: THORACIC NINE-THORACIC TWELVE POSTERIOR INSTRUMENTED FUSION REDUCTION OF FRACTURE;  Surgeon: Bedelia Person, MD;  Location: [facility] OR;  Service: Neurosurgery;  Laterality: N/A;   LEFT AND RIGHT HEART CATHETERIZATION WITH CORONARY ANGIOGRAM  12/29/2012   Procedure: LEFT AND RIGHT HEART CATHETERIZATION WITH CORONARY ANGIOGRAM;  Surgeon: Tonny Bollman, MD;  Location: [facility] CATH LAB;  Service: Cardiovascular;;  POLYPECTOMY  08/01/2019   Procedure: POLYPECTOMY;  Surgeon: Napoleon Form, MD;  Location: [facility];  Service: Endoscopy;;   POLYPECTOMY  08/05/2020   Procedure: POLYPECTOMY;  Surgeon: Napoleon Form, MD;  Location: [facility];  Service: Endoscopy;;   TRANSTHORACIC ECHOCARDIOGRAM  12/29/2012   EF 25% with periapical HK/AKA.  --> Followup echo 7/25 -- EF of 35%   TRANSTHORACIC ECHOCARDIOGRAM  01/2013 ; 07/2015   a) EF 55-60%. No regional WMA. Grade 1 diastolic dysfunction. Mild LA dilatation.; b) Normal LV size with mild LV hypertrophy.  EF 55-60%. Normal RV   TRANSTHORACIC ECHOCARDIOGRAM  03/2018   EF 55-60%.  NO RWMA. Normal valves   Patient Active Problem List   Diagnosis Date Noted   Nocturnal hypoxemia 01/15/2023   Injury of thoracic spine (HCC) 10/22/2022   Dorsal (thoracic) vertebral fracture (HCC) 10/16/2022   Injury 10/15/2022   Urinary urgency 07/10/2022   Urinary frequency 07/10/2022   Morbid obesity with BMI of 40.0-44.9, adult (HCC) 09/10/2021   Fatigue 09/10/2021   History of colonic polyps    Polyp of ascending colon    Polyp of transverse colon    Polyp of cecum    Polyp of descending colon    Polyp of sigmoid colon    Hypersomnia 11/29/2017   Other headache syndrome 05/05/2017   Vertigo 05/05/2017   IFG (impaired fasting glucose) 08/21/2016   Obesity (BMI 30-39.9) 06/23/2013   Stress-induced cardiomyopathy -- essentially resolved    CVA (cerebral infarction)    OSA (obstructive sleep apnea)    Hyperlipidemia    Physical deconditioning 01/24/2013   Tinea pedis 01/18/2013   Essential hypertension 01/13/2013   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   ARF (acute renal failure) with tubular necrosis requiring HD 01/11/2013   Anemia 01/11/2013   Thrombocytopenia, unspecified (HCC) 01/11/2013   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 12/29/2012    PCP: Shade Flood, MD  REFERRING PROVIDER: Genice Rouge, MD  REFERRING DIAG: Unspecified fracture of unspecified thoracic vertebra, initial encounter for closed fracture [S22.009A], Unspecified injury at unspecified level of thoracic spinal cord, initial encounter [S24.109A]   Rationale for Evaluation and Treatment: Rehabilitation  THERAPY DIAG:  Pain in thoracic spine  Muscle weakness (generalized)  Difficulty in walking, not elsewhere classified  Other low back pain  ONSET DATE: 10/15/22  SUBJECTIVE:                                                                                                                                                                                            SUBJECTIVE STATEMENT: Patient reporting to  PT with 5/10 pain. He has been having 5-6/10 pain since last visit.   Interpretation provided by pt's daughter today per their preference; interpretation services denied.    PERTINENT HISTORY:   OP Date: 10/15/2022  2 weeks 10/29/2022  4 weeks 11/12/2022  6 weeks 11/26/2022  8 weeks 12/10/2022  10 weeks 12/24/2022  12 weeks 01/07/2023    open reduction of T10-11 fracture and posterior lateral arthrodesis of T9-03-19-11  Per note on 7/9: Patient and his daughter report that he went to see physician who updated his activity restrictions: "He can do more core strengthening, but no repetitive forward bending, sidebending, or twisting. He can lift more, but no more than 12#."   PMHx includes ARF, CKD, CVA, HTN, OSA, Obesity, Vertigo, Anemia, hx of cardiac and respiratory arrest related to unintentional narcotic overdose   PAIN:  Are you having pain? Yes: NPRS scale: 7/10 Pain location: along surgical site Pain description: sharp Aggravating factors: none identified at eval  Relieving factors: pain medication  PRECAUTIONS: Other: No lifting more than 1 gallon, no twisting, no bending forward.   WEIGHT BEARING RESTRICTIONS:  lifting restrictions as noted in precautions  FALLS:  Has patient fallen in last 6 months? No  LIVING ENVIRONMENT: Lives with: lives with their family Lives in: House/apartment Stairs: Yes: External: 5 steps; can reach both Has following equipment at home: Dan Humphreys - 2 wheeled  OCCUPATION: lifting/assembling furniture Health visitor)   PLOF: Independent and Independent with basic ADLs  PATIENT GOALS: return to PLOF, including work   NEXT MD VISIT: 11/26/22 to cardiology, 12/03/22 to PCP   OBJECTIVE:   DIAGNOSTIC FINDINGS:  10/15/22 DG THORACOLUMBAR SPINE (post op)  FINDINGS: Pedicle screws are noted at T9, T10, T11  and T12. Posterior fixation elements are not visualized at this time.   IMPRESSION: Pedicle screw placement from T9-T12 for fixation surrounding a distraction injury at T10-T11.  PATIENT SURVEYS:  FOTO 27 current, 49 predicted  SCREENING FOR RED FLAGS: Bowel or bladder incontinence: Yes: present before accident related to CKD  Spinal tumors: No Cauda equina syndrome: No Compression fracture: No Abdominal aneurysm: No  COGNITION: Overall cognitive status: Within functional limits for tasks assessed      POSTURE: rounded shoulders, forward head, and use of TLSO limited further postural assessment  PALPATION: Not assessed at eval   LUMBAR ROM: not tested at eval d/t post op status   AROM eval  Flexion   Extension   Right lateral flexion   Left lateral flexion   Right rotation   Left rotation    (Blank rows = not tested)  Upper extremity MMT grossly: 4+/5 MMT bilaterally   LOWER EXTREMITY MMT:    MMT Right eval Left eval  Hip flexion 4+ 4+  Hip extension    Hip abduction 4+ 4+  Hip adduction 4 4  Hip internal rotation    Hip external rotation    Knee flexion 5 5  Knee extension 5 5  Ankle dorsiflexion 5 5  Ankle plantarflexion    Ankle inversion    Ankle eversion     (Blank rows = not tested)   FUNCTIONAL TESTS:  6 minute walk test: 425 ft  GAIT: Distance walked: 425 Assistive device utilized: Walker - 2 wheeled Level of assistance: Modified independence Comments: forward flexed posture, requires cueing for shoulder depression and normal breathing   TODAY'S TREATMENT:   [facility] Adult PT Treatment:  DATE: 02/25/2023  Therapeutic Exercise: Squats x 15 Step ups 6 inch x 10 each Lifting 7# dumbbell in BIL UE from waist to OH (middle shelf) 2x10 Isometric walk outs 10# cc x 10 each Pallof press 7# cc x 25 each  Hurdles (4) x 2 laps lateral, 2 laps forward  Standing hip extension with 2.5# ankle weight x 20  each    [facility] Adult PT Treatment:                                                DATE: 02/11/2023  Therapeutic Exercise: Recumbent bike level 3 x 5 mins // bar marching 2.5# ankle weight x 2  Squats x 20 Step ups 6 inch x 10 each Standing Marches 2x30" Standing 3000g ball waist to OH at // bar, 2 x 10  Lifting 7# dumbbell in BIL UE from waist to OH (middle shelf) 2x10 Lateral walking with GTB x 20 Pallof press GTB x 15 each  Hurdles (4) x 3 laps lateral Standing hip extension with 2.5# ankle weight x 20 each   Therapeutic Activity:  Reassessment of objective measures and subjective assessment regarding progress towards established goals and plan for remaining PT sessions.    [facility] Adult PT Treatment:                                                DATE: 02/02/2023  Therapeutic Exercise: Recumbent bike level 3 x 5 mins Squats x 20 Step ups 6 inch x 10 each Standing Marches 2x30" Lifting 5# weight in BIL UE from waist to OH (middle shelf) 2x10  Therapeutic Activity: Bed mobility - cues for use of BIL UE to push and use momentum of LE   [facility] Adult PT Treatment:                                                DATE: 01/26/2023  Therapeutic Exercise: Nustep UE and LE 6 min  Standing step overs with obstacles forward and sidestepping Squats x 10  Step ups 8 inch x 15 each Standing on airex, ball overhead reaching  Standing on airex, ball pass L->R red weighted ball  Standing Marches    HOME EXERCISE PROGRAM: Access Code: [facility] URL: [email or web address] Date: 12/15/2022 Prepared by: Mauri Reading  Exercises - Seated Diaphragmatic Breathing  - 3 x daily - 7 x weekly - Seated Transversus Abdominis Bracing  - 5 x daily - 7 x weekly - 1 sets - 10 reps - 5 seg hold - Seated Hip Adduction Isometrics with Ball  - 5 x daily - 7 x weekly - 2 sets - 10 reps - 5 seg hold - Standing March with Counter Support  - 5 x daily - 7 x weekly - 2 sets - 5-10 reps - Standing  Hip Abduction with Counter Support  - 5 x daily - 7 x weekly - 2 sets - 10 reps   ASSESSMENT:  CLINICAL IMPRESSION:  Nicholas Potts arrived about 15 minutes late for today's session and had abbreviated treatment. However, he was able to tolerate some progression of  exercises with one instance of LOB while performing step up with airex pad. Plan is to updated home program at next visit and discuss possible hold from PT.    OBJECTIVE IMPAIRMENTS: cardiopulmonary status limiting activity, decreased activity tolerance, decreased balance, decreased endurance, decreased mobility, difficulty walking, decreased strength, and pain.   ACTIVITY LIMITATIONS: carrying, lifting, bending, standing, squatting, sleeping, stairs, transfers, bed mobility, bathing, toileting, dressing, hygiene/grooming, and locomotion level  PARTICIPATION LIMITATIONS: meal prep, cleaning, interpersonal relationship, driving, shopping, community activity, occupation, and yard work  PERSONAL FACTORS: Past/current experiences, Profession, and 3+ comorbidities: PMHx includes CKD, CVA, HTN, OSA, Obesity   are also affecting patient's functional outcome.   REHAB POTENTIAL: Fair    CLINICAL DECISION MAKING: Evolving/moderate complexity  EVALUATION COMPLEXITY: Moderate   GOALS: Goals reviewed with patient? Yes  SHORT TERM GOALS: Target date: 12/19/2022  Patient will be independent with initial home program for core mm activation, and endurance program.  Baseline: provided at eval; to be progressed over next few tx sessions Goal status: MET   LONG TERM GOALS: Target date: 02/13/2023, extended to 03/06/23    Patient will report improved overall functional ability with FOTO score of 49 or higher.   Baseline: 27 01/21/23: 38% Goal status:ongoing   2.  Patient will demonstrate ability to ascend/descend stairs and inclined surfaces without pain or difficulty, using reciprocal pattern.  Goal status: ongoing   3.  Patient will report  that he is able to perform all ADLs and IADLs without assistance.   02/11/23: Mod I; can stand 10-15 minutes for meal prep  Goal status: Partially Met  4.  Patient will report ability to safely lifting at least 30# from floor to waist, and waist to counter height in order to ensure safe return to work activities.  Goal status: ongoing    PLAN:  PT FREQUENCY: 1x/week   PT DURATION: 3 additional weeks as of 02/11/23  PLANNED INTERVENTIONS: Therapeutic exercises, Therapeutic activity, Neuromuscular re-education, Balance training, Gait training, Patient/Family education, Self Care, Stair training, Aquatic Therapy, Cryotherapy, Moist heat, Manual therapy, and Re-evaluation.  PLAN FOR NEXT SESSION: sit to supine and back to sit for ensuring proper technique . continue progress of neutral spine exercises, LE endurance and pt education regarding activity modifications as appropriate, update HEP in prep for d/c    Mauri Reading, PT, DPT 02/25/2023, 1:29 PM

## 2023-03-04 ENCOUNTER — Ambulatory Visit: Payer: BC Managed Care – PPO

## 2023-03-04 DIAGNOSIS — M546 Pain in thoracic spine: Secondary | ICD-10-CM

## 2023-03-04 DIAGNOSIS — R262 Difficulty in walking, not elsewhere classified: Secondary | ICD-10-CM

## 2023-03-04 DIAGNOSIS — M6281 Muscle weakness (generalized): Secondary | ICD-10-CM

## 2023-03-04 DIAGNOSIS — M5459 Other low back pain: Secondary | ICD-10-CM

## 2023-03-04 NOTE — Therapy (Signed)
Treatment Note   Patient Name: Nicholas Potts MRN: [id number] DOB:1960-10-29, 62 y.o., male Today's Date: 03/04/2023   END OF SESSION:  PT End of Session - 03/04/23 1232     Visit Number 23                     Past Medical History:  Diagnosis Date   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HCC) 2014   post op    ARF (acute respiratory failure) (HCC) 2014   Blood dyscrasia    Chronic kidney disease 2014   ARF    Complication of anesthesia 2014   - post op - in combination of pain pills-  Cardiac and Respiratory Arrest- prior to sleep apnea diagonosis   Constipation    CVA (cerebral infarction)    Hyperlipidemia    Hypertension    Nocturnal hypoxemia 01/15/2023   OSA (obstructive sleep apnea)    Pre-diabetes    Short-term memory loss    Sleep apnea    Stress-induced cardiomyopathy -- RESOLVED    Post-op Narcotic induced Acute Hypoxic respiratory failure with respiratory and PEA cardiac arrest following accidental narcotic overdose; initial [place] 20-20%, now improved to 55%   Was supported with Impella Cardiac Cath - non-obstructive CAD ; 1 month after event - EF up from 25% to 55-60% - stable x 5 yrs   Thrombocytopenia (HCC) 2014   Past Surgical History:  Procedure Laterality Date   ACHILLES TENDON REPAIR     BIOPSY  08/01/2019   Procedure: BIOPSY;  Surgeon: Napoleon Form, MD;  Location: [facility];  Service: Endoscopy;;   BIOPSY  08/05/2020   Procedure: BIOPSY;  Surgeon: Napoleon Form, MD;  Location: [facility];  Service: Endoscopy;;   CARDIAC CATHETERIZATION  12/29/2012   RN LHC --> Impella. for severe shock with low EF. Mild to moderate RV pressure elevation. Normal coronary arteries. Cardiac Output 2.5, Index 1.46.   COLONOSCOPY WITH PROPOFOL N/A 10/30/2015   Procedure: COLONOSCOPY WITH PROPOFOL;  Surgeon: Napoleon Form, MD;  Location: [facility];  Service: Endoscopy;  Laterality: N/A;   COLONOSCOPY WITH PROPOFOL N/A 08/01/2019   Procedure:  COLONOSCOPY WITH PROPOFOL;  Surgeon: Napoleon Form, MD;  Location: [facility];  Service: Endoscopy;  Laterality: N/A;   COLONOSCOPY WITH PROPOFOL N/A 08/05/2020   Procedure: COLONOSCOPY WITH PROPOFOL;  Surgeon: Napoleon Form, MD;  Location: [facility];  Service: Endoscopy;  Laterality: N/A;   INSERTION OF DIALYSIS CATHETER Right 01/09/2013   Procedure: INSERTION OF DIALYSIS CATHETER;  Surgeon: Chuck Hint, MD;  Location: [facility] OR;  Service: Vascular;  Laterality: Right;   LAMINECTOMY WITH POSTERIOR LATERAL ARTHRODESIS LEVEL 3 N/A 10/15/2022   Procedure: THORACIC NINE-THORACIC TWELVE POSTERIOR INSTRUMENTED FUSION REDUCTION OF FRACTURE;  Surgeon: Bedelia Person, MD;  Location: [facility] OR;  Service: Neurosurgery;  Laterality: N/A;   LEFT AND RIGHT HEART CATHETERIZATION WITH CORONARY ANGIOGRAM  12/29/2012   Procedure: LEFT AND RIGHT HEART CATHETERIZATION WITH CORONARY ANGIOGRAM;  Surgeon: Tonny Bollman, MD;  Location: [facility] CATH LAB;  Service: Cardiovascular;;   POLYPECTOMY  08/01/2019   Procedure: POLYPECTOMY;  Surgeon: Napoleon Form, MD;  Location: [facility];  Service: Endoscopy;;   POLYPECTOMY  08/05/2020   Procedure: POLYPECTOMY;  Surgeon: Napoleon Form, MD;  Location: [facility];  Service: Endoscopy;;   TRANSTHORACIC ECHOCARDIOGRAM  12/29/2012   EF 25% with periapical HK/AKA.  --> Followup echo 7/25 -- EF of 35%   TRANSTHORACIC ECHOCARDIOGRAM  01/2013 ; 07/2015   a) EF  55-60%. No regional WMA. Grade 1 diastolic dysfunction. Mild LA dilatation.; b) Normal LV size with mild LV hypertrophy. EF 55-60%. Normal RV   TRANSTHORACIC ECHOCARDIOGRAM  03/2018   EF 55-60%.  NO RWMA. Normal valves   Patient Active Problem List   Diagnosis Date Noted   Nocturnal hypoxemia 01/15/2023   Injury of thoracic spine (HCC) 10/22/2022   Dorsal (thoracic) vertebral fracture (HCC) 10/16/2022   Injury 10/15/2022   Urinary urgency 07/10/2022   Urinary frequency 07/10/2022   Morbid  obesity with BMI of 40.0-44.9, adult (HCC) 09/10/2021   Fatigue 09/10/2021   History of colonic polyps    Polyp of ascending colon    Polyp of transverse colon    Polyp of cecum    Polyp of descending colon    Polyp of sigmoid colon    Hypersomnia 11/29/2017   Other headache syndrome 05/05/2017   Vertigo 05/05/2017   IFG (impaired fasting glucose) 08/21/2016   Obesity (BMI 30-39.9) 06/23/2013   Stress-induced cardiomyopathy -- essentially resolved    CVA (cerebral infarction)    OSA (obstructive sleep apnea)    Hyperlipidemia    Physical deconditioning 01/24/2013   Tinea pedis 01/18/2013   Essential hypertension 01/13/2013   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   ARF (acute renal failure) with tubular necrosis requiring HD 01/11/2013   Anemia 01/11/2013   Thrombocytopenia, unspecified (HCC) 01/11/2013   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 12/29/2012    PCP: Shade Flood, MD  REFERRING PROVIDER: Genice Rouge, MD  REFERRING DIAG: Unspecified fracture of unspecified thoracic vertebra, initial encounter for closed fracture [S22.009A], Unspecified injury at unspecified level of thoracic spinal cord, initial encounter [S24.109A]   Rationale for Evaluation and Treatment: Rehabilitation  THERAPY DIAG:  Pain in thoracic spine  Muscle weakness (generalized)  Other low back pain  Difficulty in walking, not elsewhere classified  ONSET DATE: 10/15/22  SUBJECTIVE:                                                                                                                                                                                           SUBJECTIVE STATEMENT: Patient is reporting that his pain as been consistently 5-6/10 lately. He has been ambulated at home with RW and recently stopped wearing the brace around his home. However, his daughter states that the doctor wants him to wear the brace all the time  until the fracture heals. They would like to continue with skilled PT care.   Interpretation provided by pt's daughter today per their preference; interpretation services denied.    PERTINENT HISTORY:   OP  Date: 10/15/2022  2 weeks 10/29/2022  4 weeks 11/12/2022  6 weeks 11/26/2022  8 weeks 12/10/2022  10 weeks 12/24/2022  12 weeks 01/07/2023    open reduction of T10-11 fracture and posterior lateral arthrodesis of T9-03-19-11  Per note on 7/9: Patient and his daughter report that he went to see physician who updated his activity restrictions: "He can do more core strengthening, but no repetitive forward bending, sidebending, or twisting. He can lift more, but no more than 12#."   PMHx includes ARF, CKD, CVA, HTN, OSA, Obesity, Vertigo, Anemia, hx of cardiac and respiratory arrest related to unintentional narcotic overdose   PAIN:  Are you having pain? Yes: NPRS scale: 7/10 Pain location: along surgical site Pain description: sharp Aggravating factors: none identified at eval  Relieving factors: pain medication  PRECAUTIONS: Other: No lifting more than 1 gallon, no twisting, no bending forward.   WEIGHT BEARING RESTRICTIONS:  lifting restrictions as noted in precautions  FALLS:  Has patient fallen in last 6 months? No  LIVING ENVIRONMENT: Lives with: lives with their family Lives in: House/apartment Stairs: Yes: External: 5 steps; can reach both Has following equipment at home: Dan Humphreys - 2 wheeled  OCCUPATION: lifting/assembling furniture Health visitor)   PLOF: Independent and Independent with basic ADLs  PATIENT GOALS: return to PLOF, including work   NEXT MD VISIT: 11/26/22 to cardiology, 12/03/22 to PCP   OBJECTIVE:   DIAGNOSTIC FINDINGS:  10/15/22 DG THORACOLUMBAR SPINE (post op)  FINDINGS: Pedicle screws are noted at T9, T10, T11 and T12. Posterior fixation elements are not visualized at this time.   IMPRESSION: Pedicle screw placement from T9-T12 for fixation  surrounding a distraction injury at T10-T11.  PATIENT SURVEYS:  FOTO 27 current, 49 predicted  SCREENING FOR RED FLAGS: Bowel or bladder incontinence: Yes: present before accident related to CKD  Spinal tumors: No Cauda equina syndrome: No Compression fracture: No Abdominal aneurysm: No  COGNITION: Overall cognitive status: Within functional limits for tasks assessed      POSTURE: rounded shoulders, forward head, and use of TLSO limited further postural assessment  PALPATION: Not assessed at eval   LUMBAR ROM: not tested at eval d/t post op status   AROM eval  Flexion   Extension   Right lateral flexion   Left lateral flexion   Right rotation   Left rotation    (Blank rows = not tested)  Upper extremity MMT grossly: 4+/5 MMT bilaterally   LOWER EXTREMITY MMT:    MMT Right eval Left eval  Hip flexion 4+ 4+  Hip extension    Hip abduction 4+ 4+  Hip adduction 4 4  Hip internal rotation    Hip external rotation    Knee flexion 5 5  Knee extension 5 5  Ankle dorsiflexion 5 5  Ankle plantarflexion    Ankle inversion    Ankle eversion     (Blank rows = not tested)   FUNCTIONAL TESTS:  6 minute walk test: 425 ft  GAIT: Distance walked: 425 Assistive device utilized: Walker - 2 wheeled Level of assistance: Modified independence Comments: forward flexed posture, requires cueing for shoulder depression and normal breathing   TODAY'S TREATMENT:    [facility] Adult PT Treatment:                                                DATE: 03/04/2023  Therapeutic Exercise: Squats x 15 Step ups 6 inch x 10 each Pallof press gtb x 15 each  Hurdles (4) x 2 laps lateral, 2 laps forward  Standing hip extension with 3# ankle weight x 15 each   Therapeutic Activity:  Reassessment of objective measures and subjective assessment regarding progress towards established goals and plan for updated/extended POC to further address remaining deficits, monitoring symptoms and  promoting safe activity while spinal fracture heals Gait training with [facility]  [facility] Adult PT Treatment:                                                DATE: 02/25/2023  Therapeutic Exercise: Squats x 15 Step ups 6 inch x 10 each Lifting 7# dumbbell in BIL UE from waist to OH (middle shelf) 2x10 Isometric walk outs 10# cc x 10 each Pallof press 7# cc x 25 each  Hurdles (4) x 2 laps lateral, 2 laps forward  Standing hip extension with 2.5# ankle weight x 20 each    [facility] Adult PT Treatment:                                                DATE: 02/11/2023  Therapeutic Exercise: Recumbent bike level 3 x 5 mins // bar marching 2.5# ankle weight x 2  Squats x 20 Step ups 6 inch x 10 each Standing Marches 2x30" Standing 3000g ball waist to OH at // bar, 2 x 10  Lifting 7# dumbbell in BIL UE from waist to OH (middle shelf) 2x10 Lateral walking with GTB x 20 Pallof press GTB x 15 each  Hurdles (4) x 3 laps lateral Standing hip extension with 2.5# ankle weight x 20 each   Therapeutic Activity:  Reassessment of objective measures and subjective assessment regarding progress towards established goals and plan for remaining PT sessions.    HOME EXERCISE PROGRAM: Access Code: [facility] URL: [email or web address] Date: 12/15/2022 Prepared by: Mauri Reading  Exercises - Seated Diaphragmatic Breathing  - 3 x daily - 7 x weekly - Seated Transversus Abdominis Bracing  - 5 x daily - 7 x weekly - 1 sets - 10 reps - 5 seg hold - Seated Hip Adduction Isometrics with Ball  - 5 x daily - 7 x weekly - 2 sets - 10 reps - 5 seg hold - Standing March with Counter Support  - 5 x daily - 7 x weekly - 2 sets - 5-10 reps - Standing Hip Abduction with Counter Support  - 5 x daily - 7 x weekly - 2 sets - 10 reps   ASSESSMENT:  CLINICAL IMPRESSION:  Saahas has attended 23 total PT sessions at this time for low back pain related to spinal fracture and related distraction injury to the lower  thoracic spine.  He is demonstrating improved pain severity as well as activity tolerance.  He demonstrates good understanding of safe activities at this time.  Clinician spent time today reminding patient of indications for and importance of TLSO brace.  We also spent time gait training with SPC in order to maximize safe and independent function at this time with LRAD.  He continues to be limited by current restrictions as related to healing status, and will continue  to benefit from physical therapy once a week for gradual progression towards established rehab goals as appropriate.  Plan is to continue for 8 weeks, patient is in agreement with this plan.   OBJECTIVE IMPAIRMENTS: cardiopulmonary status limiting activity, decreased activity tolerance, decreased balance, decreased endurance, decreased mobility, difficulty walking, decreased strength, and pain.   ACTIVITY LIMITATIONS: carrying, lifting, bending, standing, squatting, sleeping, stairs, transfers, bed mobility, bathing, toileting, dressing, hygiene/grooming, and locomotion level  PARTICIPATION LIMITATIONS: meal prep, cleaning, interpersonal relationship, driving, shopping, community activity, occupation, and yard work  PERSONAL FACTORS: Past/current experiences, Profession, and 3+ comorbidities: PMHx includes CKD, CVA, HTN, OSA, Obesity   are also affecting patient's functional outcome.   REHAB POTENTIAL: Fair    CLINICAL DECISION MAKING: Evolving/moderate complexity  EVALUATION COMPLEXITY: Moderate   GOALS: Goals reviewed with patient? Yes  SHORT TERM GOALS: Target date: 12/19/2022  Patient will be independent with initial home program for core mm activation, and endurance program.  Baseline: provided at eval; to be progressed over next few tx sessions Goal status: MET   LONG TERM GOALS: Target date: 04/29/23, last updated 03/04/23   Patient will report improved overall functional ability with FOTO score of 49 or higher.    Baseline: 27 01/21/23: 38% 03/04/23: 39% Goal status:ongoing   2.  Patient will demonstrate ability to ascend/descend stairs and inclined surfaces without pain or difficulty, using reciprocal pattern.  Goal status: ongoing; utilizing modified technique and B UE support as of 03/04/23  3.  Patient will report that he is able to perform all ADLs and IADLs without assistance.   02/11/23: Mod I; can stand 10-15 minutes for meal prep   03/04/23: Mod I; can stand 15-20 minutes, would like to use SPC more  Goal status: PROGRESSING  4.  Patient will report ability to safely lift at least 30# from floor to waist, and waist to counter height in order to ensure safe return to work activities.  Goal status: ongoing; limited by current fracture restrictions as of 03/04/23  PLAN:  PT FREQUENCY: 1x/week   PT DURATION: 8 additional weeks as of 03/04/23  PLANNED INTERVENTIONS: Therapeutic exercises, Therapeutic activity, Neuromuscular re-education, Balance training, Gait training, Patient/Family education, Self Care, Stair training, Aquatic Therapy, Cryotherapy, Moist heat, Manual therapy, and Re-evaluation.  PLAN FOR NEXT SESSION: HEP updates as appropriate, core strengthening progress as appropriate, UE and LE strengthening   Mauri Reading, PT, DPT 03/04/2023, 12:35 PM

## 2023-03-11 ENCOUNTER — Ambulatory Visit: Payer: BC Managed Care – PPO | Attending: Physical Medicine and Rehabilitation

## 2023-03-11 DIAGNOSIS — M5459 Other low back pain: Secondary | ICD-10-CM | POA: Insufficient documentation

## 2023-03-11 DIAGNOSIS — S22078A Other fracture of T9-T10 vertebra, initial encounter for closed fracture: Secondary | ICD-10-CM | POA: Diagnosis present

## 2023-03-11 DIAGNOSIS — R262 Difficulty in walking, not elsewhere classified: Secondary | ICD-10-CM | POA: Insufficient documentation

## 2023-03-11 DIAGNOSIS — M546 Pain in thoracic spine: Secondary | ICD-10-CM | POA: Insufficient documentation

## 2023-03-11 DIAGNOSIS — M6281 Muscle weakness (generalized): Secondary | ICD-10-CM | POA: Insufficient documentation

## 2023-03-11 NOTE — Therapy (Signed)
Treatment Note   Patient Name: Nicholas Potts MRN: [id number] DOB:02/11/1961, 62 y.o., male Today's Date: 03/11/2023   END OF SESSION:  PT End of Session - 03/11/23 1217     Visit Number 24    Number of Visits 31    Date for PT Re-Evaluation 04/29/23    Authorization Type [employer], 30 visit limit PT/OT/ST    PT Start Time 1215    PT Stop Time 1255    PT Time Calculation (min) 40 min    Equipment Utilized During Treatment Back brace    Activity Tolerance Patient tolerated treatment well;Patient limited by pain    Behavior During Therapy WFL for tasks assessed/performed                      Past Medical History:  Diagnosis Date   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HCC) 2014   post op    ARF (acute respiratory failure) (HCC) 2014   Blood dyscrasia    Chronic kidney disease 2014   ARF    Complication of anesthesia 2014   - post op - in combination of pain pills-  Cardiac and Respiratory Arrest- prior to sleep apnea diagonosis   Constipation    CVA (cerebral infarction)    Hyperlipidemia    Hypertension    Nocturnal hypoxemia 01/15/2023   OSA (obstructive sleep apnea)    Pre-diabetes    Short-term memory loss    Sleep apnea    Stress-induced cardiomyopathy -- RESOLVED    Post-op Narcotic induced Acute Hypoxic respiratory failure with respiratory and PEA cardiac arrest following accidental narcotic overdose; initial [place] 20-20%, now improved to 55%   Was supported with Impella Cardiac Cath - non-obstructive CAD ; 1 month after event - EF up from 25% to 55-60% - stable x 5 yrs   Thrombocytopenia (HCC) 2014   Past Surgical History:  Procedure Laterality Date   ACHILLES TENDON REPAIR     BIOPSY  08/01/2019   Procedure: BIOPSY;  Surgeon: Napoleon Form, MD;  Location: [facility];  Service: Endoscopy;;   BIOPSY  08/05/2020   Procedure: BIOPSY;  Surgeon: Napoleon Form, MD;  Location: [facility];  Service: Endoscopy;;   CARDIAC CATHETERIZATION  12/29/2012    RN LHC --> Impella. for severe shock with low EF. Mild to moderate RV pressure elevation. Normal coronary arteries. Cardiac Output 2.5, Index 1.46.   COLONOSCOPY WITH PROPOFOL N/A 10/30/2015   Procedure: COLONOSCOPY WITH PROPOFOL;  Surgeon: Napoleon Form, MD;  Location: [facility];  Service: Endoscopy;  Laterality: N/A;   COLONOSCOPY WITH PROPOFOL N/A 08/01/2019   Procedure: COLONOSCOPY WITH PROPOFOL;  Surgeon: Napoleon Form, MD;  Location: [facility];  Service: Endoscopy;  Laterality: N/A;   COLONOSCOPY WITH PROPOFOL N/A 08/05/2020   Procedure: COLONOSCOPY WITH PROPOFOL;  Surgeon: Napoleon Form, MD;  Location: [facility];  Service: Endoscopy;  Laterality: N/A;   INSERTION OF DIALYSIS CATHETER Right 01/09/2013   Procedure: INSERTION OF DIALYSIS CATHETER;  Surgeon: Chuck Hint, MD;  Location: [facility] OR;  Service: Vascular;  Laterality: Right;   LAMINECTOMY WITH POSTERIOR LATERAL ARTHRODESIS LEVEL 3 N/A 10/15/2022   Procedure: THORACIC NINE-THORACIC TWELVE POSTERIOR INSTRUMENTED FUSION REDUCTION OF FRACTURE;  Surgeon: Bedelia Person, MD;  Location: [facility] OR;  Service: Neurosurgery;  Laterality: N/A;   LEFT AND RIGHT HEART CATHETERIZATION WITH CORONARY ANGIOGRAM  12/29/2012   Procedure: LEFT AND RIGHT HEART CATHETERIZATION WITH CORONARY ANGIOGRAM;  Surgeon: Tonny Bollman, MD;  Location: [facility] CATH [facility];  Service: Cardiovascular;;   POLYPECTOMY  08/01/2019   Procedure: POLYPECTOMY;  Surgeon: Napoleon Form, MD;  Location: [facility];  Service: Endoscopy;;   POLYPECTOMY  08/05/2020   Procedure: POLYPECTOMY;  Surgeon: Napoleon Form, MD;  Location: [facility];  Service: Endoscopy;;   TRANSTHORACIC ECHOCARDIOGRAM  12/29/2012   EF 25% with periapical HK/AKA.  --> Followup echo 7/25 -- EF of 35%   TRANSTHORACIC ECHOCARDIOGRAM  01/2013 ; 07/2015   a) EF 55-60%. No regional WMA. Grade 1 diastolic dysfunction. Mild LA dilatation.; b) Normal LV size with mild LV  hypertrophy. EF 55-60%. Normal RV   TRANSTHORACIC ECHOCARDIOGRAM  03/2018   EF 55-60%.  NO RWMA. Normal valves   Patient Active Problem List   Diagnosis Date Noted   Nocturnal hypoxemia 01/15/2023   Injury of thoracic spine (HCC) 10/22/2022   Dorsal (thoracic) vertebral fracture (HCC) 10/16/2022   Injury 10/15/2022   Urinary urgency 07/10/2022   Urinary frequency 07/10/2022   Morbid obesity with BMI of 40.0-44.9, adult (HCC) 09/10/2021   Fatigue 09/10/2021   History of colonic polyps    Polyp of ascending colon    Polyp of transverse colon    Polyp of cecum    Polyp of descending colon    Polyp of sigmoid colon    Hypersomnia 11/29/2017   Other headache syndrome 05/05/2017   Vertigo 05/05/2017   IFG (impaired fasting glucose) 08/21/2016   Obesity (BMI 30-39.9) 06/23/2013   Stress-induced cardiomyopathy -- essentially resolved    Cerebral infarction (HCC)    OSA (obstructive sleep apnea)    Hyperlipidemia    Physical deconditioning 01/24/2013   Tinea pedis 01/18/2013   Essential hypertension 01/13/2013   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   ARF (acute renal failure) with tubular necrosis requiring HD 01/11/2013   Anemia 01/11/2013   Thrombocytopenia, unspecified (HCC) 01/11/2013   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 12/29/2012    PCP: Shade Flood, MD  REFERRING PROVIDER: Genice Rouge, MD  REFERRING DIAG: Unspecified fracture of unspecified thoracic vertebra, initial encounter for closed fracture [S22.009A], Unspecified injury at unspecified level of thoracic spinal cord, initial encounter [S24.109A]   Rationale for Evaluation and Treatment: Rehabilitation  THERAPY DIAG:  Pain in thoracic spine  Muscle weakness (generalized)  Other low back pain  Difficulty in walking, not elsewhere classified  Other closed fracture of ninth thoracic vertebra, initial encounter (HCC)  ONSET DATE:  10/15/22  SUBJECTIVE:  SUBJECTIVE STATEMENT: Patient reports that his pain levels remain around 6/10.   Interpretation provided by pt's daughter today per their preference; interpretation services denied.    PERTINENT HISTORY:   OP Date: 10/15/2022  2 weeks 10/29/2022  4 weeks 11/12/2022  6 weeks 11/26/2022  8 weeks 12/10/2022  10 weeks 12/24/2022  12 weeks 01/07/2023    open reduction of T10-11 fracture and posterior lateral arthrodesis of T9-03-19-11  Per note on 7/9: Patient and his daughter report that he went to see physician who updated his activity restrictions: "He can do more core strengthening, but no repetitive forward bending, sidebending, or twisting. He can lift more, but no more than 12#."   PMHx includes ARF, CKD, CVA, HTN, OSA, Obesity, Vertigo, Anemia, hx of cardiac and respiratory arrest related to unintentional narcotic overdose   PAIN:  Are you having pain? Yes: NPRS scale: 7/10 Pain location: along surgical site Pain description: sharp Aggravating factors: none identified at eval  Relieving factors: pain medication  PRECAUTIONS: Other: No lifting more than 1 gallon, no twisting, no bending forward.   WEIGHT BEARING RESTRICTIONS:  lifting restrictions as noted in precautions  FALLS:  Has patient fallen in last 6 months? No  LIVING ENVIRONMENT: Lives with: lives with their family Lives in: House/apartment Stairs: Yes: External: 5 steps; can reach both Has following equipment at home: Dan Humphreys - 2 wheeled  OCCUPATION: lifting/assembling furniture Health visitor)   PLOF: Independent and Independent with basic ADLs  PATIENT GOALS: return to PLOF, including work   NEXT MD VISIT: 11/26/22 to cardiology, 12/03/22 to PCP   OBJECTIVE:   DIAGNOSTIC FINDINGS:  10/15/22 DG THORACOLUMBAR  SPINE (post op)  FINDINGS: Pedicle screws are noted at T9, T10, T11 and T12. Posterior fixation elements are not visualized at this time.   IMPRESSION: Pedicle screw placement from T9-T12 for fixation surrounding a distraction injury at T10-T11.  PATIENT SURVEYS:  FOTO 27 current, 49 predicted  SCREENING FOR RED FLAGS: Bowel or bladder incontinence: Yes: present before accident related to CKD  Spinal tumors: No Cauda equina syndrome: No Compression fracture: No Abdominal aneurysm: No  COGNITION: Overall cognitive status: Within functional limits for tasks assessed      POSTURE: rounded shoulders, forward head, and use of TLSO limited further postural assessment  PALPATION: Not assessed at eval   LUMBAR ROM: not tested at eval d/t post op status   AROM eval  Flexion   Extension   Right lateral flexion   Left lateral flexion   Right rotation   Left rotation    (Blank rows = not tested)  Upper extremity MMT grossly: 4+/5 MMT bilaterally   LOWER EXTREMITY MMT:    MMT Right eval Left eval  Hip flexion 4+ 4+  Hip extension    Hip abduction 4+ 4+  Hip adduction 4 4  Hip internal rotation    Hip external rotation    Knee flexion 5 5  Knee extension 5 5  Ankle dorsiflexion 5 5  Ankle plantarflexion    Ankle inversion    Ankle eversion     (Blank rows = not tested)   FUNCTIONAL TESTS:  6 minute walk test: 425 ft  GAIT: Distance walked: 425 Assistive device utilized: Walker - 2 wheeled Level of assistance: Modified independence Comments: forward flexed posture, requires cueing for shoulder depression and normal breathing   TODAY'S TREATMENT:  [facility] Adult PT Treatment:  DATE: 03/11/23 Therapeutic Exercise: Squats with UE support 2x10 Step ups 6 inch x 10 each Pallof press gtb standing on airex x10 each  Standing hip extension with 2.5# ankle weight 2 x 15 each  Standing hip abduction 2.5# x10 BIL Lifting 7#  dumbbell in BIL UE from waist to OH (middle shelf) 2x10 Chops 7# x10, 10# x10 BIL Standing on Airex rows/shoulder extension GTB 2x10 Therapeutic Activity: Gait training with SPC x 1 lap - cues for form and pacing (pain in back)   [facility] Adult PT Treatment:                                                DATE: 03/04/2023  Therapeutic Exercise: Squats x 15 Step ups 6 inch x 10 each Pallof press gtb x 15 each  Hurdles (4) x 2 laps lateral, 2 laps forward  Standing hip extension with 3# ankle weight x 15 each   Therapeutic Activity:  Reassessment of objective measures and subjective assessment regarding progress towards established goals and plan for updated/extended POC to further address remaining deficits, monitoring symptoms and promoting safe activity while spinal fracture heals Gait training with [facility]  [facility] Adult PT Treatment:                                                DATE: 02/25/2023  Therapeutic Exercise: Squats x 15 Step ups 6 inch x 10 each Lifting 7# dumbbell in BIL UE from waist to OH (middle shelf) 2x10 Isometric walk outs 10# cc x 10 each Pallof press 7# cc x 25 each  Hurdles (4) x 2 laps lateral, 2 laps forward  Standing hip extension with 2.5# ankle weight x 20 each    HOME EXERCISE PROGRAM: Access Code: [facility] URL: [email or web address] Date: 12/15/2022 Prepared by: Mauri Reading  Exercises - Seated Diaphragmatic Breathing  - 3 x daily - 7 x weekly - Seated Transversus Abdominis Bracing  - 5 x daily - 7 x weekly - 1 sets - 10 reps - 5 seg hold - Seated Hip Adduction Isometrics with Ball  - 5 x daily - 7 x weekly - 2 sets - 10 reps - 5 seg hold - Standing March with Counter Support  - 5 x daily - 7 x weekly - 2 sets - 5-10 reps - Standing Hip Abduction with Counter Support  - 5 x daily - 7 x weekly - 2 sets - 10 reps   ASSESSMENT:  CLINICAL IMPRESSION: Patient presents to PT reporting continued back pain and that he needs his HEP reprinted.  Session today continued to focus on LE strengthening and challenging his proprioception. [facility] training for 1 lap today with cues for form, he does endorse 8/10 at conclusion. Patient continues to benefit from skilled PT services and should be progressed as able to improve functional independence.     [employer] has attended 23 total PT sessions at this time for low back pain related to spinal fracture and related distraction injury to the lower thoracic spine.  He is demonstrating improved pain severity as well as activity tolerance.  He demonstrates good understanding of safe activities at this time.  Clinician spent time today reminding patient of indications for and importance of  TLSO brace.  We also spent time gait training with SPC in order to maximize safe and independent function at this time with LRAD.  He continues to be limited by current restrictions as related to healing status, and will continue to benefit from physical therapy once a week for gradual progression towards established rehab goals as appropriate.  Plan is to continue for 8 weeks, patient is in agreement with this plan.   OBJECTIVE IMPAIRMENTS: cardiopulmonary status limiting activity, decreased activity tolerance, decreased balance, decreased endurance, decreased mobility, difficulty walking, decreased strength, and pain.   ACTIVITY LIMITATIONS: carrying, lifting, bending, standing, squatting, sleeping, stairs, transfers, bed mobility, bathing, toileting, dressing, hygiene/grooming, and locomotion level  PARTICIPATION LIMITATIONS: meal prep, cleaning, interpersonal relationship, driving, shopping, community activity, occupation, and yard work  PERSONAL FACTORS: Past/current experiences, Profession, and 3+ comorbidities: PMHx includes CKD, CVA, HTN, OSA, Obesity   are also affecting patient's functional outcome.   REHAB POTENTIAL: Fair    CLINICAL DECISION MAKING: Evolving/moderate complexity  EVALUATION COMPLEXITY:  Moderate   GOALS: Goals reviewed with patient? Yes  SHORT TERM GOALS: Target date: 12/19/2022  Patient will be independent with initial home program for core mm activation, and endurance program.  Baseline: provided at eval; to be progressed over next few tx sessions Goal status: MET   LONG TERM GOALS: Target date: 04/29/23, last updated 03/04/23   Patient will report improved overall functional ability with FOTO score of 49 or higher.   Baseline: 27 01/21/23: 38% 03/04/23: 39% Goal status:ongoing   2.  Patient will demonstrate ability to ascend/descend stairs and inclined surfaces without pain or difficulty, using reciprocal pattern.  Goal status: ongoing; utilizing modified technique and B UE support as of 03/04/23  3.  Patient will report that he is able to perform all ADLs and IADLs without assistance.   02/11/23: Mod I; can stand 10-15 minutes for meal prep   03/04/23: Mod I; can stand 15-20 minutes, would like to use SPC more  Goal status: PROGRESSING  4.  Patient will report ability to safely lift at least 30# from floor to waist, and waist to counter height in order to ensure safe return to work activities.  Goal status: ongoing; limited by current fracture restrictions as of 03/04/23  PLAN:  PT FREQUENCY: 1x/week   PT DURATION: 8 additional weeks as of 03/04/23  PLANNED INTERVENTIONS: Therapeutic exercises, Therapeutic activity, Neuromuscular re-education, Balance training, Gait training, Patient/Family education, Self Care, Stair training, Aquatic Therapy, Cryotherapy, Moist heat, Manual therapy, and Re-evaluation.  PLAN FOR NEXT SESSION: HEP updates as appropriate, core strengthening progress as appropriate, UE and LE strengthening   Berta Minor, PTA 03/11/2023, 12:54 PM

## 2023-03-18 ENCOUNTER — Ambulatory Visit: Payer: BC Managed Care – PPO

## 2023-03-18 DIAGNOSIS — R262 Difficulty in walking, not elsewhere classified: Secondary | ICD-10-CM

## 2023-03-18 DIAGNOSIS — M5459 Other low back pain: Secondary | ICD-10-CM

## 2023-03-18 DIAGNOSIS — M546 Pain in thoracic spine: Secondary | ICD-10-CM | POA: Diagnosis not present

## 2023-03-18 DIAGNOSIS — M6281 Muscle weakness (generalized): Secondary | ICD-10-CM

## 2023-03-18 NOTE — Therapy (Signed)
Treatment Note   Patient Name: Nicholas Potts MRN: [id number] DOB:02-07-1961, 62 y.o., male Today's Date: 03/18/2023   END OF SESSION:  PT End of Session - 03/18/23 1048     Visit Number 25    Number of Visits 31    Date for PT Re-Evaluation 04/29/23    Authorization Type [employer], 30 visit limit PT/OT/ST    PT Start Time 1047    PT Stop Time 1125    PT Time Calculation (min) 38 min    Equipment Utilized During Treatment Back brace    Activity Tolerance Patient tolerated treatment well;Patient limited by pain    Behavior During Therapy WFL for tasks assessed/performed                       Past Medical History:  Diagnosis Date   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HCC) 2014   post op    ARF (acute respiratory failure) (HCC) 2014   Blood dyscrasia    Chronic kidney disease 2014   ARF    Complication of anesthesia 2014   - post op - in combination of pain pills-  Cardiac and Respiratory Arrest- prior to sleep apnea diagonosis   Constipation    CVA (cerebral infarction)    Hyperlipidemia    Hypertension    Nocturnal hypoxemia 01/15/2023   OSA (obstructive sleep apnea)    Pre-diabetes    Short-term memory loss    Sleep apnea    Stress-induced cardiomyopathy -- RESOLVED    Post-op Narcotic induced Acute Hypoxic respiratory failure with respiratory and PEA cardiac arrest following accidental narcotic overdose; initial [place] 20-20%, now improved to 55%   Was supported with Impella Cardiac Cath - non-obstructive CAD ; 1 month after event - EF up from 25% to 55-60% - stable x 5 yrs   Thrombocytopenia (HCC) 2014   Past Surgical History:  Procedure Laterality Date   ACHILLES TENDON REPAIR     BIOPSY  08/01/2019   Procedure: BIOPSY;  Surgeon: Napoleon Form, MD;  Location: [facility];  Service: Endoscopy;;   BIOPSY  08/05/2020   Procedure: BIOPSY;  Surgeon: Napoleon Form, MD;  Location: [facility];  Service: Endoscopy;;   CARDIAC CATHETERIZATION  12/29/2012    RN LHC --> Impella. for severe shock with low EF. Mild to moderate RV pressure elevation. Normal coronary arteries. Cardiac Output 2.5, Index 1.46.   COLONOSCOPY WITH PROPOFOL N/A 10/30/2015   Procedure: COLONOSCOPY WITH PROPOFOL;  Surgeon: Napoleon Form, MD;  Location: [facility];  Service: Endoscopy;  Laterality: N/A;   COLONOSCOPY WITH PROPOFOL N/A 08/01/2019   Procedure: COLONOSCOPY WITH PROPOFOL;  Surgeon: Napoleon Form, MD;  Location: [facility];  Service: Endoscopy;  Laterality: N/A;   COLONOSCOPY WITH PROPOFOL N/A 08/05/2020   Procedure: COLONOSCOPY WITH PROPOFOL;  Surgeon: Napoleon Form, MD;  Location: [facility];  Service: Endoscopy;  Laterality: N/A;   INSERTION OF DIALYSIS CATHETER Right 01/09/2013   Procedure: INSERTION OF DIALYSIS CATHETER;  Surgeon: Chuck Hint, MD;  Location: [facility];  Service: Vascular;  Laterality: Right;   LAMINECTOMY WITH POSTERIOR LATERAL ARTHRODESIS LEVEL 3 N/A 10/15/2022   Procedure: THORACIC NINE-THORACIC TWELVE POSTERIOR INSTRUMENTED FUSION REDUCTION OF FRACTURE;  Surgeon: Bedelia Person, MD;  Location: [facility] OR;  Service: Neurosurgery;  Laterality: N/A;   LEFT AND RIGHT HEART CATHETERIZATION WITH CORONARY ANGIOGRAM  12/29/2012   Procedure: LEFT AND RIGHT HEART CATHETERIZATION WITH CORONARY ANGIOGRAM;  Surgeon: Tonny Bollman, MD;  Location: [facility] CATH LAB;  Service: Cardiovascular;;   POLYPECTOMY  08/01/2019   Procedure: POLYPECTOMY;  Surgeon: Napoleon Form, MD;  Location: [facility];  Service: Endoscopy;;   POLYPECTOMY  08/05/2020   Procedure: POLYPECTOMY;  Surgeon: Napoleon Form, MD;  Location: [facility];  Service: Endoscopy;;   TRANSTHORACIC ECHOCARDIOGRAM  12/29/2012   EF 25% with periapical HK/AKA.  --> Followup echo 7/25 -- EF of 35%   TRANSTHORACIC ECHOCARDIOGRAM  01/2013 ; 07/2015   a) EF 55-60%. No regional WMA. Grade 1 diastolic dysfunction. Mild LA dilatation.; b) Normal LV size with mild LV  hypertrophy. EF 55-60%. Normal RV   TRANSTHORACIC ECHOCARDIOGRAM  03/2018   EF 55-60%.  NO RWMA. Normal valves   Patient Active Problem List   Diagnosis Date Noted   Nocturnal hypoxemia 01/15/2023   Injury of thoracic spine (HCC) 10/22/2022   Dorsal (thoracic) vertebral fracture (HCC) 10/16/2022   Injury 10/15/2022   Urinary urgency 07/10/2022   Urinary frequency 07/10/2022   Morbid obesity with BMI of 40.0-44.9, adult (HCC) 09/10/2021   Fatigue 09/10/2021   History of colonic polyps    Polyp of ascending colon    Polyp of transverse colon    Polyp of cecum    Polyp of descending colon    Polyp of sigmoid colon    Hypersomnia 11/29/2017   Other headache syndrome 05/05/2017   Vertigo 05/05/2017   IFG (impaired fasting glucose) 08/21/2016   Obesity (BMI 30-39.9) 06/23/2013   Stress-induced cardiomyopathy -- essentially resolved    Cerebral infarction (HCC)    OSA (obstructive sleep apnea)    Hyperlipidemia    Physical deconditioning 01/24/2013   Tinea pedis 01/18/2013   Essential hypertension 01/13/2013   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   ARF (acute renal failure) with tubular necrosis requiring HD 01/11/2013   Anemia 01/11/2013   Thrombocytopenia, unspecified (HCC) 01/11/2013   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 12/29/2012    PCP: Shade Flood, MD  REFERRING PROVIDER: Genice Rouge, MD  REFERRING DIAG: Unspecified fracture of unspecified thoracic vertebra, initial encounter for closed fracture [S22.009A], Unspecified injury at unspecified level of thoracic spinal cord, initial encounter [S24.109A]   Rationale for Evaluation and Treatment: Rehabilitation  THERAPY DIAG:  Pain in thoracic spine  Muscle weakness (generalized)  Other low back pain  Difficulty in walking, not elsewhere classified  ONSET DATE: 10/15/22  SUBJECTIVE:                                                                                                                                                                                            SUBJECTIVE  STATEMENT:  Patient states that he is feeling a bit tired today d/t not sleeping well.   Interpretation provided by pt's daughter today per their preference; interpretation services denied.    PERTINENT HISTORY:   OP Date: 10/15/2022  2 weeks 10/29/2022  4 weeks 11/12/2022  6 weeks 11/26/2022  8 weeks 12/10/2022  10 weeks 12/24/2022  12 weeks 01/07/2023    open reduction of T10-11 fracture and posterior lateral arthrodesis of T9-03-19-11  Per note on 7/9: Patient and his daughter report that he went to see physician who updated his activity restrictions: "He can do more core strengthening, but no repetitive forward bending, sidebending, or twisting. He can lift more, but no more than 12#."   PMHx includes ARF, CKD, CVA, HTN, OSA, Obesity, Vertigo, Anemia, hx of cardiac and respiratory arrest related to unintentional narcotic overdose   PAIN:  Are you having pain? Yes: NPRS scale: 7/10 Pain location: along surgical site Pain description: sharp Aggravating factors: none identified at eval  Relieving factors: pain medication  PRECAUTIONS: Other: No lifting more than 1 gallon, no twisting, no bending forward.   WEIGHT BEARING RESTRICTIONS:  lifting restrictions as noted in precautions  FALLS:  Has patient fallen in last 6 months? No  LIVING ENVIRONMENT: Lives with: lives with their family Lives in: House/apartment Stairs: Yes: External: 5 steps; can reach both Has following equipment at home: Dan Humphreys - 2 wheeled  OCCUPATION: lifting/assembling furniture Health visitor)   PLOF: Independent and Independent with basic ADLs  PATIENT GOALS: return to PLOF, including work   NEXT MD VISIT: 11/26/22 to cardiology, 12/03/22 to PCP   OBJECTIVE:   DIAGNOSTIC FINDINGS:  10/15/22 DG THORACOLUMBAR SPINE (post op)  FINDINGS: Pedicle screws are noted at T9, T10, T11  and T12. Posterior fixation elements are not visualized at this time.   IMPRESSION: Pedicle screw placement from T9-T12 for fixation surrounding a distraction injury at T10-T11.  PATIENT SURVEYS:  FOTO 27 current, 49 predicted  SCREENING FOR RED FLAGS: Bowel or bladder incontinence: Yes: present before accident related to CKD  Spinal tumors: No Cauda equina syndrome: No Compression fracture: No Abdominal aneurysm: No  COGNITION: Overall cognitive status: Within functional limits for tasks assessed      POSTURE: rounded shoulders, forward head, and use of TLSO limited further postural assessment  PALPATION: Not assessed at eval   LUMBAR ROM: not tested at eval d/t post op status   AROM eval  Flexion   Extension   Right lateral flexion   Left lateral flexion   Right rotation   Left rotation    (Blank rows = not tested)  Upper extremity MMT grossly: 4+/5 MMT bilaterally   LOWER EXTREMITY MMT:    MMT Right eval Left eval  Hip flexion 4+ 4+  Hip extension    Hip abduction 4+ 4+  Hip adduction 4 4  Hip internal rotation    Hip external rotation    Knee flexion 5 5  Knee extension 5 5  Ankle dorsiflexion 5 5  Ankle plantarflexion    Ankle inversion    Ankle eversion     (Blank rows = not tested)   FUNCTIONAL TESTS:  6 minute walk test: 425 ft  GAIT: Distance walked: 425 Assistive device utilized: Walker - 2 wheeled Level of assistance: Modified independence Comments: forward flexed posture, requires cueing for shoulder depression and normal breathing   TODAY'S TREATMENT:   [facility] Adult PT Treatment:  DATE: 03/18/2023  Therapeutic Exercise: Step Ups from airex to 8" step fwd, x 10 each LE Step Ups from airex to 8" step lateral x 15 each LE  Squats with UE support 2x10 on airex  Pallof press GTB standing on airex x10 each  Standing hip extension with 2.5# ankle weight x 15 each  Standing hip abduction 2.5#  x15 BIL Chops blue TB x 20 BIL  Therapeutic Activity: Gait training with SPC x 1 lap (185 ft) - cues for form and pacing (pain in back)   [facility] Adult PT Treatment:                                                DATE: 03/11/23 Therapeutic Exercise: Squats with UE support 2x10 Step ups 6 inch x 10 each Pallof press gtb standing on airex x10 each  Standing hip extension with 2.5# ankle weight 2 x 15 each  Standing hip abduction 2.5# x10 BIL Lifting 7# dumbbell in BIL UE from waist to OH (middle shelf) 2x10 Chops 7# x10, 10# x10 BIL Standing on Airex rows/shoulder extension GTB 2x10 Therapeutic Activity: Gait training with SPC x 1 lap - cues for form and pacing (pain in back)   [facility] Adult PT Treatment:                                                DATE: 03/04/2023  Therapeutic Exercise: Squats x 15 Step ups 6 inch x 10 each Pallof press gtb x 15 each  Hurdles (4) x 2 laps lateral, 2 laps forward  Standing hip extension with 3# ankle weight x 15 each   Therapeutic Activity:  Reassessment of objective measures and subjective assessment regarding progress towards established goals and plan for updated/extended POC to further address remaining deficits, monitoring symptoms and promoting safe activity while spinal fracture heals Gait training with [facility]  [facility] Adult PT Treatment:                                                DATE: 02/25/2023  Therapeutic Exercise: Squats x 15 Step ups 6 inch x 10 each Lifting 7# dumbbell in BIL UE from waist to OH (middle shelf) 2x10 Isometric walk outs 10# cc x 10 each Pallof press 7# cc x 25 each  Hurdles (4) x 2 laps lateral, 2 laps forward  Standing hip extension with 2.5# ankle weight x 20 each    HOME EXERCISE PROGRAM: Access Code: [facility] URL: [email or web address] Date: 12/15/2022 Prepared by: Mauri Reading  Exercises - Seated Diaphragmatic Breathing  - 3 x daily - 7 x weekly - Seated Transversus Abdominis Bracing  - 5 x  daily - 7 x weekly - 1 sets - 10 reps - 5 seg hold - Seated Hip Adduction Isometrics with Ball  - 5 x daily - 7 x weekly - 2 sets - 10 reps - 5 seg hold - Standing March with Counter Support  - 5 x daily - 7 x weekly - 2 sets - 5-10 reps - Standing Hip Abduction with Counter Support  - 5 x daily -  7 x weekly - 2 sets - 10 reps   ASSESSMENT:  CLINICAL IMPRESSION: 03/18/2023 Thyra Breed reporting to PT with moderate-to-severe pain and overall fatigue. He is requiring increased seated rest breaks today due to general fatigue today. However, he was able to tolerate increased challenge with weight bearing activities. Decreased exercise volume for today, and plan to increase at next visit as able.    OBJECTIVE IMPAIRMENTS: cardiopulmonary status limiting activity, decreased activity tolerance, decreased balance, decreased endurance, decreased mobility, difficulty walking, decreased strength, and pain.   ACTIVITY LIMITATIONS: carrying, lifting, bending, standing, squatting, sleeping, stairs, transfers, bed mobility, bathing, toileting, dressing, hygiene/grooming, and locomotion level  PARTICIPATION LIMITATIONS: meal prep, cleaning, interpersonal relationship, driving, shopping, community activity, occupation, and yard work  PERSONAL FACTORS: Past/current experiences, Profession, and 3+ comorbidities: PMHx includes CKD, CVA, HTN, OSA, Obesity   are also affecting patient's functional outcome.   REHAB POTENTIAL: Fair    CLINICAL DECISION MAKING: Evolving/moderate complexity  EVALUATION COMPLEXITY: Moderate   GOALS: Goals reviewed with patient? Yes  SHORT TERM GOALS: Target date: 12/19/2022  Patient will be independent with initial home program for core mm activation, and endurance program.  Baseline: provided at eval; to be progressed over next few tx sessions Goal status: MET   LONG TERM GOALS: Target date: 04/29/23, last updated 03/04/23   Patient will report improved overall functional  ability with FOTO score of 49 or higher.   Baseline: 27 01/21/23: 38% 03/04/23: 39% Goal status:ongoing   2.  Patient will demonstrate ability to ascend/descend stairs and inclined surfaces without pain or difficulty, using reciprocal pattern.  Goal status: ongoing; utilizing modified technique and B UE support as of 03/04/23  3.  Patient will report that he is able to perform all ADLs and IADLs without assistance.   02/11/23: Mod I; can stand 10-15 minutes for meal prep   03/04/23: Mod I; can stand 15-20 minutes, would like to use SPC more  Goal status: PROGRESSING  4.  Patient will report ability to safely lift at least 30# from floor to waist, and waist to counter height in order to ensure safe return to work activities.  Goal status: ongoing; limited by current fracture restrictions as of 03/04/23  PLAN:  PT FREQUENCY: 1x/week   PT DURATION: 8 additional weeks as of 03/04/23  PLANNED INTERVENTIONS: Therapeutic exercises, Therapeutic activity, Neuromuscular re-education, Balance training, Gait training, Patient/Family education, Self Care, Stair training, Aquatic Therapy, Cryotherapy, Moist heat, Manual therapy, and Re-evaluation.  PLAN FOR NEXT SESSION: HEP updates as appropriate, core strengthening progress as appropriate, UE and LE strengthening   Mauri Reading, PT, DPT  03/18/2023, 11:29 AM

## 2023-03-24 ENCOUNTER — Telehealth: Payer: Self-pay | Admitting: Gastroenterology

## 2023-03-24 NOTE — Telephone Encounter (Signed)
PT's daughter [name] is calling to find out why the recall for his colonoscopy is 2032 when he has a family history of colon cancer. He's under the impression that it was to be done every 2 years. Please advise.

## 2023-03-25 ENCOUNTER — Ambulatory Visit: Payer: BC Managed Care – PPO

## 2023-03-25 ENCOUNTER — Ambulatory Visit: Payer: BC Managed Care – PPO | Admitting: Cardiology

## 2023-03-25 DIAGNOSIS — M6281 Muscle weakness (generalized): Secondary | ICD-10-CM

## 2023-03-25 DIAGNOSIS — M5459 Other low back pain: Secondary | ICD-10-CM

## 2023-03-25 DIAGNOSIS — M546 Pain in thoracic spine: Secondary | ICD-10-CM | POA: Diagnosis not present

## 2023-03-25 DIAGNOSIS — R262 Difficulty in walking, not elsewhere classified: Secondary | ICD-10-CM

## 2023-03-25 NOTE — Telephone Encounter (Signed)
Recall date corrected to 08/2023. Call the daughter to advise. Left information on her confidential voicemail. Based on the results letter and current guidelines, his recall date would be in 3 years from the last colonoscopy which was 08/05/2020.

## 2023-03-25 NOTE — Telephone Encounter (Signed)
Patient's daughter was advised of corrected recall date.
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
## 2023-04-01 ENCOUNTER — Ambulatory Visit: Payer: BC Managed Care – PPO

## 2023-04-01 ENCOUNTER — Other Ambulatory Visit (HOSPITAL_COMMUNITY): Payer: Self-pay | Admitting: Neurosurgery

## 2023-04-01 DIAGNOSIS — S22078D Other fracture of T9-T10 vertebra, subsequent encounter for fracture with routine healing: Secondary | ICD-10-CM
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
## 2023-04-06 ENCOUNTER — Ambulatory Visit: Payer: BC Managed Care – PPO | Admitting: Internal Medicine

## 2023-04-09 ENCOUNTER — Ambulatory Visit: Payer: Self-pay | Attending: Family Medicine

## 2023-04-09 DIAGNOSIS — M546 Pain in thoracic spine: Secondary | ICD-10-CM | POA: Insufficient documentation

## 2023-04-09 DIAGNOSIS — M5459 Other low back pain: Secondary | ICD-10-CM | POA: Insufficient documentation

## 2023-04-09 DIAGNOSIS — R262 Difficulty in walking, not elsewhere classified: Secondary | ICD-10-CM | POA: Insufficient documentation

## 2023-04-09 DIAGNOSIS — M6281 Muscle weakness (generalized): Secondary | ICD-10-CM | POA: Insufficient documentation
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
## 2023-04-12 ENCOUNTER — Ambulatory Visit: Payer: BC Managed Care – PPO | Admitting: Internal Medicine

## 2023-04-12 ENCOUNTER — Other Ambulatory Visit (HOSPITAL_COMMUNITY): Payer: Self-pay

## 2023-04-12 ENCOUNTER — Encounter: Payer: Self-pay | Admitting: Internal Medicine

## 2023-04-12 ENCOUNTER — Telehealth: Payer: Self-pay

## 2023-04-12 VITALS — BP 128/75 | HR 77 | Temp 98.1°F | Ht 67.0 in | Wt 274.2 lb

## 2023-04-12 DIAGNOSIS — G4734 Idiopathic sleep related nonobstructive alveolar hypoventilation: Secondary | ICD-10-CM

## 2023-04-12 DIAGNOSIS — Z6841 Body Mass Index (BMI) 40.0 and over, adult: Secondary | ICD-10-CM

## 2023-04-12 DIAGNOSIS — G4733 Obstructive sleep apnea (adult) (pediatric): Secondary | ICD-10-CM

## 2023-04-12 MED ORDER — MODAFINIL 200 MG PO TABS: ORAL_TABLET | ORAL | 5 refills | Status: DC

## 2023-04-12 NOTE — Telephone Encounter (Signed)
*  [facility]  Pharmacy Patient Advocate Encounter   Received notification from CoverMyMeds that prior authorization for Modafinil 200MG  tablets  is required/requested.   Insurance verification completed.   The patient is insured through [employer] .   Per test claim: PA required; PA submitted to above mentioned insurance via CoverMyMeds Key/confirmation #/EOC [facility] Status is pending

## 2023-04-12 NOTE — Patient Instructions (Signed)
We can continue BIPAP 18/11 with Oxygen 2L/min  Modafinil reordered  Please let us know if we can help

## 2023-04-13 ENCOUNTER — Ambulatory Visit (HOSPITAL_COMMUNITY)
Admission: RE | Admit: 2023-04-13 | Discharge: 2023-04-13 | Disposition: A | Discharge status: Home / Self Care | Payer: BC Managed Care – PPO | Source: Ambulatory Visit | Attending: Neurosurgery | Admitting: Neurosurgery

## 2023-04-13 DIAGNOSIS — S22078D Other fracture of T9-T10 vertebra, subsequent encounter for fracture with routine healing: Secondary | ICD-10-CM

## 2023-04-13 NOTE — Telephone Encounter (Signed)
Pharmacy Patient Advocate Encounter  Received notification from [facility] that Prior Authorization for Modafinil 200mg  has been APPROVED from 04/13/2023 to 04/11/2024

## 2023-04-15 ENCOUNTER — Ambulatory Visit: Payer: Self-pay

## 2023-04-23 ENCOUNTER — Ambulatory Visit: Payer: Self-pay

## 2023-04-23 DIAGNOSIS — M5459 Other low back pain: Secondary | ICD-10-CM

## 2023-04-23 DIAGNOSIS — M546 Pain in thoracic spine: Secondary | ICD-10-CM

## 2023-04-23 DIAGNOSIS — M6281 Muscle weakness (generalized): Secondary | ICD-10-CM
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
## 2023-04-28 ENCOUNTER — Ambulatory Visit: Payer: Self-pay

## 2023-04-28 DIAGNOSIS — M6281 Muscle weakness (generalized): Secondary | ICD-10-CM

## 2023-04-28 DIAGNOSIS — M5459 Other low back pain: Secondary | ICD-10-CM

## 2023-04-28 DIAGNOSIS — R262 Difficulty in walking, not elsewhere classified: Secondary | ICD-10-CM

## 2023-04-28 DIAGNOSIS — M546 Pain in thoracic spine: Secondary | ICD-10-CM
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
## 2023-04-29 ENCOUNTER — Ambulatory Visit: Payer: Self-pay

## 2023-04-30 ENCOUNTER — Encounter: Payer: Self-pay | Admitting: Family Medicine

## 2023-04-30 ENCOUNTER — Ambulatory Visit (INDEPENDENT_AMBULATORY_CARE_PROVIDER_SITE_OTHER): Payer: Self-pay | Admitting: Family Medicine

## 2023-04-30 VITALS — BP 128/76 | HR 76 | Temp 98.1°F | Ht 67.0 in | Wt 272.4 lb

## 2023-04-30 DIAGNOSIS — H9193 Unspecified hearing loss, bilateral: Secondary | ICD-10-CM

## 2023-04-30 DIAGNOSIS — H6123 Impacted cerumen, bilateral: Secondary | ICD-10-CM

## 2023-04-30 NOTE — Progress Notes (Signed)
Subjective:  Patient ID: Sumner Boast, male    DOB: [birth year]  Age: 62 y.o. MRN: [id number]  CC:  Chief Complaint  Patient presents with   Cerumen Impaction    Pt notes has had to have his ears cleaned out before and now feels it is time again lessened hearing, notes no slight pain/pressure in the ears but unsure if related to CPAP mask     HPI Daryan Areola presents for above. Here with family member. Spanish and [place] spoken with understanding expressed.   Cerumen impaction with difficulty hearing. Bilateral cerumen impaction treated with lavage in January. Feels blocked past month - R>L. Minimal discomfort, no d/c. Minimal hearing on R.  No relief with otc treatment for earwax.    History Patient Active Problem List   Diagnosis Date Noted   Nocturnal hypoxemia 01/15/2023   Injury of thoracic spine (HCC) 10/22/2022   Dorsal (thoracic) vertebral fracture (HCC) 10/16/2022   Injury 10/15/2022   Urinary urgency 07/10/2022   Urinary frequency 07/10/2022   Morbid obesity with BMI of 40.0-44.9, adult (HCC) 09/10/2021   Fatigue 09/10/2021   History of colonic polyps    Polyp of ascending colon    Polyp of transverse colon    Polyp of cecum    Polyp of descending colon    Polyp of sigmoid colon    Hypersomnia 11/29/2017   Other headache syndrome 05/05/2017   Vertigo 05/05/2017   IFG (impaired fasting glucose) 08/21/2016   Obesity (BMI 30-39.9) 06/23/2013   Stress-induced cardiomyopathy -- essentially resolved    Cerebral infarction (HCC)    OSA (obstructive sleep apnea)    Hyperlipidemia    Physical deconditioning 01/24/2013   Tinea pedis 01/18/2013   Essential hypertension 01/13/2013   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   ARF (acute renal failure) with tubular necrosis requiring HD 01/11/2013   Anemia 01/11/2013   Thrombocytopenia, unspecified (HCC) 01/11/2013   History of Cardiac and respiratory arrest 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 narcotic  overdose 12/29/2012   Past Medical History:  Diagnosis Date   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HCC) 2014   post op    ARF (acute respiratory failure) (HCC) 2014   Blood dyscrasia    Chronic kidney disease 2014   ARF    Complication of anesthesia 2014   - post op - in combination of pain pills-  Cardiac and Respiratory Arrest- prior to sleep apnea diagonosis   Constipation    CVA (cerebral infarction)    Hyperlipidemia    Hypertension    Nocturnal hypoxemia 01/15/2023   OSA (obstructive sleep apnea)    Pre-diabetes    Short-term memory loss    Sleep apnea    Stress-induced cardiomyopathy -- RESOLVED    Post-op Narcotic induced Acute Hypoxic respiratory failure with respiratory and PEA cardiac arrest following accidental narcotic overdose; initial [place] 20-20%, now improved to 55%   Was supported with Impella Cardiac Cath - non-obstructive CAD ; 1 month after event - EF up from 25% to 55-60% - stable x 5 yrs   Thrombocytopenia (HCC) 2014   Past Surgical History:  Procedure Laterality Date   ACHILLES TENDON REPAIR     BIOPSY  08/01/2019   Procedure: BIOPSY;  Surgeon: Napoleon Form, MD;  Location: [facility];  Service: Endoscopy;;   BIOPSY  08/05/2020   Procedure: BIOPSY;  Surgeon: Napoleon Form, MD;  Location: [facility];  Service: Endoscopy;;   CARDIAC CATHETERIZATION  12/29/2012   RN LHC --> Impella. for severe shock  with low EF. Mild to moderate RV pressure elevation. Normal coronary arteries. Cardiac Output 2.5, Index 1.46.   COLONOSCOPY WITH PROPOFOL N/A 10/30/2015   Procedure: COLONOSCOPY WITH PROPOFOL;  Surgeon: Napoleon Form, MD;  Location: [facility];  Service: Endoscopy;  Laterality: N/A;   COLONOSCOPY WITH PROPOFOL N/A 08/01/2019   Procedure: COLONOSCOPY WITH PROPOFOL;  Surgeon: Napoleon Form, MD;  Location: [facility];  Service: Endoscopy;  Laterality: N/A;   COLONOSCOPY WITH PROPOFOL N/A 08/05/2020   Procedure: COLONOSCOPY WITH PROPOFOL;   Surgeon: Napoleon Form, MD;  Location: [facility];  Service: Endoscopy;  Laterality: N/A;   INSERTION OF DIALYSIS CATHETER Right 01/09/2013   Procedure: INSERTION OF DIALYSIS CATHETER;  Surgeon: Chuck Hint, MD;  Location: [facility] OR;  Service: Vascular;  Laterality: Right;   LAMINECTOMY WITH POSTERIOR LATERAL ARTHRODESIS LEVEL 3 N/A 10/15/2022   Procedure: THORACIC NINE-THORACIC TWELVE POSTERIOR INSTRUMENTED FUSION REDUCTION OF FRACTURE;  Surgeon: Bedelia Person, MD;  Location: [facility] OR;  Service: Neurosurgery;  Laterality: N/A;   LEFT AND RIGHT HEART CATHETERIZATION WITH CORONARY ANGIOGRAM  12/29/2012   Procedure: LEFT AND RIGHT HEART CATHETERIZATION WITH CORONARY ANGIOGRAM;  Surgeon: Tonny Bollman, MD;  Location: [facility] CATH LAB;  Service: Cardiovascular;;   POLYPECTOMY  08/01/2019   Procedure: POLYPECTOMY;  Surgeon: Napoleon Form, MD;  Location: [facility];  Service: Endoscopy;;   POLYPECTOMY  08/05/2020   Procedure: POLYPECTOMY;  Surgeon: Napoleon Form, MD;  Location: [facility];  Service: Endoscopy;;   TRANSTHORACIC ECHOCARDIOGRAM  12/29/2012   EF 25% with periapical HK/AKA.  --> Followup echo 7/25 -- EF of 35%   TRANSTHORACIC ECHOCARDIOGRAM  01/2013 ; 07/2015   a) EF 55-60%. No regional WMA. Grade 1 diastolic dysfunction. Mild LA dilatation.; b) Normal LV size with mild LV hypertrophy. EF 55-60%. Normal RV   TRANSTHORACIC ECHOCARDIOGRAM  03/2018   EF 55-60%.  NO RWMA. Normal valves   Allergies  Allergen Reactions   Codeine    Oxycontin [Oxycodone] Other (See Comments)    Oxygen levels drop   Prior to Admission medications   Medication Sig Start Date End Date Taking? Authorizing Provider  acetaminophen (TYLENOL) 325 MG tablet Take 1-2 tablets (325-650 mg total) by mouth every 4 (four) hours as needed for mild pain. 10/30/22  Yes Setzer, Lynnell Jude, PA-C  atorvastatin (LIPITOR) 10 MG tablet Take 1 tablet (10 mg total) by mouth at bedtime. 10/30/22  Yes Setzer,  Lynnell Jude, PA-C  clotrimazole (LOTRIMIN) 1 % cream Apply 1 Application topically 2 (two) times daily. 11/05/22  Yes Shade Flood, MD  cyclobenzaprine (FLEXERIL) 10 MG tablet    Yes [provider]  docusate sodium (COLACE) 100 MG capsule Take 1 capsule (100 mg total) by mouth 2 (two) times daily as needed for mild constipation. 10/30/22  Yes Setzer, Lynnell Jude, PA-C  methocarbamol (ROBAXIN) 500 MG tablet Take 1 tablet (500 mg total) by mouth every 6 (six) hours as needed for muscle spasms. 10/30/22  Yes Setzer, Lynnell Jude, PA-C  modafinil (PROVIGIL) 200 MG tablet 1-2 tabs daily as needed for sleepiness 04/12/23  Yes Young, Joni Fears D, MD  polyethylene glycol (MIRALAX / GLYCOLAX) 17 g packet Take 17 g by mouth 2 (two) times daily. 10/30/22  Yes Setzer, Lynnell Jude, PA-C  traMADol (ULTRAM) 50 MG tablet Take 1-2 tablets (50-100 mg total) by mouth every 6 (six) hours. 11/05/22  Yes Shade Flood, MD   Social History   Socioeconomic History   Marital status: Widowed  Spouse name: Not on file   Number of children: Not on file   Years of education: Not on file   Highest education level: 9th grade  Occupational History   Not on file  Tobacco Use   Smoking status: Former    Current packs/day: 0.00    Types: Cigarettes    Quit date: 06/20/1986    Years since quitting: 36.8   Smokeless tobacco: Never  Vaping Use   Vaping status: Never Used  Substance and Sexual Activity   Alcohol use: Yes    Comment: History of heavy use but only social at present.  10/29/15 - "a beer every week or so"   Drug use: No    Comment: Prior cocaine use but none since 2000   Sexual activity: Not Currently  Other Topics Concern   Not on file  Social History Narrative   He is widowed for 13 years, and has 2 daughters: Desiree Lucy & [address] [place], and one son Voshon Kerwood. the other history is daughter who is here with him today.    He works for [employer] and [employer] as  a [employer].   He quit smoking in 1988. He does drink alcohol socially, but he does have a history of heavy use in the past. He also has a prior history of cocaine use but none since 2000.   Social Determinants of Health   Financial Resource Strain: Low Risk  (12/03/2022)   Overall Financial Resource Strain (CARDIA)    Difficulty of Paying Living Expenses: Not very hard  Food Insecurity: No Food Insecurity (12/03/2022)   Hunger Vital Sign    Worried About Running Out of Food in the Last Year: Never true    Ran Out of Food in the Last Year: Never true  Transportation Needs: No Transportation Needs (12/03/2022)   PRAPARE - Administrator, Civil Service (Medical): No    Lack of Transportation (Non-Medical): No  Physical Activity: Unknown (12/03/2022)   Exercise Vital Sign    Days of Exercise per Week: 0 days    Minutes of Exercise per Session: Not on file  Stress: No Stress Concern Present (12/03/2022)   [employer] of Occupational Health - Occupational Stress Questionnaire    Feeling of Stress : Only a little  Social Connections: Moderately Integrated (12/03/2022)   Social Connection and Isolation Panel [NHANES]    Frequency of Communication with Friends and Family: More than three times a week    Frequency of Social Gatherings with Friends and Family: More than three times a week    Attends Religious Services: More than 4 times per year    Active Member of [employer] or Organizations: Yes    Attends Banker Meetings: More than 4 times per year    Marital Status: Widowed  Intimate Partner Violence: Not on file    Review of Systems   Objective:   Vitals:   04/30/23 0911  BP: 128/76  Pulse: 76  Temp: 98.1 F (36.7 C)  TempSrc: Temporal  SpO2: 96%  Weight: 272 lb 6.4 oz (123.6 kg)  Height: 5\' 7"  (1.702 m)     Physical Exam Vitals reviewed.  Constitutional:      General: He is not in acute distress.    Appearance: Normal appearance. He is  well-developed.  HENT:     Head: Normocephalic and atraumatic.     Right Ear: External ear normal. There is impacted cerumen.  Left Ear: External ear normal. There is impacted cerumen.     Ears:     Comments: Bilateral impacted cerumen, completely occluded canal on the right.  Visualized canal without edema or erythema, pinna nontender.  Left side with nearly occluded canal, impacted dark yellow, brown cerumen inferior aspect with small window of opening at the apex.  Unable to visualize TM.  No canal edema/erythema and pinna nontender. Cardiovascular:     Rate and Rhythm: Normal rate.  Pulmonary:     Effort: Pulmonary effort is normal.  Neurological:     Mental Status: He is alert and oriented to person, place, and time.  Psychiatric:        Mood and Affect: Mood normal.      Risks (including but not limited to unlikely but possible tympanic membrane perforation, infection), benefits, and alternatives discussed for bilateral cerumen lavage for impaction.  Verbal consent obtained after any questions were answered.  Procedure with lavage performed by medical assistant.  Exam after completion of lavage without visible tympanic membrane perforation or injury, canal without injury, improvement in hearing.  RTC precautions given.  Assessment & Plan:  Erskin Fedder is a 62 y.o. male . Bilateral impacted cerumen - Plan: Ear wax removal  Decreased hearing of both ears Lavaged as above with improvement in symptoms, no complications.  RTC precautions.  Has follow-up in December and can recheck at that time if needed.  Recommended over-the-counter Debrox every 6 months or so to lessen chance of recurrent impaction.  Handout given.  No orders of the defined types were placed in this encounter.  Patient Instructions  Debrox every 6 months may help prevent earwax buildup.  Return to the clinic or go to the nearest emergency room if any of your symptoms worsen or new symptoms  occur.  Acumulacin de cera en el odo en adultos Earwax Buildup, Adult Los odos producen una sustancia que se llama "cera". Esta ayuda a mantener [employer] microbios llamados bacterias y protege la piel de los odos. A veces, puede acumularse una cantidad muy grande de cera. Esto puede causar molestias o dificultar la audicin. Cules son las causas? La acumulacin de cera puede ocurrir cuando tiene una cantidad muy grande de cera en los odos. La cera se produce en la parte externa del canal auditivo. Esta debe desprenderse y salir del odo en pequeas cantidades con el tiempo. Pero si los odos del nio no pueden limpiarse como deberan, la cera puede acumularse. Qu incrementa el riesgo? Es ms probable que se le produzca una acumulacin de cera si: Se limpia los odos con hisopos de algodn. Se toca los odos. [place] tapones para los odos o auriculares internos con mucha frecuencia. [place] audfonos. Tambin es ms probable que se le produzca si: Es hombre. Es Neomia Dear persona de edad avanzada. Los odos producen ms cera por motivos naturales. Tiene canales auditivos estrechos o vello [employer] odos. Su cera es demasiado espesa o pegajosa. Tiene eccema. Se deshidrata. Esto significa que no tiene una cantidad suficiente de lquido en el cuerpo. Cules son los signos o sntomas? Los sntomas de acumulacin de cera incluyen los siguientes: No poder or bien. Sensacin de [employer] odo tapado. Sensacin de que el odo est tapado. Secrecin de lquido. Dolor o Development worker, community odo. Zumbidos en el odo. Tos o problemas de equilibrio. Cmo se diagnostica? La acumulacin de cera se puede diagnosticar en funcin de los sntomas, los antecedentes mdicos y un examen del odo. Durante el  examen, el mdico mirar dentro de su odo con un instrumento llamado otoscopio. Tambin pueden [employer], como una prueba de la audicin. Cmo se trata? La acumulacin de cera puede tratarse  de las siguientes maneras: Uso de gotas ticas. Extraccin de cera realizada por un mdico. El mdico puede hacer lo siguiente: Enjuagar el odo con agua. Usar una herramienta llamada cureta que tiene un bucle en el extremo. Usar un dispositivo de succin. Someterse a [place]. Esto puede Facilities manager graves. Siga estas indicaciones en su casa:  Limpieza de los odos [employer] odos como se lo haya indicado el mdico. Puede limpiarle la parte externa de los odos con un pao o un pauelo de papel. No se limpie los odos en exceso. No se introduzca nada en el odo, salvo que se lo indique el mdico. Esto incluye los hisopos de algodn. Indicaciones generales Use los medicamentos de venta libre y los recetados solamente como se lo haya indicado el mdico. Beba suficiente lquido para Pharmacologist el pis (orina) de color amarillo plido. Esto ayuda a Industrial/product designer. Si tiene audfonos, lmpielos como se lo hayan indicado. Concurra a todas las visitas de seguimiento. Si la cera se le acumula en los odos con frecuencia o si [place] audfonos, pregntele al mdico con qu frecuencia debe hacerse limpiar los odos. Comunquese con un mdico si: El dolor en el odo empeora. Tiene fiebre. Le sale pus, sangre u otro lquido del odo. Tiene prdida de la audicin. Tiene zumbidos en el odo que no desaparecen. Siente que la Biochemist, clinical vueltas. Esto se denomina vrtigo. Los sntomas no mejoran con Scientist, research (medical). Esta informacin no tiene Theme park manager el consejo del mdico. Asegrese de hacerle al mdico cualquier pregunta que tenga. Document Revised: 09/30/2022 Document Reviewed: 09/30/2022 Elsevier Patient Education  2024 Elsevier Inc.     Signed,   Meredith Staggers, MD [facility], [facility] 04/30/23 9:32 AM

## 2023-04-30 NOTE — Patient Instructions (Signed)
Debrox every 6 months may help prevent earwax buildup.  Return to the clinic or go to the nearest emergency room if any of your symptoms worsen or new symptoms occur.  Acumulacin de cera en el odo en adultos Earwax Buildup, Adult Los odos producen una sustancia que se llama "cera". Esta ayuda a mantener [employer] microbios llamados bacterias y protege la piel de los odos. A veces, puede acumularse una cantidad muy grande de cera. Esto puede causar molestias o dificultar la audicin. Cules son las causas? La acumulacin de cera puede ocurrir cuando tiene una cantidad muy grande de cera en los odos. La cera se produce en la parte externa del canal auditivo. Esta debe desprenderse y salir del odo en pequeas cantidades con el tiempo. Pero si los odos del nio no pueden limpiarse como deberan, la cera puede acumularse. Qu incrementa el riesgo? Es ms probable que se le produzca una acumulacin de cera si: Se limpia los odos con hisopos de algodn. Se toca los odos. [place] tapones para los odos o auriculares internos con mucha frecuencia. [place] audfonos. Tambin es ms probable que se le produzca si: Es hombre. Es Neomia Dear persona de edad avanzada. Los odos producen ms cera por motivos naturales. Tiene canales auditivos estrechos o vello [employer] odos. Su cera es demasiado espesa o pegajosa. Tiene eccema. Se deshidrata. Esto significa que no tiene una cantidad suficiente de lquido en el cuerpo. Cules son los signos o sntomas? Los sntomas de acumulacin de cera incluyen los siguientes: No poder or bien. Sensacin de [employer] odo tapado. Sensacin de que el odo est tapado. Secrecin de lquido. Dolor o Development worker, community odo. Zumbidos en el odo. Tos o problemas de equilibrio. Cmo se diagnostica? La acumulacin de cera se puede diagnosticar en funcin de los sntomas, los antecedentes mdicos y un examen del odo. Durante el examen, el mdico mirar dentro de su  odo con un instrumento llamado otoscopio. Tambin pueden [employer], como una prueba de la audicin. Cmo se trata? La acumulacin de cera puede tratarse de las siguientes maneras: Uso de gotas ticas. Extraccin de cera realizada por un mdico. El mdico puede hacer lo siguiente: Enjuagar el odo con agua. Usar una herramienta llamada cureta que tiene un bucle en el extremo. Usar un dispositivo de succin. Someterse a [place]. Esto puede Facilities manager graves. Siga estas indicaciones en su casa:  Limpieza de los odos [employer] odos como se lo haya indicado el mdico. Puede limpiarle la parte externa de los odos con un pao o un pauelo de papel. No se limpie los odos en exceso. No se introduzca nada en el odo, salvo que se lo indique el mdico. Esto incluye los hisopos de algodn. Indicaciones generales Use los medicamentos de venta libre y los recetados solamente como se lo haya indicado el mdico. Beba suficiente lquido para Pharmacologist el pis (orina) de color amarillo plido. Esto ayuda a Industrial/product designer. Si tiene audfonos, lmpielos como se lo hayan indicado. Concurra a todas las visitas de seguimiento. Si la cera se le acumula en los odos con frecuencia o si [place] audfonos, pregntele al mdico con qu frecuencia debe hacerse limpiar los odos. Comunquese con un mdico si: El dolor en el odo empeora. Tiene fiebre. Le sale pus, sangre u otro lquido del odo. Tiene prdida de la audicin. Tiene zumbidos en el odo que no desaparecen. Siente que la Biochemist, clinical vueltas. Esto se denomina vrtigo. Los sntomas no mejoran con  el tratamiento. Esta informacin no tiene Theme park manager el consejo del mdico. Asegrese de hacerle al mdico cualquier pregunta que tenga. Document Revised: 09/30/2022 Document Reviewed: 09/30/2022 Elsevier Patient Education  2024 [employer].

## 2023-05-01 ENCOUNTER — Encounter: Payer: Self-pay | Admitting: Family Medicine

## 2023-05-03 NOTE — Telephone Encounter (Signed)
Normal ear wax removal procedure performed Friday patient now reports pain and pressure in both ears please advise

## 2023-05-03 NOTE — Telephone Encounter (Signed)
No - should not be feeing this a few days out. Can he be seen by someone today?

## 2023-05-03 NOTE — Telephone Encounter (Signed)
Noted. Thanks for the update.

## 2023-05-03 NOTE — Telephone Encounter (Signed)
Pt has been scheduled at [facility] 11/26. Daughter states that pain has improved since sending the MyChart message. He's rating his pain level a 2. Daughter states if he is still improving that appointment may get canceled due to his insurance lapsing and being self pay. Daughter said she would call back if she needed anything else.

## 2023-05-04 ENCOUNTER — Ambulatory Visit: Payer: Self-pay | Admitting: Internal Medicine

## 2023-05-12 ENCOUNTER — Encounter: Payer: Self-pay | Admitting: Internal Medicine

## 2023-05-12 NOTE — Assessment & Plan Note (Signed)
Depends on supplemental oxygen at 2 L through his BiPAP machine for sleep. Presumed underlying problem is obesity hypoventilation but with uncertain component of COPD and this former smoker with history of CHF.

## 2023-05-12 NOTE — Assessment & Plan Note (Signed)
Benefits from BiPAP with supplemental oxygen.  Sleeping better but still has residual daytime hypersomnolence for which he uses modafinil and naps. Plan-continue BiPAP 18/11 with O2 2 L for sleep

## 2023-05-12 NOTE — Assessment & Plan Note (Signed)
Weight loss would help several of his medical problems considerably.  Likely to require external support.

## 2023-06-04 ENCOUNTER — Ambulatory Visit: Payer: Self-pay | Admitting: Family Medicine

## 2023-06-28 ENCOUNTER — Telehealth: Payer: Self-pay | Admitting: Family Medicine

## 2023-06-28 ENCOUNTER — Ambulatory Visit: Payer: Self-pay | Admitting: Family Medicine

## 2023-06-28 NOTE — Telephone Encounter (Signed)
  Chief Complaint: left sided facial swelling Symptoms: left sided facial swelling, fever, cough, body aches Frequency: x 3 days Pertinent Negatives: Patient denies facial injury, tooth Disposition: [x] ED /[] Urgent Care (no appt availability in office) / [] Appointment(In office/virtual)/ []  [facility]/ [] Home Care/ [x] Refused Recommended Disposition /[] [facility]/ []  Follow-up with PCP Additional Notes: Spoke with patient's daughter, [name]. Limited triage assessment due to patient not with daughter.  Daughter refusing ED disposition and requesting office appointment. Called [facility] and spoke with staff member.  Copied from CRM [id number]. Topic: Clinical - Red Word Triage >> Jun 28, 2023  8:37 AM Leavy Cella D wrote: Red Word that prompted transfer to Nurse Triage: Swollen on the left side of the ear , extreme pain Reason for Disposition  Fever  Answer Assessment - Initial Assessment Questions 1. ONSET: "When did the swelling start?" (e.g., minutes, hours, days)     Daughter states he noticed it around Saturday evening and yesterday it appeared bigger when he woke up.  2. LOCATION: "What part of the face is swollen?"     Left side of cheek/jaw near left ear.  3. SEVERITY: "How swollen is it?"     Moderate.  4. ITCHING: "Is there any itching?" If Yes, ask: "How much?"   (Scale 1-10; mild, moderate or severe)     Daughter unsure if patient complaining of itching.  5. PAIN: "Is the swelling painful to touch?" If Yes, ask: "How painful is it?"   (Scale 1-10; mild, moderate or severe)   - NONE (0): no pain   - MILD (1-3): doesn't interfere with normal activities    - MODERATE (4-7): interferes with normal activities or awakens from sleep    - SEVERE (8-10): excruciating pain, unable to do any normal activities      5/10.  6. FEVER: "Do you have a fever?" If Yes, ask: "What is it, how was it measured, and when did it start?"      Daughter states patient has had fevers for  the past few days, she is not sure how high.  7. CAUSE: "What do you think is causing the face swelling?"     Unsure, she states her sister is a Sales executive and examined his teeth and did not see anything relating to his mouth or teeth.  8. RECURRENT SYMPTOM: "Have you had face swelling before?" If Yes, ask: "When was the last time?" "What happened that time?"     Denies.  9. OTHER SYMPTOMS: "Do you have any other symptoms?" (e.g., toothache, leg swelling)     Cough, body aches, fever.  Protocols used: Face Swelling-A-AH

## 2023-06-28 NOTE — Telephone Encounter (Signed)
Pt has been scheduled for 06/30/2023

## 2023-06-28 NOTE — Telephone Encounter (Signed)
Pt was given a sooner appt with another provider

## 2023-06-28 NOTE — Telephone Encounter (Signed)
Noted, but I do agree with ER evaluation especially with any facial swelling and fever.  I do recommend ER evaluation today.

## 2023-06-28 NOTE — Telephone Encounter (Signed)
E2C2 nurse Judeth Cornfield called  Patient's daughter was stating that pt was experiencing symptoms Left side facial swelling and fevers  Nurse Recommendation per protocol: Go to the ER, daughter refused and stated they would wait for an appointment with Dr. Neva Seat.  Will call daughter, [name] to set up an appointment

## 2023-06-28 NOTE — Telephone Encounter (Signed)
Noted, see other note regarding this disposition, ER evaluation was recommended which I agree with.

## 2023-06-28 NOTE — Telephone Encounter (Signed)
Pts daughter states her dad refused ER visit

## 2023-06-28 NOTE — Telephone Encounter (Signed)
Noted, thanks for update 

## 2023-06-29 ENCOUNTER — Ambulatory Visit: Payer: Self-pay | Admitting: Family Medicine

## 2023-06-30 ENCOUNTER — Telehealth: Payer: Self-pay | Admitting: Family Medicine

## 2023-07-15 ENCOUNTER — Ambulatory Visit: Payer: Self-pay | Admitting: Cardiology

## 2023-09-06 ENCOUNTER — Encounter (HOSPITAL_BASED_OUTPATIENT_CLINIC_OR_DEPARTMENT_OTHER): Payer: Self-pay

## 2023-09-08 ENCOUNTER — Ambulatory Visit: Payer: Self-pay | Admitting: Cardiology

## 2023-10-12 ENCOUNTER — Ambulatory Visit: Payer: BC Managed Care – PPO | Admitting: Internal Medicine

## 2023-10-15 HISTORY — PX: BACK SURGERY: SHX140

## 2023-11-29 ENCOUNTER — Encounter: Payer: Self-pay | Admitting: Cardiology

## 2023-11-29 ENCOUNTER — Ambulatory Visit: Payer: Self-pay | Attending: Cardiology | Admitting: Cardiology

## 2023-11-29 VITALS — BP 135/76 | HR 82 | Ht 67.0 in | Wt 265.5 lb

## 2023-11-29 DIAGNOSIS — I469 Cardiac arrest, cause unspecified: Secondary | ICD-10-CM | POA: Diagnosis not present

## 2023-11-29 DIAGNOSIS — I5181 Takotsubo syndrome: Secondary | ICD-10-CM

## 2023-11-29 DIAGNOSIS — G4733 Obstructive sleep apnea (adult) (pediatric): Secondary | ICD-10-CM | POA: Diagnosis not present

## 2023-11-29 DIAGNOSIS — R42 Dizziness and giddiness: Secondary | ICD-10-CM

## 2023-11-29 DIAGNOSIS — I10 Essential (primary) hypertension: Secondary | ICD-10-CM

## 2023-11-29 DIAGNOSIS — Z6841 Body Mass Index (BMI) 40.0 and over, adult: Secondary | ICD-10-CM

## 2023-11-29 DIAGNOSIS — R0601 Orthopnea: Secondary | ICD-10-CM

## 2023-11-29 NOTE — Patient Instructions (Signed)
Medication Instructions:  °No changes ° °*If you need a refill on your cardiac medications before your next appointment, please call your pharmacy* ° ° °Lab Work: ° °Not needed ° ° °Testing/Procedures: ° °Not needed ° °Follow-Up: °At [facility], you and your health needs are our priority.  As part of our continuing mission to provide you with exceptional heart care, we have created designated Provider Care Teams.  These Care Teams include your primary Cardiologist (physician) and Advanced Practice Providers (APPs -  Physician Assistants and Nurse Practitioners) who all work together to provide you with the care you need, when you need it. ° °  ° °Your next appointment:   °  as needed ° °The format for your next appointment:   °In Person ° °Provider:   °David Harding, MD  ° ° ° °
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
## 2023-12-05 ENCOUNTER — Encounter: Payer: Self-pay | Admitting: Cardiology

## 2023-12-05 DIAGNOSIS — R0601 Orthopnea: Secondary | ICD-10-CM | POA: Insufficient documentation

## 2023-12-05 DIAGNOSIS — R42 Dizziness and giddiness: Secondary | ICD-10-CM | POA: Insufficient documentation

## 2023-12-05 NOTE — Assessment & Plan Note (Signed)
 Stress-induced cardiomyopathy as a result of hypoxia ventricular failure related to PEA arrest.  He was in shock, temporarily supported with Impella.  Cardiac catheter showed nonobstructive CAD. Completely resolved and no longer medications.

## 2023-12-05 NOTE — Assessment & Plan Note (Signed)
 Currently on statin.  Last LDL was 79-this was from June of last year.  He has been on 10 mg rosuvastatin for many years.  Labs have been pretty well-controlled. Should be due for labs rechecked by PCP this year. We clearly have time to allow for 1 month statin holiday to see if holding statin for 1 month would help some of his fatigue and foggy headedness.  -If no change after being off statin for 1 month, would restart.

## 2023-12-05 NOTE — Assessment & Plan Note (Signed)
 Dizziness with positional changes likely vestibular or inner ear related. Double vision during episodes may need neurology or ophthalmology evaluation. - Discuss neurology referral with primary care if dizziness is bothersome. - Ensure adequate hydration and sleep. - Advise time allowance when changing positions.

## 2023-12-05 NOTE — Assessment & Plan Note (Signed)
 May very well benefit from weight loss-discussed dietary modification to try and increase exercise level.  I think a lot of his lack of exercise may be lack of motivation.  Stressed importance of trying to get some exercise when there is walking, patient bike etc.

## 2023-12-05 NOTE — Assessment & Plan Note (Signed)
 Shortness of breath on left side, not on right or with BiPAP. No murmurs or abnormal heart sounds. Possible pressure on lung or anatomical variation, cause unclear.

## 2023-12-05 NOTE — Assessment & Plan Note (Addendum)
 Post PEA arrest with ROSC after CPR.  This was postprocedural after a Achilles tendon surgery, likely related to narcotic overtreatment.  No CAD on cath.  He does have some risk factors of borderline hyperlipidemia. Heart function normal (60-65% EF). Past cardiac arrest may have caused cognitive dysfunction due to cerebral hypoperfusion affecting watershed distribution.. - Allow slight increase in blood pressure for cerebral perfusion.  - Consider holding cholesterol medication for a month to assess energy impact.

## 2023-12-05 NOTE — Assessment & Plan Note (Signed)
 He had been on BiPAP with supplemental oxygen in the past.  Not sure if he still using BiPAP or just simply CPAP.  Needs to continue to follow with pulmonary medicine.  With him having chronic fatigue, he is also taking Provigil  along with using CPAP.  Most likely etiologies for his fatigue are essentially treated

## 2023-12-16 ENCOUNTER — Encounter: Payer: Self-pay | Admitting: Internal Medicine

## 2023-12-16 ENCOUNTER — Ambulatory Visit: Admitting: Internal Medicine

## 2023-12-16 VITALS — BP 136/78 | HR 83 | Temp 98.5°F | Ht 67.0 in | Wt 269.2 lb

## 2023-12-16 DIAGNOSIS — G4733 Obstructive sleep apnea (adult) (pediatric): Secondary | ICD-10-CM

## 2023-12-16 DIAGNOSIS — G4734 Idiopathic sleep related nonobstructive alveolar hypoventilation: Secondary | ICD-10-CM

## 2023-12-16 MED ORDER — MODAFINIL 200 MG PO TABS: ORAL_TABLET | ORAL | 5 refills | Status: DC
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
## 2023-12-16 NOTE — Patient Instructions (Addendum)
 Order- DME [facility]- please install SD card, continue BIPAP 18/11, PS3, mask of choice, supplies, heated humidification  Script sent refilling modafinil 

## 2023-12-31 ENCOUNTER — Encounter: Payer: Self-pay | Admitting: Internal Medicine

## 2024-01-05 NOTE — Assessment & Plan Note (Signed)
 Benefits from BIPAP with O2- good compliance and control Plan- continue 18/11, PS3
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
## 2024-01-18 ENCOUNTER — Encounter

## 2024-01-28 ENCOUNTER — Encounter: Admitting: Gastroenterology

## 2024-02-10 ENCOUNTER — Other Ambulatory Visit: Payer: Self-pay | Admitting: Family Medicine

## 2024-02-10 ENCOUNTER — Ambulatory Visit (INDEPENDENT_AMBULATORY_CARE_PROVIDER_SITE_OTHER): Admitting: Family Medicine

## 2024-02-10 VITALS — BP 122/60 | HR 85 | Temp 98.6°F | Ht 66.25 in | Wt 263.2 lb

## 2024-02-10 DIAGNOSIS — R3915 Urgency of urination: Secondary | ICD-10-CM | POA: Diagnosis not present

## 2024-02-10 DIAGNOSIS — R7303 Prediabetes: Secondary | ICD-10-CM

## 2024-02-10 DIAGNOSIS — Z23 Encounter for immunization: Secondary | ICD-10-CM | POA: Diagnosis not present

## 2024-02-10 DIAGNOSIS — H532 Diplopia: Secondary | ICD-10-CM

## 2024-02-10 DIAGNOSIS — Z125 Encounter for screening for malignant neoplasm of prostate: Secondary | ICD-10-CM | POA: Diagnosis not present

## 2024-02-10 DIAGNOSIS — Z0000 Encounter for general adult medical examination without abnormal findings: Secondary | ICD-10-CM

## 2024-02-10 DIAGNOSIS — R42 Dizziness and giddiness: Secondary | ICD-10-CM

## 2024-02-10 DIAGNOSIS — E785 Hyperlipidemia, unspecified: Secondary | ICD-10-CM

## 2024-02-10 LAB — POCT URINALYSIS DIP (MANUAL ENTRY)
Bilirubin, UA: NEGATIVE
Blood, UA: NEGATIVE
Glucose, UA: NEGATIVE mg/dL
Leukocytes, UA: NEGATIVE
Nitrite, UA: NEGATIVE
Protein Ur, POC: NEGATIVE mg/dL
Spec Grav, UA: 1.02 (ref 1.010–1.025)
Urobilinogen, UA: 0.2 U/dL
pH, UA: 6 (ref 5.0–8.0)

## 2024-02-10 MED ORDER — ATORVASTATIN CALCIUM 10 MG PO TABS: 10.0000 mg | ORAL_TABLET | Freq: Every day | ORAL | 1 refills | Status: DC

## 2024-02-10 MED ORDER — ATORVASTATIN CALCIUM 10 MG PO TABS
10.0000 mg | ORAL_TABLET | Freq: Every day | ORAL | 0 refills | Status: DC
Start: 2024-02-10 — End: 2024-02-10

## 2024-02-10 NOTE — Patient Instructions (Signed)
 Restart atorvastatin  once per day for cholesterol.  I will check labs today but I expect that level will be elevated off medication.  I will refer you to neurology for the dizziness and double vision in the mornings.  If any new symptoms be seen.  Keep follow-up with other specialist as planned.  I will check urine test, prostate test today, follow-up in the next few weeks and we can discuss the symptoms further and decide if a follow-up with urology will be needed.  Return to the clinic or go to the nearest emergency room if any of your symptoms worsen or new symptoms occur.  Cuidados preventivos en los hombres de 40 a 64 aos de edad Preventive Care 28-69 Years Old, Male Los cuidados preventivos hacen referencia a las opciones en cuanto al estilo de vida y a las visitas al mdico, las cuales pueden promover la salud y Counsellor. Las visitas de cuidado preventivo tambin se denominan exmenes de Health visitor. Qu puedo esperar para mi visita de cuidado preventivo? Asesoramiento Durante la visita de cuidado preventivo, el mdico puede preguntarle sobre lo siguiente: Antecedentes mdicos, incluidos los siguientes: Problemas mdicos pasados. Antecedentes mdicos familiares. Salud actual, incluido lo siguiente: Su bienestar emocional. Training and development officer y las relaciones personales. Su actividad sexual. Estilo de vida, incluido lo siguiente: Consumo de alcohol, nicotina, tabaco o drogas. Acceso a armas de fuego. Hbitos de alimentacin, ejercicio y sueo. Cuestiones de seguridad, como el uso de cinturn de seguridad y casco de bicicleta. Uso de pantalla solar. Su trabajo y [place] laboral. Examen fsico El mdico revisar lo siguiente: Diplomatic Services operational officer y [place]. Estos pueden usarse para calcular el IMC (ndice de masa corporal). El [facility] es una medicin que indica si tiene un peso saludable. Circunferencia de la cintura. Es ignacia medicin alrededor de Lobbyist. Esta medicin tambin indica si tiene  un peso saludable y puede ayudar a predecir su riesgo de padecer ciertas enfermedades, como diabetes tipo 2 y presin arterial alta. Frecuencia cardaca y presin arterial. Temperatura corporal. Piel para detectar manchas anormales. Qu vacunas necesito?  Las vacunas se aplican a varias edades, segn un cronograma. El [employer] recomendar vacunas segn su edad, sus antecedentes mdicos, su estilo de vida y [address], como los viajes o el lugar donde trabaja. Qu pruebas necesito? Pruebas de deteccin El mdico puede recomendar pruebas de deteccin de ciertas afecciones. Esto puede incluir: Niveles de lpidos y colesterol. Pruebas de deteccin de la diabetes. Esto se Physiological scientist un control del azcar en la sangre (glucosa) despus de no haber comido durante un periodo de tiempo (ayuno). Prueba de hepatitis B. Prueba de hepatitis C. Prueba del VIH (virus de inmunodeficiencia humana). Pruebas de infecciones de transmisin sexual (ITS), si est en riesgo. Pruebas de deteccin de cncer de pulmn. Examen de deteccin del cncer de prstata. Pruebas de deteccin de Building services engineer. Hable con su mdico sobre los [employer], las opciones de tratamiento y, si corresponde, la necesidad de Education officer, environmental ms pruebas. Siga estas instrucciones en su casa: Comida y bebida  Siga una dieta que incluya frutas y verduras frescas, cereales integrales, protenas magras y productos lcteos descremados. Tome los suplementos vitamnicos y [employer] se lo haya indicado el mdico. No beba alcohol si el mdico se lo prohbe. Si bebe alcohol: Limite la cantidad que consume de 0 a 2 medidas por da. Sepa cunta cantidad de alcohol hay en las bebidas que toma. En los [address], una medida [place] a una botella de  cerveza de 12 oz (355 ml), un vaso de vino de 5 oz (148 ml) o un vaso de una bebida alcohlica de alta graduacin de 1 oz (44 ml). Estilo de [employer] dientes  a la maana y a la noche con Conservator, museum/gallery con fluoruro. Use hilo dental una vez al da. Haga al menos 30 minutos de ejercicio, 5 o ms [address]. No consuma ningn producto que contenga nicotina o tabaco. Estos productos incluyen cigarrillos, tabaco para Theatre manager y aparatos de vapeo, como los cigarrillos electrnicos. Si necesita ayuda para dejar de fumar, consulte al mdico. No consuma drogas. Si es sexualmente activo, practique sexo seguro. Use un condn u otra forma de proteccin para prevenir las infecciones de transmisin sexual (ITS). Tome aspirina nicamente como se lo haya indicado el mdico. Asegrese de que comprende qu cantidad y cul presentacin debe tomar. Trabaje con el mdico para averiguar si es seguro y beneficioso para usted tomar aspirina a diario. Busque maneras saludables de Charity fundraiser, tales como: Meditacin, yoga o escuchar msica. Lleve un diario personal. Hable con una persona confiable. Pase tiempo con amigos y familiares. Minimice la exposicin a la radiacin UV para reducir el riesgo de cncer de piel. Seguridad [place]  siempre el cinturn de seguridad al conducir o viajar en un vehculo. No conduzca: Si ha estado bebiendo alcohol. No viaje con un conductor que ha estado bebiendo. Si est cansado o distrado. Mientras est enviando mensajes de texto. Si ha estado usando sustancias o drogas que alteran la funcin mental. Use un casco y otros equipos de proteccin durante las actividades deportivas. Si tiene armas de fuego en su casa, asegrese de seguir todos los procedimientos de seguridad correspondientes. Cundo volver? Acuda al mdico una vez al ao para una visita anual de control de bienestar. Pregntele al mdico con qu frecuencia debe realizarse un control de la vista y los dientes. Mantenga su esquema de vacunacin al da. Esta informacin no tiene Theme park manager el consejo del mdico. Asegrese de hacerle al mdico cualquier pregunta  que tenga. Document Revised: 12/12/2020 Document Reviewed: 12/12/2020 Elsevier Patient Education  2024 [employer].

## 2024-02-10 NOTE — Progress Notes (Signed)
 Subjective:  Patient ID: Nicholas Potts, male    DOB: 03/18/1961  Age: 63 y.o. MRN: [id number]  CC:  Chief Complaint  Patient presents with   Annual Exam    Pt doing well, pt has not had cholesterol meds for 3 months     HPI Nicholas Potts presents for Annual Exam.   PCP, me Cardiology, Dr. Anner, history of stress-induced cardiomyopathy, OSA, prior cardiac respiratory arrest 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 narcotic overdose.  Last visit 11/29/2023.  Chronic dizziness with positional changes  -recommend discussion with me, as noted double vision during episodes.  Orthostatic precautions were given.  Follow-up with pulmonary medicine for sleep apnea. Pulmonary, Dr. Neysa -  OSA, EDS, recent visit July 10.  On BiPAP.  Continue BiPAP with 77-month follow-up, continued modafinil . Neurosurgery, Dr. Debby, Prior thoracic fracture. Urology - no recent visit. Prior urinary leakage, dribbling improved. Still some chronic urgency. Plans on labs today then follow up to discuss further.    Prediabetes with obesity No current meds.  Due for updated labs. Weight 272 in 04/2023.  Static bicycle for exercise.  Rare fast food, no sweet tea or soda. Drinking more water.  Lab Results  Component Value Date   HGBA1C 5.9 12/04/2022   Wt Readings from Last 3 Encounters:  02/10/24 263 lb 3.2 oz (119.4 kg)  12/16/23 269 lb 3.2 oz (122.1 kg)  11/29/23 265 lb 8 oz (120.4 kg)   Hyperlipidemia: Lipitor 10 mg daily.  Due for updated labs. Has been out of meds - ran out for 3-4 months ago. No side effects on meds.  Lab Results  Component Value Date   CHOL 142 12/04/2022   HDL 31.20 (L) 12/04/2022   LDLCALC 79 12/04/2022   LDLDIRECT 124.0 06/12/2022   TRIG 159.0 (H) 12/04/2022   CHOLHDL 5 12/04/2022   Lab Results  Component Value Date   ALT 33 12/04/2022   AST 23 12/04/2022   ALKPHOS 96 12/04/2022   BILITOT 0.5 12/04/2022   New concern: Dizziness with diplopia Noted from recent  cardiology visit in June.  Noted at that time he was having dizziness when lying down to standing, accompanied by diplopia, needing to close his eyes to then regain normal vision.  Symptoms reportedly improved after 10 to 15 seconds. Has noticed past 4-5 months. Notices every day when waking - only in the morning.     02/10/2024    3:17 PM 04/30/2023    9:09 AM 12/03/2022   10:54 AM 04/07/2022   10:50 AM 06/16/2021    9:21 AM  Depression screen PHQ 2/9  Decreased Interest 0 0 0 0 1  Down, Depressed, Hopeless 0 0 0 0 1  PHQ - 2 Score 0 0 0 0 2  Altered sleeping 3 0 0 0 0  Tired, decreased energy 2 1 0 0 3  Change in appetite 1 0 0 0 2  Feeling bad or failure about yourself  0 0 0 0 0  Trouble concentrating 0 0 0 0 0  Moving slowly or fidgety/restless 0 0 0 0 0  Suicidal thoughts 0 0 0 0 0  PHQ-9 Score 6 1 0 0 7  Difficult doing work/chores Not difficult at all   Not difficult at all     Health Maintenance  Topic Date Due   Pneumococcal Vaccine: 50+ Years (1 of 1 - PCV) Never done   Zoster Vaccines- Shingrix  (2 of 2) 09/25/2022   INFLUENZA VACCINE  01/07/2024   DTaP/Tdap/Td (  2 - Td or Tdap) 10/22/2027   Colonoscopy  08/05/2030   Hepatitis C Screening  Completed   HIV Screening  Completed   Hepatitis B Vaccines 19-59 Average Risk  Aged Out   HPV VACCINES  Aged Out   Meningococcal B Vaccine  Aged Out   COVID-19 Vaccine  Discontinued  Colonoscopy 2022, repeat 3 years, Dr. Nandigam. Colonoscopy scheduled in October.  Prostate: does not have family history of prostate cancer The natural history of prostate cancer and ongoing controversy regarding screening and potential treatment outcomes of prostate cancer has been discussed with the patient. The meaning of a false positive PSA and a false negative PSA has been discussed. He indicates understanding of the limitations of this screening test and wishes to proceed with screening PSA testing. Lab Results  Component Value Date   PSA1 0.7  05/20/2020   PSA1 0.7 10/21/2017   PSA1 0.7 02/05/2017   PSA 0.74 06/12/2022   PSA 0.73 05/28/2021   PSA 0.82 06/29/2015      Immunization History  Administered Date(s) Administered   Influenza,inj,Quad PF,6+ Mos 06/29/2015, 02/22/2019   Influenza-Unspecified 04/22/2021   PFIZER(Purple Top)SARS-COV-2 Vaccination 08/16/2019, 09/06/2019, 04/23/2020   Respiratory Syncytial Virus Vaccine ,Recomb Aduvanted(Arexvy ) 07/31/2022   Tdap 10/21/2017   Zoster Recombinant(Shingrix ) 07/31/2022  Flu vaccine and second shingrix  today.  No results found. Optho - overdue - few years ago - glasses. Advised to schedule   Dental: every 6 mo  Alcohol: rare - 1 beer per week.   Tobacco: none.   Exercise: static bicycle.    History Patient Active Problem List   Diagnosis Date Noted   Dizziness 12/05/2023   Orthopnea 12/05/2023   Nocturnal hypoxemia 01/15/2023   Injury of thoracic spine (HCC) 10/22/2022   Dorsal (thoracic) vertebral fracture (HCC) 10/16/2022   Injury 10/15/2022   Urinary urgency 07/10/2022   Urinary frequency 07/10/2022   Morbid obesity with BMI of 40.0-44.9, adult (HCC) 09/10/2021   Fatigue 09/10/2021   History of colonic polyps    Polyp of ascending colon    Polyp of transverse colon    Polyp of cecum    Polyp of descending colon    Polyp of sigmoid colon    Hypersomnia 11/29/2017   Other headache syndrome 05/05/2017   Vertigo 05/05/2017   IFG (impaired fasting glucose) 08/21/2016   Obesity (BMI 30-39.9) 06/23/2013   Stress-induced cardiomyopathy -- essentially resolved    Cerebral infarction (HCC)    OSA (obstructive sleep apnea)    Hyperlipidemia with target LDL less than 100    Physical deconditioning 01/24/2013   Tinea pedis 01/18/2013   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   ARF (acute renal failure) with tubular necrosis requiring HD 01/11/2013   Anemia 01/11/2013   Thrombocytopenia, unspecified (HCC) 01/11/2013   History of Cardiac and  respiratory arrest 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 narcotic overdose 12/29/2012   Past Medical History:  Diagnosis Date   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HCC) 2014   post op    ARF (acute respiratory failure) (HCC) 2014   Blood dyscrasia    Chronic kidney disease 2014   ARF    Complication of anesthesia 2014   - post op - in combination of pain pills-  Cardiac and Respiratory Arrest- prior to sleep apnea diagonosis   Constipation    CVA (cerebral infarction)    Hyperlipidemia    Hypertension    Nocturnal hypoxemia 01/15/2023   OSA (obstructive sleep apnea)    Pre-diabetes    Short-term memory loss  Sleep apnea    Stress-induced cardiomyopathy -- RESOLVED    Post-op Narcotic induced Acute Hypoxic respiratory failure with respiratory and PEA cardiac arrest following accidental narcotic overdose; initial US  20-20%, now improved to 55%   Was supported with Impella Cardiac Cath - non-obstructive CAD ; 1 month after event - EF up from 25% to 55-60% - stable x 5 yrs   Thrombocytopenia (HCC) 2014   Past Surgical History:  Procedure Laterality Date   ACHILLES TENDON REPAIR     BACK SURGERY  10/15/2023   placed 8 screws   BIOPSY  08/01/2019   Procedure: BIOPSY;  Surgeon: Shila Gustav GAILS, MD;  Location: [facility];  Service: Endoscopy;;   BIOPSY  08/05/2020   Procedure: BIOPSY;  Surgeon: Shila Gustav GAILS, MD;  Location: [facility];  Service: Endoscopy;;   CARDIAC CATHETERIZATION  12/29/2012   RN LHC --> Impella. for severe shock with low EF. Mild to moderate RV pressure elevation. Normal coronary arteries. Cardiac Output 2.5, Index 1.46.   COLONOSCOPY WITH PROPOFOL  N/A 10/30/2015   Procedure: COLONOSCOPY WITH PROPOFOL ;  Surgeon: Gustav Shila GAILS, MD;  Location: [facility];  Service: Endoscopy;  Laterality: N/A;   COLONOSCOPY WITH PROPOFOL  N/A 08/01/2019   Procedure: COLONOSCOPY WITH PROPOFOL ;  Surgeon: Shila Gustav GAILS, MD;  Location: [facility];  Service: Endoscopy;   Laterality: N/A;   COLONOSCOPY WITH PROPOFOL  N/A 08/05/2020   Procedure: COLONOSCOPY WITH PROPOFOL ;  Surgeon: Shila Gustav GAILS, MD;  Location: [facility];  Service: Endoscopy;  Laterality: N/A;   INSERTION OF DIALYSIS CATHETER Right 01/09/2013   Procedure: INSERTION OF DIALYSIS CATHETER;  Surgeon: Lonni GORMAN Blade, MD;  Location: [facility] OR;  Service: Vascular;  Laterality: Right;   LAMINECTOMY WITH POSTERIOR LATERAL ARTHRODESIS LEVEL 3 N/A 10/15/2022   Procedure: THORACIC NINE-THORACIC TWELVE POSTERIOR INSTRUMENTED FUSION REDUCTION OF FRACTURE;  Surgeon: Debby Dorn MATSU, MD;  Location: [facility] OR;  Service: Neurosurgery;  Laterality: N/A;   LEFT AND RIGHT HEART CATHETERIZATION WITH CORONARY ANGIOGRAM  12/29/2012   Procedure: LEFT AND RIGHT HEART CATHETERIZATION WITH CORONARY ANGIOGRAM;  Surgeon: Ozell Fell, MD;  Location: [facility] CATH LAB;  Service: Cardiovascular;;   POLYPECTOMY  08/01/2019   Procedure: POLYPECTOMY;  Surgeon: Shila Gustav GAILS, MD;  Location: [facility];  Service: Endoscopy;;   POLYPECTOMY  08/05/2020   Procedure: POLYPECTOMY;  Surgeon: Shila Gustav GAILS, MD;  Location: [facility];  Service: Endoscopy;;   TRANSTHORACIC ECHOCARDIOGRAM  12/29/2012   EF 25% with periapical HK/AKA.  --> Followup echo 7/25 -- EF of 35%   TRANSTHORACIC ECHOCARDIOGRAM  01/2013 ; 07/2015   a) EF 55-60%. No regional WMA. Grade 1 diastolic dysfunction. Mild LA dilatation.; b) Normal LV size with mild LV hypertrophy. EF 55-60%. Normal RV   TRANSTHORACIC ECHOCARDIOGRAM  03/2018   EF 55-60%.  NO RWMA. Normal valves   Allergies  Allergen Reactions   Codeine    Oxycontin  [Oxycodone ] Other (See Comments)    Oxygen levels drop   Prior to Admission medications   Medication Sig Start Date End Date Taking? Authorizing Provider  acetaminophen  (TYLENOL ) 325 MG tablet Take 1-2 tablets (325-650 mg total) by mouth every 4 (four) hours as needed for mild pain. 10/30/22  Yes Setzer, Nena PARAS, PA-C   atorvastatin  (LIPITOR) 10 MG tablet Take 1 tablet (10 mg total) by mouth at bedtime. 10/30/22  Yes Setzer, Sandra J, PA-C  modafinil  (PROVIGIL ) 200 MG tablet 1-2 tabs daily as needed for sleepiness 12/16/23  Yes Neysa Rama D, MD  traMADol  (ULTRAM ) 50  MG tablet Take 1-2 tablets (50-100 mg total) by mouth every 6 (six) hours. 11/05/22  Yes Levora Reyes SAUNDERS, MD   Social History   Socioeconomic History   Marital status: Widowed    Spouse name: Not on file   Number of children: Not on file   Years of education: Not on file   Highest education level: 9th grade  Occupational History   Not on file  Tobacco Use   Smoking status: Former    Current packs/day: 0.00    Types: Cigarettes    Quit date: 06/20/1986    Years since quitting: 37.6   Smokeless tobacco: Never  Vaping Use   Vaping status: Never Used  Substance and Sexual Activity   Alcohol use: Yes    Comment: History of heavy use but only social at present.  10/29/15 - a beer every week or so   Drug use: No    Comment: Prior cocaine use but none since 2000   Sexual activity: Not Currently  Other Topics Concern   Not on file  Social History Narrative   He is widowed for 13 years, and has 2 daughters: Hadassah Louetta Marla Andrez & [address] [place], and one son [place] Festa. the other history is daughter who is here with him today.    He works for [employer] and [employer] as a [employer].   He quit smoking in 1988. He does drink alcohol socially, but he does have a history of heavy use in the past. He also has a prior history of cocaine use but none since 2000.   Social Drivers of Corporate investment banker Strain: Low Risk  (02/10/2024)   Overall Financial Resource Strain (CARDIA)    Difficulty of Paying Living Expenses: Not very hard  Food Insecurity: No Food Insecurity (02/10/2024)   Hunger Vital Sign    Worried About Running Out of Food in the Last Year: Never true    Ran Out of Food in the Last Year: Never  true  Transportation Needs: No Transportation Needs (02/10/2024)   PRAPARE - Administrator, Civil Service (Medical): No    Lack of Transportation (Non-Medical): No  Physical Activity: Insufficiently Active (02/10/2024)   Exercise Vital Sign    Days of Exercise per Week: 1 day    Minutes of Exercise per Session: 20 min  Stress: No Stress Concern Present (02/10/2024)   [employer] of Occupational Health - Occupational Stress Questionnaire    Feeling of Stress: Only a little  Social Connections: Moderately Integrated (02/10/2024)   Social Connection and Isolation Panel    Frequency of Communication with Friends and Family: More than three times a week    Frequency of Social Gatherings with Friends and Family: Three times a week    Attends Religious Services: More than 4 times per year    Active Member of Clubs or Organizations: Yes    Attends Banker Meetings: More than 4 times per year    Marital Status: Widowed  Intimate Partner Violence: Not on file    Review of Systems 13 point review of systems per patient health survey noted.  Negative other than as indicated above or in HPI.    Objective:   Vitals:   02/10/24 1521  BP: 122/60  Pulse: 85  Temp: 98.6 F (37 C)  TempSrc: Temporal  SpO2: 96%  Weight: 263 lb 3.2 oz (119.4 kg)  Height: 5' 6.25 (1.683 m)  Physical Exam Vitals reviewed.  Constitutional:      Appearance: He is well-developed. He is obese.  HENT:     Head: Normocephalic and atraumatic.     Right Ear: External ear normal.     Left Ear: External ear normal.  Eyes:     Extraocular Movements:     Right eye: Normal extraocular motion.     Left eye: Normal extraocular motion.     Conjunctiva/sclera: Conjunctivae normal.     Pupils: Pupils are equal, round, and reactive to light.  Neck:     Thyroid : No thyromegaly.  Cardiovascular:     Rate and Rhythm: Normal rate and regular rhythm.     Heart sounds: Normal heart sounds.   Pulmonary:     Effort: Pulmonary effort is normal. No respiratory distress.     Breath sounds: Normal breath sounds. No wheezing.  Abdominal:     General: There is no distension.     Palpations: Abdomen is soft.     Tenderness: There is no abdominal tenderness.  Musculoskeletal:        General: No tenderness. Normal range of motion.     Cervical back: Normal range of motion and neck supple.  Lymphadenopathy:     Cervical: No cervical adenopathy.  Skin:    General: Skin is warm and dry.  Neurological:     Mental Status: He is alert and oriented to person, place, and time.     Deep Tendon Reflexes: Reflexes are normal and symmetric.  Psychiatric:        Behavior: Behavior normal.        Assessment & Plan:  Nicholas Potts is a 63 y.o. male . Annual physical exam  - -anticipatory guidance as below in AVS, screening labs above. Health maintenance items as above in HPI discussed/recommended as applicable.   Hyperlipidemia, unspecified hyperlipidemia type - Plan: Lipid panel, Comprehensive metabolic panel with GFR, atorvastatin  (LIPITOR) 10 MG tablet, DISCONTINUED: atorvastatin  (LIPITOR) 10 MG tablet  - Restart statin, check labs as above, likely will be elevated off meds.   Need for influenza vaccination - Plan: Flu vaccine trivalent PF, 6mos and older(Flulaval,Afluria,Fluarix,Fluzone)  Need for shingles vaccine - Plan: Varicella-zoster vaccine IM  Diplopia - Plan: Ambulatory referral to Neurology Dizziness - Plan: Ambulatory referral to Neurology  - New concern as above, fleeting symptoms, primarily in the morning.  Nonfocal neuroexam.  Will refer to neurology to decide if further testing, eval indicated.  RTC/ER precautions of acute changes.  Prediabetes - Plan: Hemoglobin A1c  - Continue to watch diet, activity, check labs and adjust plan accordingly.  Screening for prostate cancer - Plan: PSA Urinary urgency - Plan: PSA, POCT urinalysis dipstick  - Check initial labs  as above and follow-up to discuss further and to decide if urology eval needed.  RTC precautions if acute changes.  Meds ordered this encounter  Medications   atorvastatin  (LIPITOR) 10 MG tablet    Sig: Take 1 tablet (10 mg total) by mouth at bedtime.    Dispense:  30 tablet    Refill:  0   Patient Instructions  Restart atorvastatin  once per day for cholesterol.  I will check labs today but I expect that level will be elevated off medication.  I will refer you to neurology for the dizziness and double vision in the mornings.  If any new symptoms be seen.  Keep follow-up with other specialist as planned.  I will check urine test, prostate test today, follow-up in the  next few weeks and we can discuss the symptoms further and decide if a follow-up with urology will be needed.  Return to the clinic or go to the nearest emergency room if any of your symptoms worsen or new symptoms occur.  Cuidados preventivos en los hombres de 40 a 64 aos de edad Preventive Care 47-79 Years Old, Male Los cuidados preventivos hacen referencia a las opciones en cuanto al estilo de vida y a las visitas al mdico, las cuales pueden promover la salud y Counsellor. Las visitas de cuidado preventivo tambin se denominan exmenes de Health visitor. Qu puedo esperar para mi visita de cuidado preventivo? Asesoramiento Durante la visita de cuidado preventivo, el mdico puede preguntarle sobre lo siguiente: Antecedentes mdicos, incluidos los siguientes: Problemas mdicos pasados. Antecedentes mdicos familiares. Salud actual, incluido lo siguiente: Su bienestar emocional. Training and development officer y las relaciones personales. Su actividad sexual. Estilo de vida, incluido lo siguiente: Consumo de alcohol, nicotina, tabaco o drogas. Acceso a armas de fuego. Hbitos de alimentacin, ejercicio y sueo. Cuestiones de seguridad, como el uso de cinturn de seguridad y casco de bicicleta. Uso de pantalla solar. Su trabajo y  [place] laboral. Examen fsico El mdico revisar lo siguiente: Diplomatic Services operational officer y [place]. Estos pueden usarse para calcular el IMC (ndice de masa corporal). El [facility] es una medicin que indica si tiene un peso saludable. Circunferencia de la cintura. Es ignacia medicin alrededor de Lobbyist. Esta medicin tambin indica si tiene un peso saludable y puede ayudar a predecir su riesgo de padecer ciertas enfermedades, como diabetes tipo 2 y presin arterial alta. Frecuencia cardaca y presin arterial. Temperatura corporal. Piel para detectar manchas anormales. Qu vacunas necesito?  Las vacunas se aplican a varias edades, segn un cronograma. El [employer] recomendar vacunas segn su edad, sus antecedentes mdicos, su estilo de vida y [address], como los viajes o el lugar donde trabaja. Qu pruebas necesito? Pruebas de deteccin El mdico puede recomendar pruebas de deteccin de ciertas afecciones. Esto puede incluir: Niveles de lpidos y colesterol. Pruebas de deteccin de la diabetes. Esto se Physiological scientist un control del azcar en la sangre (glucosa) despus de no haber comido durante un periodo de tiempo (ayuno). Prueba de hepatitis B. Prueba de hepatitis C. Prueba del VIH (virus de inmunodeficiencia humana). Pruebas de infecciones de transmisin sexual (ITS), si est en riesgo. Pruebas de deteccin de cncer de pulmn. Examen de deteccin del cncer de prstata. Pruebas de deteccin de Building services engineer. Hable con su mdico sobre los [employer], las opciones de tratamiento y, si corresponde, la necesidad de Education officer, environmental ms pruebas. Siga estas instrucciones en su casa: Comida y bebida  Siga una dieta que incluya frutas y verduras frescas, cereales integrales, protenas magras y productos lcteos descremados. Tome los suplementos vitamnicos y [employer] se lo haya indicado el mdico. No beba alcohol si el mdico se lo prohbe. Si bebe alcohol: Limite la cantidad que  consume de 0 a 2 medidas por da. Sepa cunta cantidad de alcohol hay en las bebidas que toma. En los [address], una medida equivale a una botella de cerveza de 12 oz (355 ml), un vaso de vino de 5 oz (148 ml) o un vaso de una bebida alcohlica de alta graduacin de 1 oz (44 ml). Estilo de [employer] dientes a la maana y a la noche con Conservator, museum/gallery con fluoruro. Use hilo dental una vez al da. Haga al menos 30 minutos de ejercicio, 5 o ms  das [employer]. No consuma ningn producto que contenga nicotina o tabaco. Estos productos incluyen cigarrillos, tabaco para Theatre manager y aparatos de vapeo, como los cigarrillos electrnicos. Si necesita ayuda para dejar de fumar, consulte al mdico. No consuma drogas. Si es sexualmente activo, practique sexo seguro. Use un condn u otra forma de proteccin para prevenir las infecciones de transmisin sexual (ITS). Tome aspirina nicamente como se lo haya indicado el mdico. Asegrese de que comprende qu cantidad y cul presentacin debe tomar. Trabaje con el mdico para averiguar si es seguro y beneficioso para usted tomar aspirina a diario. Busque maneras saludables de Charity fundraiser, tales como: Meditacin, yoga o escuchar msica. Lleve un diario personal. Hable con una persona confiable. Pase tiempo con amigos y familiares. Minimice la exposicin a la radiacin UV para reducir el riesgo de cncer de piel. Seguridad U[place]  siempre el cinturn de seguridad al conducir o viajar en un vehculo. No conduzca: Si ha estado bebiendo alcohol. No viaje con un conductor que ha estado bebiendo. Si est cansado o distrado. Mientras est enviando mensajes de texto. Si ha estado usando sustancias o drogas que alteran la funcin mental. Use un casco y otros equipos de proteccin durante las actividades deportivas. Si tiene armas de fuego en su casa, asegrese de seguir todos los procedimientos de seguridad correspondientes. Cundo volver? Acuda al  mdico una vez al ao para una visita anual de control de bienestar. Pregntele al mdico con qu frecuencia debe realizarse un control de la vista y los dientes. Mantenga su esquema de vacunacin al da. Esta informacin no tiene Theme park manager el consejo del mdico. Asegrese de hacerle al mdico cualquier pregunta que tenga. Document Revised: 12/12/2020 Document Reviewed: 12/12/2020 Elsevier Patient Education  2024 Elsevier Inc.    Signed,   Reyes Pines, MD [facility], [facility] 02/10/24 4:03 PM

## 2024-02-11 LAB — COMPREHENSIVE METABOLIC PANEL WITH GFR
ALT: 22 U/L (ref 0–53)
AST: 21 U/L (ref 0–37)
Albumin: 4.3 g/dL (ref 3.5–5.2)
Alkaline Phosphatase: 99 U/L (ref 39–117)
BUN: 15 mg/dL (ref 6–23)
CO2: 29 meq/L (ref 19–32)
Calcium: 8.7 mg/dL (ref 8.4–10.5)
Chloride: 103 meq/L (ref 96–112)
Creatinine, Ser: 0.97 mg/dL (ref 0.40–1.50)
GFR: 82.38 mL/min (ref >60.00)
Glucose, Bld: 89 mg/dL (ref 70–99)
Potassium: 4.3 meq/L (ref 3.5–5.1)
Sodium: 140 meq/L (ref 135–145)
Total Bilirubin: 0.6 mg/dL (ref 0.2–1.2)
Total Protein: 7.2 g/dL (ref 6.0–8.3)

## 2024-02-11 LAB — LIPID PANEL
Cholesterol: 213 mg/dL — ABNORMAL HIGH (ref 0–200)
HDL: 39.3 mg/dL (ref >39.00)
LDL Cholesterol: 123 mg/dL — ABNORMAL HIGH (ref 0–99)
NonHDL: 174.1
Total CHOL/HDL Ratio: 5
Triglycerides: 257 mg/dL — ABNORMAL HIGH (ref 0.0–149.0)
VLDL: 51.4 mg/dL — ABNORMAL HIGH (ref 0.0–40.0)

## 2024-02-11 LAB — PSA: PSA: 0.83 ng/mL (ref 0.10–4.00)

## 2024-02-11 LAB — HEMOGLOBIN A1C: Hgb A1c MFr Bld: 6.6 % — ABNORMAL HIGH (ref 4.6–6.5)

## 2024-02-15 ENCOUNTER — Encounter: Payer: Self-pay | Admitting: Family Medicine

## 2024-02-15 ENCOUNTER — Ambulatory Visit: Payer: Self-pay | Admitting: Family Medicine

## 2024-02-21 NOTE — Progress Notes (Signed)
 Called patient with interpreter. Unable to leave vm to return call.

## 2024-03-03 ENCOUNTER — Ambulatory Visit: Admitting: Family Medicine

## 2024-03-03 ENCOUNTER — Encounter: Payer: Self-pay | Admitting: Family Medicine

## 2024-03-03 ENCOUNTER — Other Ambulatory Visit: Payer: Self-pay

## 2024-03-03 ENCOUNTER — Ambulatory Visit (AMBULATORY_SURGERY_CENTER)

## 2024-03-03 VITALS — Ht 66.0 in | Wt 266.0 lb

## 2024-03-03 VITALS — BP 124/64 | HR 77 | Temp 98.4°F | Resp 16 | Ht 66.25 in | Wt 266.2 lb

## 2024-03-03 DIAGNOSIS — R350 Frequency of micturition: Secondary | ICD-10-CM

## 2024-03-03 DIAGNOSIS — M79605 Pain in left leg: Secondary | ICD-10-CM

## 2024-03-03 DIAGNOSIS — M79604 Pain in right leg: Secondary | ICD-10-CM

## 2024-03-03 DIAGNOSIS — E785 Hyperlipidemia, unspecified: Secondary | ICD-10-CM

## 2024-03-03 DIAGNOSIS — Z860100 Personal history of colon polyps, unspecified: Secondary | ICD-10-CM

## 2024-03-03 MED ORDER — MIRABEGRON ER 25 MG PO TB24: 25.0000 mg | ORAL_TABLET | Freq: Every day | ORAL | 0 refills | Status: DC

## 2024-03-03 MED ORDER — TAMSULOSIN HCL 0.4 MG PO CAPS: 0.4000 mg | ORAL_CAPSULE | Freq: Every day | ORAL | 3 refills | Status: DC

## 2024-03-03 MED ORDER — NA SULFATE-K SULFATE-MG SULF 17.5-3.13-1.6 GM/177ML PO SOLN: 1.0000 | Freq: Once | ORAL | 0 refills | Status: AC

## 2024-03-03 NOTE — Progress Notes (Signed)
 Subjective:  Patient ID: Nicholas Potts, male    DOB: January 13, 1961  Age: 63 y.o. MRN: [id number]  CC:  Chief Complaint  Patient presents with   Urinary Frequency    2 week follow up. Reports sx are the same.     HPI Nicholas Potts presents for follow up - here with spanish interpreter.   Urinary frequency Discussed at his September 4th physical.  PSA and urinalysis looked okay at that time.He did have some ketones in his urine but no nitrite or LE.  Glucose was normal with A1c 6.6.  Still having frequent urination, slight burning at times - noted at start of urination. No penile discharge. No hematuria. If he does not go right away, then difficult to start urination, few drops initially then flows ok. No fever.   Referred to urology last year - similar symptoms. Was started on med that helped. Off meds for awhile, did ok then return of same symptoms. Myrbetriq .   Leg pain, foot pain: Noticed 4 days ago - wearing same shoes, but feet and legs both sides started to hurt. Shoes felt tighter. No rash. Some itching. Sharp pain. Feels similar.  No fall/injury, has been riding bike, no change inactivity or exercise.  No new back pain. Prior back issues, with prior fracture.  Restarted statin after last visit - back on meds past few weeks.   At end of visit, notify me that his back specialist will no longer be following, still taking tramadol  few times per week for work.  Plans on discussing further at his next visit for plan moving forward.   History Patient Active Problem List   Diagnosis Date Noted   Dizziness 12/05/2023   Orthopnea 12/05/2023   Nocturnal hypoxemia 01/15/2023   Injury of thoracic spine (HCC) 10/22/2022   Dorsal (thoracic) vertebral fracture (HCC) 10/16/2022   Injury 10/15/2022   Urinary urgency 07/10/2022   Urinary frequency 07/10/2022   Morbid obesity with BMI of 40.0-44.9, adult (HCC) 09/10/2021   Fatigue 09/10/2021   History of colonic polyps    Polyp  of ascending colon    Polyp of transverse colon    Polyp of cecum    Polyp of descending colon    Polyp of sigmoid colon    Hypersomnia 11/29/2017   Other headache syndrome 05/05/2017   Vertigo 05/05/2017   IFG (impaired fasting glucose) 08/21/2016   Obesity (BMI 30-39.9) 06/23/2013   Stress-induced cardiomyopathy -- essentially resolved    Cerebral infarction (HCC)    OSA (obstructive sleep apnea)    Hyperlipidemia with target LDL less than 100    Physical deconditioning 01/24/2013   Tinea pedis 01/18/2013   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   ARF (acute renal failure) with tubular necrosis requiring HD 01/11/2013   Anemia 01/11/2013   Thrombocytopenia, unspecified 01/11/2013   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 12/29/2012   Past Medical History:  Diagnosis Date   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HCC) 2014   post op    Allergy 2014   Odeine   ARF (acute respiratory failure) (HCC) 2014   Blood dyscrasia    Chronic kidney disease 2014   ARF    Complication of anesthesia 2014   - post op - in combination of pain pills-  Cardiac and Respiratory Arrest- prior to sleep apnea diagonosis   Constipation    CVA (cerebral infarction)    Hyperlipidemia    Hypertension    Nocturnal hypoxemia 01/15/2023  OSA (obstructive sleep apnea)    Pre-diabetes    Short-term memory loss    Sleep apnea    Stress-induced cardiomyopathy -- RESOLVED    Post-op Narcotic induced Acute Hypoxic respiratory failure with respiratory and PEA cardiac arrest following accidental narcotic overdose; initial US  20-20%, now improved to 55%   Was supported with Impella Cardiac Cath - non-obstructive CAD ; 1 month after event - EF up from 25% to 55-60% - stable x 5 yrs   Stroke [facility]) 2014   Thrombocytopenia 2014   Past Surgical History:  Procedure Laterality Date   ACHILLES TENDON REPAIR     BACK SURGERY  10/15/2023   placed 8 screws   BIOPSY   08/01/2019   Procedure: BIOPSY;  Surgeon: Shila Gustav GAILS, MD;  Location: [facility];  Service: Endoscopy;;   BIOPSY  08/05/2020   Procedure: BIOPSY;  Surgeon: Shila Gustav GAILS, MD;  Location: [facility];  Service: Endoscopy;;   CARDIAC CATHETERIZATION  12/29/2012   RN LHC --> Impella. for severe shock with low EF. Mild to moderate RV pressure elevation. Normal coronary arteries. Cardiac Output 2.5, Index 1.46.   COLONOSCOPY WITH PROPOFOL  N/A 10/30/2015   Procedure: COLONOSCOPY WITH PROPOFOL ;  Surgeon: Gustav Shila GAILS, MD;  Location: [facility];  Service: Endoscopy;  Laterality: N/A;   COLONOSCOPY WITH PROPOFOL  N/A 08/01/2019   Procedure: COLONOSCOPY WITH PROPOFOL ;  Surgeon: Shila Gustav GAILS, MD;  Location: [facility];  Service: Endoscopy;  Laterality: N/A;   COLONOSCOPY WITH PROPOFOL  N/A 08/05/2020   Procedure: COLONOSCOPY WITH PROPOFOL ;  Surgeon: Shila Gustav GAILS, MD;  Location: [facility];  Service: Endoscopy;  Laterality: N/A;   CORONARY ARTERY BYPASS GRAFT  2014   INSERTION OF DIALYSIS CATHETER Right 01/09/2013   Procedure: INSERTION OF DIALYSIS CATHETER;  Surgeon: Lonni GORMAN Blade, MD;  Location: [facility] OR;  Service: Vascular;  Laterality: Right;   LAMINECTOMY WITH POSTERIOR LATERAL ARTHRODESIS LEVEL 3 N/A 10/15/2022   Procedure: THORACIC NINE-THORACIC TWELVE POSTERIOR INSTRUMENTED FUSION REDUCTION OF FRACTURE;  Surgeon: Debby Dorn MATSU, MD;  Location: [facility] OR;  Service: Neurosurgery;  Laterality: N/A;   LEFT AND RIGHT HEART CATHETERIZATION WITH CORONARY ANGIOGRAM  12/29/2012   Procedure: LEFT AND RIGHT HEART CATHETERIZATION WITH CORONARY ANGIOGRAM;  Surgeon: Ozell Fell, MD;  Location: [facility] CATH LAB;  Service: Cardiovascular;;   POLYPECTOMY  08/01/2019   Procedure: POLYPECTOMY;  Surgeon: Shila Gustav GAILS, MD;  Location: [facility];  Service: Endoscopy;;   POLYPECTOMY  08/05/2020   Procedure: POLYPECTOMY;  Surgeon: Shila Gustav GAILS, MD;  Location: [facility];  Service: Endoscopy;;   TRANSTHORACIC ECHOCARDIOGRAM  12/29/2012   EF 25% with periapical HK/AKA.  --> Followup echo 7/25 -- EF of 35%   TRANSTHORACIC ECHOCARDIOGRAM  01/2013 ; 07/2015   a) EF 55-60%. No regional WMA. Grade 1 diastolic dysfunction. Mild LA dilatation.; b) Normal LV size with mild LV hypertrophy. EF 55-60%. Normal RV   TRANSTHORACIC ECHOCARDIOGRAM  03/2018   EF 55-60%.  NO RWMA. Normal valves   Allergies  Allergen Reactions   Codeine    Oxycontin  [Oxycodone ] Other (See Comments)    Oxygen levels drop   Prior to Admission medications   Medication Sig Start Date End Date Taking? Authorizing Provider  acetaminophen  (TYLENOL ) 325 MG tablet Take 1-2 tablets (325-650 mg total) by mouth every 4 (four) hours as needed for mild pain. 10/30/22  Yes Setzer, Nena PARAS, PA-C  atorvastatin  (LIPITOR) 10 MG tablet Take 1 tablet (10 mg total) by mouth at bedtime. 02/10/24  Yes Levora Reyes SAUNDERS, MD  traMADol  (ULTRAM ) 50 MG tablet Take 1-2 tablets (50-100 mg total) by mouth every 6 (six) hours. 11/05/22  Yes Levora Reyes SAUNDERS, MD  modafinil  (PROVIGIL ) 200 MG tablet 1-2 tabs daily as needed for sleepiness Patient not taking: Reported on 03/03/2024 12/16/23   Neysa Reggy BIRCH, MD   Social History   Socioeconomic History   Marital status: Widowed    Spouse name: Not on file   Number of children: Not on file   Years of education: Not on file   Highest education level: 9th grade  Occupational History   Not on file  Tobacco Use   Smoking status: Former    Current packs/day: 0.00    Types: Cigarettes    Quit date: 06/20/1986    Years since quitting: 37.7   Smokeless tobacco: Never  Vaping Use   Vaping status: Never Used  Substance and Sexual Activity   Alcohol use: Yes    Comment: History of heavy use but only social at present.  10/29/15 - a beer every week or so   Drug use: No    Comment: Prior cocaine use but none since 2000   Sexual activity: Not Currently  Other Topics  Concern   Not on file  Social History Narrative   He is widowed for 13 years, and has 2 daughters: Hadassah Louetta Marla Andrez & [id number] [place], and one son Ichael Pullara. the other history is daughter who is here with him today.    He works for [employer] and [employer] as a [employer].   He quit smoking in 1988. He does drink alcohol socially, but he does have a history of heavy use in the past. He also has a prior history of cocaine use but none since 2000.   Social Drivers of Corporate investment banker Strain: Low Risk  (02/10/2024)   Overall Financial Resource Strain (CARDIA)    Difficulty of Paying Living Expenses: Not very hard  Food Insecurity: No Food Insecurity (02/10/2024)   Hunger Vital Sign    Worried About Running Out of Food in the Last Year: Never true    Ran Out of Food in the Last Year: Never true  Transportation Needs: No Transportation Needs (02/10/2024)   PRAPARE - Administrator, Civil Service (Medical): No    Lack of Transportation (Non-Medical): No  Physical Activity: Insufficiently Active (02/10/2024)   Exercise Vital Sign    Days of Exercise per Week: 1 day    Minutes of Exercise per Session: 20 min  Stress: No Stress Concern Present (02/10/2024)   [employer] of Occupational Health - Occupational Stress Questionnaire    Feeling of Stress: Only a little  Social Connections: Moderately Integrated (02/10/2024)   Social Connection and Isolation Panel    Frequency of Communication with Friends and Family: More than three times a week    Frequency of Social Gatherings with Friends and Family: Three times a week    Attends Religious Services: More than 4 times per year    Active Member of Clubs or Organizations: Yes    Attends Banker Meetings: More than 4 times per year    Marital Status: Widowed  Intimate Partner Violence: Not on file    Review of Systems   Objective:   Vitals:   03/03/24 1113  BP: 124/64  Pulse: 77   Resp: 16  Temp: 98.4 F (36.9 C)  TempSrc: Temporal  SpO2: 96%  Weight: 266 lb 3.2 oz (120.7 kg)  Height: 5' 6.25 (1.683 m)     Physical Exam Vitals reviewed.  Constitutional:      Appearance: He is well-developed.  HENT:     Head: Normocephalic and atraumatic.  Neck:     Vascular: No carotid bruit or JVD.  Cardiovascular:     Rate and Rhythm: Normal rate and regular rhythm.     Heart sounds: Normal heart sounds. No murmur heard. Pulmonary:     Effort: Pulmonary effort is normal.     Breath sounds: Normal breath sounds. No rales.  Abdominal:     General: There is no distension.     Tenderness: There is no right CVA tenderness or left CVA tenderness.  Musculoskeletal:     Right lower leg: Edema (Trace lower extremity edema ankles.  No stasis changes.) present.     Left lower leg: Edema present.     Comments: Slight discomfort diffusely along lower anterior legs, and some of the areas of edema.  No focal bony tenderness.  Calves nontender, negative Homans.  Neurovascular intact distally, no skin erythema or wounds.  Skin:    General: Skin is warm and dry.  Neurological:     Mental Status: He is alert and oriented to person, place, and time.  Psychiatric:        Mood and Affect: Mood normal.        Assessment & Plan:  Nicholas Potts is a 63 y.o. male . Urinary frequency - Plan: Ambulatory referral to Urology, mirabegron  ER (MYRBETRIQ ) 25 MG TB24 tablet, DISCONTINUED: tamsulosin  (FLOMAX ) 0.4 MG CAPS capsule, CANCELED: Ambulatory referral to Urology  - No apparent sign of infection but question BPH with LUTS, prior evaluation and treatment with urology.  Will have him follow back up with urology but in the interim can try Myrbetriq  as he reported some improvement with that medicine previously from urology.  RTC precautions given.  Initially he was unaware of previous medication, reason for initial prescription for tamsulosin  but this was discontinued and advised him to  use Myrbetriq  once we realized that was a medicine that he had used effectively from urology.  Bilateral leg pain - Plan: CK Hyperlipidemia, unspecified hyperlipidemia type - Plan: CK  - Check CK, option of trial off statin temporarily for a week or so and if symptoms improve he will let me know as we can decide on intermittent statin or picked up the medication.  If no change off statin, restart and will follow-up to discuss other possible causes with RTC precautions given.  Lumbar source less likely at this time.  Meds ordered this encounter  Medications   DISCONTD: tamsulosin  (FLOMAX ) 0.4 MG CAPS capsule    Sig: Take 1 capsule (0.4 mg total) by mouth daily.    Dispense:  30 capsule    Refill:  3   mirabegron  ER (MYRBETRIQ ) 25 MG TB24 tablet    Sig: Take 1 tablet (25 mg total) by mouth daily.    Dispense:  30 tablet    Refill:  0   Patient Instructions  For the frequent urination I will refer you back to urology but we can try a medication called tamsulosin  once per day for now.  If you have lightheadedness dizziness or new side effects without medication stop it and let me know.  If you do not hear from the urologist in the next 2 weeks let me know.  Since the leg pain just started recently, you can try  coming off of the cholesterol medication for 1 week.  If leg pain is resolved, let me know and we can check into other options for medicine or change timing of medication.  If no change in leg pains then we can look at other causes.  If any acute worsening or new symptoms be seen.  Either way we can follow-up in 2 weeks and discuss leg pains and plan for follow-up of your back pains and treatment moving forward.      Signed,   Reyes Pines, MD [facility], [facility] 03/03/24 12:21 PM

## 2024-03-03 NOTE — Patient Instructions (Addendum)
 For the frequent urination I will refer you back to urology but we can try a medication called tamsulosin  once per day for now.  If you have lightheadedness dizziness or new side effects without medication stop it and let me know.  If you do not hear from the urologist in the next 2 weeks let me know.  Since the leg pain just started recently, you can try coming off of the cholesterol medication for 1 week.  If leg pain is resolved, let me know and we can check into other options for medicine or change timing of medication.  If no change in leg pains then we can look at other causes.  If any acute worsening or new symptoms be seen.  Either way we can follow-up in 2 weeks and discuss leg pains and plan for follow-up of your back pains and treatment moving forward.

## 2024-03-03 NOTE — Progress Notes (Signed)
 Denies allergies to eggs or soy products. Denies complication of anesthesia or sedation. Denies use of weight loss medication. Denies use of O2.   Emmi instructions given for colonoscopy.   Pre-Visit was conducted with [facility] interpreting as well as the patients daughter. Instructions were written in Spanish. Patient verbalizes understanding of instructions.

## 2024-03-05 ENCOUNTER — Encounter: Payer: Self-pay | Admitting: Family Medicine

## 2024-03-06 ENCOUNTER — Encounter: Payer: Self-pay | Admitting: Gastroenterology

## 2024-03-14 ENCOUNTER — Telehealth: Payer: Self-pay | Admitting: Gastroenterology

## 2024-03-14 NOTE — Telephone Encounter (Signed)
 Inbound call from patients daughter stating that patient is scheduled to have a colonoscopy on 10/10 with Dr. Shila, she states that he had a lot of vegetables that were not cooked yesterday. She is requesting a call back to discuss if he can proceed with procedure. Please advise.

## 2024-03-14 NOTE — Telephone Encounter (Signed)
 RN returned call to patient's daughter about her father having eaten a lot of (raw) vegetables yesterday. RN advised patient daughter that her father should not eat any more raw vegetables or the other foods listed in the patient instructions, which RN reviewed in detail.  RN also instructed that patient should drink lots of water to help flush out the vegetables and also avoid all solid food on his clear liquid day and the morning before his procedure. Instructions were also provided about trying to drink a full glass of clear liquids every hour on the clear liquid day, referring to the chart in the prep instructions. RN also stated that patient must drink the water after his prep medication in order for the prep medication to work effectively.  Patient daughter stated understanding of all instructions and said she would pay attention to the details of the instructions in the prep instructions.

## 2024-03-17 ENCOUNTER — Ambulatory Visit (AMBULATORY_SURGERY_CENTER): Admitting: Gastroenterology

## 2024-03-17 ENCOUNTER — Encounter: Payer: Self-pay | Admitting: Gastroenterology

## 2024-03-17 VITALS — BP 100/60 | HR 66 | Temp 98.4°F | Resp 10 | Ht 66.0 in | Wt 266.0 lb

## 2024-03-17 DIAGNOSIS — D123 Benign neoplasm of transverse colon: Secondary | ICD-10-CM | POA: Diagnosis not present

## 2024-03-17 DIAGNOSIS — D124 Benign neoplasm of descending colon: Secondary | ICD-10-CM | POA: Diagnosis not present

## 2024-03-17 DIAGNOSIS — K5730 Diverticulosis of large intestine without perforation or abscess without bleeding: Secondary | ICD-10-CM

## 2024-03-17 DIAGNOSIS — K648 Other hemorrhoids: Secondary | ICD-10-CM | POA: Diagnosis not present

## 2024-03-17 DIAGNOSIS — D120 Benign neoplasm of cecum: Secondary | ICD-10-CM

## 2024-03-17 DIAGNOSIS — Z1211 Encounter for screening for malignant neoplasm of colon: Secondary | ICD-10-CM | POA: Diagnosis present

## 2024-03-17 DIAGNOSIS — Z860101 Personal history of adenomatous and serrated colon polyps: Secondary | ICD-10-CM

## 2024-03-17 DIAGNOSIS — K621 Rectal polyp: Secondary | ICD-10-CM | POA: Diagnosis not present

## 2024-03-17 DIAGNOSIS — K6389 Other specified diseases of intestine: Secondary | ICD-10-CM

## 2024-03-17 DIAGNOSIS — K644 Residual hemorrhoidal skin tags: Secondary | ICD-10-CM

## 2024-03-17 DIAGNOSIS — Z860100 Personal history of colon polyps, unspecified: Secondary | ICD-10-CM

## 2024-03-17 DIAGNOSIS — D128 Benign neoplasm of rectum: Secondary | ICD-10-CM

## 2024-03-17 DIAGNOSIS — D122 Benign neoplasm of ascending colon: Secondary | ICD-10-CM

## 2024-03-17 DIAGNOSIS — D125 Benign neoplasm of sigmoid colon: Secondary | ICD-10-CM | POA: Diagnosis not present

## 2024-03-17 MED ORDER — SODIUM CHLORIDE 0.9 % IV SOLN: 500.0000 mL | Freq: Once | INTRAVENOUS | Status: DC

## 2024-03-17 NOTE — Progress Notes (Signed)
 [facility] History and Physical   Primary Care Physician:  Levora Reyes SAUNDERS, MD   Reason for Procedure:  History of adenomatous colon polyps  Plan:    Surveillance colonoscopy with possible interventions as needed     HPI: Nicholas Potts is a very pleasant 63 y.o. male here for surveillance colonoscopy. Denies any nausea, vomiting, abdominal pain, melena or bright red blood per rectum  The risks and benefits as well as alternatives of endoscopic procedure(s) have been discussed and reviewed. All questions answered. The patient agrees to proceed.    Past Medical History:  Diagnosis Date   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HCC) 2014   post op    Allergy 2014   Odeine   ARF (acute respiratory failure) (HCC) 2014   Blood dyscrasia    Chronic kidney disease 2014   ARF    Complication of anesthesia 2014   - post op - in combination of pain pills-  Cardiac and Respiratory Arrest- prior to sleep apnea diagonosis   Constipation    CVA (cerebral infarction)    Hyperlipidemia    Hypertension    Myocardial infarction (HCC)    Nocturnal hypoxemia 01/15/2023   OSA (obstructive sleep apnea)    Pre-diabetes    Short-term memory loss    Sleep apnea    Stress-induced cardiomyopathy -- RESOLVED    Post-op Narcotic induced Acute Hypoxic respiratory failure with respiratory and PEA cardiac arrest following accidental narcotic overdose; initial US  20-20%, now improved to 55%   Was supported with Impella Cardiac Cath - non-obstructive CAD ; 1 month after event - EF up from 25% to 55-60% - stable x 5 yrs   Stroke [facility]) 2014   Thrombocytopenia 2014    Past Surgical History:  Procedure Laterality Date   ACHILLES TENDON REPAIR     BACK SURGERY  10/15/2023   placed 8 screws   BIOPSY  08/01/2019   Procedure: BIOPSY;  Surgeon: Shila Gustav GAILS, MD;  Location: [facility];  Service: Endoscopy;;   BIOPSY  08/05/2020   Procedure: BIOPSY;  Surgeon: Shila Gustav GAILS, MD;   Location: [facility];  Service: Endoscopy;;   CARDIAC CATHETERIZATION  12/29/2012   RN LHC --> Impella. for severe shock with low EF. Mild to moderate RV pressure elevation. Normal coronary arteries. Cardiac Output 2.5, Index 1.46.   COLONOSCOPY WITH PROPOFOL  N/A 10/30/2015   Procedure: COLONOSCOPY WITH PROPOFOL ;  Surgeon: Gustav Shila GAILS, MD;  Location: [facility];  Service: Endoscopy;  Laterality: N/A;   COLONOSCOPY WITH PROPOFOL  N/A 08/01/2019   Procedure: COLONOSCOPY WITH PROPOFOL ;  Surgeon: Shila Gustav GAILS, MD;  Location: [facility];  Service: Endoscopy;  Laterality: N/A;   COLONOSCOPY WITH PROPOFOL  N/A 08/05/2020   Procedure: COLONOSCOPY WITH PROPOFOL ;  Surgeon: Shila Gustav GAILS, MD;  Location: [facility];  Service: Endoscopy;  Laterality: N/A;   CORONARY ARTERY BYPASS GRAFT  2014   INSERTION OF DIALYSIS CATHETER Right 01/09/2013   Procedure: INSERTION OF DIALYSIS CATHETER;  Surgeon: Lonni GORMAN Blade, MD;  Location: [facility] OR;  Service: Vascular;  Laterality: Right;   KNEE SURGERY Left    LAMINECTOMY WITH POSTERIOR LATERAL ARTHRODESIS LEVEL 3 N/A 10/15/2022   Procedure: THORACIC NINE-THORACIC TWELVE POSTERIOR INSTRUMENTED FUSION REDUCTION OF FRACTURE;  Surgeon: Debby Dorn MATSU, MD;  Location: [facility] OR;  Service: Neurosurgery;  Laterality: N/A;   LEFT AND RIGHT HEART CATHETERIZATION WITH CORONARY ANGIOGRAM  12/29/2012   Procedure: LEFT AND RIGHT HEART CATHETERIZATION WITH CORONARY ANGIOGRAM;  Surgeon: Ozell Fell, MD;  Location: [facility]  CATH LAB;  Service: Cardiovascular;;   POLYPECTOMY  08/01/2019   Procedure: POLYPECTOMY;  Surgeon: Shila Gustav GAILS, MD;  Location: [facility];  Service: Endoscopy;;   POLYPECTOMY  08/05/2020   Procedure: POLYPECTOMY;  Surgeon: Shila Gustav GAILS, MD;  Location: [facility];  Service: Endoscopy;;   TRANSTHORACIC ECHOCARDIOGRAM  12/29/2012   EF 25% with periapical HK/AKA.  --> Followup echo 7/25 -- EF of 35%   TRANSTHORACIC  ECHOCARDIOGRAM  01/2013 ; 07/2015   a) EF 55-60%. No regional WMA. Grade 1 diastolic dysfunction. Mild LA dilatation.; b) Normal LV size with mild LV hypertrophy. EF 55-60%. Normal RV   TRANSTHORACIC ECHOCARDIOGRAM  03/2018   EF 55-60%.  NO RWMA. Normal valves    Prior to Admission medications   Medication Sig Start Date End Date Taking? Authorizing Provider  acetaminophen  (TYLENOL ) 325 MG tablet Take 1-2 tablets (325-650 mg total) by mouth every 4 (four) hours as needed for mild pain. 10/30/22  Yes Setzer, Sandra J, PA-C  traMADol  (ULTRAM ) 50 MG tablet Take 1-2 tablets (50-100 mg total) by mouth every 6 (six) hours. 11/05/22  Yes Levora Reyes SAUNDERS, MD  atorvastatin  (LIPITOR) 10 MG tablet Take 1 tablet (10 mg total) by mouth at bedtime. 02/10/24   Levora Reyes SAUNDERS, MD  mirabegron  ER (MYRBETRIQ ) 25 MG TB24 tablet Take 1 tablet (25 mg total) by mouth daily. 03/03/24   Levora Reyes SAUNDERS, MD  modafinil  (PROVIGIL ) 200 MG tablet 1-2 tabs daily as needed for sleepiness 12/16/23   Neysa Rama D, MD    Current Outpatient Medications  Medication Sig Dispense Refill   acetaminophen  (TYLENOL ) 325 MG tablet Take 1-2 tablets (325-650 mg total) by mouth every 4 (four) hours as needed for mild pain.     traMADol  (ULTRAM ) 50 MG tablet Take 1-2 tablets (50-100 mg total) by mouth every 6 (six) hours. 32 tablet 0   atorvastatin  (LIPITOR) 10 MG tablet Take 1 tablet (10 mg total) by mouth at bedtime. 90 tablet 1   mirabegron  ER (MYRBETRIQ ) 25 MG TB24 tablet Take 1 tablet (25 mg total) by mouth daily. 30 tablet 0   modafinil  (PROVIGIL ) 200 MG tablet 1-2 tabs daily as needed for sleepiness 60 tablet 5   Current Facility-Administered Medications  Medication Dose Route Frequency Provider Last Rate Last Admin   0.9 %  sodium chloride  infusion  500 mL Intravenous Once Kuron Docken V, MD        Allergies as of 03/17/2024 - Review Complete 03/17/2024  Allergen Reaction Noted   Codeine Other (See Comments)  11/11/2022   Oxycontin  [oxycodone ] Other (See Comments) 10/02/2015    Family History  Problem Relation Age of Onset   Cancer Mother        Liver cancer   Diabetes Mother    Obesity Mother    Cancer Father        Leukemia   Alcohol abuse Father    Obesity Father    Cancer Maternal Grandmother        Liver cancer   Obesity Maternal Grandmother    Cancer Paternal Grandmother        Colon cancer   Colon cancer Neg Hx    Esophageal cancer Neg Hx    Stomach cancer Neg Hx    Rectal cancer Neg Hx     Social History   Socioeconomic History   Marital status: Widowed    Spouse name: Not on file   Number of children: Not on file   Years of education:  Not on file   Highest education level: 9th grade  Occupational History   Not on file  Tobacco Use   Smoking status: Former    Current packs/day: 0.00    Types: Cigarettes    Quit date: 06/20/1986    Years since quitting: 37.7   Smokeless tobacco: Never  Vaping Use   Vaping status: Never Used  Substance and Sexual Activity   Alcohol use: Yes    Comment: History of heavy use but only social at present.  10/29/15 - a beer every week or so   Drug use: No    Comment: Prior cocaine use but none since 2000   Sexual activity: Not Currently  Other Topics Concern   Not on file  Social History Narrative   He is widowed for 13 years, and has 2 daughters: Hadassah Louetta Marla Andrez & [address] [place], and one son Trentyn Boisclair. the other history is daughter who is here with him today.    He works for [employer] and [employer] as a [employer].   He quit smoking in 1988. He does drink alcohol socially, but he does have a history of heavy use in the past. He also has a prior history of cocaine use but none since 2000.   Social Drivers of Corporate investment banker Strain: Low Risk  (02/10/2024)   Overall Financial Resource Strain (CARDIA)    Difficulty of Paying Living Expenses: Not very hard  Food Insecurity: No Food  Insecurity (02/10/2024)   Hunger Vital Sign    Worried About Running Out of Food in the Last Year: Never true    Ran Out of Food in the Last Year: Never true  Transportation Needs: No Transportation Needs (02/10/2024)   PRAPARE - Administrator, Civil Service (Medical): No    Lack of Transportation (Non-Medical): No  Physical Activity: Insufficiently Active (02/10/2024)   Exercise Vital Sign    Days of Exercise per Week: 1 day    Minutes of Exercise per Session: 20 min  Stress: No Stress Concern Present (02/10/2024)   [employer] of Occupational Health - Occupational Stress Questionnaire    Feeling of Stress: Only a little  Social Connections: Moderately Integrated (02/10/2024)   Social Connection and Isolation Panel    Frequency of Communication with Friends and Family: More than three times a week    Frequency of Social Gatherings with Friends and Family: Three times a week    Attends Religious Services: More than 4 times per year    Active Member of Clubs or Organizations: Yes    Attends Banker Meetings: More than 4 times per year    Marital Status: Widowed  Intimate Partner Violence: Not on file    Review of Systems:  All other review of systems negative except as mentioned in the HPI.  Physical Exam: Vital signs in last 24 hours: BP 138/86   Pulse 68   Temp 98.4 F (36.9 C) (Temporal)   Ht 5' 6 (1.676 m)   Wt 266 lb (120.7 kg)   SpO2 99%   BMI 42.93 kg/m  General:   Alert, NAD Lungs:  Clear .   Heart:  Regular rate and rhythm Abdomen:  Soft, nontender and nondistended. Neuro/Psych:  Alert and cooperative. Normal mood and affect. A and O x 3  Reviewed labs, radiology imaging, old records and pertinent past GI work up  Patient is appropriate for planned procedure(s) and anesthesia in an ambulatory  setting   K. Veena Nance Mccombs , MD [phone]

## 2024-03-17 NOTE — Progress Notes (Signed)
 Pt's states no medical or surgical changes since previsit or office visit.

## 2024-03-17 NOTE — Op Note (Signed)
 [name] Procedure Date: 03/17/2024 9:57 AM MRN: [id number] Endoscopist: Gustav ALONSO Mcgee , MD, [id number] Age: 63 Referring MD:  Date of Birth: 13-Dec-1960 Gender: Male Account #: [id number] Procedure:                Colonoscopy Indications:              High risk colon cancer surveillance: Personal                            history of adenoma (10 mm or greater in size), High                            risk colon cancer surveillance: Personal history of                            multiple (3 or more) adenomas Medicines:                Monitored Anesthesia Care Procedure:                Pre-Anesthesia Assessment:                           - Prior to the procedure, a History and Physical                            was performed, and patient medications and                            allergies were reviewed. The patient's tolerance of                            previous anesthesia was also reviewed. The risks                            and benefits of the procedure and the sedation                            options and risks were discussed with the patient.                            All questions were answered, and informed consent                            was obtained. Prior Anticoagulants: The patient has                            taken no anticoagulant or antiplatelet agents. ASA                            Grade Assessment: III - A patient with severe                            systemic disease. After reviewing the risks and  benefits, the patient was deemed in satisfactory                            condition to undergo the procedure.                           After obtaining informed consent, the colonoscope                            was passed under direct vision. Throughout the                            procedure, the patient's blood pressure, pulse, and                            oxygen  saturations were monitored continuously. The                            [id number]-HQ190L Colonoscope [id number] was introduced                            through the anus and advanced to the the cecum,                            identified by appendiceal orifice and ileocecal                            valve. The colonoscopy was performed without                            difficulty. The patient tolerated the procedure                            well. The quality of the bowel preparation was                            adequate. The ileocecal valve, appendiceal orifice,                            and rectum were photographed. Scope In: 10:13:05 AM Scope Out: 10:35:24 AM Scope Withdrawal Time: 0 hours 17 minutes 34 seconds  Total Procedure Duration: 0 hours 22 minutes 19 seconds  Findings:                 The perianal and digital rectal examinations were                            normal.                           A diffuse area of mild melanosis was found in the                            entire colon.  Ten sessile polyps were found in the rectum,                            sigmoid colon, descending colon, transverse colon,                            ascending colon and cecum. The polyps were 3 to 8                            mm in size. These polyps were removed with a cold                            snare. Resection and retrieval were complete.                           Scattered small-mouthed diverticula were found in                            the sigmoid colon and descending colon.                           Non-bleeding external and internal hemorrhoids were                            found during retroflexion. The hemorrhoids were                            medium-sized. Complications:            No immediate complications. Estimated Blood Loss:     Estimated blood loss was minimal. Impression:               - Melanosis in the colon.                           - Ten  3 to 8 mm polyps in the rectum, in the                            sigmoid colon, in the descending colon, in the                            transverse colon, in the ascending colon and in the                            cecum, removed with a cold snare. Resected and                            retrieved.                           - Diverticulosis in the sigmoid colon and in the                            descending colon.                           -  Non-bleeding external and internal hemorrhoids. Recommendation:           - Resume previous diet.                           - Continue present medications.                           - Await pathology results.                           - Repeat colonoscopy in 1 year for surveillance                            based on pathology results. Nicholas Potts V. Georgeann Brinkman, MD 03/17/2024 10:45:31 AM This report has been signed electronically.

## 2024-03-17 NOTE — Patient Instructions (Addendum)
 -Informacion sobre polipos, hemorroides y diverticulosis brindada -Espere por los [employer] de patologia  USTED TUVO UN PROCEDIMIENTO ENDOSCPICO HOY EN EL [facility]:   Lea el informe del procedimiento que se le entreg para cualquier pregunta especfica sobre lo que se Dentist.  Si el informe del examen no responde a sus preguntas, por favor llame a su gastroenterlogo para aclararlo.  Si usted solicit que no se le den [employer] de lo que se Clinical cytogeneticist en su procedimiento al [employer] va a cuidar, entonces el informe del procedimiento se ha incluido en un sobre sellado para que usted lo revise despus cuando le sea ms conveniente.   LO QUE PUEDE ESPERAR: Algunas sensaciones de hinchazn en el abdomen.  Puede tener ms gases de lo normal.  El caminar puede ayudarle a eliminar el aire que se le puso en el tracto gastrointestinal durante el procedimiento y reducir la hinchazn.  Si le hicieron una endoscopia inferior (como una colonoscopia o una sigmoidoscopia flexible), podra notar manchas de sangre en las heces fecales o en el papel higinico.  Si se someti a una preparacin intestinal para su procedimiento, es posible que no tenga una evacuacin intestinal normal durante [employer].   Tenga en cuenta:  Es posible que note un poco de irritacin y congestin en la nariz o algn drenaje.  Esto es debido al oxgeno [employer] durante su procedimiento.  No hay que preocuparse y esto debe desaparecer ms o Regulatory affairs officer.   SNTOMAS PARA REPORTAR INMEDIATAMENTE:  Despus de una endoscopia inferior (colonoscopia o sigmoidoscopia flexible):  Cantidades excesivas de sangre en las heces fecales  Sensibilidad significativa o empeoramiento de los dolores abdominales   Hinchazn aguda del abdomen que antes no tena   Fiebre de 100F o ms   Para asuntos urgentes o de Associate Professor, puede comunicarse con un gastroenterlogo a cualquier hora llamando al [phone].  DIETA:  Recomendamos una comida pequea al principio, pero luego puede continuar con su dieta normal.  Tome muchos lquidos, pero debe evitar las bebidas alcohlicas durante 24 horas.    ACTIVIDAD:  Debe planear tomarse las cosas con calma por el resto del da y no debe CONDUCIR ni usar maquinaria pesada Patent examiner (debido a los medicamentos de sedacin utilizados durante el examen).     SEGUIMIENTO: Nuestro personal llamar al nmero que aparece en su historial al siguiente da hbil de su procedimiento para ver cmo se siente y para responder cualquier pregunta o inquietud que pueda tener con respecto a la informacin que se le dio despus del procedimiento. Si no podemos contactarle, le dejaremos un mensaje.  Sin embargo, si se siente bien y no tiene English as a second language teacher, no es necesario que nos devuelva la llamada.  Asumiremos que ha regresado a sus actividades diarias normales sin incidentes. Si se le tomaron algunas biopsias, le contactaremos por telfono o por carta en las prximas 3 semanas.  Si no ha sabido [employer] biopsias en el transcurso de 3 semanas, por favor llmenos al [phone].   FIRMAS/CONFIDENCIALIDAD: Usted y/o el acompaante que le cuide han firmado documentos que se ingresarn en su historial mdico electrnico.  Estas firmas atestiguan el hecho de que la informacin anterior   Crecimiento tisular en el colon (plipos en el colon): qu hay que saber Tissue Growths in the Colon (Colon Polyps): What to Know  Estos plipos son un crecimiento de tejido dentro del colon, que es una parte del intestino grueso.  Un plipo puede ser un bulto redondo o un crecimiento con forma de seta. Usted podra tener un plipo o ms de uno. La mayora de los plipos en el colon son benignos. Sin embargo, algunos de ellos pueden volverse malignos con el tiempo. Encontrar y extirpar los plipos con anticipacin es una forma de Engineer, manufacturing systems. Cules son las causas? No se  conoce la causa exacta de los plipos en el colon. Qu incrementa el riesgo? Algunos factores pueden volverlo ms propenso a tener plipos en el colon. Estos incluyen: Tener antecedentes familiares de cncer colorrectal o de plipos en el colon. Ser mayor de [address]. Ser menor de 45 aos y Warehouse manager cualquiera de estas dos enfermedades: Tener antecedentes familiares importantes de cncer colorrectal o de plipos en el colon. Enfermedad gentica que aumenta el riesgo de padecer plipos en el colon. Tener una enfermedad inflamatoria del intestino, como colitis ulcerosa o enfermedad de Crohn. Padecer ciertas afecciones que se transmiten de padres a hijos, como las siguientes: Poliposis adenomatosa familiar (PAF). Sndrome de Personnel officer. Sndrome de [place]. Sndrome de Peutz-Jeghers. Poliposis asociada a MUTYH (MAP). Ciertos hbitos como: Fumar cigarrillos o beber demasiado alcohol. No hacer suficiente actividad fsica. Tener sobrepeso. Seguir una dieta rica en grasas y carnes rojas, pero escasa en [place]. Haber tenido cncer de nio, el cual se trat con radiacin en el abdomen. Cules son los signos o sntomas? En muchos casos, no hay sntomas. Si tiene sntomas, estos pueden incluir los siguientes: Sangre procedente del recto al defecar. Sangre en las heces. La sangre puede ser de color rojo brillante o de color muy oscuro. Dolor en el vientre. Cambio en los hbitos intestinales, como problemas para defecar (estreimiento) o diarrea. Cmo se diagnostica? Los plipos en el colon se diagnostican mediante una colonoscopa. En este procedimiento, se introduce un colonoscopio flexible con luz propia en la abertura entre las nalgas para que llegue al colon y poder examinar la zona. A veces, los plipos se encuentran cuando se realiza una colonoscopa como parte de un examen de rutina para Engineer, site. Cmo se trata? El Forensic psychologist en extirpar los plipos encontrados. En su mayora,  los plipos pueden extirparse durante una colonoscopa. Estos plipos, luego, se analizan para Landscape architect presencia de cncer. Puede ser necesario un tratamiento adicional segn los [employer]. Siga las siguientes instrucciones en su hogar: Comida y bebida  Consuma alimentos con alto contenido de fibra, como frutas, verduras y Radiation protection practitioner. Consuma alimentos con alto contenido de calcio y vitamina D. Algunas opciones incluyen [place], [place], [place], huevos, hgado, pescado y [place]. Limite el consumo de alimentos ricos en grasa, como los alimentos fritos y los postres. Limite la cantidad de carne roja, carne precocinada o carne curada, u otro tipo de carne procesada. Algunos ejemplos son los perros calientes, las salchichas, el tocino y los panes de carne. Limite las bebidas azucaradas. Estilo de vida Mantenga un peso saludable. Baje de peso si lo recomienda el mdico. Haga actividad fsica todos los das o como se lo haya indicado el mdico. No  fume, vapee, ni consuma nicotina ni tabaco. No  beba alcohol si: Su mdico le indica no hacerlo. Est embarazada, puede estar embarazada o planea quedar embarazada. Si bebe alcohol: Limite la cantidad que consume: Entre 0 y 1 medida al da si es [place]. Entre 0 y 2 medidas al da si es varn. Sepa cunta cantidad de alcohol tienen las bebidas que toma. En los [address], burkina faso medida es una botella  de cerveza de 12 OZ (355 ML), un vaso de vino de 5 OZ (148 ML) o un vaso de una bebida alcohlica de alta graduacin de 1 OZ (44 ML). Instrucciones generales [employer] solo segn lo indicado. Concurra a todas las visitas de seguimiento. Esto incluye realizarse colonoscopas peridicas. Hable con el mdico para saber cundo debe realizarse una colonoscopa. Comunquese con un mdico si: Presenta otro sangrado o sangra ms cuando defeca. Las heces presentan sangre o aument la cantidad en ellas. Hubo un cambio en los  hbitos intestinales, como estreimiento o diarrea. Pierde peso sin ningn motivo conocido. Vomita o siente ganas de vomitar. Solicite ayuda de inmediato si: Tiene dolor muy intenso en el abdomen. Esta informacin no tiene Theme park manager el consejo del mdico. Asegrese de hacerle al mdico cualquier pregunta que tenga. Document Revised: 06/15/2023 Document Reviewed: 06/15/2023 Elsevier Patient Education  2025 [employer].  Hemorroides Hemorrhoids Las hemorroides son venas hinchadas en el recto o la zona que lo rodea, o en la abertura de las nalgas (ano). Hay dos tipos de hemorroides: Internas. Se forman en las venas del interior del recto. Pueden abultarse hacia afuera, irritarse y doler. Externas. Estas se producen en las venas que estn fuera del ano. Pueden sentirse como una hinchazn o un bulto duro dolorosos cerca del ano. La mayora de las hemorroides no causan problemas graves. A menudo, pueden tratarse en casa con cambios en la dieta y el estilo de vida. Si los tratamientos caseros no ayudan, quizs necesite un procedimiento para reducir o extirpar las hemorroides. Cules son las causas? Las hemorroides son causadas por la presin cerca del ano. Esta presin puede deberse a lo siguiente: Estreimiento o diarrea. Hace esfuerzo para eliminar heces. Embarazo. Obesidad. Estar sentado o andar en bicicleta durante mucho tiempo. Levantar objetos pesados u otras cosas que impliquen esfuerzo. Sexo anal. Cules son los signos o sntomas? Los sntomas de esta afeccin incluyen: Engineer, mining. Picazn o irritacin anal. Sangrado que proviene del recto. Fuga de materia fecal (heces). Hinchazn del ano. Uno o ms bultos alrededor del ano. Cmo se diagnostica? Las hemorroides se diagnostican frecuentemente a travs de un examen visual. Posiblemente le realicen otros tipos de pruebas o estudios, como los siguientes: Psychiatrist. Esto lo realiza el mdico mediante tacto rectal  con un dedo enguantado. Anoscopia. Este es un examen del ano que se hace con un tubo pequeo. Anlisis de sangre si ha perdido [employer]. Sigmoidoscopa o colonoscopa. Estos son estudios para observar el interior del colon a travs de un tubo que tiene una cmara en el extremo. Cmo se trata? En la [employer], las hemorroides se pueden tratar en casa con cambios en la dieta y el estilo de vida. Si estos cambios no resultan eficaces, tal vez deba someterse a un procedimiento. Estos procedimientos pueden reducir las hemorroides o extirparlas completamente. Algunos de los procedimientos ms frecuentes son los siguientes: Ligadura con Curator. Las bandas elsticas se colocan en la base de las hemorroides para interrumpir su irrigacin de [place]. Escleroterapia. Se pone un medicamento dentro de las hemorroides para reducir su tamao. Coagulacin con luz infrarroja. Se utiliza un tipo de energa lumnica para eliminar las hemorroides. Hemorroidectoma. Las hemorroides se extirpan durante la [place]. Luego, las venas que las irrigan se atan para Geologist, engineering. Hemorroidopexia con grapas. La base de las hemorroides se engrapa a la pared del recto. Siga estas instrucciones en su casa: Medicamentos Use los medicamentos de venta libre y los recetados solamente  como se lo haya indicado el mdico. Use cremas medicadas o medicamentos medicinales que se ponen en el recto (supositorios) como se lo haya indicado el mdico. Comida y bebida  Consumir alimentos ricos en fibra, como frijoles, cereales integrales, y frutas y verduras frescas. Pregntele a su mdico acerca de tomar productos con fibra aadida (suplementos de [place]). Disminuya la cantidad de grasa de la dieta. Esto se puede lograr consumiendo productos lcteos con bajo contenido de grasas, ingiriendo menor cantidad de carnes rojas y evitando los alimentos procesados. Beber suficiente lquido para Radio producer pis (orina) de color  amarillo plido. Control del dolor y la hinchazn  Tome baos de asiento tibios durante 20 minutos, 3 a 4 veces por Futures trader. Esto puede ayudar a Glass blower/designer y Environmental health practitioner. Puede hacer esto en una baera o usar un dispositivo porttil para bao de asiento que se coloca sobre el inodoro. Si se lo indican, aplique hielo en la zona afectada. El uso de compresas de hielo entre baos de asiento puede ser de ayuda. Ponga el hielo en una bolsa plstica. Coloque una toalla entre la piel y la bolsa. Aplique el hielo durante 20 minutos, 2 a 3 veces por da. Si la piel se le pone de color rojo brillante, retire el hielo de inmediato para evitar daos en la piel. El [place] de dao es mayor si no puede sentir dolor, Airline pilot o fro. Instrucciones generales Actividad fsica. Consulte al mdico qu cantidad y qu tipo de ejercicio es mejor para usted. En general, debe realizar al menos 30 minutos de ejercicio moderado la [employer] de la semana (150 minutos cada semana). Es recomendable que pruebe con caminar, andar en bicicleta o practicar yoga. Vaya al bao cuando sienta ganas de defecar. No espere. Evite hacer esfuerzos para defecar. Mantenga el ano limpio y seco. Use papel higinico hmedo o toallitas humedecidas despus de defecar. No pase mucho tiempo sentado en el inodoro. Esto puede aumentar la afluencia de sangre y Chief Technology Officer. Dnde buscar ms informacin [employer] of Diabetes and Digestive and Kidney Diseases [employer] de la Diabetes y las Enfermedades Digestivas y [place]): [employer].si Comunquese con un mdico si: Tiene ms dolor e hinchazn que no mejoran con Scientist, research (medical). Tiene dificultad para defecar o no puede hacerlo. Siente dolor o tiene inflamacin fuera de la zona de las hemorroides. Solicite ayuda de inmediato si: Tiene sangrado del recto y no puede detenerlo. Esta informacin no tiene Theme park manager el consejo del mdico. Asegrese de hacerle al mdico  cualquier pregunta que tenga. Document Revised: 03/12/2022 Document Reviewed: 03/12/2022 Elsevier Patient Education  2024 Elsevier Inc.  Diverticulosis Diverticulosis  La diverticulosis se produce cuando se forman unas pequeas bolsas llamadas divertculos en la pared del colon. El colon es el lugar donde se absorbe el agua. Tambin es Immunologist donde se forma la materia fecal (heces). Las bolsas se forman cuando la capa interna del colon ejerce presin sobre los puntos dbiles de las capas externas. Puede tener unas pocas bolsas o muchas. En la [employer], estas bolsas no causan problemas. Si se inflaman o infectan, usted puede padecer una enfermedad denominada diverticulitis. Cules son las causas? Se desconoce la causa de esta afeccin. Qu incrementa el riesgo? Es ms probable que tenga esta afeccin si: Es mayor de 60 aos. No consume suficiente fibra o sufre estreimiento con mucha frecuencia. Tiene sobrepeso. No realiza suficiente actividad fsica. Fuma. Toma analgsicos sin receta. Tiene antecedentes familiares de la afeccin.  Cules son los signos o sntomas? La mayora de las personas no tiene sntomas. Si tiene sntomas, pueden incluir los siguientes: Meteorismo. Clicos intestinales. Estreimiento o diarrea. Dolor en la parte inferior izquierda del abdomen. Cmo se diagnostica? Con frecuencia, esta afeccin se diagnostica durante un examen para detectar otros problemas del colon. Puede diagnosticarse cuando se realiza: Una colonoscopa. Este es un estudio en el que se usa  un tubo con una cmara en el extremo para observar el colon. Un enema de bario. Este es un examen radiogrfico en el que se usa  un tinte para observar el colon. Una exploracin por tomografa computarizada (TC). Cmo se trata? Puede que no necesite tratamiento. Su mdico le dir Programmer, systems en casa para Physiological scientist. Es posible que necesite tratamiento si tiene sntomas o si ha  tenido diverticulitis antes. Es posible que le indiquen lo siguiente: [place] alimentos ricos Lobbyist. Tomar medicamentos para relajar el colon. Adelgazar. Siga estas instrucciones en su casa: Medicamentos Use los medicamentos de venta libre y los recetados solamente como se lo haya indicado el mdico. Si se lo indican, tome un suplemento con fibras o probitico. Control del estreimiento Su afeccin puede causar estreimiento. Para prevenir o tratar el estreimiento, es posible que deba hacer lo siguiente: Beber suficiente lquido para Radio producer pis (orina) de color amarillo plido. Usar medicamentos recetados o de Sales promotion account executive. Consumir alimentos ricos en fibra, como frijoles, cereales integrales, y frutas y verduras frescas. Limitar el consumo de alimentos ricos en grasa y azcares procesados, como los alimentos fritos o dulces. Trate de no hacer fuerza cuando defeque. Comunquese con un mdico si: Los sntomas empeoran de [employer] repentina. Tiene dolor abdominal que empeora. Tiene hinchazn o clicos estomacales. Sigue teniendo estreimiento con frecuencia. Tiene fiebre o escalofros. Vomita. Sus heces tienen sangre, aspecto alquitranado o son de color negro. Esta informacin no tiene Theme park manager el consejo del mdico. Asegrese de hacerle al mdico cualquier pregunta que tenga. Document Revised: 04/01/2022 Document Reviewed: 04/01/2022 Elsevier Patient Education  2024 [employer].

## 2024-03-17 NOTE — Progress Notes (Signed)
 Report given to PACU, vss

## 2024-03-20 ENCOUNTER — Telehealth: Payer: Self-pay

## 2024-03-20 NOTE — Telephone Encounter (Signed)
 Follow up call to pt, no answer.

## 2024-03-21 ENCOUNTER — Ambulatory Visit: Payer: Self-pay | Admitting: Gastroenterology

## 2024-03-21 LAB — SURGICAL PATHOLOGY

## 2024-03-23 ENCOUNTER — Encounter: Payer: Self-pay | Admitting: Neurology

## 2024-03-23 ENCOUNTER — Ambulatory Visit: Admitting: Family Medicine

## 2024-03-24 ENCOUNTER — Ambulatory Visit: Admitting: Family Medicine

## 2024-03-24 ENCOUNTER — Encounter: Payer: Self-pay | Admitting: Family Medicine

## 2024-03-24 VITALS — BP 118/60 | HR 83 | Temp 98.7°F | Resp 20 | Ht 66.0 in | Wt 268.6 lb

## 2024-03-24 DIAGNOSIS — M79671 Pain in right foot: Secondary | ICD-10-CM

## 2024-03-24 DIAGNOSIS — S24109D Unspecified injury at unspecified level of thoracic spinal cord, subsequent encounter: Secondary | ICD-10-CM | POA: Diagnosis not present

## 2024-03-24 DIAGNOSIS — S22078A Other fracture of T9-T10 vertebra, initial encounter for closed fracture: Secondary | ICD-10-CM | POA: Diagnosis not present

## 2024-03-24 DIAGNOSIS — R3915 Urgency of urination: Secondary | ICD-10-CM

## 2024-03-24 DIAGNOSIS — M79672 Pain in left foot: Secondary | ICD-10-CM

## 2024-03-24 DIAGNOSIS — M545 Low back pain, unspecified: Secondary | ICD-10-CM

## 2024-03-24 DIAGNOSIS — G8929 Other chronic pain: Secondary | ICD-10-CM

## 2024-03-24 MED ORDER — TRAMADOL HCL 50 MG PO TABS: 50.0000 mg | ORAL_TABLET | Freq: Four times a day (QID) | ORAL | 0 refills | Status: DC

## 2024-03-24 NOTE — Patient Instructions (Addendum)
 I refilled tramadol  if needed for back pain, and will refer you back for physical therapy. Recheck in 2 months - can recheck labs at that time.   I will refer you to podiatrist for the plantar foot pain. No new meds for now.   Take care.   Chronic Back Pain Chronic back pain is back pain that lasts longer than 3 months. The cause of your back pain may not be known. Some common causes include: Wear and tear (degenerative disease) of the bones, disks, or tissues that connect bones to each other (ligaments) in your back. Inflammation and stiffness in your back (arthritis). If you have chronic back pain, you may have times when the pain is more intense (flare-ups). You can also learn to manage the pain with home care. Follow these instructions at home: Watch for any changes in your symptoms. Take these actions to help with your pain: Managing pain and stiffness     If told, put ice on the painful area. You may be told to apply ice for the first 24-48 hours after a flare-up starts. Put ice in a plastic bag. Place a towel between your skin and the bag. Leave the ice on for 20 minutes, 2-3 times per day. If told, apply heat to the affected area as often as told by your health care provider. Use the heat source that your provider recommends, such as a moist heat pack or a heating pad. Place a towel between your skin and the heat source. Leave the heat on for 20-30 minutes. If your skin turns bright red, remove the ice or heat right away to prevent skin damage. The risk of damage is higher if you cannot feel pain, heat, or cold. Try soaking in a warm tub. Activity        Avoid bending and other activities that make the pain worse. Have good posture when you stand or sit. When you stand, keep your upper back and neck straight, with your shoulders pulled back. Avoid slouching. When you sit, keep your back straight. Relax your shoulders. Do not round your shoulders or pull them backward. Do  not sit or stand in one place for too long. Take brief periods of rest during the day. This will reduce your pain. Resting in a lying or standing position is often better than sitting to rest. When you rest for longer periods, mix in some mild activity or stretching between periods of rest. This will help to prevent stiffness and pain. Get regular exercise. Ask your provider what activities are safe for you. You may have to avoid lifting. Ask your provider how much you can safely lift. If you do lift, always use the right technique. This means you should: Bend your knees. Keep the load close to your body. Avoid twisting. Medicines Take over-the-counter and prescription medicines only as told by your provider. You may need to take medicines for pain and inflammation. These may be taken by mouth or put on the skin. You may also be given muscle relaxants. Ask your provider if the medicine prescribed to you: Requires you to avoid driving or using machinery. Can cause constipation. You may need to take these actions to prevent or treat constipation: Drink enough fluid to keep your pee (urine) pale yellow. Take over-the-counter or prescription medicines. Eat foods that are high in fiber, such as beans, whole grains, and fresh fruits and vegetables. Limit foods that are high in fat and processed sugars, such as fried or sweet  foods. General instructions  Sleep on a firm mattress in a comfortable position. Try lying on your side with your knees slightly bent. If you lie on your back, put a pillow under your knees. Do not use any products that contain nicotine or tobacco. These products include cigarettes, chewing tobacco, and vaping devices, such as e-cigarettes. If you need help quitting, ask your provider. Contact a health care provider if: You have pain that does not get better with rest or medicine. You have new pain. You have a fever. You lose weight quickly. You have trouble doing your  normal activities. You feel weak or numb in one or both of your legs or feet. Get help right away if: You are not able to control when you pee or poop. You have severe back pain and: Nausea or vomiting. Pain in your chest or abdomen. Shortness of breath. You faint. These symptoms may be an emergency. Get help right away. Call 911. Do not wait to see if the symptoms will go away. Do not drive yourself to the hospital. This information is not intended to replace advice given to you by your health care provider. Make sure you discuss any questions you have with your health care provider. Document Revised: 01/12/2022 Document Reviewed: 01/12/2022 Elsevier Patient Education  2024 [employer].

## 2024-03-24 NOTE — Progress Notes (Signed)
 Subjective:  Patient ID: Nicholas Potts, male    DOB: [birth year]  Age: 63 y.o. MRN: [id number]  CC:  Chief Complaint  Patient presents with   Back Pain    Follow up. No change. Would like to focus more on back pain than his leg pain   Leg Pain    Follow up. Gotten better. Legs start to hurt at 2pm and later. He thinks its from standing all day. He was without a job for a year and now that he has a job, he doesn't think his body is used to standing.     HPI Nicholas Potts presents for follow up. Spouse translating.   Back/leg pain Follow-up from September 26 visit.  Prior remote thoracic vertebral fracture.  Previous PSA and urinalysis was reassuring in early September when discussing urinary frequency.  Had seen urology previously with similar symptoms and started on Myrbetriq  that assisted with frequent urination.  He denied any new back pain as of his last visit but had been taking tramadol  few times per week when working for some of his chronic back pain, and prior back specialist was no longer following.  He had also noted having some lower leg pains.  Had recently started back on his statin for a few weeks at that time.   Initially recommended stopping his statin for 1 week to see if the leg symptoms improved. No change with stopping statin - 5 days. Back on statin now.  No further upper leg pains. Only on bottom of feet. Denies burning pain, sharp pain and with walking or prolonged standing.   Urinary frequency has improved with use of myrbetriq  -  no side effects. Specialist appt next month.   Back pain is stable - tramadol  once each morning during work. 5 days per week - back to work since May. 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 Controlled substance database (PDMP) reviewed. No concerns appreciated. #40 tramadol  last filled 7/27. Ran out of meds.  Denies side effects, risks discussed, and constipation management discussed. No recent PT.  Helped in past.   Lab Results  Component Value Date   HGBA1C 6.6 (H) 02/10/2024  Working on diet for prior elevated A1c.   History Patient Active Problem List   Diagnosis Date Noted   Dizziness 12/05/2023   Orthopnea 12/05/2023   Nocturnal hypoxemia 01/15/2023   Injury of thoracic spine (HCC) 10/22/2022   Dorsal (thoracic) vertebral fracture (HCC) 10/16/2022   Injury 10/15/2022   Urinary urgency 07/10/2022   Urinary frequency 07/10/2022   Morbid obesity with BMI of 40.0-44.9, adult (HCC) 09/10/2021   Fatigue 09/10/2021   History of colonic polyps    Polyp of ascending colon    Polyp of transverse colon    Polyp of cecum    Polyp of descending colon    Polyp of sigmoid colon    Hypersomnia 11/29/2017   Other headache syndrome 05/05/2017   Vertigo 05/05/2017   IFG (impaired fasting glucose) 08/21/2016   Obesity (BMI 30-39.9) 06/23/2013   Stress-induced cardiomyopathy -- essentially resolved    Cerebral infarction (HCC)    OSA (obstructive sleep apnea)    Hyperlipidemia with target LDL less than 100    Physical deconditioning 01/24/2013   Tinea pedis 01/18/2013   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   ARF (acute renal failure) with tubular necrosis requiring HD 01/11/2013   Anemia 01/11/2013   Thrombocytopenia, unspecified 01/11/2013   History of Cardiac and respiratory arrest  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 narcotic overdose 12/29/2012   Past Medical History:  Diagnosis Date   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HCC) 2014   post op    Allergy 2014   Odeine   ARF (acute respiratory failure) (HCC) 2014   Blood dyscrasia    Chronic kidney disease 2014   ARF    Complication of anesthesia 2014   - post op - in combination of pain pills-  Cardiac and Respiratory Arrest- prior to sleep apnea diagonosis   Constipation    CVA (cerebral infarction)    Hyperlipidemia    Hypertension    Myocardial infarction (HCC)    Nocturnal hypoxemia 01/15/2023   OSA (obstructive sleep  apnea)    Pre-diabetes    Short-term memory loss    Sleep apnea    Stress-induced cardiomyopathy -- RESOLVED    Post-op Narcotic induced Acute Hypoxic respiratory failure with respiratory and PEA cardiac arrest following accidental narcotic overdose; initial US  20-20%, now improved to 55%   Was supported with Impella Cardiac Cath - non-obstructive CAD ; 1 month after event - EF up from 25% to 55-60% - stable x 5 yrs   Stroke [facility]) 2014   Thrombocytopenia 2014   Past Surgical History:  Procedure Laterality Date   ACHILLES TENDON REPAIR     BACK SURGERY  10/15/2023   placed 8 screws   BIOPSY  08/01/2019   Procedure: BIOPSY;  Surgeon: Shila Gustav GAILS, MD;  Location: [facility];  Service: Endoscopy;;   BIOPSY  08/05/2020   Procedure: BIOPSY;  Surgeon: Shila Gustav GAILS, MD;  Location: [facility];  Service: Endoscopy;;   CARDIAC CATHETERIZATION  12/29/2012   RN LHC --> Impella. for severe shock with low EF. Mild to moderate RV pressure elevation. Normal coronary arteries. Cardiac Output 2.5, Index 1.46.   COLONOSCOPY WITH PROPOFOL  N/A 10/30/2015   Procedure: COLONOSCOPY WITH PROPOFOL ;  Surgeon: Gustav Shila GAILS, MD;  Location: [facility];  Service: Endoscopy;  Laterality: N/A;   COLONOSCOPY WITH PROPOFOL  N/A 08/01/2019   Procedure: COLONOSCOPY WITH PROPOFOL ;  Surgeon: Shila Gustav GAILS, MD;  Location: [facility];  Service: Endoscopy;  Laterality: N/A;   COLONOSCOPY WITH PROPOFOL  N/A 08/05/2020   Procedure: COLONOSCOPY WITH PROPOFOL ;  Surgeon: Shila Gustav GAILS, MD;  Location: [facility];  Service: Endoscopy;  Laterality: N/A;   CORONARY ARTERY BYPASS GRAFT  2014   INSERTION OF DIALYSIS CATHETER Right 01/09/2013   Procedure: INSERTION OF DIALYSIS CATHETER;  Surgeon: Lonni GORMAN Blade, MD;  Location: [facility] OR;  Service: Vascular;  Laterality: Right;   KNEE SURGERY Left    LAMINECTOMY WITH POSTERIOR LATERAL ARTHRODESIS LEVEL 3 N/A 10/15/2022   Procedure: THORACIC  NINE-THORACIC TWELVE POSTERIOR INSTRUMENTED FUSION REDUCTION OF FRACTURE;  Surgeon: Debby Dorn MATSU, MD;  Location[facility] OR;  Service: Neurosurgery;  Laterality: N/A;   LEFT AND RIGHT HEART CATHETERIZATION WITH CORONARY ANGIOGRAM  12/29/2012   Procedure: LEFT AND RIGHT HEART CATHETERIZATION WITH CORONARY ANGIOGRAM;  Surgeon: Ozell Fell, MD;  Location[facility] CATH LAB;  Service: Cardiovascular;;   POLYPECTOMY  08/01/2019   Procedure: POLYPECTOMY;  Surgeon: Shila Gustav GAILS, MD;  Location: [facility];  Service: Endoscopy;;   POLYPECTOMY  08/05/2020   Procedure: POLYPECTOMY;  Surgeon: Shila Gustav GAILS, MD;  Location: [facility];  Service: Endoscopy;;   TRANSTHORACIC ECHOCARDIOGRAM  12/29/2012   EF 25% with periapical HK/AKA.  --> Followup echo 7/25 -- EF of 35%   TRANSTHORACIC ECHOCARDIOGRAM  01/2013 ; 07/2015   a) EF 55-60%. No regional WMA.  Grade 1 diastolic dysfunction. Mild LA dilatation.; b) Normal LV size with mild LV hypertrophy. EF 55-60%. Normal RV   TRANSTHORACIC ECHOCARDIOGRAM  03/2018   EF 55-60%.  NO RWMA. Normal valves   Allergies  Allergen Reactions   Codeine Other (See Comments)    Oxygen levels drop   Oxycontin  [Oxycodone ] Other (See Comments)    Oxygen levels drop   Prior to Admission medications   Medication Sig Start Date End Date Taking? Authorizing Provider  acetaminophen  (TYLENOL ) 325 MG tablet Take 1-2 tablets (325-650 mg total) by mouth every 4 (four) hours as needed for mild pain. 10/30/22  Yes Setzer, Nena PARAS, PA-C  atorvastatin  (LIPITOR) 10 MG tablet Take 1 tablet (10 mg total) by mouth at bedtime. 02/10/24  Yes Levora Reyes SAUNDERS, MD  mirabegron  ER (MYRBETRIQ ) 25 MG TB24 tablet Take 1 tablet (25 mg total) by mouth daily. 03/03/24  Yes Levora Reyes SAUNDERS, MD  modafinil  (PROVIGIL ) 200 MG tablet 1-2 tabs daily as needed for sleepiness 12/16/23  Yes Young, Reggy D, MD  traMADol  (ULTRAM ) 50 MG tablet Take 1-2 tablets (50-100 mg total) by mouth every 6 (six)  hours. Patient not taking: Reported on 03/24/2024 11/05/22   Levora Reyes SAUNDERS, MD   Social History   Socioeconomic History   Marital status: Widowed    Spouse name: Not on file   Number of children: Not on file   Years of education: Not on file   Highest education level: 9th grade  Occupational History   Not on file  Tobacco Use   Smoking status: Former    Current packs/day: 0.00    Types: Cigarettes    Quit date: 06/20/1986    Years since quitting: 37.7   Smokeless tobacco: Never  Vaping Use   Vaping status: Never Used  Substance and Sexual Activity   Alcohol use: Yes    Comment: History of heavy use but only social at present.  10/29/15 - a beer every week or so   Drug use: No    Comment: Prior cocaine use but none since 2000   Sexual activity: Not Currently  Other Topics Concern   Not on file  Social History Narrative   He is widowed for 13 years, and has 2 daughters: Hadassah Louetta Marla Andrez & [address] Martin, and one son Amato Sevillano. the other history is daughter who is here with him today.    He works for [employer] and [employer] as a [employer].   He quit smoking in 1988. He does drink alcohol socially, but he does have a history of heavy use in the past. He also has a prior history of cocaine use but none since 2000.   Social Drivers of Corporate investment banker Strain: Low Risk  (02/10/2024)   Overall Financial Resource Strain (CARDIA)    Difficulty of Paying Living Expenses: Not very hard  Food Insecurity: No Food Insecurity (02/10/2024)   Hunger Vital Sign    Worried About Running Out of Food in the Last Year: Never true    Ran Out of Food in the Last Year: Never true  Transportation Needs: No Transportation Needs (02/10/2024)   PRAPARE - Administrator, Civil Service (Medical): No    Lack of Transportation (Non-Medical): No  Physical Activity: Insufficiently Active (02/10/2024)   Exercise Vital Sign    Days of Exercise per Week: 1  day    Minutes of Exercise per Session: 20 min  Stress: No  Stress Concern Present (02/10/2024)   [employer] of Occupational Health - Occupational Stress Questionnaire    Feeling of Stress: Only a little  Social Connections: Moderately Integrated (02/10/2024)   Social Connection and Isolation Panel    Frequency of Communication with Friends and Family: More than three times a week    Frequency of Social Gatherings with Friends and Family: Three times a week    Attends Religious Services: More than 4 times per year    Active Member of Clubs or Organizations: Yes    Attends Banker Meetings: More than 4 times per year    Marital Status: Widowed  Intimate Partner Violence: Not on file    Review of Systems   Objective:   Vitals:   03/24/24 1142  BP: 118/60  Pulse: 83  Resp: 20  Temp: 98.7 F (37.1 C)  TempSrc: Temporal  SpO2: 97%  Weight: 268 lb 9.6 oz (121.8 kg)  Height: 5' 6 (1.676 m)     Physical Exam Vitals reviewed.  Constitutional:      Appearance: He is well-developed. He is obese.  HENT:     Head: Normocephalic and atraumatic.  Neck:     Vascular: No carotid bruit or JVD.  Cardiovascular:     Rate and Rhythm: Normal rate and regular rhythm.     Heart sounds: Normal heart sounds. No murmur heard. Pulmonary:     Effort: Pulmonary effort is normal.     Breath sounds: Normal breath sounds. No rales.  Musculoskeletal:     Right lower leg: No edema.     Left lower leg: No edema.     Comments: No midline bony tenderness of the lower lumbar spine, locates area of discomfort of paraspinals.  Negative seated straight leg raise, no radiation of pain to the legs.  Bilateral foot exam: Diffuse plantar discomfort without rash or focal soft tissue swelling, erythema, or ecchymosis.  Navicular, fifth metatarsal, malleoli of ankle are nontender and no focal bony tenderness.  Negative lateral squeeze of metatarsals as well as calcaneal squeeze.  Sensation  intact.  Pes planus bilaterally.  Skin:    General: Skin is warm and dry.  Neurological:     Mental Status: He is alert and oriented to person, place, and time.  Psychiatric:        Mood and Affect: Mood normal.      Assessment & Plan:  Boleslaw Borghi is a 63 y.o. male . Chronic bilateral low back pain without sciatica - Plan: Ambulatory referral to Physical Therapy Other closed fracture of ninth thoracic vertebra, initial encounter (HCC) - Plan: traMADol  (ULTRAM ) 50 MG tablet Injury of thoracic spine, subsequent encounter (HCC) - Plan: traMADol  (ULTRAM ) 50 MG tablet  - Chronic back pain with surgery in 2024 as above.  Plan for follow-up with me moving forward for treatment including his tramadol  which has been effective during his workweek with 1 dose in the morning.  I do think that repeat physical therapy may be helpful to assess for his recurrent low back pain, upper back pain which may be in response to his previous thoracic injury and surgery.  Tramadol  refilled, potential side effects and risks discussed, PT ordered.  Recheck 2 months  Bilateral foot pain - Plan: Ambulatory referral to Podiatry   - Pes planus bilaterally, not typical symptoms of neuropathy given pain with walking, standing, not at night or at rest.  Not typical dysesthesia/burning neuropathic pain.  May have a component of metatarsalgia, plantar fasciitis,  but given diffuse areas of pain we will have him see podiatry initially, decide on treatment including possible orthotics if those may be helpful.  10-month follow-up.  Recheck labs at that time including his A1c. Urinary urgency   Meds ordered this encounter  Medications   traMADol  (ULTRAM ) 50 MG tablet    Sig: Take 1-2 tablets (50-100 mg total) by mouth every 6 (six) hours.    Dispense:  30 tablet    Refill:  0   Patient Instructions  I refilled tramadol  if needed for back pain, and will refer you back for physical therapy. Recheck in 2 months - can  recheck labs at that time.   I will refer you to podiatrist for the plantar foot pain. No new meds for now.   Take care.   Chronic Back Pain Chronic back pain is back pain that lasts longer than 3 months. The cause of your back pain may not be known. Some common causes include: Wear and tear (degenerative disease) of the bones, disks, or tissues that connect bones to each other (ligaments) in your back. Inflammation and stiffness in your back (arthritis). If you have chronic back pain, you may have times when the pain is more intense (flare-ups). You can also learn to manage the pain with home care. Follow these instructions at home: Watch for any changes in your symptoms. Take these actions to help with your pain: Managing pain and stiffness     If told, put ice on the painful area. You may be told to apply ice for the first 24-48 hours after a flare-up starts. Put ice in a plastic bag. Place a towel between your skin and the bag. Leave the ice on for 20 minutes, 2-3 times per day. If told, apply heat to the affected area as often as told by your health care provider. Use the heat source that your provider recommends, such as a moist heat pack or a heating pad. Place a towel between your skin and the heat source. Leave the heat on for 20-30 minutes. If your skin turns bright red, remove the ice or heat right away to prevent skin damage. The risk of damage is higher if you cannot feel pain, heat, or cold. Try soaking in a warm tub. Activity        Avoid bending and other activities that make the pain worse. Have good posture when you stand or sit. When you stand, keep your upper back and neck straight, with your shoulders pulled back. Avoid slouching. When you sit, keep your back straight. Relax your shoulders. Do not round your shoulders or pull them backward. Do not sit or stand in one place for too long. Take brief periods of rest during the day. This will reduce your pain.  Resting in a lying or standing position is often better than sitting to rest. When you rest for longer periods, mix in some mild activity or stretching between periods of rest. This will help to prevent stiffness and pain. Get regular exercise. Ask your provider what activities are safe for you. You may have to avoid lifting. Ask your provider how much you can safely lift. If you do lift, always use the right technique. This means you should: Bend your knees. Keep the load close to your body. Avoid twisting. Medicines Take over-the-counter and prescription medicines only as told by your provider. You may need to take medicines for pain and inflammation. These may be taken by mouth or put on  the skin. You may also be given muscle relaxants. Ask your provider if the medicine prescribed to you: Requires you to avoid driving or using machinery. Can cause constipation. You may need to take these actions to prevent or treat constipation: Drink enough fluid to keep your pee (urine) pale yellow. Take over-the-counter or prescription medicines. Eat foods that are high in fiber, such as beans, whole grains, and fresh fruits and vegetables. Limit foods that are high in fat and processed sugars, such as fried or sweet foods. General instructions  Sleep on a firm mattress in a comfortable position. Try lying on your side with your knees slightly bent. If you lie on your back, put a pillow under your knees. Do not use any products that contain nicotine or tobacco. These products include cigarettes, chewing tobacco, and vaping devices, such as e-cigarettes. If you need help quitting, ask your provider. Contact a health care provider if: You have pain that does not get better with rest or medicine. You have new pain. You have a fever. You lose weight quickly. You have trouble doing your normal activities. You feel weak or numb in one or both of your legs or feet. Get help right away if: You are not able  to control when you pee or poop. You have severe back pain and: Nausea or vomiting. Pain in your chest or abdomen. Shortness of breath. You faint. These symptoms may be an emergency. Get help right away. Call 911. Do not wait to see if the symptoms will go away. Do not drive yourself to the hospital. This information is not intended to replace advice given to you by your health care provider. Make sure you discuss any questions you have with your health care provider. Document Revised: 01/12/2022 Document Reviewed: 01/12/2022 Elsevier Patient Education  2024 Elsevier Inc.    Signed,   Reyes Pines, MD [facility], [facility] 03/24/24 1:13 PM

## 2024-03-31 ENCOUNTER — Ambulatory Visit: Admitting: Family Medicine

## 2024-04-04 ENCOUNTER — Other Ambulatory Visit: Payer: Self-pay | Admitting: Family Medicine

## 2024-04-04 DIAGNOSIS — R350 Frequency of micturition: Secondary | ICD-10-CM

## 2024-04-04 NOTE — Telephone Encounter (Signed)
Please advise on 90 day refill.  

## 2024-04-13 ENCOUNTER — Ambulatory Visit: Admitting: Urology

## 2024-04-13 NOTE — Progress Notes (Deleted)
 Assessment: 1. Urinary frequency   2. Urinary urgency     Plan: Continue Myrbetriq  25 mg daily.  Samples and rx given.  Bladder diet sheet given. Return to office in 2 months  Chief Complaint:  No chief complaint on file.   History of Present Illness:  Nicholas Potts is a 63 y.o. male who is seen for further evaluation of lower urinary symptoms.   At the time of his initial visit in February 2024, his symptoms had been present for at least 1 year.  He reported urinary frequency, voiding 8-10 times per day, urgency and occasional urge incontinence.  He noted some slight dysuria at the end of his void.  No nocturia.  He reported voiding with a good stream and felt like he emptied his bladder completely.  No gross hematuria.  No history of UTIs.  He has a remote history of gonorrhea 30 years ago.  He had not tried any medical therapy for his symptoms. IPSS = 5.  PVR = 0 ml.  PSA 0.74 from 1/24.  He was given a trial of Myrbetriq  25 mg daily at his visit in 2/24. At his return visit in March 2024, he noted improvement in his urinary symptoms with Myrbetriq .  He had decreased frequency and urgency and resolution of his incontinence.  No side effects from the medication.  He reported that he was voiding every 3-4 hours while on the medication.  He continues to have some slight dysuria during voiding.  No gross hematuria or flank pain. IPSS = 7.  He returns today for follow-up.  The patient's daughter interpreted during visit.  Portions of the above documentation were copied from a prior visit for review purposes only.   Past Medical History:  Past Medical History:  Diagnosis Date   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HCC) 2014   post op    Allergy 2014   Odeine   ARF (acute respiratory failure) (HCC) 2014   Blood dyscrasia    Chronic kidney disease 2014   ARF    Complication of anesthesia 2014   - post op - in combination of pain pills-  Cardiac and Respiratory Arrest- prior to  sleep apnea diagonosis   Constipation    CVA (cerebral infarction)    Hyperlipidemia    Hypertension    Myocardial infarction (HCC)    Nocturnal hypoxemia 01/15/2023   OSA (obstructive sleep apnea)    Pre-diabetes    Short-term memory loss    Sleep apnea    Stress-induced cardiomyopathy -- RESOLVED    Post-op Narcotic induced Acute Hypoxic respiratory failure with respiratory and PEA cardiac arrest following accidental narcotic overdose; initial US  20-20%, now improved to 55%   Was supported with Impella Cardiac Cath - non-obstructive CAD ; 1 month after event - EF up from 25% to 55-60% - stable x 5 yrs   Stroke [facility]) 2014   Thrombocytopenia 2014    Past Surgical History:  Past Surgical History:  Procedure Laterality Date   ACHILLES TENDON REPAIR     BACK SURGERY  10/15/2023   placed 8 screws   BIOPSY  08/01/2019   Procedure: BIOPSY;  Surgeon: Shila Gustav GAILS, MD;  Location: [facility];  Service: Endoscopy;;   BIOPSY  08/05/2020   Procedure: BIOPSY;  Surgeon: Shila Gustav GAILS, MD;  Location: [facility];  Service: Endoscopy;;   CARDIAC CATHETERIZATION  12/29/2012   RN LHC --> Impella. for severe shock with low EF. Mild to moderate RV pressure elevation. Normal coronary  arteries. Cardiac Output 2.5, Index 1.46.   COLONOSCOPY WITH PROPOFOL  N/A 10/30/2015   Procedure: COLONOSCOPY WITH PROPOFOL ;  Surgeon: Gustav Shila GAILS, MD;  Location: [facility];  Service: Endoscopy;  Laterality: N/A;   COLONOSCOPY WITH PROPOFOL  N/A 08/01/2019   Procedure: COLONOSCOPY WITH PROPOFOL ;  Surgeon: Shila Gustav GAILS, MD;  Location: [facility];  Service: Endoscopy;  Laterality: N/A;   COLONOSCOPY WITH PROPOFOL  N/A 08/05/2020   Procedure: COLONOSCOPY WITH PROPOFOL ;  Surgeon: Shila Gustav GAILS, MD;  Location: [facility];  Service: Endoscopy;  Laterality: N/A;   CORONARY ARTERY BYPASS GRAFT  2014   INSERTION OF DIALYSIS CATHETER Right 01/09/2013   Procedure: INSERTION OF DIALYSIS  CATHETER;  Surgeon: Lonni GORMAN Blade, MD;  Location: [facility] OR;  Service: Vascular;  Laterality: Right;   KNEE SURGERY Left    LAMINECTOMY WITH POSTERIOR LATERAL ARTHRODESIS LEVEL 3 N/A 10/15/2022   Procedure: THORACIC NINE-THORACIC TWELVE POSTERIOR INSTRUMENTED FUSION REDUCTION OF FRACTURE;  Surgeon: Debby Dorn MATSU, MD;  Location[facility] OR;  Service: Neurosurgery;  Laterality: N/A;   LEFT AND RIGHT HEART CATHETERIZATION WITH CORONARY ANGIOGRAM  12/29/2012   Procedure: LEFT AND RIGHT HEART CATHETERIZATION WITH CORONARY ANGIOGRAM;  Surgeon: Ozell Fell, MD;  Location[facility] CATH LAB;  Service: Cardiovascular;;   POLYPECTOMY  08/01/2019   Procedure: POLYPECTOMY;  Surgeon: Shila Gustav GAILS, MD;  [facility];  Service: Endoscopy;;   POLYPECTOMY  08/05/2020   Procedure: POLYPECTOMY;  Surgeon: Shila Gustav GAILS, MD;  [facility];  Service: Endoscopy;;   TRANSTHORACIC ECHOCARDIOGRAM  12/29/2012   EF 25% with periapical HK/AKA.  --> Followup echo 7/25 -- EF of 35%   TRANSTHORACIC ECHOCARDIOGRAM  01/2013 ; 07/2015   a) EF 55-60%. No regional WMA. Grade 1 diastolic dysfunction. Mild LA dilatation.; b) Normal LV size with mild LV hypertrophy. EF 55-60%. Normal RV   TRANSTHORACIC ECHOCARDIOGRAM  03/2018   EF 55-60%.  NO RWMA. Normal valves    Allergies:  Allergies  Allergen Reactions   Codeine Other (See Comments)    Oxygen levels drop   Oxycontin  [Oxycodone ] Other (See Comments)    Oxygen levels drop    Family History:  Family History  Problem Relation Age of Onset   Cancer Mother        Liver cancer   Diabetes Mother    Obesity Mother    Cancer Father        Leukemia   Alcohol abuse Father    Obesity Father    Cancer Maternal Grandmother        Liver cancer   Obesity Maternal Grandmother    Cancer Paternal Grandmother        Colon cancer   Colon cancer Neg Hx    Esophageal cancer Neg Hx    Stomach cancer Neg Hx    Rectal cancer Neg Hx     Social  History:  Social History   Tobacco Use   Smoking status: Former    Current packs/day: 0.00    Types: Cigarettes    Quit date: 06/20/1986    Years since quitting: 37.8   Smokeless tobacco: Never  Vaping Use   Vaping status: Never Used  Substance Use Topics   Alcohol use: Yes    Comment: History of heavy use but only social at present.  10/29/15 - a beer every week or so   Drug use: No    Comment: Prior cocaine use but none since 2000    ROS: Constitutional:  Negative for fever, chills, weight  loss CV: Negative for chest pain, previous MI, hypertension Respiratory:  Negative for shortness of breath, wheezing, sleep apnea, frequent cough GI:  Negative for nausea, vomiting, bloody stool, GERD  Physical exam: There were no vitals taken for this visit. GENERAL APPEARANCE:  Well appearing, well developed, well nourished, NAD HEENT:  Atraumatic, normocephalic, oropharynx clear NECK:  Supple without lymphadenopathy or thyromegaly ABDOMEN:  Soft, non-tender, no masses EXTREMITIES:  Moves all extremities well, without clubbing, cyanosis, or edema NEUROLOGIC:  Alert and oriented x 3, normal gait, CN II-XII grossly intact MENTAL STATUS:  appropriate BACK:  Non-tender to palpation, No CVAT SKIN:  Warm, dry, and intact  Results: U/A:

## 2024-05-11 ENCOUNTER — Ambulatory Visit: Admitting: Urology

## 2024-05-11 ENCOUNTER — Other Ambulatory Visit: Payer: Self-pay | Admitting: Family Medicine

## 2024-05-11 ENCOUNTER — Encounter: Payer: Self-pay | Admitting: Urology

## 2024-05-11 VITALS — BP 139/71 | HR 90 | Ht 67.0 in | Wt 260.0 lb

## 2024-05-11 DIAGNOSIS — S24109D Unspecified injury at unspecified level of thoracic spinal cord, subsequent encounter: Secondary | ICD-10-CM

## 2024-05-11 DIAGNOSIS — R350 Frequency of micturition: Secondary | ICD-10-CM

## 2024-05-11 DIAGNOSIS — R3915 Urgency of urination: Secondary | ICD-10-CM | POA: Diagnosis not present

## 2024-05-11 DIAGNOSIS — S22078A Other fracture of T9-T10 vertebra, initial encounter for closed fracture: Secondary | ICD-10-CM

## 2024-05-11 MED ORDER — MIRABEGRON ER 25 MG PO TB24: 25.0000 mg | ORAL_TABLET | Freq: Every day | ORAL | 3 refills | Status: AC
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
## 2024-05-12 NOTE — Telephone Encounter (Signed)
 Discussed 2 months ago in office. Continued tramadol . Controlled substance database (PDMP) reviewed. No concerns appreciated.  #30 last filled 03/24/24 - refill ordered.

## 2024-05-12 NOTE — Telephone Encounter (Signed)
 Requested Prescriptions   Pending Prescriptions Disp Refills   traMADol  (ULTRAM ) 50 MG tablet [Pharmacy Med Name: TRAMADOL  50MG  TABLETS] 30 tablet     Sig: TAKE 1 TO 2 TABLETS(50 TO 100 MG) BY MOUTH EVERY 6 HOURS     Date of patient request: 05/12/24 Last office visit: 03/24/2024 Upcoming visit: 06/09/2024 Date of last refill: 03/24/24 Last refill amount: 30

## 2024-05-24 ENCOUNTER — Other Ambulatory Visit (HOSPITAL_COMMUNITY): Payer: Self-pay

## 2024-05-24 ENCOUNTER — Telehealth: Payer: Self-pay
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
## 2024-05-24 NOTE — Telephone Encounter (Signed)
 Pharmacy Patient Advocate Encounter   Received notification from Onbase that prior authorization for traMADol  (ULTRAM ) 50 MG tablet is required/requested.   Insurance verification completed.   The patient is insured through [employer].   Was canceled Archived by pharmacy

## 2024-05-25 ENCOUNTER — Ambulatory Visit: Admitting: Internal Medicine

## 2024-05-25 ENCOUNTER — Encounter: Payer: Self-pay | Admitting: Internal Medicine

## 2024-05-25 VITALS — BP 145/68 | HR 107 | Temp 97.6°F | Ht 67.0 in | Wt 277.4 lb

## 2024-05-25 DIAGNOSIS — G4733 Obstructive sleep apnea (adult) (pediatric): Secondary | ICD-10-CM

## 2024-05-25 DIAGNOSIS — R400 Somnolence: Secondary | ICD-10-CM | POA: Diagnosis not present

## 2024-05-25 DIAGNOSIS — E669 Obesity, unspecified: Secondary | ICD-10-CM | POA: Diagnosis not present

## 2024-05-25 DIAGNOSIS — Z6841 Body Mass Index (BMI) 40.0 and over, adult: Secondary | ICD-10-CM | POA: Diagnosis not present

## 2024-05-25 MED ORDER — MODAFINIL 200 MG PO TABS: ORAL_TABLET | ORAL | 5 refills | Status: AC

## 2024-05-25 NOTE — Patient Instructions (Signed)
 Modafinil  refilled  Order- DME [facility]- please bring patient in for face to face mask refit- mask of choice, due to leak. Continue BIPAP 18/11, PS 3, supplies, humidifier, AirView/ card  At checkout, ask the front desk to bring you back in 6 months with one of our sleep doctors.

## 2024-06-09 ENCOUNTER — Ambulatory Visit: Admitting: Family Medicine

## 2024-07-14 ENCOUNTER — Ambulatory Visit (INDEPENDENT_AMBULATORY_CARE_PROVIDER_SITE_OTHER): Admission: RE | Admit: 2024-07-14 | Source: Ambulatory Visit

## 2024-07-14 ENCOUNTER — Ambulatory Visit: Admitting: Family Medicine

## 2024-07-14 ENCOUNTER — Encounter: Payer: Self-pay | Admitting: Family Medicine

## 2024-07-14 ENCOUNTER — Ambulatory Visit: Payer: Self-pay | Admitting: Family Medicine

## 2024-07-14 VITALS — BP 120/66 | HR 77 | Temp 97.9°F | Resp 18 | Ht 67.0 in | Wt 270.2 lb

## 2024-07-14 DIAGNOSIS — R7303 Prediabetes: Secondary | ICD-10-CM

## 2024-07-14 DIAGNOSIS — M25561 Pain in right knee: Secondary | ICD-10-CM

## 2024-07-14 DIAGNOSIS — S24109D Unspecified injury at unspecified level of thoracic spinal cord, subsequent encounter: Secondary | ICD-10-CM

## 2024-07-14 DIAGNOSIS — S22078A Other fracture of T9-T10 vertebra, initial encounter for closed fracture: Secondary | ICD-10-CM

## 2024-07-14 DIAGNOSIS — R7309 Other abnormal glucose: Secondary | ICD-10-CM

## 2024-07-14 DIAGNOSIS — E785 Hyperlipidemia, unspecified: Secondary | ICD-10-CM

## 2024-07-14 LAB — COMPREHENSIVE METABOLIC PANEL WITH GFR
ALT: 26 U/L (ref 3–53)
AST: 19 U/L (ref 5–37)
Albumin: 4.2 g/dL (ref 3.5–5.2)
Alkaline Phosphatase: 95 U/L (ref 39–117)
BUN: 15 mg/dL (ref 6–23)
CO2: 29 meq/L (ref 19–32)
Calcium: 9 mg/dL (ref 8.4–10.5)
Chloride: 107 meq/L (ref 96–112)
Creatinine, Ser: 0.97 mg/dL (ref 0.40–1.50)
GFR: 82.13 mL/min
Glucose, Bld: 149 mg/dL — ABNORMAL HIGH (ref 70–99)
Potassium: 4.5 meq/L (ref 3.5–5.1)
Sodium: 141 meq/L (ref 135–145)
Total Bilirubin: 0.6 mg/dL (ref 0.2–1.2)
Total Protein: 6.8 g/dL (ref 6.0–8.3)

## 2024-07-14 LAB — LIPID PANEL
Cholesterol: 179 mg/dL (ref 28–200)
HDL: 37.1 mg/dL — ABNORMAL LOW
LDL Cholesterol: 106 mg/dL — ABNORMAL HIGH (ref 10–99)
NonHDL: 142.12
Total CHOL/HDL Ratio: 5
Triglycerides: 179 mg/dL — ABNORMAL HIGH (ref 10.0–149.0)
VLDL: 35.8 mg/dL (ref 0.0–40.0)

## 2024-07-14 LAB — HEMOGLOBIN A1C: Hgb A1c MFr Bld: 6.3 % (ref 4.6–6.5)

## 2024-07-14 MED ORDER — TRAMADOL HCL 50 MG PO TABS: 50.0000 mg | ORAL_TABLET | Freq: Four times a day (QID) | ORAL | 0 refills | Status: AC

## 2024-07-14 MED ORDER — DICLOFENAC SODIUM 1 % EX GEL: 4.0000 g | Freq: Four times a day (QID) | CUTANEOUS | 1 refills | Status: AC

## 2024-07-14 MED ORDER — ATORVASTATIN CALCIUM 10 MG PO TABS: 10.0000 mg | ORAL_TABLET | Freq: Every day | ORAL | 1 refills | Status: AC

## 2024-07-14 NOTE — Progress Notes (Signed)
 "  Subjective:  Patient ID: Nicholas Potts, male    DOB: 08-22-1960  Age: 64 y.o. MRN: [id number]  CC:  Chief Complaint  Patient presents with   Follow-up    recheck back pain, labs, A1c, foot pain. Pain is traveling up to his right knee and getting worse.     HPI Nicholas Potts presents for follow up - here with dtr.   Back pain Last visit in October.  Remote thoracic vertebral fracture.  Followed by urology for urinary frequency with reassuring PSA and urinalysis in September of last year.  Treated with Myrbetriq , with recent visit December 4, stable, continued on Myrbetriq  25 mg daily with 67-month follow-up.   Tramadol  few times per week for chronic back pain, with transition of that medication to me, previous specialist was no longer following.  No change with trial of statin previously.  Back pain was stable at his October visit.  Was using tramadol  once each morning during the work week 5 days/week.  Furniture assembly.  Physical therapy had previously helped, repeat referral placed at his October visit.  Dtr had back surgery - was not able to pursue physical therapy. Similar pain. Upper back pain is same, maybe a little better with a pillow. Still taking tramadol  once per day, would like to pursue PT now.   Bilateral foot pain Also discussed at his October visit.  Pes planus bilaterally.  Did not have typical neuropathic symptoms based on description of symptoms.  Possible component of metatarsalgia, plantar fasciitis but given diffuse areas of pain I referred him to podiatry.  On review of the referral it appears that their office did try to contact patient, daughter.  Did not go to podiatrist - pain improved.   Hyperlipidemia: Lipitor 10 mg daily.  Last LDL 123 in September, but he had been off medication at that time, plan for repeat testing on meds. Lab Results  Component Value Date   CHOL 213 (H) 02/10/2024   HDL 39.30 02/10/2024   LDLCALC 123 (H) 02/10/2024    LDLDIRECT 124.0 06/12/2022   TRIG 257.0 (H) 02/10/2024   CHOLHDL 5 02/10/2024   Lab Results  Component Value Date   ALT 22 02/10/2024   AST 21 02/10/2024   ALKPHOS 99 02/10/2024   BILITOT 0.6 02/10/2024   Prediabetes: History of prediabetes with most recent A1c in September just over diabetic level at 6.6.  Diet/exercise approach recommended with particular attention to diet if difficulty with exercise due to back pain or foot issues. Wait has improved by 7#.  No home blood sugar readings.   Lab Results  Component Value Date   HGBA1C 6.6 (H) 02/10/2024   Wt Readings from Last 3 Encounters:  07/14/24 270 lb 3.2 oz (122.6 kg)  05/25/24 277 lb 6.4 oz (125.8 kg)  05/11/24 260 lb (117.9 kg)   New concern: R knee pain. Nki, noticed waking up one day. No injury. Pain 1 month, minimal swelling, hurts to bend and walk. Ok to stand. No locking/giving way.  Tx: knee brace.  Prior ARF, stress induced cardiomyopathy. No current nsaids.       History Patient Active Problem List   Diagnosis Date Noted   Dizziness 12/05/2023   Orthopnea 12/05/2023   Nocturnal hypoxemia 01/15/2023   Injury of thoracic spine (HCC) 10/22/2022   Dorsal (thoracic) vertebral fracture (HCC) 10/16/2022   Injury 10/15/2022   Urinary urgency 07/10/2022   Urinary frequency 07/10/2022   Morbid obesity with BMI of 40.0-44.9, adult (HCC) 09/10/2021  Fatigue 09/10/2021   History of colonic polyps    Polyp of ascending colon    Polyp of transverse colon    Polyp of cecum    Polyp of descending colon    Polyp of sigmoid colon    Hypersomnia 11/29/2017   Other headache syndrome 05/05/2017   Vertigo 05/05/2017   IFG (impaired fasting glucose) 08/21/2016   Obesity (BMI 30-39.9) 06/23/2013   Stress-induced cardiomyopathy -- essentially resolved    Cerebral infarction (HCC)    OSA (obstructive sleep apnea)    Hyperlipidemia with target LDL less than 100    Physical deconditioning 01/24/2013   Tinea pedis  01/18/2013   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   ARF (acute renal failure) with tubular necrosis requiring HD 01/11/2013   Anemia 01/11/2013   Thrombocytopenia, unspecified 01/11/2013   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 12/29/2012   Past Medical History:  Diagnosis Date   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HCC) 2014   post op    Allergy 2014   Odeine   ARF (acute respiratory failure) (HCC) 2014   Blood dyscrasia    Chronic kidney disease 2014   ARF    Complication of anesthesia 2014   - post op - in combination of pain pills-  Cardiac and Respiratory Arrest- prior to sleep apnea diagonosis   Constipation    CVA (cerebral infarction)    Hyperlipidemia    Hypertension    Myocardial infarction (HCC)    Nocturnal hypoxemia 01/15/2023   OSA (obstructive sleep apnea)    Pre-diabetes    Short-term memory loss    Sleep apnea    Stress-induced cardiomyopathy -- RESOLVED    Post-op Narcotic induced Acute Hypoxic respiratory failure with respiratory and PEA cardiac arrest following accidental narcotic overdose; initial US  20-20%, now improved to 55%   Was supported with Impella Cardiac Cath - non-obstructive CAD ; 1 month after event - EF up from 25% to 55-60% - stable x 5 yrs   Stroke [facility]) 2014   Thrombocytopenia 2014   Past Surgical History:  Procedure Laterality Date   ACHILLES TENDON REPAIR     BACK SURGERY  10/15/2023   placed 8 screws   BIOPSY  08/01/2019   Procedure: BIOPSY;  Surgeon: Shila Gustav GAILS, MD;  Location: [facility];  Service: Endoscopy;;   BIOPSY  08/05/2020   Procedure: BIOPSY;  Surgeon: Shila Gustav GAILS, MD;  Location: [facility];  Service: Endoscopy;;   CARDIAC CATHETERIZATION  12/29/2012   RN LHC --> Impella. for severe shock with low EF. Mild to moderate RV pressure elevation. Normal coronary arteries. Cardiac Output 2.5, Index 1.46.   COLONOSCOPY WITH PROPOFOL  N/A 10/30/2015    Procedure: COLONOSCOPY WITH PROPOFOL ;  Surgeon: Gustav Shila GAILS, MD;  Location: [facility];  Service: Endoscopy;  Laterality: N/A;   COLONOSCOPY WITH PROPOFOL  N/A 08/01/2019   Procedure: COLONOSCOPY WITH PROPOFOL ;  Surgeon: Shila Gustav GAILS, MD;  Location: [facility];  Service: Endoscopy;  Laterality: N/A;   COLONOSCOPY WITH PROPOFOL  N/A 08/05/2020   Procedure: COLONOSCOPY WITH PROPOFOL ;  Surgeon: Shila Gustav GAILS, MD;  Location: [facility];  Service: Endoscopy;  Laterality: N/A;   CORONARY ARTERY BYPASS GRAFT  2014   INSERTION OF DIALYSIS CATHETER Right 01/09/2013   Procedure: INSERTION OF DIALYSIS CATHETER;  Surgeon: Lonni GORMAN Blade, MD;  Location: [facility] OR;  Service: Vascular;  Laterality: Right;   KNEE SURGERY Left    LAMINECTOMY WITH POSTERIOR LATERAL ARTHRODESIS LEVEL 3 N/A  10/15/2022   Procedure: THORACIC NINE-THORACIC TWELVE POSTERIOR INSTRUMENTED FUSION REDUCTION OF FRACTURE;  Surgeon: Debby Dorn MATSU, MD;  Location: [facility] OR;  Service: Neurosurgery;  Laterality: N/A;   LEFT AND RIGHT HEART CATHETERIZATION WITH CORONARY ANGIOGRAM  12/29/2012   Procedure: LEFT AND RIGHT HEART CATHETERIZATION WITH CORONARY ANGIOGRAM;  Surgeon: Ozell Fell, MD;  Location[facility];  Service: Cardiovascular;;   POLYPECTOMY  08/01/2019   Procedure: POLYPECTOMY;  Surgeon: Shila Gustav GAILS, MD;  Location: [facility];  Service: Endoscopy;;   POLYPECTOMY  08/05/2020   Procedure: POLYPECTOMY;  Surgeon: Shila Gustav GAILS, MD;  Location: [facility];  Service: Endoscopy;;   TRANSTHORACIC ECHOCARDIOGRAM  12/29/2012   EF 25% with periapical HK/AKA.  --> Followup echo 7/25 -- EF of 35%   TRANSTHORACIC ECHOCARDIOGRAM  01/2013 ; 07/2015   a) EF 55-60%. No regional WMA. Grade 1 diastolic dysfunction. Mild LA dilatation.; b) Normal LV size with mild LV hypertrophy. EF 55-60%. Normal RV   TRANSTHORACIC ECHOCARDIOGRAM  03/2018   EF 55-60%.  NO RWMA. Normal valves   Allergies[1] Prior to  Admission medications  Medication Sig Start Date End Date Taking? Authorizing Provider  acetaminophen  (TYLENOL ) 325 MG tablet Take 1-2 tablets (325-650 mg total) by mouth every 4 (four) hours as needed for mild pain. 10/30/22   Setzer, Nena PARAS, PA-C  atorvastatin  (LIPITOR) 10 MG tablet Take 1 tablet (10 mg total) by mouth at bedtime. 02/10/24   Levora Reyes SAUNDERS, MD  mirabegron  ER (MYRBETRIQ ) 25 MG TB24 tablet Take 1 tablet (25 mg total) by mouth daily. 05/11/24   Stoneking, Adine PARAS., MD  modafinil  (PROVIGIL ) 200 MG tablet 1-2 tabs daily as needed for sleepiness 05/25/24   Neysa Rama D, MD  traMADol  (ULTRAM ) 50 MG tablet Take 1-2 tablets (50-100 mg total) by mouth every 6 (six) hours. 05/12/24   Levora Reyes SAUNDERS, MD   Social History   Socioeconomic History   Marital status: Widowed    Spouse name: Not on file   Number of children: Not on file   Years of education: Not on file   Highest education level: 9th grade  Occupational History   Not on file  Tobacco Use   Smoking status: Former    Current packs/day: 0.00    Types: Cigarettes    Quit date: 06/20/1986    Years since quitting: 38.0   Smokeless tobacco: Never  Vaping Use   Vaping status: Never Used  Substance and Sexual Activity   Alcohol use: Yes    Comment: History of heavy use but only social at present.  10/29/15 - a beer every week or so   Drug use: No    Comment: Prior cocaine use but none since 2000   Sexual activity: Not Currently  Other Topics Concern   Not on file  Social History Narrative   He is widowed for 13 years, and has 2 daughters: Hadassah Louetta Marla Andrez & [address] [place], and one son Baldemar Dady. the other history is daughter who is here with him today.    He works for [employer] and [employer] as a [employer].   He quit smoking in 1988. He does drink alcohol socially, but he does have a history of heavy use in the past. He also has a prior history of cocaine use but none since 2000.    Social Drivers of Health   Tobacco Use: Medium Risk (07/14/2024)   Patient History    Smoking Tobacco Use: Former  Smokeless Tobacco Use: Never    Passive Exposure: Not on file  Financial Resource Strain: Low Risk (02/10/2024)   Overall Financial Resource Strain (CARDIA)    Difficulty of Paying Living Expenses: Not very hard  Food Insecurity: No Food Insecurity (02/10/2024)   Epic    Worried About Programme Researcher, Broadcasting/film/video in the Last Year: Never true    Ran Out of Food in the Last Year: Never true  Transportation Needs: No Transportation Needs (02/10/2024)   Epic    Lack of Transportation (Medical): No    Lack of Transportation (Non-Medical): No  Physical Activity: Insufficiently Active (02/10/2024)   Exercise Vital Sign    Days of Exercise per Week: 1 day    Minutes of Exercise per Session: 20 min  Stress: No Stress Concern Present (02/10/2024)   [employer] of Occupational Health - Occupational Stress Questionnaire    Feeling of Stress: Only a little  Social Connections: Moderately Integrated (02/10/2024)   Social Connection and Isolation Panel    Frequency of Communication with Friends and Family: More than three times a week    Frequency of Social Gatherings with Friends and Family: Three times a week    Attends Religious Services: More than 4 times per year    Active Member of Clubs or Organizations: Yes    Attends Banker Meetings: More than 4 times per year    Marital Status: Widowed  Intimate Partner Violence: Not on file  Depression (PHQ2-9): Medium Risk (02/10/2024)   Depression (PHQ2-9)    PHQ-2 Score: 6  Alcohol Screen: Low Risk (02/10/2024)   Alcohol Screen    Last Alcohol Screening Score (AUDIT): 2  Housing: Low Risk (02/10/2024)   Epic    Unable to Pay for Housing in the Last Year: No    Number of Times Moved in the Last Year: 0    Homeless in the Last Year: No  Utilities: Not on file  Health Literacy: Not on file    Review of Systems   Objective:    Vitals:   07/14/24 0845  BP: 120/66  Pulse: 77  Resp: 18  Temp: 97.9 F (36.6 C)  TempSrc: Temporal  SpO2: 99%  Weight: 270 lb 3.2 oz (122.6 kg)  Height: 5' 7 (1.702 m)     Physical Exam Vitals reviewed.  Constitutional:      Appearance: He is well-developed.  HENT:     Head: Normocephalic and atraumatic.  Neck:     Vascular: No carotid bruit or JVD.  Cardiovascular:     Rate and Rhythm: Normal rate and regular rhythm.     Heart sounds: Normal heart sounds. No murmur heard. Pulmonary:     Effort: Pulmonary effort is normal.     Breath sounds: Normal breath sounds. No rales.  Musculoskeletal:     Right lower leg: No edema.     Left lower leg: No edema.     Comments: No midline bony tenderness of thoracic spine.  Right knee, possible slight effusion, difficult exam with body habitus.  Tender along medial joint line.  Intact range of motion.  Discomfort medially with motion, [place].  Skin:    General: Skin is warm and dry.  Neurological:     Mental Status: He is alert and oriented to person, place, and time.  Psychiatric:        Mood and Affect: Mood normal.        Assessment & Plan:  Nicholas Potts is a 64 y.o.  male . Acute pain of right knee - Plan: diclofenac  Sodium (VOLTAREN ) 1 % GEL, DG Knee Complete 4 Views Right  - New concern, medial joint line pain, suspected degenerative joint disease.  No prior injury/surgery of right knee but has had surgery for left knee.  Will check imaging, trial of topical diclofenac , has brace at home.  Update on symptoms in the next week or 2 and if not improving consider Ortho eval.  Injury of thoracic spine, subsequent encounter (HCC) - Plan: traMADol  (ULTRAM ) 50 MG tablet  - Plan for follow-up with physical therapy as planned last visit.  Overall stable.  New referral can be placed if needed but they will contact prior location/referral first.  If not improving with physical therapy would recommend follow-up with back  specialist again.  Will continue tramadol  for now.  Prediabetes - Plan: Hemoglobin A1c Elevated hemoglobin A1c - Plan: Hemoglobin A1c  - Weight has improved as above.  Check updated A1c.  Hyperlipidemia, unspecified hyperlipidemia type - Plan: Lipid panel, Comprehensive metabolic panel with GFR, atorvastatin  (LIPITOR) 10 MG tablet  - Tolerating Lipitor nightly.  Check updated labs.  Other closed fracture of ninth thoracic vertebra, initial encounter (HCC) - Plan: traMADol  (ULTRAM ) 50 MG tablet  - As above trial of PT, tramadol  refilled, consider follow-up with specialist if persistent or worsening symptoms.  Meds ordered this encounter  Medications   diclofenac  Sodium (VOLTAREN ) 1 % GEL    Sig: Apply 4 g topically 4 (four) times daily.    Dispense:  150 g    Refill:  1   atorvastatin  (LIPITOR) 10 MG tablet    Sig: Take 1 tablet (10 mg total) by mouth at bedtime.    Dispense:  90 tablet    Refill:  1   traMADol  (ULTRAM ) 50 MG tablet    Sig: Take 1-2 tablets (50-100 mg total) by mouth every 6 (six) hours.    Dispense:  30 tablet    Refill:  0   Patient Instructions  Glad to hear the back pain is stable but I do think physical therapy could be helpful.  Tramadol  is okay for now.  Try calling outpatient rehab as that was the location we sent a referral in October.  That may still be effective.  If they need a new referral have them let us  know.  Glad to hear the foot pain has improved.  If any return of foot pain I would recommend follow-up with podiatry as we previously recommended.  Let me know.  I will check some updated labs today.  Cholesterol should improve with restart of atorvastatin , and checking her blood sugar as it was at diabetes level last visit.  Please have xray of knee at location below. Ok to use brace.   [facility] or xray: Walk in 8:30-4:30 during weekdays, no appointment needed [address]  [place], [place] [address]     Signed,   Reyes Pines,  MD [facility], [facility] 07/14/24 9:19 AM       [1]  Allergies Allergen Reactions   Codeine Other (See Comments)    Oxygen levels drop   Oxycontin  [Oxycodone ] Other (See Comments)    Oxygen levels drop   "

## 2024-07-14 NOTE — Patient Instructions (Addendum)
 Glad to hear the back pain is stable but I do think physical therapy could be helpful.  Tramadol  is okay for now.  Try calling outpatient rehab as that was the location we sent a referral in October.  That may still be effective.  If they need a new referral have them let us  know.  If physical therapy does not have back pain I would like you to meet with another back specialist.  Glad to hear the foot pain has improved.  If any return of foot pain I would recommend follow-up with podiatry as we previously recommended.  Let me know.  I will check some updated labs today.  Cholesterol should improve with restart of atorvastatin , and checking her blood sugar as it was at diabetes level last visit.  Please have xray of knee at location below. Ok to use brace.  Diclofenac  gel, or can purchase that over-the-counter if not covered by prescription.  Let me know how your knee is feeling over the next week or 2 and if not improving I would like you to meet with an orthopedist and I will place that referral.  [facility] or xray: Walk in 8:30-4:30 during weekdays, no appointment needed [address]  [place], [place] [address]

## 2024-07-25 ENCOUNTER — Ambulatory Visit: Admitting: Neurology

## 2024-08-11 ENCOUNTER — Ambulatory Visit: Admitting: Family Medicine

## 2024-10-13 ENCOUNTER — Ambulatory Visit: Admitting: Family Medicine

## 2024-11-09 ENCOUNTER — Ambulatory Visit: Admitting: Urology

## 2024-11-23 ENCOUNTER — Encounter: Admitting: Pulmonary Disease
